# Patient Record
Sex: Female | Born: 1950 | Race: Black or African American | Hispanic: No | Marital: Married | Smoking: Former smoker
Health system: Southern US, Community
[De-identification: ages and names within clinical notes are randomized; demographics above are authoritative.]

## PROBLEM LIST (undated history)

## (undated) DIAGNOSIS — M1990 Unspecified osteoarthritis, unspecified site: Secondary | ICD-10-CM

## (undated) DIAGNOSIS — I82409 Acute embolism and thrombosis of unspecified deep veins of unspecified lower extremity: Secondary | ICD-10-CM

## (undated) DIAGNOSIS — C50919 Malignant neoplasm of unspecified site of unspecified female breast: Secondary | ICD-10-CM

## (undated) DIAGNOSIS — R0600 Dyspnea, unspecified: Secondary | ICD-10-CM

## (undated) DIAGNOSIS — E119 Type 2 diabetes mellitus without complications: Secondary | ICD-10-CM

## (undated) DIAGNOSIS — I10 Essential (primary) hypertension: Secondary | ICD-10-CM

## (undated) DIAGNOSIS — I4892 Unspecified atrial flutter: Secondary | ICD-10-CM

## (undated) DIAGNOSIS — Z8049 Family history of malignant neoplasm of other genital organs: Secondary | ICD-10-CM

## (undated) DIAGNOSIS — Z800 Family history of malignant neoplasm of digestive organs: Secondary | ICD-10-CM

## (undated) DIAGNOSIS — D649 Anemia, unspecified: Secondary | ICD-10-CM

## (undated) HISTORY — DX: Type 2 diabetes mellitus without complications: E11.9

## (undated) HISTORY — PX: TUBAL LIGATION: SHX77

## (undated) HISTORY — PX: CATARACT EXTRACTION W/ INTRAOCULAR LENS IMPLANT: SHX1309

## (undated) HISTORY — DX: Family history of malignant neoplasm of other genital organs: Z80.49

## (undated) HISTORY — DX: Essential (primary) hypertension: I10

## (undated) HISTORY — DX: Family history of malignant neoplasm of digestive organs: Z80.0

---

## 1999-08-18 ENCOUNTER — Encounter: Payer: Self-pay | Admitting: Family Medicine

## 1999-08-18 ENCOUNTER — Ambulatory Visit (HOSPITAL_COMMUNITY): Admission: RE | Admit: 1999-08-18 | Discharge: 1999-08-18 | Payer: Self-pay | Admitting: Family Medicine

## 2001-10-10 ENCOUNTER — Encounter: Payer: Self-pay | Admitting: Family Medicine

## 2001-10-10 ENCOUNTER — Ambulatory Visit (HOSPITAL_COMMUNITY): Admission: RE | Admit: 2001-10-10 | Discharge: 2001-10-10 | Payer: Self-pay | Admitting: Family Medicine

## 2004-10-24 ENCOUNTER — Emergency Department (HOSPITAL_COMMUNITY): Admission: EM | Admit: 2004-10-24 | Discharge: 2004-10-24 | Payer: Self-pay | Admitting: Emergency Medicine

## 2012-12-05 ENCOUNTER — Other Ambulatory Visit (HOSPITAL_COMMUNITY): Payer: Self-pay | Admitting: Family Medicine

## 2012-12-05 ENCOUNTER — Ambulatory Visit (HOSPITAL_COMMUNITY)
Admission: RE | Admit: 2012-12-05 | Discharge: 2012-12-05 | Disposition: A | Discharge status: Home / Self Care | Payer: BC Managed Care – PPO | Source: Ambulatory Visit | Attending: Family Medicine | Admitting: Family Medicine

## 2012-12-05 DIAGNOSIS — R52 Pain, unspecified: Secondary | ICD-10-CM

## 2012-12-05 DIAGNOSIS — M79609 Pain in unspecified limb: Secondary | ICD-10-CM | POA: Insufficient documentation

## 2012-12-05 DIAGNOSIS — M7730 Calcaneal spur, unspecified foot: Secondary | ICD-10-CM | POA: Insufficient documentation

## 2013-09-30 ENCOUNTER — Emergency Department (HOSPITAL_COMMUNITY): Payer: BC Managed Care – PPO

## 2013-09-30 ENCOUNTER — Encounter (HOSPITAL_COMMUNITY): Payer: Self-pay | Admitting: Emergency Medicine

## 2013-09-30 ENCOUNTER — Emergency Department (HOSPITAL_COMMUNITY)
Admission: EM | Admit: 2013-09-30 | Discharge: 2013-09-30 | Disposition: A | Discharge status: Home / Self Care | Payer: BC Managed Care – PPO | Attending: Emergency Medicine | Admitting: Emergency Medicine

## 2013-09-30 DIAGNOSIS — E119 Type 2 diabetes mellitus without complications: Secondary | ICD-10-CM | POA: Insufficient documentation

## 2013-09-30 DIAGNOSIS — R61 Generalized hyperhidrosis: Secondary | ICD-10-CM | POA: Insufficient documentation

## 2013-09-30 DIAGNOSIS — I10 Essential (primary) hypertension: Secondary | ICD-10-CM | POA: Insufficient documentation

## 2013-09-30 DIAGNOSIS — R072 Precordial pain: Secondary | ICD-10-CM | POA: Insufficient documentation

## 2013-09-30 DIAGNOSIS — R079 Chest pain, unspecified: Secondary | ICD-10-CM

## 2013-09-30 DIAGNOSIS — R0602 Shortness of breath: Secondary | ICD-10-CM | POA: Insufficient documentation

## 2013-09-30 LAB — CBC WITH DIFFERENTIAL/PLATELET
Basophils Absolute: 0 10*3/uL (ref 0.0–0.1)
Basophils Relative: 0 % (ref 0–1)
Eosinophils Absolute: 0.1 10*3/uL (ref 0.0–0.7)
Eosinophils Relative: 2 % (ref 0–5)
HCT: 36.6 % (ref 36.0–46.0)
Hemoglobin: 12.4 g/dL (ref 12.0–15.0)
Lymphocytes Relative: 22 % (ref 12–46)
Lymphs Abs: 1.6 10*3/uL (ref 0.7–4.0)
MCH: 28.2 pg (ref 26.0–34.0)
MCHC: 33.9 g/dL (ref 30.0–36.0)
MCV: 83.4 fL (ref 78.0–100.0)
Monocytes Absolute: 0.6 10*3/uL (ref 0.1–1.0)
Monocytes Relative: 9 % (ref 3–12)
NEUTROS ABS: 4.8 10*3/uL (ref 1.7–7.7)
NEUTROS PCT: 67 % (ref 43–77)
Platelets: 255 10*3/uL (ref 150–400)
RBC: 4.39 MIL/uL (ref 3.87–5.11)
RDW: 12.9 % (ref 11.5–15.5)
WBC: 7.2 10*3/uL (ref 4.0–10.5)

## 2013-09-30 LAB — BASIC METABOLIC PANEL
BUN: 27 mg/dL — ABNORMAL HIGH (ref 6–23)
CO2: 29 meq/L (ref 19–32)
Calcium: 9.6 mg/dL (ref 8.4–10.5)
Chloride: 98 mEq/L (ref 96–112)
Creatinine, Ser: 0.67 mg/dL (ref 0.50–1.10)
GFR calc Af Amer: >90 mL/min (ref >90)
GFR calc non Af Amer: >90 mL/min (ref >90)
GLUCOSE: 103 mg/dL — AB (ref 70–99)
Potassium: 3.2 mEq/L — ABNORMAL LOW (ref 3.7–5.3)
Sodium: 140 mEq/L (ref 137–147)

## 2013-09-30 LAB — TROPONIN I: Troponin I: <0.3 ng/mL (ref <0.30)

## 2013-09-30 MED ORDER — CYCLOBENZAPRINE HCL 10 MG PO TABS
10.0000 mg | ORAL_TABLET | Freq: Once | ORAL | Status: AC
  Administered 2013-09-30: 10 mg via ORAL
  Filled 2013-09-30: qty 1

## 2013-09-30 MED ORDER — POTASSIUM CHLORIDE CRYS ER 20 MEQ PO TBCR
20.0000 meq | EXTENDED_RELEASE_TABLET | Freq: Once | ORAL | Status: AC
  Administered 2013-09-30: 20 meq via ORAL
  Filled 2013-09-30: qty 1

## 2013-09-30 MED ORDER — NAPROXEN 500 MG PO TABS: 500.0000 mg | ORAL_TABLET | Freq: Two times a day (BID) | ORAL | Status: DC

## 2013-09-30 MED ORDER — CYCLOBENZAPRINE HCL 10 MG PO TABS: 10.0000 mg | ORAL_TABLET | Freq: Two times a day (BID) | ORAL | Status: DC | PRN

## 2013-09-30 MED ORDER — NAPROXEN 250 MG PO TABS
500.0000 mg | ORAL_TABLET | Freq: Once | ORAL | Status: AC
  Administered 2013-09-30: 500 mg via ORAL
  Filled 2013-09-30: qty 2

## 2013-09-30 NOTE — ED Notes (Signed)
Pt c/o chest pain for past few days.  Says breathing or coughing makes the pain worse.  Pt says thought may have been indigestion because pain is intermittent.  Pt says has been taking antacids and it "eases" the pain a little.  Pt says today pain is constant and is worse if she lays down.  Denies n/v.  Reports sob with exertion and c/o feeling tired lately.

## 2013-09-30 NOTE — Discharge Instructions (Signed)
Medication for pain and muscle spasm. Followup your primary care Dr. this week.

## 2013-09-30 NOTE — ED Provider Notes (Signed)
CSN: [id number]     Arrival date & time 09/30/13  1741 History  This chart was scribed for Crystal Christen, MD by Jenne Campus, ED Scribe. This patient was seen in room APA06/APA06 and the patient's care was started at 7:23 PM.   Chief Complaint  Patient presents with  . Chest Pain     The history is provided by the patient. No language interpreter was used.    [name] is a 63 y.o. female who presents to the Emergency Department complaining of mid sternal CP that radiates straight through to the back intermittently for the past several days. The pain became constant today causing her concern. [name]   PCP is Dr. Karie Kirks.   Past Medical History  Diagnosis Date  . Diabetes mellitus without complication   . Hypertension    History reviewed. No pertinent past surgical history. No family history on file. History  Substance Use Topics  . Smoking status: Never Smoker   . Smokeless tobacco: Not on file  . Alcohol Use: No   No OB history provided.  Review of Systems  A complete 10 system review of systems was obtained and all systems are negative except as noted in the HPI and PMH.    Allergies  Review of patient's allergies indicates no known allergies.  Home Medications   Prior to Admission medications   Not on File   Triage Vitals: BP 102/84  Pulse 97  Temp(Src) 98 F (36.7 C) (Oral)  Resp 20  Ht 5\' 3"  (1.6 m)  Wt 300 lb (136.079 kg)  BMI 53.16 kg/m2  SpO2 100%  Physical Exam  Nursing note and vitals reviewed. Constitutional: She is oriented to person, place, and time. She appears well-developed and well-nourished.  HENT:  Head: Normocephalic and  atraumatic.  Eyes: Conjunctivae and EOM are normal. Pupils are equal, round, and reactive to light.  Neck: Normal range of motion. Neck supple.  Cardiovascular: Normal rate, regular rhythm and normal heart sounds.   Pulmonary/Chest: Effort normal and breath sounds normal. She exhibits tenderness (mild tenderness to the sternum).  Abdominal: Soft. Bowel sounds are normal.  Musculoskeletal: Normal range of motion.  Neurological: She is alert and oriented to person, place, and time.  Skin: Skin is warm and dry.  Psychiatric: She has a normal mood and affect. Her behavior is normal.    ED Course  Procedures (including critical care time)  DIAGNOSTIC STUDIES: Oxygen Saturation is 100% on RA, normal by my interpretation.    COORDINATION OF CARE: 7:25 PM-Discussed treatment plan which includes CXR, CXC, BMP and troponin with pt at bedside and pt agreed to plan.   7:45 PM- Pt noted to by hypokalemic, potassium tablet ordered.   Labs Review Labs Reviewed  BASIC METABOLIC PANEL - Abnormal; Notable for the following:    Potassium 3.2 (*)    Glucose, Bld 103 (*)    BUN 27 (*)    All other components within normal limits  CBC WITH DIFFERENTIAL  TROPONIN I    Imaging Review Dg Chest 2 View  09/30/2013   CLINICAL DATA:  Chest pain.  EXAM: CHEST  2 VIEW  COMPARISON:  10/24/2004.  FINDINGS: The heart is mildly enlarged but stable. There is tortuosity and ectasia of the thoracic aorta. The lungs are clear. Stable mild elevation of the right hemidiaphragm. The bony structures are intact.  IMPRESSION: No acute cardiopulmonary findings.   Electronically Signed   By: Kalman Jewels M.D.   On: 09/30/2013 18:18     EKG Interpretation None      Date: 09/30/2013  Rate: 96  Rhythm: normal sinus rhythm  QRS Axis: normal  Intervals: normal  ST/T Wave abnormalities: normal  Conduction Disutrbances: none  Narrative Interpretation: unremarkable    MDM   Final diagnoses:  Chest pain    screening tests including EKG, troponin, hemoglobin, chest x-ray all negative. Pain is worse with palpation and deep breaths.   Discharge medications Naprosyn 500 mg and Flexeril 10 mg. Patient has primary care followup.  I personally performed the services described in this documentation, which was scribed in my presence. The recorded information has been reviewed and is accurate.      Crystal Christen, MD 09/30/13 2116

## 2013-10-10 ENCOUNTER — Ambulatory Visit (INDEPENDENT_AMBULATORY_CARE_PROVIDER_SITE_OTHER): Payer: BC Managed Care – PPO | Admitting: Cardiology

## 2013-10-10 ENCOUNTER — Encounter: Payer: Self-pay | Admitting: Cardiology

## 2013-10-10 VITALS — BP 124/77 | HR 85 | Ht 63.0 in | Wt 294.1 lb

## 2013-10-10 DIAGNOSIS — E119 Type 2 diabetes mellitus without complications: Secondary | ICD-10-CM | POA: Insufficient documentation

## 2013-10-10 DIAGNOSIS — I10 Essential (primary) hypertension: Secondary | ICD-10-CM | POA: Insufficient documentation

## 2013-10-10 DIAGNOSIS — R0789 Other chest pain: Secondary | ICD-10-CM | POA: Insufficient documentation

## 2013-10-10 NOTE — Assessment & Plan Note (Signed)
Description of symptoms very atypical for ischemic heart disease as etiology. Seems to point more toward an inflammatory etiology. Her ECG is abnormal at baseline, although poor R. progression may be related to body habitus. Plan at this point is to obtain a complete echocardiogram to assess cardiac structure and function as well as rule out pericardial effusion. As her symptoms are resolving on current therapy, will hold off on stress testing for now. This could certainly be considered in the future however if symptoms recur or are more typical - would probably obtain a 2 day Lexiscan Cardiolite in that case.

## 2013-10-10 NOTE — Progress Notes (Signed)
Clinical Summary Ms. Komatsu is a 63 y.o.female referred for cardiology consultation by Dr. Karie Kirks. Record review finds recent ER visits secondary to chest pain - described as being worse when supine and also taking a deep breath. ER note indicates mild tenderness on palpation of the mid sternal area. Cardiac enzymes were negative, arguing against ACS. Chest x-ray demonstrated mild stable cardiomegaly, no acute process. She was discharged, treated with Naprosyn and Flexeril. She states that these medications have helped her symptoms significantly, although they have not yet completely resolved.  Recent ECG in early May showed sinus rhythm with leftward axis and poor R-wave progression, rule out old anterior infarct pattern. Lab work reviewed finding hemoglobin 12.4, platelets 255, potassium 3.2, BUN 27, creatinine 0.67, troponin I less than 0.30.  Further discussion finds occasional reflux symptoms, no frank dysphagia. She reports no exertional chest discomfort or breathlessness. She works at [employer] and is on her feet a lot today.  Followup ECG today shows sinus rhythm with low voltage and poor R-wave progression, rule out old anterior infarct pattern. We discussed her symptoms which are fairly atypical for ischemic heart disease, also her baseline cardiovascular risk factors.   No Known Allergies  Current Outpatient Prescriptions  Medication Sig Dispense Refill  . aspirin 81 MG tablet Take 81 mg by mouth daily.      . cyclobenzaprine (FLEXERIL) 10 MG tablet Take 1 tablet (10 mg total) by mouth 2 (two) times daily as needed for muscle spasms.  20 tablet  0  . hydrochlorothiazide (HYDRODIURIL) 25 MG tablet Take 25 mg by mouth daily.      Marland Kitchen ibuprofen (ADVIL,MOTRIN) 800 MG tablet Take 800 mg by mouth every 8 (eight) hours as needed (for leg pain).      Marland Kitchen losartan (COZAAR) 50 MG tablet Take 50 mg by mouth daily.      . metFORMIN (GLUCOPHAGE) 500 MG tablet Take by mouth 2 (two) times  daily.      . naproxen (NAPROSYN) 500 MG tablet Take 1 tablet (500 mg total) by mouth 2 (two) times daily.  20 tablet  0  . potassium chloride SA (K-DUR,KLOR-CON) 20 MEQ tablet Take 20 mEq by mouth daily.       No current facility-administered medications for this visit.    Past Medical History  Diagnosis Date  . Type 2 diabetes mellitus   . Essential hypertension, benign     Past Surgical History  Procedure Laterality Date  . Tubal ligation      Family History  Problem Relation Age of Onset  . Diabetes Mellitus II Father   . Colon cancer Mother     Social History Ms. Darty reports that she has never smoked. She does not have any smokeless tobacco history on file. Ms. Croghan reports that she does not drink alcohol.  Review of Systems No palpitations, dizziness, syncope. No orthopnea or PND. No claudication. Otherwise as outlined.  Physical Examination Filed Vitals:   10/10/13 0948  BP: 124/77  Pulse: 85   Filed Weights   10/10/13 0948  Weight: 294 lb 1.9 oz (133.412 kg)   Morbidly obese woman, appears comfortable at rest. HEENT: Conjunctiva and lids normal, oropharynx clear. Neck: Supple, no elevated JVP or carotid bruits, no thyromegaly. Lungs: Clear to auscultation, nonlabored breathing at rest. Cardiac: Regular rate and rhythm, no S3 or significant systolic murmur, no pericardial rub. Abdomen: Soft, nontender, bowel sounds present, no guarding or rebound. Extremities: No pitting edema, distal pulses 2+. Skin:  Warm and dry. Musculoskeletal: No kyphosis. Neuropsychiatric: Alert and oriented x3, affect grossly appropriate.   Problem List and Plan   Atypical chest pain Description of symptoms very atypical for ischemic heart disease as etiology. Seems to point more toward an inflammatory etiology. Her ECG is abnormal at baseline, although poor R. progression may be related to body habitus. Plan at this point is to obtain a complete echocardiogram to assess  cardiac structure and function as well as rule out pericardial effusion. As her symptoms are resolving on current therapy, will hold off on stress testing for now. This could certainly be considered in the future however if symptoms recur or are more typical - would probably obtain a 2 day Lexiscan Cardiolite in that case.  Essential hypertension, benign Blood pressure is well-controlled today.  Morbid obesity She states that she is trying to lose weight through portion size control.  Type 2 diabetes mellitus Keep followup with Dr. Karie Kirks.    Satira Sark, M.D., F.A.C.C.

## 2013-10-10 NOTE — Assessment & Plan Note (Signed)
Keep followup with Dr. Karie Kirks.

## 2013-10-10 NOTE — Assessment & Plan Note (Signed)
She states that she is trying to lose weight through portion size control.

## 2013-10-10 NOTE — Patient Instructions (Signed)

## 2013-10-10 NOTE — Assessment & Plan Note (Signed)
Blood pressure is well-controlled today. 

## 2013-10-17 ENCOUNTER — Other Ambulatory Visit: Payer: BC Managed Care – PPO

## 2013-10-26 ENCOUNTER — Other Ambulatory Visit (HOSPITAL_COMMUNITY): Payer: Self-pay | Admitting: *Deleted

## 2013-10-26 DIAGNOSIS — R079 Chest pain, unspecified: Secondary | ICD-10-CM

## 2013-10-31 ENCOUNTER — Other Ambulatory Visit: Payer: Self-pay

## 2013-10-31 ENCOUNTER — Other Ambulatory Visit (INDEPENDENT_AMBULATORY_CARE_PROVIDER_SITE_OTHER): Payer: BC Managed Care – PPO

## 2013-10-31 DIAGNOSIS — R079 Chest pain, unspecified: Secondary | ICD-10-CM

## 2013-11-01 ENCOUNTER — Telehealth: Payer: Self-pay | Admitting: *Deleted

## 2013-11-01 NOTE — Telephone Encounter (Signed)
Patient informed. 

## 2013-11-01 NOTE — Telephone Encounter (Signed)
Message copied by Merlene Laughter on Thu Nov 01, 2013 10:29 AM ------      Message from: MCDOWELL, Aloha Gell      Created: Wed Oct 31, 2013  2:25 PM       Reviewed. Normal LV systolic function, no wall motion abnormalities, and no pericardial effusion. As per recent office visit note, no further cardiac testing planned unless she continues to have chest pain. ------

## 2014-03-15 ENCOUNTER — Emergency Department (HOSPITAL_COMMUNITY): Payer: BC Managed Care – PPO

## 2014-03-15 ENCOUNTER — Emergency Department (HOSPITAL_COMMUNITY)
Admission: EM | Admit: 2014-03-15 | Discharge: 2014-03-16 | Disposition: A | Discharge status: Home / Self Care | Payer: BC Managed Care – PPO | Attending: Emergency Medicine | Admitting: Emergency Medicine

## 2014-03-15 ENCOUNTER — Encounter (HOSPITAL_COMMUNITY): Payer: Self-pay | Admitting: Emergency Medicine

## 2014-03-15 DIAGNOSIS — Z9851 Tubal ligation status: Secondary | ICD-10-CM | POA: Insufficient documentation

## 2014-03-15 DIAGNOSIS — N858 Other specified noninflammatory disorders of uterus: Secondary | ICD-10-CM

## 2014-03-15 DIAGNOSIS — N9489 Other specified conditions associated with female genital organs and menstrual cycle: Secondary | ICD-10-CM | POA: Insufficient documentation

## 2014-03-15 DIAGNOSIS — N939 Abnormal uterine and vaginal bleeding, unspecified: Secondary | ICD-10-CM | POA: Insufficient documentation

## 2014-03-15 DIAGNOSIS — I10 Essential (primary) hypertension: Secondary | ICD-10-CM | POA: Insufficient documentation

## 2014-03-15 DIAGNOSIS — E119 Type 2 diabetes mellitus without complications: Secondary | ICD-10-CM | POA: Insufficient documentation

## 2014-03-15 DIAGNOSIS — Z79899 Other long term (current) drug therapy: Secondary | ICD-10-CM | POA: Insufficient documentation

## 2014-03-15 LAB — CBC WITH DIFFERENTIAL/PLATELET
Basophils Absolute: 0 K/uL (ref 0.0–0.1)
Basophils Relative: 0 % (ref 0–1)
Eosinophils Absolute: 0.1 K/uL (ref 0.0–0.7)
Eosinophils Relative: 1 % (ref 0–5)
HCT: 34 % — ABNORMAL LOW (ref 36.0–46.0)
Hemoglobin: 11.6 g/dL — ABNORMAL LOW (ref 12.0–15.0)
Lymphocytes Relative: 14 % (ref 12–46)
Lymphs Abs: 1.6 K/uL (ref 0.7–4.0)
MCH: 28.8 pg (ref 26.0–34.0)
MCHC: 34.1 g/dL (ref 30.0–36.0)
MCV: 84.4 fL (ref 78.0–100.0)
Monocytes Absolute: 0.8 K/uL (ref 0.1–1.0)
Monocytes Relative: 7 % (ref 3–12)
Neutro Abs: 8.8 K/uL — ABNORMAL HIGH (ref 1.7–7.7)
Neutrophils Relative %: 78 % — ABNORMAL HIGH (ref 43–77)
Platelets: 239 K/uL (ref 150–400)
RBC: 4.03 MIL/uL (ref 3.87–5.11)
RDW: 13.2 % (ref 11.5–15.5)
WBC: 11.3 K/uL — ABNORMAL HIGH (ref 4.0–10.5)

## 2014-03-15 LAB — COMPREHENSIVE METABOLIC PANEL
ALK PHOS: 98 U/L (ref 39–117)
ALT: 13 U/L (ref 0–35)
AST: 15 U/L (ref 0–37)
Albumin: 3.8 g/dL (ref 3.5–5.2)
Anion gap: 14 (ref 5–15)
BILIRUBIN TOTAL: 0.3 mg/dL (ref 0.3–1.2)
BUN: 21 mg/dL (ref 6–23)
CHLORIDE: 101 meq/L (ref 96–112)
CO2: 27 meq/L (ref 19–32)
Calcium: 9.6 mg/dL (ref 8.4–10.5)
Creatinine, Ser: 0.75 mg/dL (ref 0.50–1.10)
GFR, EST NON AFRICAN AMERICAN: 89 mL/min — AB (ref >90)
GLUCOSE: 128 mg/dL — AB (ref 70–99)
Potassium: 3.4 mEq/L — ABNORMAL LOW (ref 3.7–5.3)
SODIUM: 142 meq/L (ref 137–147)
Total Protein: 8.1 g/dL (ref 6.0–8.3)

## 2014-03-15 LAB — WET PREP, GENITAL
Clue Cells Wet Prep HPF POC: NONE SEEN
Trich, Wet Prep: NONE SEEN
Yeast Wet Prep HPF POC: NONE SEEN

## 2014-03-15 LAB — LIPASE, BLOOD: Lipase: 39 U/L (ref 11–59)

## 2014-03-15 IMAGING — CT CT ABD-PELV W/ CM
2 of 5 series · 16 of 46 positions shown, 18 images · IV contrast (Omnipaque 300)
Comparison: None.

CLINICAL DATA: Initial evaluation for severe low abdominal and
pelvic pain, cramps like menstrual pain for 2 weeks worse today,
also suprapubic pain began [9M] today with heavy vaginal bleeding

EXAM:
CT ABDOMEN AND PELVIS WITH CONTRAST
TECHNIQUE: Multidetector CT imaging of the abdomen and pelvis was performed
using the standard protocol following bolus administration of
intravenous contrast.
CONTRAST:  50mL OMNIPAQUE IOHEXOL 300 MG/ML SOLN, 100mL OMNIPAQUE
IOHEXOL 300 MG/ML SOLN

[Series 2: abd_pel_with 5.0 b40f · axial · 0.95mm/px · z∈[+608,+1048]mm · 13 of 100 slices shown, 15 images]
[im 6/100  soft-tissue]
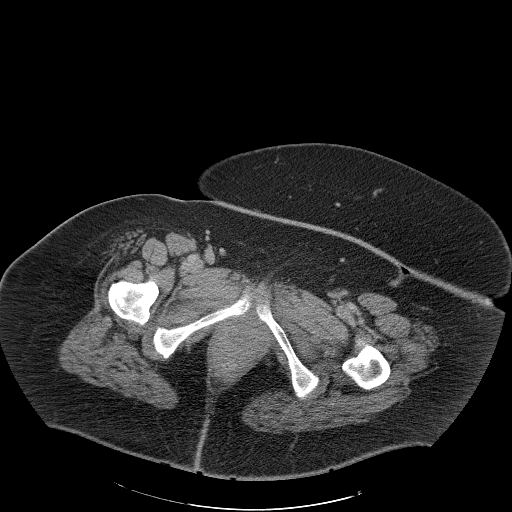
[im 6/100  bone]
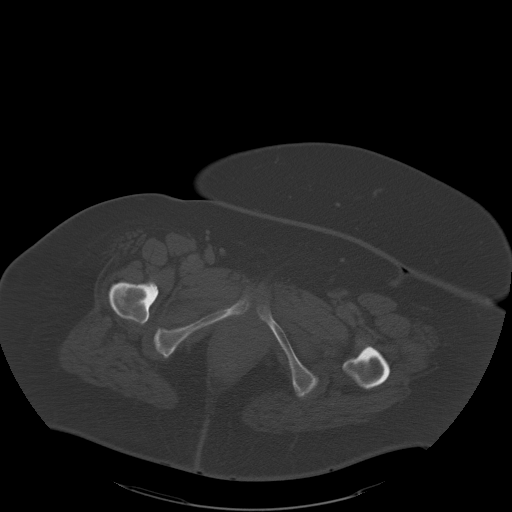
[im 12/100  soft-tissue]
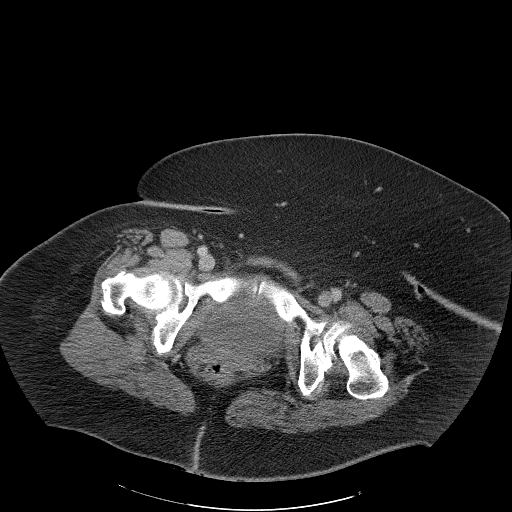
[im 24/100  soft-tissue]
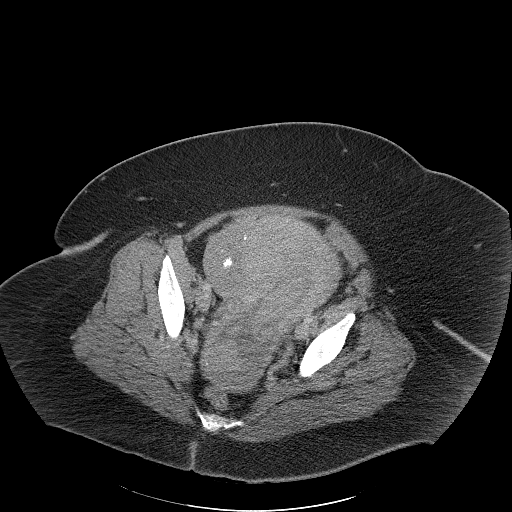
[im 30/100  soft-tissue]
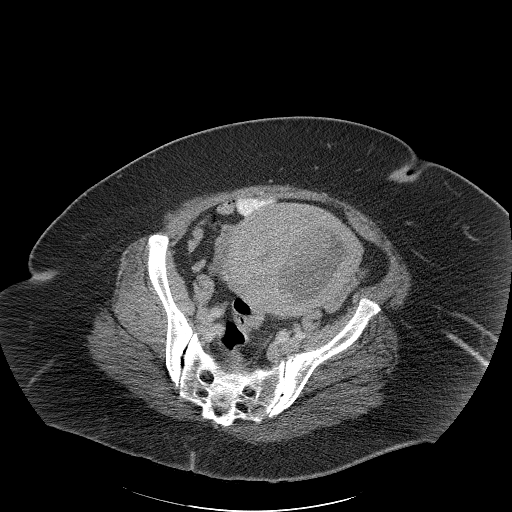
[im 35/100  soft-tissue]
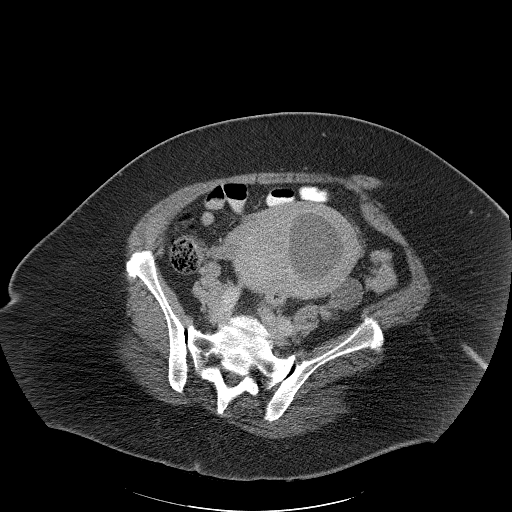
[im 41/100  soft-tissue]
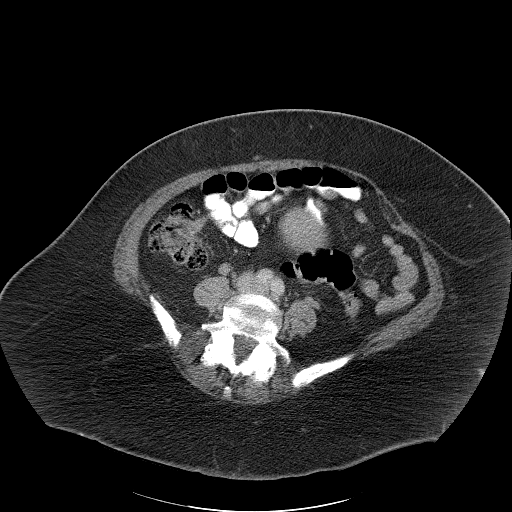
[im 53/100  soft-tissue]
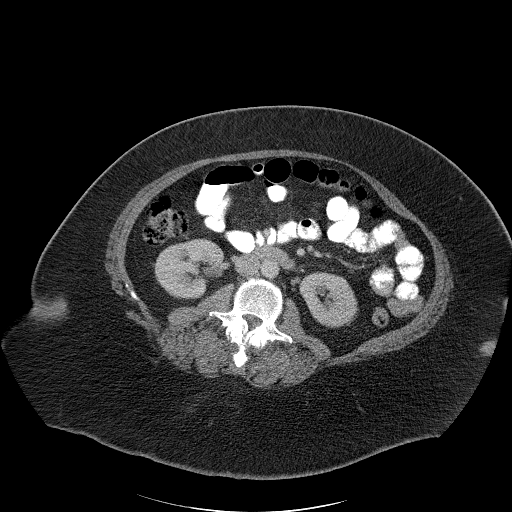
[im 59/100  soft-tissue]
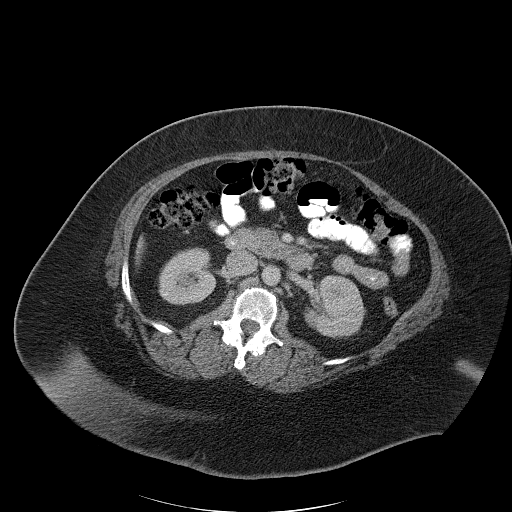
[im 65/100  soft-tissue]
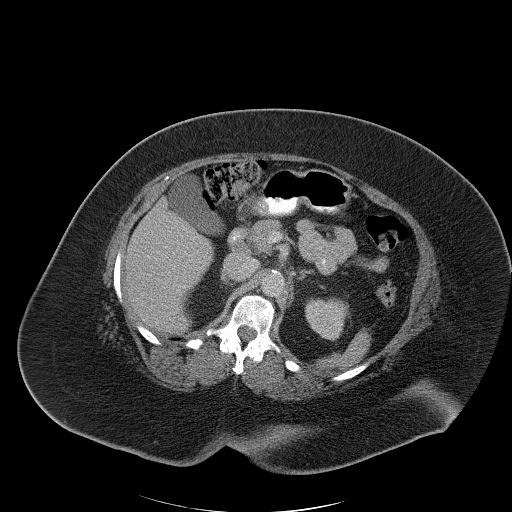
[im 65/100  bone]
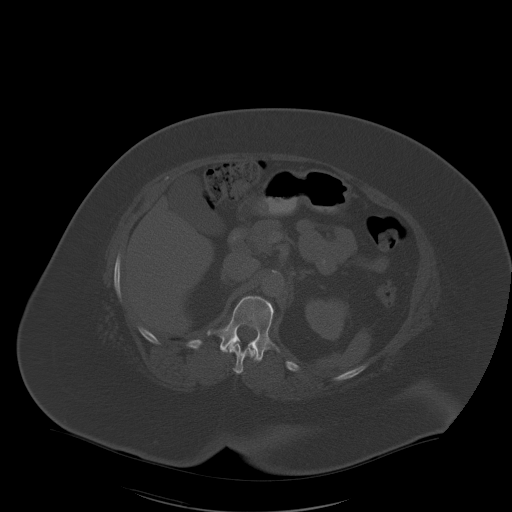
[im 70/100  soft-tissue]
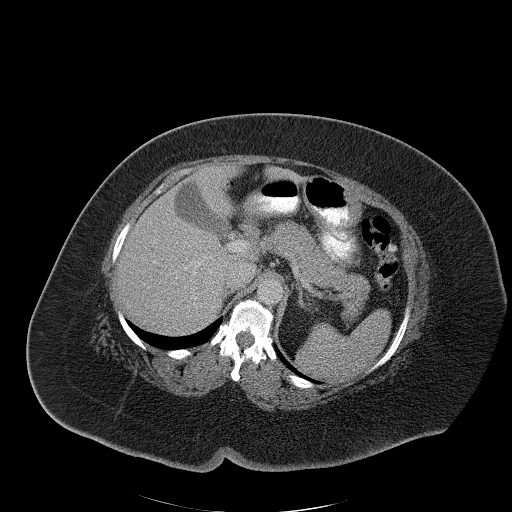
[im 76/100  soft-tissue]
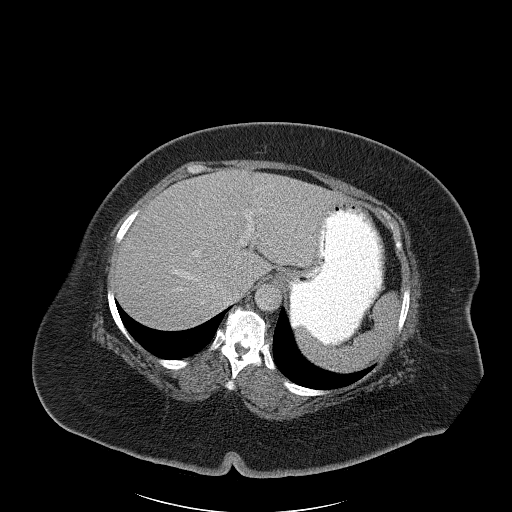
[im 88/100  soft-tissue]
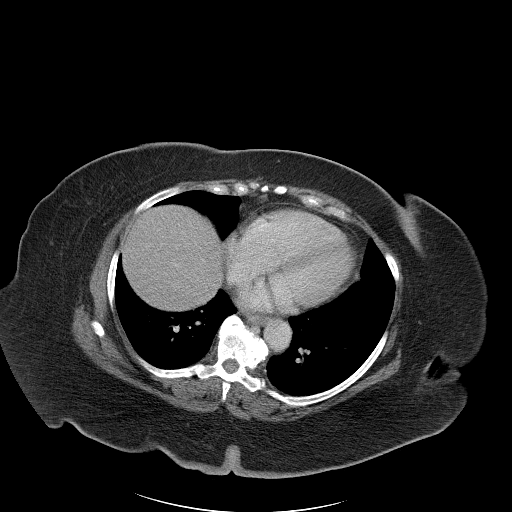
[im 94/100  soft-tissue]
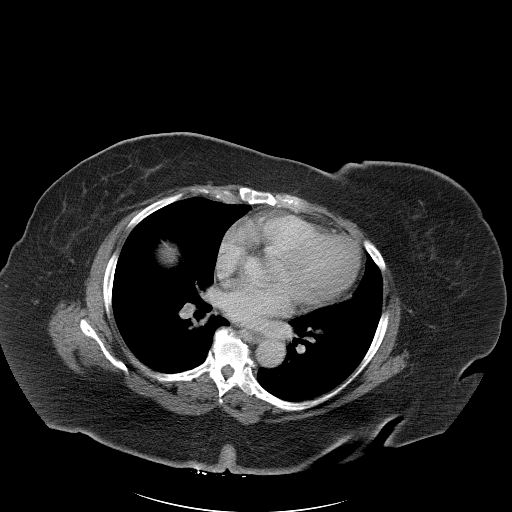

[Series 4: abd_pel_with 3.0 spo · coronal · 0.85mm/px · 3 of 111 slices shown]
[im 37/111  soft-tissue]
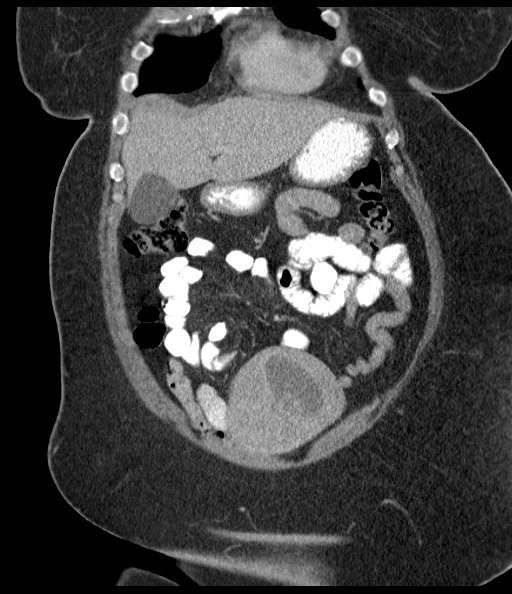
[im 49/111  soft-tissue]
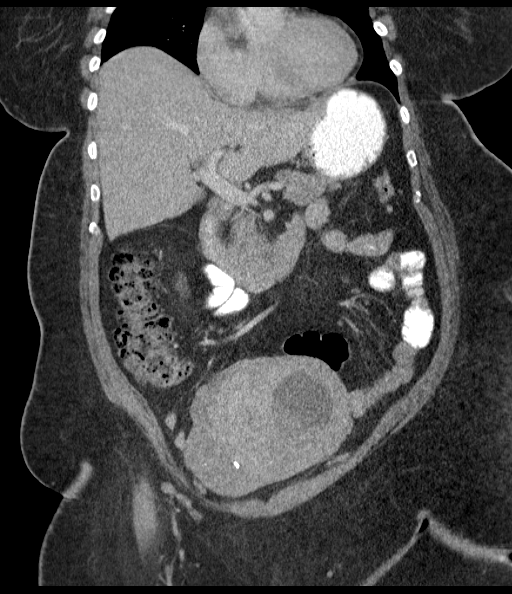
[im 62/111  soft-tissue]
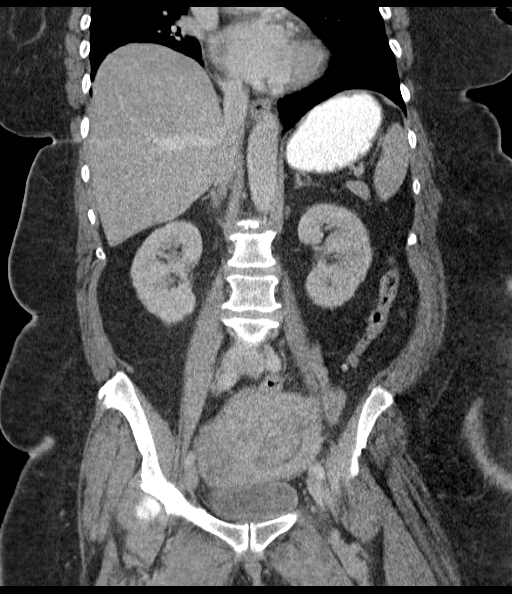

[16 of 46 positions shown; findings below may reference images not displayed]

FINDINGS: Visualized portions of the lung bases are clear. Mild left-sided
cardiac enlargement.

Liver is normal. Gallbladder is normal. Spleen is normal. Pancreas
is normal. Adrenal glands are normal. Kidneys are normal.

Mild calcification of the abdominal aorta without dilatation.
Stomach, small bowel, and large bowel are normal. Appendix is
normal.

Bladder is normal. The uterus is enlarged to 17 x 12 x 21 mm. There
is a heterogeneous mass in the fundus. The endometrium it appears
significantly distended to about 5 cm with blood within the
endometrial canal. Alternatively this could represent a large cystic
mass. There are numerous uterine calcifications.

There is no free fluid in the pelvis. There are no acute
musculoskeletal findings. There is moderate right and severe left
hip arthritis. There are degenerative changes involving the
sacroiliac joints and the lumbar spine.
IMPRESSION: Markedly enlarged and heterogeneous uterus consistent with diffuse
leiomyomatous involvement. There is either significant enlargement
of the endometrial canal with fluid and hemorrhage, versus a large
it cystic mass extending nearly the entire length of the uterus. I
would recommend a transabdominal and transvaginal pelvic ultrasound
to further characterize.

## 2014-03-15 MED ORDER — MORPHINE SULFATE 4 MG/ML IJ SOLN
4.0000 mg | Freq: Once | INTRAMUSCULAR | Status: AC
  Administered 2014-03-15: 4 mg via INTRAVENOUS
  Filled 2014-03-15: qty 1

## 2014-03-15 MED ORDER — ONDANSETRON HCL 4 MG/2ML IJ SOLN
4.0000 mg | Freq: Once | INTRAMUSCULAR | Status: AC
  Administered 2014-03-15: 4 mg via INTRAVENOUS
  Filled 2014-03-15: qty 2

## 2014-03-15 MED ORDER — HYDROMORPHONE HCL 1 MG/ML IJ SOLN
0.5000 mg | Freq: Once | INTRAMUSCULAR | Status: AC
  Administered 2014-03-15: 0.5 mg via INTRAVENOUS
  Filled 2014-03-15: qty 1

## 2014-03-15 MED ORDER — MEGESTROL ACETATE 20 MG PO TABS: ORAL_TABLET | ORAL | Status: DC

## 2014-03-15 MED ORDER — OXYCODONE-ACETAMINOPHEN 5-325 MG PO TABS: 2.0000 | ORAL_TABLET | ORAL | Status: DC | PRN

## 2014-03-15 MED ORDER — SODIUM CHLORIDE 0.9 % IV BOLUS (SEPSIS)
1000.0000 mL | Freq: Once | INTRAVENOUS | Status: AC
  Administered 2014-03-15: 1000 mL via INTRAVENOUS

## 2014-03-15 MED ORDER — IOHEXOL 300 MG/ML  SOLN
50.0000 mL | Freq: Once | INTRAMUSCULAR | Status: AC | PRN
  Administered 2014-03-15: 50 mL via ORAL

## 2014-03-15 MED ORDER — IOHEXOL 300 MG/ML  SOLN
100.0000 mL | Freq: Once | INTRAMUSCULAR | Status: AC | PRN
  Administered 2014-03-15: 100 mL via INTRAVENOUS

## 2014-03-15 MED ORDER — ONDANSETRON HCL 4 MG PO TABS: 4.0000 mg | ORAL_TABLET | Freq: Four times a day (QID) | ORAL | Status: DC

## 2014-03-15 NOTE — ED Notes (Signed)
MD at bedside. 

## 2014-03-15 NOTE — ED Provider Notes (Signed)
CSN: [id number]     Arrival date & time 03/15/14  1834 History   First MD Initiated Contact with Patient 03/15/14 1957    This chart was scribed for Ezequiel Essex, MD by Terressa Koyanagi, ED Scribe. This patient was seen in room APA07/APA07 and the patient's care was started at 8:02 PM.  Chief Complaint  Patient presents with  . Abdominal Pain   HPI [name] is a 63 y.o. female, with medical Hx significant for DMTII and HTN, who presents to the Emergency Department complaining of intermittent, acute lower abd pain (onset 2 weeks ago) with associated vaginal bleeding/ spotting (onset this past September). Pt notes, however, today's abd pain started at 3:30PM and has been constant. Pt further states that her abd pain today is worse than any prior episodes. Pt equates her abd pain to severe period cramps. Pt rates her current pain a 10 out of 10. Pt reports taking ibuprofen today without relief. Pt also repots that she had spotting today. Pt denies dysuria, fevers, chills, back pain, dizziness, n/v/d, SOB, chest pain, constipation, or lightheadedness.   Past Medical History  Diagnosis Date  . Type 2 diabetes mellitus   . Essential hypertension, benign    Past Surgical History  Procedure Laterality Date  . Tubal ligation     Family History  Problem Relation Age of Onset  . Diabetes Mellitus II Father   . Colon cancer Mother    History  Substance Use Topics  . Smoking status: Never Smoker   . Smokeless tobacco: Not on file  . Alcohol Use: No   OB History   Grav Para Term Preterm Abortions TAB SAB Ect Mult Living                 Review of Systems  A complete 10 system review of systems was obtained and all systems are negative except as noted in the HPI and PMH.    Allergies  Review of patient's allergies indicates no known allergies.  Home Medications   Prior to Admission medications   Medication Sig Start Date End Date Taking? Authorizing Provider   hydrochlorothiazide (HYDRODIURIL) 25 MG tablet Take 25 mg by mouth daily.   Yes Historical Provider, MD  ibuprofen (ADVIL,MOTRIN) 800 MG tablet Take 800 mg by mouth every 8 (eight) hours as needed (for leg pain).   Yes Historical Provider, MD  losartan (COZAAR) 50 MG tablet Take 50 mg by mouth daily.   Yes Historical Provider, MD  metFORMIN (GLUCOPHAGE) 500 MG tablet Take 500 mg by mouth 2 (two) times daily.    Yes Historical Provider, MD  megestrol (MEGACE) 20 MG tablet Take two tablets (40 mg) three times per day time three days,  then take two tablets (40 mg) two times per day time three days,  then take two tablets (40 mg)once per day 03/15/14   Ashley Murrain, NP  ondansetron (ZOFRAN) 4 MG tablet Take 1 tablet (4 mg total) by mouth every 6 (six) hours. 03/15/14   Ezequiel Essex, MD  oxyCODONE-acetaminophen (PERCOCET/ROXICET) 5-325 MG per tablet Take 2 tablets by mouth every 4 (four) hours as needed for severe pain. 03/15/14   Ezequiel Essex, MD   Triage Vitals: BP 146/86  Pulse 110  Temp(Src) 98.3 F (36.8 C) (Oral)  Resp 18  Ht 5\' 3"  (1.6 m)  Wt 297 lb 11.2 oz (135.036 kg)  BMI 52.75 kg/m2  SpO2 100% Physical Exam  Nursing note and vitals reviewed. Constitutional: She  is oriented to person, place, and time. She appears well-developed and well-nourished. No distress.  Uncomfortable, difficult exam due to body habitus   HENT:  Head: Normocephalic and atraumatic.  Mouth/Throat: Oropharynx is clear and moist. No oropharyngeal exudate.  Eyes: Conjunctivae and EOM are normal. Pupils are equal, round, and reactive to light.  Neck: Normal range of motion. Neck supple.  No meningismus.  Cardiovascular: Normal rate, regular rhythm, normal heart sounds and intact distal pulses.   No murmur heard. Pulmonary/Chest: Effort normal and breath sounds normal. No respiratory distress.  Abdominal: Soft. There is tenderness. There is no rebound and no guarding.  Tender suprapubically under  pannus. No CVA tenderness.   Musculoskeletal: Normal range of motion. She exhibits no edema and no tenderness.  Neurological: She is alert and oriented to person, place, and time. No cranial nerve deficit. She exhibits normal muscle tone. Coordination normal.  No ataxia on finger to nose bilaterally. No pronator drift. 5/5 strength throughout. CN 2-12 intact. Negative Romberg. Equal grip strength. Sensation intact. Gait is normal.   Skin: Skin is warm.  Psychiatric: She has a normal mood and affect. Her behavior is normal.   Exam performed by Ezequiel Essex, MD,  exam chaperoned Date: 03/15/2014 Pelvic exam: normal external genitalia without evidence of trauma. VULVA: normal appearing vulva with no masses, tenderness or lesion. VAGINA: normal appearing vagina with normal color and discharge, no lesions. Dark blood in vaginal vault.  CERVIX: normal appearing cervix without lesions, cervical motion tenderness absent, cervical os closed with out purulent discharge; Wet prep and DNA probe for chlamydia and GC obtained.  ADNEXA: normal adnexa in size, nontender and no masses.   Rectal Exam:  Exam performed by Ezequiel Essex, MD,  exam chaperoned Date: 03/15/2014 There are no external fissures noted. No induration of the skin or swelling. No external hemorrhoids seen. Patient able to tolerate examination. I was able to feel the first 2-3cm of the rectum digitally without gross abnormality. There is gross blood.  NO signs of perirectal abscess. Likely Contamination from vaginal bleeding.     ED Course  Procedures (including critical care time) DIAGNOSTIC STUDIES: Oxygen Saturation is 100% on RA, nl by my interpretation.    COORDINATION OF CARE: 8:11 PM-Discussed treatment plan which includes UA, meds, imaging, pelvic exam, rectal exam and labs with pt at bedside and pt agreed to plan.  10:37 PM: Recheck, spoke with pt about getting an ultrasound on Monday with Dr. Elonda Husky and starting Megace. Pt  requests meds for continued pain.  Labs Review Labs Reviewed  WET PREP, GENITAL - Abnormal; Notable for the following:    WBC, Wet Prep HPF POC FEW (*)    All other components within normal limits  CBC WITH DIFFERENTIAL - Abnormal; Notable for the following:    WBC 11.3 (*)    Hemoglobin 11.6 (*)    HCT 34.0 (*)    Neutrophils Relative % 78 (*)    Neutro Abs 8.8 (*)    All other components within normal limits  COMPREHENSIVE METABOLIC PANEL - Abnormal; Notable for the following:    Potassium 3.4 (*)    Glucose, Bld 128 (*)    GFR calc non Af Amer 89 (*)    All other components within normal limits  GC/CHLAMYDIA PROBE AMP  LIPASE, BLOOD  URINALYSIS, ROUTINE W REFLEX MICROSCOPIC  POC OCCULT BLOOD, ED    Imaging Review Ct Abdomen Pelvis W Contrast  03/15/2014   CLINICAL DATA:  Initial evaluation for severe  low abdominal and pelvic pain, cramps like menstrual pain for 2 weeks worse today, also suprapubic pain began 330pm today with heavy vaginal bleeding  EXAM: CT ABDOMEN AND PELVIS WITH CONTRAST  TECHNIQUE: Multidetector CT imaging of the abdomen and pelvis was performed using the standard protocol following bolus administration of intravenous contrast.  CONTRAST:  31mL OMNIPAQUE IOHEXOL 300 MG/ML SOLN, 110mL OMNIPAQUE IOHEXOL 300 MG/ML SOLN  COMPARISON:  None.  FINDINGS: Visualized portions of the lung bases are clear. Mild left-sided cardiac enlargement.  Liver is normal. Gallbladder is normal. Spleen is normal. Pancreas is normal. Adrenal glands are normal. Kidneys are normal.  Mild calcification of the abdominal aorta without dilatation. Stomach, small bowel, and large bowel are normal. Appendix is normal.  Bladder is normal. The uterus is enlarged to 17 x 12 x 21 mm. There is a heterogeneous mass in the fundus. The endometrium it appears significantly distended to about 5 cm with blood within the endometrial canal. Alternatively this could represent a large cystic mass. There are  numerous uterine calcifications.  There is no free fluid in the pelvis. There are no acute musculoskeletal findings. 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 There are degenerative changes involving the sacroiliac joints and the lumbar spine.  IMPRESSION: Markedly enlarged and heterogeneous uterus consistent with diffuse leiomyomatous involvement. There is either significant enlargement of the endometrial canal with fluid and hemorrhage, versus a large it cystic mass extending nearly the entire length of the uterus. I would recommend a transabdominal and transvaginal pelvic ultrasound to further characterize.   Electronically Signed   By: Skipper Cliche M.D.   On: 03/15/2014 22:10     EKG Interpretation None      MDM   Final diagnoses:  Uterine mass  Vaginal bleeding   Crampy lower abdominal pain with vaginal bleeding intermittently for the past 2 weeks. Became sharp and severe around 3 PM today. He endorses some vaginal spotting today. No vomiting no chest pain or shortness of breath. Denies any blood in his stools.  Exam is remarkable for suprapubic tenderness under pannus. Difficult exam due to body habitus.  No pain at McBurney's point. There is gross blood in the vaginal vault. Rectal exam likely contaminated by vaginal blood.  Hemoglobin stable at 11.6 CT findings as above. Discussed with Dr. Elonda Husky of gynecology. He recommends starting patient on Megace and he will see her Monday. Unable to get ultrasound today. He will obtain this in the office on Monday.  Patient's HR has improved to 90s and her pain is controlled. Hemoglobin stable.  Orthostatics negative. Will discharge with pain medications, megace, follow up with GYN. Return to the ED with worsening pain, bleeding, dizziness, chest pain or any other concerns.   BP 147/96  Pulse 103  Temp(Src) 98.3 F (36.8 C) (Oral)  Resp 20  Ht 5\' 3"  (1.6 m)  Wt 297 lb 11.2 oz (135.036 kg)  BMI 52.75 kg/m2  SpO2  99%   nally performed the services described in this documentation, which was scribed in my presence. The recorded information has been reviewed and is accurate.   Ezequiel Essex, MD 03/16/14 0230

## 2014-03-15 NOTE — ED Notes (Signed)
Pt reports lower abdominal pain and "vaginal bleeding." x2 weeks. Pt reports "the bleeding comes and goes.": pt reports pain is intermittent as well. Pt denies any dizziness, n/v/d.

## 2014-03-15 NOTE — Discharge Instructions (Signed)
Pelvic Pain Follow up with Dr. Elonda Husky on Monday for an ultrasound. Return to the ED if you develop worsening pain, bleeding, chest pain, dizziness, or any other concerns. Female pelvic pain can be caused by many different things and start from a variety of places. Pelvic pain refers to pain that is located in the lower half of the abdomen and between your hips. The pain may occur over a short period of time (acute) or may be reoccurring (chronic). The cause of pelvic pain may be related to disorders affecting the female reproductive organs (gynecologic), but it may also be related to the bladder, kidney stones, an intestinal complication, or muscle or skeletal problems. Getting help right away for pelvic pain is important, especially if there has been severe, sharp, or a sudden onset of unusual pain. It is also important to get help right away because some types of pelvic pain can be life threatening.  CAUSES  Below are only some of the causes of pelvic pain. The causes of pelvic pain can be in one of several categories.   Gynecologic.  Pelvic inflammatory disease.  Sexually transmitted infection.  Ovarian cyst or a twisted ovarian ligament (ovarian torsion).  Uterine lining that grows outside the uterus (endometriosis).  Fibroids, cysts, or tumors.  Ovulation.  Pregnancy.  Pregnancy that occurs outside the uterus (ectopic pregnancy).  Miscarriage.  Labor.  Abruption of the placenta or ruptured uterus.  Infection.  Uterine infection (endometritis).  Bladder infection.  Diverticulitis.  Miscarriage related to a uterine infection (septic abortion).  Bladder.  Inflammation of the bladder (cystitis).  Kidney stone(s).  Gastrointestinal.  Constipation.  Diverticulitis.  Neurologic.  Trauma.  Feeling pelvic pain because of mental or emotional causes (psychosomatic).  Cancers of the bowel or pelvis. EVALUATION  Your caregiver will want to take a careful history of  your concerns. This includes recent changes in your health, a careful gynecologic history of your periods (menses), and a sexual history. Obtaining your family history and medical history is also important. Your caregiver may suggest a pelvic exam. A pelvic exam will help identify the location and severity of the pain. It also helps in the evaluation of which organ system may be involved. In order to identify the cause of the pelvic pain and be properly treated, your caregiver may order tests. These tests may include:   A pregnancy test.  Pelvic ultrasonography.  An X-ray exam of the abdomen.  A urinalysis or evaluation of vaginal discharge.  Blood tests. HOME CARE INSTRUCTIONS   Only take over-the-counter or prescription medicines for pain, discomfort, or fever as directed by your caregiver.   Rest as directed by your caregiver.   Eat a balanced diet.   Drink enough fluids to make your urine clear or pale yellow, or as directed.   Avoid sexual intercourse if it causes pain.   Apply warm or cold compresses to the lower abdomen depending on which one helps the pain.   Avoid stressful situations.   Keep a journal of your pelvic pain. Write down when it started, where the pain is located, and if there are things that seem to be associated with the pain, such as food or your menstrual cycle.  Follow up with your caregiver as directed.  SEEK MEDICAL CARE IF:  Your medicine does not help your pain.  You have abnormal vaginal discharge. SEEK IMMEDIATE MEDICAL CARE IF:   You have heavy bleeding from the vagina.   Your pelvic pain increases.   You  feel light-headed or faint.   You have chills.   You have pain with urination or blood in your urine.   You have uncontrolled diarrhea or vomiting.   You have a fever or persistent symptoms for more than 3 days.  You have a fever and your symptoms suddenly get worse.   You are being physically or sexually abused.   MAKE SURE YOU:  Understand these instructions.  Will watch your condition.  Will get help if you are not doing well or get worse. Document Released: 04/13/2004 Document Revised: 10/01/2013 Document Reviewed: 09/06/2011 [facility] Patient Information 2015 [place], [place]. This information is not intended to replace advice given to you by your health care provider. Make sure you discuss any questions you have with your health care provider.

## 2014-03-18 LAB — GC/CHLAMYDIA PROBE AMP
CT PROBE, AMP APTIMA: NEGATIVE
GC PROBE AMP APTIMA: NEGATIVE

## 2014-03-18 LAB — POC OCCULT BLOOD, ED: FECAL OCCULT BLD: POSITIVE — AB

## 2014-04-08 ENCOUNTER — Encounter: Payer: Self-pay | Admitting: Obstetrics & Gynecology

## 2014-04-08 ENCOUNTER — Ambulatory Visit (INDEPENDENT_AMBULATORY_CARE_PROVIDER_SITE_OTHER): Payer: Self-pay | Admitting: Obstetrics & Gynecology

## 2014-04-08 ENCOUNTER — Other Ambulatory Visit: Payer: Self-pay | Admitting: Obstetrics & Gynecology

## 2014-04-08 VITALS — BP 140/90 | Wt 287.0 lb

## 2014-04-08 DIAGNOSIS — N840 Polyp of corpus uteri: Secondary | ICD-10-CM

## 2014-04-16 ENCOUNTER — Ambulatory Visit (INDEPENDENT_AMBULATORY_CARE_PROVIDER_SITE_OTHER): Payer: Self-pay | Admitting: Obstetrics & Gynecology

## 2014-04-16 ENCOUNTER — Encounter: Payer: Self-pay | Admitting: Obstetrics & Gynecology

## 2014-04-16 VITALS — BP 130/80 | Wt 290.0 lb

## 2014-04-16 DIAGNOSIS — N8501 Benign endometrial hyperplasia: Secondary | ICD-10-CM | POA: Insufficient documentation

## 2014-04-16 MED ORDER — MEGESTROL ACETATE 40 MG PO TABS: 40.0000 mg | ORAL_TABLET | Freq: Every day | ORAL | Status: DC

## 2014-04-16 NOTE — Progress Notes (Signed)
Patient ID: Crystal Vega, female   DOB: Sep 06, 1950, 62 y.o.   MRN: [id number] Endometrial biopsy revealed simple cystic hyperplasia of an endometrial polyp  Will procced with hysteroscopy D&C with removal of polyp 06/05/2014  Megace until then

## 2014-05-28 ENCOUNTER — Other Ambulatory Visit: Payer: Self-pay | Admitting: Obstetrics & Gynecology

## 2014-05-28 NOTE — Patient Instructions (Signed)
Crystal Vega  05/28/2014   Your procedure is scheduled on:  06/05/2014  Report to [facility] at 8:30 AM.  Call this number if you have problems the morning of surgery: [phone]   Remember:   Do not eat food or drink liquids after midnight.   Take these medicines the morning of surgery with A SIP OF WATER: Losartan, Megace, Zofran, Oxycodone      Do not wear jewelry, make-up or nail polish.  Do not wear lotions, powders, or perfumes. You may wear deodorant.  Do not shave 48 hours prior to surgery. Men may shave face and neck.  Do not bring valuables to the hospital.  [facility] is not responsible for any belongings or valuables.               Contacts, dentures or bridgework may not be worn into surgery.  Leave suitcase in the car. After surgery it may be brought to your room.  For patients admitted to the hospital, discharge time is determined by your treatment team.               Patients discharged the day of surgery will not be allowed to drive home.  Name and phone number of your driver:   Special Instructions: Shower using CHG 2 nights before surgery and the night before surgery.  If you shower the day of surgery use CHG.  Use special wash - you have one bottle of CHG for all showers.  You should use approximately 1/3 of the bottle for each shower.   Please read over the following fact sheets that you were given: MRSA Information and Surgical Site Infection Prevention   PATIENT INSTRUCTIONS POST-ANESTHESIA  IMMEDIATELY FOLLOWING SURGERY:  Do not drive or operate machinery for the first twenty four hours after surgery.  Do not make any important decisions for twenty four hours after surgery or while taking narcotic pain medications or sedatives.  If you develop intractable nausea and vomiting or a severe headache please notify your doctor immediately.  FOLLOW-UP:  Please make an appointment with your surgeon as instructed. You do not need to follow up with anesthesia  unless specifically instructed to do so.  WOUND CARE INSTRUCTIONS (if applicable):  Keep a dry clean dressing on the anesthesia/puncture wound site if there is drainage.  Once the wound has quit draining you may leave it open to air.  Generally you should leave the bandage intact for twenty four hours unless there is drainage.  If the epidural site drains for more than 36-48 hours please call the anesthesia department.  QUESTIONS?:  Please feel free to call your physician or the hospital operator if you have any questions, and they will be happy to assist you.      Dilation and Curettage or Vacuum Curettage Dilation and curettage (D&C) and vacuum curettage are minor procedures. A D&C involves stretching (dilation) the cervix and scraping (curettage) the inside lining of the womb (uterus). During a D&C, tissue is gently scraped from the inside lining of the uterus. During a vacuum curettage, the lining and tissue in the uterus are removed with the use of gentle suction.  Curettage may be performed to either diagnose or treat a problem. As a diagnostic procedure, curettage is performed to examine tissues from the uterus. A diagnostic curettage may be performed for the following symptoms:   Irregular bleeding in the uterus.   Bleeding with the development of clots.   Spotting between menstrual periods.  Prolonged menstrual periods.   Bleeding after menopause.   No menstrual period (amenorrhea).   A change in size and shape of the uterus.  As a treatment procedure, curettage may be performed for the following reasons:   Removal of an IUD (intrauterine device).   Removal of retained placenta after giving birth. Retained placenta can cause an infection or bleeding severe enough to require transfusions.   Abortion.   Miscarriage.   Removal of polyps inside the uterus.   Removal of uncommon types of noncancerous lumps (fibroids).  LET [facility] CARE PROVIDER KNOW ABOUT:    Any allergies you have.   All medicines you are taking, including vitamins, herbs, eye drops, creams, and over-the-counter medicines.   Previous problems you or members of your family have had with the use of anesthetics.   Any blood disorders you have.   Previous surgeries you have had.   Medical conditions you have. RISKS AND COMPLICATIONS  Generally, this is a safe procedure. However, as with any procedure, complications can occur. Possible complications include:  Excessive bleeding.   Infection of the uterus.   Damage to the cervix.   Development of scar tissue (adhesions) inside the uterus, later causing abnormal amounts of menstrual bleeding.   Complications from the general anesthetic, if a general anesthetic is used.   Putting a hole (perforation) in the uterus. This is rare.  BEFORE THE PROCEDURE   Eat and drink before the procedure only as directed by your health care provider.   Arrange for someone to take you home.  PROCEDURE  This procedure usually takes about 15-30 minutes.  You will be given one of the following:  A medicine that numbs the area in and around the cervix (local anesthetic).   A medicine to make you sleep through the procedure (general anesthetic).  You will lie on your back with your legs in stirrups.   A warm metal or plastic instrument (speculum) will be placed in your vagina to keep it open and to allow the health care provider to see the cervix.  There are two ways in which your cervix can be softened and dilated. These include:   Taking a medicine.   Having thin rods (laminaria) inserted into your cervix.   A curved tool (curette) will be used to scrape cells from the inside lining of the uterus. In some cases, gentle suction is applied with the curette. The curette will then be removed.  AFTER THE PROCEDURE   You will rest in the recovery area until you are stable and are ready to go home.   You may feel  sick to your stomach (nauseous) or throw up (vomit) if you were given a general anesthetic.   You may have a sore throat if a tube was placed in your throat during general anesthesia.   You may have light cramping and bleeding. This may last for 2 days to 2 weeks after the procedure.   Your uterus needs to make a new lining after the procedure. This may make your next period late. Document Released: 05/17/2005 Document Revised: 01/17/2013 Document Reviewed: 12/14/2012 [facility] Patient Information 2015 [place], [place]. This information is not intended to replace advice given to you by your health care provider. Make sure you discuss any questions you have with your health care provider.

## 2014-05-29 ENCOUNTER — Encounter (HOSPITAL_COMMUNITY): Payer: Self-pay

## 2014-05-29 ENCOUNTER — Encounter (HOSPITAL_COMMUNITY)
Admission: RE | Admit: 2014-05-29 | Discharge: 2014-05-29 | Disposition: A | Discharge status: Home / Self Care | Payer: Self-pay | Source: Ambulatory Visit | Attending: Obstetrics & Gynecology | Admitting: Obstetrics & Gynecology

## 2014-05-29 DIAGNOSIS — Z01812 Encounter for preprocedural laboratory examination: Secondary | ICD-10-CM | POA: Insufficient documentation

## 2014-05-29 LAB — CBC
HCT: 34.3 % — ABNORMAL LOW (ref 36.0–46.0)
Hemoglobin: 10.9 g/dL — ABNORMAL LOW (ref 12.0–15.0)
MCH: 27.6 pg (ref 26.0–34.0)
MCHC: 31.8 g/dL (ref 30.0–36.0)
MCV: 86.8 fL (ref 78.0–100.0)
PLATELETS: 272 10*3/uL (ref 150–400)
RBC: 3.95 MIL/uL (ref 3.87–5.11)
RDW: 13.4 % (ref 11.5–15.5)
WBC: 7.2 10*3/uL (ref 4.0–10.5)

## 2014-05-29 LAB — URINE MICROSCOPIC-ADD ON

## 2014-05-29 LAB — COMPREHENSIVE METABOLIC PANEL
ALT: 12 U/L (ref 0–35)
AST: 18 U/L (ref 0–37)
Albumin: 3.7 g/dL (ref 3.5–5.2)
Alkaline Phosphatase: 71 U/L (ref 39–117)
Anion gap: 5 (ref 5–15)
BILIRUBIN TOTAL: 0.3 mg/dL (ref 0.3–1.2)
BUN: 19 mg/dL (ref 6–23)
CO2: 27 mmol/L (ref 19–32)
CREATININE: 0.73 mg/dL (ref 0.50–1.10)
Calcium: 9 mg/dL (ref 8.4–10.5)
Chloride: 107 mEq/L (ref 96–112)
GFR calc Af Amer: >90 mL/min (ref >90)
GFR, EST NON AFRICAN AMERICAN: 90 mL/min — AB (ref >90)
GLUCOSE: 147 mg/dL — AB (ref 70–99)
Potassium: 4 mmol/L (ref 3.5–5.1)
SODIUM: 139 mmol/L (ref 135–145)
Total Protein: 6.8 g/dL (ref 6.0–8.3)

## 2014-05-29 LAB — URINALYSIS, ROUTINE W REFLEX MICROSCOPIC
Bilirubin Urine: NEGATIVE
GLUCOSE, UA: NEGATIVE mg/dL
Ketones, ur: NEGATIVE mg/dL
Nitrite: NEGATIVE
Protein, ur: 100 mg/dL — AB
Specific Gravity, Urine: >1.03 — ABNORMAL HIGH (ref 1.005–1.030)
UROBILINOGEN UA: 0.2 mg/dL (ref 0.0–1.0)
pH: 5.5 (ref 5.0–8.0)

## 2014-05-30 NOTE — Progress Notes (Signed)
Pt's pre-op labwork came back with Hgb of 10.9, Hmt of 34.3. Dr. Patsey Berthold notified and gave order to draw type and screen morning of surgery on 06/05/14. Order entered into Epic.

## 2014-06-05 ENCOUNTER — Encounter (HOSPITAL_COMMUNITY)
Admission: RE | Disposition: A | Discharge status: Home / Self Care | Payer: Self-pay | Source: Ambulatory Visit | Attending: Obstetrics & Gynecology

## 2014-06-05 ENCOUNTER — Encounter (HOSPITAL_COMMUNITY): Payer: Self-pay | Admitting: *Deleted

## 2014-06-05 ENCOUNTER — Ambulatory Visit (HOSPITAL_COMMUNITY): Payer: Self-pay | Admitting: Anesthesiology

## 2014-06-05 ENCOUNTER — Ambulatory Visit (HOSPITAL_COMMUNITY)
Admission: RE | Admit: 2014-06-05 | Discharge: 2014-06-05 | Disposition: A | Discharge status: Home / Self Care | Payer: Self-pay | Source: Ambulatory Visit | Attending: Obstetrics & Gynecology | Admitting: Obstetrics & Gynecology

## 2014-06-05 DIAGNOSIS — Z791 Long term (current) use of non-steroidal anti-inflammatories (NSAID): Secondary | ICD-10-CM | POA: Insufficient documentation

## 2014-06-05 DIAGNOSIS — N950 Postmenopausal bleeding: Secondary | ICD-10-CM

## 2014-06-05 DIAGNOSIS — N840 Polyp of corpus uteri: Secondary | ICD-10-CM | POA: Insufficient documentation

## 2014-06-05 DIAGNOSIS — R0789 Other chest pain: Secondary | ICD-10-CM | POA: Insufficient documentation

## 2014-06-05 DIAGNOSIS — E119 Type 2 diabetes mellitus without complications: Secondary | ICD-10-CM | POA: Insufficient documentation

## 2014-06-05 DIAGNOSIS — Z79899 Other long term (current) drug therapy: Secondary | ICD-10-CM | POA: Insufficient documentation

## 2014-06-05 DIAGNOSIS — Z833 Family history of diabetes mellitus: Secondary | ICD-10-CM | POA: Insufficient documentation

## 2014-06-05 DIAGNOSIS — I10 Essential (primary) hypertension: Secondary | ICD-10-CM | POA: Insufficient documentation

## 2014-06-05 DIAGNOSIS — N8500 Endometrial hyperplasia, unspecified: Secondary | ICD-10-CM

## 2014-06-05 DIAGNOSIS — R938 Abnormal findings on diagnostic imaging of other specified body structures: Secondary | ICD-10-CM

## 2014-06-05 DIAGNOSIS — N8501 Benign endometrial hyperplasia: Secondary | ICD-10-CM | POA: Insufficient documentation

## 2014-06-05 HISTORY — PX: POLYPECTOMY: SHX5525

## 2014-06-05 HISTORY — PX: HYSTEROSCOPY WITH D & C: SHX1775

## 2014-06-05 LAB — TYPE AND SCREEN
ABO/RH(D): O POS
Antibody Screen: NEGATIVE

## 2014-06-05 LAB — GLUCOSE, CAPILLARY
Glucose-Capillary: 96 mg/dL (ref 70–99)
Glucose-Capillary: 116 mg/dL — ABNORMAL HIGH (ref 70–99)

## 2014-06-05 SURGERY — POLYPECTOMY
Anesthesia: General

## 2014-06-05 MED ORDER — ONDANSETRON HCL 8 MG PO TABS: 8.0000 mg | ORAL_TABLET | Freq: Three times a day (TID) | ORAL | Status: DC | PRN

## 2014-06-05 MED ORDER — LIDOCAINE HCL (PF) 1 % IJ SOLN
INTRAMUSCULAR | Status: AC
  Filled 2014-06-05: qty 5

## 2014-06-05 MED ORDER — KETOROLAC TROMETHAMINE 10 MG PO TABS: 10.0000 mg | ORAL_TABLET | Freq: Three times a day (TID) | ORAL | Status: DC | PRN

## 2014-06-05 MED ORDER — CEFAZOLIN SODIUM-DEXTROSE 2-3 GM-% IV SOLR
INTRAVENOUS | Status: AC
  Filled 2014-06-05: qty 50

## 2014-06-05 MED ORDER — KETOROLAC TROMETHAMINE 30 MG/ML IJ SOLN
30.0000 mg | Freq: Once | INTRAMUSCULAR | Status: AC
  Administered 2014-06-05: 30 mg via INTRAVENOUS
  Filled 2014-06-05: qty 1

## 2014-06-05 MED ORDER — CEFAZOLIN SODIUM 10 G IJ SOLR
3.0000 g | INTRAMUSCULAR | Status: AC
  Administered 2014-06-05: 3 g via INTRAVENOUS
  Filled 2014-06-05: qty 3000

## 2014-06-05 MED ORDER — FENTANYL CITRATE 0.05 MG/ML IJ SOLN
25.0000 ug | INTRAMUSCULAR | Status: AC
Start: 2014-06-05 — End: 2014-06-05
  Administered 2014-06-05: 25 ug via INTRAVENOUS
  Filled 2014-06-05: qty 2

## 2014-06-05 MED ORDER — PROPOFOL 10 MG/ML IV BOLUS
INTRAVENOUS | Status: AC
Start: 2014-06-05 — End: 2014-06-05
  Filled 2014-06-05: qty 20

## 2014-06-05 MED ORDER — MIDAZOLAM HCL 2 MG/2ML IJ SOLN
INTRAMUSCULAR | Status: AC
  Filled 2014-06-05: qty 2

## 2014-06-05 MED ORDER — PROPOFOL 10 MG/ML IV BOLUS
INTRAVENOUS | Status: DC | PRN
  Administered 2014-06-05: 60 mg via INTRAVENOUS
  Administered 2014-06-05: 160 mg via INTRAVENOUS

## 2014-06-05 MED ORDER — ONDANSETRON HCL 4 MG/2ML IJ SOLN
4.0000 mg | Freq: Once | INTRAMUSCULAR | Status: AC
  Administered 2014-06-05: 4 mg via INTRAVENOUS
  Filled 2014-06-05: qty 2

## 2014-06-05 MED ORDER — LIDOCAINE HCL 1 % IJ SOLN
INTRAMUSCULAR | Status: DC | PRN
  Administered 2014-06-05: 35 mg via INTRADERMAL

## 2014-06-05 MED ORDER — FENTANYL CITRATE 0.05 MG/ML IJ SOLN: INTRAMUSCULAR | Status: DC | PRN

## 2014-06-05 MED ORDER — FENTANYL CITRATE 0.05 MG/ML IJ SOLN
INTRAMUSCULAR | Status: DC | PRN
  Administered 2014-06-05 (×2): 50 ug via INTRAVENOUS

## 2014-06-05 MED ORDER — LACTATED RINGERS IV SOLN
INTRAVENOUS | Status: DC
  Administered 2014-06-05: 07:00:00 via INTRAVENOUS

## 2014-06-05 MED ORDER — MIDAZOLAM HCL 2 MG/2ML IJ SOLN
1.0000 mg | INTRAMUSCULAR | Status: DC | PRN
  Administered 2014-06-05: 2 mg via INTRAVENOUS
  Filled 2014-06-05: qty 2

## 2014-06-05 MED ORDER — MIDAZOLAM HCL 5 MG/5ML IJ SOLN
INTRAMUSCULAR | Status: DC | PRN
  Administered 2014-06-05: 2 mg via INTRAVENOUS

## 2014-06-05 MED ORDER — FENTANYL CITRATE 0.05 MG/ML IJ SOLN: 25.0000 ug | INTRAMUSCULAR | Status: DC | PRN

## 2014-06-05 MED ORDER — CEFAZOLIN SODIUM 1-5 GM-% IV SOLN
INTRAVENOUS | Status: AC
  Filled 2014-06-05: qty 50

## 2014-06-05 MED ORDER — 0.9 % SODIUM CHLORIDE (POUR BTL) OPTIME
TOPICAL | Status: DC | PRN
  Administered 2014-06-05: 1000 mL

## 2014-06-05 MED ORDER — FENTANYL CITRATE 0.05 MG/ML IJ SOLN
INTRAMUSCULAR | Status: AC
  Filled 2014-06-05: qty 2

## 2014-06-05 MED ORDER — PROPOFOL 10 MG/ML IV BOLUS
INTRAVENOUS | Status: AC
  Filled 2014-06-05: qty 20

## 2014-06-05 MED ORDER — ONDANSETRON HCL 4 MG/2ML IJ SOLN: 4.0000 mg | Freq: Once | INTRAMUSCULAR | Status: DC | PRN

## 2014-06-05 MED ORDER — GLYCOPYRROLATE 0.2 MG/ML IJ SOLN
0.2000 mg | Freq: Once | INTRAMUSCULAR | Status: AC
  Administered 2014-06-05: 0.2 mg via INTRAVENOUS
  Filled 2014-06-05: qty 1

## 2014-06-05 SURGICAL SUPPLY — 24 items
BAG HAMPER (MISCELLANEOUS) ×3 IMPLANT
BLADE 15 SAFETY STRL DISP (BLADE) ×1 IMPLANT
CLOTH BEACON ORANGE TIMEOUT ST (SAFETY) ×3 IMPLANT
COVER LIGHT HANDLE STERIS (MISCELLANEOUS) ×6 IMPLANT
FORMALIN 10 PREFIL 480ML (MISCELLANEOUS) ×3 IMPLANT
GLOVE BIOGEL PI IND STRL 7.0 (GLOVE) IMPLANT
GLOVE BIOGEL PI IND STRL 8 (GLOVE) ×1 IMPLANT
GLOVE BIOGEL PI INDICATOR 7.0 (GLOVE) ×2
GLOVE BIOGEL PI INDICATOR 8 (GLOVE) ×2
GLOVE ECLIPSE 6.5 STRL STRAW (GLOVE) ×2 IMPLANT
GLOVE ECLIPSE 8.0 STRL XLNG CF (GLOVE) ×3 IMPLANT
GLOVE EXAM NITRILE MD LF STRL (GLOVE) ×2 IMPLANT
GOWN STRL REUS W/TWL LRG LVL3 (GOWN DISPOSABLE) ×4 IMPLANT
GOWN STRL REUS W/TWL XL LVL3 (GOWN DISPOSABLE) ×3 IMPLANT
KIT ROOM TURNOVER AP CYSTO (KITS) ×3 IMPLANT
MANIFOLD NEPTUNE II (INSTRUMENTS) ×3 IMPLANT
NS IRRIG 1000ML POUR BTL (IV SOLUTION) ×3 IMPLANT
PACK PERI GYN (CUSTOM PROCEDURE TRAY) ×3 IMPLANT
PAD ARMBOARD 7.5X6 YLW CONV (MISCELLANEOUS) ×3 IMPLANT
PAD TELFA 3X4 1S STER (GAUZE/BANDAGES/DRESSINGS) ×4 IMPLANT
SET BASIN LINEN APH (SET/KITS/TRAYS/PACK) ×3 IMPLANT
SET IRRIG Y TYPE TUR BLADDER L (SET/KITS/TRAYS/PACK) ×2 IMPLANT
SUT MNCRL 0 VIOLET CTX 36 (SUTURE) ×1 IMPLANT
SUT MONOCRYL 0 CTX 36 (SUTURE)

## 2014-06-05 NOTE — H&P (Signed)
Preoperative History and Physical  Crystal Vega is a 64 y.o. No obstetric history on file. with No LMP recorded. Patient is postmenopausal. admitted for a hysteroscopy uterine curettage and removal of endometrial polyp.  She had 6 weeks of post menopausal bleeding prior to being seen in office. CT revealed a thickened endometrium with mass.  Endometrial biopsy revealed cystic hyperplast6ic changes of the endometrium and a polyp. She is here for polyp removal and complete evaluation of the endometrium  PMH:    Past Medical History  Diagnosis Date  . Type 2 diabetes mellitus   . Essential hypertension, benign     PSH:     Past Surgical History  Procedure Laterality Date  . Tubal ligation      POb/GynH:      OB History    No data available      SH:   History  Substance Use Topics  . Smoking status: Never Smoker   . Smokeless tobacco: Not on file  . Alcohol Use: No    FH:    Family History  Problem Relation Age of Onset  . Diabetes Mellitus II Father   . Colon cancer Mother      Allergies: No Known Allergies  Medications:       No current facility-administered medications on file prior to encounter.   Current Outpatient Prescriptions on File Prior to Encounter  Medication Sig Dispense Refill  . hydrochlorothiazide (HYDRODIURIL) 25 MG tablet Take 25 mg by mouth daily.    Marland Kitchen ibuprofen (ADVIL,MOTRIN) 800 MG tablet Take 800 mg by mouth every 8 (eight) hours as needed (for leg pain).    Marland Kitchen losartan (COZAAR) 50 MG tablet Take 50 mg by mouth daily.    . megestrol (MEGACE) 40 MG tablet Take 1 tablet (40 mg total) by mouth daily. 30 tablet 3  . metFORMIN (GLUCOPHAGE) 500 MG tablet Take 500 mg by mouth 2 (two) times daily.     . ondansetron (ZOFRAN) 4 MG tablet Take 1 tablet (4 mg total) by mouth every 6 (six) hours. (Patient taking differently: Take 4 mg by mouth every 6 (six) hours as needed for nausea. ) 12 tablet 0  . megestrol (MEGACE) 20 MG tablet Take two tablets  (40 mg) three times per day time three days,  then take two tablets (40 mg) two times per day time three days,  then take two tablets (40 mg)once per day (Patient not taking: Reported on 05/27/2014) 50 tablet 0  . oxyCODONE-acetaminophen (PERCOCET/ROXICET) 5-325 MG per tablet Take 2 tablets by mouth every 4 (four) hours as needed for severe pain. (Patient not taking: Reported on 05/27/2014) 15 tablet 0   Review of Systems:   Review of Systems  Constitutional: Negative for fever, chills, weight loss, malaise/fatigue and diaphoresis.  HENT: Negative for hearing loss, ear pain, nosebleeds, congestion, sore throat, neck pain, tinnitus and ear discharge.   Eyes: Negative for blurred vision, double vision, photophobia, pain, discharge and redness.  Respiratory: Negative for cough, hemoptysis, sputum production, shortness of breath, wheezing and stridor.   Cardiovascular: Negative for chest pain, palpitations, orthopnea, claudication, leg swelling and PND.  Gastrointestinal: Positive for abdominal pain. Negative for heartburn, nausea, vomiting, diarrhea, constipation, blood in stool and melena.  Genitourinary: Negative for dysuria, urgency, frequency, hematuria and flank pain.  Musculoskeletal: Negative for myalgias, back pain, joint pain and falls.  Skin: Negative for itching and rash.  Neurological: Negative for dizziness, tingling, tremors, sensory change, speech change, focal weakness, seizures, loss  of consciousness, weakness and headaches.  Endo/Heme/Allergies: Negative for environmental allergies and polydipsia. Does not bruise/bleed easily.  Psychiatric/Behavioral: Negative for depression, suicidal ideas, hallucinations, memory loss and substance abuse. The patient is not nervous/anxious and does not have insomnia.      PHYSICAL EXAM:  Temperature 99 F (37.2 C), temperature source Oral.    Vitals reviewed. Constitutional: She is oriented to person, place, and time. She appears  well-developed and well-nourished.  HENT:  Head: Normocephalic and atraumatic.  Right Ear: External ear normal.  Left Ear: External ear normal.  Nose: Nose normal.  Mouth/Throat: Oropharynx is clear and moist.  Eyes: Conjunctivae and EOM are normal. Pupils are equal, round, and reactive to light. Right eye exhibits no discharge. Left eye exhibits no discharge. No scleral icterus.  Neck: Normal range of motion. Neck supple. No tracheal deviation present. No thyromegaly present.  Cardiovascular: Normal rate, regular rhythm, normal heart sounds and intact distal pulses.  Exam reveals no gallop and no friction rub.   No murmur heard. Respiratory: Effort normal and breath sounds normal. No respiratory distress. She has no wheezes. She has no rales. She exhibits no tenderness.  GI: Soft. Bowel sounds are normal. She exhibits no distension and no mass. There is tenderness. There is no rebound and no guarding.  Genitourinary:       Vulva is normal without lesions Vagina is pink moist without discharge Cervix normal in appearance and pap is normal Uterus is normal Adnexa is negative with normal sized ovaries by sonogram  Musculoskeletal: Normal range of motion. She exhibits no edema and no tenderness.  Neurological: She is alert and oriented to person, place, and time. She has normal reflexes. She displays normal reflexes. No cranial nerve deficit. She exhibits normal muscle tone. Coordination normal.  Skin: Skin is warm and dry. No rash noted. No erythema. No pallor.  Psychiatric: She has a normal mood and affect. Her behavior is normal. Judgment and thought content normal.    Labs: Results for orders placed or performed during the hospital encounter of 06/05/14 (from the past 336 hour(s))  Type and screen   Collection Time: 06/05/14  6:25 AM  Result Value Ref Range   ABO/RH(D) O POS    Antibody Screen PENDING    Sample Expiration 06/08/2014   Results for orders placed or performed during  the hospital encounter of 05/29/14 (from the past 336 hour(s))  CBC   Collection Time: 05/29/14 10:15 AM  Result Value Ref Range   WBC 7.2 4.0 - 10.5 K/uL   RBC 3.95 3.87 - 5.11 MIL/uL   Hemoglobin 10.9 (L) 12.0 - 15.0 g/dL   HCT 34.3 (L) 36.0 - 46.0 %   MCV 86.8 78.0 - 100.0 fL   MCH 27.6 26.0 - 34.0 pg   MCHC 31.8 30.0 - 36.0 g/dL   RDW 13.4 11.5 - 15.5 %   Platelets 272 150 - 400 K/uL  Comprehensive metabolic panel   Collection Time: 05/29/14 10:15 AM  Result Value Ref Range   Sodium 139 135 - 145 mmol/L   Potassium 4.0 3.5 - 5.1 mmol/L   Chloride 107 96 - 112 mEq/L   CO2 27 19 - 32 mmol/L   Glucose, Bld 147 (H) 70 - 99 mg/dL   BUN 19 6 - 23 mg/dL   Creatinine, Ser 0.73 0.50 - 1.10 mg/dL   Calcium 9.0 8.4 - 10.5 mg/dL   Total Protein 6.8 6.0 - 8.3 g/dL   Albumin 3.7 3.5 - 5.2 g/dL  AST 18 0 - 37 U/L   ALT 12 0 - 35 U/L   Alkaline Phosphatase 71 39 - 117 U/L   Total Bilirubin 0.3 0.3 - 1.2 mg/dL   GFR calc non Af Amer 90 (L) >90 mL/min   GFR calc Af Amer >90 >90 mL/min   Anion gap 5 5 - 15  Urinalysis, Routine w reflex microscopic   Collection Time: 05/29/14 10:22 AM  Result Value Ref Range   Color, Urine YELLOW YELLOW   APPearance CLEAR CLEAR   Specific Gravity, Urine >1.030 (H) 1.005 - 1.030   pH 5.5 5.0 - 8.0   Glucose, UA NEGATIVE NEGATIVE mg/dL   Hgb urine dipstick LARGE (A) NEGATIVE   Bilirubin Urine NEGATIVE NEGATIVE   Ketones, ur NEGATIVE NEGATIVE mg/dL   Protein, ur 100 (A) NEGATIVE mg/dL   Urobilinogen, UA 0.2 0.0 - 1.0 mg/dL   Nitrite NEGATIVE NEGATIVE   Leukocytes, UA SMALL (A) NEGATIVE  Urine microscopic-add on   Collection Time: 05/29/14 10:22 AM  Result Value Ref Range   Squamous Epithelial / LPF MANY (A) RARE   WBC, UA TOO NUMEROUS TO COUNT <3 WBC/hpf   RBC / HPF TOO NUMEROUS TO COUNT <3 RBC/hpf   Bacteria, UA MANY (A) RARE    EKG: Orders placed or performed in visit on 10/10/13  . EKG 12-Lead    Imaging Studies: No results  found.    Assessment: Post menopausal bleeding Patient Active Problem List   Diagnosis Date Noted  . Simple endometrial 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 04/16/2014  . Atypical chest pain 10/10/2013  . Essential hypertension, benign 10/10/2013  . Morbid obesity 10/10/2013  . Type 2 diabetes mellitus 10/10/2013    Plan: Hysteroscopy uterine curettage with polyp removal  EURE,LUTHER H 06/05/2014 7:17 AM

## 2014-06-05 NOTE — Op Note (Signed)
  Preoperative diagnosis:  1.  Post menopausal bleeding                                          2.  Endometrial polyp                                          3.  Cystic hyperplasia of the endometrium without atypia                                           Postoperative diagnoses: Same as above   Procedure: Hysteroscopy, uterine curettage, removal of endometrial polyps  Surgeon: Elonda Husky MD  Anesthesia: Laryngeal mask airway  Findings: The endometrium was significant for 3 endometrial polyps  Description of operation: The patient was taken to the operating room and placed in the supine position. She underwent general anesthesia using the laryngeal mask airway. She was placed in the dorsal lithotomy position and prepped and draped in the usual sterile fashion. A Graves speculum was placed and the anterior cervical lip was grasped with a single-tooth tenaculum. The cervix was dilated serially to allow passage of the hysteroscope. Diagnostic hysteroscopy was performed and 3 endometrial polyps were found.These were removed.  A vigorous uterine curettage was then performed and all tissue sent to pathology for evaluation.. The patient was awakened from anesthesia and taken to the recovery room in good stable condition all counts were correct. She received 3g of Ancef and 30 mg of Toradol preoperatively. She will be discharged from the recovery room and followed up in the office in 1- 2 weeks.  [place]oy Som[place] H 06/05/2014 8:25 AM

## 2014-06-05 NOTE — Anesthesia Postprocedure Evaluation (Signed)
  Anesthesia Post-op Note  Patient: Crystal Vega  Procedure(s) Performed: Procedure(s): ENDOMETRIAL POLYPECTOMY (N/A) DILATATION AND CURETTAGE /HYSTEROSCOPY (N/A)  Patient Location: PACU  Anesthesia Type:General  Level of Consciousness: awake, alert , oriented and patient cooperative  Airway and Oxygen Therapy: Patient Spontanous Breathing  Post-op Pain: none  Post-op Assessment: Post-op Vital signs reviewed, Patient's Cardiovascular Status Stable, Respiratory Function Stable, Patent Airway, No signs of Nausea or vomiting and Pain level controlled  Post-op Vital Signs: Reviewed and stable  Last Vitals:  Filed Vitals:   06/05/14 0730  BP: 91/70  Temp:   Resp: 40    Complications: No apparent anesthesia complications

## 2014-06-05 NOTE — Addendum Note (Signed)
Addendum  created 06/05/14 [id number] by Charmaine Downs, CRNA   Modules edited: Anesthesia Flowsheet

## 2014-06-05 NOTE — Anesthesia Preprocedure Evaluation (Signed)
Anesthesia Evaluation  Patient identified by MRN, date of birth, ID band Patient awake    Reviewed: Allergy & Precautions, NPO status , Patient's Chart, lab work & pertinent test results  Airway Mallampati: II  TM Distance: >3 FB     Dental  (+) Partial Lower, Dental Advisory Given   Pulmonary neg pulmonary ROS,  breath sounds clear to auscultation        Cardiovascular hypertension, Pt. on medications Rhythm:Regular Rate:Normal     Neuro/Psych    GI/Hepatic negative GI ROS,   Endo/Other  diabetes, Type 2Morbid obesity  Renal/GU      Musculoskeletal   Abdominal   Peds  Hematology   Anesthesia Other Findings   Reproductive/Obstetrics                             Anesthesia Physical Anesthesia Plan  ASA: II  Anesthesia Plan: General   Post-op Pain Management:    Induction: Intravenous  Airway Management Planned: LMA  Additional Equipment:   Intra-op Plan:   Post-operative Plan: Extubation in OR  Informed Consent: I have reviewed the patients History and Physical, chart, labs and discussed the procedure including the risks, benefits and alternatives for the proposed anesthesia with the patient or authorized representative who has indicated his/her understanding and acceptance.     Plan Discussed with:   Anesthesia Plan Comments:         Anesthesia Quick Evaluation

## 2014-06-05 NOTE — Transfer of Care (Signed)
Immediate Anesthesia Transfer of Care Note  Patient: Crystal Vega  Procedure(s) Performed: Procedure(s): ENDOMETRIAL POLYPECTOMY (N/A) DILATATION AND CURETTAGE /HYSTEROSCOPY (N/A)  Patient Location: PACU  Anesthesia Type:General  Level of Consciousness: awake, alert  and patient cooperative  Airway & Oxygen Therapy: Patient Spontanous Breathing and Patient connected to face mask oxygen  Post-op Assessment: Report given to PACU RN, Post -op Vital signs reviewed and stable and Patient moving all extremities  Post vital signs: Reviewed and stable  Complications: No apparent anesthesia complications

## 2014-06-05 NOTE — Discharge Instructions (Signed)
Dilation and Curettage or Vacuum Curettage Dilation and curettage (D&C) and vacuum curettage are minor procedures. A D&C involves stretching (dilation) the cervix and scraping (curettage) the inside lining of the womb (uterus). During a D&C, tissue is gently scraped from the inside lining of the uterus. During a vacuum curettage, the lining and tissue in the uterus are removed with the use of gentle suction.  Curettage may be performed to either diagnose or treat a problem. As a diagnostic procedure, curettage is performed to examine tissues from the uterus. A diagnostic curettage may be performed for the following symptoms:   Irregular bleeding in the uterus.   Bleeding with the development of clots.   Spotting between menstrual periods.   Prolonged menstrual periods.   Bleeding after menopause.   No menstrual period (amenorrhea).   A change in size and shape of the uterus.  As a treatment procedure, curettage may be performed for the following reasons:   Removal of an IUD (intrauterine device).   Removal of retained placenta after giving birth. Retained placenta can cause an infection or bleeding severe enough to require transfusions.   Abortion.   Miscarriage.   Removal of polyps inside the uterus.   Removal of uncommon types of noncancerous lumps (fibroids).  LET [facility] CARE PROVIDER KNOW ABOUT:   Any allergies you have.   All medicines you are taking, including vitamins, herbs, eye drops, creams, and over-the-counter medicines.   Previous problems you or members of your family have had with the use of anesthetics.   Any blood disorders you have.   Previous surgeries you have had.   Medical conditions you have. RISKS AND COMPLICATIONS  Generally, this is a safe procedure. However, as with any procedure, complications can occur. Possible complications include:  Excessive bleeding.   Infection of the uterus.   Damage to the cervix.    Development of scar tissue (adhesions) inside the uterus, later causing abnormal amounts of menstrual bleeding.   Complications from the general anesthetic, if a general anesthetic is used.   Putting a hole (perforation) in the uterus. This is rare.  BEFORE THE PROCEDURE   Eat and drink before the procedure only as directed by your health care provider.   Arrange for someone to take you home.  PROCEDURE  This procedure usually takes about 15-30 minutes.  You will be given one of the following:  A medicine that numbs the area in and around the cervix (local anesthetic).   A medicine to make you sleep through the procedure (general anesthetic).  You will lie on your back with your legs in stirrups.   A warm metal or plastic instrument (speculum) will be placed in your vagina to keep it open and to allow the health care provider to see the cervix.  There are two ways in which your cervix can be softened and dilated. These include:   Taking a medicine.   Having thin rods (laminaria) inserted into your cervix.   A curved tool (curette) will be used to scrape cells from the inside lining of the uterus. In some cases, gentle suction is applied with the curette. The curette will then be removed.  AFTER THE PROCEDURE   You will rest in the recovery area until you are stable and are ready to go home.   You may feel sick to your stomach (nauseous) or throw up (vomit) if you were given a general anesthetic.   You may have a sore throat if a tube  was placed in your throat during general anesthesia.   You may have light cramping and bleeding. This may last for 2 days to 2 weeks after the procedure.   Your uterus needs to make a new lining after the procedure. This may make your next period late. Document Released: 05/17/2005 Document Revised: 01/17/2013 Document Reviewed: 12/14/2012 [facility] Patient Information 2015 [place], [place]. This information is not intended to  replace advice given to you by your health care provider. Make sure you discuss any questions you have with your health care provider.

## 2014-06-05 NOTE — Anesthesia Procedure Notes (Signed)
Procedure Name: LMA Insertion Date/Time: 06/05/2014 7:45 AM Performed by: Charmaine Downs Pre-anesthesia Checklist: Patient identified, Emergency Drugs available, Suction available and Patient being monitored Patient Re-evaluated:Patient Re-evaluated prior to inductionOxygen Delivery Method: Circle system utilized Preoxygenation: Pre-oxygenation with 100% oxygen Intubation Type: IV induction Ventilation: Mask ventilation without difficulty LMA: LMA inserted LMA Size: 4.0 Tube type: Oral Number of attempts: 1 Placement Confirmation: positive ETCO2 and breath sounds checked- equal and bilateral Tube secured with: Tape Dental Injury: Teeth and Oropharynx as per pre-operative assessment

## 2014-06-06 ENCOUNTER — Encounter (HOSPITAL_COMMUNITY): Payer: Self-pay | Admitting: Obstetrics & Gynecology

## 2014-06-12 ENCOUNTER — Ambulatory Visit (INDEPENDENT_AMBULATORY_CARE_PROVIDER_SITE_OTHER): Payer: Self-pay | Admitting: Obstetrics & Gynecology

## 2014-06-12 ENCOUNTER — Encounter: Payer: Self-pay | Admitting: Obstetrics & Gynecology

## 2014-06-12 VITALS — BP 160/90 | Wt 299.0 lb

## 2014-06-12 DIAGNOSIS — Z9889 Other specified postprocedural states: Secondary | ICD-10-CM

## 2014-06-12 DIAGNOSIS — N840 Polyp of corpus uteri: Secondary | ICD-10-CM

## 2014-06-12 DIAGNOSIS — N8501 Benign endometrial hyperplasia: Secondary | ICD-10-CM

## 2014-06-12 NOTE — Progress Notes (Signed)
Patient ID: ELTHA TINGLEY, female   DOB: March 23, 1951, 64 y.o.   MRN: [id number]  HPI: Patient returns for routine postoperative follow-up having undergone hysteroscopy D&C with removal of polyps on 06/06/2103. The patient's early postoperative recovery while in the hospital was notable for outptient. Since hospital discharge the patient reports some bleeding.   Current Outpatient Prescriptions  Medication Sig Dispense Refill  . hydrochlorothiazide (HYDRODIURIL) 25 MG tablet Take 25 mg by mouth daily.    Marland Kitchen losartan (COZAAR) 50 MG tablet Take 50 mg by mouth daily.    . megestrol (MEGACE) 40 MG tablet Take 1 tablet (40 mg total) by mouth daily. 30 tablet 3  . metFORMIN (GLUCOPHAGE) 500 MG tablet Take 500 mg by mouth 2 (two) times daily.     Marland Kitchen ibuprofen (ADVIL,MOTRIN) 800 MG tablet Take 800 mg by mouth every 8 (eight) hours as needed (for leg pain).    Marland Kitchen ketorolac (TORADOL) 10 MG tablet Take 1 tablet (10 mg total) by mouth every 8 (eight) hours as needed. (Patient not taking: Reported on 06/12/2014) 15 tablet 0  . megestrol (MEGACE) 20 MG tablet Take two tablets (40 mg) three times per day time three days,  then take two tablets (40 mg) two times per day time three days,  then take two tablets (40 mg)once per day (Patient not taking: Reported on 06/12/2014) 50 tablet 0  . ondansetron (ZOFRAN) 4 MG tablet Take 1 tablet (4 mg total) by mouth every 6 (six) hours. (Patient not taking: Reported on 06/12/2014) 12 tablet 0  . ondansetron (ZOFRAN) 8 MG tablet Take 1 tablet (8 mg total) by mouth every 8 (eight) hours as needed for nausea. (Patient not taking: Reported on 06/12/2014) 12 tablet 0  . oxyCODONE-acetaminophen (PERCOCET/ROXICET) 5-325 MG per tablet Take 2 tablets by mouth every 4 (four) hours as needed for severe pain. (Patient not taking: Reported on 05/27/2014) 15 tablet 0   No current facility-administered medications for this visit.    Physical Exam: Exam Minimal bloody discharge  Diagnostic  Tests: non  Impression: Normal post op course  Plan: Follow up 3 months

## 2014-06-14 NOTE — Progress Notes (Signed)
Patient ID: Crystal Vega, female   DOB: 1950/08/11, 64 y.o.   MRN: [id number] Endometrial Biopsy Procedure Note  Pre-operative Diagnosis: enlarged endometrium in a post menopausal woman  Post-operative Diagnosis: same  Indications: enlarged endometrium in a post menopausal woman  Procedure Details   Urine pregnancy test was not done.  The risks (including infection, bleeding, pain, and uterine perforation) and benefits of the procedure were explained to the patient and Written informed consent was obtained.  Antibiotic prophylaxis against endocarditis was not indicated.   The patient was placed in the dorsal lithotomy position.  Bimanual exam showed the uterus to be in the neutral position.  A Graves' speculum inserted in the vagina, and the cervix prepped with povidone iodine.  Endocervical curettage with a Kevorkian curette was not performed.   A sharp tenaculum was applied to the anterior lip of the cervix for stabilization.  A sterile uterine sound was used to sound the uterus to a depth of 6cm.  A Pipelle endometrial aspirator was used to sample the endometrium.  Sample was sent for pathologic examination.  Condition: Stable  Complications: None  Plan:  The patient was advised to call for any fever or for prolonged or severe pain or bleeding. She was advised to use OTC ibuprofen as needed for mild to moderate pain. She was advised to avoid vaginal intercourse for 48 hours or until the bleeding has completely stopped.  Attending Physician Documentation: I was present for or participated in the entire procedure, including opening and closing.

## 2014-07-30 ENCOUNTER — Ambulatory Visit (INDEPENDENT_AMBULATORY_CARE_PROVIDER_SITE_OTHER): Payer: Self-pay | Admitting: Obstetrics & Gynecology

## 2014-07-30 ENCOUNTER — Encounter: Payer: Self-pay | Admitting: Obstetrics & Gynecology

## 2014-07-30 VITALS — BP 150/90 | HR 76 | Wt 295.0 lb

## 2014-07-30 DIAGNOSIS — N8501 Benign endometrial hyperplasia: Secondary | ICD-10-CM

## 2014-07-30 DIAGNOSIS — N950 Postmenopausal bleeding: Secondary | ICD-10-CM

## 2014-07-30 MED ORDER — MEGESTROL ACETATE 40 MG PO TABS: ORAL_TABLET | ORAL | Status: DC

## 2014-07-30 NOTE — Progress Notes (Signed)
Patient ID: Crystal Vega, female   DOB: 04-20-1951, 64 y.o.   MRN: [id number] Chief Complaint  Patient presents with  . gyn visit    c/c vaginal bleeding the whole month February. 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   Abdomen soft tender in both lower quadrants no guarding no rebound  A Simple hyperplasia with continued bleeding  Plan Increase megestrol to 80 mg daily  If still does not respond may need to do an ablation follow up 1 month

## 2014-09-03 ENCOUNTER — Ambulatory Visit: Payer: Self-pay | Admitting: Obstetrics & Gynecology

## 2014-09-11 ENCOUNTER — Ambulatory Visit (INDEPENDENT_AMBULATORY_CARE_PROVIDER_SITE_OTHER): Payer: Self-pay | Admitting: Obstetrics & Gynecology

## 2014-09-11 ENCOUNTER — Encounter: Payer: Self-pay | Admitting: Obstetrics & Gynecology

## 2014-09-11 VITALS — BP 118/79 | HR 100 | Wt 297.0 lb

## 2014-09-11 DIAGNOSIS — N950 Postmenopausal bleeding: Secondary | ICD-10-CM

## 2014-09-11 DIAGNOSIS — N8501 Benign endometrial hyperplasia: Secondary | ICD-10-CM

## 2014-09-11 MED ORDER — MEGESTROL ACETATE 40 MG PO TABS: ORAL_TABLET | ORAL | Status: DC

## 2014-09-11 NOTE — Progress Notes (Signed)
Patient ID: Crystal Vega, female   DOB: 1951/05/16, 64 y.o.   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  The summary and outline of the counseling and care coordination is summarized in the note above.   All questions were answered.

## 2015-10-09 ENCOUNTER — Other Ambulatory Visit: Payer: Self-pay | Admitting: Obstetrics & Gynecology

## 2016-10-22 ENCOUNTER — Other Ambulatory Visit: Payer: Self-pay | Admitting: Obstetrics & Gynecology

## 2017-05-26 ENCOUNTER — Other Ambulatory Visit: Payer: Self-pay | Admitting: Obstetrics & Gynecology

## 2017-08-01 ENCOUNTER — Encounter: Payer: Self-pay | Admitting: Gastroenterology

## 2017-09-22 ENCOUNTER — Ambulatory Visit: Payer: Self-pay | Admitting: Gastroenterology

## 2017-11-29 ENCOUNTER — Ambulatory Visit: Payer: Self-pay | Admitting: Gastroenterology

## 2018-02-27 ENCOUNTER — Ambulatory Visit: Payer: Medicare HMO | Admitting: Gastroenterology

## 2018-02-27 ENCOUNTER — Encounter: Payer: Self-pay | Admitting: Gastroenterology

## 2018-02-27 DIAGNOSIS — Z800 Family history of malignant neoplasm of digestive organs: Secondary | ICD-10-CM

## 2018-02-27 DIAGNOSIS — K5900 Constipation, unspecified: Secondary | ICD-10-CM | POA: Insufficient documentation

## 2018-02-27 DIAGNOSIS — R195 Other fecal abnormalities: Secondary | ICD-10-CM

## 2018-02-27 NOTE — Progress Notes (Signed)
Primary Care Physician:  Lemmie Evens, MD  Primary Gastroenterologist:  Garfield Cornea, MD   Chief Complaint  Patient presents with  . +ifobt    never had tcs    HPI:  Crystal Vega is a 67 y.o. female here at the request of Dr. Karie Kirks for colonoscopy.  We received initial referral for colonoscopy in November 2018 but unfortunately patient could not be reached.  In February he had a positive I FOBT, we received an additional referral.  Patient was made an appointment in April but she canceled.  She presents today for the first time.  She states during at the time she collectively I FOBT she was having a lot of vaginal bleeding which has since stopped.  Previously followed by Dr. Elonda Husky, endometrial hyperplasia and polyps.   From a GI standpoint she does well overall.  She has a bowel movement about every other day in the setting of chronic pain medication.  Occasional hard stool.  No rectal bleeding or melena.  No abdominal pain.  No significant upper GI symptoms.  She has never had a colonoscopy.  Mother died of cancer when patient was 16 years old.  Mother was 43.  Believes it was colon cancer, she had a colostomy.  Patient takes daily hydrocodone for knee pain.  Current Outpatient Medications  Medication Sig Dispense Refill  . atorvastatin (LIPITOR) 20 MG tablet Take 1 tablet by mouth daily.  3  . hydrochlorothiazide (HYDRODIURIL) 25 MG tablet Take 25 mg by mouth daily.    Marland Kitchen HYDROcodone-acetaminophen (NORCO/VICODIN) 5-325 MG tablet Take 1 tablet by mouth as needed.  0  . ibuprofen (ADVIL,MOTRIN) 800 MG tablet Take 800 mg by mouth 3 (three) times daily.     Marland Kitchen losartan (COZAAR) 50 MG tablet Take 50 mg by mouth daily.    . metFORMIN (GLUCOPHAGE) 500 MG tablet Take 500 mg by mouth 2 (two) times daily.     . potassium chloride (MICRO-K) 10 MEQ CR capsule Take 1 capsule by mouth daily.     No current facility-administered medications for this visit.     Allergies as of 02/27/2018  .  (No Known Allergies)    Past Medical History:  Diagnosis Date  . Essential hypertension, benign   . Type 2 diabetes mellitus ([place])     Past Surgical History:  Procedure Laterality Date  . HYSTEROSCOPY W/D&C N/A 06/05/2014   Procedure: DILATATION AND CURETTAGE /HYSTEROSCOPY;  Surgeon: Florian Buff, MD;  Location: [facility];  Service: Gynecology;  Laterality: N/A;  . POLYPECTOMY N/A 06/05/2014   Procedure: ENDOMETRIAL POLYPECTOMY;  Surgeon: Florian Buff, MD;  Location: [facility];  Service: Gynecology;  Laterality: N/A;  . TUBAL LIGATION      Family History  Problem Relation Age of Onset  . Diabetes Mellitus II Father   . Colon cancer Mother        Deceased age 87    Social History   Socioeconomic History  . Marital status: Married    Spouse name: Not on file  . Number of children: Not on file  . Years of education: Not on file  . Highest education level: Not on file  Occupational History  . Not on file  Social Needs  . Financial resource strain: Not on file  . Food insecurity:    Worry: Not on file    Inability: Not on file  . Transportation needs:    Medical: Not on file    Non-medical: Not on file  Tobacco Use  . Smoking status: Never Smoker  . Smokeless tobacco: Never Used  Substance and Sexual Activity  . Alcohol use: No  . Drug use: No  . Sexual activity: Not on file  Lifestyle  . Physical activity:    Days per week: Not on file    Minutes per session: Not on file  . Stress: Not on file  Relationships  . Social connections:    Talks on phone: Not on file    Gets together: Not on file    Attends religious service: Not on file    Active member of club or organization: Not on file    Attends meetings of clubs or organizations: Not on file    Relationship status: Not on file  . Intimate partner violence:    Fear of current or ex partner: Not on file    Emotionally abused: Not on file    Physically abused: Not on file    Forced sexual activity: Not on file   Other Topics Concern  . Not on file  Social History Narrative  . Not on file      ROS:  General: Negative for anorexia, weight loss, fever, chills, fatigue, weakness. Eyes: Negative for vision changes.  ENT: Negative for hoarseness, difficulty swallowing , nasal congestion. CV: Negative for chest pain, angina, palpitations, dyspnea on exertion, peripheral edema.  Respiratory: Negative for dyspnea at rest, dyspnea on exertion, cough, sputum, wheezing.  GI: See history of present illness. GU:  Negative for dysuria, hematuria, urinary incontinence, urinary frequency, nocturnal urination.  MS: Chronic knee pain Derm: Negative for rash or itching.  Neuro: Negative for weakness, abnormal sensation, seizure, frequent headaches, memory loss, confusion.  Psych: Negative for anxiety, depression, suicidal ideation, hallucinations.  Endo: Negative for unusual weight change.  Heme: Negative for bruising or bleeding. Allergy: Negative for rash or hives.    Physical Examination:  BP (!) 154/80   Pulse (!) 104   Temp 98.6 F (37 C) (Oral)   Ht 5\' 2"  (1.575 m)   Wt 291 lb 6.4 oz (132.2 kg)   BMI 53.30 kg/m    General: Well-nourished, well-developed in no acute distress.  Head: Normocephalic, atraumatic.   Eyes: Conjunctiva pink, no icterus. Mouth: Oropharyngeal mucosa moist and pink , no lesions erythema or exudate. Neck: Supple without thyromegaly, masses, or lymphadenopathy.  Lungs: Clear to auscultation bilaterally.  Heart: Regular rate and rhythm, no murmurs rubs or gallops.  Abdomen: Bowel sounds are normal, nontender, nondistended, no hepatosplenomegaly or masses, no abdominal bruits or    hernia , no rebound or guarding.   Rectal: Not performed Extremities: No lower extremity edema. No clubbing or deformities.  Neuro: Alert and oriented x 4 , grossly normal neurologically.  Skin: Warm and dry, no rash or jaundice.   Psych: Alert and cooperative, normal mood and affect.

## 2018-02-27 NOTE — Patient Instructions (Signed)
1. Please go home and think about colonoscopy. Let us know what you decide.  2. For constipation, consider taking one capful of Miralax on days you don't have a good bowel movement. This will keep your stools soft and help avoid constipation.

## 2018-02-27 NOTE — Assessment & Plan Note (Addendum)
Very pleasant 67 year old female with morbid obesity, diabetes, chronic pain medication, family history of colon cancer who presents with Hemoccult positive stool.  No prior colonoscopy.  Lengthy discussion with patient, cannot exclude cross-contamination due to vaginal bleeding resulting in positive iFOBT.  She requested repeating I FOBT however given her family history, the fact she never had a colonoscopy, recommendations would not change.  She should consider colonoscopy in the near future. Patient is undecided at this time.   Given her obesity pain medication use, would plan for deep sedation.  Patient to call back and let us know what she decides regarding procedure.

## 2018-02-27 NOTE — Progress Notes (Signed)
CC'D TO PCP °

## 2018-02-27 NOTE — Assessment & Plan Note (Signed)
Intermittent constipation.  Utilize MiraLAX 1 capful on day she does not have an adequate bowel movement.  We discussed prescription options for opioid induced constipation but at this point she does not feel she needs it.

## 2019-11-06 ENCOUNTER — Other Ambulatory Visit: Payer: Self-pay | Admitting: Nurse Practitioner

## 2019-11-06 ENCOUNTER — Other Ambulatory Visit (HOSPITAL_COMMUNITY): Payer: Self-pay | Admitting: Nurse Practitioner

## 2019-11-06 ENCOUNTER — Other Ambulatory Visit: Payer: Self-pay | Admitting: Family Medicine

## 2019-11-06 DIAGNOSIS — N644 Mastodynia: Secondary | ICD-10-CM

## 2019-11-06 DIAGNOSIS — R200 Anesthesia of skin: Secondary | ICD-10-CM

## 2019-11-08 ENCOUNTER — Other Ambulatory Visit: Payer: Self-pay | Admitting: Nurse Practitioner

## 2019-11-08 ENCOUNTER — Other Ambulatory Visit: Payer: Self-pay

## 2019-11-08 ENCOUNTER — Ambulatory Visit
Admission: RE | Admit: 2019-11-08 | Discharge: 2019-11-08 | Disposition: A | Discharge status: Home / Self Care | Payer: Medicare HMO | Source: Ambulatory Visit | Attending: Nurse Practitioner | Admitting: Nurse Practitioner

## 2019-11-08 DIAGNOSIS — R928 Other abnormal and inconclusive findings on diagnostic imaging of breast: Secondary | ICD-10-CM

## 2019-11-08 DIAGNOSIS — N644 Mastodynia: Secondary | ICD-10-CM

## 2019-11-08 DIAGNOSIS — R599 Enlarged lymph nodes, unspecified: Secondary | ICD-10-CM

## 2019-11-16 ENCOUNTER — Ambulatory Visit
Admission: RE | Admit: 2019-11-16 | Discharge: 2019-11-16 | Disposition: A | Discharge status: Home / Self Care | Payer: Medicare HMO | Source: Ambulatory Visit | Attending: Nurse Practitioner | Admitting: Nurse Practitioner

## 2019-11-16 ENCOUNTER — Other Ambulatory Visit: Payer: Self-pay

## 2019-11-16 DIAGNOSIS — N644 Mastodynia: Secondary | ICD-10-CM

## 2019-11-16 DIAGNOSIS — R599 Enlarged lymph nodes, unspecified: Secondary | ICD-10-CM

## 2019-11-23 ENCOUNTER — Other Ambulatory Visit: Payer: Self-pay | Admitting: Surgery

## 2019-11-23 ENCOUNTER — Ambulatory Visit: Payer: Self-pay | Admitting: Surgery

## 2019-11-23 DIAGNOSIS — C50912 Malignant neoplasm of unspecified site of left female breast: Secondary | ICD-10-CM

## 2019-11-23 NOTE — H&P (Signed)
History of Present Illness Crystal Vega. Crystal Johndrow MD; 11/23/2019 12:15 PM) The patient is a 69 year old female who presents with breast cancer. PCP - Dr. Leslie Andrea  Reason for evaluation: Metastatic breast cancer   This is a 69 year old female with hypertension and type 2 diabetes who presents with swelling, thickening, and tenderness of her left breast extending up into the axilla. These symptoms have been present for several months. She did not mention it to anybody. She has never had a previous mammogram. She has no family history of breast cancer. The tenderness finally became severe enough that she brought it to the attention of a medical provider. On 11/08/19 she underwent bilateral tomo mammogram. Ultrasound was performed at that time. This showed a 6 x 5 x 4.6 cm mass in the upper outer portion of the left breast highly suspicious for malignancy. There are at least 2 markedly abnormal left axillary lymph nodes. She has one abnormal appearing right axillary lymph node. She was also noted to have diffuse left breast edema and skin thickening. She underwent biopsy on 6/18 of all 3 areas. 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 ER positive, PR negative, Ki-67 30-40%. HER-2/neu is negative. The patient was referred to surgery for evaluation. She has not yet been seen by oncology. No MRI has been ordered.  The patient has limited ambulation due to her obesity and pain in both knees. She has no cardiac or pulmonary issues are documented. She has 2 daughters ages 66 and 62. Both of these have already had baseline mammograms.  Diagnosis 1. Breast, left, needle core biopsy, 1 o'clock, 20cm from nipple - INVASIVE DUCTAL CARCINOMA - SEE COMMENT 2. Lymph node, needle/core biopsy, left axilla - INVASIVE DUCTAL CARCINOMA - NO NODAL TISSUE IDENTIFIED - SEE COMMENT 3. Lymph node, needle/core biopsy,  right axilla - METASTATIC CARCINOMA INVOLVING A LYMPH NODE - SEE COMMENT Microscopic Comment 1. Cytokeratin 7, GATA3 and E-cadherin are positive supporting the above diagnosis of invasive ductal carcinoma. Based on the biopsy, the carcinoma appears Nottingham grade 3 of 3 and measures measures 1.6 cm in greatest linear extent. Prognostic markers (ER/PR/ki-67/HER2) are pending and will be reported in an addendum. Dr. Jeannie Done reviewed the case and agrees with the above diagnosis. These results were called to [facility] on November 19, 2019. 3. The neoplastic cells are positive for cytokeratin 7, GATA3 and E-cadherin consistent with invasive ductal carcinoma.Prognostic panel (ER, PR, Ki-67 and HER-2) is pending and will be reported in an addendum. Crystal Sheller MD Pathologist, Electronic Signature (Case signed 11/19/2019)  1. PROGNOSTIC INDICATORS Results: IMMUNOHISTOCHEMICAL AND MORPHOMETRIC ANALYSIS PERFORMED MANUALLY The tumor cells are NEGATIVE for Her2 (1+). Estrogen Receptor: 100%, POSITIVE, STRONG STAINING INTENSITY Progesterone Receptor: 0%, NEGATIVE Proliferation Marker Ki67: 30% COMMENT: The negative hormone receptor study(ies) in this case has no internal positive control. REFERENCE RANGE ESTROGEN RECEPTOR NEGATIVE 0% POSITIVE =>1% REFERENCE RANGE PROGESTERONE RECEPTOR NEGATIVE 0% POSITIVE =>1% All controls stained appropriately Crystal Sheller MD Pathologist, Electronic Signature ( Signed 11/20/2019)  3. PROGNOSTIC INDICATORS Results: IMMUNOHISTOCHEMICAL AND MORPHOMETRIC ANALYSIS PERFORMED MANUALLY The tumor cells are NEGATIVE for Her2 (1+). 1 of 3 FINAL for Crystal, Vega ([id number]) ADDITIONAL INFORMATION:(continued) Estrogen Receptor: 100%, POSITIVE, STRONG STAINING INTENSITY Progesterone Receptor: 0%, NEGATIVE Proliferation Marker Ki67: 40% COMMENT: The negative hormone receptor study(ies) in this case has no internal positive control. REFERENCE  RANGE ESTROGEN RECEPTOR NEGATIVE 0% POSITIVE =>1%  REFERENCE RANGE PROGESTERONE RECEPTOR NEGATIVE 0% POSITIVE =>1% All controls stained appropriately Crystal Sheller MD Pathologist, Electronic Signature ( Signed 11/20/2019)    CLINICAL DATA: Intermittent left breast pain for several months with recent development of left breast swelling. No previous mammograms.  EXAM: DIGITAL DIAGNOSTIC BILATERAL MAMMOGRAM WITH CAD AND TOMO  ULTRASOUND LEFT BREAST AND RIGHT AXILLA  COMPARISON: None.  ACR Breast Density Category b: There are scattered areas of fibroglandular density.  FINDINGS: Diffuse left breast edema and skin thickening. Partially included dense, irregular, mass-like area in axillary tail region of the left breast. Normal sized right axillary lymph nodes with possible diffuse cortical thickening. No findings in the right breast suspicious for malignancy.  Mammographic images were processed with CAD.  On physical exam, the left breast is markedly, diffusely larger than the left breast with peau de orange skin thickening and diffuse firmness of the breast. There is no skin discoloration other than patchy areas of right red skin irritation that appears superficial in the upper outer left breast. There is bulging soft tissue in the inferior left axilla which is hard to palpation. There are no separate palpable left axillary lymph nodes.  Targeted ultrasound is performed, showing diffuse skin thickening and parenchymal edema throughout the upper outer left breast. There is also a large, irregular, heterogeneous, predominantly hypoechoic mass centered in the 1 o'clock position of the breast, 20 cm from the nipple, in the axillary tail region and extending into the inferior left axilla. This has some ill-defined surrounding increased echogenicity and has prominent internal blood flow with power Doppler. This mass measures approximately 6.2 x 5.3 x 4.6 cm.  Adjacent to the  anterior, inferior medial aspect of the large mass in the axillary tail region of the left breast, there is a 9 x 8 x 7 mm mildly irregular, oval, hypoechoic mass with internal blood flow with power Doppler.  In the inferior left axilla, adjacent to the axillary tail breast mass, there are 2 markedly enlarged left axillary lymph nodes with marked diffuse cortical thickening. The deeper no demonstrates loss of the normal fatty hila in the other no demonstrates compression of the fatty hilum. The 2nd node has a maximum cortical thickness of 11.8 mm.  Ultrasound of the right axilla demonstrates a single right axillary lymph node with marked eccentric cortical thickening, measuring 10.7 mm in maximum thickness. There are additional normal appearing right axillary lymph nodes.  IMPRESSION: 1. Approximately 6.2 x 5.3 x 4.6 cm mass in the axillary tail of the left breast with imaging features highly suspicious for malignancy. 2. Adjacent 9 mm mass in the axillary tail of the left breast compatible with a satellite malignancy. 3. At least 2 markedly abnormal left axillary lymph nodes compatible with metastatic nodes. 4. One markedly abnormal appearing right axillary lymph node suspicious for a metastatic node. 5. Diffuse left breast edema and skin thickening. This could be due to lymphatic obstruction or lymphatic and dermal spread of malignancy.  RECOMMENDATION: 1. Ultrasound-guided core needle biopsies of the 6.2 cm mass in the axillary tail of the left breast, one of the markedly abnormal left axillary lymph nodes and the markedly abnormal right axillary lymph node. This has been discussed with the patient and her husband and the biopsies have been scheduled at 10:30 a.m. on 11/16/2019. 2. Consider punch biopsy of the skin of the left breast to assess for lymphatic and dermal spread of malignancy. 3. Depending on the results of the biopsies, post biopsy MRI of the breasts  may be  indicated to assess for extent of disease, including the possibility of pectoralis muscle invasion on the left, and to exclude occult malignancy in the right breast.  I have discussed the findings and recommendations with the patient. If applicable, a reminder letter will be sent to the patient regarding the next appointment.  BI-RADS CATEGORY 5: Highly suggestive of malignancy.   Electronically Signed By: Crystal Vega M.D. On: 11/08/2019 14:25  CLINICAL DATA: Suspicious mass in the 1 o'clock region of the left breast 20 cm from the nipple and enlarged bilateral axillary adenopathy.  EXAM: ULTRASOUND GUIDED BILATERAL BREAST CORE NEEDLE BIOPSIES  COMPARISON: Previous exam(s).  PROCEDURE: I met with the patient and we discussed the procedure of ultrasound-guided biopsy, including benefits and alternatives. We discussed the high likelihood of a successful procedure. We discussed the risks of the procedure, including infection, bleeding, tissue injury, clip migration, and inadequate sampling. Informed written consent was given. The usual time-out protocol was performed immediately prior to the procedure.  Lesion quadrant: LEFT UPPER OUTER QUADRANT (1 O'CLOCK 20 CM FROM THE NIPPLE)  Using sterile technique and 1% lidocaine and 1% lidocaine with epinephrine as local anesthetic, under direct ultrasound visualization, a 12 gauge spring-loaded device was used to perform biopsy of A MASS IN THE 1 O'CLOCK REGION OF THE LEFT BREAST 20 CM FROM THE NIPPLE using a lateral to medial approach. Before tissue samples were obtained a ribbon shaped biopsy marker clip was placed into the mass in the 1 o'clock region of the left breast. I was unsure if the clip was removed with the tissue samples and a second ribbon shaped clip was placed in the mass at 1 o'clock. Follow up 2 view mammogram was performed and dictated separately.  I met with the patient and we discussed the procedure  of ultrasound-guided biopsy, including benefits and alternatives. We discussed the high likelihood of a successful procedure. We discussed the risks of the procedure, including infection, bleeding, tissue injury, clip migration, and inadequate sampling. Informed written consent was given. The usual time-out protocol was performed immediately prior to the procedure.  Lesion quadrant: LEFT AXILLA  Using sterile technique and 1% lidocaine and 1% lidocaine with epinephrine as local anesthetic, under direct ultrasound visualization, a 14 gauge spring-loaded device was used to perform biopsy of A LEFT AXILLARY LYMPH NODE using a lateral to medial approach. At the conclusion of the procedure [facility] tissue marker clip was deployed into the biopsy cavity. Follow up 2 view mammogram was performed and dictated separately.  I met with the patient and we discussed the procedure of ultrasound-guided biopsy, including benefits and alternatives. We discussed the high likelihood of a successful procedure. We discussed the risks of the procedure, including infection, bleeding, tissue injury, clip migration, and inadequate sampling. Informed written consent was given. The usual time-out protocol was performed immediately prior to the procedure.  Lesion quadrant: RIGHT AXILLA  Using sterile technique and 1% lidocaine and 1% lidocaine with epinephrine as local anesthetic, under direct ultrasound visualization, a 14 gauge spring-loaded device was used to perform biopsy of A RIGHT AXILLARY LYMPH NODE using a lateral to medial approach. At the conclusion of the procedure [facility] tissue marker clip was deployed into the biopsy cavity. Follow up 2 view mammogram was performed and dictated separately.  IMPRESSION: Status post ultrasound-guided core biopsies of a mass in the 1 o'clock region of the left breast and bilateral axillary adenopathy.  Electronically Signed: By: Crystal Vega M.D. On:  11/16/2019 11:59  CLINICAL  DATA: Status post ultrasound-guided core biopsy of a mass in the 1 o'clock region of the left breast 20 cm from the nipple and bilateral axillary adenopathy.  EXAM: 3D DIAGNOSTIC BILATERAL MAMMOGRAM POST ULTRASOUND BIOPSIES  COMPARISON: Previous exam(s).  FINDINGS: 3D Mammographic images were obtained following ultrasound guided biopsies of a mass in the 1 o'clock region of the left breast 20 cm from the nipple and bilateral axillary lymph nodes. Mammographic images show there is a ribbon shaped clip in appropriate position in the upper-outer quadrant of the left breast. The HydroMARK clip in the left axillary lymph node was not visualized secondary to the far posterior location. Mammographic images of the right breast show there is a HydroMARK clip in a right axillary lymph node.  IMPRESSION: Status post ultrasound-guided core biopsies of a mass in the 1 o'clock region of the left breast with the ribbon shaped clip in appropriate position. The HydroMARK clip in the left axillary lymph node was not seen on the mammographic images secondary to its far posterior location. Mammographic images of the right axilla show a HydroMARK clip in appropriate position in the axillary lymph node.  Final Assessment: Post Procedure Mammograms for Marker Placement   Electronically Signed By: Crystal Vega M.D. On: 11/16/2019 12:11   Problem List/Past Medical Rodman Key K. Jeania Nater, MD; 11/23/2019 12:15 PM) CARCINOMA OF LEFT BREAST METASTATIC TO AXILLARY LYMPH NODE (C50.912)  Past Surgical History [employer], RMA; 11/23/2019 11:22 AM) Cataract Surgery Right.  Diagnostic Studies History Geni Bers [place], RMA; 11/23/2019 11:22 AM) Colonoscopy never Mammogram within last year Pap Smear 1-5 years ago  Allergies Geni Bers [place], RMA; 11/23/2019 11:22 AM) No Known Drug Allergies [11/23/2019]: Allergies Reconciled  Medication History [employer], RMA; 11/23/2019 11:25 AM) Atorvastatin Calcium (20MG Tablet, Oral) Active. hydroCHLOROthiazide (25MG Tablet, Oral) Active. HYDROcodone-Acetaminophen (5-325MG Tablet, Oral) Active. Ibuprofen (800MG Tablet, Oral) Active. Losartan Potassium (50MG Tablet, Oral) Active. metFORMIN HCl (500MG Tablet, Oral) Active. Potassium Chloride (10MEQ Capsule ER, Oral) Active. Medications Reconciled  Social History Geni Bers [place], RMA; 11/23/2019 11:22 AM) Caffeine use Coffee. No alcohol use No drug use  Family History Geni Bers [place], RMA; 11/23/2019 11:22 AM) Cervical Cancer Daughter. Colon Cancer Mother. Diabetes Mellitus Father. Hypertension Father.  Pregnancy / Birth History Geni Bers [place], RMA; 11/23/2019 11:22 AM) Age at menarche 40 years. Age of menopause 59-50 Gravida 2 Maternal age 2-25 Para 2  Other Problems Crystal Vega. Crystal Yurkovich, MD; 11/23/2019 12:15 PM) Arthritis Back Pain Breast Cancer Diabetes Mellitus High blood pressure Hypercholesterolemia Lump In Breast     Review of Systems (Jacqueline Haggett RMA; 11/23/2019 11:22 AM) General Present- Fatigue and Night Sweats. Not Present- Appetite Loss, Chills, Fever, Weight Gain and Weight Loss. Skin Present- Dryness. Not Present- Change in Wart/Mole, Hives, Jaundice, New Lesions, Non-Healing Wounds, Rash and Ulcer. HEENT Present- Wears glasses/contact lenses. Not Present- Earache, Hearing Loss, Hoarseness, Nose Bleed, Oral Ulcers, Ringing in the Ears, Seasonal Allergies, Sinus Pain, Sore Throat, Visual Disturbances and Yellow Eyes. Breast Present- Breast Mass, Breast Pain and Skin Changes. Not Present- Nipple Discharge. Cardiovascular Present- Shortness of Breath. Not Present- Chest Pain, Difficulty Breathing Lying Down, Leg Cramps, Palpitations, Rapid Heart Rate and Swelling of Extremities. Gastrointestinal Present- Change in Bowel Habits. Not Present- Abdominal Pain, Bloating, Bloody Stool,  Chronic diarrhea, Constipation, Difficulty Swallowing, Excessive gas, Gets full quickly at meals, Hemorrhoids, Indigestion, Nausea, Rectal Pain and Vomiting. Female Genitourinary Present- Nocturia. Not Present- Frequency, Painful Urination, Pelvic Pain and Urgency. Musculoskeletal Present- Back Pain, Joint Stiffness and Muscle Weakness. Not Present- Joint Pain,  Muscle Pain and Swelling of Extremities. Neurological Present- Numbness and Weakness. Not Present- Decreased Memory, Fainting, Headaches, Seizures, Tingling, Tremor and Trouble walking. Psychiatric Not Present- Anxiety, Bipolar, Change in Sleep Pattern, Depression, Fearful and Frequent crying. Hematology Not Present- Blood Thinners, Easy Bruising, Excessive bleeding, Gland problems, HIV and Persistent Infections.  Vitals [employer] Haggett RMA; 11/23/2019 11:25 AM) 11/23/2019 11:21 AM Weight: 287 lb Height: 63in Body Surface Area: 2.25 m Body Mass Index: 50.84 kg/m  Temp.: 98.56F(Temporal)  Pulse: 93 (Regular)  P.OX: 97% (Room air)        Physical Exam Rodman Key K. Amaranta Mehl MD; 11/23/2019 12:17 PM)  The physical exam findings are as follows: Note:Constitutional: Obese female in NAD, conversant, no obvious deformities; resting comfortably Eyes: Pupils equal, round; sclera anicteric; moist conjunctiva; no lid lag HENT: Oral mucosa moist; good dentition Neck: No masses palpated, trachea midline; no thyromegaly Lungs: CTA bilaterally; normal respiratory effort Breasts: pendulous, asymmetric - Left much larger than right Right breast - no palpable masses, no nipple changes; some palpable lymph nodes Left breast - generalized thickening and peau d'orange changes of the skin; firm breast tissue in the LUOQ extending into the axilla; large palpable left axillary lymph nodes CV: Regular rate and rhythm; no murmurs; extremities well-perfused with no edema Abd: +bowel sounds, soft, non-tender, no palpable organomegaly; no  palpable hernias Musc: Normal gait; no apparent clubbing or cyanosis in extremities Lymphatic: No palpable cervical or axillary lymphadenopathy Skin: Warm, dry; no sign of jaundice Psychiatric - alert and oriented x 4; calm mood and affect    Assessment & Plan Rodman Key K. Otie Headlee MD; 11/23/2019 12:18 PM)  CARCINOMA OF LEFT BREAST METASTATIC TO AXILLARY LYMPH NODE (C50.912)  Current Plans MRI, BOTH BREASTS ([id number]) Referred to Oncology, for evaluation and follow up (Oncology). Routine. Schedule for Surgery - Ultrasound guided port placement. The surgical procedure has been discussed with the patient. Potential risks, benefits, alternative treatments, and expected outcomes have been explained. All of the patient's questions at this time have been answered. The likelihood of reaching the patient's treatment goal is good. The patient understand the proposed surgical procedure and wishes to proceed. Note:We will urgently refer the patient to oncology for discussion of neoadjuvant chemotherapy. I explained to the patient at this time that surgical intervention would not be helpful. We would have significant difficulty trying to get her to heal a left mastectomy incision at this time. This would also do nothing to treat her metastatic disease. We will begin her staging first by obtaining bilateral breast MRI to rule out right-sided malignancy. Oncology will likely obtain further staging CTs. We discussed placement of a port to facilitate chemotherapy. We will discuss the findings of her MRI further by telephone. We will discuss her case at breast conference.  Crystal Vega. Georgette Dover, MD, [facility] Surgery  General/ Trauma Surgery   11/23/2019 12:19 PM

## 2019-11-23 NOTE — H&P (View-Only) (Signed)
History of Present Illness Crystal Vega. Crystal Higinbotham MD; 11/23/2019 12:15 PM) The patient is a 69 year old female who presents with breast cancer. PCP - Dr. Leslie Andrea  Reason for evaluation: Metastatic breast cancer   This is a 69 year old female with hypertension and type 2 diabetes who presents with swelling, thickening, and tenderness of her left breast extending up into the axilla. These symptoms have been present for several months. She did not mention it to anybody. She has never had a previous mammogram. She has no family history of breast cancer. The tenderness finally became severe enough that she brought it to the attention of a medical provider. On 11/08/19 she underwent bilateral tomo mammogram. Ultrasound was performed at that time. This showed a 6 x 5 x 4.6 cm mass in the upper outer portion of the left breast highly suspicious for malignancy. There are at least 2 markedly abnormal left axillary lymph nodes. She has one abnormal appearing right axillary lymph node. She was also noted to have diffuse left breast edema and skin thickening. She underwent biopsy on 6/18 of all 3 areas. 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 ER positive, PR negative, Ki-67 30-40%. HER-2/neu is negative. The patient was referred to surgery for evaluation. She has not yet been seen by oncology. No MRI has been ordered.  The patient has limited ambulation due to her obesity and pain in both knees. She has no cardiac or pulmonary issues are documented. She has 2 daughters ages 64 and 63. Both of these have already had baseline mammograms.  Diagnosis 1. Breast, left, needle core biopsy, 1 o'clock, 20cm from nipple - INVASIVE DUCTAL CARCINOMA - SEE COMMENT 2. Lymph node, needle/core biopsy, left axilla - INVASIVE DUCTAL CARCINOMA - NO NODAL TISSUE IDENTIFIED - SEE COMMENT 3. Lymph node, needle/core biopsy,  right axilla - METASTATIC CARCINOMA INVOLVING A LYMPH NODE - SEE COMMENT Microscopic Comment 1. Cytokeratin 7, GATA3 and E-cadherin are positive supporting the above diagnosis of invasive ductal carcinoma. Based on the biopsy, the carcinoma appears Nottingham grade 3 of 3 and measures measures 1.6 cm in greatest linear extent. Prognostic markers (ER/PR/ki-67/HER2) are pending and will be reported in an addendum. Dr. Jeannie Done reviewed the case and agrees with the above diagnosis. These results were called to [facility] on November 19, 2019. 3. The neoplastic cells are positive for cytokeratin 7, GATA3 and E-cadherin consistent with invasive ductal carcinoma.Prognostic panel (ER, PR, Ki-67 and HER-2) is pending and will be reported in an addendum. Thressa Sheller MD Pathologist, Electronic Signature (Case signed 11/19/2019)  1. PROGNOSTIC INDICATORS Results: IMMUNOHISTOCHEMICAL AND MORPHOMETRIC ANALYSIS PERFORMED MANUALLY The tumor cells are NEGATIVE for Her2 (1+). Estrogen Receptor: 100%, POSITIVE, STRONG STAINING INTENSITY Progesterone Receptor: 0%, NEGATIVE Proliferation Marker Ki67: 30% COMMENT: The negative hormone receptor study(ies) in this case has no internal positive control. REFERENCE RANGE ESTROGEN RECEPTOR NEGATIVE 0% POSITIVE =>1% REFERENCE RANGE PROGESTERONE RECEPTOR NEGATIVE 0% POSITIVE =>1% All controls stained appropriately Thressa Sheller MD Pathologist, Electronic Signature ( Signed 11/20/2019)  3. PROGNOSTIC INDICATORS Results: IMMUNOHISTOCHEMICAL AND MORPHOMETRIC ANALYSIS PERFORMED MANUALLY The tumor cells are NEGATIVE for Her2 (1+). 1 of 3 FINAL for Crystal Vega, Crystal Vega ([id number]) ADDITIONAL INFORMATION:(continued) Estrogen Receptor: 100%, POSITIVE, STRONG STAINING INTENSITY Progesterone Receptor: 0%, NEGATIVE Proliferation Marker Ki67: 40% COMMENT: The negative hormone receptor study(ies) in this case has no internal positive control. REFERENCE  RANGE ESTROGEN RECEPTOR NEGATIVE 0% POSITIVE =>1%  REFERENCE RANGE PROGESTERONE RECEPTOR NEGATIVE 0% POSITIVE =>1% All controls stained appropriately Thressa Sheller MD Pathologist, Electronic Signature ( Signed 11/20/2019)    CLINICAL DATA: Intermittent left breast pain for several months with recent development of left breast swelling. No previous mammograms.  EXAM: DIGITAL DIAGNOSTIC BILATERAL MAMMOGRAM WITH CAD AND TOMO  ULTRASOUND LEFT BREAST AND RIGHT AXILLA  COMPARISON: None.  ACR Breast Density Category b: There are scattered areas of fibroglandular density.  FINDINGS: Diffuse left breast edema and skin thickening. Partially included dense, irregular, mass-like area in axillary tail region of the left breast. Normal sized right axillary lymph nodes with possible diffuse cortical thickening. No findings in the right breast suspicious for malignancy.  Mammographic images were processed with CAD.  On physical exam, the left breast is markedly, diffusely larger than the left breast with peau de orange skin thickening and diffuse firmness of the breast. There is no skin discoloration other than patchy areas of right red skin irritation that appears superficial in the upper outer left breast. There is bulging soft tissue in the inferior left axilla which is hard to palpation. There are no separate palpable left axillary lymph nodes.  Targeted ultrasound is performed, showing diffuse skin thickening and parenchymal edema throughout the upper outer left breast. There is also a large, irregular, heterogeneous, predominantly hypoechoic mass centered in the 1 o'clock position of the breast, 20 cm from the nipple, in the axillary tail region and extending into the inferior left axilla. This has some ill-defined surrounding increased echogenicity and has prominent internal blood flow with power Doppler. This mass measures approximately 6.2 x 5.3 x 4.6 cm.  Adjacent to the  anterior, inferior medial aspect of the large mass in the axillary tail region of the left breast, there is a 9 x 8 x 7 mm mildly irregular, oval, hypoechoic mass with internal blood flow with power Doppler.  In the inferior left axilla, adjacent to the axillary tail breast mass, there are 2 markedly enlarged left axillary lymph nodes with marked diffuse cortical thickening. The deeper no demonstrates loss of the normal fatty hila in the other no demonstrates compression of the fatty hilum. The 2nd node has a maximum cortical thickness of 11.8 mm.  Ultrasound of the right axilla demonstrates a single right axillary lymph node with marked eccentric cortical thickening, measuring 10.7 mm in maximum thickness. There are additional normal appearing right axillary lymph nodes.  IMPRESSION: 1. Approximately 6.2 x 5.3 x 4.6 cm mass in the axillary tail of the left breast with imaging features highly suspicious for malignancy. 2. Adjacent 9 mm mass in the axillary tail of the left breast compatible with a satellite malignancy. 3. At least 2 markedly abnormal left axillary lymph nodes compatible with metastatic nodes. 4. One markedly abnormal appearing right axillary lymph node suspicious for a metastatic node. 5. Diffuse left breast edema and skin thickening. This could be due to lymphatic obstruction or lymphatic and dermal spread of malignancy.  RECOMMENDATION: 1. Ultrasound-guided core needle biopsies of the 6.2 cm mass in the axillary tail of the left breast, one of the markedly abnormal left axillary lymph nodes and the markedly abnormal right axillary lymph node. This has been discussed with the patient and her husband and the biopsies have been scheduled at 10:30 a.m. on 11/16/2019. 2. Consider punch biopsy of the skin of the left breast to assess for lymphatic and dermal spread of malignancy. 3. Depending on the results of the biopsies, post biopsy MRI of the breasts  may be  indicated to assess for extent of disease, including the possibility of pectoralis muscle invasion on the left, and to exclude occult malignancy in the right breast.  I have discussed the findings and recommendations with the patient. If applicable, a reminder letter will be sent to the patient regarding the next appointment.  BI-RADS CATEGORY 5: Highly suggestive of malignancy.   Electronically Signed By: Claudie Revering M.D. On: 11/08/2019 14:25  CLINICAL DATA: Suspicious mass in the 1 o'clock region of the left breast 20 cm from the nipple and enlarged bilateral axillary adenopathy.  EXAM: ULTRASOUND GUIDED BILATERAL BREAST CORE NEEDLE BIOPSIES  COMPARISON: Previous exam(s).  PROCEDURE: I met with the patient and we discussed the procedure of ultrasound-guided biopsy, including benefits and alternatives. We discussed the high likelihood of a successful procedure. We discussed the risks of the procedure, including infection, bleeding, tissue injury, clip migration, and inadequate sampling. Informed written consent was given. The usual time-out protocol was performed immediately prior to the procedure.  Lesion quadrant: LEFT UPPER OUTER QUADRANT (1 O'CLOCK 20 CM FROM THE NIPPLE)  Using sterile technique and 1% lidocaine and 1% lidocaine with epinephrine as local anesthetic, under direct ultrasound visualization, a 12 gauge spring-loaded device was used to perform biopsy of A MASS IN THE 1 O'CLOCK REGION OF THE LEFT BREAST 20 CM FROM THE NIPPLE using a lateral to medial approach. Before tissue samples were obtained a ribbon shaped biopsy marker clip was placed into the mass in the 1 o'clock region of the left breast. I was unsure if the clip was removed with the tissue samples and a second ribbon shaped clip was placed in the mass at 1 o'clock. Follow up 2 view mammogram was performed and dictated separately.  I met with the patient and we discussed the procedure  of ultrasound-guided biopsy, including benefits and alternatives. We discussed the high likelihood of a successful procedure. We discussed the risks of the procedure, including infection, bleeding, tissue injury, clip migration, and inadequate sampling. Informed written consent was given. The usual time-out protocol was performed immediately prior to the procedure.  Lesion quadrant: LEFT AXILLA  Using sterile technique and 1% lidocaine and 1% lidocaine with epinephrine as local anesthetic, under direct ultrasound visualization, a 14 gauge spring-loaded device was used to perform biopsy of A LEFT AXILLARY LYMPH NODE using a lateral to medial approach. At the conclusion of the procedure [facility] tissue marker clip was deployed into the biopsy cavity. Follow up 2 view mammogram was performed and dictated separately.  I met with the patient and we discussed the procedure of ultrasound-guided biopsy, including benefits and alternatives. We discussed the high likelihood of a successful procedure. We discussed the risks of the procedure, including infection, bleeding, tissue injury, clip migration, and inadequate sampling. Informed written consent was given. The usual time-out protocol was performed immediately prior to the procedure.  Lesion quadrant: RIGHT AXILLA  Using sterile technique and 1% lidocaine and 1% lidocaine with epinephrine as local anesthetic, under direct ultrasound visualization, a 14 gauge spring-loaded device was used to perform biopsy of A RIGHT AXILLARY LYMPH NODE using a lateral to medial approach. At the conclusion of the procedure [facility] tissue marker clip was deployed into the biopsy cavity. Follow up 2 view mammogram was performed and dictated separately.  IMPRESSION: Status post ultrasound-guided core biopsies of a mass in the 1 o'clock region of the left breast and bilateral axillary adenopathy.  Electronically Signed: By: Lillia Mountain M.D. On:  11/16/2019 11:59  CLINICAL  DATA: Status post ultrasound-guided core biopsy of a mass in the 1 o'clock region of the left breast 20 cm from the nipple and bilateral axillary adenopathy.  EXAM: 3D DIAGNOSTIC BILATERAL MAMMOGRAM POST ULTRASOUND BIOPSIES  COMPARISON: Previous exam(s).  FINDINGS: 3D Mammographic images were obtained following ultrasound guided biopsies of a mass in the 1 o'clock region of the left breast 20 cm from the nipple and bilateral axillary lymph nodes. Mammographic images show there is a ribbon shaped clip in appropriate position in the upper-outer quadrant of the left breast. The HydroMARK clip in the left axillary lymph node was not visualized secondary to the far posterior location. Mammographic images of the right breast show there is a HydroMARK clip in a right axillary lymph node.  IMPRESSION: Status post ultrasound-guided core biopsies of a mass in the 1 o'clock region of the left breast with the ribbon shaped clip in appropriate position. The HydroMARK clip in the left axillary lymph node was not seen on the mammographic images secondary to its far posterior location. Mammographic images of the right axilla show a HydroMARK clip in appropriate position in the axillary lymph node.  Final Assessment: Post Procedure Mammograms for Marker Placement   Electronically Signed By: Lillia Mountain M.D. On: 11/16/2019 12:11   Problem List/Past Medical Rodman Key K. Elchonon Maxson, MD; 11/23/2019 12:15 PM) CARCINOMA OF LEFT BREAST METASTATIC TO AXILLARY LYMPH NODE (C50.912)  Past Surgical History [employer], RMA; 11/23/2019 11:22 AM) Cataract Surgery Right.  Diagnostic Studies History Geni Bers [place], RMA; 11/23/2019 11:22 AM) Colonoscopy never Mammogram within last year Pap Smear 1-5 years ago  Allergies Geni Bers [place], RMA; 11/23/2019 11:22 AM) No Known Drug Allergies [11/23/2019]: Allergies Reconciled  Medication History [employer], RMA; 11/23/2019 11:25 AM) Atorvastatin Calcium (20MG Tablet, Oral) Active. hydroCHLOROthiazide (25MG Tablet, Oral) Active. HYDROcodone-Acetaminophen (5-325MG Tablet, Oral) Active. Ibuprofen (800MG Tablet, Oral) Active. Losartan Potassium (50MG Tablet, Oral) Active. metFORMIN HCl (500MG Tablet, Oral) Active. Potassium Chloride (10MEQ Capsule ER, Oral) Active. Medications Reconciled  Social History Geni Bers [place], RMA; 11/23/2019 11:22 AM) Caffeine use Coffee. No alcohol use No drug use  Family History Geni Bers [place], RMA; 11/23/2019 11:22 AM) Cervical Cancer Daughter. Colon Cancer Mother. Diabetes Mellitus Father. Hypertension Father.  Pregnancy / Birth History Geni Bers [place], RMA; 11/23/2019 11:22 AM) Age at menarche 26 years. Age of menopause 61-50 Gravida 2 Maternal age 85-25 Para 2  Other Problems Crystal Vega. Ardelia Wrede, MD; 11/23/2019 12:15 PM) Arthritis Back Pain Breast Cancer Diabetes Mellitus High blood pressure Hypercholesterolemia Lump In Breast     Review of Systems (Jacqueline Haggett RMA; 11/23/2019 11:22 AM) General Present- Fatigue and Night Sweats. Not Present- Appetite Loss, Chills, Fever, Weight Gain and Weight Loss. Skin Present- Dryness. Not Present- Change in Wart/Mole, Hives, Jaundice, New Lesions, Non-Healing Wounds, Rash and Ulcer. HEENT Present- Wears glasses/contact lenses. Not Present- Earache, Hearing Loss, Hoarseness, Nose Bleed, Oral Ulcers, Ringing in the Ears, Seasonal Allergies, Sinus Pain, Sore Throat, Visual Disturbances and Yellow Eyes. Breast Present- Breast Mass, Breast Pain and Skin Changes. Not Present- Nipple Discharge. Cardiovascular Present- Shortness of Breath. Not Present- Chest Pain, Difficulty Breathing Lying Down, Leg Cramps, Palpitations, Rapid Heart Rate and Swelling of Extremities. Gastrointestinal Present- Change in Bowel Habits. Not Present- Abdominal Pain, Bloating, Bloody Stool,  Chronic diarrhea, Constipation, Difficulty Swallowing, Excessive gas, Gets full quickly at meals, Hemorrhoids, Indigestion, Nausea, Rectal Pain and Vomiting. Female Genitourinary Present- Nocturia. Not Present- Frequency, Painful Urination, Pelvic Pain and Urgency. Musculoskeletal Present- Back Pain, Joint Stiffness and Muscle Weakness. Not Present- Joint Pain,  Muscle Pain and Swelling of Extremities. Neurological Present- Numbness and Weakness. Not Present- Decreased Memory, Fainting, Headaches, Seizures, Tingling, Tremor and Trouble walking. Psychiatric Not Present- Anxiety, Bipolar, Change in Sleep Pattern, Depression, Fearful and Frequent crying. Hematology Not Present- Blood Thinners, Easy Bruising, Excessive bleeding, Gland problems, HIV and Persistent Infections.  Vitals [employer] Haggett RMA; 11/23/2019 11:25 AM) 11/23/2019 11:21 AM Weight: 287 lb Height: 63in Body Surface Area: 2.25 m Body Mass Index: 50.84 kg/m  Temp.: 98.81F(Temporal)  Pulse: 93 (Regular)  P.OX: 97% (Room air)        Physical Exam Rodman Key K. Najib Colmenares MD; 11/23/2019 12:17 PM)  The physical exam findings are as follows: Note:Constitutional: Obese female in NAD, conversant, no obvious deformities; resting comfortably Eyes: Pupils equal, round; sclera anicteric; moist conjunctiva; no lid lag HENT: Oral mucosa moist; good dentition Neck: No masses palpated, trachea midline; no thyromegaly Lungs: CTA bilaterally; normal respiratory effort Breasts: pendulous, asymmetric - Left much larger than right Right breast - no palpable masses, no nipple changes; some palpable lymph nodes Left breast - generalized thickening and peau d'orange changes of the skin; firm breast tissue in the LUOQ extending into the axilla; large palpable left axillary lymph nodes CV: Regular rate and rhythm; no murmurs; extremities well-perfused with no edema Abd: +bowel sounds, soft, non-tender, no palpable organomegaly; no  palpable hernias Musc: Normal gait; no apparent clubbing or cyanosis in extremities Lymphatic: No palpable cervical or axillary lymphadenopathy Skin: Warm, dry; no sign of jaundice Psychiatric - alert and oriented x 4; calm mood and affect    Assessment & Plan Rodman Key K. Ehan Freas MD; 11/23/2019 12:18 PM)  CARCINOMA OF LEFT BREAST METASTATIC TO AXILLARY LYMPH NODE (C50.912)  Current Plans MRI, BOTH BREASTS ([id number]) Referred to Oncology, for evaluation and follow up (Oncology). Routine. Schedule for Surgery - Ultrasound guided port placement. The surgical procedure has been discussed with the patient. Potential risks, benefits, alternative treatments, and expected outcomes have been explained. All of the patient's questions at this time have been answered. The likelihood of reaching the patient's treatment goal is good. The patient understand the proposed surgical procedure and wishes to proceed. Note:We will urgently refer the patient to oncology for discussion of neoadjuvant chemotherapy. I explained to the patient at this time that surgical intervention would not be helpful. We would have significant difficulty trying to get her to heal a left mastectomy incision at this time. This would also do nothing to treat her metastatic disease. We will begin her staging first by obtaining bilateral breast MRI to rule out right-sided malignancy. Oncology will likely obtain further staging CTs. We discussed placement of a port to facilitate chemotherapy. We will discuss the findings of her MRI further by telephone. We will discuss her case at breast conference.  Crystal Vega. Georgette Dover, MD, [facility] Surgery  General/ Trauma Surgery   11/23/2019 12:19 PM

## 2019-11-26 ENCOUNTER — Telehealth: Payer: Self-pay | Admitting: Hematology and Oncology

## 2019-11-26 NOTE — Telephone Encounter (Signed)
Received a new pt referral from Dr. Georgette Dover from [place] for a dx of breast cancer. Crystal Vega has been cld and scheduled to see Dr. Lindi Adie on 7/6 at 4pm. Pt aware to arrive 15 minutes early.

## 2019-11-28 ENCOUNTER — Encounter: Payer: Self-pay | Admitting: Adult Health

## 2019-11-28 ENCOUNTER — Telehealth: Payer: Self-pay | Admitting: Hematology and Oncology

## 2019-11-28 DIAGNOSIS — C50611 Malignant neoplasm of axillary tail of right female breast: Secondary | ICD-10-CM | POA: Insufficient documentation

## 2019-11-28 DIAGNOSIS — Z170 Estrogen receptor positive status [ER+]: Secondary | ICD-10-CM | POA: Insufficient documentation

## 2019-11-28 NOTE — Progress Notes (Signed)
[place] CONSULT NOTE  Patient Care Team: Lemmie Evens, MD as PCP - General (Family Medicine) Gala Romney Cristopher Estimable, MD as Consulting Physician (Gastroenterology)  CHIEF COMPLAINTS/PURPOSE OF CONSULTATION:  Newly diagnosed breast cancer  HISTORY OF PRESENTING ILLNESS:  Crystal Vega 69 y.o. female is here because of recent diagnosis of invasive ductal carcinoma of the left breast. Patient noted intermittent left breast pain and swelling for several months at least for the past 8 to 9 months. Mammogram on 11/08/19 showed a 6.2cm mass in the left breast axillary tail with an adjacent 0.9cm mass, 2 abnormal left axillary lymph nodes, 1 abnormal right axillary lymph node, and diffuse left breast edema and skin thickening. Biopsy on 11/16/19 showed invasive ductal carcinoma, grade 3 in the left breast and bilateral axillas, HER-2 negative (1+), ER+ 100%, PR- 0%, Ki67 30%. She presents to the clinic today for initial evaluation and discussion of treatment options.   I reviewed her records extensively and collaborated the history with the patient.  SUMMARY OF ONCOLOGIC HISTORY: Oncology History  Malignant neoplasm of overlapping sites of left breast in female, estrogen receptor positive ([place])  11/16/2019 Cancer Staging   Staging form: Breast, AJCC 8th Edition - Clinical stage from 11/16/2019: Stage IIIC (cT4b, cN1, cM0, G3, ER+, PR-, HER2-) - Signed by [name], NP on 11/28/2019   11/28/2019 Initial Diagnosis   Patient noted intermittent left breast pain and swelling for several months. Mammogram showed a 6.2cm mass in the left breast axillary tail with an adjacent 0.9cm mass, 2 abnormal left axillary lymph nodes, 1 abnormal right axillary lymph node, and diffuse left breast edema and skin thickening. Biopsy showed IDC, grade 3 in the left breast and bilateral axillas, HER-2 negative (1+), ER+ 100%, PR- 0%, Ki67 30%.    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 [facility])      MEDICAL HISTORY:  Past Medical History:  Diagnosis Date   Essential hypertension, benign    Type 2 diabetes mellitus ([place])     SURGICAL HISTORY: Past Surgical History:  Procedure Laterality Date   HYSTEROSCOPY WITH D & C N/A 06/05/2014   Procedure: DILATATION AND CURETTAGE /HYSTEROSCOPY;  Surgeon: Florian Buff, MD;  Location: [facility];  Service: Gynecology;  Laterality: N/A;   POLYPECTOMY N/A 06/05/2014   Procedure: ENDOMETRIAL POLYPECTOMY;  Surgeon: Florian Buff, MD;  Location: [facility];  Service: Gynecology;  Laterality: N/A;   TUBAL LIGATION      SOCIAL HISTORY: Social History   Socioeconomic History   Marital status: Married    Spouse name: Not on file   Number of children: Not on file   Years of education: Not on file   Highest education level: Not on file  Occupational History   Not on file  Tobacco Use   Smoking status: Never Smoker   Smokeless tobacco: Never Used  Substance and Sexual Activity   Alcohol use: No   Drug use: No   Sexual activity: Not on file  Other Topics Concern   Not on file  Social History Narrative   Not on file   Social Determinants of Health   Financial Resource Strain:    Difficulty  of Paying Living Expenses:   Food Insecurity:    Worried About Charity fundraiser in the Last Year:    Arboriculturist in the Last Year:   Transportation Needs:    Film/video editor (Medical):    Lack of Transportation (Non-Medical):   Physical Activity:    Days of Exercise per Week:    Minutes of Exercise per Session:   Stress:    Feeling of Stress :   Social Connections:    Frequency of Communication with Friends and  Family:    Frequency of Social Gatherings with Friends and Family:    Attends Religious Services:    Active Member of Clubs or Organizations:    Attends Music therapist:    Marital Status:   Intimate Partner Violence:    Fear of Current or Ex-Partner:    Emotionally Abused:    Physically Abused:    Sexually Abused:     FAMILY HISTORY: Family History  Problem Relation Age of Onset   Diabetes Mellitus II Father    Colon cancer Mother        Deceased age 62    ALLERGIES:  has No Known Allergies.  MEDICATIONS:  Current Outpatient Medications  Medication Sig Dispense Refill   atorvastatin (LIPITOR) 20 MG tablet Take 1 tablet by mouth daily.  3   hydrochlorothiazide (HYDRODIURIL) 25 MG tablet Take 25 mg by mouth daily.     HYDROcodone-acetaminophen (NORCO/VICODIN) 5-325 MG tablet Take 1 tablet by mouth as needed.  0   ibuprofen (ADVIL,MOTRIN) 800 MG tablet Take 800 mg by mouth 3 (three) times daily.      losartan (COZAAR) 50 MG tablet Take 50 mg by mouth daily.     metFORMIN (GLUCOPHAGE) 500 MG tablet Take 500 mg by mouth 2 (two) times daily.      potassium chloride (MICRO-K) 10 MEQ CR capsule Take 1 capsule by mouth daily.     No current facility-administered medications for this visit.    REVIEW OF SYSTEMS:   Constitutional: Denies fevers, chills or abnormal night sweats Eyes: Denies blurriness of vision, double vision or watery eyes Ears, nose, mouth, throat, and face: Denies mucositis or sore throat Respiratory: Denies cough, dyspnea or wheezes Cardiovascular: Denies palpitation, chest discomfort or lower extremity swelling Gastrointestinal:  Denies nausea, heartburn or change in bowel habits Skin: Denies abnormal skin rashes Lymphatics: Denies new lymphadenopathy or easy bruising Neurological:Denies numbness, tingling or new weaknesses Behavioral/Psych: Mood is stable, no new changes  Breast: Left breast edema, skin thickening All other  systems were reviewed with the patient and are negative.  PHYSICAL EXAMINATION: ECOG PERFORMANCE STATUS: 2 - Symptomatic, <50% confined to bed  Vitals:   11/29/19 1128  BP: 137/82  Pulse: 99  Resp: 16  Temp: 98.3 F (36.8 C)  SpO2: 97%   Filed Weights   11/29/19 1128  Weight: 292 lb 12.8 oz (132.8 kg)    GENERAL:alert, no distress and comfortable SKIN: skin color, texture, turgor are normal, no rashes or significant lesions EYES: normal, conjunctiva are pink and non-injected, sclera clear OROPHARYNX:no exudate, no erythema and lips, buccal mucosa, and tongue normal  NECK: supple, thyroid normal size, non-tender, without nodularity LYMPH:  no palpable lymphadenopathy in the cervical, axillary or inguinal LUNGS: clear to auscultation and percussion with normal breathing effort HEART: regular rate & rhythm and no murmurs and no lower extremity edema ABDOMEN:abdomen soft, non-tender and normal bowel sounds Musculoskeletal:no cyanosis of digits and no clubbing  PSYCH: alert & oriented x 3 with fluent speech NEURO: no focal motor/sensory deficits, complains of numbness in her left thumb with pain radiating up her arm BREAST: Markedly enlarged left breast with severe lymphedema as well as red spots throughout the middle and upper outer quadrant of the breast suggestive of cutaneous infiltration of breast cancer and possibly inflammatory change.  LABORATORY DATA:  I have reviewed the data as listed Lab Results  Component Value Date   WBC 7.2 05/29/2014   HGB 10.9 (L) 05/29/2014   HCT 34.3 (L) 05/29/2014   MCV 86.8 05/29/2014   PLT 272 05/29/2014   Lab Results  Component Value Date   NA 139 05/29/2014   K 4.0 05/29/2014   CL 107 05/29/2014   CO2 27 05/29/2014    RADIOGRAPHIC STUDIES: I have personally reviewed the radiological reports and agreed with the findings in the report.  ASSESSMENT AND PLAN:  Malignant neoplasm of overlapping sites of left breast in female,  estrogen receptor positive ([place]) Patient noted intermittent left breast pain and swelling for several months. Mammogram showed a 6.2cm mass in the left breast axillary tail with an adjacent 0.9cm mass, 2 abnormal left axillary lymph nodes, 1 abnormal right axillary lymph node, and diffuse left breast edema and skin thickening. Biopsy showed IDC, grade 3 in the left breast and bilateral axillas, HER-2 negative (1+), ER+ 100%, PR- 0%, Ki67 30%.  T4 BN 1 stage IIIc Right axillary lymph node biopsy positive Breast MRI will need to be done to evaluate if there is disease in the right breast.  Pathology and radiology counseling: Discussed with the patient, the details of pathology including the type of breast cancer,the clinical staging, the significance of ER, PR and HER-2/neu receptors and the implications for treatment. After reviewing the pathology in detail, we proceeded to discuss the different treatment options between surgery, radiation, chemotherapy, antiestrogen therapies.  Recommendation based on multidisciplinary tumor board: Assuming that there are no distant metastases the treatment plan includes  1. Neoadjuvant chemotherapy with Adriamycin and Cytoxan dose dense 4 followed by Taxol weekly 12  2. Followed by mastectomy with targeted axillary dissection, patient will need bilateral axillary lymph node surgeries.   3. Followed by adjuvant radiation therapy 4.  Followed by adjuvant antiestrogen therapy  Chemotherapy Counseling: I discussed the risks and benefits of chemotherapy including the risks of nausea/ vomiting, risk of infection from low WBC count, fatigue due to chemo or anemia, bruising or bleeding due to low platelets, mouth sores, loss/ change in taste and decreased appetite. Liver and kidney function will be monitored through out chemotherapy as abnormalities in liver and kidney function may be a side effect of treatment.  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 Risk of permanent bone marrow dysfunction due to chemo were also discussed.  Plan: 1. Port placement  2. Echocardiogram 3. Chemotherapy class 4. Breast MRI 5. CT chest abdomen pelvis and bone scan for staging   Patient wishes to receive this treatment at [facility]. I will request Dr. Delton Coombes to see her and resume her care. I will inform her navigators to assist with transition to [facility] cancer center.  Patient has a port placement on 12/14/2019.  Soon after she can start chemotherapy.   All questions were answered. The patient knows to call the clinic with any problems, questions or concerns.   Rulon Eisenmenger, MD, MPH 11/29/2019    I, Molly Dorshimer,  am acting as scribe for Nicholas Lose, MD.  I have reviewed the above documentation for accuracy and completeness, and I agree with the above.

## 2019-11-28 NOTE — Telephone Encounter (Signed)
Pt has been rescheduled to see Dr. Lindi Adie to 7/1 at 11:45am. Pt aware to arrive 15 minutes early.

## 2019-11-29 ENCOUNTER — Other Ambulatory Visit: Payer: Self-pay

## 2019-11-29 ENCOUNTER — Inpatient Hospital Stay: Payer: Medicare HMO | Attending: Hematology and Oncology | Admitting: Hematology and Oncology

## 2019-11-29 ENCOUNTER — Encounter (HOSPITAL_COMMUNITY): Payer: Self-pay | Admitting: *Deleted

## 2019-11-29 ENCOUNTER — Encounter: Payer: Self-pay | Admitting: *Deleted

## 2019-11-29 ENCOUNTER — Telehealth: Payer: Self-pay | Admitting: Hematology and Oncology

## 2019-11-29 ENCOUNTER — Other Ambulatory Visit: Payer: Self-pay | Admitting: *Deleted

## 2019-11-29 DIAGNOSIS — E119 Type 2 diabetes mellitus without complications: Secondary | ICD-10-CM | POA: Diagnosis not present

## 2019-11-29 DIAGNOSIS — Z170 Estrogen receptor positive status [ER+]: Secondary | ICD-10-CM | POA: Diagnosis not present

## 2019-11-29 DIAGNOSIS — C50812 Malignant neoplasm of overlapping sites of left female breast: Secondary | ICD-10-CM | POA: Insufficient documentation

## 2019-11-29 DIAGNOSIS — I10 Essential (primary) hypertension: Secondary | ICD-10-CM | POA: Insufficient documentation

## 2019-11-29 DIAGNOSIS — Z7984 Long term (current) use of oral hypoglycemic drugs: Secondary | ICD-10-CM | POA: Insufficient documentation

## 2019-11-29 DIAGNOSIS — Z79899 Other long term (current) drug therapy: Secondary | ICD-10-CM | POA: Insufficient documentation

## 2019-11-29 NOTE — Assessment & Plan Note (Signed)
Patient noted intermittent left breast pain and swelling for several months. Mammogram showed a 6.2cm mass in the left breast axillary tail with an adjacent 0.9cm mass, 2 abnormal left axillary lymph nodes, 1 abnormal right axillary lymph node, and diffuse left breast edema and skin thickening. Biopsy showed IDC, grade 3 in the left breast and bilateral axillas, HER-2 negative (1+), ER+ 100%, PR- 0%, Ki67 30%.  T4 BN 1 stage IIIc  Pathology and radiology counseling: Discussed with the patient, the details of pathology including the type of breast cancer,the clinical staging, the significance of ER, PR and HER-2/neu receptors and the implications for treatment. After reviewing the pathology in detail, we proceeded to discuss the different treatment options between surgery, radiation, chemotherapy, antiestrogen therapies.  Recommendation based on multidisciplinary tumor board: 1. Neoadjuvant chemotherapy with Adriamycin and Cytoxan dose dense 4 followed by Taxol weekly 12  2. Followed by mastectomy with targeted axillary dissection 3. Followed by adjuvant radiation therapy 4.  Followed by adjuvant antiestrogen therapy  Chemotherapy Counseling: I discussed the risks and benefits of chemotherapy including the risks of nausea/ vomiting, risk of infection from low WBC count, fatigue due to chemo or anemia, bruising or bleeding due to low platelets, mouth sores, loss/ change in taste and decreased appetite. Liver and kidney function will be monitored through out chemotherapy as abnormalities in liver and kidney function may be a side effect of treatment.  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 Risk of permanent bone marrow dysfunction due to chemo were also discussed.  Plan: 1. Port placement to be done next Monday 2. Echocardiogram 3. Chemotherapy class 4. Breast MRI 5. CT chest abdomen pelvis and bone scan for staging    UPBEAT clinical  trial ([id number]): Newly diagnosed stage I to III breast cancer patients receiving either adjuvant or neoadjuvant chemotherapy undergo cardiac MRI before treatment and at 24 months along with neurocognitive testing, exercise and disability measures at baseline 3, 12 and 24 months.   Return to clinic in 1 week to start chemotherapy.

## 2019-11-29 NOTE — Telephone Encounter (Signed)
No 7/1 los, no changes made to schedule

## 2019-11-29 NOTE — Progress Notes (Signed)
I called patient today and had a very lovely conversation with her.  She is a little overwhelmed because she has so much going on.  She was referred to our office by Dr. Lindi Adie from [facility] after meeting with him today, she decided that she wants to get her treatment here near her home at [facility].  I explained to Crystal Vega how I am involved in her care and I provided my contact information to her. I explained that it is okay to be overwhelmed and scared and that is where I come in to place by helping her and guide her through this journey.  I went over her upcoming schedule. I reviewed the changes with her and she wrote them down and verbalized understanding.  I explained that I will meet with her on 7/12 and help her with getting the contrast for her CT scans.  She was given the opportunity to ask questions and to my knowledge all were answered to her satisfaction.

## 2019-12-04 ENCOUNTER — Inpatient Hospital Stay: Payer: Medicare HMO | Admitting: Hematology and Oncology

## 2019-12-04 ENCOUNTER — Encounter (HOSPITAL_COMMUNITY): Payer: Self-pay | Admitting: *Deleted

## 2019-12-04 NOTE — Progress Notes (Signed)
Patient called clinic today with questions about losing her hair. She wanted to know how to prevent it as much as possible.  I explained to her that she could try and not stimulate her hair follicles by patting her hair dry, not vigorously rubbing her head, not blow drying it, not putting any chemicals/colors on it, etc.  I explained that she really can't prevent it from coming out but she can decrease the rate somewhat.    She had other questions about trying to get help with medicaid. She verbalized having some concern about paying for her medical bills.  I explained to her that I will refer her to our social worker and financial counselor for assistance in this area. She verbalized appreciation.

## 2019-12-06 ENCOUNTER — Other Ambulatory Visit (HOSPITAL_COMMUNITY): Payer: Medicare HMO

## 2019-12-07 ENCOUNTER — Other Ambulatory Visit (HOSPITAL_COMMUNITY): Payer: Self-pay | Admitting: *Deleted

## 2019-12-07 DIAGNOSIS — Z170 Estrogen receptor positive status [ER+]: Secondary | ICD-10-CM

## 2019-12-10 ENCOUNTER — Other Ambulatory Visit: Payer: Self-pay

## 2019-12-10 ENCOUNTER — Ambulatory Visit
Admission: RE | Admit: 2019-12-10 | Discharge: 2019-12-10 | Disposition: A | Discharge status: Home / Self Care | Payer: Medicare HMO | Source: Ambulatory Visit | Attending: Surgery | Admitting: Surgery

## 2019-12-10 ENCOUNTER — Inpatient Hospital Stay (HOSPITAL_COMMUNITY): Payer: Medicare HMO | Attending: Hematology

## 2019-12-10 DIAGNOSIS — I10 Essential (primary) hypertension: Secondary | ICD-10-CM | POA: Insufficient documentation

## 2019-12-10 DIAGNOSIS — C7981 Secondary malignant neoplasm of breast: Secondary | ICD-10-CM | POA: Insufficient documentation

## 2019-12-10 DIAGNOSIS — E119 Type 2 diabetes mellitus without complications: Secondary | ICD-10-CM | POA: Insufficient documentation

## 2019-12-10 DIAGNOSIS — C792 Secondary malignant neoplasm of skin: Secondary | ICD-10-CM | POA: Diagnosis not present

## 2019-12-10 DIAGNOSIS — Z7984 Long term (current) use of oral hypoglycemic drugs: Secondary | ICD-10-CM | POA: Diagnosis not present

## 2019-12-10 DIAGNOSIS — C773 Secondary and unspecified malignant neoplasm of axilla and upper limb lymph nodes: Secondary | ICD-10-CM | POA: Insufficient documentation

## 2019-12-10 DIAGNOSIS — Z79899 Other long term (current) drug therapy: Secondary | ICD-10-CM | POA: Insufficient documentation

## 2019-12-10 DIAGNOSIS — C50912 Malignant neoplasm of unspecified site of left female breast: Secondary | ICD-10-CM

## 2019-12-10 DIAGNOSIS — Z87891 Personal history of nicotine dependence: Secondary | ICD-10-CM | POA: Insufficient documentation

## 2019-12-10 DIAGNOSIS — C50812 Malignant neoplasm of overlapping sites of left female breast: Secondary | ICD-10-CM | POA: Diagnosis not present

## 2019-12-10 DIAGNOSIS — C50411 Malignant neoplasm of upper-outer quadrant of right female breast: Secondary | ICD-10-CM | POA: Diagnosis present

## 2019-12-10 DIAGNOSIS — D374 Neoplasm of uncertain behavior of colon: Secondary | ICD-10-CM | POA: Insufficient documentation

## 2019-12-10 LAB — COMPREHENSIVE METABOLIC PANEL
ALT: 18 U/L (ref 0–44)
AST: 31 U/L (ref 15–41)
Albumin: 3.4 g/dL — ABNORMAL LOW (ref 3.5–5.0)
Alkaline Phosphatase: 73 U/L (ref 38–126)
Anion gap: 10 (ref 5–15)
BUN: 16 mg/dL (ref 8–23)
CO2: 25 mmol/L (ref 22–32)
Calcium: 9 mg/dL (ref 8.9–10.3)
Chloride: 101 mmol/L (ref 98–111)
Creatinine, Ser: 0.68 mg/dL (ref 0.44–1.00)
GFR calc Af Amer: >60 mL/min (ref >60)
GFR calc non Af Amer: >60 mL/min (ref >60)
Glucose, Bld: 119 mg/dL — ABNORMAL HIGH (ref 70–99)
Potassium: 4 mmol/L (ref 3.5–5.1)
Sodium: 136 mmol/L (ref 135–145)
Total Bilirubin: 0.5 mg/dL (ref 0.3–1.2)
Total Protein: 7.8 g/dL (ref 6.5–8.1)

## 2019-12-10 MED ORDER — GADOBUTROL 1 MMOL/ML IV SOLN
10.0000 mL | Freq: Once | INTRAVENOUS | Status: AC | PRN
  Administered 2019-12-10: 10 mL via INTRAVENOUS

## 2019-12-10 NOTE — Progress Notes (Signed)
[id number] - [place], [place] [address] [place] [address] Phone: [phone] Fax: [phone]   Your procedure is scheduled on Friday, July 16th.  Report to [facility] Main Entrance "A" at 5:30 A.M., and check in at the Admitting office.  Call this number if you have problems the morning of surgery:  [phone]  Call [phone] if you have any questions prior to your surgery date Monday-Friday 8am-4pm   Remember:  Do not eat or drink after midnight the night before your surgery    Take these medicines the morning of surgery with A SIP OF WATER  oxybutynin (DITROPAN)  pravastatin (PRAVACHOL)   If needed - HYDROcodone-acetaminophen (NORCO/VICODIN)   As of today, STOP taking any Aspirin (unless otherwise instructed by your surgeon) Aleve, Naproxen, Ibuprofen, Motrin, Advil, Goody's, BC's, all herbal medications, fish oil, and all vitamins.  WHAT DO I DO ABOUT MY DIABETES MEDICATION?  --- The morning of surgery --- Do NOT take metFORMIN (GLUCOPHAGE)  HOW TO MANAGE YOUR DIABETES BEFORE AND AFTER SURGERY  Why is it important to control my blood sugar before and after surgery? . Improving blood sugar levels before and after surgery helps healing and can limit problems. . A way of improving blood sugar control is eating a healthy diet by: o  Eating less sugar and carbohydrates o  Increasing activity/exercise o  Talking with your doctor about reaching your blood sugar goals . High blood sugars (greater than 180 mg/dL) can raise your risk of infections and slow your recovery, so you will need to focus on controlling your diabetes during the weeks before surgery. . Make sure that the doctor who takes care of your diabetes knows about your planned surgery including the date and location.  How do I manage my blood sugar before surgery? . Check your blood sugar at least 4 times a day, starting 2 days before surgery, to make sure  that the level is not too high or low. . Check your blood sugar the morning of your surgery when you wake up and every 2 hours until you get to the Short Stay unit. o If your blood sugar is less than 70 mg/dL, you will need to treat for low blood sugar: - Do not take insulin. - Treat a low blood sugar (less than 70 mg/dL) with  cup of clear juice (cranberry or apple), 4 glucose tablets, OR glucose gel. - Recheck blood sugar in 15 minutes after treatment (to make sure it is greater than 70 mg/dL). If your blood sugar is not greater than 70 mg/dL on recheck, call [phone] for further instructions. . Report your blood sugar to the short stay nurse when you get to Short Stay.  . If you are admitted to the hospital after surgery: o Your blood sugar will be checked by the staff and you will probably be given insulin after surgery (instead of oral diabetes medicines) to make sure you have good blood sugar levels. o The goal for blood sugar control after surgery is 80-180 mg/dL.             Do not wear jewelry, make up, or nail polish            Do not wear lotions, powders, perfumes/colognes, or deodorant.            Do not shave 48 hours prior to surgery.  Men may shave face and neck.  Do not bring valuables to the hospital.            [facility] is not responsible for any belongings or valuables.  Do NOT Smoke (Tobacco/Vaping) or drink Alcohol 24 hours prior to your procedure If you use a CPAP at night, you may bring all equipment for your overnight stay.   Contacts, glasses, dentures or bridgework may not be worn into surgery.      For patients admitted to the hospital, discharge time will be determined by your treatment team.   Patients discharged the day of surgery will not be allowed to drive home, and someone needs to stay with them for 24 hours.  Special instructions:   [facility]- Preparing For Surgery  Before surgery, you can play an important role. Because skin is  not sterile, your skin needs to be as free of germs as possible. You can reduce the number of germs on your skin by washing with CHG (chlorahexidine gluconate) Soap before surgery.  CHG is an antiseptic cleaner which kills germs and bonds with the skin to continue killing germs even after washing.    Oral Hygiene is also important to reduce your risk of infection.  Remember - BRUSH YOUR TEETH THE MORNING OF SURGERY WITH YOUR REGULAR TOOTHPASTE  Please do not use if you have an allergy to CHG or antibacterial soaps. If your skin becomes reddened/irritated stop using the CHG.  Do not shave (including legs and underarms) for at least 48 hours prior to first CHG shower. It is OK to shave your face.  Please follow these instructions carefully.   1. Shower the NIGHT BEFORE SURGERY and the MORNING OF SURGERY with CHG Soap.   2. If you chose to wash your hair, wash your hair first as usual with your normal shampoo.  3. After you shampoo, rinse your hair and body thoroughly to remove the shampoo.  4. Use CHG as you would any other liquid soap. You can apply CHG directly to the skin and wash gently with a scrungie or a clean washcloth.   5. Apply the CHG Soap to your body ONLY FROM THE NECK DOWN.  Do not use on open wounds or open sores. Avoid contact with your eyes, ears, mouth and genitals (private parts). Wash Face and genitals (private parts)  with your normal soap.   6. Wash thoroughly, paying special attention to the area where your surgery will be performed.  7. Thoroughly rinse your body with warm water from the neck down.  8. DO NOT shower/wash with your normal soap after using and rinsing off the CHG Soap.  9. Pat yourself dry with a CLEAN TOWEL.  10. Wear CLEAN PAJAMAS to bed the night before surgery  11. Place CLEAN SHEETS on your bed the night of your first shower and DO NOT SLEEP WITH PETS.  Day of Surgery: Wear Clean/Comfortable clothing the morning of surgery Do not apply any  deodorants/lotions.   Remember to brush your teeth WITH YOUR REGULAR TOOTHPASTE.   Please read over the following fact sheets that you were given.

## 2019-12-11 ENCOUNTER — Other Ambulatory Visit: Payer: Self-pay

## 2019-12-11 ENCOUNTER — Encounter (HOSPITAL_COMMUNITY)
Admission: RE | Admit: 2019-12-11 | Discharge: 2019-12-11 | Disposition: A | Discharge status: Home / Self Care | Payer: Medicare HMO | Source: Ambulatory Visit | Attending: Surgery | Admitting: Surgery

## 2019-12-11 ENCOUNTER — Other Ambulatory Visit (HOSPITAL_COMMUNITY)
Admission: RE | Admit: 2019-12-11 | Discharge: 2019-12-11 | Disposition: A | Discharge status: Home / Self Care | Payer: Medicare HMO | Source: Ambulatory Visit | Attending: Surgery | Admitting: Surgery

## 2019-12-11 ENCOUNTER — Ambulatory Visit (HOSPITAL_COMMUNITY)
Admission: RE | Admit: 2019-12-11 | Discharge: 2019-12-11 | Disposition: A | Discharge status: Home / Self Care | Payer: Medicare HMO | Source: Ambulatory Visit | Attending: Hematology and Oncology | Admitting: Hematology and Oncology

## 2019-12-11 ENCOUNTER — Encounter (HOSPITAL_COMMUNITY): Payer: Self-pay

## 2019-12-11 DIAGNOSIS — C50812 Malignant neoplasm of overlapping sites of left female breast: Secondary | ICD-10-CM

## 2019-12-11 DIAGNOSIS — Z01818 Encounter for other preprocedural examination: Secondary | ICD-10-CM | POA: Diagnosis present

## 2019-12-11 DIAGNOSIS — Z20822 Contact with and (suspected) exposure to covid-19: Secondary | ICD-10-CM | POA: Insufficient documentation

## 2019-12-11 DIAGNOSIS — Z170 Estrogen receptor positive status [ER+]: Secondary | ICD-10-CM

## 2019-12-11 HISTORY — DX: Unspecified osteoarthritis, unspecified site: M19.90

## 2019-12-11 HISTORY — DX: Anemia, unspecified: D64.9

## 2019-12-11 HISTORY — DX: Malignant neoplasm of unspecified site of unspecified female breast: C50.919

## 2019-12-11 LAB — CBC
HCT: 38.1 % (ref 36.0–46.0)
Hemoglobin: 12.2 g/dL (ref 12.0–15.0)
MCH: 27.4 pg (ref 26.0–34.0)
MCHC: 32 g/dL (ref 30.0–36.0)
MCV: 85.6 fL (ref 80.0–100.0)
Platelets: 287 10*3/uL (ref 150–400)
RBC: 4.45 MIL/uL (ref 3.87–5.11)
RDW: 13.6 % (ref 11.5–15.5)
WBC: 6.5 10*3/uL (ref 4.0–10.5)
nRBC: 0 % (ref 0.0–0.2)

## 2019-12-11 LAB — SARS CORONAVIRUS 2 (TAT 6-24 HRS): SARS Coronavirus 2: NEGATIVE

## 2019-12-11 LAB — HEMOGLOBIN A1C
Hgb A1c MFr Bld: 6.2 % — ABNORMAL HIGH (ref 4.8–5.6)
Mean Plasma Glucose: 131.24 mg/dL

## 2019-12-11 LAB — GLUCOSE, CAPILLARY: Glucose-Capillary: 89 mg/dL (ref 70–99)

## 2019-12-11 MED ORDER — IOHEXOL 300 MG/ML  SOLN: 100.0000 mL | Freq: Once | INTRAMUSCULAR | Status: DC | PRN

## 2019-12-11 NOTE — Progress Notes (Signed)
PCP - Dr. Lemmie Evens Cardiologist - denies  PPM/ICD - denies  Chest x-ray - N/A EKG - 12/11/2019 Stress Test - denies ECHO - 2015 Cardiac Cath - denies   Sleep Study - denies CPAP - N/A  DM: patient stated, she's a type 2 diabetic and rarely checks blood sugar CBG @ PAT appointment: 89  Blood Thinner Instructions: N/A Aspirin Instructions: N/A  ERAS Protcol - No  COVID TEST- 12/11/2019, test results pending. Patient verbalized understanding of self-quarantine instructions, appointment time and place.  Anesthesia review: No  Patient denies shortness of breath, fever, cough and chest pain at PAT appointment  All instructions explained to the patient, with a verbal understanding of the material. Patient agrees to go over the instructions while at home for a better understanding. Patient also instructed to self quarantine after being tested for COVID-19. The opportunity to ask questions was provided.

## 2019-12-12 ENCOUNTER — Encounter (HOSPITAL_COMMUNITY): Payer: Self-pay

## 2019-12-12 ENCOUNTER — Ambulatory Visit (HOSPITAL_COMMUNITY)
Admission: RE | Admit: 2019-12-12 | Discharge: 2019-12-12 | Disposition: A | Discharge status: Home / Self Care | Payer: Medicare HMO | Source: Ambulatory Visit | Attending: Hematology and Oncology | Admitting: Hematology and Oncology

## 2019-12-12 DIAGNOSIS — Z170 Estrogen receptor positive status [ER+]: Secondary | ICD-10-CM | POA: Diagnosis not present

## 2019-12-12 DIAGNOSIS — C50812 Malignant neoplasm of overlapping sites of left female breast: Secondary | ICD-10-CM | POA: Insufficient documentation

## 2019-12-12 MED ORDER — TECHNETIUM TC 99M MEDRONATE IV KIT
20.0000 | PACK | Freq: Once | INTRAVENOUS | Status: AC | PRN
  Administered 2019-12-12: 21 via INTRAVENOUS

## 2019-12-12 MED ORDER — IOHEXOL 300 MG/ML  SOLN
100.0000 mL | Freq: Once | INTRAMUSCULAR | Status: AC | PRN
  Administered 2019-12-12: 100 mL via INTRAVENOUS

## 2019-12-13 MED ORDER — DEXTROSE 5 % IV SOLN
3.0000 g | INTRAVENOUS | Status: AC
  Administered 2019-12-14: 2 g via INTRAVENOUS
  Filled 2019-12-13: qty 3
  Filled 2019-12-13: qty 3000

## 2019-12-13 NOTE — Anesthesia Preprocedure Evaluation (Addendum)
Anesthesia Evaluation  Patient identified by MRN, date of birth, ID band Patient awake    Reviewed: Allergy & Precautions, NPO status , Patient's Chart, lab work & pertinent test results  Airway Mallampati: II  TM Distance: >3 FB Neck ROM: Full    Dental no notable dental hx. (+) Poor Dentition, Chipped, Missing, Loose, Dental Advisory Given,    Pulmonary neg pulmonary ROS, former smoker,    Pulmonary exam normal breath sounds clear to auscultation       Cardiovascular hypertension, Pt. on medications negative cardio ROS Normal cardiovascular exam Rhythm:Regular Rate:Normal     Neuro/Psych negative neurological ROS  negative psych ROS   GI/Hepatic negative GI ROS, Neg liver ROS,   Endo/Other  negative endocrine ROSdiabetes, Type 2Morbid obesity  Renal/GU negative Renal ROS  negative genitourinary   Musculoskeletal negative musculoskeletal ROS (+) Arthritis , Osteoarthritis,    Abdominal   Peds negative pediatric ROS (+)  Hematology negative hematology ROS (+) Blood dyscrasia, anemia ,   Anesthesia Other Findings   Reproductive/Obstetrics negative OB ROS                           Anesthesia Physical  Anesthesia Plan  ASA: III  Anesthesia Plan: General   Post-op Pain Management:    Induction: Intravenous  PONV Risk Score and Plan:   Airway Management Planned: LMA and Oral ETT  Additional Equipment:   Intra-op Plan:   Post-operative Plan: Extubation in OR  Informed Consent: I have reviewed the patients History and Physical, chart, labs and discussed the procedure including the risks, benefits and alternatives for the proposed anesthesia with the patient or authorized representative who has indicated his/her understanding and acceptance.       Plan Discussed with: Anesthesiologist and CRNA  Anesthesia Plan Comments:        Anesthesia Quick Evaluation

## 2019-12-14 ENCOUNTER — Encounter (HOSPITAL_COMMUNITY): Payer: Self-pay | Admitting: *Deleted

## 2019-12-14 ENCOUNTER — Ambulatory Visit (HOSPITAL_COMMUNITY): Payer: Medicare HMO

## 2019-12-14 ENCOUNTER — Ambulatory Visit (HOSPITAL_COMMUNITY): Payer: Medicare HMO | Admitting: Anesthesiology

## 2019-12-14 ENCOUNTER — Other Ambulatory Visit: Payer: Self-pay

## 2019-12-14 ENCOUNTER — Encounter (HOSPITAL_COMMUNITY)
Admission: RE | Disposition: A | Discharge status: Home / Self Care | Payer: Self-pay | Source: Home / Self Care | Attending: Surgery

## 2019-12-14 ENCOUNTER — Encounter (HOSPITAL_COMMUNITY): Payer: Self-pay | Admitting: Surgery

## 2019-12-14 ENCOUNTER — Ambulatory Visit (HOSPITAL_COMMUNITY)
Admission: RE | Admit: 2019-12-14 | Discharge: 2019-12-14 | Disposition: A | Discharge status: Home / Self Care | Payer: Medicare HMO | Attending: Surgery | Admitting: Surgery

## 2019-12-14 DIAGNOSIS — C773 Secondary and unspecified malignant neoplasm of axilla and upper limb lymph nodes: Secondary | ICD-10-CM | POA: Insufficient documentation

## 2019-12-14 DIAGNOSIS — Z87891 Personal history of nicotine dependence: Secondary | ICD-10-CM | POA: Diagnosis not present

## 2019-12-14 DIAGNOSIS — Z7984 Long term (current) use of oral hypoglycemic drugs: Secondary | ICD-10-CM | POA: Insufficient documentation

## 2019-12-14 DIAGNOSIS — M1990 Unspecified osteoarthritis, unspecified site: Secondary | ICD-10-CM | POA: Insufficient documentation

## 2019-12-14 DIAGNOSIS — Z79899 Other long term (current) drug therapy: Secondary | ICD-10-CM | POA: Diagnosis not present

## 2019-12-14 DIAGNOSIS — I10 Essential (primary) hypertension: Secondary | ICD-10-CM | POA: Diagnosis not present

## 2019-12-14 DIAGNOSIS — C50912 Malignant neoplasm of unspecified site of left female breast: Secondary | ICD-10-CM | POA: Diagnosis not present

## 2019-12-14 DIAGNOSIS — Z170 Estrogen receptor positive status [ER+]: Secondary | ICD-10-CM | POA: Insufficient documentation

## 2019-12-14 DIAGNOSIS — E119 Type 2 diabetes mellitus without complications: Secondary | ICD-10-CM | POA: Diagnosis not present

## 2019-12-14 DIAGNOSIS — Z452 Encounter for adjustment and management of vascular access device: Secondary | ICD-10-CM

## 2019-12-14 DIAGNOSIS — Z6841 Body Mass Index (BMI) 40.0 and over, adult: Secondary | ICD-10-CM | POA: Insufficient documentation

## 2019-12-14 DIAGNOSIS — E7800 Pure hypercholesterolemia, unspecified: Secondary | ICD-10-CM | POA: Diagnosis not present

## 2019-12-14 DIAGNOSIS — Z419 Encounter for procedure for purposes other than remedying health state, unspecified: Secondary | ICD-10-CM

## 2019-12-14 HISTORY — PX: PORTACATH PLACEMENT: SHX2246

## 2019-12-14 LAB — GLUCOSE, CAPILLARY
Glucose-Capillary: 106 mg/dL — ABNORMAL HIGH (ref 70–99)
Glucose-Capillary: 129 mg/dL — ABNORMAL HIGH (ref 70–99)

## 2019-12-14 SURGERY — INSERTION, TUNNELED CENTRAL VENOUS DEVICE, WITH PORT
Anesthesia: General | Site: Breast

## 2019-12-14 MED ORDER — HYDROCODONE-ACETAMINOPHEN 5-325 MG PO TABS: 1.0000 | ORAL_TABLET | Freq: Four times a day (QID) | ORAL | 0 refills | Status: DC | PRN

## 2019-12-14 MED ORDER — ACETAMINOPHEN 325 MG PO TABS: 325.0000 mg | ORAL_TABLET | ORAL | Status: DC | PRN

## 2019-12-14 MED ORDER — STERILE WATER FOR IRRIGATION IR SOLN
Status: DC | PRN
  Administered 2019-12-14: 1000 mL

## 2019-12-14 MED ORDER — FENTANYL CITRATE (PF) 100 MCG/2ML IJ SOLN: 25.0000 ug | INTRAMUSCULAR | Status: DC | PRN

## 2019-12-14 MED ORDER — LIDOCAINE 2% (20 MG/ML) 5 ML SYRINGE
INTRAMUSCULAR | Status: AC
  Filled 2019-12-14: qty 5

## 2019-12-14 MED ORDER — HEPARIN SOD (PORK) LOCK FLUSH 100 UNIT/ML IV SOLN
INTRAVENOUS | Status: DC | PRN
  Administered 2019-12-14: 500 [IU] via INTRAVENOUS

## 2019-12-14 MED ORDER — ONDANSETRON HCL 4 MG/2ML IJ SOLN
INTRAMUSCULAR | Status: AC
  Filled 2019-12-14: qty 2

## 2019-12-14 MED ORDER — LIDOCAINE 2% (20 MG/ML) 5 ML SYRINGE
INTRAMUSCULAR | Status: DC | PRN
  Administered 2019-12-14: 100 mg via INTRAVENOUS

## 2019-12-14 MED ORDER — SUCCINYLCHOLINE CHLORIDE 200 MG/10ML IV SOSY
PREFILLED_SYRINGE | INTRAVENOUS | Status: DC | PRN
  Administered 2019-12-14: 120 mg via INTRAVENOUS

## 2019-12-14 MED ORDER — CHLORHEXIDINE GLUCONATE CLOTH 2 % EX PADS: 6.0000 | MEDICATED_PAD | Freq: Once | CUTANEOUS | Status: DC

## 2019-12-14 MED ORDER — ONDANSETRON HCL 4 MG/2ML IJ SOLN: 4.0000 mg | Freq: Once | INTRAMUSCULAR | Status: DC | PRN

## 2019-12-14 MED ORDER — LACTATED RINGERS IV SOLN: INTRAVENOUS | Status: DC | PRN

## 2019-12-14 MED ORDER — FENTANYL CITRATE (PF) 100 MCG/2ML IJ SOLN
INTRAMUSCULAR | Status: DC | PRN
  Administered 2019-12-14 (×2): 50 ug via INTRAVENOUS

## 2019-12-14 MED ORDER — HEPARIN SOD (PORK) LOCK FLUSH 100 UNIT/ML IV SOLN
INTRAVENOUS | Status: AC
  Filled 2019-12-14: qty 5

## 2019-12-14 MED ORDER — DEXAMETHASONE SODIUM PHOSPHATE 10 MG/ML IJ SOLN
INTRAMUSCULAR | Status: DC | PRN
  Administered 2019-12-14: 6 mg via INTRAVENOUS

## 2019-12-14 MED ORDER — MEPERIDINE HCL 25 MG/ML IJ SOLN: 6.2500 mg | INTRAMUSCULAR | Status: DC | PRN

## 2019-12-14 MED ORDER — FENTANYL CITRATE (PF) 250 MCG/5ML IJ SOLN
INTRAMUSCULAR | Status: AC
  Filled 2019-12-14: qty 5

## 2019-12-14 MED ORDER — BUPIVACAINE HCL (PF) 0.25 % IJ SOLN
INTRAMUSCULAR | Status: AC
  Filled 2019-12-14: qty 30

## 2019-12-14 MED ORDER — 0.9 % SODIUM CHLORIDE (POUR BTL) OPTIME
TOPICAL | Status: DC | PRN
  Administered 2019-12-14: 1000 mL

## 2019-12-14 MED ORDER — PHENYLEPHRINE 40 MCG/ML (10ML) SYRINGE FOR IV PUSH (FOR BLOOD PRESSURE SUPPORT)
PREFILLED_SYRINGE | INTRAVENOUS | Status: AC
  Filled 2019-12-14: qty 10

## 2019-12-14 MED ORDER — PHENYLEPHRINE HCL-NACL 10-0.9 MG/250ML-% IV SOLN
INTRAVENOUS | Status: DC | PRN
  Administered 2019-12-14: 60 ug/min via INTRAVENOUS

## 2019-12-14 MED ORDER — PHENYLEPHRINE 40 MCG/ML (10ML) SYRINGE FOR IV PUSH (FOR BLOOD PRESSURE SUPPORT)
PREFILLED_SYRINGE | INTRAVENOUS | Status: DC | PRN
  Administered 2019-12-14 (×2): 80 ug via INTRAVENOUS
  Administered 2019-12-14: 120 ug via INTRAVENOUS

## 2019-12-14 MED ORDER — LACTATED RINGERS IV SOLN: INTRAVENOUS | Status: DC

## 2019-12-14 MED ORDER — PROPOFOL 10 MG/ML IV BOLUS
INTRAVENOUS | Status: AC
  Filled 2019-12-14: qty 20

## 2019-12-14 MED ORDER — OXYCODONE HCL 5 MG/5ML PO SOLN: 5.0000 mg | Freq: Once | ORAL | Status: DC | PRN

## 2019-12-14 MED ORDER — MIDAZOLAM HCL 5 MG/5ML IJ SOLN
INTRAMUSCULAR | Status: DC | PRN
  Administered 2019-12-14 (×2): 1 mg via INTRAVENOUS

## 2019-12-14 MED ORDER — OXYCODONE HCL 5 MG PO TABS: 5.0000 mg | ORAL_TABLET | Freq: Once | ORAL | Status: DC | PRN

## 2019-12-14 MED ORDER — MIDAZOLAM HCL 2 MG/2ML IJ SOLN
INTRAMUSCULAR | Status: AC
  Filled 2019-12-14: qty 2

## 2019-12-14 MED ORDER — CHLORHEXIDINE GLUCONATE 0.12 % MT SOLN
15.0000 mL | Freq: Once | OROMUCOSAL | Status: AC
  Administered 2019-12-14: 15 mL via OROMUCOSAL
  Filled 2019-12-14: qty 15

## 2019-12-14 MED ORDER — BUPIVACAINE HCL 0.25 % IJ SOLN
INTRAMUSCULAR | Status: DC | PRN
  Administered 2019-12-14: 10 mL

## 2019-12-14 MED ORDER — DEXAMETHASONE SODIUM PHOSPHATE 10 MG/ML IJ SOLN
INTRAMUSCULAR | Status: AC
  Filled 2019-12-14: qty 1

## 2019-12-14 MED ORDER — KETOROLAC TROMETHAMINE 30 MG/ML IJ SOLN
INTRAMUSCULAR | Status: AC
  Filled 2019-12-14: qty 1

## 2019-12-14 MED ORDER — SODIUM CHLORIDE 0.9 % IV SOLN
INTRAVENOUS | Status: DC | PRN
  Administered 2019-12-14: 500 mL

## 2019-12-14 MED ORDER — PROPOFOL 10 MG/ML IV BOLUS
INTRAVENOUS | Status: DC | PRN
  Administered 2019-12-14 (×2): 50 mg via INTRAVENOUS
  Administered 2019-12-14: 150 mg via INTRAVENOUS

## 2019-12-14 MED ORDER — SODIUM CHLORIDE 0.9 % IV SOLN
INTRAVENOUS | Status: AC
  Filled 2019-12-14: qty 1.2

## 2019-12-14 MED ORDER — ACETAMINOPHEN 160 MG/5ML PO SOLN: 325.0000 mg | ORAL | Status: DC | PRN

## 2019-12-14 MED ORDER — SUCCINYLCHOLINE CHLORIDE 200 MG/10ML IV SOSY
PREFILLED_SYRINGE | INTRAVENOUS | Status: AC
  Filled 2019-12-14: qty 10

## 2019-12-14 MED ORDER — ONDANSETRON HCL 4 MG/2ML IJ SOLN
INTRAMUSCULAR | Status: DC | PRN
  Administered 2019-12-14: 4 mg via INTRAVENOUS

## 2019-12-14 MED ORDER — ORAL CARE MOUTH RINSE: 15.0000 mL | Freq: Once | OROMUCOSAL | Status: AC

## 2019-12-14 SURGICAL SUPPLY — 46 items
APL SKNCLS STERI-STRIP NONHPOA (GAUZE/BANDAGES/DRESSINGS) ×1
BAG DECANTER FOR FLEXI CONT (MISCELLANEOUS) ×3 IMPLANT
BENZOIN TINCTURE PRP APPL 2/3 (GAUZE/BANDAGES/DRESSINGS) ×3 IMPLANT
CHLORAPREP W/TINT 10.5 ML (MISCELLANEOUS) ×3 IMPLANT
CLOSURE WOUND 1/2 X4 (GAUZE/BANDAGES/DRESSINGS) ×1
COVER SURGICAL LIGHT HANDLE (MISCELLANEOUS) ×3 IMPLANT
COVER TRANSDUCER ULTRASND GEL (DISPOSABLE) ×2 IMPLANT
COVER WAND RF STERILE (DRAPES) ×1 IMPLANT
DRAPE C-ARM 42X120 X-RAY (DRAPES) ×3 IMPLANT
DRAPE LAPAROSCOPIC ABDOMINAL (DRAPES) ×3 IMPLANT
DRSG TEGADERM 4X4.75 (GAUZE/BANDAGES/DRESSINGS) ×3 IMPLANT
ELECT CAUTERY BLADE 6.4 (BLADE) ×3 IMPLANT
ELECT REM PT RETURN 9FT ADLT (ELECTROSURGICAL) ×3
ELECTRODE REM PT RTRN 9FT ADLT (ELECTROSURGICAL) ×1 IMPLANT
GAUZE 4X4 16PLY RFD (DISPOSABLE) ×3 IMPLANT
GAUZE SPONGE 2X2 8PLY STRL LF (GAUZE/BANDAGES/DRESSINGS) ×1 IMPLANT
GEL ULTRASOUND 20GR AQUASONIC (MISCELLANEOUS) IMPLANT
GLOVE BIO SURGEON STRL SZ7 (GLOVE) ×3 IMPLANT
GLOVE BIOGEL PI IND STRL 7.5 (GLOVE) ×1 IMPLANT
GLOVE BIOGEL PI INDICATOR 7.5 (GLOVE) ×2
GOWN STRL REUS W/ TWL LRG LVL3 (GOWN DISPOSABLE) ×2 IMPLANT
GOWN STRL REUS W/TWL LRG LVL3 (GOWN DISPOSABLE) ×6
INTRODUCER COOK 11FR (CATHETERS) IMPLANT
KIT BASIN OR (CUSTOM PROCEDURE TRAY) ×3 IMPLANT
KIT PORT POWER 8FR ISP CVUE (Port) ×2 IMPLANT
KIT TURNOVER KIT B (KITS) ×3 IMPLANT
NS IRRIG 1000ML POUR BTL (IV SOLUTION) ×3 IMPLANT
PAD ARMBOARD 7.5X6 YLW CONV (MISCELLANEOUS) ×3 IMPLANT
PENCIL BUTTON HOLSTER BLD 10FT (ELECTRODE) ×3 IMPLANT
PENCIL SMOKE EVAC W/HOLSTER (ELECTROSURGICAL) ×2 IMPLANT
POSITIONER HEAD DONUT 9IN (MISCELLANEOUS) ×3 IMPLANT
SET INTRODUCER 12FR PACEMAKER (INTRODUCER) IMPLANT
SET SHEATH INTRODUCER 10FR (MISCELLANEOUS) IMPLANT
SHEATH COOK PEEL AWAY SET 9F (SHEATH) IMPLANT
SPONGE GAUZE 2X2 8PLY STER LF (GAUZE/BANDAGES/DRESSINGS) ×1
SPONGE GAUZE 2X2 8PLY STRL LF (GAUZE/BANDAGES/DRESSINGS) ×1 IMPLANT
SPONGE GAUZE 2X2 STER 10/PKG (GAUZE/BANDAGES/DRESSINGS) ×2
STRIP CLOSURE SKIN 1/2X4 (GAUZE/BANDAGES/DRESSINGS) ×2 IMPLANT
SUT MNCRL AB 4-0 PS2 18 (SUTURE) ×3 IMPLANT
SUT PROLENE 2 0 SH DA (SUTURE) ×3 IMPLANT
SUT VIC AB 3-0 SH 27 (SUTURE) ×3
SUT VIC AB 3-0 SH 27X BRD (SUTURE) ×1 IMPLANT
SYR 5ML LUER SLIP (SYRINGE) ×3 IMPLANT
TOWEL GREEN STERILE (TOWEL DISPOSABLE) ×3 IMPLANT
TOWEL GREEN STERILE FF (TOWEL DISPOSABLE) ×3 IMPLANT
TRAY LAPAROSCOPIC MC (CUSTOM PROCEDURE TRAY) ×3 IMPLANT

## 2019-12-14 NOTE — Op Note (Signed)
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  This was biopsied and showed invasive ductal carcinoma.  She has at least 2+ lymph nodes in the left axilla.  She also has a positive right lymph node.  She presents now for port placement in preparation for neoadjuvant chemotherapy.  Description of procedure: The patient is brought to the operating room and placed in the supine position on the operating room table with a roll behind her shoulders.  After adequate level of general anesthesia was obtained, she was placed in Trendelenburg with her head tilted to the left.  Her right chest and neck were prepped with ChloraPrep and draped in sterile fashion.  A timeout was taken to ensure the proper patient and proper procedure.  We interrogated the right neck with the ultrasound probe.  The carotid artery and the right internal jugular vein were identified.  The introducer needle was inserted into the jugular vein under ultrasound guidance.  There was good blood return.  This was nonpulsatile.  The wire was passed.  Fluoroscopy confirmed that the wire passed down the right side of the mediastinum.  The needle was removed.  We created a subcutaneous pocket just below the clavicle on the right.  The 8 [place] port was assembled.  We attempted to tunnel this from the subcutaneous pocket over the clavicle up to the neck.  However we apparently caused some bleeding and a small superficial vein of the neck.  We held pressure for several minutes until there is no further sign of hematoma.  We then tunneled from the neck down towards the  subcutaneous pocket.  We were able to pass an instrument and then thread the catheter from the pocket up to the neck.  The port was placed in the subcutaneous pocket and was secured with 2-0 Prolene sutures.  There was no further sign of bleeding up in the neck.  We then advanced the dilator and breakaway sheath over the wire using fluoroscopic guidance.  The catheter had been cut to appropriate length using fluoroscopic guidance.  The catheter was advanced through the breakaway sheath after the dilator and wire were removed.  The breakaway sheath was removed.  There were no kinks along the course of the catheter.  We were able to aspirate blood easily and were able to flush the catheter easily.  We instilled concentrated heparin solution.  The incisions were closed with 3-0 Vicryl 4-0 Monocryl.  Benzoin and Steri-Strips were applied.  Again there is no sign of hematoma in the neck.  Sterile dressings were applied.  The patient was then extubated and brought to the recovery room in stable condition.  All counts are correct.  Chest x-ray is pending.  Crystal Vega. Crystal Dover, MD, [facility]  General/ Trauma Surgery   12/14/2019 8:36 AM

## 2019-12-14 NOTE — Interval H&P Note (Signed)
History and Physical Interval Note:  12/14/2019 7:15 AM  Crystal Vega  has presented today for surgery, with the diagnosis of METASTATIC BRAST CANCER.  The various methods of treatment have been discussed with the patient and family. After consideration of risks, benefits and other options for treatment, the patient has consented to  Procedure(s): INSERTION PORT-A-CATH WITH ULTRASOUND GUIDANCE (N/A) as a surgical intervention.  The patient's history has been reviewed, patient examined, no change in status, stable for surgery.  I have reviewed the patient's chart and labs.  Questions were answered to the patient's satisfaction.     Maia Petties

## 2019-12-14 NOTE — Anesthesia Postprocedure Evaluation (Signed)
Anesthesia Post Note  Patient: Crystal Vega  Procedure(s) Performed: INSERTION PORT-A-CATH WITH ULTRASOUND GUIDANCE (N/A Breast)     Patient location during evaluation: PACU Anesthesia Type: General Level of consciousness: awake and alert Pain management: pain level controlled Vital Signs Assessment: post-procedure vital signs reviewed and stable Respiratory status: spontaneous breathing, nonlabored ventilation, respiratory function stable and patient connected to nasal cannula oxygen Cardiovascular status: blood pressure returned to baseline and stable Postop Assessment: no apparent nausea or vomiting Anesthetic complications: no   No complications documented.  Last Vitals:  Vitals:   12/14/19 0843 12/14/19 0858  BP: 131/87 138/85  Pulse: 91 88  Resp: (!) 26 15  Temp: 36.6 C   SpO2: 99% 95%    Last Pain:  Vitals:   12/14/19 0843  TempSrc:   PainSc: 0-No pain                 Tnia Anglada

## 2019-12-14 NOTE — Anesthesia Procedure Notes (Signed)
Procedure Name: Intubation Date/Time: 12/14/2019 7:36 AM Performed by: Orlie Dakin, CRNA Pre-anesthesia Checklist: Patient identified, Emergency Drugs available, Suction available and Patient being monitored Patient Re-evaluated:Patient Re-evaluated prior to induction Oxygen Delivery Method: Circle system utilized Preoxygenation: Pre-oxygenation with 100% oxygen Induction Type: IV induction Laryngoscope Size: Mac and 4 Grade View: Grade II Tube type: Oral Tube size: 7.5 mm Number of attempts: 1 Airway Equipment and Method: Stylet Placement Confirmation: ETT inserted through vocal cords under direct vision,  positive ETCO2 and breath sounds checked- equal and bilateral Secured at: 23 cm Tube secured with: Tape Dental Injury: Teeth and Oropharynx as per pre-operative assessment  Comments: Very poor dentition noted in Short-Stay, loose teeth top and bottom, as noted.

## 2019-12-14 NOTE — Transfer of Care (Signed)
Immediate Anesthesia Transfer of Care Note  Patient: Crystal Vega  Procedure(s) Performed: INSERTION PORT-A-CATH WITH ULTRASOUND GUIDANCE (N/A Breast)  Patient Location: PACU  Anesthesia Type:General  Level of Consciousness: awake and patient cooperative  Airway & Oxygen Therapy: Patient Spontanous Breathing and Patient connected to face mask oxygen  Post-op Assessment: Report given to RN and Post -op Vital signs reviewed and stable  Post vital signs: Reviewed and stable  Last Vitals:  Vitals Value Taken Time  BP 131/87 12/14/19 0843  Temp    Pulse 91 12/14/19 0844  Resp 12 12/14/19 0844  SpO2 99 % 12/14/19 0844  Vitals shown include unvalidated device data.  Last Pain:  Vitals:   12/14/19 0626  TempSrc: Oral  PainSc:       Patients Stated Pain Goal:  (pt. doesn't know) (47/15/95 [id number])  Complications: No complications documented.

## 2019-12-14 NOTE — Discharge Instructions (Signed)
    PORT-A-CATH: POST OP INSTRUCTIONS  Always review your discharge instruction sheet given to you by the facility where your surgery was performed.   1. A prescription for pain medication may be given to you upon discharge. Take your pain medication as prescribed, if needed. If narcotic pain medicine is not needed, then you make take acetaminophen (Tylenol) or ibuprofen (Advil) as needed.  2. Take your usually prescribed medications unless otherwise directed. 3. If you need a refill on your pain medication, please contact our office. All narcotic pain medicine now requires a paper prescription.  Phoned in and fax refills are no longer allowed by law.  Prescriptions will not be filled after 5 pm or on weekends.  4. You should follow a light diet for the remainder of the day after your procedure. 5. Most patients will experience some mild swelling and/or bruising in the area of the incision. It may take several days to resolve. 6. It is common to experience some constipation if taking pain medication after surgery. Increasing fluid intake and taking a stool softener (such as Colace) will usually help or prevent this problem from occurring. A mild laxative (Milk of Magnesia or Miralax) should be taken according to package directions if there are no bowel movements after 48 hours.  7. Unless discharge instructions indicate otherwise, you may remove your bandages 72 hours after surgery, and you may shower at that time. You may have steri-strips (small white skin tapes) in place directly over the incision.  These strips should be left on the skin for 7-10 days.  8. ACTIVITIES:  Limit activity involving your arms for the next 72 hours. Do no strenuous exercise or activity for 1 week. You may drive when you are no longer taking prescription pain medication, you can comfortably wear a seatbelt, and you can maneuver your car. 10.You may need to see your doctor in the office for a follow-up appointment.   Please       check with your doctor.  11.When you receive a new Port-a-Cath, you will get a product guide and        ID card.  Please keep them in case you need them.  WHEN TO CALL YOUR DOCTOR [phone]): 1. Fever over 101.0 2. Chills 3. Continued bleeding from incision 4. Increased redness and tenderness at the site 5. Shortness of breath, difficulty breathing   The clinic staff is available to answer your questions during regular business hours. Please don't hesitate to call and ask to speak to one of the nurses or medical assistants for clinical concerns. If you have a medical emergency, go to the nearest emergency room or call 911.  A surgeon from [facility] is always on call at the hospital.     For further information, please visit [email or web address]

## 2019-12-17 ENCOUNTER — Encounter (HOSPITAL_COMMUNITY): Payer: Self-pay | Admitting: Surgery

## 2019-12-17 ENCOUNTER — Inpatient Hospital Stay (HOSPITAL_BASED_OUTPATIENT_CLINIC_OR_DEPARTMENT_OTHER): Payer: Medicare HMO | Admitting: Hematology

## 2019-12-17 ENCOUNTER — Other Ambulatory Visit: Payer: Self-pay

## 2019-12-17 VITALS — BP 122/59 | HR 110 | Temp 97.8°F | Resp 20 | Ht 63.0 in | Wt 293.3 lb

## 2019-12-17 DIAGNOSIS — Z7189 Other specified counseling: Secondary | ICD-10-CM | POA: Diagnosis not present

## 2019-12-17 DIAGNOSIS — C50812 Malignant neoplasm of overlapping sites of left female breast: Secondary | ICD-10-CM | POA: Diagnosis not present

## 2019-12-17 DIAGNOSIS — Z170 Estrogen receptor positive status [ER+]: Secondary | ICD-10-CM

## 2019-12-17 NOTE — Progress Notes (Signed)
[facility] NOTE  Patient Care Team: Lemmie Evens, MD as PCP - General (Family Medicine) Gala Romney Cristopher Estimable, MD as Consulting Physician (Gastroenterology) Donetta Potts, RN as Oncology Nurse Navigator (Oncology) Dishmon, Garwin Brothers, RN as Oncology Nurse Navigator (Oncology)  CHIEF COMPLAINTS/PURPOSE OF CONSULTATION:  Newly diagnosed left breast cancer and right colon cancer.  HISTORY OF PRESENTING ILLNESS:  Crystal Vega 69 y.o. female is seen in consultation today for further management of newly diagnosed left breast cancer with metastasis to the mediastinal lymph nodes and right axillary lymph nodes.  She reported feeling a lump in her left breast about 9 months ago which has gradually gotten worse and caused intermittent left breast pain.  Mammogram on 11/08/2019 showed 6.2 cm mass in the left breast axillary tail with adjacent 0.9 cm mass, 2 abnormal left axillary lymph nodes and 1 abnormal right axillary lymph node, diffuse left breast edema and skin thickening.  Biopsy on 11/16/2019 showed invasive ductal carcinoma, grade 3, ER 100%, PR 0%, HER-2 negative, Ki-67 30%.  She was evaluated by Dr. Lindi Adie and bone scan and CT scan chest, abdomen and pelvis was ordered.  She did not report any significant weight loss in the past few months.  She reported intermittent throbbing left arm pain for the last couple of months.  Denies any nausea vomiting or vision changes.  She lives at home with her husband.  She retired from working at [employer] for 21 years and worked at [employer] for 25 years prior to that.  She quit smoking about 40 years ago.  Family history significant for mother with stomach cancer.  She denies any bleeding per rectum or melena.  She does report some dark stools but has been taking iron tablet daily.  She reports that she never had a colonoscopy.  I reviewed her records extensively and collaborated the history with the patient.  SUMMARY OF  ONCOLOGIC HISTORY: Oncology History  Malignant neoplasm of overlapping sites of left breast in female, estrogen receptor positive ([place])  11/16/2019 Cancer Staging   Staging form: Breast, AJCC 8th Edition - Clinical stage from 11/16/2019: Stage IV (cT4b, cN1, cM1, G3, ER+, PR-, HER2-) - Signed by [name], MD on 12/17/2019   11/28/2019 Initial Diagnosis   Patient noted intermittent left breast pain and swelling for several months. Mammogram showed a 6.2cm mass in the left breast axillary tail with an adjacent 0.9cm mass, 2 abnormal left axillary lymph nodes, 1 abnormal right axillary lymph node, and diffuse left breast edema and skin thickening. Biopsy showed IDC, grade 3 in the left breast and bilateral axillas, HER-2 negative (1+), ER+ 100%, PR- 0%, Ki67 30%.    12/24/2019 -  Chemotherapy   The patient had ondansetron (ZOFRAN) injection 8 mg, 8 mg (original dose ), Intravenous,  Once, 0 of 4 cycles Dose modification: 8 mg (Cycle 1) PACLitaxel (TAXOL) 192 mg in sodium chloride 0.9 % 250 mL chemo infusion (</= 68m/m2), 80 mg/m2, Intravenous,  Once, 0 of 4 cycles  for chemotherapy treatment.    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  11/16/2019 Cancer Staging   Staging form: Breast, AJCC 8th Edition - Clinical stage from 11/16/2019: Stage IIB (cT0, cN1, cM0, G3, ER+, PR-, HER2-) - Signed by CGardenia Phlegm NP on 11/28/2019   11/28/2019 Initial Diagnosis   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 ([facility]      MEDICAL HISTORY:  Past Medical History:  Diagnosis Date  .  Anemia   . Arthritis    per patient " left knee"  . Essential hypertension, benign   . Metastatic breast cancer ([place])    left breast  . Type 2 diabetes mellitus ([place])     SURGICAL HISTORY: Past Surgical History:  Procedure Laterality Date  . CATARACT EXTRACTION W/ INTRAOCULAR LENS IMPLANT Right   . HYSTEROSCOPY WITH D & C N/A 06/05/2014   Procedure: DILATATION AND CURETTAGE  /HYSTEROSCOPY;  Surgeon: Florian Buff, MD;  Location: [facility];  Service: Gynecology;  Laterality: N/A;  . POLYPECTOMY N/A 06/05/2014   Procedure: ENDOMETRIAL POLYPECTOMY;  Surgeon: Florian Buff, MD;  Location: [facility];  Service: Gynecology;  Laterality: N/A;  . PORTACATH PLACEMENT N/A 12/14/2019   Procedure: INSERTION PORT-A-CATH WITH ULTRASOUND GUIDANCE;  Surgeon: Donnie Mesa, MD;  Location: [facility];  Service: General;  Laterality: N/A;  . TUBAL LIGATION      SOCIAL HISTORY: Social History   Socioeconomic History  . Marital status: Married    Spouse name: Herbie Baltimore  . Number of children: 2  . Years of education: Not on file  . Highest education level: Not on file  Occupational History  . Occupation: retired  Tobacco Use  . Smoking status: Former Smoker    Types: Cigarettes  . Smokeless tobacco: Never Used  . Tobacco comment: quit about 40+ years ago  Vaping Use  . Vaping Use: Never used  Substance and Sexual Activity  . Alcohol use: Never  . Drug use: No  . Sexual activity: Not on file  Other Topics Concern  . Not on file  Social History Narrative  . Not on file   Social Determinants of Health   Financial Resource Strain: Low Risk   . Difficulty of Paying Living Expenses: Not hard at all  Food Insecurity: No Food Insecurity  . Worried About Charity fundraiser in the Last Year: Never true  . Ran Out of Food in the Last Year: Never true  Transportation Needs: No Transportation Needs  . Lack of Transportation (Medical): No  . Lack of Transportation (Non-Medical): No  Physical Activity: Inactive  . Days of Exercise per Week: 0 days  . Minutes of Exercise per Session: 0 min  Stress: No Stress Concern Present  . Feeling of Stress : Only a little  Social Connections: Moderately Integrated  . Frequency of Communication with Friends and Family: Three times a week  . Frequency of Social Gatherings with Friends and Family: Three times a week  . Attends Religious Services: 1 to  4 times per year  . Active Member of Clubs or Organizations: No  . Attends Archivist Meetings: Never  . Marital Status: Married  Human resources officer Violence: Not At Risk  . Fear of Current or Ex-Partner: No  . Emotionally Abused: No  . Physically Abused: No  . Sexually Abused: No    FAMILY HISTORY: Family History  Problem Relation Age of Onset  . Diabetes Mellitus II Father   . Colon cancer Mother        Deceased age 104    ALLERGIES:  has No Known Allergies.  MEDICATIONS:  Current Outpatient Medications  Medication Sig Dispense Refill  . ferrous sulfate 325 (65 FE) MG tablet Take 325 mg by mouth daily with breakfast.    . hydrochlorothiazide (HYDRODIURIL) 25 MG tablet Take 25 mg by mouth daily.    Marland Kitchen HYDROcodone-acetaminophen (NORCO/VICODIN) 5-325 MG tablet Take 1 tablet by mouth every 6 (six) hours as needed  for moderate pain. (Patient not taking: Reported on 12/14/2019) 15 tablet 0  . ibuprofen (ADVIL,MOTRIN) 800 MG tablet Take 800 mg by mouth every 8 (eight) hours as needed (for leg pain).  (Patient not taking: Reported on 12/14/2019)    . losartan (COZAAR) 50 MG tablet Take 50 mg by mouth daily.    . metFORMIN (GLUCOPHAGE) 500 MG tablet Take 500 mg by mouth 2 (two) times daily.     Marland Kitchen oxybutynin (DITROPAN) 5 MG tablet Take 5 mg by mouth daily.     . potassium chloride (MICRO-K) 10 MEQ CR capsule Take 10 mEq by mouth daily.     . pravastatin (PRAVACHOL) 10 MG tablet Take 10 mg by mouth daily.     No current facility-administered medications for this visit.    REVIEW OF SYSTEMS:   Constitutional: Denies fevers, chills or abnormal night sweats Eyes: Denies blurriness of vision, double vision or watery eyes Ears, nose, mouth, throat, and face: Denies mucositis or sore throat Respiratory: Denies cough, dyspnea or wheezes Cardiovascular: Denies palpitation, chest discomfort or lower extremity swelling Gastrointestinal:  Denies nausea, heartburn or change in bowel  habits Skin: Denies abnormal skin rashes Lymphatics: Denies new lymphadenopathy or easy bruising Neurological:Denies numbness, tingling or new weaknesses Behavioral/Psych: Mood is stable, no new changes  Breast: Occasional pain in the left breast noted. All other systems were reviewed with the patient and are negative.  PHYSICAL EXAMINATION: ECOG PERFORMANCE STATUS: 1 - Symptomatic but completely ambulatory  Vitals:   12/17/19 0811  BP: (!) 122/59  Pulse: (!) 110  Resp: 20  Temp: 97.8 F (36.6 C)  SpO2: 97%   Filed Weights   12/17/19 0811  Weight: 293 lb 4.8 oz (133 kg)    GENERAL:alert, no distress and comfortable SKIN: skin color, texture, turgor are normal, no rashes or significant lesions EYES: normal, conjunctiva are pink and non-injected, sclera clear OROPHARYNX:no exudate, no erythema and lips, buccal mucosa, and tongue normal  NECK: supple, thyroid normal size, non-tender, without nodularity LYMPH: Left supraclavicular adenopathy palpable.  Left axillary lymphadenopathy palpable. LUNGS: clear to auscultation and percussion with normal breathing effort HEART: regular rate & rhythm and no murmurs and no lower extremity edema ABDOMEN:abdomen soft, non-tender and normal bowel sounds Musculoskeletal:no cyanosis of digits and no clubbing  PSYCH: alert & oriented x 3 with fluent speech NEURO: no focal motor/sensory deficits BREAST: Enlarged left breast with skin thickening and lymphedema with orange peel appearance in the left breast outer quadrant with erythema.  LABORATORY DATA:  I have reviewed the data as listed Lab Results  Component Value Date   WBC 6.5 12/11/2019   HGB 12.2 12/11/2019   HCT 38.1 12/11/2019   MCV 85.6 12/11/2019   PLT 287 12/11/2019   Lab Results  Component Value Date   NA 136 12/10/2019   K 4.0 12/10/2019   CL 101 12/10/2019   CO2 25 12/10/2019    RADIOGRAPHIC STUDIES: I have personally reviewed the radiological reports and agreed  with the findings in the report.  ASSESSMENT:  1.  Metastatic left breast cancer: -Biopsy on 11/16/2019 shows infiltrative ductal carcinoma, grade 3, ER 100%, PR 0%, HER-2 1+, Ki-67 30%.  Right breast upper outer quadrant biopsy was also positive for malignancy. -MRI of the breast on 12/10/2019 shows left breast malignancy involving all 4 quadrants and skin of the left breast including 0.5 cm biopsy-proven malignancy within the far posterior outer right breast, abnormal highly suspicious nonmuscle like enhancement within all 4 quadrants of  the left breast and diffuse skin thickening.  This abnormal non-mass-like enhancement and skin thickening extends across the midline to the  medial right breast.  At least 7 abnormal left axillary lymph nodes and 1 abnormal right axillary lymph node compatible with metastatic disease. -CT CAP on 12/12/2019 shows bulky lymphadenopathy in the left subpectoral and axillary regions with largest lymph node measuring 3.3 cm.  Mild mediastinal adenopathy in the right paratracheal prevascular and lateral aortic regions, largest lymph node in the lateral aortic region measuring 2.4 cm.  Lymphadenopathy in the inferior jugular and supraclavicular regions, left side greater than right.  Mild lymphadenopathy in the right axilla. -Multiple small hypervascular lesions seen in the right and left lobes consistent with liver metastasis. -Bone scan on 12/12/2019 did not show any bone metastasis.  2.  Right colonic mass: -CT scan showed constricting mass involving the ascending colon measuring 3.6 x 3.5 cm with a small 1 cm right pericolonic lymph node. -She never had a colonoscopy.  Denies any bleeding per rectum or melena.    PLAN:  1.  Metastatic left breast cancer: -I have reviewed MRI of the breast, CT CAP results with the patient. -She has mediastinal lymphadenopathy and involvement of the skin of the medial right breast with right axillary adenopathy. -There are multiple  small liver lesions which could be arising from breast cancer or from colon cancer.  Hence have recommended biopsy of the liver lesions. -I have recommended palliative chemotherapy with single agent paclitaxel 3 weeks on 1 week off. -Once better control of her breast cancer is achieved, we can discontinue chemotherapy and start her on aromatase inhibitor plus CDK 4/6 inhibitor. -We will also do germline mutation testing.  She already has a port placed. -We will likely start chemotherapy after the liver biopsy.  2.  Right colonic mass: -I have recommended colonoscopy and biopsy.  We have also recommended biopsy of the liver lesions.   All questions were answered. The patient knows to call the clinic with any problems, questions or concerns.    [name], MD 12/17/19

## 2019-12-17 NOTE — Progress Notes (Signed)
I met with patient before the visit today with Dr. Delton Coombes.  I helped explain the information given to her by the OR about her port a cath.  I went over the purpose of the port and how we will use it.  I removed her dressings over the surgical sites. Site is clean and dry.  Steristrips remain in place.

## 2019-12-17 NOTE — Patient Instructions (Addendum)
[place] [facility] Discharge Instructions  You were seen today by Dr. Delton Coombes.  He talked with you about your family history and your personal history.  He talked with you about the recent scans and tests that Dr. Lindi Adie set you up for.  Dr. Delton Coombes talked with you about the spots of concern on the CT scan.  You have left breast cancer that has spread to the right breast and your liver.  You also have an area of concern in your colon that is possibly a secondary cancer.  Breast cancer does not spread to the colon.  Dr. Delton Coombes talked with you about getting a biopsy of the area in your liver to see if it is from the breast cancer or if it is spread from the colon.  These are surprise findings and changes the outcomes of our treatment. He talked with you about this now being Stage IV cancer.  We cannot cure this cancer at this time.  We can try to control it from growing.   Dr. Delton Coombes talked with you about the chemotherapy.  He said that we will decrease the intensity of the chemotherapy since our intention of treatment is no longer cure.  We want something that will work immediately and shrink the tumors.  After that we will use a pill to control it.    Dr. Delton Coombes discussed a lot of things with you today.  Next steps are:  1. Get biopsy of area in liver to determine if it is coming from breast or colon 2. Have echocardiogram of heart to make sure your heart is functioning effectively.  Some chemotherapies that we may use down the line could cause issues with your heart and we want to get a baseline study 3. We will refer you to a gastroenterologist to have a colonoscopy.  We want to get a better idea of what is going on in your colon 4.  Not urgent but we will refer you to genetic counselor to see if you have a hereditary type cancer.  This could benefit both you and your daughters.   5. We will start chemotherapy after the liver biopsy.        Thank  you for choosing [place] [facility] to provide your oncology and hematology care.  To afford each patient quality time with our provider, please arrive at least 15 minutes before your scheduled appointment time.   If you have a lab appointment with the [facility] please come in thru the  Main Entrance and check in at the main information desk  You need to re-schedule your appointment should you arrive 10 or more minutes late.  We strive to give you quality time with our providers, and arriving late affects you and other patients whose appointments are after yours.  Also, if you no show three or more times for appointments you may be dismissed from the clinic at the providers discretion.     Again, thank you for choosing [facility].  Our hope is that these requests will decrease the amount of time that you wait before being seen by our physicians.       _____________________________________________________________  Should you have questions after your visit to [facility], please contact our office at [phone] between the hours of 8:00 a.m. and 4:30 p.m.  Voicemails left after 4:00 p.m. will not be returned until the following business day.  For prescription refill requests, have your pharmacy contact our office and allow 72 hours.    Cancer Center Support Programs:   > Cancer Support Group  2nd Tuesday of the month 1pm-2pm, Journey Room

## 2019-12-17 NOTE — Progress Notes (Signed)
START ON PATHWAY REGIMEN - Breast     A cycle is every 28 days (3 weeks on and 1 week off):     Paclitaxel   **Always confirm dose/schedule in your pharmacy ordering system**  Patient Characteristics: Distant Metastases or Locoregional Recurrent Disease - Unresected or Locally Advanced Unresectable Disease Progressing after Neoadjuvant and Local Therapies, HER2 Negative/Unknown/Equivocal, ER Positive, Chemotherapy, First Line Therapeutic Status: Distant Metastases ER Status: Positive (+) HER2 Status: Negative (-) PR Status: Negative (-) Therapy Approach Indicated: Standard Chemotherapy/Endocrine Therapy Line of Therapy: First Line Intent of Therapy: Non-Curative / Palliative Intent, Discussed with Patient 

## 2019-12-19 ENCOUNTER — Other Ambulatory Visit: Payer: Self-pay

## 2019-12-19 ENCOUNTER — Ambulatory Visit (HOSPITAL_COMMUNITY)
Admission: RE | Admit: 2019-12-19 | Discharge: 2019-12-19 | Disposition: A | Discharge status: Home / Self Care | Payer: Medicare HMO | Source: Ambulatory Visit | Attending: Hematology | Admitting: Hematology

## 2019-12-19 ENCOUNTER — Encounter: Payer: Self-pay | Admitting: Gastroenterology

## 2019-12-19 ENCOUNTER — Encounter (HOSPITAL_COMMUNITY): Payer: Self-pay

## 2019-12-19 ENCOUNTER — Telehealth (INDEPENDENT_AMBULATORY_CARE_PROVIDER_SITE_OTHER): Payer: Medicare HMO | Admitting: Gastroenterology

## 2019-12-19 ENCOUNTER — Telehealth: Payer: Self-pay

## 2019-12-19 DIAGNOSIS — K6389 Other specified diseases of intestine: Secondary | ICD-10-CM | POA: Diagnosis not present

## 2019-12-19 DIAGNOSIS — Z170 Estrogen receptor positive status [ER+]: Secondary | ICD-10-CM

## 2019-12-19 NOTE — Patient Instructions (Signed)
We will get you scheduled for colonoscopy in the near future with Dr. Gala Romney. Hold iron x 7 days prior to your procedure.  1 day prior to procedure: Take one half dose of metformin (250 mg in the morning and evening) Day of procedure: Do not take Metformin the morning of your procedure.  Our scheduling team will reach out to you to schedule the procedure. Do not hesitate to call with questions or concerns.   Aliene Altes, PA-C [facility]

## 2019-12-19 NOTE — Progress Notes (Signed)
Crystal Vega Female, 69 y.o., [birth year] MRN:  [id number] Phone:  [phone] (H) ... PCP:  Lemmie Evens, MD Coverage:  [place] [employer] Medicare Hmo/Ppo Next Appt With Radiology ([facility]-US 2) 12/28/2019 at 8:00 AM  RE: Biopsy Received: Today Message Details  [name], MD  [name] for US guided liver lesion biopsy.   [name]   Previous Messages  ----- Message -----  From: [name]  Sent: 12/18/2019 11:37 AM EDT  To: Ir Procedure Requests  Subject: Biopsy                      Procedure Requested: CT Biopsy    Reason for Procedure: left breast cancer, liver mets, questionable secondary colon cancer    Provider Requesting: [name]  Provider Telephone: [phone]    Other Info: Rad exams in Epic

## 2019-12-19 NOTE — Telephone Encounter (Signed)
Called pt, TCS w/Propofol w/RMR (ASA 3) scheduled for 01/07/20 at 12:45pm. Pt informed to hold Iron for 7 days prior to TCS. Orders entered.

## 2019-12-19 NOTE — Progress Notes (Signed)
Referring Provider: [name], MD Primary Care Physician:  Lemmie Evens, MD  Primary GI: Dr. Gala Romney  Patient Location: Home   Provider Location: [facility] office   Reason for Visit: Needs colonoscopy; newly diagnosed left breast cancer, liver mets, questionable secondary colon cancer   Persons present on the virtual encounter, with roles: Aliene Altes, PA-C (provider); Crystal Vega (patient)   Total time (minutes) spent on medical discussion: 17 minutes  Virtual Visit via Telephone Note Due to COVID-19, visit is conducted virtually and was requested by patient.   I connected with Crystal Vega on 12/23/19 at  3:30 PM EDT by video and verified that I am speaking with the correct person using two identifiers.   I discussed the limitations, risks, security and privacy concerns of performing an evaluation and management service by video and the availability of in person appointments. I also discussed with the patient that there may be a patient responsible charge related to this service. The patient expressed understanding and agreed to proceed.  Chief Complaint  Patient presents with  . Consult    TCS     History of Present Illness: 69 year old female presenting today at the request of Dr. Delton Coombes for colonoscopy due to recent diagnosis of left breast cancer, liver mets, and questionable secondary colon cancer.   Reviewed last office visit with Dr. Delton Coombes 12/17/2019.  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  Right breast upper outer quadrant biopsy was also positive for malignancy. CT CAP with contrast 12/12/19 with bulky left subpectoral, axillary, and mediastinal lymphadenopathy, consistent with metastatic disease. Lymphadenopathy also seen in the lower neck, left side greater than right, and mildly enlarged lymph node in the right axilla. Multiple small liver metastases. Annular constricting mass in the ascending colon measuring  3.6 x 3.5 cm, highly suspicious for primary colon carcinoma. Adjacent sub-cm right pericolonic lymph nodes, consistent with local lymph node metastases. Recommended colonoscopy with biopsy. She is also scheduled for liver biopsy on 7/30. Dr. Delton Coombes has recommended palliative chemotherapy with single agent paclitaxel 3 weeks on 1 week off. Plans to start chemo after liver biopsy.   Today: No prior colonoscopy. Occasional lower abdominal cramping prior to BMs that resolves about 30 minutes thereafter. Nothing that is regular. BM every other day to every 2 days at baseline. Stools are typically soft. No constipation. No diarrhea typically. No brbpr or melena. Stools are dark on iron. Reports having a stool test that was positive about 1 year ago. Reviewed record. FOBT positive 07/20/2017.    No nausea or vomiting. No GERD symptoms or dysphagia.   Has been on Norco for a while as needed for chronic back pain.   Mother had cancer in her "stomach". Not sure if it was actually her stomach or colon.  Past Medical History:  Diagnosis Date  . Anemia   . Arthritis    per patient " left knee"  . Essential hypertension, benign   . Metastatic breast cancer ([place])    left breast  . Type 2 diabetes mellitus ([place])      Past Surgical History:  Procedure Laterality Date  . CATARACT EXTRACTION W/ INTRAOCULAR LENS IMPLANT Right   . HYSTEROSCOPY WITH D & C N/A 06/05/2014   Procedure: DILATATION AND CURETTAGE /HYSTEROSCOPY;  Surgeon: Florian Buff, MD;  Location: [facility];  Service: Gynecology;  Laterality: N/A;  . POLYPECTOMY N/A 06/05/2014   Procedure: ENDOMETRIAL POLYPECTOMY;  Surgeon: Florian Buff, MD;  Location: [facility]  ORS;  Service: Gynecology;  Laterality: N/A;  . PORTACATH PLACEMENT N/A 12/14/2019   Procedure: INSERTION PORT-A-CATH WITH ULTRASOUND GUIDANCE;  Surgeon: Donnie Mesa, MD;  Location: [place];  Service: General;  Laterality: N/A;  . TUBAL LIGATION       Current Meds  Medication Sig  .  ferrous sulfate 325 (65 FE) MG tablet Take 325 mg by mouth daily with breakfast.  . hydrochlorothiazide (HYDRODIURIL) 25 MG tablet Take 25 mg by mouth daily.  Marland Kitchen HYDROcodone-acetaminophen (NORCO/VICODIN) 5-325 MG tablet Take 1 tablet by mouth every 6 (six) hours as needed for moderate pain.  Marland Kitchen ibuprofen (ADVIL,MOTRIN) 800 MG tablet Take 800 mg by mouth every 8 (eight) hours as needed for moderate pain.   Marland Kitchen losartan (COZAAR) 50 MG tablet Take 50 mg by mouth daily.  . metFORMIN (GLUCOPHAGE) 500 MG tablet Take 500 mg by mouth daily with breakfast.   . oxybutynin (DITROPAN) 5 MG tablet Take 5 mg by mouth daily.   . potassium chloride (MICRO-K) 10 MEQ CR capsule Take 10 mEq by mouth daily.   . pravastatin (PRAVACHOL) 10 MG tablet Take 10 mg by mouth daily.      Family History  Problem Relation Age of Onset  . Diabetes Mellitus II Father   . Cancer Mother        Deceased at age 57; patient not sure if cancer was in her stomach or colon    Social History   Socioeconomic History  . Marital status: Married    Spouse name: Herbie Baltimore  . Number of children: 2  . Years of education: Not on file  . Highest education level: Not on file  Occupational History  . Occupation: retired  Tobacco Use  . Smoking status: Former Smoker    Types: Cigarettes  . Smokeless tobacco: Never Used  . Tobacco comment: quit about 40+ years ago  Vaping Use  . Vaping Use: Never used  Substance and Sexual Activity  . Alcohol use: Never  . Drug use: No  . Sexual activity: Not on file  Other Topics Concern  . Not on file  Social History Narrative  . Not on file   Social Determinants of Health   Financial Resource Strain: Low Risk   . Difficulty of Paying Living Expenses: Not hard at all  Food Insecurity: No Food Insecurity  . Worried About Charity fundraiser in the Last Year: Never true  . Ran Out of Food in the Last Year: Never true  Transportation Needs: No Transportation Needs  . Lack of Transportation  (Medical): No  . Lack of Transportation (Non-Medical): No  Physical Activity: Inactive  . Days of Exercise per Week: 0 days  . Minutes of Exercise per Session: 0 min  Stress: No Stress Concern Present  . Feeling of Stress : Only a little  Social Connections: Moderately Integrated  . Frequency of Communication with Friends and Family: Three times a week  . Frequency of Social Gatherings with Friends and Family: Three times a week  . Attends Religious Services: 1 to 4 times per year  . Active Member of Clubs or Organizations: No  . Attends Archivist Meetings: Never  . Marital Status: Married       Review of Systems: Gen: Denies fever, chills, cold or flulike symptoms, presyncope, syncope, or significant weight loss. CV: Denies chest pain or heart palpitations. Resp: Admits to some SOB with exertion. No SOB at rest. No cough.  GI: See HPI Derm: Denies rash  Heme: See HPI  Observations/Objective: No distress. Alert and oriented. Pleasant. Well nourished. Normal mood and affect. Unable to perform complete physical exam due to video encounter.    Assessment and Plan: 69 year old female recently diagnosed with metastatic left breast cancer with involvement of several lymph nodes. Also found to have liver mets and annular constricting mass in the ascending colon measuring 3.6 x 3.5 cm on CT completed 12/12/2019 that is highly suspicious for primary colon carcinoma .  She presents today at the request of Dr. Delton Coombes for colonoscopy.  Notably, she is scheduled for liver biopsy on 7/30.  She has never had a colonoscopy.  FOBT positive 07/20/2017. She is without any significant upper or lower GI symptoms.  Denies BRBPR or melena. Her stools are dark on iron. No unintentional weight loss.  She reports her mother had cancer and passed at age 57.  She is not sure if cancer was in her stomach or colon.  We will plan to proceed with colonoscopy with propofol with Dr. Gala Romney ASAP. The risks,  benefits, and alternatives have been discussed in detail with patient. They have stated understanding and desire to proceed.  ASA III Advised to hold iron for 7 days prior to procedure. See separate instructions for diabetes medication adjustments for procedure. Follow-up as Dr. Gala Romney recommends.   Follow Up Instructions: Follow-up as Dr. Gala Romney recommends at the time of colonoscopy.  Advise she call with questions or concerns.   I discussed the assessment and treatment plan with the patient. The patient was provided an opportunity to ask questions and all were answered. The patient agreed with the plan and demonstrated an understanding of the instructions.   The patient was advised to call back or seek an in-person evaluation if the symptoms worsen or if the condition fails to improve as anticipated.  I provided 17 minutes of non-face-to-face time during this encounter.  Aliene Altes, PA-C [facility]

## 2019-12-20 ENCOUNTER — Encounter (HOSPITAL_COMMUNITY): Payer: Self-pay | Admitting: Surgery

## 2019-12-20 ENCOUNTER — Telehealth: Payer: Self-pay | Admitting: *Deleted

## 2019-12-20 ENCOUNTER — Encounter: Payer: Self-pay | Admitting: *Deleted

## 2019-12-20 NOTE — Telephone Encounter (Signed)
Pt consented to a virtual visit on 12/19/19.

## 2019-12-20 NOTE — Patient Instructions (Addendum)
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  You will be treated in the clinic with a chemotherapy drug called paclitaxel.  You will be treated weekly for 3 weeks, then have 1 week off before you return to the clinic for your next cycle of treatment.  The intent of treatment is to control your disease, keep it from spreading further, and to help alleviate any symptoms you may be having related to your cancer.  You will see the doctor regularly throughout treatment.  We will obtain blood work from you prior to every treatment and monitor your results to make sure it is safe to give your treatment. The doctor monitors your response to treatment by the way you are feeling, your blood work, and by obtaining scans periodically.  There will be wait times while you are here for treatment.  It will take about 30 minutes to 1 hour for your lab work to result.  Then there will be wait times while pharmacy mixes your medications.    Medications you will receive in the clinic prior to your chemotherapy medications:  Aloxi:  ALOXI is used in adults to help prevent the nausea and vomiting that happens with certain chemotherapy drugs.  Aloxi is a long acting medication, and will remain in your system for about 2 days.   Emend:  This is an anti-nausea medication that is used with Aloxi to help prevent nausea and vomiting caused by chemotherapy.  Dexamethasone:  This is a steroid given prior to chemotherapy to help prevent allergic reactions; it may also help prevent and control nausea and diarrhea.   Pepcid:  This medication is a histamine blocker that helps prevent and allergic reaction to your chemotherapy.   Benadryl:  This is a histamine blocker (different from the Pepcid) that helps prevent allergic/infusion reactions to your chemotherapy. This medication may cause dizziness/drowsiness.    Paclitaxel (Taxol)  About This  Drug  Paclitaxel is a drug used to treat cancer. It is given in the vein (IV).  This will take 1 hour to infuse.  This first infusion will take longer because it is increased slowly to monitor for reactions.  The nurse will be in the room with you for the first 15 minutes of the first infusion.  Possible Side Effects  . Hair loss. Hair loss is often temporary, although with certain medicine, hair loss can sometimes be permanent. Hair loss may happen suddenly or gradually. If you lose hair, you may lose it from your head, face, armpits, pubic area, chest, and/or legs. You may also notice your hair getting thin.  . Swelling of your legs, ankles and/or feet (edema)  . Flushing  . Nausea and throwing up (vomiting)  . Loose bowel movements (diarrhea)  . Bone marrow depression. This is a decrease in the number of white blood cells, red blood cells, and platelets. This may raise your risk of infection, make you tired and weak (fatigue), and raise your risk of bleeding.  . Effects on the nerves are called peripheral neuropathy. You may feel numbness, tingling, or pain in your hands and feet. It may be hard for you to button your clothes, open jars, or walk as usual. The effect on the nerves may get worse with more doses of the drug. These effects get better in some people after the drug is stopped but it does not get better in all people.  . Changes in your liver  function  . Bone, joint and muscle pain  . Abnormal EKG  . Allergic reaction: Allergic reactions, including anaphylaxis are rare but may happen in some patients. Signs of allergic reaction to this drug may be swelling of the face, feeling like your tongue or throat are swelling, trouble breathing, rash, itching, fever, chills, feeling dizzy, and/or feeling that your heart is beating in a fast or not normal way. If this happens, do not take another dose of this drug. You should get urgent medical treatment.  . Infection  . Changes in  your kidney function.  Note: Each of the side effects above was reported in 20% or greater of patients treated with paclitaxel. Not all possible side effects are included above.   Warnings and Precautions  . Severe allergic reactions  . Severe bone marrow depression   Treating Side Effects  . To help with hair loss, wash with a mild shampoo and avoid washing your hair every day.  . Avoid rubbing your scalp, instead, pat your hair or scalp dry  . Avoid coloring your hair  . Limit your use of hair spray, electric curlers, blow dryers, and curling irons.  . If you are interested in getting a wig, talk to your nurse. You can also call the [place] at [phone] to find out information about the "Look Good, Feel Better" program close to where you live. It is a free program where women getting chemotherapy can learn about wigs, turbans and scarves as well as makeup techniques and skin and nail care.  . Ask your doctor or nurse about medicines that are available to help stop or lessen diarrhea and/or nausea.  . To help with nausea and vomiting, eat small, frequent meals instead of three large meals a day. Choose foods and drinks that are at room temperature. Ask your nurse or doctor about other helpful tips and medicine that is available to help or stop lessen these symptoms.  . If you get diarrhea, eat low-fiber foods that are high in protein and calories and avoid foods that can irritate your digestive tracts or lead to cramping. Ask your nurse or doctor about medicine that can lessen or stop your diarrhea.  . Mouth care is very important. Your mouth care should consist of routine, gentle cleaning of your teeth or dentures and rinsing your mouth with a mixture of 1/2 teaspoon of salt in 8 ounces of water or  teaspoon of baking soda in 8 ounces of water. This should be done at least after each meal and at bedtime.  . If you have mouth sores, avoid mouthwash that has  alcohol. Also avoid alcohol and smoking because they can bother your mouth and throat.  . Drink plenty of fluids (a minimum of eight glasses per day is recommended).  . Take your temperature as your doctor or nurse tells you, and whenever you feel like you may have a fever.  . Talk to your doctor or nurse about precautions you can take to avoid infections and bleeding.  . Be careful when cooking, walking, and handling sharp objects and hot liquids.  Food and Drug Interactions  . There are no known interactions of paclitaxel with food.  . This drug may interact with other medicines. Tell your doctor and pharmacist about all the medicines and dietary supplements (vitamins, minerals, herbs and others) that you are taking at this time.  . The safety and use of dietary supplements and alternative diets are often not known. Using  these might affect your cancer or interfere with your treatment. Until more is known, you should not use dietary supplements or alternative diets without your cancer doctor's help.  When to Call the Doctor  Call your doctor or nurse if you have any of the following symptoms and/or any new or unusual symptoms:  . Fever of 100.4 F (38 C) or above  . Chills  . Redness, pain, warmth, or swelling at the IV site during the infusion  . Signs of allergic reaction: swelling of the face, feeling like your tongue or throat are swelling, trouble breathing, rash, itching, fever, chills, feeling dizzy, and/or feeling that your heart is beating in a fast or not normal way  . Feeling that your heart is beating in a fast or not normal way (palpitations)  . Weight gain of 5 pounds in one week (fluid retention)  . Decreased urine or very dark urine  . Signs of liver problems: dark urine, pale bowel movements, bad stomach pain, feeling very tired and weak, unusual  itching, or yellowing of the eyes or skin  . Heavy menstrual period that lasts longer than normal  . Easy  bruising or bleeding  . Nausea that stops you from eating or drinking, and/or that is not relieved by prescribed medicines.  . Loose bowel movements (diarrhea) more than 4 times a day or diarrhea with weakness or lightheadedness  . Pain in your mouth or throat that makes it hard to eat or drink  . Lasting loss of appetite or rapid weight loss of five pounds in a week  . Signs of peripheral neuropathy: numbness, tingling, or decreased feeling in fingers or toes; trouble walking or changes in the way you walk; or feeling clumsy when buttoning clothes, opening jars, or other routine activities  . Joint and muscle pain that is not relieved by prescribed medicines  . Extreme fatigue that interferes with normal activities  . While you are getting this drug, please tell your nurse right away if you have any pain, redness, or swelling at the site of the IV infusion.  . If you think you are pregnant.  Reproduction Warnings  . Pregnancy warning: This drug may have harmful effects on the unborn child, it is recommended that effective methods of birth control should be used during your cancer treatment. Let your doctor know right away if you think you may be pregnant.  . Breast feeding warning: Women should not breast feed during treatment because this drug could enter the breastmilk and cause harm to a breast feeding baby.   SELF CARE ACTIVITIES WHILE RECEIVING CHEMOTHERAPY:  Hydration Increase your fluid intake 48 hours prior to treatment and drink at least 8 to 12 cups (64 ounces) of water/decaffeinated beverages per day after treatment. You can still have your cup of coffee or soda but these beverages do not count as part of your 8 to 12 cups that you need to drink daily. No alcohol intake.  Medications Continue taking your normal prescription medication as prescribed.  If you start any new herbal or new supplements please let us know first to make sure it is safe.  Mouth Care Have teeth  cleaned professionally before starting treatment. Keep dentures and partial plates clean. Use soft toothbrush and do not use mouthwashes that contain alcohol. Biotene is a good mouthwash that is available at most pharmacies or may be ordered by calling [phone]. Use warm salt water gargles (1 teaspoon salt per 1 quart warm water) before  and after meals and at bedtime. If you need dental work, please let the doctor know before you go for your appointment so that we can coordinate the best possible time for you in regards to your chemo regimen. You need to also let your dentist know that you are actively taking chemo. We may need to do labs prior to your dental appointment.  Skin Care Always use sunscreen that has not expired and with SPF (Sun Protection Factor) of 50 or higher. Wear hats to protect your head from the sun. Remember to use sunscreen on your hands, ears, face, & feet.  Use good moisturizing lotions such as udder cream, eucerin, or even Vaseline. Some chemotherapies can cause dry skin, color changes in your skin and nails.    . Avoid long, hot showers or baths. . Use gentle, fragrance-free soaps and laundry detergent. . Use moisturizers, preferably creams or ointments rather than lotions because the thicker consistency is better at preventing skin dehydration. Apply the cream or ointment within 15 minutes of showering. Reapply moisturizer at night, and moisturize your hands every time after you wash them.  Hair Loss (if your doctor says your hair will fall out)  . If your doctor says that your hair is likely to fall out, decide before you begin chemo whether you want to wear a wig. You may want to shop before treatment to match your hair color. . Hats, turbans, and scarves can also camouflage hair loss, although some people prefer to leave their heads uncovered. If you go bare-headed outdoors, be sure to use sunscreen on your scalp. . Cut your hair short. It eases the inconvenience of  shedding lots of hair, but it also can reduce the emotional impact of watching your hair fall out. . Don't perm or color your hair during chemotherapy. Those chemical treatments are already damaging to hair and can enhance hair loss. Once your chemo treatments are done and your hair has grown back, it's OK to resume dyeing or perming hair.  With chemotherapy, hair loss is almost always temporary. But when it grows back, it may be a different color or texture. In older adults who still had hair color before chemotherapy, the new growth may be completely gray.  Often, new hair is very fine and soft.  Infection Prevention Please wash your hands for at least 30 seconds using warm soapy water. Handwashing is the #1 way to prevent the spread of germs. Stay away from sick people or people who are getting over a cold. If you develop respiratory systems such as green/yellow mucus production or productive cough or persistent cough let us know and we will see if you need an antibiotic. It is a good idea to keep a pair of gloves on when going into grocery stores/[employer] to decrease your risk of coming into contact with germs on the carts, etc. Carry alcohol hand gel with you at all times and use it frequently if out in public. If your temperature reaches 100.4 or higher please call the clinic and let us know.  If it is after hours or on the weekend please go to the ER if your temperature is over 100.4.  Please have your own personal thermometer at home to use.    Sex and bodily fluids If you are going to have sex, a condom must be used to protect the person that isn't taking chemotherapy. Chemo can decrease your libido (sex drive). For a few days after chemotherapy, chemotherapy can be excreted  through your bodily fluids.  When using the toilet please close the lid and flush the toilet twice.  Do this for a few day after you have had chemotherapy.   Effects of chemotherapy on your sex life Some changes are simple  and won't last long. They won't affect your sex life permanently.  Sometimes you may feel: . too tired . not strong enough to be very active . sick or sore  . not in the mood . anxious or low  Your anxiety might not seem related to sex. For example, you may be worried about the cancer and how your treatment is going. Or you may be worried about money, or about how you family are coping with your illness.  These things can cause stress, which can affect your interest in sex. It's important to talk to your partner about how you feel.  Remember - the changes to your sex life don't usually last long. There's usually no medical reason to stop having sex during chemo. The drugs won't have any long term physical effects on your performance or enjoyment of sex. Cancer can't be passed on to your partner during sex  Contraception It's important to use reliable contraception during treatment. Avoid getting pregnant while you or your partner are having chemotherapy. This is because the drugs may harm the baby. Sometimes chemotherapy drugs can leave a man or woman infertile.  This means you would not be able to have children in the future. You might want to talk to someone about permanent infertility. It can be very difficult to learn that you may no longer be able to have children. Some people find counselling helpful. There might be ways to preserve your fertility, although this is easier for men than for women. You may want to speak to a fertility expert. You can talk about sperm banking or harvesting your eggs. You can also ask about other fertility options, such as donor eggs. If you have or have had breast cancer, your doctor might advise you not to take the contraceptive pill. This is because the hormones in it might affect the cancer. It is not known for sure whether or not chemotherapy drugs can be passed on through semen or secretions from the vagina. Because of this some doctors advise people to use a  barrier method if you have sex during treatment. This applies to vaginal, anal or oral sex. Generally, doctors advise a barrier method only for the time you are actually having the treatment and for about a week after your treatment. Advice like this can be worrying, but this does not mean that you have to avoid being intimate with your partner. You can still have close contact with your partner and continue to enjoy sex.  Animals If you have cats or birds we just ask that you not change the litter or change the cage.  Please have someone else do this for you while you are on chemotherapy.   Food Safety During and After Cancer Treatment Food safety is important for people both during and after cancer treatment. Cancer and cancer treatments, such as chemotherapy, radiation therapy, and stem cell/bone marrow transplantation, often weaken the immune system. This makes it harder for your body to protect itself from foodborne illness, also called food poisoning. Foodborne illness is caused by eating food that contains harmful bacteria, parasites, or viruses.  Foods to avoid Some foods have a higher risk of becoming tainted with bacteria. These include: Marland Kitchen Unwashed fresh fruit and  vegetables, especially leafy vegetables that can hide dirt and other contaminants . Raw sprouts, such as alfalfa sprouts . Raw or undercooked beef, especially ground beef, or other raw or undercooked meat and poultry . Fatty, fried, or spicy foods immediately before or after treatment.  These can sit heavy on your stomach and make you feel nauseous. . Raw or undercooked shellfish, such as oysters. . Sushi and sashimi, which often contain raw fish.  . Unpasteurized beverages, such as unpasteurized fruit juices, raw milk, raw yogurt, or cider . Undercooked eggs, such as soft boiled, over easy, and poached; raw, unpasteurized eggs; or foods made with raw egg, such as homemade raw cookie dough and homemade mayonnaise  Simple steps  for food safety  Shop smart. . Do not buy food stored or displayed in an unclean area. . Do not buy bruised or damaged fruits or vegetables. . Do not buy cans that have cracks, dents, or bulges. . Pick up foods that can spoil at the end of your shopping trip and store them in a cooler on the way home.  Prepare and clean up foods carefully. . Rinse all fresh fruits and vegetables under running water, and dry them with a clean towel or paper towel. . Clean the top of cans before opening them. . After preparing food, wash your hands for 20 seconds with hot water and soap. Pay special attention to areas between fingers and under nails. . Clean your utensils and dishes with hot water and soap. Marland Kitchen Disinfect your kitchen and cutting boards using 1 teaspoon of liquid, unscented bleach mixed into 1 quart of water.    Dispose of old food. . Eat canned and packaged food before its expiration date (the "use by" or "best before" date). . Consume refrigerated leftovers within 3 to 4 days. After that time, throw out the food. Even if the food does not smell or look spoiled, it still may be unsafe. Some bacteria, such as Listeria, can grow even on foods stored in the refrigerator if they are kept for too long.  Take precautions when eating out. . At restaurants, avoid buffets and salad bars where food sits out for a long time and comes in contact with many people. Food can become contaminated when someone with a virus, often a norovirus, or another "bug" handles it. . Put any leftover food in a "to-go" container yourself, rather than having the server do it. And, refrigerate leftovers as soon as you get home. . Choose restaurants that are clean and that are willing to prepare your food as you order it cooked.   AT HOME MEDICATIONS:  Compazine/Prochlorperazine 10mg  tablet. Take 1 tablet every 6 hours as needed for nausea/vomiting (This can make you sleepy).   EMLA cream. Apply a quarter size amount to port site 1 hour prior to chemo. Do not rub in. Cover with plastic wrap.    Diarrhea Sheet   If you are having loose stools/diarrhea, please purchase Imodium and begin taking as outlined:  At the first sign of poorly formed or loose stools you should begin taking Imodium (loperamide) 2 mg capsules.  Take two tablets (4mg ) followed by one tablet (2mg ) every 2 hours - DO NOT EXCEED 8 tablets in 24 hours.  If it is bedtime and you are having loose stools, take 2 tablets at bedtime, then 2 tablets every 4 hours until morning.   Always call the [facility] if you are having loose stools/diarrhea that you can't get under control.  Loose stools/diarrhea leads to dehydration (loss of water) in your body.  We have other options of trying to get the loose stools/diarrhea to stop but you must let us know!   Constipation Sheet  Colace - 100 mg capsules - take 2 capsules daily.  If this doesn't help then you can increase to 2 capsules twice daily.  Please call if the above does not work for you. Do not go more than 2 days without a bowel movement.  It is very important that you do not become constipated.  It will make you feel sick to your stomach (nausea) and can cause abdominal pain and vomiting.   Nausea Sheet   Compazine/Prochlorperazine 10mg  tablet. Take 1 tablet every 6 hours as needed for nausea/vomiting (This can make you drowsy).  If you are having persistent nausea (nausea that does not stop) please call the [facility] and let us know the amount of nausea that you are experiencing.  If you begin to vomit, you need to call the [facility] and if it is the weekend and you have vomited more than one time and can't get it to stop-go to the Emergency Room.  Persistent nausea/vomiting can lead to dehydration (loss of fluid  in your body) and will make you feel very weak and unwell. Ice chips, sips of clear liquids, foods that are at room temperature, crackers, and toast tend to be better tolerated.   SYMPTOMS TO REPORT AS SOON AS POSSIBLE AFTER TREATMENT:  FEVER GREATER THAN 100.4 F  CHILLS WITH OR WITHOUT FEVER  NAUSEA AND VOMITING THAT IS NOT CONTROLLED WITH YOUR NAUSEA MEDICATION  UNUSUAL SHORTNESS OF BREATH  UNUSUAL BRUISING OR BLEEDING  TENDERNESS IN MOUTH AND THROAT WITH OR WITHOUT PRESENCE OF ULCERS  URINARY PROBLEMS  BOWEL PROBLEMS  UNUSUAL RASH      Wear comfortable clothing and clothing appropriate for easy access to any Portacath or PICC line. Let us know if there is anything that we can do to make your therapy better!    What to do if you need assistance after hours or on the weekends: CALL [phone].  HOLD on the line, do not hang up.  You will hear multiple messages but at the end you will be connected with a nurse triage line.  They will contact the doctor if necessary.  Most of the time they will be able to assist you.  Do not call the hospital operator.      I have been informed and understand all of the instructions given to me and have received a copy. I have been instructed to call the clinic [phone] or my family physician as soon as possible for continued medical care, if indicated. I do not have any more questions at this time but understand that I may call the [facility] or the Patient Navigator at [phone] during office hours should I have questions or need assistance in obtaining follow-up care.

## 2019-12-20 NOTE — Telephone Encounter (Signed)
Crystal Vega, you are scheduled for a virtual visit with your provider today.  Just as we do with appointments in the office, we must obtain your consent to participate.  Your consent will be active for this visit and any virtual visit you may have with one of our providers in the next 365 days.  If you have a MyChart account, I can also send a copy of this consent to you electronically.  All virtual visits are billed to your insurance company just like a traditional visit in the office.  As this is a virtual visit, video technology does not allow for your provider to perform a traditional examination.  This may limit your provider's ability to fully assess your condition.  If your provider identifies any concerns that need to be evaluated in person or the need to arrange testing such as labs, EKG, etc, we will make arrangements to do so.  Although advances in technology are sophisticated, we cannot ensure that it will always work on either your end or our end.  If the connection with a video visit is poor, we may have to switch to a telephone visit.  With either a video or telephone visit, we are not always able to ensure that we have a secure connection.   I need to obtain your verbal consent now.   Are you willing to proceed with your visit today?

## 2019-12-23 ENCOUNTER — Encounter: Payer: Self-pay | Admitting: Gastroenterology

## 2019-12-25 ENCOUNTER — Inpatient Hospital Stay (HOSPITAL_COMMUNITY): Payer: Medicare HMO

## 2019-12-25 ENCOUNTER — Other Ambulatory Visit (HOSPITAL_COMMUNITY): Payer: Self-pay | Admitting: *Deleted

## 2019-12-25 ENCOUNTER — Encounter (HOSPITAL_COMMUNITY): Payer: Self-pay

## 2019-12-25 ENCOUNTER — Other Ambulatory Visit: Payer: Self-pay

## 2019-12-25 DIAGNOSIS — Z95828 Presence of other vascular implants and grafts: Secondary | ICD-10-CM

## 2019-12-25 DIAGNOSIS — Z170 Estrogen receptor positive status [ER+]: Secondary | ICD-10-CM

## 2019-12-25 DIAGNOSIS — C50812 Malignant neoplasm of overlapping sites of left female breast: Secondary | ICD-10-CM | POA: Diagnosis not present

## 2019-12-25 HISTORY — DX: Presence of other vascular implants and grafts: Z95.828

## 2019-12-25 MED ORDER — PROCHLORPERAZINE MALEATE 10 MG PO TABS: 10.0000 mg | ORAL_TABLET | Freq: Four times a day (QID) | ORAL | 1 refills | Status: DC | PRN

## 2019-12-25 MED ORDER — LIDOCAINE-PRILOCAINE 2.5-2.5 % EX CREA: TOPICAL_CREAM | CUTANEOUS | 3 refills | Status: DC

## 2019-12-25 MED ORDER — HYDROMORPHONE HCL 2 MG PO TABS
2.0000 mg | ORAL_TABLET | Freq: Once | ORAL | Status: DC
  Administered 2019-12-25: 2 mg via ORAL
  Filled 2019-12-25: qty 1

## 2019-12-25 MED ORDER — ONDANSETRON HCL 4 MG/2ML IJ SOLN
INTRAMUSCULAR | Status: AC
  Filled 2019-12-25: qty 2

## 2019-12-25 MED ORDER — HYDROMORPHONE HCL 2 MG PO TABS: 2.0000 mg | ORAL_TABLET | ORAL | 0 refills | Status: DC | PRN

## 2019-12-25 NOTE — Progress Notes (Signed)

## 2019-12-27 ENCOUNTER — Other Ambulatory Visit: Payer: Self-pay | Admitting: Physician Assistant

## 2019-12-27 ENCOUNTER — Other Ambulatory Visit: Payer: Self-pay | Admitting: Radiology

## 2019-12-28 ENCOUNTER — Other Ambulatory Visit: Payer: Self-pay

## 2019-12-28 ENCOUNTER — Ambulatory Visit (HOSPITAL_COMMUNITY)
Admission: RE | Admit: 2019-12-28 | Discharge: 2019-12-28 | Disposition: A | Discharge status: Home / Self Care | Payer: Medicare HMO | Source: Ambulatory Visit | Attending: Hematology | Admitting: Hematology

## 2019-12-28 ENCOUNTER — Encounter (HOSPITAL_COMMUNITY): Payer: Self-pay

## 2019-12-28 DIAGNOSIS — Z7984 Long term (current) use of oral hypoglycemic drugs: Secondary | ICD-10-CM | POA: Insufficient documentation

## 2019-12-28 DIAGNOSIS — C50412 Malignant neoplasm of upper-outer quadrant of left female breast: Secondary | ICD-10-CM | POA: Insufficient documentation

## 2019-12-28 DIAGNOSIS — I700 Atherosclerosis of aorta: Secondary | ICD-10-CM | POA: Insufficient documentation

## 2019-12-28 DIAGNOSIS — Z6841 Body Mass Index (BMI) 40.0 and over, adult: Secondary | ICD-10-CM | POA: Insufficient documentation

## 2019-12-28 DIAGNOSIS — C50411 Malignant neoplasm of upper-outer quadrant of right female breast: Secondary | ICD-10-CM | POA: Insufficient documentation

## 2019-12-28 DIAGNOSIS — Z20822 Contact with and (suspected) exposure to covid-19: Secondary | ICD-10-CM | POA: Diagnosis not present

## 2019-12-28 DIAGNOSIS — C50812 Malignant neoplasm of overlapping sites of left female breast: Secondary | ICD-10-CM | POA: Insufficient documentation

## 2019-12-28 DIAGNOSIS — K769 Liver disease, unspecified: Secondary | ICD-10-CM | POA: Insufficient documentation

## 2019-12-28 DIAGNOSIS — N390 Urinary tract infection, site not specified: Secondary | ICD-10-CM | POA: Diagnosis not present

## 2019-12-28 DIAGNOSIS — R509 Fever, unspecified: Secondary | ICD-10-CM | POA: Insufficient documentation

## 2019-12-28 DIAGNOSIS — I10 Essential (primary) hypertension: Secondary | ICD-10-CM | POA: Insufficient documentation

## 2019-12-28 DIAGNOSIS — Z961 Presence of intraocular lens: Secondary | ICD-10-CM | POA: Insufficient documentation

## 2019-12-28 DIAGNOSIS — D259 Leiomyoma of uterus, unspecified: Secondary | ICD-10-CM | POA: Diagnosis not present

## 2019-12-28 DIAGNOSIS — Z79899 Other long term (current) drug therapy: Secondary | ICD-10-CM | POA: Insufficient documentation

## 2019-12-28 DIAGNOSIS — C50611 Malignant neoplasm of axillary tail of right female breast: Secondary | ICD-10-CM | POA: Insufficient documentation

## 2019-12-28 DIAGNOSIS — Z170 Estrogen receptor positive status [ER+]: Secondary | ICD-10-CM | POA: Insufficient documentation

## 2019-12-28 DIAGNOSIS — C773 Secondary and unspecified malignant neoplasm of axilla and upper limb lymph nodes: Secondary | ICD-10-CM | POA: Insufficient documentation

## 2019-12-28 DIAGNOSIS — E119 Type 2 diabetes mellitus without complications: Secondary | ICD-10-CM | POA: Insufficient documentation

## 2019-12-28 DIAGNOSIS — Z87891 Personal history of nicotine dependence: Secondary | ICD-10-CM | POA: Insufficient documentation

## 2019-12-28 DIAGNOSIS — C787 Secondary malignant neoplasm of liver and intrahepatic bile duct: Secondary | ICD-10-CM | POA: Insufficient documentation

## 2019-12-28 DIAGNOSIS — R000 Tachycardia, unspecified: Secondary | ICD-10-CM | POA: Insufficient documentation

## 2019-12-28 DIAGNOSIS — Z95828 Presence of other vascular implants and grafts: Secondary | ICD-10-CM | POA: Insufficient documentation

## 2019-12-28 DIAGNOSIS — Z9841 Cataract extraction status, right eye: Secondary | ICD-10-CM | POA: Insufficient documentation

## 2019-12-28 DIAGNOSIS — Z833 Family history of diabetes mellitus: Secondary | ICD-10-CM | POA: Insufficient documentation

## 2019-12-28 DIAGNOSIS — Z791 Long term (current) use of non-steroidal anti-inflammatories (NSAID): Secondary | ICD-10-CM | POA: Insufficient documentation

## 2019-12-28 DIAGNOSIS — G8918 Other acute postprocedural pain: Secondary | ICD-10-CM | POA: Diagnosis present

## 2019-12-28 LAB — GLUCOSE, CAPILLARY
Glucose-Capillary: 122 mg/dL — ABNORMAL HIGH (ref 70–99)
Glucose-Capillary: 120 mg/dL — ABNORMAL HIGH (ref 70–99)

## 2019-12-28 LAB — PROTIME-INR
INR: 1 (ref 0.8–1.2)
Prothrombin Time: 12.4 seconds (ref 11.4–15.2)

## 2019-12-28 MED ORDER — FENTANYL CITRATE (PF) 100 MCG/2ML IJ SOLN
INTRAMUSCULAR | Status: AC
  Filled 2019-12-28: qty 2

## 2019-12-28 MED ORDER — FENTANYL CITRATE (PF) 100 MCG/2ML IJ SOLN
INTRAMUSCULAR | Status: AC | PRN
  Administered 2019-12-28 (×2): 50 ug via INTRAVENOUS

## 2019-12-28 MED ORDER — MIDAZOLAM HCL 2 MG/2ML IJ SOLN
INTRAMUSCULAR | Status: AC
  Filled 2019-12-28: qty 2

## 2019-12-28 MED ORDER — SODIUM CHLORIDE 0.9 % IV SOLN: INTRAVENOUS | Status: DC

## 2019-12-28 MED ORDER — GELATIN ABSORBABLE 12-7 MM EX MISC
CUTANEOUS | Status: AC
  Filled 2019-12-28: qty 1

## 2019-12-28 MED ORDER — LIDOCAINE HCL (PF) 1 % IJ SOLN
INTRAMUSCULAR | Status: AC
  Filled 2019-12-28: qty 30

## 2019-12-28 MED ORDER — MIDAZOLAM HCL 2 MG/2ML IJ SOLN
INTRAMUSCULAR | Status: AC | PRN
  Administered 2019-12-28 (×2): 1 mg via INTRAVENOUS

## 2019-12-28 NOTE — H&P (Signed)
Chief Complaint: Patient was seen in consultation today for an image-guided liver lesion biopsy  Referring Physician(s): Katragadda,Sreedhar  Supervising Physician: Sandi Mariscal  Patient Status: [facility] - Out-pt  History of Present Illness: Crystal Vega is a 69 y.o. female with a medical history that includes DM2, hypertension and newly diagnosed left breast cancer. After several months of left breast tenderness, swelling, and thickening, she presented to her PCP. Imaging showed a large mass in the upper outer portion of her left breast with abnormal lymph nodes and biopsy confirmed invasive ductal carcinoma. CT imaging reveals possible metastasis.   CT abdomen/pelvis 12/12/19: IMPRESSION: 6.3 cm mass in axillary tail of left breast, with diffuse left breast skin thickening and soft tissue stranding, consistent with primary breast carcinoma.  Bulky left subpectoral, axillary, and mediastinal lymphadenopathy, consistent with metastatic disease. Lymphadenopathy also seen in the lower neck, left side greater than right, and mildly enlarged lymph node in the right axilla.  Multiple small liver metastases.  Annular constricting mass in the ascending colon, highly suspicious for primary colon carcinoma. Adjacent sub-cm right pericolonic lymph nodes, consistent with local lymph node metastases. Colonoscopy is recommended.  Uterine fibroids.  Interventional Radiology has been asked to evaluate this patient for an image-guided liver lesion biopsy for further work-up and diagnosis. This case was reviewed by Dr. Anselm Pancoast.   Past Medical History:  Diagnosis Date  . Anemia   . Arthritis    per patient " left knee"  . Essential hypertension, benign   . Metastatic breast cancer ([place])    left breast  . Port-A-Cath in place 12/25/2019  . Type 2 diabetes mellitus ([place])     Past Surgical History:  Procedure Laterality Date  . CATARACT EXTRACTION W/ INTRAOCULAR LENS IMPLANT Right   .  HYSTEROSCOPY WITH D & C N/A 06/05/2014   Procedure: DILATATION AND CURETTAGE /HYSTEROSCOPY;  Surgeon: Florian Buff, MD;  Location: [facility];  Service: Gynecology;  Laterality: N/A;  . POLYPECTOMY N/A 06/05/2014   Procedure: ENDOMETRIAL POLYPECTOMY;  Surgeon: Florian Buff, MD;  Location: [facility];  Service: Gynecology;  Laterality: N/A;  . PORTACATH PLACEMENT N/A 12/14/2019   Procedure: INSERTION PORT-A-CATH WITH ULTRASOUND GUIDANCE;  Surgeon: Donnie Mesa, MD;  Location: [place];  Service: General;  Laterality: N/A;  . TUBAL LIGATION      Allergies: Patient has no known allergies.  Medications: Prior to Admission medications   Medication Sig Start Date End Date Taking? Authorizing Provider  ferrous sulfate 325 (65 FE) MG tablet Take 325 mg by mouth daily with breakfast.   Yes [provider]  hydrochlorothiazide (HYDRODIURIL) 25 MG tablet Take 25 mg by mouth daily.   Yes [provider]  HYDROcodone-acetaminophen (NORCO/VICODIN) 5-325 MG tablet Take 1 tablet by mouth every 6 (six) hours as needed for moderate pain. 12/14/19  Yes Donnie Mesa, MD  HYDROmorphone (DILAUDID) 2 MG tablet Take 1 tablet (2 mg total) by mouth every 4 (four) hours as needed for severe pain. 12/25/19  Yes [name], MD  ibuprofen (ADVIL,MOTRIN) 800 MG tablet Take 800 mg by mouth every 8 (eight) hours as needed for moderate pain.    Yes [provider]  losartan (COZAAR) 50 MG tablet Take 50 mg by mouth daily.   Yes [provider]  metFORMIN (GLUCOPHAGE) 500 MG tablet Take 500 mg by mouth daily with breakfast.    Yes [provider]  oxybutynin (DITROPAN) 5 MG tablet Take 5 mg by mouth daily.  10/02/19  Yes  [provider]  potassium chloride (MICRO-K) 10 MEQ CR capsule Take 10 mEq by mouth daily.  02/14/18  Yes [provider]  pravastatin (PRAVACHOL) 10 MG tablet Take 10 mg by mouth daily.    Yes [provider]  lidocaine-prilocaine (EMLA)  cream Apply a small amount to port a cath site and cover with plastic wrap 1 hour prior to chemotherapy appointments 12/25/19   [name], MD  PACLitaxel (TAXOL IV) Inject 80 mg/m2 into the vein once a week. Day 1, 8, 15 every 28 days 01/07/20   [provider]  prochlorperazine (COMPAZINE) 10 MG tablet Take 1 tablet (10 mg total) by mouth every 6 (six) hours as needed (Nausea or vomiting). 12/25/19   [name], MD     Family History  Problem Relation Age of Onset  . Diabetes Mellitus II Father   . Cancer Mother        Deceased at age 64; patient not sure if cancer was in her stomach or colon    Social History   Socioeconomic History  . Marital status: Married    Spouse name: Herbie [place]  . Number of children: 2  . Years of education: Not on file  . Highest education level: Not on file  Occupational History  . Occupation: retired  Tobacco Use  . Smoking status: Former Smoker    Types: Cigarettes  . Smokeless tobacco: Never Used  . Tobacco comment: quit about 40+ years ago  Vaping Use  . Vaping Use: Never used  Substance and Sexual Activity  . Alcohol use: Never  . Drug use: No  . Sexual activity: Not on file  Other Topics Concern  . Not on file  Social History Narrative  . Not on file   Social Determinants of Health   Financial Resource Strain: Low Risk   . Difficulty of Paying Living Expenses: Not hard at all  Food Insecurity: No Food Insecurity  . Worried About Charity fundraiser in the Last Year: Never true  . Ran Out of Food in the Last Year: Never true  Transportation Needs: No Transportation Needs  . Lack of Transportation (Medical): No  . Lack of Transportation (Non-Medical): No  Physical Activity: Inactive  . Days of Exercise per Week: 0 days  . Minutes of Exercise per Session: 0 min  Stress: No Stress Concern Present  . Feeling of Stress : Only a little  Social Connections: Moderately Integrated  . Frequency of Communication with  Friends and Family: Three times a week  . Frequency of Social Gatherings with Friends and Family: Three times a week  . Attends Religious Services: 1 to 4 times per year  . Active Member of Clubs or Organizations: No  . Attends Archivist Meetings: Never  . Marital Status: Married    Review of Systems: A 12 point ROS discussed and pertinent positives are indicated in the HPI above.  All other systems are negative.  Review of Systems  Constitutional: Negative for appetite change and fever.  Respiratory: Negative for cough and shortness of breath.   Cardiovascular: Negative for chest pain and leg swelling.  Gastrointestinal: Negative for abdominal pain, diarrhea, nausea and vomiting.  Musculoskeletal: Positive for back pain.  Skin:       Recent port-a-cath placement, right upper chest.  Left breast with swelling and skin changes.   Neurological: Positive for weakness. Negative for headaches.       Left arm/hand weakness secondary to left breast cancer  Vital Signs: BP (!) 134/111   Pulse 91   Temp 97.8 F (36.6 C) (Oral)   Resp 16   Ht 5\' 3"  (1.6 m)   Wt (!) 290 lb (131.5 kg)   SpO2 99%   BMI 51.37 kg/m   Physical Exam Constitutional:      General: She is not in acute distress.    Appearance: She is obese.  HENT:     Mouth/Throat:     Mouth: Mucous membranes are moist.     Pharynx: Oropharynx is clear.  Cardiovascular:     Rate and Rhythm: Normal rate and regular rhythm.     Pulses: Normal pulses.     Heart sounds: Normal heart sounds.  Pulmonary:     Effort: Pulmonary effort is normal.     Breath sounds: Normal breath sounds.  Abdominal:     General: Bowel sounds are normal. There is no distension.     Palpations: Abdomen is soft.     Tenderness: There is no abdominal tenderness.  Musculoskeletal:     Comments: Patient with poor left hand/arm coordination and weakness secondary to left breast cancer.   Skin:    Comments: Left outer breast with peau  d'orange skin changes, swelling and tenderness.   Neurological:     Mental Status: She is alert and oriented to person, place, and time.     Imaging: CT Chest W Contrast  Result Date: 12/12/2019 CLINICAL DATA:  Newly diagnosed left breast carcinoma.  Staging. EXAM: CT CHEST, ABDOMEN, AND PELVIS WITH CONTRAST TECHNIQUE: Multidetector CT imaging of the chest, abdomen and pelvis was performed following the standard protocol during bolus administration of intravenous contrast. CONTRAST:  175mL OMNIPAQUE IOHEXOL 300 MG/ML  SOLN COMPARISON:  [facility] only CT on 03/15/2014 FINDINGS: CT CHEST FINDINGS Cardiovascular: No acute findings. Mediastinum/Lymph Nodes: A large mass is seen in the axillary tail of the left breast measuring 6.3 x 6.1 cm, likely representing primary breast carcinoma. Diffuse left breast skin thickening and soft tissue stranding is also demonstrated, consistent with carcinoma. Bulky lymphadenopathy is seen within the left subpectoral and axillary regions, with largest lymph node measuring 3.3 cm on image 12/2. Mild mediastinal lymphadenopathy is seen in the right paratracheal pre-vascular, and lateral aortic regions, with largest lymph node in the lateral aortic region measuring 2.4 cm on image 17/2. lymphadenopathy is seen in the lower neck in the inferior jugular and supraclavicular regions, left side greater than right. Mild lymphadenopathy is also seen in the right axilla, with largest lymph node measuring 11 mm on image 14/2. Lungs/Pleura: No pulmonary infiltrate or mass identified. No effusion present. Musculoskeletal:  No suspicious bone lesions identified. CT ABDOMEN AND PELVIS FINDINGS Hepatobiliary: Multiple small hypovascular lesions are seen in the right and left lobes which are new since previous study and consistent with liver metastases. Index lesion in the medial dome of the right hepatic lobe measures 1.7 x 1.3 cm on image 35/2. Gallbladder is unremarkable. No evidence of biliary  ductal dilatation. Pancreas:  No mass or inflammatory changes. Spleen:  Within normal limits in size and appearance. Adrenals/Urinary tract: Normal adrenal glands. A few small bilateral renal cysts are noted. No masses or hydronephrosis. Stomach/Bowel: An annular constricting mass is seen involving the ascending colon which measures 3.6 x 3.5 cm (image 57/2), highly suspicious for primary colon carcinoma. Small less than 1 cm right pericolonic lymph nodes are consistent with local lymph node metastases. No evidence of bowel obstruction. Vascular/Lymphatic: No pathologically enlarged lymph nodes identified.  No abdominal aortic aneurysm. Reproductive: Several partially calcified uterine fibroids are again seen, largest measuring approximately 4 cm. Adnexal regions are unremarkable. Other:  None. Musculoskeletal:  No suspicious bone lesions identified. IMPRESSION: 6.3 cm mass in axillary tail of left breast, with diffuse left breast skin thickening and soft tissue stranding, consistent with primary breast carcinoma. Bulky left subpectoral, axillary, and mediastinal lymphadenopathy, consistent with metastatic disease. Lymphadenopathy also seen in the lower neck, left side greater than right, and mildly enlarged lymph node in the right axilla. Multiple small liver metastases. Annular constricting mass in the ascending colon, highly suspicious for primary colon carcinoma. Adjacent sub-cm right pericolonic lymph nodes, consistent with local lymph node metastases. Colonoscopy is recommended. Uterine fibroids. Electronically Signed   By: Marlaine Hind M.D.   On: 12/12/2019 15:45   NM Bone Scan Whole Body  Result Date: 12/12/2019 CLINICAL DATA:  Breast cancer.  New diagnosis.  Staging. EXAM: NUCLEAR MEDICINE WHOLE BODY BONE SCAN TECHNIQUE: Whole body anterior and posterior images were obtained approximately 3 hours after intravenous injection of radiopharmaceutical. RADIOPHARMACEUTICALS:  21.0 mCi Technetium-74m MDP IV  COMPARISON:  CT 11/12/2019 FINDINGS: No foci of radiotracer within the axillary or appendicular skeleton to localize breast cancer metastasis. Uptake in the proximal LEFT femur is favored related to severe degenerative change seen on comparison CT. Degenerative uptake noted in the knees. Degenerate uptake noted in the SI joints. Inflammatory change of the LEFT breast. IMPRESSION: No evidence skeletal metastasis. Electronically Signed   By: Suzy Bouchard M.D.   On: 12/12/2019 17:16   CT Abdomen Pelvis W Contrast  Result Date: 12/12/2019 CLINICAL DATA:  Newly diagnosed left breast carcinoma.  Staging. EXAM: CT CHEST, ABDOMEN, AND PELVIS WITH CONTRAST TECHNIQUE: Multidetector CT imaging of the chest, abdomen and pelvis was performed following the standard protocol during bolus administration of intravenous contrast. CONTRAST:  117mL OMNIPAQUE IOHEXOL 300 MG/ML  SOLN COMPARISON:  [facility] only CT on 03/15/2014 FINDINGS: CT CHEST FINDINGS Cardiovascular: No acute findings. Mediastinum/Lymph Nodes: A large mass is seen in the axillary tail of the left breast measuring 6.3 x 6.1 cm, likely representing primary breast carcinoma. Diffuse left breast skin thickening and soft tissue stranding is also demonstrated, consistent with carcinoma. Bulky lymphadenopathy is seen within the left subpectoral and axillary regions, with largest lymph node measuring 3.3 cm on image 12/2. Mild mediastinal lymphadenopathy is seen in the right paratracheal pre-vascular, and lateral aortic regions, with largest lymph node in the lateral aortic region measuring 2.4 cm on image 17/2. lymphadenopathy is seen in the lower neck in the inferior jugular and supraclavicular regions, left side greater than right. Mild lymphadenopathy is also seen in the right axilla, with largest lymph node measuring 11 mm on image 14/2. Lungs/Pleura: No pulmonary infiltrate or mass identified. No effusion present. Musculoskeletal:  No suspicious bone lesions  identified. CT ABDOMEN AND PELVIS FINDINGS Hepatobiliary: Multiple small hypovascular lesions are seen in the right and left lobes which are new since previous study and consistent with liver metastases. Index lesion in the medial dome of the right hepatic lobe measures 1.7 x 1.3 cm on image 35/2. Gallbladder is unremarkable. No evidence of biliary ductal dilatation. Pancreas:  No mass or inflammatory changes. Spleen:  Within normal limits in size and appearance. Adrenals/Urinary tract: Normal adrenal glands. A few small bilateral renal cysts are noted. No masses or hydronephrosis. Stomach/Bowel: An annular constricting mass is seen involving the ascending colon which measures 3.6 x 3.5 cm (image 57/2), highly suspicious for primary colon  carcinoma. Small less than 1 cm right pericolonic lymph nodes are consistent with local lymph node metastases. No evidence of bowel obstruction. Vascular/Lymphatic: No pathologically enlarged lymph nodes identified. No abdominal aortic aneurysm. Reproductive: Several partially calcified uterine fibroids are again seen, largest measuring approximately 4 cm. Adnexal regions are unremarkable. Other:  None. Musculoskeletal:  No suspicious bone lesions identified. IMPRESSION: 6.3 cm mass in axillary tail of left breast, with diffuse left breast skin thickening and soft tissue stranding, consistent with primary breast carcinoma. Bulky left subpectoral, axillary, and mediastinal lymphadenopathy, consistent with metastatic disease. Lymphadenopathy also seen in the lower neck, left side greater than right, and mildly enlarged lymph node in the right axilla. Multiple small liver metastases. Annular constricting mass in the ascending colon, highly suspicious for primary colon carcinoma. Adjacent sub-cm right pericolonic lymph nodes, consistent with local lymph node metastases. Colonoscopy is recommended. Uterine fibroids. Electronically Signed   By: Marlaine Hind M.D.   On: 12/12/2019 15:45    MR BREAST BILATERAL W WO CONTRAST INC CAD  Result Date: 12/10/2019 CLINICAL DATA:  69 year old female with newly diagnosed UPPER OUTER LEFT breast invasive ductal carcinoma with metastases in LEFT axillary and RIGHT axillary lymph nodes. LABS:  None performed today EXAM: BILATERAL BREAST MRI WITH AND WITHOUT CONTRAST TECHNIQUE: Multiplanar, multisequence MR images of both breasts were obtained prior to and following the intravenous administration of 10 ml of Gadavist Three-dimensional MR images were rendered by post-processing of the original MR data on an independent workstation. The three-dimensional MR images were interpreted, and findings are reported in the following complete MRI report for this study. Three dimensional images were evaluated at the independent DynaCad workstation COMPARISON:  Prior mammograms and ultrasounds FINDINGS: Please note that this study is slightly limited technically due to loss of signal posteriorly and some motion artifact. Breast composition: b. Scattered fibroglandular tissue. Background parenchymal enhancement: Mild Right breast: There is mild skin thickening along the very far MEDIAL RIGHT breast which appears to extend from the LEFT side. No other mass or suspicious enhancing abnormality noted within the RIGHT breast. Left breast: A irregular mass containing biopsy clip artifact is identified within the far posterior OUTER RIGHT breast, at least 5.2 x 8.5 cm, but difficult to accurately measure due to far posterior location and adjacent abnormal lymph nodes. This mass abuts the pectoralis musculature. Diffuse skin thickening of the entire LEFT breast is identified with patchy areas of dermal enhancement. Suspicious patchy and stippled nonmasslike enhancement within all 4 quadrants of the LEFT breast identified. This abnormal nonmasslike enhancement extends just across the midline in the presternal fat to the RIGHT and abuts the LEFT pectoralis muscle. Lymph nodes:  Markedly enlarged LEFT axillary lymph nodes are noted with at least 7 abnormal appearing LEFT axillary lymph nodes. A single enlarged abnormal RIGHT axillary lymph node with biopsy clip artifact is identified. At least 2 enlarged internal mammary lymph nodes on each side are noted. Ancillary findings:  None. IMPRESSION: 1. Multi centric LEFT breast malignancy involving all 4 quadrants of and skin of the LEFT breast, including a 0.5 cm biopsy-proven malignancy within the far posterior OUTER RIGHT breast,, abnormal highly suspicious non masslike enhancement within all 4 quadrants of the LEFT breast and enhancing diffuse LEFT breast skin thickening. This abnormal non masslike enhancement and skin thickening extends across the midline to the far MEDIAL RIGHT breast. Biopsy-proven malignancy and abnormal non masslike enhancement also abuts the LEFT pectoralis musculature. At least 7 abnormal LEFT axillary lymph nodes and 1 abnormal  RIGHT axillary lymph node, compatible with metastatic disease to axillary lymph nodes. RECOMMENDATION: Treatment plan. BI-RADS CATEGORY  6: Known biopsy-proven malignancy. Electronically Signed   By: Margarette Canada M.D.   On: 12/10/2019 12:17   DG CHEST PORT 1 VIEW  Result Date: 12/14/2019 CLINICAL DATA:  Port-A-Cath placement.  Metastatic breast cancer. EXAM: PORTABLE CHEST 1 VIEW COMPARISON:  CT 12/12/2019.  Radiographs 09/30/2013. FINDINGS: 0846 hours. New right IJ Port-A-Cath extends to the level of the mid right atrium. The heart size and mediastinal contours are stable with aortic atherosclerosis. There is chronic elevation of the right hemidiaphragm with mildly increased bibasilar atelectasis. No pneumothorax, edema or significant pleural effusion. IMPRESSION: New right IJ Port-A-Cath placement with mildly increased bibasilar atelectasis. No pneumothorax. Electronically Signed   By: Richardean Sale M.D.   On: 12/14/2019 09:15   DG Fluoro Guide CV Line-No Report  Result Date:  12/14/2019 Fluoroscopy was utilized by the requesting physician.  No radiographic interpretation.    Labs:  CBC: Recent Labs    12/11/19 1435  WBC 6.5  HGB 12.2  HCT 38.1  PLT 287    COAGS: No results for input(s): INR, APTT in the last 8760 hours.  BMP: Recent Labs    12/10/19 0825  NA 136  K 4.0  CL 101  CO2 25  GLUCOSE 119*  BUN 16  CALCIUM 9.0  CREATININE 0.68  GFRNONAA >60  GFRAA >60    LIVER FUNCTION TESTS: Recent Labs    12/10/19 0825  BILITOT 0.5  AST 31  ALT 18  ALKPHOS 73  PROT 7.8  ALBUMIN 3.4*    TUMOR MARKERS: No results for input(s): AFPTM, CEA, CA199, CHROMGRNA in the last 8760 hours.  Assessment and Plan:  Left breast cancer with suspected metastasis: Crystal Vega. Zenia Resides, 69 year old female, presents today to the [facility] department for an image-guided liver lesion biopsy. This procedure has been approved by Dr. Anselm Pancoast.   Risks and benefits of a liver lesion biopsy were discussed with the patient including, but not limited to bleeding, infection, damage to adjacent structures or low yield requiring additional tests.  All of the questions were answered and there is agreement to proceed.  She has been NPO. She does not take any blood thinning medications. Vitals have been reviewed. Labs are still pending but will be reviewed prior to the start of the procedure.   Consent signed and in chart.   Thank you for this interesting consult.  I greatly enjoyed meeting KEELYN MONJARAS and look forward to participating in their care.  A copy of this report was sent to the requesting provider on this date.  Electronically Signed: Soyla Dryer, AGACNP-BC [phone] 12/28/2019, 7:39 AM   I spent a total of  30 Minutes   in face to face in clinical consultation, greater than 50% of which was counseling/coordinating care for image-guided liver lesion biopsy.

## 2019-12-28 NOTE — ED Triage Notes (Addendum)
Pt states she had a liver biopsy Friday morning at [facility].  Pt states this evening she started having pain at the site where they took the biopsy, right side.   Pt states pain is worse when she takes a deep breath.

## 2019-12-28 NOTE — Procedures (Signed)
Pre Procedure Dx: Liver lesion Post Procedural Dx: Same  Technically successful US guided biopsy of indeterminate lesion within the subcapsular aspect of the right lobe of the liver.  EBL: None No immediate complications.   Ronny Bacon, MD Pager #: [phone]

## 2019-12-28 NOTE — Discharge Instructions (Addendum)
Liver Biopsy, Care After These instructions give you information on caring for yourself after your procedure. Your doctor may also give you more specific instructions. Call your doctor if you have any problems or questions after your procedure. What can I expect after the procedure? After the procedure, it is common to have:  Pain and soreness where the biopsy was done.  Bruising around the area where the biopsy was done.  Sleepiness and be tired for a few days. Follow these instructions at home: Medicines  Take over-the-counter and prescription medicines only as told by your doctor.  If you were prescribed an antibiotic medicine, take it as told by your doctor. Do not stop taking the antibiotic even if you start to feel better.  Do not take medicines such as aspirin and ibuprofen. These medicines can thin your blood. Do not take these medicines unless your doctor tells you to take them.  If you are taking prescription pain medicine, take actions to prevent or treat constipation. Your doctor may recommend that you: ? Drink enough fluid to keep your pee (urine) clear or pale yellow. ? Take over-the-counter or prescription medicines. ? Eat foods that are high in fiber, such as fresh fruits and vegetables, whole grains, and beans. ? Limit foods that are high in fat and processed sugars, such as fried and sweet foods. Caring for your cut  Follow instructions from your doctor about how to take care of your cuts from surgery (incisions). Make sure you: ? Wash your hands with soap and water before you change your bandage (dressing). If you cannot use soap and water, use hand sanitizer. ? Remove your bandage as told by your doctor. In 24 hours  Check your cuts every day for signs of infection. Check for: ? Redness, swelling, or more pain. ? Fluid or blood. ? Pus or a bad smell. ? Warmth.  Do not take baths, swim, or use a hot tub until your doctor says it is okay to do  so. Activity   Rest at home for 1-2 days or as told by your doctor. ? Avoid sitting for a long time without moving. Get up to take short walks every 1-2 hours.  Return to your normal activities as told by your doctor. Ask what activities are safe for you.  Do not do these things in the first 24 hours: ? Drive. ? Use machinery. ? Take a bath or shower. Once you remove bandaid, you may shower  Do not lift more than 10 pounds (4.5 kg) or play contact sports for the first 2 weeks. General instructions   Do not drink alcohol in the first week after the procedure.  Have someone stay with you for at least 24 hours after the procedure.  Get your test results. Ask your doctor or the department that is doing the test: ? When will my results be ready? ? How will I get my results? ? What are my treatment options? ? What other tests do I need? ? What are my next steps?  Keep all follow-up visits as told by your doctor. This is important. Contact a doctor if:  A cut bleeds and leaves more than just a small spot of blood.  A cut is red, puffs up (swells), or hurts more than before.  Fluid or something else comes from a cut.  A cut smells bad.  You have a fever or chills. Get help right away if:  You have swelling, bloating, or pain in your belly (  abdomen).  You get dizzy or faint.  You have a rash.  You feel sick to your stomach (nauseous) or throw up (vomit).  You have trouble breathing, feel short of breath, or feel faint.  Your chest hurts.  You have problems talking or seeing.  You have trouble with your balance or moving your arms or legs. Summary  After the procedure, it is common to have pain, soreness, bruising, and tiredness.  Your doctor will tell you how to take care of yourself at home. Change your bandage, take your medicines, and limit your activities as told by your doctor.  Call your doctor if you have symptoms of infection. Get help right away if your  belly swells, your cut bleeds a lot, or you have trouble talking or breathing. This information is not intended to replace advice given to you by your health care provider. Make sure you discuss any questions you have with your health care provider. Document Revised: 05/27/2017 Document Reviewed: 05/27/2017 Elsevier Patient Education  [place].  Moderate Conscious Sedation, Adult, Care After These instructions provide you with information about caring for yourself after your procedure. Your health care provider may also give you more specific instructions. Your treatment has been planned according to current medical practices, but problems sometimes occur. Call your health care provider if you have any problems or questions after your procedure. What can I expect after the procedure? After your procedure, it is common:  To feel sleepy for several hours.  To feel clumsy and have poor balance for several hours.  To have poor judgment for several hours.  To vomit if you eat too soon. Follow these instructions at home: For at least 24 hours after the procedure:   Do not: ? Participate in activities where you could fall or become injured. ? Drive. ? Use heavy machinery. ? Drink alcohol. ? Take sleeping pills or medicines that cause drowsiness. ? Make important decisions or sign legal documents. ? Take care of children on your own.  Rest. Eating and drinking  Follow the diet recommended by your health care provider.  If you vomit: ? Drink water, juice, or soup when you can drink without vomiting. ? Make sure you have little or no nausea before eating solid foods. General instructions  Have a responsible adult stay with you until you are awake and alert.  Take over-the-counter and prescription medicines only as told by your health care provider.  If you smoke, do not smoke without supervision.  Keep all follow-up visits as told by your health care provider. This is  important. Contact a health care provider if:  You keep feeling nauseous or you keep vomiting.  You feel light-headed.  You develop a rash.  You have a fever. Get help right away if:  You have trouble breathing. This information is not intended to replace advice given to you by your health care provider. Make sure you discuss any questions you have with your health care provider. Document Revised: 04/29/2017 Document Reviewed: 09/06/2015 Elsevier Patient Education  2020 [employer].

## 2019-12-29 ENCOUNTER — Emergency Department (HOSPITAL_COMMUNITY): Payer: Medicare HMO

## 2019-12-29 ENCOUNTER — Emergency Department (HOSPITAL_COMMUNITY)
Admission: EM | Admit: 2019-12-29 | Discharge: 2019-12-29 | Disposition: A | Discharge status: Home / Self Care | Payer: Medicare HMO | Attending: Emergency Medicine | Admitting: Emergency Medicine

## 2019-12-29 DIAGNOSIS — R509 Fever, unspecified: Secondary | ICD-10-CM

## 2019-12-29 DIAGNOSIS — N390 Urinary tract infection, site not specified: Secondary | ICD-10-CM

## 2019-12-29 LAB — CBC WITH DIFFERENTIAL/PLATELET
Abs Immature Granulocytes: 0.04 10*3/uL (ref 0.00–0.07)
Basophils Absolute: 0 10*3/uL (ref 0.0–0.1)
Basophils Relative: 0 %
Eosinophils Absolute: 0 10*3/uL (ref 0.0–0.5)
Eosinophils Relative: 0 %
HCT: 37.7 % (ref 36.0–46.0)
Hemoglobin: 12.2 g/dL (ref 12.0–15.0)
Immature Granulocytes: 0 %
Lymphocytes Relative: 9 %
Lymphs Abs: 1.2 10*3/uL (ref 0.7–4.0)
MCH: 27.5 pg (ref 26.0–34.0)
MCHC: 32.4 g/dL (ref 30.0–36.0)
MCV: 84.9 fL (ref 80.0–100.0)
Monocytes Absolute: 0.9 10*3/uL (ref 0.1–1.0)
Monocytes Relative: 7 %
Neutro Abs: 11 10*3/uL — ABNORMAL HIGH (ref 1.7–7.7)
Neutrophils Relative %: 84 %
Platelets: 239 10*3/uL (ref 150–400)
RBC: 4.44 MIL/uL (ref 3.87–5.11)
RDW: 13.6 % (ref 11.5–15.5)
WBC: 13.2 10*3/uL — ABNORMAL HIGH (ref 4.0–10.5)
nRBC: 0 % (ref 0.0–0.2)

## 2019-12-29 LAB — COMPREHENSIVE METABOLIC PANEL
ALT: 21 U/L (ref 0–44)
AST: 37 U/L (ref 15–41)
Albumin: 3.7 g/dL (ref 3.5–5.0)
Alkaline Phosphatase: 76 U/L (ref 38–126)
Anion gap: 10 (ref 5–15)
BUN: 18 mg/dL (ref 8–23)
CO2: 24 mmol/L (ref 22–32)
Calcium: 8.6 mg/dL — ABNORMAL LOW (ref 8.9–10.3)
Chloride: 100 mmol/L (ref 98–111)
Creatinine, Ser: 0.79 mg/dL (ref 0.44–1.00)
GFR calc Af Amer: >60 mL/min (ref >60)
GFR calc non Af Amer: >60 mL/min (ref >60)
Glucose, Bld: 170 mg/dL — ABNORMAL HIGH (ref 70–99)
Potassium: 3.8 mmol/L (ref 3.5–5.1)
Sodium: 134 mmol/L — ABNORMAL LOW (ref 135–145)
Total Bilirubin: 0.6 mg/dL (ref 0.3–1.2)
Total Protein: 8.2 g/dL — ABNORMAL HIGH (ref 6.5–8.1)

## 2019-12-29 LAB — URINALYSIS, ROUTINE W REFLEX MICROSCOPIC
Bacteria, UA: NONE SEEN
Bilirubin Urine: NEGATIVE
Glucose, UA: NEGATIVE mg/dL
Hgb urine dipstick: NEGATIVE
Ketones, ur: 5 mg/dL — AB
Leukocytes,Ua: NEGATIVE
Nitrite: POSITIVE — AB
Protein, ur: NEGATIVE mg/dL
Specific Gravity, Urine: 1.032 — ABNORMAL HIGH (ref 1.005–1.030)
pH: 6 (ref 5.0–8.0)

## 2019-12-29 LAB — SARS CORONAVIRUS 2 BY RT PCR (HOSPITAL ORDER, PERFORMED IN ~~LOC~~ HOSPITAL LAB): SARS Coronavirus 2: NEGATIVE

## 2019-12-29 LAB — LACTIC ACID, PLASMA: Lactic Acid, Venous: 1 mmol/L (ref 0.5–1.9)

## 2019-12-29 LAB — LIPASE, BLOOD: Lipase: 26 U/L (ref 11–51)

## 2019-12-29 MED ORDER — CEPHALEXIN 500 MG PO CAPS
500.0000 mg | ORAL_CAPSULE | Freq: Once | ORAL | Status: AC
  Administered 2019-12-29: 500 mg via ORAL
  Filled 2019-12-29: qty 1

## 2019-12-29 MED ORDER — CEPHALEXIN 500 MG PO CAPS
500.0000 mg | ORAL_CAPSULE | Freq: Three times a day (TID) | ORAL | 0 refills | Status: DC
Start: 2019-12-29 — End: 2020-01-14

## 2019-12-29 MED ORDER — IOHEXOL 300 MG/ML  SOLN
100.0000 mL | Freq: Once | INTRAMUSCULAR | Status: AC | PRN
  Administered 2019-12-29: 100 mL via INTRAVENOUS

## 2019-12-29 MED ORDER — SODIUM CHLORIDE 0.9 % IV BOLUS
1000.0000 mL | Freq: Once | INTRAVENOUS | Status: AC
  Administered 2019-12-29: 1000 mL via INTRAVENOUS

## 2019-12-29 NOTE — ED Provider Notes (Signed)
[facility] Provider Note   CSN: [id number] Arrival date & time: 12/28/19  2323   Time seen 12:50 AM  History Chief Complaint  Patient presents with  . Incisional Pain    Crystal Vega is a 69 y.o. female.  HPI Patient states she had a liver biopsy done at 8 AM this morning by the interventional radiology department at [facility].  She states about 7 PM she started having pain over the incision site with breathing.  She states the pain is not there constantly.  She denies chills and was unaware she was having a fever.  She denies nausea, vomiting, diarrhea, bleeding from the site, cough, sore throat or rhinorrhea.  She states her husband has been checking the site did not see any bleeding and he is the one who took her temperature and found out she had a fever.   PCP Lemmie Evens, MD     Past Medical History:  Diagnosis Date  . Anemia   . Arthritis    per patient " left knee"  . Essential hypertension, benign   . Metastatic breast cancer ([place])    left breast  . Port-A-Cath in place 12/25/2019  . Type 2 diabetes mellitus [facility])     Patient Active Problem List   Diagnosis Date Noted  . Port-A-Cath in place 12/25/2019  . Colonic mass 12/19/2019  . Goals of care, counseling/discussion 12/17/2019  . Malignant neoplasm of overlapping sites of left breast in female, estrogen receptor positive ([place]) 11/28/2019  . 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 Heme positive stool 02/27/2018  . FH: colon cancer 02/27/2018  . Constipation 02/27/2018  . Simple endometrial 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 04/16/2014  . Atypical chest pain 10/10/2013  . Essential hypertension, benign 10/10/2013  . Morbid obesity ([place]) 10/10/2013  . Type 2 diabetes mellitus ([place]) 10/10/2013    Past Surgical History:  Procedure Laterality Date  . CATARACT EXTRACTION W/ INTRAOCULAR LENS IMPLANT Right   . HYSTEROSCOPY WITH D & C N/A 06/05/2014    Procedure: DILATATION AND CURETTAGE /HYSTEROSCOPY;  Surgeon: Florian Buff, MD;  Location: [facility];  Service: Gynecology;  Laterality: N/A;  . POLYPECTOMY N/A 06/05/2014   Procedure: ENDOMETRIAL POLYPECTOMY;  Surgeon: Florian Buff, MD;  Location: [facility];  Service: Gynecology;  Laterality: N/A;  . PORTACATH PLACEMENT N/A 12/14/2019   Procedure: INSERTION PORT-A-CATH WITH ULTRASOUND GUIDANCE;  Surgeon: Donnie Mesa, MD;  Location: [facility];  Service: General;  Laterality: N/A;  . TUBAL LIGATION       OB History   No obstetric history on file.     Family History  Problem Relation Age of Onset  . Diabetes Mellitus II Father   . Cancer Mother        Deceased at age 30; patient not sure if cancer was in her stomach or colon    Social History   Tobacco Use  . Smoking status: Former Smoker    Types: Cigarettes  . Smokeless tobacco: Never Used  . Tobacco comment: quit about 40+ years ago  Vaping Use  . Vaping Use: Never used  Substance Use Topics  . Alcohol use: Never  . Drug use: No    Home Medications Prior to Admission medications   Medication Sig Start Date End Date Taking? Authorizing Provider  cephALEXin (KEFLEX) 500 MG capsule Take 1 capsule (500 mg total) by mouth 3 (three) times daily. 12/29/19   Rolland Porter, MD  ferrous sulfate  325 (65 FE) MG tablet Take 325 mg by mouth daily with breakfast.    [provider]  hydrochlorothiazide (HYDRODIURIL) 25 MG tablet Take 25 mg by mouth daily.    [provider]  HYDROcodone-acetaminophen (NORCO/VICODIN) 5-325 MG tablet Take 1 tablet by mouth every 6 (six) hours as needed for moderate pain. 12/14/19   Donnie Mesa, MD  HYDROmorphone (DILAUDID) 2 MG tablet Take 1 tablet (2 mg total) by mouth every 4 (four) hours as needed for severe pain. 12/25/19   [name], MD  ibuprofen (ADVIL,MOTRIN) 800 MG tablet Take 800 mg by mouth every 8 (eight) hours as needed for moderate pain.     [provider]    lidocaine-prilocaine (EMLA) cream Apply a small amount to port a cath site and cover with plastic wrap 1 hour prior to chemotherapy appointments 12/25/19   [name], MD  losartan (COZAAR) 50 MG tablet Take 50 mg by mouth daily.    [provider]  metFORMIN (GLUCOPHAGE) 500 MG tablet Take 500 mg by mouth daily with breakfast.     [provider]  oxybutynin (DITROPAN) 5 MG tablet Take 5 mg by mouth daily.  10/02/19   [provider]  PACLitaxel (TAXOL IV) Inject 80 mg/m2 into the vein once a week. Day 1, 8, 15 every 28 days 01/07/20   [provider]  potassium chloride (MICRO-K) 10 MEQ CR capsule Take 10 mEq by mouth daily.  02/14/18   [provider]  pravastatin (PRAVACHOL) 10 MG tablet Take 10 mg by mouth daily.     [provider]  prochlorperazine (COMPAZINE) 10 MG tablet Take 1 tablet (10 mg total) by mouth every 6 (six) hours as needed (Nausea or vomiting). 12/25/19   [name], MD    Allergies    Patient has no known allergies.  Review of Systems   Review of Systems  All other systems reviewed and are negative.   Physical Exam Updated Vital Signs BP (!) 157/87 (BP Location: Right Arm)   Pulse (!) 112   Temp 99.8 F (37.7 C) (Oral)   Resp 16   Ht 5\' 3"  (1.6 m)   Wt (!) 131.5 kg   SpO2 95%   BMI 51.37 kg/m   Physical Exam Vitals and nursing note reviewed.  Constitutional:      Appearance: Normal appearance. She is obese.  HENT:     Head: Normocephalic and atraumatic.     Right Ear: External ear normal.     Left Ear: External ear normal.  Eyes:     Extraocular Movements: Extraocular movements intact.     Conjunctiva/sclera: Conjunctivae normal.  Cardiovascular:     Rate and Rhythm: Regular rhythm. Tachycardia present.  Pulmonary:     Effort: Pulmonary effort is normal. No respiratory distress.     Breath sounds: Normal breath sounds.  Abdominal:     Tenderness: There is abdominal tenderness.  There is no guarding or rebound.     Comments: Patient still has the Band-Aid in place in her right upper lateral abdomen without bleeding.  Musculoskeletal:        General: Normal range of motion.     Cervical back: Normal range of motion.  Skin:    General: Skin is warm and dry.     Comments: Patient skin is hot to touch  Neurological:     General: No focal deficit present.     Mental Status: She is alert and oriented to person, place, and  time.     Cranial Nerves: No cranial nerve deficit.  Psychiatric:        Mood and Affect: Mood normal.        Behavior: Behavior normal.        Thought Content: Thought content normal.     ED Results / Procedures / Treatments   Labs (all labs ordered are listed, but only abnormal results are displayed) Results for orders placed or performed during the hospital encounter of 12/29/19  Culture, blood (routine x 2)   Specimen: Right Antecubital; Blood  Result Value Ref Range   Specimen Description RIGHT ANTECUBITAL    Special Requests      BOTTLES DRAWN AEROBIC AND ANAEROBIC Blood Culture adequate volume Performed at [facility], [address] [place], [place] [address]    Culture PENDING    Report Status PENDING   Culture, blood (routine x 2)   Specimen: Left Antecubital; Blood  Result Value Ref Range   Specimen Description LEFT ANTECUBITAL    Special Requests      BOTTLES DRAWN AEROBIC AND ANAEROBIC Blood Culture adequate volume Performed at [facility], [address], [place], [place] [address]    Culture PENDING    Report Status PENDING   SARS Coronavirus 2 by RT PCR (hospital order, performed in [facility] hospital lab) Nasopharyngeal Nasopharyngeal Swab   Specimen: Nasopharyngeal Swab  Result Value Ref Range   SARS Coronavirus 2 NEGATIVE NEGATIVE  Comprehensive metabolic panel  Result Value Ref Range   Sodium 134 (L) 135 - 145 mmol/L   Potassium 3.8 3.5 - 5.1 mmol/L   Chloride 100 98 - 111 mmol/L   CO2 24 22 - 32 mmol/L     Glucose, Bld 170 (H) 70 - 99 mg/dL   BUN 18 8 - 23 mg/dL   Creatinine, Ser 0.79 0.44 - 1.00 mg/dL   Calcium 8.6 (L) 8.9 - 10.3 mg/dL   Total Protein 8.2 (H) 6.5 - 8.1 g/dL   Albumin 3.7 3.5 - 5.0 g/dL   AST 37 15 - 41 U/L   ALT 21 0 - 44 U/L   Alkaline Phosphatase 76 38 - 126 U/L   Total Bilirubin 0.6 0.3 - 1.2 mg/dL   GFR calc non Af Amer >60 >60 mL/min   GFR calc Af Amer >60 >60 mL/min   Anion gap 10 5 - 15  Lipase, blood  Result Value Ref Range   Lipase 26 11 - 51 U/L  CBC with Differential  Result Value Ref Range   WBC 13.2 (H) 4.0 - 10.5 K/uL   RBC 4.44 3.87 - 5.11 MIL/uL   Hemoglobin 12.2 12.0 - 15.0 g/dL   HCT 37.7 36 - 46 %   MCV 84.9 80.0 - 100.0 fL   MCH 27.5 26.0 - 34.0 pg   MCHC 32.4 30.0 - 36.0 g/dL   RDW 13.6 11.5 - 15.5 %   Platelets 239 150 - 400 K/uL   nRBC 0.0 0.0 - 0.2 %   Neutrophils Relative % 84 %   Neutro Abs 11.0 (H) 1.7 - 7.7 K/uL   Lymphocytes Relative 9 %   Lymphs Abs 1.2 0.7 - 4.0 K/uL   Monocytes Relative 7 %   Monocytes Absolute 0.9 0 - 1 K/uL   Eosinophils Relative 0 %   Eosinophils Absolute 0.0 0 - 0 K/uL   Basophils Relative 0 %   Basophils Absolute 0.0 0 - 0 K/uL   Immature Granulocytes 0 %   Abs Immature  Granulocytes 0.04 0.00 - 0.07 K/uL  Lactic acid, plasma  Result Value Ref Range   Lactic Acid, Venous 1.0 0.5 - 1.9 mmol/L  Urinalysis, Routine w reflex microscopic  Result Value Ref Range   Color, Urine AMBER (A) YELLOW   APPearance CLEAR CLEAR   Specific Gravity, Urine 1.032 (H) 1.005 - 1.030   pH 6.0 5.0 - 8.0   Glucose, UA NEGATIVE NEGATIVE mg/dL   Hgb urine dipstick NEGATIVE NEGATIVE   Bilirubin Urine NEGATIVE NEGATIVE   Ketones, ur 5 (A) NEGATIVE mg/dL   Protein, ur NEGATIVE NEGATIVE mg/dL   Nitrite POSITIVE (A) NEGATIVE   Leukocytes,Ua NEGATIVE NEGATIVE   RBC / HPF 0-5 0 - 5 RBC/hpf   WBC, UA 0-5 0 - 5 WBC/hpf   Bacteria, UA NONE SEEN NONE SEEN   Squamous Epithelial / LPF 0-5 0 - 5   Mucus PRESENT      Laboratory interpretation all normal except leukocytosis, hyperglycemia     EKG None  Radiology US BIOPSY (LIVER)  Result Date: 12/28/2019 INDICATION: History of breast cancer also with annular lesion involving the ascending colon worrisome colon cancer. Patient also found to have indeterminate liver lesions worrisome for metastatic disease and as such presents now for image guided liver lesion biopsy. EXAM: ULTRASOUND GUIDED LIVER LESION BIOPSY COMPARISON:  CT the chest, abdomen and pelvis-12/12/2019 MEDICATIONS: None ANESTHESIA/SEDATION: Fentanyl  mcg IV; Versed  mg IV Total Moderate Sedation time:   Minutes. The patient's level of consciousness and vital signs were monitored continuously by radiology nursing throughout the procedure under my direct supervision. COMPLICATIONS: None immediate. PROCEDURE: Informed written consent was obtained from the patient after a discussion of the risks, benefits and alternatives to treatment. The patient understands and consents the procedure. A timeout was performed prior to the initiation of the procedure. Ultrasound scanning was performed of the right upper abdominal quadrant demonstrates multiple hypoechoic nodules scattered throughout the right lobe of the liver. A dominant approximately 1.6 x 1.4 cm hypoechoic lesion within the subcapsular aspect of the right lobe of the liver was targeted for biopsy given lesion location and sonographic window. The procedure was planned. The right upper abdominal quadrant was prepped and draped in the usual sterile fashion. The overlying soft tissues were anesthetized with 1% lidocaine with epinephrine. A 17 gauge, 6.8 cm co-axial needle was advanced into a peripheral aspect of the lesion. This was followed by 6 core biopsies with an 18 gauge core device under direct ultrasound guidance. The coaxial needle tract was embolized with a small amount of Gel-Foam slurry and superficial hemostasis was obtained with manual  compression. Post procedural scanning was negative for definitive area of hemorrhage or additional complication. A dressing was placed. The patient tolerated the procedure well without immediate post procedural complication. IMPRESSION: Technically successful ultrasound guided core needle biopsy of indeterminate lesion within the right lobe of the liver. Electronically Signed   By: Sandi Mariscal M.D.   On: 12/28/2019 09:16    Procedures Procedures (including critical care time)  Medications Ordered in ED Medications  cephALEXin (KEFLEX) capsule 500 mg (has no administration in time range)  sodium chloride 0.9 % bolus 1,000 mL (0 mLs Intravenous Stopped 12/29/19 0337)  iohexol (OMNIPAQUE) 300 MG/ML solution 100 mL (100 mLs Intravenous Contrast Given 12/29/19 0157)    ED Course  I have reviewed the triage vital signs and the nursing notes.  Pertinent labs & imaging results that were available during my care of the patient were reviewed by me  and considered in my medical decision making (see chart for details).    MDM Rules/Calculators/A&P                         Laboratory work was done including blood cultures.  CT scan was done to make sure she did not have a abscess which would be extremely early considering she just had the biopsy done less than 24 hours ago, or a ruptured intestine or an incidental lung biopsy or pneumothorax.  Patient CT scans did not show anything new.  Patient's stay was prolonged due to her not providing a urine sample for prolonged period of time.  Her urine had positive nitrates but under the microscopic there were not significant white blood cells.  Her CT was suggestive of possible early pyelonephritis so she was started on oral Keflex.   Final Clinical Impression(s) / ED Diagnoses Final diagnoses:  Urinary tract infection without hematuria, site unspecified  Fever, unspecified fever cause    Rx / DC Orders ED Discharge Orders         Ordered     cephALEXin (KEFLEX) 500 MG capsule  3 times daily     Discontinue     12/29/19 0535         Plan discharge  Rolland Porter, MD, Barbette Or, MD 12/29/19 [phone]

## 2019-12-29 NOTE — Discharge Instructions (Addendum)
You can take acetaminophen 1000 mg plus ibuprofen 600 mg every 6 hours as needed for pain or fever.  Take antibiotics until gone.  Please let Dr. Delton Coombes know about your fever on Monday, August 2.  However if you feel worse over the weekend go ahead and call his service.  Return to the emergency department if you get vomiting or worsening symptoms.

## 2019-12-31 ENCOUNTER — Other Ambulatory Visit: Payer: Self-pay

## 2019-12-31 ENCOUNTER — Inpatient Hospital Stay (HOSPITAL_BASED_OUTPATIENT_CLINIC_OR_DEPARTMENT_OTHER): Payer: Medicare HMO | Admitting: Hematology

## 2019-12-31 ENCOUNTER — Inpatient Hospital Stay (HOSPITAL_COMMUNITY): Payer: Medicare HMO | Attending: Hematology

## 2019-12-31 ENCOUNTER — Inpatient Hospital Stay (HOSPITAL_COMMUNITY): Payer: Medicare HMO

## 2019-12-31 ENCOUNTER — Encounter (HOSPITAL_COMMUNITY): Payer: Self-pay

## 2019-12-31 VITALS — BP 116/69 | HR 74 | Temp 98.3°F | Resp 18

## 2019-12-31 DIAGNOSIS — C7981 Secondary malignant neoplasm of breast: Secondary | ICD-10-CM | POA: Diagnosis not present

## 2019-12-31 DIAGNOSIS — R0602 Shortness of breath: Secondary | ICD-10-CM | POA: Insufficient documentation

## 2019-12-31 DIAGNOSIS — Z170 Estrogen receptor positive status [ER+]: Secondary | ICD-10-CM

## 2019-12-31 DIAGNOSIS — Z5111 Encounter for antineoplastic chemotherapy: Secondary | ICD-10-CM | POA: Insufficient documentation

## 2019-12-31 DIAGNOSIS — C50812 Malignant neoplasm of overlapping sites of left female breast: Secondary | ICD-10-CM

## 2019-12-31 DIAGNOSIS — C773 Secondary and unspecified malignant neoplasm of axilla and upper limb lymph nodes: Secondary | ICD-10-CM | POA: Diagnosis present

## 2019-12-31 DIAGNOSIS — E119 Type 2 diabetes mellitus without complications: Secondary | ICD-10-CM | POA: Diagnosis not present

## 2019-12-31 DIAGNOSIS — K5900 Constipation, unspecified: Secondary | ICD-10-CM | POA: Insufficient documentation

## 2019-12-31 DIAGNOSIS — R079 Chest pain, unspecified: Secondary | ICD-10-CM | POA: Diagnosis not present

## 2019-12-31 DIAGNOSIS — Z95828 Presence of other vascular implants and grafts: Secondary | ICD-10-CM

## 2019-12-31 DIAGNOSIS — C787 Secondary malignant neoplasm of liver and intrahepatic bile duct: Secondary | ICD-10-CM | POA: Insufficient documentation

## 2019-12-31 DIAGNOSIS — Z87891 Personal history of nicotine dependence: Secondary | ICD-10-CM | POA: Insufficient documentation

## 2019-12-31 DIAGNOSIS — R5383 Other fatigue: Secondary | ICD-10-CM | POA: Insufficient documentation

## 2019-12-31 DIAGNOSIS — C182 Malignant neoplasm of ascending colon: Secondary | ICD-10-CM | POA: Diagnosis not present

## 2019-12-31 DIAGNOSIS — G893 Neoplasm related pain (acute) (chronic): Secondary | ICD-10-CM | POA: Diagnosis not present

## 2019-12-31 DIAGNOSIS — C50411 Malignant neoplasm of upper-outer quadrant of right female breast: Secondary | ICD-10-CM | POA: Diagnosis present

## 2019-12-31 DIAGNOSIS — I10 Essential (primary) hypertension: Secondary | ICD-10-CM | POA: Insufficient documentation

## 2019-12-31 LAB — URINE CULTURE: Culture: >=100000 — AB

## 2019-12-31 LAB — CBC WITH DIFFERENTIAL/PLATELET
Abs Immature Granulocytes: 0.03 10*3/uL (ref 0.00–0.07)
Basophils Absolute: 0.1 10*3/uL (ref 0.0–0.1)
Basophils Relative: 1 %
Eosinophils Absolute: 0.2 10*3/uL (ref 0.0–0.5)
Eosinophils Relative: 3 %
HCT: 34.6 % — ABNORMAL LOW (ref 36.0–46.0)
Hemoglobin: 11.1 g/dL — ABNORMAL LOW (ref 12.0–15.0)
Immature Granulocytes: 0 %
Lymphocytes Relative: 25 %
Lymphs Abs: 1.7 10*3/uL (ref 0.7–4.0)
MCH: 27.5 pg (ref 26.0–34.0)
MCHC: 32.1 g/dL (ref 30.0–36.0)
MCV: 85.9 fL (ref 80.0–100.0)
Monocytes Absolute: 0.8 10*3/uL (ref 0.1–1.0)
Monocytes Relative: 12 %
Neutro Abs: 4.1 10*3/uL (ref 1.7–7.7)
Neutrophils Relative %: 59 %
Platelets: 213 10*3/uL (ref 150–400)
RBC: 4.03 MIL/uL (ref 3.87–5.11)
RDW: 13.7 % (ref 11.5–15.5)
WBC: 6.8 10*3/uL (ref 4.0–10.5)
nRBC: 0 % (ref 0.0–0.2)

## 2019-12-31 LAB — COMPREHENSIVE METABOLIC PANEL
ALT: 17 U/L (ref 0–44)
AST: 28 U/L (ref 15–41)
Albumin: 3.1 g/dL — ABNORMAL LOW (ref 3.5–5.0)
Alkaline Phosphatase: 64 U/L (ref 38–126)
Anion gap: 9 (ref 5–15)
BUN: 14 mg/dL (ref 8–23)
CO2: 25 mmol/L (ref 22–32)
Calcium: 8.6 mg/dL — ABNORMAL LOW (ref 8.9–10.3)
Chloride: 101 mmol/L (ref 98–111)
Creatinine, Ser: 0.79 mg/dL (ref 0.44–1.00)
GFR calc Af Amer: >60 mL/min (ref >60)
GFR calc non Af Amer: >60 mL/min (ref >60)
Glucose, Bld: 204 mg/dL — ABNORMAL HIGH (ref 70–99)
Potassium: 3.4 mmol/L — ABNORMAL LOW (ref 3.5–5.1)
Sodium: 135 mmol/L (ref 135–145)
Total Bilirubin: 0.5 mg/dL (ref 0.3–1.2)
Total Protein: 7.2 g/dL (ref 6.5–8.1)

## 2019-12-31 MED ORDER — SODIUM CHLORIDE 0.9 % IV SOLN: Freq: Once | INTRAVENOUS | Status: AC

## 2019-12-31 MED ORDER — HYDROMORPHONE HCL 1 MG/ML IJ SOLN
2.0000 mg | Freq: Once | INTRAMUSCULAR | Status: AC
  Administered 2019-12-31: 2 mg via INTRAVENOUS
  Filled 2019-12-31: qty 2

## 2019-12-31 MED ORDER — ALTEPLASE 2 MG IJ SOLR
INTRAMUSCULAR | Status: AC
  Filled 2019-12-31: qty 2

## 2019-12-31 MED ORDER — DIPHENHYDRAMINE HCL 50 MG/ML IJ SOLN
50.0000 mg | Freq: Once | INTRAMUSCULAR | Status: AC
  Administered 2019-12-31: 50 mg via INTRAVENOUS
  Filled 2019-12-31: qty 1

## 2019-12-31 MED ORDER — SODIUM CHLORIDE 0.9 % IV SOLN
10.0000 mg | Freq: Once | INTRAVENOUS | Status: AC
  Administered 2019-12-31: 10 mg via INTRAVENOUS
  Filled 2019-12-31: qty 1

## 2019-12-31 MED ORDER — FAMOTIDINE IN NACL 20-0.9 MG/50ML-% IV SOLN
20.0000 mg | Freq: Once | INTRAVENOUS | Status: AC
  Administered 2019-12-31: 20 mg via INTRAVENOUS
  Filled 2019-12-31: qty 50

## 2019-12-31 MED ORDER — SODIUM CHLORIDE 0.9 % IV SOLN
Freq: Once | INTRAVENOUS | Status: AC
  Administered 2019-12-31: 8 mg via INTRAVENOUS
  Filled 2019-12-31: qty 4

## 2019-12-31 MED ORDER — SODIUM CHLORIDE 0.9 % IV SOLN
80.0000 mg/m2 | Freq: Once | INTRAVENOUS | Status: AC
  Administered 2019-12-31: 192 mg via INTRAVENOUS
  Filled 2019-12-31: qty 32

## 2019-12-31 MED ORDER — STERILE WATER FOR INJECTION IJ SOLN
INTRAMUSCULAR | Status: AC
  Filled 2019-12-31: qty 10

## 2019-12-31 MED ORDER — SODIUM CHLORIDE 0.9% FLUSH: 10.0000 mL | INTRAVENOUS | Status: DC | PRN

## 2019-12-31 MED ORDER — LANREOTIDE ACETATE 120 MG/0.5ML ~~LOC~~ SOLN
SUBCUTANEOUS | Status: AC
  Filled 2019-12-31: qty 120

## 2019-12-31 MED ORDER — HEPARIN SOD (PORK) LOCK FLUSH 100 UNIT/ML IV SOLN
500.0000 [IU] | Freq: Once | INTRAVENOUS | Status: AC | PRN
  Administered 2019-12-31: 500 [IU]

## 2019-12-31 NOTE — Progress Notes (Signed)
Labs WNL.  Has questions for MD.  Was seen in ER on Friday for UTI and dehydration.  Will see Dr Raliegh Ip this am.  Pain medication given.  Okay to proceed with treatment. Labs reviewed with Dr Raliegh Ip.  Crystal Vega tolerated her first infusion of taxol without incidence today.  Vital signs WNL prior to discharge. Discharged via wheelchair with husband.

## 2019-12-31 NOTE — Progress Notes (Signed)
[address] [place], [place] [address]   CLINIC:  Medical Oncology/Hematology  PCP:  Lemmie Evens, MD [place] / [place] [address] [phone]   REASON FOR VISIT:  Follow-up for left breast cancer and right colon cancer  PRIOR THERAPY: None  NGS Results: Not done  CURRENT THERAPY: Paclitaxel  BRIEF ONCOLOGIC HISTORY:  Oncology History  Malignant neoplasm of overlapping sites of left breast in female, estrogen receptor positive ([place])  11/16/2019 Cancer Staging   Staging form: Breast, AJCC 8th Edition - Clinical stage from 11/16/2019: Stage IV (cT4b, cN1, cM1, G3, ER+, PR-, HER2-) - Signed by [name], MD on 12/17/2019   11/28/2019 Initial Diagnosis   Patient noted intermittent left breast pain and swelling for several months. Mammogram showed a 6.2cm mass in the left breast axillary tail with an adjacent 0.9cm mass, 2 abnormal left axillary lymph nodes, 1 abnormal right axillary lymph node, and diffuse left breast edema and skin thickening. Biopsy showed IDC, grade 3 in the left breast and bilateral axillas, HER-2 negative (1+), ER+ 100%, PR- 0%, Ki67 30%.    12/31/2019 -  Chemotherapy   The patient had ondansetron (ZOFRAN) 8 mg in sodium chloride 0.9 % 50 mL IVPB, , Intravenous,  Once, 0 of 4 cycles PACLitaxel (TAXOL) 192 mg in sodium chloride 0.9 % 250 mL chemo infusion (</= 25m/m2), 80 mg/m2, Intravenous,  Once, 0 of 4 cycles  for chemotherapy treatment.    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 ACHAMIKA CUNANAN a 69y.o. female, returns for routine follow-up and consideration for initiating chemotherapy. ASonnawas last seen on 12/17/2019.  Due for initiating cycle #1 of paclitaxel today.   Today she reports having pain in her entire left arm and left breast. She reports having difficulty picking up things with her left hand and has difficulty writing with her left hand. She denies ever having dysuria, but she is still taking cephalexin since her ED visit on 12/29/2019. She denies having any diarrhea. She took the Dilaudid BID for her breast pain, but for the last several days has been taking it every 4 hours which has not been helping with her pain.  Overall, she feels ready for next cycle of chemo today.    REVIEW OF SYSTEMS:  Review of Systems  Gastrointestinal: Negative for diarrhea.  Genitourinary: Negative for dysuria.   Musculoskeletal: Positive for arthralgias.  Neurological: Positive for extremity weakness (L upper extremity weakness).  All other systems reviewed and are negative.   PAST MEDICAL/SURGICAL HISTORY:  Past Medical History:  Diagnosis Date  . Anemia   .  Arthritis    per patient " left knee"  . Essential hypertension, benign   . Metastatic breast cancer ([place])    left breast  . Port-A-Cath in place 12/25/2019  . Type 2 diabetes mellitus ([place])    Past Surgical History:  Procedure Laterality Date  . CATARACT EXTRACTION W/ INTRAOCULAR LENS IMPLANT Right   . HYSTEROSCOPY WITH D & C N/A 06/05/2014   Procedure:  DILATATION AND CURETTAGE /HYSTEROSCOPY;  Surgeon: Florian Buff, MD;  Location: [facility];  Service: Gynecology;  Laterality: N/A;  . POLYPECTOMY N/A 06/05/2014   Procedure: ENDOMETRIAL POLYPECTOMY;  Surgeon: Florian Buff, MD;  Location: [facility];  Service: Gynecology;  Laterality: N/A;  . PORTACATH PLACEMENT N/A 12/14/2019   Procedure: INSERTION PORT-A-CATH WITH ULTRASOUND GUIDANCE;  Surgeon: Donnie Mesa, MD;  Location: [facility];  Service: General;  Laterality: N/A;  . TUBAL LIGATION      SOCIAL HISTORY:  Social History   Socioeconomic History  . Marital status: Married    Spouse name: Herbie Baltimore  . Number of children: 2  . Years of education: Not on file  . Highest education level: Not on file  Occupational History  . Occupation: retired  Tobacco Use  . Smoking status: Former Smoker    Types: Cigarettes  . Smokeless tobacco: Never Used  . Tobacco comment: quit about 40+ years ago  Vaping Use  . Vaping Use: Never used  Substance and Sexual Activity  . Alcohol use: Never  . Drug use: No  . Sexual activity: Not on file  Other Topics Concern  . Not on file  Social History Narrative  . Not on file   Social Determinants of Health   Financial Resource Strain: Low Risk   . Difficulty of Paying Living Expenses: Not hard at all  Food Insecurity: No Food Insecurity  . Worried About Charity fundraiser in the Last Year: Never true  . Ran Out of Food in the Last Year: Never true  Transportation Needs: No Transportation Needs  . Lack of Transportation (Medical): No  . Lack of Transportation (Non-Medical): No  Physical Activity: Inactive  . Days of Exercise per Week: 0 days  . Minutes of Exercise per Session: 0 min  Stress: No Stress Concern Present  . Feeling of Stress : Only a little  Social Connections: Moderately Integrated  . Frequency of Communication with Friends and Family: Three times a week  . Frequency of Social Gatherings with Friends and Family: Three times a week  . Attends  Religious Services: 1 to 4 times per year  . Active Member of Clubs or Organizations: No  . Attends Archivist Meetings: Never  . Marital Status: Married  Human resources officer Violence: Not At Risk  . Fear of Current or Ex-Partner: No  . Emotionally Abused: No  . Physically Abused: No  . Sexually Abused: No    FAMILY HISTORY:  Family History  Problem Relation Age of Onset  . Diabetes Mellitus II Father   . Cancer Mother        Deceased at age 33; patient not sure if cancer was in her stomach or colon    CURRENT MEDICATIONS:  Current Outpatient Medications  Medication Sig Dispense Refill  . cephALEXin (KEFLEX) 500 MG capsule Take 1 capsule (500 mg total) by mouth 3 (three) times daily. 30 capsule 0  . ferrous sulfate 325 (65 FE) MG tablet Take 325 mg by mouth daily with breakfast.    . hydrochlorothiazide (HYDRODIURIL) 25  MG tablet Take 25 mg by mouth daily.    Marland Kitchen HYDROcodone-acetaminophen (NORCO/VICODIN) 5-325 MG tablet Take 1 tablet by mouth every 6 (six) hours as needed for moderate pain. 15 tablet 0  . HYDROmorphone (DILAUDID) 2 MG tablet Take 1 tablet (2 mg total) by mouth every 4 (four) hours as needed for severe pain. 30 tablet 0  . ibuprofen (ADVIL,MOTRIN) 800 MG tablet Take 800 mg by mouth every 8 (eight) hours as needed for moderate pain.     Marland Kitchen lidocaine-prilocaine (EMLA) cream Apply a small amount to port a cath site and cover with plastic wrap 1 hour prior to chemotherapy appointments 30 g 3  . losartan (COZAAR) 50 MG tablet Take 50 mg by mouth daily.    . metFORMIN (GLUCOPHAGE) 500 MG tablet Take 500 mg by mouth daily with breakfast.     . oxybutynin (DITROPAN) 5 MG tablet Take 5 mg by mouth daily.     Derrill Memo ON 01/07/2020] PACLitaxel (TAXOL IV) Inject 80 mg/m2 into the vein once a week. Day 1, 8, 15 every 28 days    . potassium chloride (MICRO-K) 10 MEQ CR capsule Take 10 mEq by mouth daily.     . pravastatin (PRAVACHOL) 10 MG tablet Take 10 mg by mouth daily.       . prochlorperazine (COMPAZINE) 10 MG tablet Take 1 tablet (10 mg total) by mouth every 6 (six) hours as needed (Nausea or vomiting). 30 tablet 1   No current facility-administered medications for this visit.    ALLERGIES:  No Known Allergies  PHYSICAL EXAM:  Performance status (ECOG): 1 - Symptomatic but completely ambulatory  There were no vitals filed for this visit. Wt Readings from Last 3 Encounters:  12/31/19 (!) 290 lb (131.5 kg)  12/28/19 (!) 290 lb (131.5 kg)  12/28/19 (!) 290 lb (131.5 kg)   Physical Exam Vitals reviewed.  Constitutional:      Appearance: Normal appearance. She is obese.  Chest:     Breasts:        Left: Tenderness present.     Comments: Port-a-Cath on R chest Musculoskeletal:     Left upper arm: Tenderness present.  Neurological:     General: No focal deficit present.     Mental Status: She is alert and oriented to person, place, and time.     Motor: Weakness (LUE weakness) present.  Psychiatric:        Mood and Affect: Mood normal.        Behavior: Behavior normal.     LABORATORY DATA:  I have reviewed the labs as listed.  CBC Latest Ref Rng & Units 12/31/2019 12/29/2019 12/11/2019  WBC 4.0 - 10.5 K/uL 6.8 13.2(H) 6.5  Hemoglobin 12.0 - 15.0 g/dL 11.1(L) 12.2 12.2  Hematocrit 36 - 46 % 34.6(L) 37.7 38.1  Platelets 150 - 400 K/uL 213 239 287   CMP Latest Ref Rng & Units 12/31/2019 12/29/2019 12/10/2019  Glucose 70 - 99 mg/dL 204(H) 170(H) 119(H)  BUN 8 - 23 mg/dL 14 18 16   Creatinine 0.44 - 1.00 mg/dL 0.79 0.79 0.68  Sodium 135 - 145 mmol/L 135 134(L) 136  Potassium 3.5 - 5.1 mmol/L 3.4(L) 3.8 4.0  Chloride 98 - 111 mmol/L 101 100 101  CO2 22 - 32 mmol/L 25 24 25   Calcium 8.9 - 10.3 mg/dL 8.6(L) 8.6(L) 9.0  Total Protein 6.5 - 8.1 g/dL 7.2 8.2(H) 7.8  Total Bilirubin 0.3 - 1.2 mg/dL 0.5 0.6 0.5  Alkaline Phos 38 -  126 U/L 64 76 73  AST 15 - 41 U/L 28 37 31  ALT 0 - 44 U/L 17 21 18     DIAGNOSTIC IMAGING:  I have independently reviewed  the scans and discussed with the patient. CT Chest W Contrast  Result Date: 12/29/2019 CLINICAL DATA:  Chest pain and shortness of breath, metastatic breast cancer, liver ultrasound prior day EXAM: CT CHEST WITH CONTRAST TECHNIQUE: Multidetector CT imaging of the chest was performed during intravenous contrast administration. CONTRAST:  152m OMNIPAQUE IOHEXOL 300 MG/ML  SOLN COMPARISON:  December 12, 2019 FINDINGS: Cardiovascular: There are no significant vascular findings. The heart size is normal. There is no pericardial thickening or effusion. Mediastinum/Nodes: Again noted is extensive skin thickening of the left breast wall with a heterogeneous hypodense mass along the left chest wall measuring 6.7 x 5.2 cm. Extensive surrounding left chest wall and axillary adenopathy is again identified. There is also extensive left supraclavicular adenopathy. Adenopathy is noted within the left pre vascular space and anterior mediastinum. This appears grossly unchanged from the prior exam. The trachea, thyroid, and esophagus are grossly unremarkable. Lungs/Pleura: There are no areas consolidation. No suspicious pulmonary nodules. There is no pleural effusion. Musculoskeletal/Chest wall: There is a lytic lesion again identified in the posterior T4 vertebral body best seen on series 2, image 17. Abdomen/pelvis: Hepatobiliary: The multiple small hypodense lesions throughout the liver are not well seen on this examination comparison to prior. The gallbladder is unremarkable. The portal vein is patent. Pancreas: Unremarkable. No pancreatic ductal dilatation or surrounding inflammatory changes. Spleen: Normal in size without focal abnormality. Adrenals/Urinary Tract: Both adrenal glands appear normal. Again noted is a 1 cm low-density lesion seen in the upper pole of the right kidney. There appears to be mild right upper pole perinephric stranding. There is also a small hypodense lesion in the lower pole the left kidney. Bladder  is unremarkable. Stomach/Bowel: The stomach and small bowel are unremarkable. The heterogeneous circumferential wall thickening seen within the right colon at the hepatic flexure with mild surrounding fat stranding changes is again identified. The remainder of the colon is grossly unremarkable. There is scattered colonic diverticula. The appendix is unremarkable. Vascular/Lymphatic: There are no enlarged mesenteric, retroperitoneal, or pelvic lymph nodes. Scattered aortic atherosclerotic calcifications are seen without aneurysmal dilatation. Reproductive: Large partially calcified uterine fibroids are present. Other: No evidence of abdominal wall mass or hernia. Musculoskeletal: Advanced left hip osteoarthritis is seen with a probable healed fracture deformity. IMPRESSION: 1. Unchanged left chest wall mass with diffuse skin thickening and extensive axillary, chest wall, mediastinal adenopathy. 2. Mild right-sided perinephric stranding which is nonspecific, however could be due to pyelonephritis. 3. Small hypodense liver lesions, not significantly changed since prior exam. 4. Circumferential wall thickening at the hepatic flexure, concerning for metastatic disease versus colonic primary. 5. Aortic Atherosclerosis (ICD10-I70.0). 6. Lytic lesion in the posterior T4 vertebral body, concerning for osseous metastases. Electronically Signed   By: BPrudencio PairM.D.   On: 12/29/2019 03:11   CT Chest W Contrast  Result Date: 12/12/2019 CLINICAL DATA:  Newly diagnosed left breast carcinoma.  Staging. EXAM: CT CHEST, ABDOMEN, AND PELVIS WITH CONTRAST TECHNIQUE: Multidetector CT imaging of the chest, abdomen and pelvis was performed following the standard protocol during bolus administration of intravenous contrast. CONTRAST:  1033mOMNIPAQUE IOHEXOL 300 MG/ML  SOLN COMPARISON:  [facility] only CT on 03/15/2014 FINDINGS: CT CHEST FINDINGS Cardiovascular: No acute findings. Mediastinum/Lymph Nodes: A large mass is seen in the  axillary tail of the  left breast measuring 6.3 x 6.1 cm, likely representing primary breast carcinoma. Diffuse left breast skin thickening and soft tissue stranding is also demonstrated, consistent with carcinoma. Bulky lymphadenopathy is seen within the left subpectoral and axillary regions, with largest lymph node measuring 3.3 cm on image 12/2. Mild mediastinal lymphadenopathy is seen in the right paratracheal pre-vascular, and lateral aortic regions, with largest lymph node in the lateral aortic region measuring 2.4 cm on image 17/2. lymphadenopathy is seen in the lower neck in the inferior jugular and supraclavicular regions, left side greater than right. Mild lymphadenopathy is also seen in the right axilla, with largest lymph node measuring 11 mm on image 14/2. Lungs/Pleura: No pulmonary infiltrate or mass identified. No effusion present. Musculoskeletal:  No suspicious bone lesions identified. CT ABDOMEN AND PELVIS FINDINGS Hepatobiliary: Multiple small hypovascular lesions are seen in the right and left lobes which are new since previous study and consistent with liver metastases. Index lesion in the medial dome of the right hepatic lobe measures 1.7 x 1.3 cm on image 35/2. Gallbladder is unremarkable. No evidence of biliary ductal dilatation. Pancreas:  No mass or inflammatory changes. Spleen:  Within normal limits in size and appearance. Adrenals/Urinary tract: Normal adrenal glands. A few small bilateral renal cysts are noted. No masses or hydronephrosis. Stomach/Bowel: An annular constricting mass is seen involving the ascending colon which measures 3.6 x 3.5 cm (image 57/2), highly suspicious for primary colon carcinoma. Small less than 1 cm right pericolonic lymph nodes are consistent with local lymph node metastases. No evidence of bowel obstruction. Vascular/Lymphatic: No pathologically enlarged lymph nodes identified. No abdominal aortic aneurysm. Reproductive: Several partially calcified uterine  fibroids are again seen, largest measuring approximately 4 cm. Adnexal regions are unremarkable. Other:  None. Musculoskeletal:  No suspicious bone lesions identified. IMPRESSION: 6.3 cm mass in axillary tail of left breast, with diffuse left breast skin thickening and soft tissue stranding, consistent with primary breast carcinoma. Bulky left subpectoral, axillary, and mediastinal lymphadenopathy, consistent with metastatic disease. Lymphadenopathy also seen in the lower neck, left side greater than right, and mildly enlarged lymph node in the right axilla. Multiple small liver metastases. Annular constricting mass in the ascending colon, highly suspicious for primary colon carcinoma. Adjacent sub-cm right pericolonic lymph nodes, consistent with local lymph node metastases. Colonoscopy is recommended. Uterine fibroids. Electronically Signed   By: Marlaine Hind M.D.   On: 12/12/2019 15:45   NM Bone Scan Whole Body  Result Date: 12/12/2019 CLINICAL DATA:  Breast cancer.  New diagnosis.  Staging. EXAM: NUCLEAR MEDICINE WHOLE BODY BONE SCAN TECHNIQUE: Whole body anterior and posterior images were obtained approximately 3 hours after intravenous injection of radiopharmaceutical. RADIOPHARMACEUTICALS:  21.0 mCi Technetium-57mMDP IV COMPARISON:  CT 11/12/2019 FINDINGS: No foci of radiotracer within the axillary or appendicular skeleton to localize breast cancer metastasis. Uptake in the proximal LEFT femur is favored related to severe degenerative change seen on comparison CT. Degenerative uptake noted in the knees. Degenerate uptake noted in the SI joints. Inflammatory change of the LEFT breast. IMPRESSION: No evidence skeletal metastasis. Electronically Signed   By: SSuzy BouchardM.D.   On: 12/12/2019 17:16   CT Abdomen Pelvis W Contrast  Result Date: 12/29/2019 CLINICAL DATA:  Chest pain and shortness of breath, metastatic breast cancer, liver ultrasound prior day EXAM: CT CHEST WITH CONTRAST TECHNIQUE:  Multidetector CT imaging of the chest was performed during intravenous contrast administration. CONTRAST:  1052mOMNIPAQUE IOHEXOL 300 MG/ML  SOLN COMPARISON:  December 12, 2019  FINDINGS: Cardiovascular: There are no significant vascular findings. The heart size is normal. There is no pericardial thickening or effusion. Mediastinum/Nodes: Again noted is extensive skin thickening of the left breast wall with a heterogeneous hypodense mass along the left chest wall measuring 6.7 x 5.2 cm. Extensive surrounding left chest wall and axillary adenopathy is again identified. There is also extensive left supraclavicular adenopathy. Adenopathy is noted within the left pre vascular space and anterior mediastinum. This appears grossly unchanged from the prior exam. The trachea, thyroid, and esophagus are grossly unremarkable. Lungs/Pleura: There are no areas consolidation. No suspicious pulmonary nodules. There is no pleural effusion. Musculoskeletal/Chest wall: There is a lytic lesion again identified in the posterior T4 vertebral body best seen on series 2, image 17. Abdomen/pelvis: Hepatobiliary: The multiple small hypodense lesions throughout the liver are not well seen on this examination comparison to prior. The gallbladder is unremarkable. The portal vein is patent. Pancreas: Unremarkable. No pancreatic ductal dilatation or surrounding inflammatory changes. Spleen: Normal in size without focal abnormality. Adrenals/Urinary Tract: Both adrenal glands appear normal. Again noted is a 1 cm low-density lesion seen in the upper pole of the right kidney. There appears to be mild right upper pole perinephric stranding. There is also a small hypodense lesion in the lower pole the left kidney. Bladder is unremarkable. Stomach/Bowel: The stomach and small bowel are unremarkable. The heterogeneous circumferential wall thickening seen within the right colon at the hepatic flexure with mild surrounding fat stranding changes is again  identified. The remainder of the colon is grossly unremarkable. There is scattered colonic diverticula. The appendix is unremarkable. Vascular/Lymphatic: There are no enlarged mesenteric, retroperitoneal, or pelvic lymph nodes. Scattered aortic atherosclerotic calcifications are seen without aneurysmal dilatation. Reproductive: Large partially calcified uterine fibroids are present. Other: No evidence of abdominal wall mass or hernia. Musculoskeletal: Advanced left hip osteoarthritis is seen with a probable healed fracture deformity. IMPRESSION: 1. Unchanged left chest wall mass with diffuse skin thickening and extensive axillary, chest wall, mediastinal adenopathy. 2. Mild right-sided perinephric stranding which is nonspecific, however could be due to pyelonephritis. 3. Small hypodense liver lesions, not significantly changed since prior exam. 4. Circumferential wall thickening at the hepatic flexure, concerning for metastatic disease versus colonic primary. 5. Aortic Atherosclerosis (ICD10-I70.0). 6. Lytic lesion in the posterior T4 vertebral body, concerning for osseous metastases. Electronically Signed   By: Prudencio Pair M.D.   On: 12/29/2019 03:11   CT Abdomen Pelvis W Contrast  Result Date: 12/12/2019 CLINICAL DATA:  Newly diagnosed left breast carcinoma.  Staging. EXAM: CT CHEST, ABDOMEN, AND PELVIS WITH CONTRAST TECHNIQUE: Multidetector CT imaging of the chest, abdomen and pelvis was performed following the standard protocol during bolus administration of intravenous contrast. CONTRAST:  155m OMNIPAQUE IOHEXOL 300 MG/ML  SOLN COMPARISON:  [facility] only CT on 03/15/2014 FINDINGS: CT CHEST FINDINGS Cardiovascular: No acute findings. Mediastinum/Lymph Nodes: A large mass is seen in the axillary tail of the left breast measuring 6.3 x 6.1 cm, likely representing primary breast carcinoma. Diffuse left breast skin thickening and soft tissue stranding is also demonstrated, consistent with carcinoma. Bulky  lymphadenopathy is seen within the left subpectoral and axillary regions, with largest lymph node measuring 3.3 cm on image 12/2. Mild mediastinal lymphadenopathy is seen in the right paratracheal pre-vascular, and lateral aortic regions, with largest lymph node in the lateral aortic region measuring 2.4 cm on image 17/2. lymphadenopathy is seen in the lower neck in the inferior jugular and supraclavicular regions, left side greater than right.  Mild lymphadenopathy is also seen in the right axilla, with largest lymph node measuring 11 mm on image 14/2. Lungs/Pleura: No pulmonary infiltrate or mass identified. No effusion present. Musculoskeletal:  No suspicious bone lesions identified. CT ABDOMEN AND PELVIS FINDINGS Hepatobiliary: Multiple small hypovascular lesions are seen in the right and left lobes which are new since previous study and consistent with liver metastases. Index lesion in the medial dome of the right hepatic lobe measures 1.7 x 1.3 cm on image 35/2. Gallbladder is unremarkable. No evidence of biliary ductal dilatation. Pancreas:  No mass or inflammatory changes. Spleen:  Within normal limits in size and appearance. Adrenals/Urinary tract: Normal adrenal glands. A few small bilateral renal cysts are noted. No masses or hydronephrosis. Stomach/Bowel: An annular constricting mass is seen involving the ascending colon which measures 3.6 x 3.5 cm (image 57/2), highly suspicious for primary colon carcinoma. Small less than 1 cm right pericolonic lymph nodes are consistent with local lymph node metastases. No evidence of bowel obstruction. Vascular/Lymphatic: No pathologically enlarged lymph nodes identified. No abdominal aortic aneurysm. Reproductive: Several partially calcified uterine fibroids are again seen, largest measuring approximately 4 cm. Adnexal regions are unremarkable. Other:  None. Musculoskeletal:  No suspicious bone lesions identified. IMPRESSION: 6.3 cm mass in axillary tail of left  breast, with diffuse left breast skin thickening and soft tissue stranding, consistent with primary breast carcinoma. Bulky left subpectoral, axillary, and mediastinal lymphadenopathy, consistent with metastatic disease. Lymphadenopathy also seen in the lower neck, left side greater than right, and mildly enlarged lymph node in the right axilla. Multiple small liver metastases. Annular constricting mass in the ascending colon, highly suspicious for primary colon carcinoma. Adjacent sub-cm right pericolonic lymph nodes, consistent with local lymph node metastases. Colonoscopy is recommended. Uterine fibroids. Electronically Signed   By: Marlaine Hind M.D.   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 LABS:  None performed today EXAM: BILATERAL BREAST MRI WITH AND WITHOUT CONTRAST TECHNIQUE: Multiplanar, multisequence MR images of both breasts were obtained prior to and following the intravenous administration of 10 ml of Gadavist Three-dimensional MR images were rendered by post-processing of the original MR data on an independent workstation. The three-dimensional MR images were interpreted, and findings are reported in the following complete MRI report for this study. Three dimensional images were evaluated at the independent DynaCad workstation COMPARISON:  Prior mammograms and ultrasounds FINDINGS: Please note that this study is slightly limited technically due to loss of signal posteriorly and some motion artifact. Breast composition: b. Scattered fibroglandular tissue. Background parenchymal enhancement: Mild Right breast: There is mild skin thickening along the very far MEDIAL RIGHT breast which appears to extend from the LEFT side. No other mass or suspicious enhancing abnormality noted within  the RIGHT breast. Left breast: A irregular mass containing biopsy clip artifact is identified within the far posterior OUTER RIGHT breast, at least 5.2 x 8.5 cm, but difficult to accurately measure due to far posterior location and adjacent abnormal lymph nodes. This mass abuts the pectoralis musculature. Diffuse skin thickening of the entire LEFT breast is identified with patchy areas of dermal enhancement. Suspicious patchy and stippled nonmasslike enhancement within all 4 quadrants of the LEFT breast identified. This abnormal nonmasslike enhancement extends just across the midline in the presternal fat to the RIGHT and  abuts the LEFT pectoralis muscle. Lymph nodes: Markedly enlarged LEFT axillary lymph nodes are noted with at least 7 abnormal appearing LEFT axillary lymph nodes. A single enlarged abnormal RIGHT axillary lymph node with biopsy clip artifact is identified. At least 2 enlarged internal mammary lymph nodes on each side are noted. Ancillary findings:  None. IMPRESSION: 1. Multi centric LEFT breast malignancy involving all 4 quadrants of and skin of the LEFT breast, including a 0.5 cm biopsy-proven malignancy within the far posterior OUTER RIGHT breast,, abnormal highly suspicious non masslike enhancement within all 4 quadrants of the LEFT breast and enhancing diffuse LEFT breast skin thickening. This abnormal non masslike enhancement and skin thickening extends across the midline to the far MEDIAL RIGHT breast. Biopsy-proven malignancy and abnormal non masslike enhancement also abuts the LEFT pectoralis musculature. At least 7 abnormal LEFT axillary lymph nodes and 1 abnormal RIGHT axillary lymph node, compatible with metastatic disease to axillary lymph nodes. RECOMMENDATION: Treatment plan. BI-RADS CATEGORY  6: Known biopsy-proven malignancy. Electronically Signed   By: Margarette Canada M.D.   On: 12/10/2019 12:17   US BIOPSY (LIVER)  Result Date: 12/28/2019 INDICATION: History of breast cancer also  with annular lesion involving the ascending colon worrisome colon cancer. Patient also found to have indeterminate liver lesions worrisome for metastatic disease and as such presents now for image guided liver lesion biopsy. EXAM: ULTRASOUND GUIDED LIVER LESION BIOPSY COMPARISON:  CT the chest, abdomen and pelvis-12/12/2019 MEDICATIONS: None ANESTHESIA/SEDATION: Fentanyl  mcg IV; Versed  mg IV Total Moderate Sedation time:   Minutes. The patient's level of consciousness and vital signs were monitored continuously by radiology nursing throughout the procedure under my direct supervision. COMPLICATIONS: None immediate. PROCEDURE: Informed written consent was obtained from the patient after a discussion of the risks, benefits and alternatives to treatment. The patient understands and consents the procedure. A timeout was performed prior to the initiation of the procedure. Ultrasound scanning was performed of the right upper abdominal quadrant demonstrates multiple hypoechoic nodules scattered throughout the right lobe of the liver. A dominant approximately 1.6 x 1.4 cm hypoechoic lesion within the subcapsular aspect of the right lobe of the liver was targeted for biopsy given lesion location and sonographic window. The procedure was planned. The right upper abdominal quadrant was prepped and draped in the usual sterile fashion. The overlying soft tissues were anesthetized with 1% lidocaine with epinephrine. A 17 gauge, 6.8 cm co-axial needle was advanced into a peripheral aspect of the lesion. This was followed by 6 core biopsies with an 18 gauge core device under direct ultrasound guidance. The coaxial needle tract was embolized with a small amount of Gel-Foam slurry and superficial hemostasis was obtained with manual compression. Post procedural scanning was negative for definitive area of hemorrhage or additional complication. A dressing was placed. The patient tolerated the procedure well without immediate post  procedural complication. IMPRESSION: Technically successful ultrasound guided core needle biopsy of indeterminate lesion within the right lobe of the liver. Electronically Signed   By: Sandi Mariscal M.D.   On: 12/28/2019 09:16   DG CHEST PORT 1 VIEW  Result Date: 12/14/2019 CLINICAL DATA:  Port-A-Cath placement.  Metastatic breast cancer. EXAM: PORTABLE CHEST 1 VIEW COMPARISON:  CT 12/12/2019.  Radiographs 09/30/2013. FINDINGS: 0846 hours. New right IJ Port-A-Cath extends to the level of the mid right atrium. The heart size and mediastinal contours are stable with aortic atherosclerosis. There is chronic elevation of the right hemidiaphragm with mildly increased bibasilar atelectasis. No pneumothorax, edema  or significant pleural effusion. IMPRESSION: New right IJ Port-A-Cath placement with mildly increased bibasilar atelectasis. No pneumothorax. Electronically Signed   By: Richardean Sale M.D.   On: 12/14/2019 09:15   DG Fluoro Guide CV Line-No Report  Result Date: 12/14/2019 Fluoroscopy was utilized by the requesting physician.  No radiographic interpretation.     ASSESSMENT:  1.  Metastatic left breast cancer: -Biopsy on 11/16/2019 shows infiltrative ductal carcinoma, grade 3, ER 100%, PR 0%, HER-2 1+, Ki-67 30%.  Right breast upper outer quadrant biopsy was also positive for malignancy. -MRI of the breast on 12/10/2019 shows left breast malignancy involving all 4 quadrants and skin of the left breast including 0.5 cm biopsy-proven malignancy within the far posterior outer right breast, abnormal highly suspicious nonmuscle like enhancement within all 4 quadrants of the left breast and diffuse skin thickening.  This abnormal non-mass-like enhancement and skin thickening extends across the midline to the  medial right breast.  At least 7 abnormal left axillary lymph nodes and 1 abnormal right axillary lymph node compatible with metastatic disease. -CT CAP on 12/12/2019 shows bulky lymphadenopathy in the  left subpectoral and axillary regions with largest lymph node measuring 3.3 cm.  Mild mediastinal adenopathy in the right paratracheal prevascular and lateral aortic regions, largest lymph node in the lateral aortic region measuring 2.4 cm.  Lymphadenopathy in the inferior jugular and supraclavicular regions, left side greater than right.  Mild lymphadenopathy in the right axilla. -Multiple small hypervascular lesions seen in the right and left lobes consistent with liver metastasis. -Bone scan on 12/12/2019 did not show any bone metastasis. -CT CAP on 12/29/2019 showed possible lytic lesion in the T4 vertebral body.  2.  Right colonic mass: -CT scan showed constricting mass involving the ascending colon measuring 3.6 x 3.5 cm with a small 1 cm right pericolonic lymph node. -She never had a colonoscopy.  Denies any bleeding per rectum or melena.   PLAN:  1.  Metastatic left breast cancer: -Liver biopsy was done on 12/28/2019.  Results are pending. -She is complaining of worsening pain in the left breast and left upper extremity. -Reviewed results from CT CAP from recent ER visit on 12/29/2019. -Labs today shows normal LFTs with decreased albumin.  Normal CBC. -She will proceed with first dose of paclitaxel today.  We discussed side effects in detail. -She will be seen back in 1 week for follow-up.  2.  Right colonic mass: -She is scheduled for colonoscopy and biopsy on 01/07/2020.  3.  Left breast and left upper extremity pain: -She is taking Dilaudid 2 mg every 4 hours as needed.  Initially it helped and is not working. -Chemotherapy will likely help.  I have also told her to increase Dilaudid to 2 mg every 3 hours.   Orders placed this encounter:  No orders of the defined types were placed in this encounter.    [name], MD [place] [phone]   I, Milinda Antis, am acting as a scribe for Dr. Sanda Linger.  I, [name] MD, have  reviewed the above documentation for accuracy and completeness, and I agree with the above.

## 2019-12-31 NOTE — Progress Notes (Signed)
.   Pharmacist Chemotherapy Monitoring - Initial Assessment    Anticipated start date: 12/219   Regimen:  . Are orders appropriate based on the patient's diagnosis, regimen, and cycle? Yes . Does the plan date match the patient's scheduled date? Yes . Is the sequencing of drugs appropriate? Yes . Are the premedications appropriate for the patient's regimen? Yes . Prior Authorization for treatment is: Approved o If applicable, is the correct biosimilar selected based on the patient's insurance? not applicable  Organ Function and Labs: Marland Kitchen Are dose adjustments needed based on the patient's renal function, hepatic function, or hematologic function? Yes . Are appropriate labs ordered prior to the start of patient's treatment? Yes . Other organ system assessment, if indicated: N/A . The following baseline labs, if indicated, have been ordered: N/A  Dose Assessment: . Are the drug doses appropriate? Yes . Are the following correct: o Drug concentrations Yes o IV fluid compatible with drug Yes o Administration routes Yes o Timing of therapy Yes . If applicable, does the patient have documented access for treatment and/or plans for port-a-cath placement? yes . If applicable, have lifetime cumulative doses been properly documented and assessed? not applicable Lifetime Dose Tracking  No doses have been documented on this patient for the following tracked chemicals: Doxorubicin, Epirubicin, Idarubicin, Daunorubicin, Mitoxantrone, Bleomycin, Oxaliplatin, Carboplatin, Liposomal Doxorubicin  o   Toxicity Monitoring/Prevention: . The patient has the following take home antiemetics prescribed: Prochlorperazine . The patient has the following take home medications prescribed: N/A . Medication allergies and previous infusion related reactions, if applicable, have been reviewed and addressed. Yes . The patient's current medication list has been assessed for drug-drug interactions with their  chemotherapy regimen. no significant drug-drug interactions were identified on review.  Order Review: . Are the treatment plan orders signed? No . Is the patient scheduled to see a provider prior to their treatment? Yes  I verify that I have reviewed each item in the above checklist and answered each question accordingly.  Wynona Neat 12/31/2019 8:10 AM

## 2019-12-31 NOTE — Patient Instructions (Signed)
[place] at [facility] Discharge Instructions  You were seen today by Dr. Delton Coombes. He went over your recent results. You received your treatment today. Take the hydromorphone every 3 hours as needed for the pain. Dr. Delton Coombes will see you back in 1 week for labs and follow up.   Thank you for choosing [place] at [facility] to provide your oncology and hematology care.  To afford each patient quality time with our provider, please arrive at least 15 minutes before your scheduled appointment time.   If you have a lab appointment with the [facility] please come in thru the Main Entrance and check in at the main information desk  You need to re-schedule your appointment should you arrive 10 or more minutes late.  We strive to give you quality time with our providers, and arriving late affects you and other patients whose appointments are after yours.  Also, if you no show three or more times for appointments you may be dismissed from the clinic at the providers discretion.     Again, thank you for choosing [facility].  Our hope is that these requests will decrease the amount of time that you wait before being seen by our physicians.       _____________________________________________________________  Should you have questions after your visit to [facility], please contact our office at [phone] between the hours of 8:00 a.m. and 4:30 p.m.  Voicemails left after 4:00 p.m. will not be returned until the following business day.  For prescription refill requests, have your pharmacy contact our office and allow 72 hours.    Cancer Center Support Programs:   > Cancer Support Group  2nd Tuesday of the month 1pm-2pm, Journey Room

## 2019-12-31 NOTE — Patient Instructions (Signed)
[facility] at [facility] Discharge Instructions  Labs drawn from portacath today   Thank you for choosing [facility] at [facility] to provide your oncology and hematology care.  To afford each patient quality time with our provider, please arrive at least 15 minutes before your scheduled appointment time.   If you have a lab appointment with the Cancer Center please come in thru the Main Entrance and check in at the main information desk.  You need to re-schedule your appointment should you arrive 10 or more minutes late.  We strive to give you quality time with our providers, and arriving late affects you and other patients whose appointments are after yours.  Also, if you no show three or more times for appointments you may be dismissed from the clinic at the providers discretion.     Again, thank you for choosing [facility].  Our hope is that these requests will decrease the amount of time that you wait before being seen by our physicians.       _____________________________________________________________  Should you have questions after your visit to [facility], please contact our office at [phone] and follow the prompts.  Our office hours are 8:00 a.m. and 4:30 p.m. Monday - Friday.  Please note that voicemails left after 4:00 p.m. may not be returned until the following business day.  We are closed weekends and major holidays.  You do have access to a nurse 24-7, just call the main number to the clinic [phone] and do not press any options, hold on the line and a nurse will answer the phone.    For prescription refill requests, have your pharmacy contact our office and allow 72 hours.    Due to Covid, you will need to wear a mask upon entering the hospital. If you do not have a mask, a mask will be given to you at the Main Entrance upon arrival. For doctor visits, patients may have 1 support person age 18  or older with them. For treatment visits, patients can not have anyone with them due to social distancing guidelines and our immunocompromised population.     

## 2019-12-31 NOTE — Progress Notes (Signed)
Patient has been assessed, vital signs and labs have been reviewed by Dr. Katragadda. ANC, Creatinine, LFTs, and Platelets are within treatment parameters per Dr. Katragadda. The patient is good to proceed with treatment at this time.  

## 2019-12-31 NOTE — Patient Instructions (Signed)
[facility] Discharge Instructions for Patients Receiving Chemotherapy  Today you received the following chemotherapy agents   To help prevent nausea and vomiting after your treatment, we encourage you to take your nausea medication   If you develop nausea and vomiting that is not controlled by your nausea medication, call the clinic.   BELOW ARE SYMPTOMS THAT SHOULD BE REPORTED IMMEDIATELY:  *FEVER GREATER THAN 100.5 F  *CHILLS WITH OR WITHOUT FEVER  NAUSEA AND VOMITING THAT IS NOT CONTROLLED WITH YOUR NAUSEA MEDICATION  *UNUSUAL SHORTNESS OF BREATH  *UNUSUAL BRUISING OR BLEEDING  TENDERNESS IN MOUTH AND THROAT WITH OR WITHOUT PRESENCE OF ULCERS  *URINARY PROBLEMS  *BOWEL PROBLEMS  UNUSUAL RASH Items with * indicate a potential emergency and should be followed up as soon as possible.  Feel free to call the clinic should you have any questions or concerns. The clinic phone number is [phone].  Please show the CHEMO ALERT CARD at check-in to the Emergency Department and triage nurse.   

## 2020-01-01 ENCOUNTER — Telehealth (HOSPITAL_COMMUNITY): Payer: Self-pay | Admitting: Emergency Medicine

## 2020-01-01 ENCOUNTER — Telehealth: Payer: Self-pay

## 2020-01-01 LAB — SURGICAL PATHOLOGY

## 2020-01-01 NOTE — Telephone Encounter (Signed)
24 hr call back after chemo treatment, pt states that she is doing well after her first treatment.  She has not had any nausea or vomiting.  She has had a little pain in her arm but she is taking her pain medication.  She was very thankful for the phone for the call.

## 2020-01-01 NOTE — Telephone Encounter (Signed)
Post ED Visit - Positive Culture Follow-up  Culture report reviewed by antimicrobial stewardship pharmacist: [facility] Team []  Elenor Quinones, Pharm.D. []  Heide Guile, Pharm.D., BCPS AQ-ID []  Parks Neptune, Pharm.D., BCPS []  Alycia Rossetti, Pharm.D., BCPS []  [place], [place].D., BCPS, AAHIVP []  Legrand Como, Pharm.D., BCPS, AAHIVP []  Salome Arnt, PharmD, BCPS []  Johnnette Gourd, PharmD, BCPS []  Hughes Better, PharmD, BCPS []  Leeroy Cha, PharmD []  Laqueta Linden, PharmD, BCPS []  Albertina Parr, PharmD [place] Team []  Leodis Sias, PharmD []  Lindell Spar, PharmD []  Royetta Asal, PharmD []  Graylin Shiver, Rph []  Rema Fendt) Glennon Mac, PharmD []  Arlyn Dunning, PharmD []  Netta Cedars, PharmD []  Dia Sitter, PharmD []  Leone Haven, PharmD []  Gretta Arab, PharmD []  Theodis Shove, PharmD []  Peggyann Juba, PharmD []  Reuel Boom, PharmD   Positive urine culture Treated with Cephalexin, organism sensitive to the same and no further patient follow-up is required at this time.  Genia Del 01/01/2020, 8:29 AM

## 2020-01-02 ENCOUNTER — Telehealth: Payer: Self-pay | Admitting: *Deleted

## 2020-01-02 ENCOUNTER — Inpatient Hospital Stay (HOSPITAL_BASED_OUTPATIENT_CLINIC_OR_DEPARTMENT_OTHER): Payer: Medicare HMO | Admitting: Hematology

## 2020-01-02 ENCOUNTER — Other Ambulatory Visit: Payer: Self-pay

## 2020-01-02 ENCOUNTER — Encounter (HOSPITAL_COMMUNITY)
Admission: RE | Admit: 2020-01-02 | Discharge: 2020-01-02 | Disposition: A | Discharge status: Home / Self Care | Payer: Medicare HMO | Source: Ambulatory Visit | Attending: Internal Medicine | Admitting: Internal Medicine

## 2020-01-02 DIAGNOSIS — C787 Secondary malignant neoplasm of liver and intrahepatic bile duct: Secondary | ICD-10-CM | POA: Diagnosis not present

## 2020-01-02 DIAGNOSIS — C50919 Malignant neoplasm of unspecified site of unspecified female breast: Secondary | ICD-10-CM | POA: Diagnosis not present

## 2020-01-02 NOTE — Progress Notes (Signed)
Virtual Visit via Telephone Note  I connected with Crystal Vega on 01/02/20 at  9:30 AM EDT by telephone and verified that I am speaking with the correct person using two identifiers.   I discussed the limitations, risks, security and privacy concerns of performing an evaluation and management service by telephone and the availability of in person appointments. I also discussed with the patient that there may be a patient responsible charge related to this service. The patient expressed understanding and agreed to proceed.   History of Present Illness: She is seen in our clinic for metastatic left breast cancer and right colonic mass.  She underwent biopsy of the left breast mass on 11/16/2019.  CT CAP on 12/12/2019 showed bulky lymphadenopathy in the left subpectoral and axillary regions with largest lymph node measuring 3.3 cm.   Observations/Objective: She reports that she has been feeling very well in terms of improvement in energy since chemotherapy on Monday.  Did not report any nausea or vomiting.  Did not have any bowel movement since Monday.  She is taking Dilaudid every 3 hours.  Assessment and Plan:  1.  Metastatic left breast cancer, ER positive, PR negative, HER-2 negative: -She received first cycle of paclitaxel on 12/31/2019.  She was able to sleep well last night.  Reports that pain is well controlled.  Did not have any chemotherapy related side effects. -I discussed results of the liver biopsy with the patient.  This is consistent with metastatic breast cancer.  2.  Left breast and left upper extremity pain: -She is taking Dilaudid 2 mg every 3 hours which she will continue. -She was told to start taking stool softener and MiraLAX as needed.  3.  Right colonic mass: -She has canceled her appointment on 01/07/2020 with GI as it is her chemotherapy treatment day. -She is waiting for them to call back to reschedule appointment.   Follow Up Instructions: RTC monday   I  discussed the assessment and treatment plan with the patient. The patient was provided an opportunity to ask questions and all were answered. The patient agreed with the plan and demonstrated an understanding of the instructions.   The patient was advised to call back or seek an in-person evaluation if the symptoms worsen or if the condition fails to improve as anticipated.  I provided 12 minutes of non-face-to-face time during this encounter.   [name], MD

## 2020-01-02 NOTE — Telephone Encounter (Signed)
Called pt and made aware we currently do not have anything available and will call once we received our Sept schedule. Patient was scheduled as ASAP and I made her aware but still wants to r/s. Called endo and made aware. FYI to [facility]

## 2020-01-02 NOTE — Telephone Encounter (Signed)
Noted  

## 2020-01-02 NOTE — Telephone Encounter (Signed)
Pt called in and said that she has a procedure scheduled for 01/07/2020 but needs to be rescheduled due to chemo treatments.  Can you call her back at your convenience?  [phone]  Pt made aware that our Sept procedure schedules have not been released yet.

## 2020-01-03 LAB — CULTURE, BLOOD (ROUTINE X 2)
Culture: NO GROWTH
Culture: NO GROWTH
Special Requests: ADEQUATE
Special Requests: ADEQUATE

## 2020-01-04 ENCOUNTER — Other Ambulatory Visit (HOSPITAL_COMMUNITY)
Admission: RE | Admit: 2020-01-04 | Discharge: 2020-01-04 | Disposition: A | Discharge status: Home / Self Care | Payer: Medicare HMO | Source: Ambulatory Visit | Attending: Internal Medicine | Admitting: Internal Medicine

## 2020-01-07 ENCOUNTER — Inpatient Hospital Stay (HOSPITAL_COMMUNITY): Payer: Medicare HMO

## 2020-01-07 ENCOUNTER — Telehealth: Payer: Self-pay | Admitting: *Deleted

## 2020-01-07 ENCOUNTER — Encounter (HOSPITAL_COMMUNITY): Admission: RE | Payer: Self-pay | Source: Home / Self Care

## 2020-01-07 ENCOUNTER — Other Ambulatory Visit: Payer: Self-pay

## 2020-01-07 ENCOUNTER — Inpatient Hospital Stay (HOSPITAL_BASED_OUTPATIENT_CLINIC_OR_DEPARTMENT_OTHER): Payer: Medicare HMO | Admitting: Hematology

## 2020-01-07 ENCOUNTER — Ambulatory Visit (HOSPITAL_COMMUNITY): Admission: RE | Admit: 2020-01-07 | Payer: Medicare HMO | Source: Home / Self Care | Admitting: Internal Medicine

## 2020-01-07 VITALS — BP 136/72 | HR 97 | Temp 97.1°F | Resp 18 | Wt 283.9 lb

## 2020-01-07 VITALS — BP 108/73 | HR 88 | Temp 97.2°F | Resp 18

## 2020-01-07 DIAGNOSIS — Z95828 Presence of other vascular implants and grafts: Secondary | ICD-10-CM

## 2020-01-07 DIAGNOSIS — Z170 Estrogen receptor positive status [ER+]: Secondary | ICD-10-CM

## 2020-01-07 DIAGNOSIS — Z5111 Encounter for antineoplastic chemotherapy: Secondary | ICD-10-CM | POA: Diagnosis not present

## 2020-01-07 DIAGNOSIS — C50812 Malignant neoplasm of overlapping sites of left female breast: Secondary | ICD-10-CM

## 2020-01-07 LAB — COMPREHENSIVE METABOLIC PANEL
ALT: 30 U/L (ref 0–44)
AST: 37 U/L (ref 15–41)
Albumin: 3.4 g/dL — ABNORMAL LOW (ref 3.5–5.0)
Alkaline Phosphatase: 91 U/L (ref 38–126)
Anion gap: 10 (ref 5–15)
BUN: 19 mg/dL (ref 8–23)
CO2: 26 mmol/L (ref 22–32)
Calcium: 8.9 mg/dL (ref 8.9–10.3)
Chloride: 98 mmol/L (ref 98–111)
Creatinine, Ser: 0.72 mg/dL (ref 0.44–1.00)
GFR calc Af Amer: >60 mL/min (ref >60)
GFR calc non Af Amer: >60 mL/min (ref >60)
Glucose, Bld: 174 mg/dL — ABNORMAL HIGH (ref 70–99)
Potassium: 3.6 mmol/L (ref 3.5–5.1)
Sodium: 134 mmol/L — ABNORMAL LOW (ref 135–145)
Total Bilirubin: 0.6 mg/dL (ref 0.3–1.2)
Total Protein: 7.7 g/dL (ref 6.5–8.1)

## 2020-01-07 LAB — CBC WITH DIFFERENTIAL/PLATELET
Abs Immature Granulocytes: 0.03 10*3/uL (ref 0.00–0.07)
Basophils Absolute: 0.1 10*3/uL (ref 0.0–0.1)
Basophils Relative: 2 %
Eosinophils Absolute: 0.1 10*3/uL (ref 0.0–0.5)
Eosinophils Relative: 4 %
HCT: 36.3 % (ref 36.0–46.0)
Hemoglobin: 11.4 g/dL — ABNORMAL LOW (ref 12.0–15.0)
Immature Granulocytes: 1 %
Lymphocytes Relative: 43 %
Lymphs Abs: 1.7 10*3/uL (ref 0.7–4.0)
MCH: 26.6 pg (ref 26.0–34.0)
MCHC: 31.4 g/dL (ref 30.0–36.0)
MCV: 84.6 fL (ref 80.0–100.0)
Monocytes Absolute: 0.2 10*3/uL (ref 0.1–1.0)
Monocytes Relative: 6 %
Neutro Abs: 1.7 10*3/uL (ref 1.7–7.7)
Neutrophils Relative %: 44 %
Platelets: 254 10*3/uL (ref 150–400)
RBC: 4.29 MIL/uL (ref 3.87–5.11)
RDW: 13.2 % (ref 11.5–15.5)
WBC: 3.8 10*3/uL — ABNORMAL LOW (ref 4.0–10.5)
nRBC: 0 % (ref 0.0–0.2)

## 2020-01-07 SURGERY — COLONOSCOPY WITH PROPOFOL
Anesthesia: Monitor Anesthesia Care

## 2020-01-07 MED ORDER — FAMOTIDINE IN NACL 20-0.9 MG/50ML-% IV SOLN
20.0000 mg | Freq: Once | INTRAVENOUS | Status: AC
  Administered 2020-01-07: 20 mg via INTRAVENOUS
  Filled 2020-01-07: qty 50

## 2020-01-07 MED ORDER — SODIUM CHLORIDE 0.9 % IV SOLN
10.0000 mg | Freq: Once | INTRAVENOUS | Status: AC
  Administered 2020-01-07: 10 mg via INTRAVENOUS
  Filled 2020-01-07: qty 10

## 2020-01-07 MED ORDER — SODIUM CHLORIDE 0.9% FLUSH
10.0000 mL | INTRAVENOUS | Status: DC | PRN
  Administered 2020-01-07: 10 mL

## 2020-01-07 MED ORDER — SODIUM CHLORIDE 0.9 % IV SOLN: Freq: Once | INTRAVENOUS | Status: AC

## 2020-01-07 MED ORDER — SODIUM CHLORIDE 0.9 % IV SOLN
Freq: Once | INTRAVENOUS | Status: AC
  Administered 2020-01-07: 8 mg via INTRAVENOUS
  Filled 2020-01-07: qty 4

## 2020-01-07 MED ORDER — HYDROMORPHONE HCL 2 MG PO TABS: 2.0000 mg | ORAL_TABLET | ORAL | 0 refills | Status: DC | PRN

## 2020-01-07 MED ORDER — DIPHENHYDRAMINE HCL 50 MG/ML IJ SOLN
50.0000 mg | Freq: Once | INTRAMUSCULAR | Status: AC
  Administered 2020-01-07: 50 mg via INTRAVENOUS
  Filled 2020-01-07: qty 1

## 2020-01-07 MED ORDER — HEPARIN SOD (PORK) LOCK FLUSH 100 UNIT/ML IV SOLN
500.0000 [IU] | Freq: Once | INTRAVENOUS | Status: AC | PRN
  Administered 2020-01-07: 500 [IU]

## 2020-01-07 MED ORDER — CYANOCOBALAMIN 1000 MCG/ML IJ SOLN
INTRAMUSCULAR | Status: AC
  Filled 2020-01-07: qty 1

## 2020-01-07 MED ORDER — SODIUM CHLORIDE 0.9 % IV SOLN
80.0000 mg/m2 | Freq: Once | INTRAVENOUS | Status: AC
  Administered 2020-01-07: 192 mg via INTRAVENOUS
  Filled 2020-01-07: qty 32

## 2020-01-07 NOTE — Patient Instructions (Signed)
[facility] Discharge Instructions for Patients Receiving Chemotherapy  Today you received the following chemotherapy agents   To help prevent nausea and vomiting after your treatment, we encourage you to take your nausea medication   If you develop nausea and vomiting that is not controlled by your nausea medication, call the clinic.   BELOW ARE SYMPTOMS THAT SHOULD BE REPORTED IMMEDIATELY:  *FEVER GREATER THAN 100.5 F  *CHILLS WITH OR WITHOUT FEVER  NAUSEA AND VOMITING THAT IS NOT CONTROLLED WITH YOUR NAUSEA MEDICATION  *UNUSUAL SHORTNESS OF BREATH  *UNUSUAL BRUISING OR BLEEDING  TENDERNESS IN MOUTH AND THROAT WITH OR WITHOUT PRESENCE OF ULCERS  *URINARY PROBLEMS  *BOWEL PROBLEMS  UNUSUAL RASH Items with * indicate a potential emergency and should be followed up as soon as possible.  Feel free to call the clinic should you have any questions or concerns. The clinic phone number is [phone].  Please show the CHEMO ALERT CARD at check-in to the Emergency Department and triage nurse.   

## 2020-01-07 NOTE — Progress Notes (Signed)
Labs reviewed with Dr Raliegh Ip.  Faythe Ghee to proceed with treatment.   Cristine Polio tolerated treatment well today without incidence. Vital signs WNL prior to discharge. Discharged via wheelchair with husband.

## 2020-01-07 NOTE — Telephone Encounter (Signed)
LMTCB to schedule TCS with Dr. Gala Romney with propofol

## 2020-01-07 NOTE — Patient Instructions (Signed)
[facility] at [facility] Discharge Instructions  You were seen today by Dr. Delton Coombes. He went over your recent results. You received your treatment today; your next treatment is next week. Dr. Delton Coombes will see you back in 3 weeks for labs and follow up.   Thank you for choosing [facility] to provide your oncology and hematology care.  To afford each patient quality time with our provider, please arrive at least 15 minutes before your scheduled appointment time.   If you have a lab appointment with the [facility] please come in thru the Main Entrance and check in at the main information desk  You need to re-schedule your appointment should you arrive 10 or more minutes late.  We strive to give you quality time with our providers, and arriving late affects you and other patients whose appointments are after yours.  Also, if you no show three or more times for appointments you may be dismissed from the clinic at the providers discretion.     Again, thank you for choosing [facility].  Our hope is that these requests will decrease the amount of time that you wait before being seen by our physicians.       _____________________________________________________________  Should you have questions after your visit to [facility], please contact our office at [phone] between the hours of 8:00 a.m. and 4:30 p.m.  Voicemails left after 4:00 p.m. will not be returned until the following business day.  For prescription refill requests, have your pharmacy contact our office and allow 72 hours.    Cancer Center Support Programs:   > Cancer Support Group  2nd Tuesday of the month 1pm-2pm, Journey Room

## 2020-01-07 NOTE — Progress Notes (Signed)
[place] [place], [place] [address]   CLINIC:  Medical Oncology/Hematology  PCP:  Lemmie Evens, MD [place] [facility] [place] [address] [phone]   REASON FOR VISIT:  Follow-up for left breast cancer and right colon cancer  PRIOR THERAPY: None  NGS Results: Not done  CURRENT THERAPY: Paclitaxel  BRIEF ONCOLOGIC HISTORY:  Oncology History  Malignant neoplasm of overlapping sites of left breast in female, estrogen receptor positive ([place])  11/16/2019 Cancer Staging   Staging form: Breast, AJCC 8th Edition - Clinical stage from 11/16/2019: Stage IV (cT4b, cN1, cM1, G3, ER+, PR-, HER2-) - Signed by [name], MD on 12/17/2019   11/28/2019 Initial Diagnosis   Patient noted intermittent left breast pain and swelling for several months. Mammogram showed a 6.2cm mass in the left breast axillary tail with an adjacent 0.9cm mass, 2 abnormal left axillary lymph nodes, 1 abnormal right axillary lymph node, and diffuse left breast edema and skin thickening. Biopsy showed IDC, grade 3 in the left breast and bilateral axillas, HER-2 negative (1+), ER+ 100%, PR- 0%, Ki67 30%.    12/31/2019 -  Chemotherapy   The patient had ondansetron (ZOFRAN) 8 mg in sodium chloride 0.9 % 50 mL IVPB, , Intravenous,  Once, 1 of 4 cycles Administration: 8 mg (12/31/2019) PACLitaxel (TAXOL) 192 mg in sodium chloride 0.9 % 250 mL chemo infusion (</= 31m/m2), 80 mg/m2 = 192 mg, Intravenous,  Once, 1 of 4 cycles Administration: 192 mg (12/31/2019)  for chemotherapy treatment.    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 female, returns for routine follow-up and consideration for next cycle of chemotherapy. AOzellwas last seen on 12/31/2019.  Due for day #8 of cycle #1 of paclitaxel today.   Overall, today she tells me she has been feeling pretty well. She reports having discomfort in her left breast on the lateral side radiating into the armpit, which has not improved with chemo. She is still her antibiotic for her UTI which has been asymptomatic so far; she has 6 tablets left. She is taking the Dilaudid every 3 hours until the pain subsides, generally 2 tablets per day and she is getting slightly constipated. She started taking stool softener yesterday. She denies having N/V since her last treatment and her appetite is good. She denies numbness or tingling.  She will have her colonoscopy rescheduled.   Overall, she feels ready for next cycle of chemo today.    REVIEW OF SYSTEMS:  Review of Systems  Constitutional: Positive for appetite change (moderately decreased) and fatigue (moderate).  Cardiovascular:  Positive for chest pain (3/10 L breast discomfort). Negative for leg swelling.  Gastrointestinal: Positive for constipation. Negative for nausea and vomiting.  Neurological: Negative for numbness.  All other systems reviewed and are negative.   PAST MEDICAL/SURGICAL HISTORY:  Past Medical History:  Diagnosis Date  . Anemia   . Arthritis    per  patient " left knee"  . Essential hypertension, benign   . Metastatic breast cancer ([place])    left breast  . Port-A-Cath in place 12/25/2019  . Type 2 diabetes mellitus ([place])    Past Surgical History:  Procedure Laterality Date  . CATARACT EXTRACTION W/ INTRAOCULAR LENS IMPLANT Right   . HYSTEROSCOPY WITH D & C N/A 06/05/2014   Procedure: DILATATION AND CURETTAGE /HYSTEROSCOPY;  Surgeon: Florian Buff, MD;  Location: [facility];  Service: Gynecology;  Laterality: N/A;  . POLYPECTOMY N/A 06/05/2014   Procedure: ENDOMETRIAL POLYPECTOMY;  Surgeon: Florian Buff, MD;  Location: [facility];  Service: Gynecology;  Laterality: N/A;  . PORTACATH PLACEMENT N/A 12/14/2019   Procedure: INSERTION PORT-A-CATH WITH ULTRASOUND GUIDANCE;  Surgeon: Donnie Mesa, MD;  Location: [facility];  Service: General;  Laterality: N/A;  . TUBAL LIGATION      SOCIAL HISTORY:  Social History   Socioeconomic History  . Marital status: Married    Spouse name: Herbie Baltimore  . Number of children: 2  . Years of education: Not on file  . Highest education level: Not on file  Occupational History  . Occupation: retired  Tobacco Use  . Smoking status: Former Smoker    Types: Cigarettes  . Smokeless tobacco: Never Used  . Tobacco comment: quit about 40+ years ago  Vaping Use  . Vaping Use: Never used  Substance and Sexual Activity  . Alcohol use: Never  . Drug use: No  . Sexual activity: Not on file  Other Topics Concern  . Not on file  Social History Narrative  . Not on file   Social Determinants of Health   Financial Resource Strain: Low Risk   . Difficulty of Paying Living Expenses: Not hard at all  Food Insecurity: No Food Insecurity  . Worried About Charity fundraiser in the Last Year: Never true  . Ran Out of Food in the Last Year: Never true  Transportation Needs: No Transportation Needs  . Lack of Transportation (Medical): No  . Lack of Transportation (Non-Medical): No  Physical Activity: Inactive  . Days of  Exercise per Week: 0 days  . Minutes of Exercise per Session: 0 min  Stress: No Stress Concern Present  . Feeling of Stress : Only a little  Social Connections: Moderately Integrated  . Frequency of Communication with Friends and Family: Three times a week  . Frequency of Social Gatherings with Friends and Family: Three times a week  . Attends Religious Services: 1 to 4 times per year  . Active Member of Clubs or Organizations: No  . Attends Archivist Meetings: Never  . Marital Status: Married  Human resources officer Violence: Not At Risk  . Fear of Current or Ex-Partner: No  . Emotionally Abused: No  . Physically Abused: No  . Sexually Abused: No    FAMILY HISTORY:  Family History  Problem Relation Age of Onset  . Diabetes Mellitus II Father   . Cancer Mother        Deceased at age 80; patient not sure if cancer was in her stomach or colon    CURRENT MEDICATIONS:  Current  Outpatient Medications  Medication Sig Dispense Refill  . cephALEXin (KEFLEX) 500 MG capsule Take 1 capsule (500 mg total) by mouth 3 (three) times daily. 30 capsule 0  . ferrous sulfate 325 (65 FE) MG tablet Take 325 mg by mouth daily with breakfast.    . hydrochlorothiazide (HYDRODIURIL) 25 MG tablet Take 25 mg by mouth daily.    Marland Kitchen losartan (COZAAR) 50 MG tablet Take 50 mg by mouth daily.    . metFORMIN (GLUCOPHAGE) 500 MG tablet Take 500 mg by mouth daily with breakfast.     . oxybutynin (DITROPAN) 5 MG tablet Take 5 mg by mouth daily.     Marland Kitchen PACLitaxel (TAXOL IV) Inject 80 mg/m2 into the vein once a week. Day 1, 8, 15 every 28 days    . potassium chloride (MICRO-K) 10 MEQ CR capsule Take 10 mEq by mouth daily.     . pravastatin (PRAVACHOL) 10 MG tablet Take 10 mg by mouth daily.     Marland Kitchen HYDROcodone-acetaminophen (NORCO/VICODIN) 5-325 MG tablet Take 1 tablet by mouth every 6 (six) hours as needed for moderate pain. (Patient not taking: Reported on 01/07/2020) 15 tablet 0  . HYDROmorphone (DILAUDID) 2 MG  tablet Take 1 tablet (2 mg total) by mouth every 4 (four) hours as needed for severe pain. (Patient not taking: Reported on 01/07/2020) 30 tablet 0  . ibuprofen (ADVIL,MOTRIN) 800 MG tablet Take 800 mg by mouth every 8 (eight) hours as needed for moderate pain.  (Patient not taking: Reported on 01/07/2020)    . lidocaine-prilocaine (EMLA) cream Apply a small amount to port a cath site and cover with plastic wrap 1 hour prior to chemotherapy appointments (Patient not taking: Reported on 01/07/2020) 30 g 3  . prochlorperazine (COMPAZINE) 10 MG tablet Take 1 tablet (10 mg total) by mouth every 6 (six) hours as needed (Nausea or vomiting). (Patient not taking: Reported on 01/07/2020) 30 tablet 1   No current facility-administered medications for this visit.    ALLERGIES:  No Known Allergies  PHYSICAL EXAM:  Performance status (ECOG): 1 - Symptomatic but completely ambulatory  Vitals:   01/07/20 0825  BP: 136/72  Pulse: 97  Resp: 18  Temp: (!) 97.1 F (36.2 C)  SpO2: 99%   Wt Readings from Last 3 Encounters:  01/07/20 283 lb 14.4 oz (128.8 kg)  12/31/19 (!) 290 lb (131.5 kg)  12/28/19 (!) 290 lb (131.5 kg)   Physical Exam Vitals reviewed.  Constitutional:      Appearance: Normal appearance. She is obese.  Cardiovascular:     Rate and Rhythm: Normal rate and regular rhythm.     Pulses: Normal pulses.     Heart sounds: Normal heart sounds.  Pulmonary:     Effort: Pulmonary effort is normal.     Breath sounds: Normal breath sounds.  Chest:     Comments: Port-a-Cath on R chest Musculoskeletal:     Left upper arm: Tenderness (TTP in L armpit and mid-arm) present.     Right lower leg: No edema.     Left lower leg: No edema.  Lymphadenopathy:     Cervical: No cervical adenopathy.  Neurological:     General: No focal deficit present.     Mental Status: She is alert and oriented to person, place, and time.  Psychiatric:        Mood and Affect: Mood normal.        Behavior: Behavior  normal.     LABORATORY DATA:  I have  reviewed the labs as listed.  CBC Latest Ref Rng & Units 01/07/2020 12/31/2019 12/29/2019  WBC 4.0 - 10.5 K/uL 3.8(L) 6.8 13.2(H)  Hemoglobin 12.0 - 15.0 g/dL 11.4(L) 11.1(L) 12.2  Hematocrit 36 - 46 % 36.3 34.6(L) 37.7  Platelets 150 - 400 K/uL 254 213 239   CMP Latest Ref Rng & Units 12/31/2019 12/29/2019 12/10/2019  Glucose 70 - 99 mg/dL 204(H) 170(H) 119(H)  BUN 8 - 23 mg/dL 14 18 16   Creatinine 0.44 - 1.00 mg/dL 0.79 0.79 0.68  Sodium 135 - 145 mmol/L 135 134(L) 136  Potassium 3.5 - 5.1 mmol/L 3.4(L) 3.8 4.0  Chloride 98 - 111 mmol/L 101 100 101  CO2 22 - 32 mmol/L 25 24 25   Calcium 8.9 - 10.3 mg/dL 8.6(L) 8.6(L) 9.0  Total Protein 6.5 - 8.1 g/dL 7.2 8.2(H) 7.8  Total Bilirubin 0.3 - 1.2 mg/dL 0.5 0.6 0.5  Alkaline Phos 38 - 126 U/L 64 76 73  AST 15 - 41 U/L 28 37 31  ALT 0 - 44 U/L 17 21 18     DIAGNOSTIC IMAGING:  I have independently reviewed the scans and discussed with the patient. CT Chest W Contrast  Result Date: 12/29/2019 CLINICAL DATA:  Chest pain and shortness of breath, metastatic breast cancer, liver ultrasound prior day EXAM: CT CHEST WITH CONTRAST TECHNIQUE: Multidetector CT imaging of the chest was performed during intravenous contrast administration. CONTRAST:  129m OMNIPAQUE IOHEXOL 300 MG/ML  SOLN COMPARISON:  December 12, 2019 FINDINGS: Cardiovascular: There are no significant vascular findings. The heart size is normal. There is no pericardial thickening or effusion. Mediastinum/Nodes: Again noted is extensive skin thickening of the left breast wall with a heterogeneous hypodense mass along the left chest wall measuring 6.7 x 5.2 cm. Extensive surrounding left chest wall and axillary adenopathy is again identified. There is also extensive left supraclavicular adenopathy. Adenopathy is noted within the left pre vascular space and anterior mediastinum. This appears grossly unchanged from the prior exam. The trachea, thyroid, and  esophagus are grossly unremarkable. Lungs/Pleura: There are no areas consolidation. No suspicious pulmonary nodules. There is no pleural effusion. Musculoskeletal/Chest wall: There is a lytic lesion again identified in the posterior T4 vertebral body best seen on series 2, image 17. Abdomen/pelvis: Hepatobiliary: The multiple small hypodense lesions throughout the liver are not well seen on this examination comparison to prior. The gallbladder is unremarkable. The portal vein is patent. Pancreas: Unremarkable. No pancreatic ductal dilatation or surrounding inflammatory changes. Spleen: Normal in size without focal abnormality. Adrenals/Urinary Tract: Both adrenal glands appear normal. Again noted is a 1 cm low-density lesion seen in the upper pole of the right kidney. There appears to be mild right upper pole perinephric stranding. There is also a small hypodense lesion in the lower pole the left kidney. Bladder is unremarkable. Stomach/Bowel: The stomach and small bowel are unremarkable. The heterogeneous circumferential wall thickening seen within the right colon at the hepatic flexure with mild surrounding fat stranding changes is again identified. The remainder of the colon is grossly unremarkable. There is scattered colonic diverticula. The appendix is unremarkable. Vascular/Lymphatic: There are no enlarged mesenteric, retroperitoneal, or pelvic lymph nodes. Scattered aortic atherosclerotic calcifications are seen without aneurysmal dilatation. Reproductive: Large partially calcified uterine fibroids are present. Other: No evidence of abdominal wall mass or hernia. Musculoskeletal: Advanced left hip osteoarthritis is seen with a probable healed fracture deformity. IMPRESSION: 1. Unchanged left chest wall mass with diffuse skin thickening and extensive axillary, chest wall, mediastinal adenopathy. 2.  Mild right-sided perinephric stranding which is nonspecific, however could be due to pyelonephritis. 3. Small  hypodense liver lesions, not significantly changed since prior exam. 4. Circumferential wall thickening at the hepatic flexure, concerning for metastatic disease versus colonic primary. 5. Aortic Atherosclerosis (ICD10-I70.0). 6. Lytic lesion in the posterior T4 vertebral body, concerning for osseous metastases. Electronically Signed   By: Prudencio Pair M.D.   On: 12/29/2019 03:11   CT Chest W Contrast  Result Date: 12/12/2019 CLINICAL DATA:  Newly diagnosed left breast carcinoma.  Staging. EXAM: CT CHEST, ABDOMEN, AND PELVIS WITH CONTRAST TECHNIQUE: Multidetector CT imaging of the chest, abdomen and pelvis was performed following the standard protocol during bolus administration of intravenous contrast. CONTRAST:  153m OMNIPAQUE IOHEXOL 300 MG/ML  SOLN COMPARISON:  [facility] only CT on 03/15/2014 FINDINGS: CT CHEST FINDINGS Cardiovascular: No acute findings. Mediastinum/Lymph Nodes: A large mass is seen in the axillary tail of the left breast measuring 6.3 x 6.1 cm, likely representing primary breast carcinoma. Diffuse left breast skin thickening and soft tissue stranding is also demonstrated, consistent with carcinoma. Bulky lymphadenopathy is seen within the left subpectoral and axillary regions, with largest lymph node measuring 3.3 cm on image 12/2. Mild mediastinal lymphadenopathy is seen in the right paratracheal pre-vascular, and lateral aortic regions, with largest lymph node in the lateral aortic region measuring 2.4 cm on image 17/2. lymphadenopathy is seen in the lower neck in the inferior jugular and supraclavicular regions, left side greater than right. Mild lymphadenopathy is also seen in the right axilla, with largest lymph node measuring 11 mm on image 14/2. Lungs/Pleura: No pulmonary infiltrate or mass identified. No effusion present. Musculoskeletal:  No suspicious bone lesions identified. CT ABDOMEN AND PELVIS FINDINGS Hepatobiliary: Multiple small hypovascular lesions are seen in the right and left  lobes which are new since previous study and consistent with liver metastases. Index lesion in the medial dome of the right hepatic lobe measures 1.7 x 1.3 cm on image 35/2. Gallbladder is unremarkable. No evidence of biliary ductal dilatation. Pancreas:  No mass or inflammatory changes. Spleen:  Within normal limits in size and appearance. Adrenals/Urinary tract: Normal adrenal glands. A few small bilateral renal cysts are noted. No masses or hydronephrosis. Stomach/Bowel: An annular constricting mass is seen involving the ascending colon which measures 3.6 x 3.5 cm (image 57/2), highly suspicious for primary colon carcinoma. Small less than 1 cm right pericolonic lymph nodes are consistent with local lymph node metastases. No evidence of bowel obstruction. Vascular/Lymphatic: No pathologically enlarged lymph nodes identified. No abdominal aortic aneurysm. Reproductive: Several partially calcified uterine fibroids are again seen, largest measuring approximately 4 cm. Adnexal regions are unremarkable. Other:  None. Musculoskeletal:  No suspicious bone lesions identified. IMPRESSION: 6.3 cm mass in axillary tail of left breast, with diffuse left breast skin thickening and soft tissue stranding, consistent with primary breast carcinoma. Bulky left subpectoral, axillary, and mediastinal lymphadenopathy, consistent with metastatic disease. Lymphadenopathy also seen in the lower neck, left side greater than right, and mildly enlarged lymph node in the right axilla. Multiple small liver metastases. Annular constricting mass in the ascending colon, highly suspicious for primary colon carcinoma. Adjacent sub-cm right pericolonic lymph nodes, consistent with local lymph node metastases. Colonoscopy is recommended. Uterine fibroids. Electronically Signed   By: JMarlaine HindM.D.   On: 12/12/2019 15:45   NM Bone Scan Whole Body  Result Date: 12/12/2019 CLINICAL DATA:  Breast cancer.  New diagnosis.  Staging. EXAM: NUCLEAR  MEDICINE WHOLE BODY BONE SCAN  TECHNIQUE: Whole body anterior and posterior images were obtained approximately 3 hours after intravenous injection of radiopharmaceutical. RADIOPHARMACEUTICALS:  21.0 mCi Technetium-16mMDP IV COMPARISON:  CT 11/12/2019 FINDINGS: No foci of radiotracer within the axillary or appendicular skeleton to localize breast cancer metastasis. Uptake in the proximal LEFT femur is favored related to severe degenerative change seen on comparison CT. Degenerative uptake noted in the knees. Degenerate uptake noted in the SI joints. Inflammatory change of the LEFT breast. IMPRESSION: No evidence skeletal metastasis. Electronically Signed   By: SSuzy BouchardM.D.   On: 12/12/2019 17:16   CT Abdomen Pelvis W Contrast  Result Date: 12/29/2019 CLINICAL DATA:  Chest pain and shortness of breath, metastatic breast cancer, liver ultrasound prior day EXAM: CT CHEST WITH CONTRAST TECHNIQUE: Multidetector CT imaging of the chest was performed during intravenous contrast administration. CONTRAST:  1065mOMNIPAQUE IOHEXOL 300 MG/ML  SOLN COMPARISON:  December 12, 2019 FINDINGS: Cardiovascular: There are no significant vascular findings. The heart size is normal. There is no pericardial thickening or effusion. Mediastinum/Nodes: Again noted is extensive skin thickening of the left breast wall with a heterogeneous hypodense mass along the left chest wall measuring 6.7 x 5.2 cm. Extensive surrounding left chest wall and axillary adenopathy is again identified. There is also extensive left supraclavicular adenopathy. Adenopathy is noted within the left pre vascular space and anterior mediastinum. This appears grossly unchanged from the prior exam. The trachea, thyroid, and esophagus are grossly unremarkable. Lungs/Pleura: There are no areas consolidation. No suspicious pulmonary nodules. There is no pleural effusion. Musculoskeletal/Chest wall: There is a lytic lesion again identified in the posterior T4  vertebral body best seen on series 2, image 17. Abdomen/pelvis: Hepatobiliary: The multiple small hypodense lesions throughout the liver are not well seen on this examination comparison to prior. The gallbladder is unremarkable. The portal vein is patent. Pancreas: Unremarkable. No pancreatic ductal dilatation or surrounding inflammatory changes. Spleen: Normal in size without focal abnormality. Adrenals/Urinary Tract: Both adrenal glands appear normal. Again noted is a 1 cm low-density lesion seen in the upper pole of the right kidney. There appears to be mild right upper pole perinephric stranding. There is also a small hypodense lesion in the lower pole the left kidney. Bladder is unremarkable. Stomach/Bowel: The stomach and small bowel are unremarkable. The heterogeneous circumferential wall thickening seen within the right colon at the hepatic flexure with mild surrounding fat stranding changes is again identified. The remainder of the colon is grossly unremarkable. There is scattered colonic diverticula. The appendix is unremarkable. Vascular/Lymphatic: There are no enlarged mesenteric, retroperitoneal, or pelvic lymph nodes. Scattered aortic atherosclerotic calcifications are seen without aneurysmal dilatation. Reproductive: Large partially calcified uterine fibroids are present. Other: No evidence of abdominal wall mass or hernia. Musculoskeletal: Advanced left hip osteoarthritis is seen with a probable healed fracture deformity. IMPRESSION: 1. Unchanged left chest wall mass with diffuse skin thickening and extensive axillary, chest wall, mediastinal adenopathy. 2. Mild right-sided perinephric stranding which is nonspecific, however could be due to pyelonephritis. 3. Small hypodense liver lesions, not significantly changed since prior exam. 4. Circumferential wall thickening at the hepatic flexure, concerning for metastatic disease versus colonic primary. 5. Aortic Atherosclerosis (ICD10-I70.0). 6. Lytic  lesion in the posterior T4 vertebral body, concerning for osseous metastases. Electronically Signed   By: BiPrudencio Pair.D.   On: 12/29/2019 03:11   CT Abdomen Pelvis W Contrast  Result Date: 12/12/2019 CLINICAL DATA:  Newly diagnosed left breast carcinoma.  Staging. EXAM: CT CHEST, ABDOMEN, AND  PELVIS WITH CONTRAST TECHNIQUE: Multidetector CT imaging of the chest, abdomen and pelvis was performed following the standard protocol during bolus administration of intravenous contrast. CONTRAST:  166m OMNIPAQUE IOHEXOL 300 MG/ML  SOLN COMPARISON:  [facility] only CT on 03/15/2014 FINDINGS: CT CHEST FINDINGS Cardiovascular: No acute findings. Mediastinum/Lymph Nodes: A large mass is seen in the axillary tail of the left breast measuring 6.3 x 6.1 cm, likely representing primary breast carcinoma. Diffuse left breast skin thickening and soft tissue stranding is also demonstrated, consistent with carcinoma. Bulky lymphadenopathy is seen within the left subpectoral and axillary regions, with largest lymph node measuring 3.3 cm on image 12/2. Mild mediastinal lymphadenopathy is seen in the right paratracheal pre-vascular, and lateral aortic regions, with largest lymph node in the lateral aortic region measuring 2.4 cm on image 17/2. lymphadenopathy is seen in the lower neck in the inferior jugular and supraclavicular regions, left side greater than right. Mild lymphadenopathy is also seen in the right axilla, with largest lymph node measuring 11 mm on image 14/2. Lungs/Pleura: No pulmonary infiltrate or mass identified. No effusion present. Musculoskeletal:  No suspicious bone lesions identified. CT ABDOMEN AND PELVIS FINDINGS Hepatobiliary: Multiple small hypovascular lesions are seen in the right and left lobes which are new since previous study and consistent with liver metastases. Index lesion in the medial dome of the right hepatic lobe measures 1.7 x 1.3 cm on image 35/2. Gallbladder is unremarkable. No evidence of biliary  ductal dilatation. Pancreas:  No mass or inflammatory changes. Spleen:  Within normal limits in size and appearance. Adrenals/Urinary tract: Normal adrenal glands. A few small bilateral renal cysts are noted. No masses or hydronephrosis. Stomach/Bowel: An annular constricting mass is seen involving the ascending colon which measures 3.6 x 3.5 cm (image 57/2), highly suspicious for primary colon carcinoma. Small less than 1 cm right pericolonic lymph nodes are consistent with local lymph node metastases. No evidence of bowel obstruction. Vascular/Lymphatic: No pathologically enlarged lymph nodes identified. No abdominal aortic aneurysm. Reproductive: Several partially calcified uterine fibroids are again seen, largest measuring approximately 4 cm. Adnexal regions are unremarkable. Other:  None. Musculoskeletal:  No suspicious bone lesions identified. IMPRESSION: 6.3 cm mass in axillary tail of left breast, with diffuse left breast skin thickening and soft tissue stranding, consistent with primary breast carcinoma. Bulky left subpectoral, axillary, and mediastinal lymphadenopathy, consistent with metastatic disease. Lymphadenopathy also seen in the lower neck, left side greater than right, and mildly enlarged lymph node in the right axilla. Multiple small liver metastases. Annular constricting mass in the ascending colon, highly suspicious for primary colon carcinoma. Adjacent sub-cm right pericolonic lymph nodes, consistent with local lymph node metastases. Colonoscopy is recommended. Uterine fibroids. Electronically Signed   By: JMarlaine HindM.D.   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 LABS:  None performed today EXAM: BILATERAL BREAST MRI WITH AND WITHOUT CONTRAST TECHNIQUE: Multiplanar,  multisequence MR images of both breasts were obtained prior to and following the intravenous administration of 10 ml of Gadavist Three-dimensional MR images were rendered by post-processing of the original MR data on an independent workstation. The three-dimensional MR images were interpreted, and findings are reported in the following complete MRI report for this study. Three dimensional images were evaluated at the independent Dyna[employer] workstation COMPARISON:  Prior mammograms and  ultrasounds FINDINGS: Please note that this study is slightly limited technically due to loss of signal posteriorly and some motion artifact. Breast composition: b. Scattered fibroglandular tissue. Background parenchymal enhancement: Mild Right breast: There is mild skin thickening along the very far MEDIAL RIGHT breast which appears to extend from the LEFT side. No other mass or suspicious enhancing abnormality noted within the RIGHT breast. Left breast: A irregular mass containing biopsy clip artifact is identified within the far posterior OUTER RIGHT breast, at least 5.2 x 8.5 cm, but difficult to accurately measure due to far posterior location and adjacent abnormal lymph nodes. This mass abuts the pectoralis musculature. Diffuse skin thickening of the entire LEFT breast is identified with patchy areas of dermal enhancement. Suspicious patchy and stippled nonmasslike enhancement within all 4 quadrants of the LEFT breast identified. This abnormal nonmasslike enhancement extends just across the midline in the presternal fat to the RIGHT and abuts the LEFT pectoralis muscle. Lymph nodes: Markedly enlarged LEFT axillary lymph nodes are noted with at least 7 abnormal appearing LEFT axillary lymph nodes. A single enlarged abnormal RIGHT axillary lymph node with biopsy clip artifact is identified. At least 2 enlarged internal mammary lymph nodes on each side are noted. Ancillary findings:  None. IMPRESSION: 1. Multi centric LEFT breast  malignancy involving all 4 quadrants of and skin of the LEFT breast, including a 0.5 cm biopsy-proven malignancy within the far posterior OUTER RIGHT breast,, abnormal highly suspicious non masslike enhancement within all 4 quadrants of the LEFT breast and enhancing diffuse LEFT breast skin thickening. This abnormal non masslike enhancement and skin thickening extends across the midline to the far MEDIAL RIGHT breast. Biopsy-proven malignancy and abnormal non masslike enhancement also abuts the LEFT pectoralis musculature. At least 7 abnormal LEFT axillary lymph nodes and 1 abnormal RIGHT axillary lymph node, compatible with metastatic disease to axillary lymph nodes. RECOMMENDATION: Treatment plan. BI-RADS CATEGORY  6: Known biopsy-proven malignancy. Electronically Signed   By: Margarette Canada M.D.   On: 12/10/2019 12:17   US BIOPSY (LIVER)  Result Date: 12/28/2019 INDICATION: History of breast cancer also with annular lesion involving the ascending colon worrisome colon cancer. Patient also found to have indeterminate liver lesions worrisome for metastatic disease and as such presents now for image guided liver lesion biopsy. EXAM: ULTRASOUND GUIDED LIVER LESION BIOPSY COMPARISON:  CT the chest, abdomen and pelvis-12/12/2019 MEDICATIONS: None ANESTHESIA/SEDATION: Fentanyl  mcg IV; Versed  mg IV Total Moderate Sedation time:   Minutes. The patient's level of consciousness and vital signs were monitored continuously by radiology nursing throughout the procedure under my direct supervision. COMPLICATIONS: None immediate. PROCEDURE: Informed written consent was obtained from the patient after a discussion of the risks, benefits and alternatives to treatment. The patient understands and consents the procedure. A timeout was performed prior to the initiation of the procedure. Ultrasound scanning was performed of the right upper abdominal quadrant demonstrates multiple hypoechoic nodules scattered throughout the right  lobe of the liver. A dominant approximately 1.6 x 1.4 cm hypoechoic lesion within the subcapsular aspect of the right lobe of the liver was targeted for biopsy given lesion location and sonographic window. The procedure was planned. The right upper abdominal quadrant was prepped and draped in the usual sterile fashion. The overlying soft tissues were anesthetized with 1% lidocaine with epinephrine. A 17 gauge, 6.8 cm co-axial needle was advanced into a peripheral aspect of the lesion. This was followed by 6 core biopsies with an 18 gauge core device under direct  ultrasound guidance. The coaxial needle tract was embolized with a small amount of Gel-Foam slurry and superficial hemostasis was obtained with manual compression. Post procedural scanning was negative for definitive area of hemorrhage or additional complication. A dressing was placed. The patient tolerated the procedure well without immediate post procedural complication. IMPRESSION: Technically successful ultrasound guided core needle biopsy of indeterminate lesion within the right lobe of the liver. Electronically Signed   By: Sandi Mariscal M.D.   On: 12/28/2019 09:16   DG CHEST PORT 1 VIEW  Result Date: 12/14/2019 CLINICAL DATA:  Port-A-Cath placement.  Metastatic breast cancer. EXAM: PORTABLE CHEST 1 VIEW COMPARISON:  CT 12/12/2019.  Radiographs 09/30/2013. FINDINGS: 0846 hours. New right IJ Port-A-Cath extends to the level of the mid right atrium. The heart size and mediastinal contours are stable with aortic atherosclerosis. There is chronic elevation of the right hemidiaphragm with mildly increased bibasilar atelectasis. No pneumothorax, edema or significant pleural effusion. IMPRESSION: New right IJ Port-A-Cath placement with mildly increased bibasilar atelectasis. No pneumothorax. Electronically Signed   By: Richardean Sale M.D.   On: 12/14/2019 09:15   DG Fluoro Guide CV Line-No Report  Result Date: 12/14/2019 Fluoroscopy was utilized by the  requesting physician.  No radiographic interpretation.     ASSESSMENT:  1. 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 -MRI of the breast on 12/10/2019 shows left breast malignancy involving all 4 quadrants and skin of the left breast including 0.5 cm biopsy-proven malignancy within the far posterior outer right breast, abnormal highly suspicious nonmuscle like enhancement within all 4 quadrants of the left breast and diffuse skin thickening. This abnormal non-mass-like enhancement and skin thickening extends across the midline to the medial right breast. At least 7 abnormal left axillary lymph nodes and 1 abnormal right axillary lymph node compatible with metastatic disease. -CT CAP on 12/12/2019 shows bulky lymphadenopathy in the left subpectoral and axillary regions with largest lymph node measuring 3.3 cm. Mild mediastinal adenopathy in the right paratracheal prevascular and lateral aortic regions, largest lymph node in the lateral aortic region measuring 2.4 cm. Lymphadenopathy in the inferior jugular and supraclavicular regions, left side greater than right. Mild lymphadenopathy in the right axilla. -Multiple small hypervascular lesions seen in the right and left lobesconsistent with liver metastasis. -Bone scan on 12/12/2019 did not show any bone metastasis. -CT CAP on 12/29/2019 showed possible lytic lesion in the T4 vertebral body. -Paclitaxel weekly 3 weeks on 1 week off started on 12/31/2019.  2. Right colonic mass: -CT scan showed constricting mass involving the ascending colon measuring 3.6 x 3.5 cm with a small 1 cm right pericolonic lymph node. -She never had a colonoscopy. Denies any bleeding per rectum or melena.   PLAN:  1. Metastatic left breast cancer: -She has  tolerated first dose of paclitaxel last week very well.  Did not have any chemotherapy related side effects other than constipation. -We reviewed her labs today which showed white count of 3.8 with ANC of 1.7.  Platelet count was normal.  LFTs were normal. -She will proceed with day 8 of paclitaxel today and day 15 next week.  I plan to see her back in 3 weeks for follow-up prior to cycle 2.  2. Right colonic mass: -She will follow up with GI for rescheduling colonoscopy.  3.  Left breast  and left upper extremity pain: -Pain has slightly improved after her first cycle of paclitaxel. -She will continue Dilaudid 2 mg every 4 hours as needed.  I have sent another refill today.   Orders placed this encounter:  No orders of the defined types were placed in this encounter.    [name], MD [place] [phone]   I, Milinda Antis, am acting as a scribe for Dr. Sanda Linger.  I, [name] MD, have reviewed the above documentation for accuracy and completeness, and I agree with the above.

## 2020-01-07 NOTE — Progress Notes (Signed)
Patient has been assessed by Dr. Delton Coombes, labs reviewed and is okay for treatment.

## 2020-01-08 NOTE — Telephone Encounter (Signed)
Letter mailed

## 2020-01-10 ENCOUNTER — Ambulatory Visit (HOSPITAL_COMMUNITY): Payer: Medicare HMO | Admitting: Genetic Counselor

## 2020-01-10 ENCOUNTER — Inpatient Hospital Stay (HOSPITAL_COMMUNITY): Payer: Medicare HMO

## 2020-01-14 ENCOUNTER — Inpatient Hospital Stay (HOSPITAL_COMMUNITY): Payer: Medicare HMO

## 2020-01-14 ENCOUNTER — Inpatient Hospital Stay (HOSPITAL_BASED_OUTPATIENT_CLINIC_OR_DEPARTMENT_OTHER): Payer: Medicare HMO | Admitting: Hematology

## 2020-01-14 ENCOUNTER — Other Ambulatory Visit: Payer: Self-pay

## 2020-01-14 VITALS — BP 112/58 | HR 90 | Temp 98.4°F | Resp 18

## 2020-01-14 VITALS — BP 128/72 | HR 113 | Temp 96.9°F | Resp 19 | Wt 283.6 lb

## 2020-01-14 DIAGNOSIS — C50812 Malignant neoplasm of overlapping sites of left female breast: Secondary | ICD-10-CM

## 2020-01-14 DIAGNOSIS — Z5111 Encounter for antineoplastic chemotherapy: Secondary | ICD-10-CM | POA: Diagnosis not present

## 2020-01-14 DIAGNOSIS — Z95828 Presence of other vascular implants and grafts: Secondary | ICD-10-CM

## 2020-01-14 DIAGNOSIS — Z170 Estrogen receptor positive status [ER+]: Secondary | ICD-10-CM

## 2020-01-14 LAB — CBC WITH DIFFERENTIAL/PLATELET
Abs Immature Granulocytes: 0.03 10*3/uL (ref 0.00–0.07)
Basophils Absolute: 0 10*3/uL (ref 0.0–0.1)
Basophils Relative: 1 %
Eosinophils Absolute: 0.1 10*3/uL (ref 0.0–0.5)
Eosinophils Relative: 2 %
HCT: 35 % — ABNORMAL LOW (ref 36.0–46.0)
Hemoglobin: 11.2 g/dL — ABNORMAL LOW (ref 12.0–15.0)
Immature Granulocytes: 1 %
Lymphocytes Relative: 47 %
Lymphs Abs: 1.6 10*3/uL (ref 0.7–4.0)
MCH: 27.2 pg (ref 26.0–34.0)
MCHC: 32 g/dL (ref 30.0–36.0)
MCV: 85 fL (ref 80.0–100.0)
Monocytes Absolute: 0.2 10*3/uL (ref 0.1–1.0)
Monocytes Relative: 7 %
Neutro Abs: 1.4 10*3/uL — ABNORMAL LOW (ref 1.7–7.7)
Neutrophils Relative %: 42 %
Platelets: 270 10*3/uL (ref 150–400)
RBC: 4.12 MIL/uL (ref 3.87–5.11)
RDW: 13.3 % (ref 11.5–15.5)
WBC: 3.3 10*3/uL — ABNORMAL LOW (ref 4.0–10.5)
nRBC: 0 % (ref 0.0–0.2)

## 2020-01-14 LAB — COMPREHENSIVE METABOLIC PANEL
ALT: 25 U/L (ref 0–44)
AST: 30 U/L (ref 15–41)
Albumin: 3.3 g/dL — ABNORMAL LOW (ref 3.5–5.0)
Alkaline Phosphatase: 85 U/L (ref 38–126)
Anion gap: 8 (ref 5–15)
BUN: 19 mg/dL (ref 8–23)
CO2: 26 mmol/L (ref 22–32)
Calcium: 8.7 mg/dL — ABNORMAL LOW (ref 8.9–10.3)
Chloride: 98 mmol/L (ref 98–111)
Creatinine, Ser: 0.72 mg/dL (ref 0.44–1.00)
GFR calc Af Amer: >60 mL/min (ref >60)
GFR calc non Af Amer: >60 mL/min (ref >60)
Glucose, Bld: 216 mg/dL — ABNORMAL HIGH (ref 70–99)
Potassium: 3.5 mmol/L (ref 3.5–5.1)
Sodium: 132 mmol/L — ABNORMAL LOW (ref 135–145)
Total Bilirubin: 0.5 mg/dL (ref 0.3–1.2)
Total Protein: 7.4 g/dL (ref 6.5–8.1)

## 2020-01-14 MED ORDER — DIPHENHYDRAMINE HCL 50 MG/ML IJ SOLN
50.0000 mg | Freq: Once | INTRAMUSCULAR | Status: AC
  Administered 2020-01-14: 50 mg via INTRAVENOUS
  Filled 2020-01-14: qty 1

## 2020-01-14 MED ORDER — HEPARIN SOD (PORK) LOCK FLUSH 100 UNIT/ML IV SOLN
500.0000 [IU] | Freq: Once | INTRAVENOUS | Status: AC | PRN
  Administered 2020-01-14: 500 [IU]

## 2020-01-14 MED ORDER — SODIUM CHLORIDE 0.9 % IV SOLN
80.0000 mg/m2 | Freq: Once | INTRAVENOUS | Status: AC
  Administered 2020-01-14: 192 mg via INTRAVENOUS
  Filled 2020-01-14: qty 32

## 2020-01-14 MED ORDER — FAMOTIDINE IN NACL 20-0.9 MG/50ML-% IV SOLN
20.0000 mg | Freq: Once | INTRAVENOUS | Status: AC
  Administered 2020-01-14: 20 mg via INTRAVENOUS
  Filled 2020-01-14: qty 50

## 2020-01-14 MED ORDER — SODIUM CHLORIDE 0.9 % IV SOLN
Freq: Once | INTRAVENOUS | Status: AC
  Administered 2020-01-14: 8 mg via INTRAVENOUS
  Filled 2020-01-14: qty 4

## 2020-01-14 MED ORDER — PEGFILGRASTIM-CBQV 6 MG/0.6ML ~~LOC~~ SOSY
PREFILLED_SYRINGE | SUBCUTANEOUS | Status: AC
  Filled 2020-01-14: qty 0.6

## 2020-01-14 MED ORDER — SODIUM CHLORIDE 0.9% FLUSH
10.0000 mL | INTRAVENOUS | Status: DC | PRN
  Administered 2020-01-14: 10 mL

## 2020-01-14 MED ORDER — OCTREOTIDE ACETATE 30 MG IM KIT
PACK | INTRAMUSCULAR | Status: AC
  Filled 2020-01-14: qty 1

## 2020-01-14 MED ORDER — SODIUM CHLORIDE 0.9 % IV SOLN: Freq: Once | INTRAVENOUS | Status: AC

## 2020-01-14 MED ORDER — SODIUM CHLORIDE 0.9 % IV SOLN
10.0000 mg | Freq: Once | INTRAVENOUS | Status: AC
  Administered 2020-01-14: 10 mg via INTRAVENOUS
  Filled 2020-01-14: qty 10

## 2020-01-14 NOTE — Progress Notes (Signed)
Patient has been assessed by Dr. Delton Coombes and her labs reviewed. He is aware of elevated heart rate (100).  He is okay to proceed with treatment today.

## 2020-01-14 NOTE — Progress Notes (Signed)
[place],  [address]   CLINIC:  Medical Oncology/Hematology  PCP:  Lemmie Evens, MD [place] / [place] [address] [phone]   REASON FOR VISIT:  Follow-up for left breast cancer and right colon cancer  PRIOR THERAPY: None  NGS Results: Not done  CURRENT THERAPY: Paclitaxel  BRIEF ONCOLOGIC HISTORY:  Oncology History  Malignant neoplasm of overlapping sites of left breast in female, estrogen receptor positive ([place])  11/16/2019 Cancer Staging   Staging form: Breast, AJCC 8th Edition - Clinical stage from 11/16/2019: Stage IV (cT4b, cN1, cM1, G3, ER+, PR-, HER2-) - Signed by [name], MD on 12/17/2019   11/28/2019 Initial Diagnosis   Patient noted intermittent left breast pain and swelling for several months. Mammogram showed a 6.2cm mass in the left breast axillary tail with an adjacent 0.9cm mass, 2 abnormal left axillary lymph nodes, 1 abnormal right axillary lymph node, and diffuse left breast edema and skin thickening. Biopsy showed IDC, grade 3 in the left breast and bilateral axillas, HER-2 negative (1+), ER+ 100%, PR- 0%, Ki67 30%.    12/31/2019 -  Chemotherapy   The patient had ondansetron (ZOFRAN) 8 mg in sodium chloride 0.9 % 50 mL IVPB, , Intravenous,  Once, 1 of 4 cycles Administration: 8 mg (12/31/2019), 8 mg (01/07/2020) PACLitaxel (TAXOL) 192 mg in sodium chloride 0.9 % 250 mL chemo infusion (</= 27m/m2), 80 mg/m2 = 192 mg, Intravenous,  Once, 1 of 4 cycles Administration: 192 mg (12/31/2019), 192 mg (01/07/2020)  for chemotherapy treatment.    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 female, returns for routine follow-up and consideration for next cycle of chemotherapy. Crystal Vega last seen on 01/07/2020.  Due for day #15 of cycle #1 of paclitaxel today.   Overall, she tells me she has been feeling pretty well. Her left arm and breast are not as painful as before, and she takes Norco and Dilaudid as needed for the pain, generally BID. She denies numbness or tingling or palpitations.  Overall, she feels ready for next cycle of chemo today.    REVIEW OF SYSTEMS:  Review of Systems  Constitutional: Positive for appetite change (mildly decreased) and fatigue (depleted).  Respiratory: Positive for shortness of breath (w/ exertion).   Cardiovascular: Positive for chest pain (3/10 L breast and arm pain). Negative for leg swelling and palpitations.  Gastrointestinal: Positive for constipation.  Genitourinary: Positive for difficulty urinating (recent UTI).   Neurological: Negative for numbness.  Psychiatric/Behavioral: Positive for sleep disturbance.  All other systems reviewed and are negative.  PAST MEDICAL/SURGICAL HISTORY:  Past Medical History:  Diagnosis Date  . Anemia   . Arthritis    per patient " left knee"  . Essential hypertension, benign   . Metastatic breast cancer ([place])    left breast  . Port-A-Cath in place 12/25/2019  . Type 2 diabetes mellitus ([place])    Past  Surgical History:  Procedure Laterality Date  . CATARACT EXTRACTION W/ INTRAOCULAR LENS IMPLANT Right   . HYSTEROSCOPY WITH D & C N/A 06/05/2014   Procedure: DILATATION AND CURETTAGE /HYSTEROSCOPY;  Surgeon: Florian Buff, MD;  Location: [facility];  Service: Gynecology;  Laterality: N/A;  . POLYPECTOMY N/A 06/05/2014   Procedure: ENDOMETRIAL POLYPECTOMY;  Surgeon: Florian Buff, MD;  Location: [facility];  Service: Gynecology;  Laterality: N/A;  . PORTACATH PLACEMENT N/A 12/14/2019   Procedure: INSERTION PORT-A-CATH WITH ULTRASOUND GUIDANCE;  Surgeon: Donnie Mesa, MD;  Location: [facility];  Service: General;  Laterality: N/A;  . TUBAL LIGATION      SOCIAL HISTORY:  Social History   Socioeconomic History  . Marital status: Married    Spouse name: Herbie Baltimore  . Number of children: 2  . Years of education: Not on file  . Highest education level: Not on file  Occupational History  . Occupation: retired  Tobacco Use  . Smoking status: Former Smoker    Types: Cigarettes  . Smokeless tobacco: Never Used  . Tobacco comment: quit about 40+ years ago  Vaping Use  . Vaping Use: Never used  Substance and Sexual Activity  . Alcohol use: Never  . Drug use: No  . Sexual activity: Not on file  Other Topics Concern  . Not on file  Social History Narrative  . Not on file   Social Determinants of Health   Financial Resource Strain: Low Risk   . Difficulty of Paying Living Expenses: Not hard at all  Food Insecurity: No Food Insecurity  . Worried About Charity fundraiser in the Last Year: Never true  . Ran Out of Food in the Last Year: Never true  Transportation Needs: No Transportation Needs  . Lack of Transportation (Medical): No  . Lack of Transportation (Non-Medical): No  Physical Activity: Inactive  . Days of Exercise per Week: 0 days  . Minutes of Exercise per Session: 0 min  Stress: No Stress Concern Present  . Feeling of Stress : Only a little  Social Connections: Moderately Integrated  .  Frequency of Communication with Friends and Family: Three times a week  . Frequency of Social Gatherings with Friends and Family: Three times a week  . Attends Religious Services: 1 to 4 times per year  . Active Member of Clubs or Organizations: No  . Attends Archivist Meetings: Never  . Marital Status: Married  Human resources officer Violence: Not At Risk  . Fear of Current or Ex-Partner: No  . Emotionally Abused: No  . Physically Abused: No  . Sexually Abused: No    FAMILY HISTORY:  Family History  Problem Relation Age of Onset  . Diabetes Mellitus II Father   . Cancer Mother        Deceased at age 89; patient not sure if cancer was in her stomach or colon    CURRENT MEDICATIONS:  Current Outpatient Medications  Medication Sig Dispense Refill  . ferrous sulfate 325 (65 FE) MG tablet Take 325 mg by mouth daily with breakfast.    . hydrochlorothiazide (HYDRODIURIL) 25 MG tablet Take 25 mg by  mouth daily.    Marland Kitchen HYDROcodone-acetaminophen (NORCO/VICODIN) 5-325 MG tablet Take 1 tablet by mouth every 6 (six) hours as needed for moderate pain. 15 tablet 0  . HYDROmorphone (DILAUDID) 2 MG tablet Take 1 tablet (2 mg total) by mouth every 4 (four) hours as needed for severe pain. 30 tablet 0  . ibuprofen (ADVIL,MOTRIN) 800 MG tablet Take 800 mg by mouth every 8 (eight) hours as needed for moderate pain.     Marland Kitchen losartan (COZAAR) 50 MG tablet Take 50 mg by mouth daily.    . metFORMIN (GLUCOPHAGE) 500 MG tablet Take 500 mg by mouth daily with breakfast.     . oxybutynin (DITROPAN) 5 MG tablet Take 5 mg by mouth daily.     Marland Kitchen PACLitaxel (TAXOL IV) Inject 80 mg/m2 into the vein once a week. Day 1, 8, 15 every 28 days    . potassium chloride (MICRO-K) 10 MEQ CR capsule Take 10 mEq by mouth daily.     . pravastatin (PRAVACHOL) 10 MG tablet Take 10 mg by mouth daily.     Marland Kitchen lidocaine-prilocaine (EMLA) cream Apply a small amount to port a cath site and cover with plastic wrap 1 hour prior to  chemotherapy appointments (Patient not taking: Reported on 01/14/2020) 30 g 3  . prochlorperazine (COMPAZINE) 10 MG tablet Take 1 tablet (10 mg total) by mouth every 6 (six) hours as needed (Nausea or vomiting). (Patient not taking: Reported on 01/14/2020) 30 tablet 1   No current facility-administered medications for this visit.    ALLERGIES:  No Known Allergies  PHYSICAL EXAM:  Performance status (ECOG): 1 - Symptomatic but completely ambulatory  Vitals:   01/14/20 0813  BP: 128/72  Pulse: (!) 113  Resp: 19  Temp: (!) 96.9 F (36.1 C)  SpO2: 98%   Wt Readings from Last 3 Encounters:  01/14/20 283 lb 9.6 oz (128.6 kg)  01/07/20 283 lb 14.4 oz (128.8 kg)  12/31/19 (!) 290 lb (131.5 kg)   Physical Exam Vitals reviewed.  Constitutional:      Appearance: Normal appearance. She is obese.  Cardiovascular:     Rate and Rhythm: Regular rhythm. Tachycardia present.     Pulses: Normal pulses.     Heart sounds: Normal heart sounds.  Pulmonary:     Effort: Pulmonary effort is normal.     Breath sounds: Normal breath sounds.  Chest:     Breasts:        Left: Mass (upper outer quadrant mass) present. No swelling.     Comments: Port-a-Cath on R chest Musculoskeletal:     Right upper arm: No tenderness or bony tenderness.     Left upper arm: No tenderness or bony tenderness.  Lymphadenopathy:     Upper Body:     Right upper body: No axillary adenopathy.     Left upper body: No axillary adenopathy.  Neurological:     General: No focal deficit present.     Mental Status: She is alert and oriented to person, place, and time.  Psychiatric:        Mood and Affect: Mood normal.        Behavior: Behavior normal.     LABORATORY DATA:  I have reviewed the labs as listed.  CBC Latest Ref Rng & Units 01/14/2020 01/07/2020 12/31/2019  WBC 4.0 - 10.5 K/uL 3.3(L) 3.8(L) 6.8  Hemoglobin 12.0 - 15.0 g/dL 11.2(L) 11.4(L) 11.1(L)  Hematocrit 36 - 46 % 35.0(L) 36.3 34.6(L)  Platelets 150 - 400  K/uL 270 254 213   CMP Latest Ref Rng & Units 01/14/2020 01/07/2020 12/31/2019  Glucose 70 - 99 mg/dL 216(H) 174(H) 204(H)  BUN 8 - 23 mg/dL 19 19 14   Creatinine 0.44 - 1.00 mg/dL 0.72 0.72 0.79  Sodium 135 - 145 mmol/L 132(L) 134(L) 135  Potassium 3.5 - 5.1 mmol/L 3.5 3.6 3.4(L)  Chloride 98 - 111 mmol/L 98 98 101  CO2 22 - 32 mmol/L 26 26 25   Calcium 8.9 - 10.3 mg/dL 8.7(L) 8.9 8.6(L)  Total Protein 6.5 - 8.1 g/dL 7.4 7.7 7.2  Total Bilirubin 0.3 - 1.2 mg/dL 0.5 0.6 0.5  Alkaline Phos 38 - 126 U/L 85 91 64  AST 15 - 41 U/L 30 37 28  ALT 0 - 44 U/L 25 30 17     DIAGNOSTIC IMAGING:  I have independently reviewed the scans and discussed with the patient. CT Chest W Contrast  Result Date: 12/29/2019 CLINICAL DATA:  Chest pain and shortness of breath, metastatic breast cancer, liver ultrasound prior day EXAM: CT CHEST WITH CONTRAST TECHNIQUE: Multidetector CT imaging of the chest was performed during intravenous contrast administration. CONTRAST:  156m OMNIPAQUE IOHEXOL 300 MG/ML  SOLN COMPARISON:  December 12, 2019 FINDINGS: Cardiovascular: There are no significant vascular findings. The heart size is normal. There is no pericardial thickening or effusion. Mediastinum/Nodes: Again noted is extensive skin thickening of the left breast wall with a heterogeneous hypodense mass along the left chest wall measuring 6.7 x 5.2 cm. Extensive surrounding left chest wall and axillary adenopathy is again identified. There is also extensive left supraclavicular adenopathy. Adenopathy is noted within the left pre vascular space and anterior mediastinum. This appears grossly unchanged from the prior exam. The trachea, thyroid, and esophagus are grossly unremarkable. Lungs/Pleura: There are no areas consolidation. No suspicious pulmonary nodules. There is no pleural effusion. Musculoskeletal/Chest wall: There is a lytic lesion again identified in the posterior T4 vertebral body best seen on series 2, image 17.  Abdomen/pelvis: Hepatobiliary: The multiple small hypodense lesions throughout the liver are not well seen on this examination comparison to prior. The gallbladder is unremarkable. The portal vein is patent. Pancreas: Unremarkable. No pancreatic ductal dilatation or surrounding inflammatory changes. Spleen: Normal in size without focal abnormality. Adrenals/Urinary Tract: Both adrenal glands appear normal. Again noted is a 1 cm low-density lesion seen in the upper pole of the right kidney. There appears to be mild right upper pole perinephric stranding. There is also a small hypodense lesion in the lower pole the left kidney. Bladder is unremarkable. Stomach/Bowel: The stomach and small bowel are unremarkable. The heterogeneous circumferential wall thickening seen within the right colon at the hepatic flexure with mild surrounding fat stranding changes is again identified. The remainder of the colon is grossly unremarkable. There is scattered colonic diverticula. The appendix is unremarkable. Vascular/Lymphatic: There are no enlarged mesenteric, retroperitoneal, or pelvic lymph nodes. Scattered aortic atherosclerotic calcifications are seen without aneurysmal dilatation. Reproductive: Large partially calcified uterine fibroids are present. Other: No evidence of abdominal wall mass or hernia. Musculoskeletal: Advanced left hip osteoarthritis is seen with a probable healed fracture deformity. IMPRESSION: 1. Unchanged left chest wall mass with diffuse skin thickening and extensive axillary, chest wall, mediastinal adenopathy. 2. Mild right-sided perinephric stranding which is nonspecific, however could be due to pyelonephritis. 3. Small hypodense liver lesions, not significantly changed since prior exam. 4. Circumferential wall thickening at the hepatic flexure, concerning for metastatic disease versus colonic primary. 5. Aortic Atherosclerosis (ICD10-I70.0). 6. Lytic lesion in  the posterior T4 vertebral body,  concerning for osseous metastases. Electronically Signed   By: Prudencio Pair M.D.   On: 12/29/2019 03:11   CT Abdomen Pelvis W Contrast  Result Date: 12/29/2019 CLINICAL DATA:  Chest pain and shortness of breath, metastatic breast cancer, liver ultrasound prior day EXAM: CT CHEST WITH CONTRAST TECHNIQUE: Multidetector CT imaging of the chest was performed during intravenous contrast administration. CONTRAST:  130m OMNIPAQUE IOHEXOL 300 MG/ML  SOLN COMPARISON:  December 12, 2019 FINDINGS: Cardiovascular: There are no significant vascular findings. The heart size is normal. There is no pericardial thickening or effusion. Mediastinum/Nodes: Again noted is extensive skin thickening of the left breast wall with a heterogeneous hypodense mass along the left chest wall measuring 6.7 x 5.2 cm. Extensive surrounding left chest wall and axillary adenopathy is again identified. There is also extensive left supraclavicular adenopathy. Adenopathy is noted within the left pre vascular space and anterior mediastinum. This appears grossly unchanged from the prior exam. The trachea, thyroid, and esophagus are grossly unremarkable. Lungs/Pleura: There are no areas consolidation. No suspicious pulmonary nodules. There is no pleural effusion. Musculoskeletal/Chest wall: There is a lytic lesion again identified in the posterior T4 vertebral body best seen on series 2, image 17. Abdomen/pelvis: Hepatobiliary: The multiple small hypodense lesions throughout the liver are not well seen on this examination comparison to prior. The gallbladder is unremarkable. The portal vein is patent. Pancreas: Unremarkable. No pancreatic ductal dilatation or surrounding inflammatory changes. Spleen: Normal in size without focal abnormality. Adrenals/Urinary Tract: Both adrenal glands appear normal. Again noted is a 1 cm low-density lesion seen in the upper pole of the right kidney. There appears to be mild right upper pole perinephric stranding. There is  also a small hypodense lesion in the lower pole the left kidney. Bladder is unremarkable. Stomach/Bowel: The stomach and small bowel are unremarkable. The heterogeneous circumferential wall thickening seen within the right colon at the hepatic flexure with mild surrounding fat stranding changes is again identified. The remainder of the colon is grossly unremarkable. There is scattered colonic diverticula. The appendix is unremarkable. Vascular/Lymphatic: There are no enlarged mesenteric, retroperitoneal, or pelvic lymph nodes. Scattered aortic atherosclerotic calcifications are seen without aneurysmal dilatation. Reproductive: Large partially calcified uterine fibroids are present. Other: No evidence of abdominal wall mass or hernia. Musculoskeletal: Advanced left hip osteoarthritis is seen with a probable healed fracture deformity. IMPRESSION: 1. Unchanged left chest wall mass with diffuse skin thickening and extensive axillary, chest wall, mediastinal adenopathy. 2. Mild right-sided perinephric stranding which is nonspecific, however could be due to pyelonephritis. 3. Small hypodense liver lesions, not significantly changed since prior exam. 4. Circumferential wall thickening at the hepatic flexure, concerning for metastatic disease versus colonic primary. 5. Aortic Atherosclerosis (ICD10-I70.0). 6. Lytic lesion in the posterior T4 vertebral body, concerning for osseous metastases. Electronically Signed   By: BPrudencio PairM.D.   On: 12/29/2019 03:11   [place]OPSY (LIVER)  Result Date: 12/28/2019 INDICATION: History of breast cancer also with annular lesion involving the ascending colon worrisome colon cancer. Patient also found to have indeterminate liver lesions worrisome for metastatic disease and as such presents now for image guided liver lesion biopsy. EXAM: ULTRASOUND GUIDED LIVER LESION BIOPSY COMPARISON:  CT the chest, abdomen and pelvis-12/12/2019 MEDICATIONS: None ANESTHESIA/SEDATION: Fentanyl  mcg IV;  Versed  mg IV Total Moderate Sedation time:   Minutes. The patient's level of consciousness and vital signs were monitored continuously by radiology nursing throughout the procedure under my direct supervision.  COMPLICATIONS: None immediate. PROCEDURE: Informed written consent was obtained from the patient after a discussion of the risks, benefits and alternatives to treatment. The patient understands and consents the procedure. A timeout was performed prior to the initiation of the procedure. Ultrasound scanning was performed of the right upper abdominal quadrant demonstrates multiple hypoechoic nodules scattered throughout the right lobe of the liver. A dominant approximately 1.6 x 1.4 cm hypoechoic lesion within the subcapsular aspect of the right lobe of the liver was targeted for biopsy given lesion location and sonographic window. The procedure was planned. The right upper abdominal quadrant was prepped and draped in the usual sterile fashion. The overlying soft tissues were anesthetized with 1% lidocaine with epinephrine. A 17 gauge, 6.8 cm co-axial needle was advanced into a peripheral aspect of the lesion. This was followed by 6 core biopsies with an 18 gauge core device under direct ultrasound guidance. The coaxial needle tract was embolized with a small amount of Gel-Foam slurry and superficial hemostasis was obtained with manual compression. Post procedural scanning was negative for definitive area of hemorrhage or additional complication. A dressing was placed. The patient tolerated the procedure well without immediate post procedural complication. IMPRESSION: Technically successful ultrasound guided core needle biopsy of indeterminate lesion within the right lobe of the liver. Electronically Signed   By: Sandi Mariscal M.D.   On: 12/28/2019 09:16     ASSESSMENT:  1. Metastatic left breast cancer to the liver and mediastinal lymph nodes: -Biopsy on 11/16/2019 shows infiltrative ductal carcinoma, grade  3, ER 100%, PR 0%, HER-2 1+, Ki-67 30%.Right breast upper outer quadrant biopsy was also positive for malignancy. -MRI of the breast on 12/10/2019 shows left breast malignancy involving all 4 quadrants and skin of the left breast including 0.5 cm biopsy-proven malignancy within the far posterior outer right breast, abnormal highly suspicious nonmuscle like enhancement within all 4 quadrants of the left breast and diffuse skin thickening. This abnormal non-mass-like enhancement and skin thickening extends across the midline to the medial right breast. At least 7 abnormal left axillary lymph nodes and 1 abnormal right axillary lymph node compatible with metastatic disease. -CT CAP on 12/12/2019 shows bulky lymphadenopathy in the left subpectoral and axillary regions with largest lymph node measuring 3.3 cm. Mild mediastinal adenopathy in the right paratracheal prevascular and lateral aortic regions, largest lymph node in the lateral aortic region measuring 2.4 cm. Lymphadenopathy in the inferior jugular and supraclavicular regions, left side greater than right. Mild lymphadenopathy in the right axilla. -Multiple small hypervascular lesions seen in the right and left lobesconsistent with liver metastasis. -Bone scan on 12/12/2019 did not show any bone metastasis. -CT CAP on 12/29/2019 showed possible lytic lesion in the T4 vertebral body. -Liver biopsy on 12/28/2019 consistent with metastatic breast cancer. -Paclitaxel weekly 3 weeks on 1 week off started on 12/31/2019.  2. Right colonic mass: -CT scan showed constricting mass involving the ascending colon measuring 3.6 x 3.5 cm with a small 1 cm right pericolonic lymph node. -She never had a colonoscopy. Denies any bleeding per rectum or melena.   PLAN:  1. Metastatic left breast cancer to the liver: -She has tolerated week to treatment very well.  Her pain in the left arm has improved. -I have reviewed her labs.  White count is 3.3.  Albumin is  3.3 and the rest of the LFTs are normal. -Encouraged high-protein diet.  She will proceed with day 15 of treatment today. -Breast examination shows decrease in erythema in the  left breast, mass in the outer quadrant is stable.  No adenopathy was palpable.  No right axillary adenopathy. -We will see her back in 2 weeks for follow-up.  2. Right colonic mass: -She will follow up with GI for rescheduling colonoscopy.  3. Left breast and left upper extremity pain: -Pain improved with 2 doses of chemotherapy. -She is taking Dilaudid 2 mg twice daily as needed.   Orders placed this encounter:  No orders of the defined types were placed in this encounter.    [name], MD [place] [phone]   I, Milinda Antis, am acting as a scribe for Dr. Sanda Linger.  I, [name] MD, have reviewed the above documentation for accuracy and completeness, and I agree with the above.

## 2020-01-14 NOTE — Patient Instructions (Signed)
[facility] at [facility] Discharge Instructions  You were seen today by Dr. Delton Coombes. He went over your recent results. You received your treatment today. Eat protein-dense meals daily to increase your blood protein levels. Dr. Delton Coombes will see you back in 2 weeks for labs and follow up.   Thank you for choosing [place] at [facility] to provide your oncology and hematology care.  To afford each patient quality time with our provider, please arrive at least 15 minutes before your scheduled appointment time.   If you have a lab appointment with the [facility] please come in thru the Main Entrance and check in at the main information desk  You need to re-schedule your appointment should you arrive 10 or more minutes late.  We strive to give you quality time with our providers, and arriving late affects you and other patients whose appointments are after yours.  Also, if you no show three or more times for appointments you may be dismissed from the clinic at the providers discretion.     Again, thank you for choosing [facility].  Our hope is that these requests will decrease the amount of time that you wait before being seen by our physicians.       _____________________________________________________________  Should you have questions after your visit to [facility], please contact our office at [phone] between the hours of 8:00 a.m. and 4:30 p.m.  Voicemails left after 4:00 p.m. will not be returned until the following business day.  For prescription refill requests, have your pharmacy contact our office and allow 72 hours.    Cancer Center Support Programs:   > Cancer Support Group  2nd Tuesday of the month 1pm-2pm, Journey Room

## 2020-01-14 NOTE — Progress Notes (Signed)
Patient tolerated chemotherapy with no complaints voiced.  Side effects with management reviewed with understanding verbalized.  Port site clean and dry with no bruising or swelling noted at site.  Good blood return noted before and after administration of chemotherapy.  Band aid applied.  Patient left in satisfactory condition with VSS and no s/s of distress noted.   

## 2020-01-28 ENCOUNTER — Inpatient Hospital Stay (HOSPITAL_COMMUNITY): Payer: Medicare HMO

## 2020-01-28 ENCOUNTER — Inpatient Hospital Stay (HOSPITAL_BASED_OUTPATIENT_CLINIC_OR_DEPARTMENT_OTHER): Payer: Medicare HMO | Admitting: Hematology

## 2020-01-28 ENCOUNTER — Other Ambulatory Visit: Payer: Self-pay

## 2020-01-28 VITALS — BP 107/65 | HR 88 | Temp 98.2°F | Resp 18

## 2020-01-28 VITALS — BP 102/73 | HR 102 | Temp 98.2°F | Resp 16 | Wt 283.8 lb

## 2020-01-28 DIAGNOSIS — C50812 Malignant neoplasm of overlapping sites of left female breast: Secondary | ICD-10-CM | POA: Diagnosis not present

## 2020-01-28 DIAGNOSIS — Z170 Estrogen receptor positive status [ER+]: Secondary | ICD-10-CM

## 2020-01-28 DIAGNOSIS — Z95828 Presence of other vascular implants and grafts: Secondary | ICD-10-CM

## 2020-01-28 DIAGNOSIS — Z5111 Encounter for antineoplastic chemotherapy: Secondary | ICD-10-CM | POA: Diagnosis not present

## 2020-01-28 LAB — CBC WITH DIFFERENTIAL/PLATELET
Abs Immature Granulocytes: 0.13 10*3/uL — ABNORMAL HIGH (ref 0.00–0.07)
Basophils Absolute: 0.1 10*3/uL (ref 0.0–0.1)
Basophils Relative: 1 %
Eosinophils Absolute: 0.1 10*3/uL (ref 0.0–0.5)
Eosinophils Relative: 2 %
HCT: 35.7 % — ABNORMAL LOW (ref 36.0–46.0)
Hemoglobin: 11.6 g/dL — ABNORMAL LOW (ref 12.0–15.0)
Immature Granulocytes: 2 %
Lymphocytes Relative: 35 %
Lymphs Abs: 2.2 10*3/uL (ref 0.7–4.0)
MCH: 27.9 pg (ref 26.0–34.0)
MCHC: 32.5 g/dL (ref 30.0–36.0)
MCV: 85.8 fL (ref 80.0–100.0)
Monocytes Absolute: 1 10*3/uL (ref 0.1–1.0)
Monocytes Relative: 16 %
Neutro Abs: 2.8 10*3/uL (ref 1.7–7.7)
Neutrophils Relative %: 44 %
Platelets: 276 10*3/uL (ref 150–400)
RBC: 4.16 MIL/uL (ref 3.87–5.11)
RDW: 14.9 % (ref 11.5–15.5)
WBC: 6.3 10*3/uL (ref 4.0–10.5)
nRBC: 0 % (ref 0.0–0.2)

## 2020-01-28 LAB — COMPREHENSIVE METABOLIC PANEL
ALT: 24 U/L (ref 0–44)
AST: 30 U/L (ref 15–41)
Albumin: 3.6 g/dL (ref 3.5–5.0)
Alkaline Phosphatase: 77 U/L (ref 38–126)
Anion gap: 11 (ref 5–15)
BUN: 16 mg/dL (ref 8–23)
CO2: 23 mmol/L (ref 22–32)
Calcium: 8.8 mg/dL — ABNORMAL LOW (ref 8.9–10.3)
Chloride: 102 mmol/L (ref 98–111)
Creatinine, Ser: 0.69 mg/dL (ref 0.44–1.00)
GFR calc Af Amer: >60 mL/min (ref >60)
GFR calc non Af Amer: >60 mL/min (ref >60)
Glucose, Bld: 128 mg/dL — ABNORMAL HIGH (ref 70–99)
Potassium: 3.4 mmol/L — ABNORMAL LOW (ref 3.5–5.1)
Sodium: 136 mmol/L (ref 135–145)
Total Bilirubin: 0.5 mg/dL (ref 0.3–1.2)
Total Protein: 7.3 g/dL (ref 6.5–8.1)

## 2020-01-28 MED ORDER — DIPHENHYDRAMINE HCL 50 MG/ML IJ SOLN
50.0000 mg | Freq: Once | INTRAMUSCULAR | Status: AC
  Administered 2020-01-28: 50 mg via INTRAVENOUS
  Filled 2020-01-28: qty 1

## 2020-01-28 MED ORDER — HEPARIN SOD (PORK) LOCK FLUSH 100 UNIT/ML IV SOLN
500.0000 [IU] | Freq: Once | INTRAVENOUS | Status: AC | PRN
  Administered 2020-01-28: 500 [IU]

## 2020-01-28 MED ORDER — SODIUM CHLORIDE 0.9 % IV SOLN: Freq: Once | INTRAVENOUS | Status: AC

## 2020-01-28 MED ORDER — FAMOTIDINE IN NACL 20-0.9 MG/50ML-% IV SOLN
20.0000 mg | Freq: Once | INTRAVENOUS | Status: AC
  Administered 2020-01-28: 20 mg via INTRAVENOUS
  Filled 2020-01-28: qty 50

## 2020-01-28 MED ORDER — SODIUM CHLORIDE 0.9 % IV SOLN
10.0000 mg | Freq: Once | INTRAVENOUS | Status: AC
  Administered 2020-01-28: 10 mg via INTRAVENOUS
  Filled 2020-01-28: qty 10

## 2020-01-28 MED ORDER — SODIUM CHLORIDE 0.9 % IV SOLN
80.0000 mg/m2 | Freq: Once | INTRAVENOUS | Status: AC
  Administered 2020-01-28: 192 mg via INTRAVENOUS
  Filled 2020-01-28: qty 32

## 2020-01-28 MED ORDER — LANREOTIDE ACETATE 120 MG/0.5ML ~~LOC~~ SOLN
SUBCUTANEOUS | Status: AC
  Filled 2020-01-28: qty 120

## 2020-01-28 MED ORDER — SODIUM CHLORIDE 0.9 % IV SOLN
Freq: Once | INTRAVENOUS | Status: AC
  Administered 2020-01-28: 8 mg via INTRAVENOUS
  Filled 2020-01-28: qty 4

## 2020-01-28 NOTE — Progress Notes (Signed)
Tolerated infusion w/o adverse reaction.  Alert, in no distress.  VSS.  Discharged via wheelchair in c/o spouse.  

## 2020-01-28 NOTE — Progress Notes (Signed)
Patient was assessed by Dr. Katragadda and labs have been reviewed.  Patient is okay to proceed with treatment today. Primary RN and pharmacy aware.   

## 2020-01-28 NOTE — Progress Notes (Signed)
Crystal Vega, [place] [address]   CLINIC:  Medical Oncology/Hematology  PCP:  Lemmie Evens, MD [address] / [place] [address] [phone]   REASON FOR VISIT:  Follow-up for left breast cancer  PRIOR THERAPY: None  NGS Results: Not done  CURRENT THERAPY: Paclitaxel 3 weeks on 1 week off  BRIEF ONCOLOGIC HISTORY:  Oncology History  Malignant neoplasm of overlapping sites of left breast in female, estrogen receptor positive ([place])  11/16/2019 Cancer Staging   Staging form: Breast, AJCC 8th Edition - Clinical stage from 11/16/2019: Stage IV (cT4b, cN1, cM1, G3, ER+, PR-, HER2-) - Signed by [name], MD on 12/17/2019   11/28/2019 Initial Diagnosis   Patient noted intermittent left breast pain and swelling for several months. Mammogram showed a 6.2cm mass in the left breast axillary tail with an adjacent 0.9cm mass, 2 abnormal left axillary lymph nodes, 1 abnormal right axillary lymph node, and diffuse left breast edema and skin thickening. Biopsy showed IDC, grade 3 in the left breast and bilateral axillas, HER-2 negative (1+), ER+ 100%, PR- 0%, Ki67 30%.    12/31/2019 -  Chemotherapy   The patient had ondansetron (ZOFRAN) 8 mg in sodium chloride 0.9 % 50 mL IVPB, , Intravenous,  Once, 1 of 4 cycles Administration: 8 mg (12/31/2019), 8 mg (01/07/2020), 8 mg (01/14/2020) PACLitaxel (TAXOL) 192 mg in sodium chloride 0.9 % 250 mL chemo infusion (</= 46m/m2), 80 mg/m2 = 192 mg, Intravenous,  Once, 1 of 4 cycles Administration: 192 mg (12/31/2019), 192 mg (01/07/2020), 192 mg (01/14/2020)  for chemotherapy treatment.    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 a 69y.o. female, returns for routine follow-up and consideration for next cycle of chemotherapy. [place] last seen on 01/14/2020.  Due for cycle #2 of paclitaxel today.   Overall, today she tells me she has been feeling pretty well. She has not taken Dilaudid since 8/27 for her breast pain. She denies having numbness or tingling, though she had 1 episode  Overall, she feels ready for next cycle of chemo today.    REVIEW OF SYSTEMS:  Review of Systems  Constitutional: Positive for appetite change (mildly decreased) and fatigue (moderate).  Respiratory: Positive for shortness of breath.   Cardiovascular: Positive for chest pain (2/10 L breast pain).  Neurological: Positive for headaches.  All other systems reviewed and are negative.   PAST MEDICAL/SURGICAL HISTORY:  Past Medical History:  Diagnosis Date  . Anemia   . Arthritis    per patient " left knee"  . Essential hypertension, benign   .  Metastatic breast cancer ([place])    left breast  . Port-A-Cath in place 12/25/2019  . Type 2 diabetes mellitus ([place])    Past Surgical History:  Procedure Laterality Date  . CATARACT EXTRACTION W/ INTRAOCULAR LENS IMPLANT Right   . HYSTEROSCOPY WITH D & C N/A 06/05/2014   Procedure: DILATATION AND CURETTAGE /HYSTEROSCOPY;  Surgeon: Florian Buff, MD;  Location: [facility];  Service: Gynecology;   Laterality: N/A;  . POLYPECTOMY N/A 06/05/2014   Procedure: ENDOMETRIAL POLYPECTOMY;  Surgeon: Florian Buff, MD;  Location: [facility];  Service: Gynecology;  Laterality: N/A;  . PORTACATH PLACEMENT N/A 12/14/2019   Procedure: INSERTION PORT-A-CATH WITH ULTRASOUND GUIDANCE;  Surgeon: Donnie Mesa, MD;  Location: [facility];  Service: General;  Laterality: N/A;  . TUBAL LIGATION      SOCIAL HISTORY:  Social History   Socioeconomic History  . Marital status: Married    Spouse name: Herbie Baltimore  . Number of children: 2  . Years of education: Not on file  . Highest education level: Not on file  Occupational History  . Occupation: retired  Tobacco Use  . Smoking status: Former Smoker    Types: Cigarettes  . Smokeless tobacco: Never Used  . Tobacco comment: quit about 40+ years ago  Vaping Use  . Vaping Use: Never used  Substance and Sexual Activity  . Alcohol use: Never  . Drug use: No  . Sexual activity: Not on file  Other Topics Concern  . Not on file  Social History Narrative  . Not on file   Social Determinants of Health   Financial Resource Strain: Low Risk   . Difficulty of Paying Living Expenses: Not hard at all  Food Insecurity: No Food Insecurity  . Worried About Charity fundraiser in the Last Year: Never true  . Ran Out of Food in the Last Year: Never true  Transportation Needs: No Transportation Needs  . Lack of Transportation (Medical): No  . Lack of Transportation (Non-Medical): No  Physical Activity: Inactive  . Days of Exercise per Week: 0 days  . Minutes of Exercise per Session: 0 min  Stress: No Stress Concern Present  . Feeling of Stress : Only a little  Social Connections: Moderately Integrated  . Frequency of Communication with Friends and Family: Three times a week  . Frequency of Social Gatherings with Friends and Family: Three times a week  . Attends Religious Services: 1 to 4 times per year  . Active Member of Clubs or Organizations: No  . Attends Theatre manager Meetings: Never  . Marital Status: Married  Human resources officer Violence: Not At Risk  . Fear of Current or Ex-Partner: No  . Emotionally Abused: No  . Physically Abused: No  . Sexually Abused: No    FAMILY HISTORY:  Family History  Problem Relation Age of Onset  . Diabetes Mellitus II Father   . Cancer Mother        Deceased at age 40; patient not sure if cancer was in her stomach or colon    CURRENT MEDICATIONS:  Current Outpatient Medications  Medication Sig Dispense Refill  . ferrous sulfate 325 (65 FE) MG tablet Take 325 mg by mouth daily with breakfast.    . hydrochlorothiazide (HYDRODIURIL) 25 MG tablet Take 25 mg by mouth daily.    Marland Kitchen HYDROcodone-acetaminophen (NORCO/VICODIN) 5-325 MG tablet Take 1 tablet by mouth every 6 (six) hours as needed for moderate pain. 15 tablet 0  . HYDROmorphone (DILAUDID) 2  MG tablet Take 1 tablet (2 mg total) by mouth every 4 (four) hours as needed for severe pain. 30 tablet 0  . ibuprofen (ADVIL,MOTRIN) 800 MG tablet Take 800 mg by mouth every 8 (eight) hours as needed for moderate pain.     Marland Kitchen losartan (COZAAR) 50 MG tablet Take 50 mg by mouth daily.    . metFORMIN (GLUCOPHAGE) 500 MG tablet Take 500 mg by mouth daily with breakfast.     . oxybutynin (DITROPAN) 5 MG tablet Take 5 mg by mouth daily.     Marland Kitchen PACLitaxel (TAXOL IV) Inject 80 mg/m2 into the vein once a week. Day 1, 8, 15 every 28 days    . potassium chloride (MICRO-K) 10 MEQ CR capsule Take 10 mEq by mouth daily.     . pravastatin (PRAVACHOL) 10 MG tablet Take 10 mg by mouth daily.     Marland Kitchen lidocaine-prilocaine (EMLA) cream Apply a small amount to port a cath site and cover with plastic wrap 1 hour prior to chemotherapy appointments (Patient not taking: Reported on 01/28/2020) 30 g 3  . prochlorperazine (COMPAZINE) 10 MG tablet Take 1 tablet (10 mg total) by mouth every 6 (six) hours as needed (Nausea or vomiting). (Patient not taking: Reported on 01/28/2020) 30 tablet 1   No  current facility-administered medications for this visit.    ALLERGIES:  No Known Allergies  PHYSICAL EXAM:  Performance status (ECOG): 1 - Symptomatic but completely ambulatory  Vitals:   01/28/20 1034  BP: 102/73  Pulse: (!) 102  Resp: 16  Temp: 98.2 F (36.8 C)  SpO2: 98%   Wt Readings from Last 3 Encounters:  01/28/20 283 lb 12.8 oz (128.7 kg)  01/14/20 283 lb 9.6 oz (128.6 kg)  01/07/20 283 lb 14.4 oz (128.8 kg)   Physical Exam Vitals reviewed.  Constitutional:      Appearance: Normal appearance. She is obese.  Cardiovascular:     Rate and Rhythm: Normal rate and regular rhythm.     Pulses: Normal pulses.     Heart sounds: Normal heart sounds.  Pulmonary:     Effort: Pulmonary effort is normal.     Breath sounds: Normal breath sounds.  Chest:     Chest wall: Mass present.       Comments: Port-a-Cath in R chest Musculoskeletal:     Right lower leg: No edema.     Left lower leg: No edema.  Lymphadenopathy:     Upper Body:     Left upper body: No axillary adenopathy.  Neurological:     General: No focal deficit present.     Mental Status: She is alert and oriented to person, place, and time.  Psychiatric:        Mood and Affect: Mood normal.        Behavior: Behavior normal.     LABORATORY DATA:  I have reviewed the labs as listed.  CBC Latest Ref Rng & Units 01/28/2020 01/14/2020 01/07/2020  WBC 4.0 - 10.5 K/uL 6.3 3.3(L) 3.8(L)  Hemoglobin 12.0 - 15.0 g/dL 11.6(L) 11.2(L) 11.4(L)  Hematocrit 36 - 46 % 35.7(L) 35.0(L) 36.3  Platelets 150 - 400 K/uL 276 270 254   CMP Latest Ref Rng & Units 01/28/2020 01/14/2020 01/07/2020  Glucose 70 - 99 mg/dL 128(H) 216(H) 174(H)  BUN 8 - 23 mg/dL 16 19 19   Creatinine 0.44 - 1.00 mg/dL 0.69 0.72 0.72  Sodium 135 - 145 mmol/L 136 132(L) 134(L)  Potassium 3.5 - 5.1 mmol/L 3.4(L) 3.5  3.6  Chloride 98 - 111 mmol/L 102 98 98  CO2 22 - 32 mmol/L 23 26 26   Calcium 8.9 - 10.3 mg/dL 8.8(L) 8.7(L) 8.9  Total Protein 6.5 - 8.1  g/dL 7.3 7.4 7.7  Total Bilirubin 0.3 - 1.2 mg/dL 0.5 0.5 0.6  Alkaline Phos 38 - 126 U/L 77 85 91  AST 15 - 41 U/L 30 30 37  ALT 0 - 44 U/L 24 25 30     DIAGNOSTIC IMAGING:  I have independently reviewed the scans and discussed with the patient. No results found.   ASSESSMENT:  1. 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 -MRI of the breast on 12/10/2019 shows left breast malignancy involving all 4 quadrants and skin of the left breast including 0.5 cm biopsy-proven malignancy within the far posterior outer right breast, abnormal highly suspicious nonmuscle like enhancement within all 4 quadrants of the left breast and diffuse skin thickening. This abnormal non-mass-like enhancement and skin thickening extends across the midline to the medial right breast. At least 7 abnormal left axillary lymph nodes and 1 abnormal right axillary lymph node compatible with metastatic disease. -CT CAP on 12/12/2019 shows bulky lymphadenopathy in the left subpectoral and axillary regions with largest lymph node measuring 3.3 cm. Mild mediastinal adenopathy in the right paratracheal prevascular and lateral aortic regions, largest lymph node in the lateral aortic region measuring 2.4 cm. Lymphadenopathy in the inferior jugular and supraclavicular regions, left side greater than right. Mild lymphadenopathy in the right axilla. -Multiple small hypervascular lesions seen in the right and left lobesconsistent with liver metastasis. -Bone scan on 12/12/2019 did not show any bone metastasis. -CT CAP on 12/29/2019 showed possible lytic lesion in the T4 vertebral body. -Liver biopsy on 12/28/2019 consistent with metastatic breast cancer. -Paclitaxel weekly 3 weeks on 1 week off started on 12/31/2019.  2. Right  colonic mass: -CT scan showed constricting mass involving the ascending colon measuring 3.6 x 3.5 cm with a small 1 cm right pericolonic lymph node. -She never had a colonoscopy. Denies any bleeding per rectum or melena.   PLAN:  1. Metastatic left breast cancer to the liver: -She has tolerated first cycle of paclitaxel very well. -Physical exam today shows mild improvement in erythema and size of the left breast tumor.  Her pain in the left upper extremity also improved. -Review of labs today shows normal LFTs.  White count and platelets are also normal. -We will proceed with cycle 2-day 1 today.  I plan to see her back in 4 weeks for follow-up.  2. Right colonic mass: -Follow-up with GI regarding colonoscopy.  3. Left breast and left upper extremity pain: -Continue Dilaudid 2 mg as needed.  Pain has improved since start of chemo.   Orders placed this encounter:  No orders of the defined types were placed in this encounter.    [name], MD [place] [phone]   I, Milinda Antis, am acting as a scribe for Dr. Sanda Linger.  I, [name] MD, have reviewed the above documentation for accuracy and completeness, and I agree with the above.

## 2020-01-28 NOTE — Progress Notes (Signed)
Patient's Port-a-cath flushed with good blood returned noted, blood work obtained and sent to lab. No bruising, pain or swelling noted at site. Patient left accessed for chemo treatment today. No s/s distress noted. PT waiting on MD visit and put back into the waiting area.

## 2020-01-28 NOTE — Patient Instructions (Signed)
[place] at [facility] Discharge Instructions  You were seen today by Dr. Delton Coombes. He went over your recent results. You received your treatment today; continue getting your weekly treatments. Dr. Delton Coombes will see you back in 4 weeks for labs and follow up.   Thank you for choosing [place] at [facility] to provide your oncology and hematology care.  To afford each patient quality time with our provider, please arrive at least 15 minutes before your scheduled appointment time.   If you have a lab appointment with the [facility] please come in thru the Main Entrance and check in at the main information desk  You need to re-schedule your appointment should you arrive 10 or more minutes late.  We strive to give you quality time with our providers, and arriving late affects you and other patients whose appointments are after yours.  Also, if you no show three or more times for appointments you may be dismissed from the clinic at the providers discretion.     Again, thank you for choosing [facility]  Our hope is that these requests will decrease the amount of time that you wait before being seen by our physicians.       _____________________________________________________________  Should you have questions after your visit to [facility], please contact our office at [phone] between the hours of 8:00 a.m. and 4:30 p.m.  Voicemails left after 4:00 p.m. will not be returned until the following business day.  For prescription refill requests, have your pharmacy contact our office and allow 72 hours.    Cancer Center Support Programs:   > Cancer Support Group  2nd Tuesday of the month 1pm-2pm, Journey Room

## 2020-01-29 MED ORDER — CYANOCOBALAMIN 1000 MCG/ML IJ SOLN
INTRAMUSCULAR | Status: AC
  Filled 2020-01-29: qty 1

## 2020-02-05 ENCOUNTER — Other Ambulatory Visit: Payer: Self-pay

## 2020-02-05 ENCOUNTER — Inpatient Hospital Stay (HOSPITAL_COMMUNITY): Payer: Medicare HMO | Attending: Hematology

## 2020-02-05 ENCOUNTER — Encounter (HOSPITAL_COMMUNITY): Payer: Self-pay

## 2020-02-05 ENCOUNTER — Inpatient Hospital Stay (HOSPITAL_COMMUNITY): Payer: Medicare HMO

## 2020-02-05 VITALS — BP 114/71 | HR 84 | Temp 98.1°F | Resp 18

## 2020-02-05 DIAGNOSIS — Z5111 Encounter for antineoplastic chemotherapy: Secondary | ICD-10-CM | POA: Insufficient documentation

## 2020-02-05 DIAGNOSIS — R200 Anesthesia of skin: Secondary | ICD-10-CM | POA: Diagnosis not present

## 2020-02-05 DIAGNOSIS — Z87891 Personal history of nicotine dependence: Secondary | ICD-10-CM | POA: Diagnosis not present

## 2020-02-05 DIAGNOSIS — Z95828 Presence of other vascular implants and grafts: Secondary | ICD-10-CM

## 2020-02-05 DIAGNOSIS — N390 Urinary tract infection, site not specified: Secondary | ICD-10-CM | POA: Insufficient documentation

## 2020-02-05 DIAGNOSIS — R0602 Shortness of breath: Secondary | ICD-10-CM | POA: Insufficient documentation

## 2020-02-05 DIAGNOSIS — E119 Type 2 diabetes mellitus without complications: Secondary | ICD-10-CM | POA: Diagnosis not present

## 2020-02-05 DIAGNOSIS — Z79899 Other long term (current) drug therapy: Secondary | ICD-10-CM | POA: Insufficient documentation

## 2020-02-05 DIAGNOSIS — R05 Cough: Secondary | ICD-10-CM | POA: Diagnosis not present

## 2020-02-05 DIAGNOSIS — R5383 Other fatigue: Secondary | ICD-10-CM | POA: Insufficient documentation

## 2020-02-05 DIAGNOSIS — I10 Essential (primary) hypertension: Secondary | ICD-10-CM | POA: Insufficient documentation

## 2020-02-05 DIAGNOSIS — Z170 Estrogen receptor positive status [ER+]: Secondary | ICD-10-CM

## 2020-02-05 DIAGNOSIS — C773 Secondary and unspecified malignant neoplasm of axilla and upper limb lymph nodes: Secondary | ICD-10-CM | POA: Diagnosis present

## 2020-02-05 DIAGNOSIS — M1990 Unspecified osteoarthritis, unspecified site: Secondary | ICD-10-CM | POA: Insufficient documentation

## 2020-02-05 DIAGNOSIS — C50411 Malignant neoplasm of upper-outer quadrant of right female breast: Secondary | ICD-10-CM | POA: Diagnosis present

## 2020-02-05 DIAGNOSIS — Z7984 Long term (current) use of oral hypoglycemic drugs: Secondary | ICD-10-CM | POA: Diagnosis not present

## 2020-02-05 LAB — COMPREHENSIVE METABOLIC PANEL
ALT: 23 U/L (ref 0–44)
AST: 25 U/L (ref 15–41)
Albumin: 3.5 g/dL (ref 3.5–5.0)
Alkaline Phosphatase: 67 U/L (ref 38–126)
Anion gap: 11 (ref 5–15)
BUN: 19 mg/dL (ref 8–23)
CO2: 26 mmol/L (ref 22–32)
Calcium: 9.2 mg/dL (ref 8.9–10.3)
Chloride: 99 mmol/L (ref 98–111)
Creatinine, Ser: 0.71 mg/dL (ref 0.44–1.00)
GFR calc Af Amer: >60 mL/min (ref >60)
GFR calc non Af Amer: >60 mL/min (ref >60)
Glucose, Bld: 134 mg/dL — ABNORMAL HIGH (ref 70–99)
Potassium: 3.5 mmol/L (ref 3.5–5.1)
Sodium: 136 mmol/L (ref 135–145)
Total Bilirubin: 0.9 mg/dL (ref 0.3–1.2)
Total Protein: 7.4 g/dL (ref 6.5–8.1)

## 2020-02-05 LAB — CBC WITH DIFFERENTIAL/PLATELET
Abs Immature Granulocytes: 0.05 10*3/uL (ref 0.00–0.07)
Basophils Absolute: 0.1 10*3/uL (ref 0.0–0.1)
Basophils Relative: 1 %
Eosinophils Absolute: 0.1 10*3/uL (ref 0.0–0.5)
Eosinophils Relative: 1 %
HCT: 33.8 % — ABNORMAL LOW (ref 36.0–46.0)
Hemoglobin: 11 g/dL — ABNORMAL LOW (ref 12.0–15.0)
Immature Granulocytes: 1 %
Lymphocytes Relative: 34 %
Lymphs Abs: 2.2 10*3/uL (ref 0.7–4.0)
MCH: 27.6 pg (ref 26.0–34.0)
MCHC: 32.5 g/dL (ref 30.0–36.0)
MCV: 84.9 fL (ref 80.0–100.0)
Monocytes Absolute: 0.5 10*3/uL (ref 0.1–1.0)
Monocytes Relative: 7 %
Neutro Abs: 3.5 10*3/uL (ref 1.7–7.7)
Neutrophils Relative %: 56 %
Platelets: 286 10*3/uL (ref 150–400)
RBC: 3.98 MIL/uL (ref 3.87–5.11)
RDW: 14.6 % (ref 11.5–15.5)
WBC: 6.3 10*3/uL (ref 4.0–10.5)
nRBC: 0 % (ref 0.0–0.2)

## 2020-02-05 MED ORDER — SODIUM CHLORIDE 0.9% FLUSH
10.0000 mL | INTRAVENOUS | Status: DC | PRN
  Administered 2020-02-05: 10 mL

## 2020-02-05 MED ORDER — SODIUM CHLORIDE 0.9 % IV SOLN: Freq: Once | INTRAVENOUS | Status: AC

## 2020-02-05 MED ORDER — SODIUM CHLORIDE 0.9 % IV SOLN
10.0000 mg | Freq: Once | INTRAVENOUS | Status: AC
  Administered 2020-02-05: 10 mg via INTRAVENOUS
  Filled 2020-02-05: qty 10

## 2020-02-05 MED ORDER — DIPHENHYDRAMINE HCL 50 MG/ML IJ SOLN
50.0000 mg | Freq: Once | INTRAMUSCULAR | Status: AC
  Administered 2020-02-05: 50 mg via INTRAVENOUS
  Filled 2020-02-05: qty 1

## 2020-02-05 MED ORDER — SODIUM CHLORIDE 0.9 % IV SOLN
Freq: Once | INTRAVENOUS | Status: AC
  Administered 2020-02-05: 8 mg via INTRAVENOUS
  Filled 2020-02-05: qty 4

## 2020-02-05 MED ORDER — FAMOTIDINE IN NACL 20-0.9 MG/50ML-% IV SOLN
20.0000 mg | Freq: Once | INTRAVENOUS | Status: AC
  Administered 2020-02-05: 20 mg via INTRAVENOUS
  Filled 2020-02-05: qty 50

## 2020-02-05 MED ORDER — HEPARIN SOD (PORK) LOCK FLUSH 100 UNIT/ML IV SOLN
500.0000 [IU] | Freq: Once | INTRAVENOUS | Status: AC | PRN
  Administered 2020-02-05: 500 [IU]

## 2020-02-05 MED ORDER — SODIUM CHLORIDE 0.9 % IV SOLN
80.0000 mg/m2 | Freq: Once | INTRAVENOUS | Status: AC
  Administered 2020-02-05: 192 mg via INTRAVENOUS
  Filled 2020-02-05: qty 32

## 2020-02-05 NOTE — Patient Instructions (Signed)
[facility] Discharge Instructions for Patients Receiving Chemotherapy  Today you received the following chemotherapy agents Taxol  To help prevent nausea and vomiting after your treatment, we encourage you to take your nausea medication.    If you develop nausea and vomiting that is not controlled by your nausea medication, call the clinic.   BELOW ARE SYMPTOMS THAT SHOULD BE REPORTED IMMEDIATELY:  *FEVER GREATER THAN 100.5 F  *CHILLS WITH OR WITHOUT FEVER  NAUSEA AND VOMITING THAT IS NOT CONTROLLED WITH YOUR NAUSEA MEDICATION  *UNUSUAL SHORTNESS OF BREATH  *UNUSUAL BRUISING OR BLEEDING  TENDERNESS IN MOUTH AND THROAT WITH OR WITHOUT PRESENCE OF ULCERS  *URINARY PROBLEMS  *BOWEL PROBLEMS  UNUSUAL RASH Items with * indicate a potential emergency and should be followed up as soon as possible.  Feel free to call the clinic should you have any questions or concerns. The clinic phone number is [phone].  Please show the CHEMO ALERT CARD at check-in to the Emergency Department and triage nurse.   

## 2020-02-05 NOTE — Progress Notes (Signed)
Patient tolerated therapy with no complaints voiced. Possible side effects with management reviewed with understanding verbalized. Port cite clean and dry with no bruising or swelling noted at site. Good blood return noted before and after administration of therapy. Band aid applied. Patient left in satisfactory condition with VSS and no s/s of distress noted. Patient to follow up as instructed.

## 2020-02-07 ENCOUNTER — Inpatient Hospital Stay (HOSPITAL_BASED_OUTPATIENT_CLINIC_OR_DEPARTMENT_OTHER): Payer: Medicare HMO | Admitting: Genetic Counselor

## 2020-02-07 ENCOUNTER — Other Ambulatory Visit: Payer: Self-pay

## 2020-02-07 ENCOUNTER — Inpatient Hospital Stay (HOSPITAL_COMMUNITY): Payer: Medicare HMO

## 2020-02-07 DIAGNOSIS — Z170 Estrogen receptor positive status [ER+]: Secondary | ICD-10-CM

## 2020-02-07 DIAGNOSIS — C50812 Malignant neoplasm of overlapping sites of left female breast: Secondary | ICD-10-CM | POA: Diagnosis not present

## 2020-02-07 DIAGNOSIS — Z800 Family history of malignant neoplasm of digestive organs: Secondary | ICD-10-CM

## 2020-02-07 DIAGNOSIS — C50611 Malignant neoplasm of axillary tail of right female breast: Secondary | ICD-10-CM

## 2020-02-07 DIAGNOSIS — Z8049 Family history of malignant neoplasm of other genital organs: Secondary | ICD-10-CM | POA: Diagnosis not present

## 2020-02-11 ENCOUNTER — Inpatient Hospital Stay (HOSPITAL_COMMUNITY): Payer: Medicare HMO

## 2020-02-11 ENCOUNTER — Encounter (HOSPITAL_COMMUNITY): Payer: Self-pay | Admitting: Genetic Counselor

## 2020-02-11 ENCOUNTER — Other Ambulatory Visit: Payer: Self-pay

## 2020-02-11 ENCOUNTER — Ambulatory Visit (HOSPITAL_COMMUNITY): Payer: Medicare HMO | Admitting: Hematology

## 2020-02-11 ENCOUNTER — Encounter (HOSPITAL_COMMUNITY): Payer: Self-pay

## 2020-02-11 VITALS — BP 118/66 | HR 91 | Temp 97.1°F | Resp 18

## 2020-02-11 DIAGNOSIS — Z8049 Family history of malignant neoplasm of other genital organs: Secondary | ICD-10-CM | POA: Insufficient documentation

## 2020-02-11 DIAGNOSIS — C50812 Malignant neoplasm of overlapping sites of left female breast: Secondary | ICD-10-CM

## 2020-02-11 DIAGNOSIS — Z800 Family history of malignant neoplasm of digestive organs: Secondary | ICD-10-CM | POA: Insufficient documentation

## 2020-02-11 DIAGNOSIS — Z95828 Presence of other vascular implants and grafts: Secondary | ICD-10-CM

## 2020-02-11 DIAGNOSIS — Z5111 Encounter for antineoplastic chemotherapy: Secondary | ICD-10-CM | POA: Diagnosis not present

## 2020-02-11 LAB — CBC WITH DIFFERENTIAL/PLATELET
Abs Immature Granulocytes: 0.03 10*3/uL (ref 0.00–0.07)
Basophils Absolute: 0.1 10*3/uL (ref 0.0–0.1)
Basophils Relative: 1 %
Eosinophils Absolute: 0.1 10*3/uL (ref 0.0–0.5)
Eosinophils Relative: 2 %
HCT: 32.3 % — ABNORMAL LOW (ref 36.0–46.0)
Hemoglobin: 10.4 g/dL — ABNORMAL LOW (ref 12.0–15.0)
Immature Granulocytes: 1 %
Lymphocytes Relative: 42 %
Lymphs Abs: 1.7 10*3/uL (ref 0.7–4.0)
MCH: 27.7 pg (ref 26.0–34.0)
MCHC: 32.2 g/dL (ref 30.0–36.0)
MCV: 85.9 fL (ref 80.0–100.0)
Monocytes Absolute: 0.2 10*3/uL (ref 0.1–1.0)
Monocytes Relative: 5 %
Neutro Abs: 2 10*3/uL (ref 1.7–7.7)
Neutrophils Relative %: 49 %
Platelets: 268 10*3/uL (ref 150–400)
RBC: 3.76 MIL/uL — ABNORMAL LOW (ref 3.87–5.11)
RDW: 14.9 % (ref 11.5–15.5)
WBC: 4.1 10*3/uL (ref 4.0–10.5)
nRBC: 0 % (ref 0.0–0.2)

## 2020-02-11 LAB — COMPREHENSIVE METABOLIC PANEL
ALT: 23 U/L (ref 0–44)
AST: 24 U/L (ref 15–41)
Albumin: 3.4 g/dL — ABNORMAL LOW (ref 3.5–5.0)
Alkaline Phosphatase: 65 U/L (ref 38–126)
Anion gap: 9 (ref 5–15)
BUN: 15 mg/dL (ref 8–23)
CO2: 26 mmol/L (ref 22–32)
Calcium: 9.2 mg/dL (ref 8.9–10.3)
Chloride: 101 mmol/L (ref 98–111)
Creatinine, Ser: 0.62 mg/dL (ref 0.44–1.00)
GFR calc Af Amer: >60 mL/min (ref >60)
GFR calc non Af Amer: >60 mL/min (ref >60)
Glucose, Bld: 130 mg/dL — ABNORMAL HIGH (ref 70–99)
Potassium: 3.6 mmol/L (ref 3.5–5.1)
Sodium: 136 mmol/L (ref 135–145)
Total Bilirubin: 0.5 mg/dL (ref 0.3–1.2)
Total Protein: 7 g/dL (ref 6.5–8.1)

## 2020-02-11 MED ORDER — DIPHENHYDRAMINE HCL 50 MG/ML IJ SOLN
50.0000 mg | Freq: Once | INTRAMUSCULAR | Status: AC
  Administered 2020-02-11: 50 mg via INTRAVENOUS
  Filled 2020-02-11: qty 1

## 2020-02-11 MED ORDER — HEPARIN SOD (PORK) LOCK FLUSH 100 UNIT/ML IV SOLN: 500.0000 [IU] | Freq: Once | INTRAVENOUS | Status: DC | PRN

## 2020-02-11 MED ORDER — SODIUM CHLORIDE 0.9 % IV SOLN
10.0000 mg | Freq: Once | INTRAVENOUS | Status: AC
  Administered 2020-02-11: 10 mg via INTRAVENOUS
  Filled 2020-02-11: qty 10

## 2020-02-11 MED ORDER — FAMOTIDINE IN NACL 20-0.9 MG/50ML-% IV SOLN
20.0000 mg | Freq: Once | INTRAVENOUS | Status: AC
  Administered 2020-02-11: 20 mg via INTRAVENOUS
  Filled 2020-02-11: qty 50

## 2020-02-11 MED ORDER — SODIUM CHLORIDE 0.9 % IV SOLN
80.0000 mg/m2 | Freq: Once | INTRAVENOUS | Status: AC
  Administered 2020-02-11: 192 mg via INTRAVENOUS
  Filled 2020-02-11: qty 32

## 2020-02-11 MED ORDER — SODIUM CHLORIDE 0.9% FLUSH
10.0000 mL | INTRAVENOUS | Status: DC | PRN
  Administered 2020-02-11: 10 mL

## 2020-02-11 MED ORDER — SODIUM CHLORIDE 0.9 % IV SOLN: Freq: Once | INTRAVENOUS | Status: AC

## 2020-02-11 MED ORDER — SODIUM CHLORIDE 0.9 % IV SOLN
Freq: Once | INTRAVENOUS | Status: AC
  Administered 2020-02-11: 8 mg via INTRAVENOUS
  Filled 2020-02-11: qty 4

## 2020-02-11 NOTE — Progress Notes (Signed)
Patient tolerated chemotherapy with no complaints voiced.  Side effects with management reviewed with understanding verbalized.  Port site clean and dry with no bruising or swelling noted at site.  Good blood return noted before and after administration of chemotherapy.  Band aid applied.  Patient left in satisfactory condition with VSS and no s/s of distress noted.   

## 2020-02-11 NOTE — Progress Notes (Signed)
REFERRING PROVIDER: [name], MD [address] [place],  [place] [address]  PRIMARY PROVIDER:  Lemmie Evens, MD  PRIMARY REASON FOR VISIT:  1. Malignant neoplasm of overlapping sites of left breast in female, estrogen receptor positive ([place])   2. 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 Family history of GI tract cancer   4. Family history of cancer of female genital organ      I connected with Crystal Vega on 02/07/2020 at 11:00 am EDT by video conference and verified that I am speaking with the correct person using two identifiers.   Patient location: [facility] Provider location: [facility] office  HISTORY OF PRESENT ILLNESS:   Crystal Vega, a 69 y.o. female, was seen for a [facility] cancer genetics consultation at the request of Dr. Delton Coombes due to a personal and family history of cancer.  Crystal Vega presents to clinic today to discuss the possibility of a hereditary predisposition to cancer, genetic testing, and to further clarify her future cancer risks, as well as potential cancer risks for family members.   In June of 2021, at the age of 9, Crystal Vega was diagnosed with metastatic invasive ductal carcinoma, ER+/PR-/Her2-, of the left breast. The treatment plan includes chemotherapy.   CANCER HISTORY:  Oncology History  Malignant neoplasm of overlapping sites of left breast in female, estrogen receptor positive ([place])  11/16/2019 Cancer Staging   Staging form: Breast, AJCC 8th Edition - Clinical stage from 11/16/2019: Stage IV (cT4b, cN1, cM1, G3, ER+, PR-, HER2-) - Signed by [name], MD on 12/17/2019   11/28/2019 Initial Diagnosis   Patient noted intermittent left breast pain and swelling for several months. Mammogram showed a 6.2cm mass in the left breast axillary tail with an adjacent 0.9cm mass, 2 abnormal left axillary lymph nodes, 1 abnormal right axillary lymph node, and diffuse left breast edema and skin thickening. Biopsy  showed IDC, grade 3 in the left breast and bilateral axillas, HER-2 negative (1+), ER+ 100%, PR- 0%, Ki67 30%.    12/31/2019 -  Chemotherapy   The patient had ondansetron (ZOFRAN) 8 mg in sodium chloride 0.9 % 50 mL IVPB, , Intravenous,  Once, 2 of 4 cycles Administration: 8 mg (12/31/2019), 8 mg (01/07/2020), 8 mg (01/28/2020), 8 mg (02/05/2020), 8 mg (01/14/2020), 8 mg (02/11/2020) PACLitaxel (TAXOL) 192 mg in sodium chloride 0.9 % 250 mL chemo infusion (</= 59m/m2), 80 mg/m2 = 192 mg, Intravenous,  Once, 2 of 4 cycles Administration: 192 mg (12/31/2019), 192 mg (01/07/2020), 192 mg (01/28/2020), 192 mg (02/05/2020), 192 mg (01/14/2020), 192 mg (02/11/2020)  for chemotherapy treatment.    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  Ovaries intact: yes.  Hysterectomy: no.  Menopausal status: postmenopausal.  HRT use: 0 years. Colonoscopy: no; not examined. Mammogram within the last year: yes. Any excessive radiation exposure in the past: no   Past Medical History:  Diagnosis Date  . Anemia   . Arthritis    per patient " left knee"  .  Essential hypertension, benign   . Family history of cancer of female genital organ   . Family history of GI tract cancer   . Metastatic breast cancer ([place])    left breast  . Port-A-Cath in place 12/25/2019  . Type 2 diabetes mellitus ([place])     Past Surgical History:  Procedure Laterality Date  . CATARACT EXTRACTION W/ INTRAOCULAR LENS IMPLANT Right   . HYSTEROSCOPY WITH D & C N/A 06/05/2014   Procedure: DILATATION AND CURETTAGE /HYSTEROSCOPY;   Surgeon: Florian Buff, MD;  Location: [facility];  Service: Gynecology;  Laterality: N/A;  . POLYPECTOMY N/A 06/05/2014   Procedure: ENDOMETRIAL POLYPECTOMY;  Surgeon: Florian Buff, MD;  Location: [facility];  Service: Gynecology;  Laterality: N/A;  . PORTACATH PLACEMENT N/A 12/14/2019   Procedure: INSERTION PORT-A-CATH WITH ULTRASOUND GUIDANCE;  Surgeon: Donnie Mesa, MD;  Location: [facility];  Service: General;  Laterality: N/A;  . TUBAL LIGATION      Social History   Socioeconomic History  . Marital status: Married    Spouse name: Herbie Baltimore  . Number of children: 2  . Years of education: Not on file  . Highest education level: Not on file  Occupational History  . Occupation: retired  Tobacco Use  . Smoking status: Former Smoker    Types: Cigarettes  . Smokeless tobacco: Never Used  . Tobacco comment: quit about 40+ years ago  Vaping Use  . Vaping Use: Never used  Substance and Sexual Activity  . Alcohol use: Never  . Drug use: No  . Sexual activity: Not on file  Other Topics Concern  . Not on file  Social History Narrative  . Not on file   Social Determinants of Health   Financial Resource Strain: Low Risk   . Difficulty of Paying Living Expenses: Not hard at all  Food Insecurity: No Food Insecurity  . Worried About Charity fundraiser in the Last Year: Never true  . Ran Out of Food in the Last Year: Never true  Transportation Needs: No Transportation Needs  . Lack of Transportation (Medical): No  . Lack of Transportation (Non-Medical): No  Physical Activity: Inactive  . Days of Exercise per Week: 0 days  . Minutes of Exercise per Session: 0 min  Stress: No Stress Concern Present  . Feeling of Stress : Only a little  Social Connections: Moderately Integrated  . Frequency of Communication with Friends and Family: Three times a week  . Frequency of Social Gatherings with Friends and Family: Three times a week  . Attends Religious Services: 1 to 4 times per year  . Active  Member of Clubs or Organizations: No  . Attends Archivist Meetings: Never  . Marital Status: Married     FAMILY HISTORY:  We obtained a detailed, 4-generation family history.  Significant diagnoses are listed below: Family History  Problem Relation Age of Onset  . Diabetes Mellitus II Father   . Congestive Heart Failure Father   . Cancer Mother        Deceased at age 53; patient not sure if cancer was in her stomach or colon  . Stroke Maternal Grandmother   . Cancer Cousin        female reproductive cancer, dx. 50s/60s   Crystal Vega has two daughters (ages 2 and 66). Neither have had cancer, although her older daughter had abnormal cervical cells when she was about 49. Crystal Vega has no siblings.  Crystal Vega mother died at the  age of 64 from a gastrointestinal cancer (she is not sure if this was colon or stomach cancer). Crystal Vega had five maternal aunts and one maternal uncle. All of these aunts and uncles died in their 73s and none had cancer. She has a maternal first cousin who had a female reproductive cancer diagnosed in her late 3s or early 8s. Crystal Vega maternal grandmother died at the age of 30 from stroke, and she has no information about her maternal grandfather.   Crystal Vega father died in his 21s and did not have cancer. She had one paternal uncle who died in his late 39s from a stroke. Her paternal grandmother died in her 39s or 30s from excessive bleeding, and she does not have information about her paternal grandfather.  Crystal Vega is unaware of previous family history of genetic testing for hereditary cancer risks. Patient's ancestors are of unknown descent. There is no reported Ashkenazi Jewish ancestry. There is no known consanguinity.  GENETIC COUNSELING ASSESSMENT: Crystal Vega is a 69 y.o. female with a personal history of metastatic breast cancer and a family history of a young-onset GI cancer and female reproductive cancer which is somewhat suggestive of a  hereditary cancer syndrome and predisposition to cancer. We, therefore, discussed and recommended the following at today's visit.   DISCUSSION: We discussed that approximately 5-10% of breast cancer is hereditary, with most cases associated with the BRCA1 and BRCA2 genes. There are other genes that can be associated with hereditary breast cancer syndromes. These include ATM, CHEK2, PALB2, etc. We discussed that testing is beneficial for several reasons, including identifying whether targeted treatment with PARP inhibitors would be beneficial, knowing about other cancer risks, identifying potential screening and risk-reduction options that may be appropriate, and to understand if other family members could be at risk for cancer and allow them to undergo genetic testing.   We reviewed the characteristics, features and inheritance patterns of hereditary cancer syndromes. We also discussed genetic testing, including the appropriate family members to test, the process of testing, insurance coverage and turn-around-time for results. We discussed the implications of a negative, positive and/or variant of uncertain significant result. We recommended Crystal Vega pursue genetic testing for a hereditary cancer gene panel including known breast cancer genes.   Based on Crystal Vega's personal and family history of cancer, she meets medical criteria for genetic testing. Despite that she meets criteria, there may still be an out of pocket cost.   PLAN:  Despite our recommendation, Crystal Vega did not wish to pursue genetic testing at today's visit. She has felt overwhelmed with the amount of information she has received about her diagnosis and treatment and needs some time to consider genetic testing. She would also like to discuss testing with her daughters. We understand this decision and remain available to coordinate genetic testing at any time in the future. We, therefore, recommend Crystal Vega continue to follow the cancer  screening guidelines given by her primary healthcare provider.  Ms. Sorn questions were answered to her satisfaction today. Our contact information was provided should additional questions or concerns arise. Thank you for the referral and allowing [place] to share in the care of your patient.   Clint Guy, [place], [facility] Licensed, Certified Dispensing [email or web address] Phone: [phone]  The patient was seen for a total of 40 minutes in face-to-face genetic counseling.  This patient was discussed with Drs. Magrinat, Lindi Adie and/or Burr Medico who agrees with the above.    _______________________________________________________________________ For Office Staff:  Number of people involved in session: 1 Was an Intern/ student involved with case: no

## 2020-02-25 ENCOUNTER — Inpatient Hospital Stay (HOSPITAL_COMMUNITY): Payer: Medicare HMO

## 2020-02-25 ENCOUNTER — Encounter (HOSPITAL_COMMUNITY): Payer: Self-pay | Admitting: Hematology

## 2020-02-25 ENCOUNTER — Other Ambulatory Visit: Payer: Self-pay

## 2020-02-25 ENCOUNTER — Inpatient Hospital Stay (HOSPITAL_BASED_OUTPATIENT_CLINIC_OR_DEPARTMENT_OTHER): Payer: Medicare HMO | Admitting: Hematology

## 2020-02-25 VITALS — BP 103/68 | HR 95 | Temp 96.8°F | Resp 18

## 2020-02-25 VITALS — BP 123/85 | HR 109 | Temp 97.0°F | Resp 20 | Wt 285.6 lb

## 2020-02-25 DIAGNOSIS — C50812 Malignant neoplasm of overlapping sites of left female breast: Secondary | ICD-10-CM

## 2020-02-25 DIAGNOSIS — R300 Dysuria: Secondary | ICD-10-CM | POA: Diagnosis not present

## 2020-02-25 DIAGNOSIS — Z95828 Presence of other vascular implants and grafts: Secondary | ICD-10-CM

## 2020-02-25 DIAGNOSIS — Z5111 Encounter for antineoplastic chemotherapy: Secondary | ICD-10-CM | POA: Diagnosis not present

## 2020-02-25 DIAGNOSIS — Z170 Estrogen receptor positive status [ER+]: Secondary | ICD-10-CM

## 2020-02-25 LAB — CBC WITH DIFFERENTIAL/PLATELET
Abs Immature Granulocytes: 0.27 10*3/uL — ABNORMAL HIGH (ref 0.00–0.07)
Basophils Absolute: 0.1 10*3/uL (ref 0.0–0.1)
Basophils Relative: 1 %
Eosinophils Absolute: 0.1 10*3/uL (ref 0.0–0.5)
Eosinophils Relative: 1 %
HCT: 33.1 % — ABNORMAL LOW (ref 36.0–46.0)
Hemoglobin: 10.7 g/dL — ABNORMAL LOW (ref 12.0–15.0)
Immature Granulocytes: 4 %
Lymphocytes Relative: 31 %
Lymphs Abs: 2.3 10*3/uL (ref 0.7–4.0)
MCH: 28.2 pg (ref 26.0–34.0)
MCHC: 32.3 g/dL (ref 30.0–36.0)
MCV: 87.1 fL (ref 80.0–100.0)
Monocytes Absolute: 1.1 10*3/uL — ABNORMAL HIGH (ref 0.1–1.0)
Monocytes Relative: 15 %
Neutro Abs: 3.5 10*3/uL (ref 1.7–7.7)
Neutrophils Relative %: 48 %
Platelets: 248 10*3/uL (ref 150–400)
RBC: 3.8 MIL/uL — ABNORMAL LOW (ref 3.87–5.11)
RDW: 16.9 % — ABNORMAL HIGH (ref 11.5–15.5)
WBC: 7.3 10*3/uL (ref 4.0–10.5)
nRBC: 0.3 % — ABNORMAL HIGH (ref 0.0–0.2)

## 2020-02-25 LAB — COMPREHENSIVE METABOLIC PANEL
ALT: 21 U/L (ref 0–44)
AST: 27 U/L (ref 15–41)
Albumin: 3.4 g/dL — ABNORMAL LOW (ref 3.5–5.0)
Alkaline Phosphatase: 80 U/L (ref 38–126)
Anion gap: 10 (ref 5–15)
BUN: 18 mg/dL (ref 8–23)
CO2: 27 mmol/L (ref 22–32)
Calcium: 8.9 mg/dL (ref 8.9–10.3)
Chloride: 99 mmol/L (ref 98–111)
Creatinine, Ser: 0.75 mg/dL (ref 0.44–1.00)
GFR calc Af Amer: >60 mL/min (ref >60)
GFR calc non Af Amer: >60 mL/min (ref >60)
Glucose, Bld: 150 mg/dL — ABNORMAL HIGH (ref 70–99)
Potassium: 3.5 mmol/L (ref 3.5–5.1)
Sodium: 136 mmol/L (ref 135–145)
Total Bilirubin: 0.5 mg/dL (ref 0.3–1.2)
Total Protein: 7.1 g/dL (ref 6.5–8.1)

## 2020-02-25 LAB — URINALYSIS, ROUTINE W REFLEX MICROSCOPIC
Bilirubin Urine: NEGATIVE
Glucose, UA: NEGATIVE mg/dL
Ketones, ur: NEGATIVE mg/dL
Nitrite: NEGATIVE
Protein, ur: NEGATIVE mg/dL
Specific Gravity, Urine: 1.016 (ref 1.005–1.030)
WBC, UA: >50 WBC/hpf — ABNORMAL HIGH (ref 0–5)
pH: 5 (ref 5.0–8.0)

## 2020-02-25 MED ORDER — DIPHENHYDRAMINE HCL 50 MG/ML IJ SOLN
50.0000 mg | Freq: Once | INTRAMUSCULAR | Status: AC
  Administered 2020-02-25: 50 mg via INTRAVENOUS
  Filled 2020-02-25: qty 1

## 2020-02-25 MED ORDER — FAMOTIDINE IN NACL 20-0.9 MG/50ML-% IV SOLN
20.0000 mg | Freq: Once | INTRAVENOUS | Status: AC
  Administered 2020-02-25: 20 mg via INTRAVENOUS
  Filled 2020-02-25: qty 50

## 2020-02-25 MED ORDER — HEPARIN SOD (PORK) LOCK FLUSH 100 UNIT/ML IV SOLN
500.0000 [IU] | Freq: Once | INTRAVENOUS | Status: AC | PRN
  Administered 2020-02-25: 500 [IU]

## 2020-02-25 MED ORDER — SODIUM CHLORIDE 0.9 % IV SOLN: Freq: Once | INTRAVENOUS | Status: AC

## 2020-02-25 MED ORDER — SODIUM CHLORIDE 0.9 % IV SOLN
80.0000 mg/m2 | Freq: Once | INTRAVENOUS | Status: AC
  Administered 2020-02-25: 192 mg via INTRAVENOUS
  Filled 2020-02-25: qty 32

## 2020-02-25 MED ORDER — LANREOTIDE ACETATE 120 MG/0.5ML ~~LOC~~ SOLN
SUBCUTANEOUS | Status: AC
  Filled 2020-02-25: qty 120

## 2020-02-25 MED ORDER — OCTREOTIDE ACETATE 30 MG IM KIT
PACK | INTRAMUSCULAR | Status: AC
  Filled 2020-02-25: qty 1

## 2020-02-25 MED ORDER — CIPROFLOXACIN HCL 500 MG PO TABS: 500.0000 mg | ORAL_TABLET | Freq: Two times a day (BID) | ORAL | 0 refills | Status: DC

## 2020-02-25 MED ORDER — SODIUM CHLORIDE 0.9 % IV SOLN
10.0000 mg | Freq: Once | INTRAVENOUS | Status: AC
  Administered 2020-02-25: 10 mg via INTRAVENOUS
  Filled 2020-02-25: qty 10

## 2020-02-25 MED ORDER — CYANOCOBALAMIN 1000 MCG/ML IJ SOLN
INTRAMUSCULAR | Status: AC
  Filled 2020-02-25: qty 1

## 2020-02-25 MED ORDER — SODIUM CHLORIDE 0.9% FLUSH
10.0000 mL | INTRAVENOUS | Status: DC | PRN
  Administered 2020-02-25: 10 mL

## 2020-02-25 MED ORDER — SODIUM CHLORIDE 0.9 % IV SOLN
Freq: Once | INTRAVENOUS | Status: AC
  Administered 2020-02-25: 8 mg via INTRAVENOUS
  Filled 2020-02-25: qty 4

## 2020-02-25 NOTE — Patient Instructions (Signed)
[facility] at [facility] Discharge Instructions  Labs drawn from portacath today   Thank you for choosing [facility] at [facility] to provide your oncology and hematology care.  To afford each patient quality time with our provider, please arrive at least 15 minutes before your scheduled appointment time.   If you have a lab appointment with the Cancer Center please come in thru the Main Entrance and check in at the main information desk.  You need to re-schedule your appointment should you arrive 10 or more minutes late.  We strive to give you quality time with our providers, and arriving late affects you and other patients whose appointments are after yours.  Also, if you no show three or more times for appointments you may be dismissed from the clinic at the providers discretion.     Again, thank you for choosing [facility].  Our hope is that these requests will decrease the amount of time that you wait before being seen by our physicians.       _____________________________________________________________  Should you have questions after your visit to [facility], please contact our office at [phone] and follow the prompts.  Our office hours are 8:00 a.m. and 4:30 p.m. Monday - Friday.  Please note that voicemails left after 4:00 p.m. may not be returned until the following business day.  We are closed weekends and major holidays.  You do have access to a nurse 24-7, just call the main number to the clinic [phone] and do not press any options, hold on the line and a nurse will answer the phone.    For prescription refill requests, have your pharmacy contact our office and allow 72 hours.    Due to Covid, you will need to wear a mask upon entering the hospital. If you do not have a mask, a mask will be given to you at the Main Entrance upon arrival. For doctor visits, patients may have 1 support person age 18  or older with them. For treatment visits, patients can not have anyone with them due to social distancing guidelines and our immunocompromised population.     

## 2020-02-25 NOTE — Progress Notes (Signed)
Message received from Solana RN/ Dr. Delton Coombes to proceed with treatment. Labs reviewed. Message received to send a Urinalysis for Dysuria. Vital signs stable.   Treatment given today per MD orders. Tolerated infusion without adverse affects. Vital signs stable. No complaints at this time. Discharged from clinic ambulatory in stable condition. Alert and oriented x 3. F/U with [facility] as scheduled.

## 2020-02-25 NOTE — Patient Instructions (Signed)
[facility] Discharge Instructions for Patients Receiving Chemotherapy  Today you received the following chemotherapy agents   To help prevent nausea and vomiting after your treatment, we encourage you to take your nausea medication {CHL ONC AP TAKE HOME MEDS   If you develop nausea and vomiting that is not controlled by your nausea medication, call the clinic.   BELOW ARE SYMPTOMS THAT SHOULD BE REPORTED IMMEDIATELY:  *FEVER GREATER THAN 100.5 F  *CHILLS WITH OR WITHOUT FEVER  NAUSEA AND VOMITING THAT IS NOT CONTROLLED WITH YOUR NAUSEA MEDICATION  *UNUSUAL SHORTNESS OF BREATH  *UNUSUAL BRUISING OR BLEEDING  TENDERNESS IN MOUTH AND THROAT WITH OR WITHOUT PRESENCE OF ULCERS  *URINARY PROBLEMS  *BOWEL PROBLEMS  UNUSUAL RASH Items with * indicate a potential emergency and should be followed up as soon as possible.  Feel free to call the clinic should you have any questions or concerns. The clinic phone number is [phone].  Please show the [place] at check-in to the Emergency Department and triage nurse.

## 2020-02-25 NOTE — Patient Instructions (Signed)
[facility] at [facility] Discharge Instructions  You were seen today by Dr. Delton Coombes. He went over your recent results. You received your treatment today; continue receiving your weekly treatments. You will be scheduled for a CT scan of your chest and abdomen before your next visit. Dr. Delton Coombes will see you back in 4 weeks for labs and follow up.   Thank you for choosing [place] at [facility] to provide your oncology and hematology care.  To afford each patient quality time with our provider, please arrive at least 15 minutes before your scheduled appointment time.   If you have a lab appointment with the [facility] please come in thru the Main Entrance and check in at the main information desk  You need to re-schedule your appointment should you arrive 10 or more minutes late.  We strive to give you quality time with our providers, and arriving late affects you and other patients whose appointments are after yours.  Also, if you no show three or more times for appointments you may be dismissed from the clinic at the providers discretion.     Again, thank you for choosing [facility].  Our hope is that these requests will decrease the amount of time that you wait before being seen by our physicians.       _____________________________________________________________  Should you have questions after your visit to [facility], please contact our office at [phone] between the hours of 8:00 a.m. and 4:30 p.m.  Voicemails left after 4:00 p.m. will not be returned until the following business day.  For prescription refill requests, have your pharmacy contact our office and allow 72 hours.    Cancer Center Support Programs:   > Cancer Support Group  2nd Tuesday of the month 1pm-2pm, Journey Room

## 2020-02-25 NOTE — Progress Notes (Signed)
Patient was assessed by Dr. Katragadda and labs have been reviewed.  Patient is okay to proceed with treatment today. Primary RN and pharmacy aware.   

## 2020-02-25 NOTE — Progress Notes (Signed)
[place], [place] [address]   CLINIC:  Medical Oncology/Hematology  PCP:  Lemmie Evens, MD [address] / [place] [address] [phone]   REASON FOR VISIT:  Follow-up for left breast cancer  PRIOR THERAPY: None  NGS Results: Not done  CURRENT THERAPY: Paclitaxel 3 weeks on, 1 week off  BRIEF ONCOLOGIC HISTORY:  Oncology History  Malignant neoplasm of overlapping sites of left breast in female, estrogen receptor positive ([place])  11/16/2019 Cancer Staging   Staging form: Breast, AJCC 8th Edition - Clinical stage from 11/16/2019: Stage IV (cT4b, cN1, cM1, G3, ER+, PR-, HER2-) - Signed by [name], MD on 12/17/2019   11/28/2019 Initial Diagnosis   Patient noted intermittent left breast pain and swelling for several months. Mammogram showed a 6.2cm mass in the left breast axillary tail with an adjacent 0.9cm mass, 2 abnormal left axillary lymph nodes, 1 abnormal right axillary lymph node, and diffuse left breast edema and skin thickening. Biopsy showed IDC, grade 3 in the left breast and bilateral axillas, HER-2 negative (1+), ER+ 100%, PR- 0%, Ki67 30%.    12/31/2019 -  Chemotherapy   The patient had ondansetron (ZOFRAN) 8 mg in sodium chloride 0.9 % 50 mL IVPB, , Intravenous,  Once, 2 of 4 cycles Administration: 8 mg (12/31/2019), 8 mg (01/07/2020), 8 mg (01/28/2020), 8 mg (02/05/2020), 8 mg (01/14/2020), 8 mg (02/11/2020) PACLitaxel (TAXOL) 192 mg in sodium chloride 0.9 % 250 mL chemo infusion (</= 60m/m2), 80 mg/m2 = 192 mg, Intravenous,  Once, 2 of 4 cycles Administration: 192 mg (12/31/2019), 192 mg (01/07/2020), 192 mg (01/28/2020), 192 mg (02/05/2020), 192 mg (01/14/2020), 192 mg (02/11/2020)  for chemotherapy treatment.    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 female, returns for routine follow-up and consideration for next cycle of chemotherapy. ATanajahwas last seen on 01/28/2020.  Due for cycle #3 of paclitaxel today.   Overall, she tells me she has been feeling pretty well. She tolerated the previous treatment well. She complains of having burning after urination but denies having pain with urination, all of which started last week. Her cough has also worsened, especially when she gets up; it was mildly productive with a little white sputum. She reports that her left arm does not hurt as much since she started chemo and is not taking Dilaudid at all. Her left hand is always numb, but she reports having intermittent numbness in her right hand and feet. She denies hematuria or hematochezia.  Overall, she feels ready for next cycle of chemo today.  REVIEW OF SYSTEMS:  Review of Systems  Constitutional: Positive for appetite change (mildly decreased) and fatigue (moderate).  Respiratory: Positive for cough (mildly productive w/ white sputum) and shortness of breath (w/ exertion).   Gastrointestinal: Negative for blood in stool.  Genitourinary: Positive for difficulty urinating. Negative for hematuria.   Neurological: Positive  for numbness (constant in L hand, intermittent in R hand and feet).  All other systems reviewed and are negative.   PAST MEDICAL/SURGICAL HISTORY:  Past Medical History:  Diagnosis Date  . Anemia   . Arthritis    per patient " left knee"  . Essential hypertension, benign   . Family history of cancer of female genital organ   . Family history of GI tract cancer   . Metastatic breast cancer ([place])    left breast  . Port-A-Cath in place 12/25/2019  . Type 2 diabetes mellitus ([place])    Past Surgical History:  Procedure Laterality Date  . CATARACT EXTRACTION W/ INTRAOCULAR LENS IMPLANT Right   . HYSTEROSCOPY WITH D & C N/A 06/05/2014   Procedure: DILATATION AND CURETTAGE /HYSTEROSCOPY;  Surgeon: Florian Buff, MD;  Location: [facility];  Service: Gynecology;  Laterality: N/A;  . POLYPECTOMY N/A 06/05/2014   Procedure: ENDOMETRIAL POLYPECTOMY;  Surgeon: Florian Buff, MD;  Location: [facility];  Service: Gynecology;  Laterality: N/A;  . PORTACATH PLACEMENT N/A 12/14/2019   Procedure: INSERTION PORT-A-CATH WITH ULTRASOUND GUIDANCE;  Surgeon: Donnie Mesa, MD;  Location: [facility];  Service: General;  Laterality: N/A;  . TUBAL LIGATION      SOCIAL HISTORY:  Social History   Socioeconomic History  . Marital status: Married    Spouse name: Herbie Baltimore  . Number of children: 2  . Years of education: Not on file  . Highest education level: Not on file  Occupational History  . Occupation: retired  Tobacco Use  . Smoking status: Former Smoker    Types: Cigarettes  . Smokeless tobacco: Never Used  . Tobacco comment: quit about 40+ years ago  Vaping Use  . Vaping Use: Never used  Substance and Sexual Activity  . Alcohol use: Never  . Drug use: No  . Sexual activity: Not on file  Other Topics Concern  . Not on file  Social History Narrative  . Not on file   Social Determinants of Health   Financial Resource Strain: Low Risk   . Difficulty of Paying Living Expenses: Not hard at all  Food  Insecurity: No Food Insecurity  . Worried About Charity fundraiser in the Last Year: Never true  . Ran Out of Food in the Last Year: Never true  Transportation Needs: No Transportation Needs  . Lack of Transportation (Medical): No  . Lack of Transportation (Non-Medical): No  Physical Activity: Inactive  . Days of Exercise per Week: 0 days  . Minutes of Exercise per Session: 0 min  Stress: No Stress Concern Present  . Feeling of Stress : Only a little  Social Connections: Moderately Integrated  . Frequency of Communication with Friends and Family: Three times a week  . Frequency of Social Gatherings with Friends and Family: Three times a week  . Attends Religious Services: 1 to 4 times per year  . Active Member of Clubs or Organizations: No  . Attends Archivist Meetings: Never  . Marital Status: Married  Human resources officer Violence: Not At Risk  . Fear of Current or Ex-Partner: No  . Emotionally Abused: No  . Physically Abused:  No  . Sexually Abused: No    FAMILY HISTORY:  Family History  Problem Relation Age of Onset  . Diabetes Mellitus II Father   . Congestive Heart Failure Father   . Cancer Mother        Deceased at age 74; patient not sure if cancer was in her stomach or colon  . Stroke Maternal Grandmother   . Cancer Cousin        female reproductive cancer, dx. 50s/60s    CURRENT MEDICATIONS:  Current Outpatient Medications  Medication Sig Dispense Refill  . ferrous sulfate 325 (65 FE) MG tablet Take 325 mg by mouth daily with breakfast.    . hydrochlorothiazide (HYDRODIURIL) 25 MG tablet Take 25 mg by mouth daily.    Marland Kitchen HYDROcodone-acetaminophen (NORCO/VICODIN) 5-325 MG tablet Take 1 tablet by mouth every 6 (six) hours as needed for moderate pain. 15 tablet 0  . HYDROmorphone (DILAUDID) 2 MG tablet Take 1 tablet (2 mg total) by mouth every 4 (four) hours as needed for severe pain. 30 tablet 0  . ibuprofen (ADVIL,MOTRIN) 800 MG tablet Take 800 mg by mouth  every 8 (eight) hours as needed for moderate pain.     Marland Kitchen lidocaine-prilocaine (EMLA) cream Apply a small amount to port a cath site and cover with plastic wrap 1 hour prior to chemotherapy appointments 30 g 3  . losartan (COZAAR) 50 MG tablet Take 50 mg by mouth daily.    . metFORMIN (GLUCOPHAGE) 500 MG tablet Take 500 mg by mouth daily with breakfast.     . oxybutynin (DITROPAN) 5 MG tablet Take 5 mg by mouth daily.     Marland Kitchen PACLitaxel (TAXOL IV) Inject 80 mg/m2 into the vein once a week. Day 1, 8, 15 every 28 days    . potassium chloride (MICRO-K) 10 MEQ CR capsule Take 10 mEq by mouth daily.     . pravastatin (PRAVACHOL) 10 MG tablet Take 10 mg by mouth daily.     . prochlorperazine (COMPAZINE) 10 MG tablet Take 1 tablet (10 mg total) by mouth every 6 (six) hours as needed (Nausea or vomiting). 30 tablet 1   No current facility-administered medications for this visit.    ALLERGIES:  No Known Allergies  PHYSICAL EXAM:  Performance status (ECOG): 1 - Symptomatic but completely ambulatory  Vitals:   02/25/20 0850  BP: 123/85  Pulse: (!) 109  Resp: 20  Temp: (!) 97 F (36.1 C)  SpO2: 97%   Wt Readings from Last 3 Encounters:  02/25/20 285 lb 9.6 oz (129.5 kg)  02/11/20 281 lb 12.8 oz (127.8 kg)  02/05/20 281 lb (127.5 kg)   Physical Exam Vitals reviewed.  Constitutional:      Appearance: Normal appearance. She is obese.  Cardiovascular:     Rate and Rhythm: Normal rate and regular rhythm.     Pulses: Normal pulses.     Heart sounds: Normal heart sounds.  Pulmonary:     Effort: Pulmonary effort is normal.     Breath sounds: Normal breath sounds.  Chest:     Breasts:        Left: Mass (stable indurated upper outer mass) present. No skin change.     Comments: Port-a-Cath in R chest Neurological:     General: No focal deficit present.     Mental Status: She is alert and oriented to person, place, and time.  Psychiatric:        Mood and Affect: Mood normal.  Behavior: Behavior normal.     LABORATORY DATA:  I have reviewed the labs as listed.  CBC Latest Ref Rng & Units 02/25/2020 02/11/2020 02/05/2020  WBC 4.0 - 10.5 K/uL 7.3 4.1 6.3  Hemoglobin 12.0 - 15.0 g/dL 10.7(L) 10.4(L) 11.0(L)  Hematocrit 36 - 46 % 33.1(L) 32.3(L) 33.8(L)  Platelets 150 - 400 K/uL 248 268 286   CMP Latest Ref Rng & Units 02/11/2020 02/05/2020 01/28/2020  Glucose 70 - 99 mg/dL 130(H) 134(H) 128(H)  BUN 8 - 23 mg/dL _0 Creatinine 0.44 - 1.00 mg/dL 0.62 0.71 0.69  Sodium 135 - 145 mmol/L 136 136 136  Potassium 3.5 - 5.1 mmol/L 3.6 3.5 3.4(L)  Chloride 98 - 111 mmol/L 101 99 102  CO2 22 - 32 mmol/L _1 Calcium 8.9 - 10.3 mg/dL 9.2 9.2 8.8(L)  Total Protein 6.5 - 8.1 g/dL 7.0 7.4 7.3  Total Bilirubin 0.3 - 1.2 mg/dL 0.5 0.9 0.5  Alkaline Phos 38 - 126 U/L 65 67 77  AST 15 - 41 U/L _2 ALT 0 - 44 U/L _3 DIAGNOSTIC IMAGING:  I have independently reviewed the scans and discussed with the patient. No results found.   ASSESSMENT:  1. 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 -MRI of the breast on 12/10/2019 shows left breast malignancy involving all 4 quadrants and skin of the left breast including 0.5 cm biopsy-proven malignancy within the far posterior outer right breast, abnormal highly suspicious nonmuscle like enhancement within all 4 quadrants of the left breast and diffuse skin thickening. This abnormal non-mass-like enhancement and skin thickening extends across the midline to the medial right breast. At least 7 abnormal left axillary lymph nodes and 1 abnormal right axillary lymph node compatible with metastatic disease. -CT CAP on 12/12/2019 shows bulky lymphadenopathy in the left subpectoral and axillary regions with largest lymph  node measuring 3.3 cm. Mild mediastinal adenopathy in the right paratracheal prevascular and lateral aortic regions, largest lymph node in the lateral aortic region measuring 2.4 cm. Lymphadenopathy in the inferior jugular and supraclavicular regions, left side greater than right. Mild lymphadenopathy in the right axilla. -Multiple small hypervascular lesions seen in the right and left lobesconsistent with liver metastasis. -Bone scan on 12/12/2019 did not show any bone metastasis. -CT CAP on 12/29/2019 showed possible lytic lesion in the T4 vertebral body. -Liver biopsy on 12/28/2019 consistent with metastatic breast cancer. -Paclitaxel weekly 3 weeks on 1 week off started on 12/31/2019.  2. Right colonic mass: -CT scan showed constricting mass involving the ascending colon measuring 3.6 x 3.5 cm with a small 1 cm right pericolonic lymph node. -She never had a colonoscopy. Denies any bleeding per rectum or melena.   PLAN:  1. Metastatic left breast cancerto the liver: -She has tolerated 2 cycles of paclitaxel reasonably well. -Physical examination today shows improved erythema of the left breast although induration is still present.  No palpable adenopathy. -Patient reported improvement in the left upper extremity pain. -Reviewed labs which showed normal white count and platelet count.  LFTs were grossly normal with slightly low albumin. -She will proceed with cycle 3 of paclitaxel, 3 weeks on 1 week off today. -I plan to see her back in 1 month.  I plan to repeat CT CAP  prior to next visit.  2. Right colonic mass: -Follow-up with GI regarding colonoscopy.  3. Left breast and left upper extremity pain: -Pain has improved after last cycle of chemotherapy.  She is not requiring Dilaudid 2 mg on regular basis.  4.  UTI: -Reported some burning urination.  Checked her urine which showed positive leukocytes and bacteria.  We will start her on Cipro 500 twice daily for 5 days.    Orders placed this encounter:  Orders Placed This Encounter  Procedures  . Urinalysis, Routine w reflex microscopic     [name], MD [place] [phone]   I, Milinda Antis, am acting as a scribe for Dr. Sanda Linger.  I, [name] MD, have reviewed the above documentation for accuracy and completeness, and I agree with the above.

## 2020-02-26 MED ORDER — INFLUENZA VAC A&B SA ADJ QUAD 0.5 ML IM PRSY
PREFILLED_SYRINGE | INTRAMUSCULAR | Status: AC
  Filled 2020-02-26: qty 0.5

## 2020-03-03 ENCOUNTER — Encounter (HOSPITAL_COMMUNITY): Payer: Self-pay

## 2020-03-03 ENCOUNTER — Other Ambulatory Visit: Payer: Self-pay

## 2020-03-03 ENCOUNTER — Inpatient Hospital Stay (HOSPITAL_COMMUNITY): Payer: Medicare HMO | Attending: Hematology

## 2020-03-03 ENCOUNTER — Inpatient Hospital Stay (HOSPITAL_COMMUNITY): Payer: Medicare HMO

## 2020-03-03 ENCOUNTER — Ambulatory Visit (HOSPITAL_COMMUNITY)
Admission: RE | Admit: 2020-03-03 | Discharge: 2020-03-03 | Disposition: A | Discharge status: Home / Self Care | Payer: Medicare HMO | Source: Ambulatory Visit | Attending: Hematology | Admitting: Hematology

## 2020-03-03 ENCOUNTER — Inpatient Hospital Stay (HOSPITAL_BASED_OUTPATIENT_CLINIC_OR_DEPARTMENT_OTHER): Payer: Medicare HMO | Admitting: Hematology

## 2020-03-03 ENCOUNTER — Telehealth (HOSPITAL_COMMUNITY): Payer: Self-pay

## 2020-03-03 VITALS — BP 111/55 | HR 96 | Temp 98.3°F | Resp 19

## 2020-03-03 DIAGNOSIS — I824Y2 Acute embolism and thrombosis of unspecified deep veins of left proximal lower extremity: Secondary | ICD-10-CM

## 2020-03-03 DIAGNOSIS — Z170 Estrogen receptor positive status [ER+]: Secondary | ICD-10-CM | POA: Diagnosis not present

## 2020-03-03 DIAGNOSIS — Z5111 Encounter for antineoplastic chemotherapy: Secondary | ICD-10-CM | POA: Diagnosis present

## 2020-03-03 DIAGNOSIS — C50611 Malignant neoplasm of axillary tail of right female breast: Secondary | ICD-10-CM

## 2020-03-03 DIAGNOSIS — E118 Type 2 diabetes mellitus with unspecified complications: Secondary | ICD-10-CM | POA: Insufficient documentation

## 2020-03-03 DIAGNOSIS — Z23 Encounter for immunization: Secondary | ICD-10-CM | POA: Diagnosis not present

## 2020-03-03 DIAGNOSIS — Z95828 Presence of other vascular implants and grafts: Secondary | ICD-10-CM

## 2020-03-03 DIAGNOSIS — I82402 Acute embolism and thrombosis of unspecified deep veins of left lower extremity: Secondary | ICD-10-CM | POA: Insufficient documentation

## 2020-03-03 DIAGNOSIS — M1990 Unspecified osteoarthritis, unspecified site: Secondary | ICD-10-CM | POA: Diagnosis not present

## 2020-03-03 DIAGNOSIS — I10 Essential (primary) hypertension: Secondary | ICD-10-CM | POA: Insufficient documentation

## 2020-03-03 DIAGNOSIS — C50411 Malignant neoplasm of upper-outer quadrant of right female breast: Secondary | ICD-10-CM | POA: Insufficient documentation

## 2020-03-03 DIAGNOSIS — Z87891 Personal history of nicotine dependence: Secondary | ICD-10-CM | POA: Diagnosis not present

## 2020-03-03 DIAGNOSIS — C50812 Malignant neoplasm of overlapping sites of left female breast: Secondary | ICD-10-CM | POA: Insufficient documentation

## 2020-03-03 DIAGNOSIS — I82442 Acute embolism and thrombosis of left tibial vein: Secondary | ICD-10-CM | POA: Insufficient documentation

## 2020-03-03 DIAGNOSIS — G893 Neoplasm related pain (acute) (chronic): Secondary | ICD-10-CM | POA: Diagnosis not present

## 2020-03-03 DIAGNOSIS — C773 Secondary and unspecified malignant neoplasm of axilla and upper limb lymph nodes: Secondary | ICD-10-CM | POA: Diagnosis not present

## 2020-03-03 DIAGNOSIS — I82412 Acute embolism and thrombosis of left femoral vein: Secondary | ICD-10-CM | POA: Diagnosis not present

## 2020-03-03 LAB — CBC WITH DIFFERENTIAL/PLATELET
Abs Immature Granulocytes: 0.09 10*3/uL — ABNORMAL HIGH (ref 0.00–0.07)
Basophils Absolute: 0.1 10*3/uL (ref 0.0–0.1)
Basophils Relative: 1 %
Eosinophils Absolute: 0 10*3/uL (ref 0.0–0.5)
Eosinophils Relative: 1 %
HCT: 30.6 % — ABNORMAL LOW (ref 36.0–46.0)
Hemoglobin: 10.2 g/dL — ABNORMAL LOW (ref 12.0–15.0)
Immature Granulocytes: 1 %
Lymphocytes Relative: 27 %
Lymphs Abs: 1.8 10*3/uL (ref 0.7–4.0)
MCH: 28.8 pg (ref 26.0–34.0)
MCHC: 33.3 g/dL (ref 30.0–36.0)
MCV: 86.4 fL (ref 80.0–100.0)
Monocytes Absolute: 0.4 10*3/uL (ref 0.1–1.0)
Monocytes Relative: 6 %
Neutro Abs: 4.2 10*3/uL (ref 1.7–7.7)
Neutrophils Relative %: 64 %
Platelets: 248 10*3/uL (ref 150–400)
RBC: 3.54 MIL/uL — ABNORMAL LOW (ref 3.87–5.11)
RDW: 16.2 % — ABNORMAL HIGH (ref 11.5–15.5)
WBC: 6.5 10*3/uL (ref 4.0–10.5)
nRBC: 0 % (ref 0.0–0.2)

## 2020-03-03 LAB — COMPREHENSIVE METABOLIC PANEL
ALT: 23 U/L (ref 0–44)
AST: 27 U/L (ref 15–41)
Albumin: 3.4 g/dL — ABNORMAL LOW (ref 3.5–5.0)
Alkaline Phosphatase: 71 U/L (ref 38–126)
Anion gap: 11 (ref 5–15)
BUN: 20 mg/dL (ref 8–23)
CO2: 25 mmol/L (ref 22–32)
Calcium: 8.8 mg/dL — ABNORMAL LOW (ref 8.9–10.3)
Chloride: 98 mmol/L (ref 98–111)
Creatinine, Ser: 0.7 mg/dL (ref 0.44–1.00)
GFR calc Af Amer: >60 mL/min (ref >60)
GFR calc non Af Amer: >60 mL/min (ref >60)
Glucose, Bld: 169 mg/dL — ABNORMAL HIGH (ref 70–99)
Potassium: 3.3 mmol/L — ABNORMAL LOW (ref 3.5–5.1)
Sodium: 134 mmol/L — ABNORMAL LOW (ref 135–145)
Total Bilirubin: 0.6 mg/dL (ref 0.3–1.2)
Total Protein: 7 g/dL (ref 6.5–8.1)

## 2020-03-03 LAB — URINALYSIS, ROUTINE W REFLEX MICROSCOPIC
Bacteria, UA: NONE SEEN
Bilirubin Urine: NEGATIVE
Glucose, UA: NEGATIVE mg/dL
Ketones, ur: NEGATIVE mg/dL
Leukocytes,Ua: NEGATIVE
Nitrite: NEGATIVE
Protein, ur: NEGATIVE mg/dL
Specific Gravity, Urine: 1.012 (ref 1.005–1.030)
pH: 5 (ref 5.0–8.0)

## 2020-03-03 MED ORDER — APIXABAN 5 MG PO TABS: ORAL_TABLET | ORAL | 0 refills | Status: DC

## 2020-03-03 MED ORDER — FAMOTIDINE IN NACL 20-0.9 MG/50ML-% IV SOLN
20.0000 mg | Freq: Once | INTRAVENOUS | Status: AC
  Administered 2020-03-03: 20 mg via INTRAVENOUS

## 2020-03-03 MED ORDER — ENOXAPARIN SODIUM 120 MG/0.8ML ~~LOC~~ SOLN
120.0000 mg | Freq: Once | SUBCUTANEOUS | Status: AC
  Administered 2020-03-03: 120 mg via SUBCUTANEOUS
  Filled 2020-03-03: qty 0.8

## 2020-03-03 MED ORDER — SODIUM CHLORIDE 0.9 % IV SOLN
80.0000 mg/m2 | Freq: Once | INTRAVENOUS | Status: AC
  Administered 2020-03-03: 192 mg via INTRAVENOUS
  Filled 2020-03-03: qty 32

## 2020-03-03 MED ORDER — SODIUM CHLORIDE 0.9 % IV SOLN
Freq: Once | INTRAVENOUS | Status: AC
  Administered 2020-03-03: 8 mg via INTRAVENOUS
  Filled 2020-03-03: qty 4

## 2020-03-03 MED ORDER — SODIUM CHLORIDE 0.9% FLUSH
10.0000 mL | INTRAVENOUS | Status: DC | PRN
  Administered 2020-03-03: 10 mL

## 2020-03-03 MED ORDER — DIPHENHYDRAMINE HCL 50 MG/ML IJ SOLN
INTRAMUSCULAR | Status: AC
  Filled 2020-03-03: qty 1

## 2020-03-03 MED ORDER — FAMOTIDINE IN NACL 20-0.9 MG/50ML-% IV SOLN
INTRAVENOUS | Status: AC
  Filled 2020-03-03: qty 50

## 2020-03-03 MED ORDER — DIPHENHYDRAMINE HCL 50 MG/ML IJ SOLN
50.0000 mg | Freq: Once | INTRAMUSCULAR | Status: AC
  Administered 2020-03-03: 50 mg via INTRAVENOUS

## 2020-03-03 MED ORDER — SODIUM CHLORIDE 0.9 % IV SOLN: Freq: Once | INTRAVENOUS | Status: AC

## 2020-03-03 MED ORDER — HEPARIN SOD (PORK) LOCK FLUSH 100 UNIT/ML IV SOLN
500.0000 [IU] | Freq: Once | INTRAVENOUS | Status: AC | PRN
  Administered 2020-03-03: 500 [IU]

## 2020-03-03 MED ORDER — INFLUENZA VAC A&B SA ADJ QUAD 0.5 ML IM PRSY
0.5000 mL | PREFILLED_SYRINGE | Freq: Once | INTRAMUSCULAR | Status: AC
  Administered 2020-03-03: 0.5 mL via INTRAMUSCULAR
  Filled 2020-03-03: qty 0.5

## 2020-03-03 MED ORDER — SODIUM CHLORIDE 0.9 % IV SOLN
10.0000 mg | Freq: Once | INTRAVENOUS | Status: AC
  Administered 2020-03-03: 10 mg via INTRAVENOUS
  Filled 2020-03-03: qty 10

## 2020-03-03 NOTE — Patient Instructions (Signed)
[place] at [facility] Discharge Instructions  You were seen today by Dr. Delton Coombes. He went over your recent results. You received your treatment today; continue getting your weekly treatments. You will be put on an injection, Lovenox, as well as Eliquis, to take for your blood clot. Dr. Delton Coombes will see you back during your next visit for labs and follow up.   Thank you for choosing [place] at [facility] to provide your oncology and hematology care.  To afford each patient quality time with our provider, please arrive at least 15 minutes before your scheduled appointment time.   If you have a lab appointment with the [facility] please come in thru the Main Entrance and check in at the main information desk  You need to re-schedule your appointment should you arrive 10 or more minutes late.  We strive to give you quality time with our providers, and arriving late affects you and other patients whose appointments are after yours.  Also, if you no show three or more times for appointments you may be dismissed from the clinic at the providers discretion.     Again, thank you for choosing [facility].  Our hope is that these requests will decrease the amount of time that you wait before being seen by our physicians.       _____________________________________________________________  Should you have questions after your visit to [facility], please contact our office at [phone] between the hours of 8:00 a.m. and 4:30 p.m.  Voicemails left after 4:00 p.m. will not be returned until the following business day.  For prescription refill requests, have your pharmacy contact our office and allow 72 hours.    Cancer Center Support Programs:   > Cancer Support Group  2nd Tuesday of the month 1pm-2pm, Journey Room

## 2020-03-03 NOTE — Progress Notes (Signed)
Pt is for D8C3 of taxol for her breast cancer.  Pt does want her flu shot today.  Pt is still complaining of urinary symptoms despite being done with antibiotics for UTI.  Will order urinalysis. Pt has also been complaining of left calf pain since Thursday.  Pt says that it is tender in the area, but no pink/reddness noted nor is it warm to touch.   Dr Raliegh Ip notified. We will order a [place] of her left leg.   Labs within parameters for treatment today.  Potassium 3.3. Okay to proceed with treatment.   Pt will go down to ultrasound after treatment today.   Tolerated treatment well today without incidence.  Vital signs stab;e prior to discharge.  Discharge in stable condition via wheelchair and taken to radiology.

## 2020-03-03 NOTE — Patient Instructions (Signed)
[facility] Discharge Instructions for Patients Receiving Chemotherapy  Today you received the following chemotherapy agents   To help prevent nausea and vomiting after your treatment, we encourage you to take your nausea medication   If you develop nausea and vomiting that is not controlled by your nausea medication, call the clinic.   BELOW ARE SYMPTOMS THAT SHOULD BE REPORTED IMMEDIATELY:  *FEVER GREATER THAN 100.5 F  *CHILLS WITH OR WITHOUT FEVER  NAUSEA AND VOMITING THAT IS NOT CONTROLLED WITH YOUR NAUSEA MEDICATION  *UNUSUAL SHORTNESS OF BREATH  *UNUSUAL BRUISING OR BLEEDING  TENDERNESS IN MOUTH AND THROAT WITH OR WITHOUT PRESENCE OF ULCERS  *URINARY PROBLEMS  *BOWEL PROBLEMS  UNUSUAL RASH Items with * indicate a potential emergency and should be followed up as soon as possible.  Feel free to call the clinic should you have any questions or concerns. The clinic phone number is [phone].  Please show the CHEMO ALERT CARD at check-in to the Emergency Department and triage nurse.   

## 2020-03-03 NOTE — Progress Notes (Signed)
Crystal Vega presents today for injection per the provider's orders.  lovenox 120 mg in abdominal tissue administration without incident; injection site WNL; see MAR for injection details.  Patient tolerated procedure well and without incident.  No questions or complaints noted at this time.  Eliquis is being sent to the pharmacy for her to start tomorrow.  She is suppose to call us and let us know whether she got the medication.

## 2020-03-03 NOTE — Telephone Encounter (Signed)
..  CRITICAL VALUE STICKER  CRITICAL VALUE: [facility] radiology called with report on patients [place] results at 14:36.   IMPRESSION: Deep venous thrombosis within the LEFT femoral vein, popliteal vein and posterior tibial vein. Thrombus is occlusive within the popliteal vein and posterior tibial vein.  Dr. Francis Gaines are aware.

## 2020-03-03 NOTE — Addendum Note (Signed)
Addended by: Joanne Gavel T on: 03/03/2020 04:10 PM   Modules accepted: Orders

## 2020-03-03 NOTE — Progress Notes (Signed)
Crystal Vega, [place] [address]   CLINIC:  Medical Oncology/Hematology  PCP:  Crystal Evens, Vega [address] / [place] [address] [phone]   REASON FOR VISIT:  Follow-up for left breast cancer  PRIOR THERAPY: None  NGS Results: Not done  CURRENT THERAPY: Paclitaxel 3 weeks on, 1 week off  BRIEF ONCOLOGIC HISTORY:  Oncology History  Malignant neoplasm of overlapping sites of left breast in female, estrogen receptor positive ([place])  11/16/2019 Cancer Staging   Staging form: Breast, AJCC 8th Edition - Clinical stage from 11/16/2019: Stage IV (cT4b, cN1, cM1, G3, ER+, PR-, HER2-) - Signed by Crystal Jack, Vega on 12/17/2019   11/28/2019 Initial Diagnosis   Patient noted intermittent left breast pain and swelling for several months. Mammogram showed a 6.2cm mass in the left breast axillary tail with an adjacent 0.9cm mass, 2 abnormal left axillary lymph nodes, 1 abnormal right axillary lymph node, and diffuse left breast edema and skin thickening. Biopsy showed IDC, grade 3 in the left breast and bilateral axillas, HER-2 negative (1+), ER+ 100%, PR- 0%, Ki67 30%.    12/31/2019 -  Chemotherapy   The patient had ondansetron (ZOFRAN) 8 mg in sodium chloride 0.9 % 50 mL IVPB, , Intravenous,  Once, 3 of 4 cycles Administration: 8 mg (12/31/2019), 8 mg (01/07/2020), 8 mg (01/28/2020), 8 mg (02/05/2020), 8 mg (01/14/2020), 8 mg (02/11/2020), 8 mg (02/25/2020), 8 mg (03/03/2020) PACLitaxel (TAXOL) 192 mg in sodium chloride 0.9 % 250 mL chemo infusion (</= 30m/m2), 80 mg/m2 = 192 mg, Intravenous,  Once, 3 of 4 cycles Administration: 192 mg (12/31/2019), 192 mg (01/07/2020), 192 mg (01/28/2020), 192 mg (02/05/2020), 192 mg (01/14/2020), 192 mg (02/11/2020), 192 mg (02/25/2020), 192 mg (03/03/2020)  for chemotherapy treatment.    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 female, returns for routine follow-up and consideration for next cycle of chemotherapy. ADenicewas last seen on 02/25/2020.  Due for day #8 of cycle #3 of paclitaxel today.   Overall, she tells me she has been feeling okay. She reports that she started having left leg pain when she work up on 9/30; her left knee has been chronically swollen. She denies having any dyspnea or CP when breathing. She still has a cough. She denies having any nose bleeds or hematochezia or hematuria.  She stays primarily sedentary throughout the day and will occasionally go to the kitchen to get food. She received a flu shot today.  Overall, she feels ready for next cycle of chemo today.    REVIEW OF SYSTEMS:  Review of Systems  Constitutional: Positive for appetite change and fatigue.  Respiratory: Positive for cough. Negative for chest tightness and shortness of breath.   Cardiovascular: Negative for chest pain.  Gastrointestinal: Negative for blood in stool.  Genitourinary: Negative for hematuria.     PAST MEDICAL/SURGICAL HISTORY:  Past Medical History:  Diagnosis Date  . Anemia   . Arthritis    per  patient " left knee"  . Essential hypertension, benign   . Family history of cancer of female genital organ   . Family history of GI tract cancer   . Metastatic breast cancer ([place])    left breast  . Port-A-Cath in place 12/25/2019  . Type 2 diabetes mellitus ([place])    Past Surgical History:  Procedure Laterality Date  . CATARACT EXTRACTION W/ INTRAOCULAR LENS IMPLANT Right   . HYSTEROSCOPY WITH D & C N/A 06/05/2014   Procedure: DILATATION AND CURETTAGE /HYSTEROSCOPY;  Surgeon: Crystal Buff, Vega;  Location: [facility];  Service: Gynecology;  Laterality: N/A;  . POLYPECTOMY N/A 06/05/2014   Procedure: ENDOMETRIAL POLYPECTOMY;  Surgeon: Crystal Buff, Vega;  Location: [facility];  Service: Gynecology;  Laterality: N/A;  . PORTACATH PLACEMENT N/A 12/14/2019   Procedure: INSERTION PORT-A-CATH WITH ULTRASOUND GUIDANCE;  Surgeon: Crystal Mesa, Vega;  Location: [facility];  Service: General;  Laterality: N/A;  . TUBAL LIGATION      SOCIAL HISTORY:  Social History   Socioeconomic History  . Marital status: Married    Spouse name: Crystal Vega  . Number of children: 2  . Years of education: Not on file  . Highest education level: Not on file  Occupational History  . Occupation: retired  Tobacco Use  . Smoking status: Former Smoker    Types: Cigarettes  . Smokeless tobacco: Never Used  . Tobacco comment: quit about 40+ years ago  Vaping Use  . Vaping Use: Never used  Substance and Sexual Activity  . Alcohol use: Never  . Drug use: No  . Sexual activity: Not on file  Other Topics Concern  . Not on file  Social History Narrative  . Not on file   Social Determinants of Health   Financial Resource Strain: Low Risk   . Difficulty of Paying Living Expenses: Not hard at all  Food Insecurity: No Food Insecurity  . Worried About Charity fundraiser in the Last Year: Never true  . Ran Out of Food in the Last Year: Never true  Transportation Needs: No Transportation Needs  . Lack of Transportation (Medical):  No  . Lack of Transportation (Non-Medical): No  Physical Activity: Inactive  . Days of Exercise per Week: 0 days  . Minutes of Exercise per Session: 0 min  Stress: No Stress Concern Present  . Feeling of Stress : Only a little  Social Connections: Moderately Integrated  . Frequency of Communication with Friends and Family: Three times a week  . Frequency of Social Gatherings with Friends and Family: Three times a week  . Attends Religious Services: 1 to 4 times per year  . Active Member of Clubs or Organizations: No  . Attends Archivist Meetings: Never  . Marital Status: Married  Human resources officer Violence: Not At Risk  . Fear of Current or Ex-Partner: No  . Emotionally Abused: No  . Physically Abused: No  . Sexually Abused: No    FAMILY HISTORY:  Family History  Problem Relation Age of Onset  . Diabetes Mellitus II Father   . Congestive  Heart Failure Father   . Cancer Mother        Deceased at age 62; patient not sure if cancer was in her stomach or colon  . Stroke Maternal Grandmother   . Cancer Cousin        female reproductive cancer, dx. 50s/60s    CURRENT MEDICATIONS:  Current Outpatient Medications  Medication Sig Dispense Refill  . ferrous sulfate 325 (65 FE) MG tablet Take 325 mg by mouth daily with breakfast.    . hydrochlorothiazide (HYDRODIURIL) 25 MG tablet Take 25 mg by mouth daily.    Marland Kitchen HYDROcodone-acetaminophen (NORCO/VICODIN) 5-325 MG tablet Take 1 tablet by mouth every 6 (six) hours as needed for moderate pain. 15 tablet 0  . HYDROmorphone (DILAUDID) 2 MG tablet Take 1 tablet (2 mg total) by mouth every 4 (four) hours as needed for severe pain. 30 tablet 0  . ibuprofen (ADVIL,MOTRIN) 800 MG tablet Take 800 mg by mouth every 8 (eight) hours as needed for moderate pain.     Marland Kitchen lidocaine-prilocaine (EMLA) cream Apply a small amount to port a cath site and cover with plastic wrap 1 hour prior to chemotherapy appointments 30 g 3  . losartan (COZAAR) 50  MG tablet Take 50 mg by mouth daily.    . metFORMIN (GLUCOPHAGE) 500 MG tablet Take 500 mg by mouth daily with breakfast.     . oxybutynin (DITROPAN) 5 MG tablet Take 5 mg by mouth daily.     Marland Kitchen PACLitaxel (TAXOL IV) Inject 80 mg/m2 into the vein once a week. Day 1, 8, 15 every 28 days    . potassium chloride (MICRO-K) 10 MEQ CR capsule Take 10 mEq by mouth daily.     . pravastatin (PRAVACHOL) 10 MG tablet Take 10 mg by mouth daily.     . prochlorperazine (COMPAZINE) 10 MG tablet Take 1 tablet (10 mg total) by mouth every 6 (six) hours as needed (Nausea or vomiting). 30 tablet 1   No current facility-administered medications for this visit.    ALLERGIES:  No Known Allergies  PHYSICAL EXAM:  Performance status (ECOG): 1 - Symptomatic but completely ambulatory  There were no vitals filed for this visit. Wt Readings from Last 3 Encounters:  03/03/20 278 lb 12.8 oz (126.5 kg)  02/25/20 285 lb 9.6 oz (129.5 kg)  02/11/20 281 lb 12.8 oz (127.8 kg)   Physical Exam  LABORATORY DATA:  I have reviewed the labs as listed.  CBC Latest Ref Rng & Units 03/03/2020 02/25/2020 02/11/2020  WBC 4.0 - 10.5 K/uL 6.5 7.3 4.1  Hemoglobin 12.0 - 15.0 g/dL 10.2(L) 10.7(L) 10.4(L)  Hematocrit 36 - 46 % 30.6(L) 33.1(L) 32.3(L)  Platelets 150 - 400 K/uL 248 248 268   CMP Latest Ref Rng & Units 03/03/2020 02/25/2020 02/11/2020  Glucose 70 - 99 mg/dL 169(H) 150(H) 130(H)  BUN 8 - 23 mg/dL 20 18 15   Creatinine 0.44 - 1.00 mg/dL 0.70 0.75 0.62  Sodium 135 - 145 mmol/L 134(L) 136 136  Potassium 3.5 - 5.1 mmol/L 3.3(L) 3.5 3.6  Chloride 98 - 111 mmol/L 98 99 101  CO2 22 - 32 mmol/L 25 27 26   Calcium 8.9 - 10.3 mg/dL 8.8(L) 8.9 9.2  Total Protein 6.5 - 8.1 g/dL 7.0 7.1 7.0  Total Bilirubin 0.3 - 1.2 mg/dL 0.6 0.5 0.5  Alkaline Phos 38 - 126 U/L 71 80 65  AST 15 - 41 U/L 27 27 24   ALT 0 - 44 U/L 23 21 23  DIAGNOSTIC IMAGING:  I have independently reviewed the scans and discussed with the patient. US Venous  Img Lower Unilateral Left  Result Date: 03/03/2020 CLINICAL DATA:  LEFT lower extremity pain and swelling EXAM: LEFT LOWER EXTREMITY VENOUS DOPPLER ULTRASOUND TECHNIQUE: Gray-scale sonography with graded compression, as well as color Doppler and duplex ultrasound were performed to evaluate the lower extremity deep venous systems from the level of the common femoral vein and including the common femoral, femoral, profunda femoral, popliteal and calf veins including the posterior tibial, peroneal and gastrocnemius veins when visible. The superficial great saphenous vein was also interrogated. Spectral Doppler was utilized to evaluate flow at rest and with distal augmentation maneuvers in the common femoral, femoral and popliteal veins. COMPARISON:  None. FINDINGS: Contralateral Common Femoral Vein: Respiratory phasicity is normal and symmetric with the symptomatic side. No evidence of thrombus. Normal compressibility. Common Femoral Vein: No evidence of thrombus. Normal compressibility, respiratory phasicity and response to augmentation. Saphenofemoral Junction: No evidence of thrombus. Normal compressibility and flow on color Doppler imaging. Profunda Femoral Vein: No evidence of thrombus. Normal compressibility and flow on color Doppler imaging. Femoral Vein: Echogenic thrombus within the LEFT femoral vein. This thrombus is partially occlusive. Vein is non compressible. Popliteal Vein: Occlusive thrombus within the popliteal vein. Noncompressible. Calf Veins: Occlusive thrombus in the posterior tibial vein. Noncompressible. Superficial Great Saphenous Vein: No evidence of thrombus. Normal compressibility. Venous Reflux:  None Other Findings:  None. IMPRESSION: Deep venous thrombosis within the LEFT femoral vein, popliteal vein and posterior tibial vein. Thrombus is occlusive within the popliteal vein and posterior tibial vein. These results will be called to the ordering clinician or representative by the  Radiologist Assistant, and communication documented in the PACS or [employer]. Electronically Signed   By: Suzy Bouchard M.D.   On: 03/03/2020 14:11     ASSESSMENT:  1. 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 -MRI of the breast on 12/10/2019 shows left breast malignancy involving all 4 quadrants and skin of the left breast including 0.5 cm biopsy-proven malignancy within the far posterior outer right breast, abnormal highly suspicious nonmuscle like enhancement within all 4 quadrants of the left breast and diffuse skin thickening. This abnormal non-mass-like enhancement and skin thickening extends across the midline to the medial right breast. At least 7 abnormal left axillary lymph nodes and 1 abnormal right axillary lymph node compatible with metastatic disease. -CT CAP on 12/12/2019 shows bulky lymphadenopathy in the left subpectoral and axillary regions with largest lymph node measuring 3.3 cm. Mild mediastinal adenopathy in the right paratracheal prevascular and lateral aortic regions, largest lymph node in the lateral aortic region measuring 2.4 cm. Lymphadenopathy in the inferior jugular and supraclavicular regions, left side greater than right. Mild lymphadenopathy in the right axilla. -Multiple small hypervascular lesions seen in the right and left lobesconsistent with liver metastasis. -Bone scan on 12/12/2019 did not show any bone metastasis. -CT CAP on 12/29/2019 showed possible lytic lesion in the T4 vertebral body. -Liver biopsy on 12/28/2019 consistent with metastatic breast cancer. -Paclitaxel weekly 3 weeks on 1 week off started on 12/31/2019.  2. Right colonic mass: -CT scan showed constricting mass involving the ascending colon measuring 3.6 x 3.5 cm with a small  1 cm right pericolonic lymph node. -She never had a colonoscopy. Denies any bleeding per  rectum or melena.  3.  Left leg DVT: -Doppler on 03/03/2020 shows left femoral vein, popliteal vein and posterior tibial vein deep vein thrombosis.   PLAN:  1. Metastatic left breast cancerto the liver: -She received day 8 of cycle 3 today. -She will come back with a CT scan in 2 weeks.  2. Right colonic mass: -Follow-up with GI regarding colonoscopy.  3. Left breast and left upper extremity pain: -Pain has overall improved since chemotherapy was started.  She is not requiring Dilaudid 2 mg on regular basis.  4.  Left leg DVT: -She complained of left leg pain last Thursday.  Denies any breathing difficulty. -Doppler was done in the office which showed deep vein thrombosis within the left femoral vein, popliteal and posterior tibial vein. -We have given Lovenox.  I have started her on Eliquis.  She will start Eliquis in the morning.   Orders placed this encounter:  No orders of the defined types were placed in this encounter.    Crystal Jack, Vega [place] [phone]   I, Milinda Antis, am acting as a scribe for Dr. Sanda Linger.  I, Crystal Jack Vega, have reviewed the above documentation for accuracy and completeness, and I agree with the above.

## 2020-03-04 ENCOUNTER — Encounter (HOSPITAL_COMMUNITY): Payer: Self-pay

## 2020-03-04 ENCOUNTER — Other Ambulatory Visit (HOSPITAL_COMMUNITY): Payer: Self-pay | Admitting: *Deleted

## 2020-03-04 ENCOUNTER — Inpatient Hospital Stay (HOSPITAL_COMMUNITY): Payer: Medicare HMO

## 2020-03-04 ENCOUNTER — Telehealth (HOSPITAL_COMMUNITY): Payer: Self-pay | Admitting: Surgery

## 2020-03-04 VITALS — BP 115/73 | HR 95 | Temp 96.9°F | Resp 18

## 2020-03-04 DIAGNOSIS — Z5111 Encounter for antineoplastic chemotherapy: Secondary | ICD-10-CM | POA: Diagnosis not present

## 2020-03-04 DIAGNOSIS — I824Y2 Acute embolism and thrombosis of unspecified deep veins of left proximal lower extremity: Secondary | ICD-10-CM

## 2020-03-04 MED ORDER — RIVAROXABAN (XARELTO) VTE STARTER PACK (15 & 20 MG): ORAL_TABLET | ORAL | 0 refills | Status: DC

## 2020-03-04 MED ORDER — OCTREOTIDE ACETATE 30 MG IM KIT
PACK | INTRAMUSCULAR | Status: AC
  Filled 2020-03-04: qty 1

## 2020-03-04 MED ORDER — ENOXAPARIN SODIUM 120 MG/0.8ML ~~LOC~~ SOLN
120.0000 mg | Freq: Once | SUBCUTANEOUS | Status: AC
  Administered 2020-03-04: 120 mg via SUBCUTANEOUS
  Filled 2020-03-04: qty 0.8

## 2020-03-04 MED ORDER — APIXABAN 5 MG PO TABS: 5.0000 mg | ORAL_TABLET | Freq: Two times a day (BID) | ORAL | 0 refills | Status: DC

## 2020-03-04 NOTE — Patient Instructions (Signed)
[place] at [facility] Discharge Instructions  Received Lovenox injection today. Follow-up as scheduled   Thank you for choosing [place] at [facility] to provide your oncology and hematology care.  To afford each patient quality time with our provider, please arrive at least 15 minutes before your scheduled appointment time.   If you have a lab appointment with the [facility] please come in thru the Main Entrance and check in at the main information desk.  You need to re-schedule your appointment should you arrive 10 or more minutes late.  We strive to give you quality time with our providers, and arriving late affects you and other patients whose appointments are after yours.  Also, if you no show three or more times for appointments you may be dismissed from the clinic at the providers discretion.     Again, thank you for choosing [facility].  Our hope is that these requests will decrease the amount of time that you wait before being seen by our physicians.       _____________________________________________________________  Should you have questions after your visit to [facility], please contact our office at [phone] and follow the prompts.  Our office hours are 8:00 a.m. and 4:30 p.m. Monday - Friday.  Please note that voicemails left after 4:00 p.m. may not be returned until the following business day.  We are closed weekends and major holidays.  You do have access to a nurse 24-7, just call the main number to the clinic [phone] and do not press any options, hold on the line and a nurse will answer the phone.    For prescription refill requests, have your pharmacy contact our office and allow 72 hours.    Due to Covid, you will need to wear a mask upon entering the hospital. If you do not have a mask, a mask will be given to you at the Main Entrance upon arrival. For doctor visits, patients may have  1 support person age 35 or older with them. For treatment visits, patients can not have anyone with them due to social distancing guidelines and our immunocompromised population.

## 2020-03-04 NOTE — Telephone Encounter (Signed)
Per Dr. Delton Coombes, patient to come today for lovenox injection.  She also will need xarelto starter pack.  Patient will be updated by primary RN.

## 2020-03-04 NOTE — Telephone Encounter (Signed)
The pt called and stated that she cannot afford the eliquis, since it was still going to be $800 with her insurance.    Dr. Delton Coombes was notified, and the pt was put on the schedule today for another lovenox injection, and a prescription for a starter pack of xarelto was sent to the pharmacy to see what the cost would be.  I called the pt back and she verbalized understanding of the the injection, and that our office was trying to find her a cheaper alternative.

## 2020-03-04 NOTE — Progress Notes (Signed)
Crystal Vega tolerated Lovenox injection well without complaints or incident. VSS Pt given coupon for 30 day free trial of Eliquis with instructions which she is to start tomorrow. Pt verbalized understanding. Pt discharged via wheelchair in satisfactory condition accompanied by her husband

## 2020-03-05 ENCOUNTER — Other Ambulatory Visit (HOSPITAL_COMMUNITY): Payer: Self-pay

## 2020-03-05 MED ORDER — APIXABAN 5 MG PO TABS
5.0000 mg | ORAL_TABLET | Freq: Two times a day (BID) | ORAL | 0 refills | Status: DC
Start: 2020-03-05 — End: 2020-04-02

## 2020-03-10 ENCOUNTER — Inpatient Hospital Stay (HOSPITAL_COMMUNITY): Payer: Medicare HMO

## 2020-03-10 ENCOUNTER — Other Ambulatory Visit: Payer: Self-pay

## 2020-03-10 ENCOUNTER — Encounter (HOSPITAL_COMMUNITY): Payer: Self-pay

## 2020-03-10 VITALS — BP 125/67 | HR 94 | Temp 96.8°F | Resp 17

## 2020-03-10 DIAGNOSIS — Z170 Estrogen receptor positive status [ER+]: Secondary | ICD-10-CM

## 2020-03-10 DIAGNOSIS — Z5111 Encounter for antineoplastic chemotherapy: Secondary | ICD-10-CM | POA: Diagnosis not present

## 2020-03-10 DIAGNOSIS — C50812 Malignant neoplasm of overlapping sites of left female breast: Secondary | ICD-10-CM

## 2020-03-10 DIAGNOSIS — Z95828 Presence of other vascular implants and grafts: Secondary | ICD-10-CM

## 2020-03-10 LAB — CBC WITH DIFFERENTIAL/PLATELET
Abs Immature Granulocytes: 0.06 10*3/uL (ref 0.00–0.07)
Basophils Absolute: 0.1 10*3/uL (ref 0.0–0.1)
Basophils Relative: 2 %
Eosinophils Absolute: 0.1 10*3/uL (ref 0.0–0.5)
Eosinophils Relative: 2 %
HCT: 29.1 % — ABNORMAL LOW (ref 36.0–46.0)
Hemoglobin: 9.4 g/dL — ABNORMAL LOW (ref 12.0–15.0)
Immature Granulocytes: 1 %
Lymphocytes Relative: 39 %
Lymphs Abs: 1.7 10*3/uL (ref 0.7–4.0)
MCH: 28.4 pg (ref 26.0–34.0)
MCHC: 32.3 g/dL (ref 30.0–36.0)
MCV: 87.9 fL (ref 80.0–100.0)
Monocytes Absolute: 0.3 10*3/uL (ref 0.1–1.0)
Monocytes Relative: 8 %
Neutro Abs: 2.1 10*3/uL (ref 1.7–7.7)
Neutrophils Relative %: 48 %
Platelets: 297 10*3/uL (ref 150–400)
RBC: 3.31 MIL/uL — ABNORMAL LOW (ref 3.87–5.11)
RDW: 16.7 % — ABNORMAL HIGH (ref 11.5–15.5)
WBC: 4.4 10*3/uL (ref 4.0–10.5)
nRBC: 0 % (ref 0.0–0.2)

## 2020-03-10 LAB — COMPREHENSIVE METABOLIC PANEL
ALT: 24 U/L (ref 0–44)
AST: 25 U/L (ref 15–41)
Albumin: 3.1 g/dL — ABNORMAL LOW (ref 3.5–5.0)
Alkaline Phosphatase: 66 U/L (ref 38–126)
Anion gap: 9 (ref 5–15)
BUN: 22 mg/dL (ref 8–23)
CO2: 26 mmol/L (ref 22–32)
Calcium: 8.9 mg/dL (ref 8.9–10.3)
Chloride: 99 mmol/L (ref 98–111)
Creatinine, Ser: 0.72 mg/dL (ref 0.44–1.00)
GFR, Estimated: >60 mL/min (ref >60)
Glucose, Bld: 155 mg/dL — ABNORMAL HIGH (ref 70–99)
Potassium: 3.5 mmol/L (ref 3.5–5.1)
Sodium: 134 mmol/L — ABNORMAL LOW (ref 135–145)
Total Bilirubin: 0.6 mg/dL (ref 0.3–1.2)
Total Protein: 7 g/dL (ref 6.5–8.1)

## 2020-03-10 MED ORDER — DIPHENHYDRAMINE HCL 50 MG/ML IJ SOLN
50.0000 mg | Freq: Once | INTRAMUSCULAR | Status: AC
  Administered 2020-03-10: 50 mg via INTRAVENOUS
  Filled 2020-03-10: qty 1

## 2020-03-10 MED ORDER — SODIUM CHLORIDE 0.9% FLUSH
10.0000 mL | INTRAVENOUS | Status: DC | PRN
  Administered 2020-03-10 (×2): 10 mL

## 2020-03-10 MED ORDER — HEPARIN SOD (PORK) LOCK FLUSH 100 UNIT/ML IV SOLN
500.0000 [IU] | Freq: Once | INTRAVENOUS | Status: AC | PRN
  Administered 2020-03-10: 500 [IU]

## 2020-03-10 MED ORDER — SODIUM CHLORIDE 0.9 % IV SOLN: Freq: Once | INTRAVENOUS | Status: AC

## 2020-03-10 MED ORDER — FAMOTIDINE IN NACL 20-0.9 MG/50ML-% IV SOLN
20.0000 mg | Freq: Once | INTRAVENOUS | Status: AC
  Administered 2020-03-10: 20 mg via INTRAVENOUS
  Filled 2020-03-10: qty 50

## 2020-03-10 MED ORDER — SODIUM CHLORIDE 0.9 % IV SOLN
Freq: Once | INTRAVENOUS | Status: AC
  Administered 2020-03-10: 8 mg via INTRAVENOUS
  Filled 2020-03-10: qty 4

## 2020-03-10 MED ORDER — SODIUM CHLORIDE 0.9 % IV SOLN
10.0000 mg | Freq: Once | INTRAVENOUS | Status: AC
  Administered 2020-03-10: 10 mg via INTRAVENOUS
  Filled 2020-03-10: qty 10

## 2020-03-10 MED ORDER — OCTREOTIDE ACETATE 30 MG IM KIT
PACK | INTRAMUSCULAR | Status: AC
  Filled 2020-03-10: qty 1

## 2020-03-10 MED ORDER — SODIUM CHLORIDE 0.9 % IV SOLN
80.0000 mg/m2 | Freq: Once | INTRAVENOUS | Status: AC
  Administered 2020-03-10: 192 mg via INTRAVENOUS
  Filled 2020-03-10: qty 32

## 2020-03-10 NOTE — Patient Instructions (Signed)
[facility] at [facility] Discharge Instructions  Labs drawn from portacath today   Thank you for choosing [facility] at [facility] to provide your oncology and hematology care.  To afford each patient quality time with our provider, please arrive at least 15 minutes before your scheduled appointment time.   If you have a lab appointment with the Cancer Center please come in thru the Main Entrance and check in at the main information desk.  You need to re-schedule your appointment should you arrive 10 or more minutes late.  We strive to give you quality time with our providers, and arriving late affects you and other patients whose appointments are after yours.  Also, if you no show three or more times for appointments you may be dismissed from the clinic at the providers discretion.     Again, thank you for choosing [facility].  Our hope is that these requests will decrease the amount of time that you wait before being seen by our physicians.       _____________________________________________________________  Should you have questions after your visit to  Cancer Center, please contact our office at [phone] and follow the prompts.  Our office hours are 8:00 a.m. and 4:30 p.m. Monday - Friday.  Please note that voicemails left after 4:00 p.m. may not be returned until the following business day.  We are closed weekends and major holidays.  You do have access to a nurse 24-7, just call the main number to the clinic [phone] and do not press any options, hold on the line and a nurse will answer the phone.    For prescription refill requests, have your pharmacy contact our office and allow 72 hours.    Due to Covid, you will need to wear a mask upon entering the hospital. If you do not have a mask, a mask will be given to you at the Main Entrance upon arrival. For doctor visits, patients may have 1 support person age 18  or older with them. For treatment visits, patients can not have anyone with them due to social distancing guidelines and our immunocompromised population.     

## 2020-03-10 NOTE — Progress Notes (Signed)
Pt here today for here D15C3 taxol.  Complaints of fatigue, still taking eliquis as prescribed for her blood clots.    Blood work is WNL for treatment, okay to treat.    Tolerated treatment well today without incidence. Vital signs stable prior to discharge.  Discharged in stable condition via wheelchair.

## 2020-03-10 NOTE — Patient Instructions (Signed)
[facility] Discharge Instructions for Patients Receiving Chemotherapy  Today you received the following chemotherapy agents   To help prevent nausea and vomiting after your treatment, we encourage you to take your nausea medication   If you develop nausea and vomiting that is not controlled by your nausea medication, call the clinic.   BELOW ARE SYMPTOMS THAT SHOULD BE REPORTED IMMEDIATELY:  *FEVER GREATER THAN 100.5 F  *CHILLS WITH OR WITHOUT FEVER  NAUSEA AND VOMITING THAT IS NOT CONTROLLED WITH YOUR NAUSEA MEDICATION  *UNUSUAL SHORTNESS OF BREATH  *UNUSUAL BRUISING OR BLEEDING  TENDERNESS IN MOUTH AND THROAT WITH OR WITHOUT PRESENCE OF ULCERS  *URINARY PROBLEMS  *BOWEL PROBLEMS  UNUSUAL RASH Items with * indicate a potential emergency and should be followed up as soon as possible.  Feel free to call the clinic should you have any questions or concerns. The clinic phone number is [phone].  Please show the CHEMO ALERT CARD at check-in to the Emergency Department and triage nurse.   

## 2020-03-11 MED ORDER — ACETAMINOPHEN 325 MG PO TABS
ORAL_TABLET | ORAL | Status: AC
  Filled 2020-03-11: qty 2

## 2020-03-11 MED ORDER — DIPHENHYDRAMINE HCL 25 MG PO CAPS
ORAL_CAPSULE | ORAL | Status: AC
  Filled 2020-03-11: qty 1

## 2020-03-12 MED ORDER — LANREOTIDE ACETATE 120 MG/0.5ML ~~LOC~~ SOLN
SUBCUTANEOUS | Status: AC
  Filled 2020-03-12: qty 120

## 2020-03-20 ENCOUNTER — Ambulatory Visit (HOSPITAL_COMMUNITY)
Admission: RE | Admit: 2020-03-20 | Discharge: 2020-03-20 | Disposition: A | Discharge status: Home / Self Care | Payer: Medicare HMO | Source: Ambulatory Visit | Attending: Hematology | Admitting: Hematology

## 2020-03-20 ENCOUNTER — Encounter (HOSPITAL_COMMUNITY): Payer: Self-pay | Admitting: Radiology

## 2020-03-20 ENCOUNTER — Other Ambulatory Visit: Payer: Self-pay

## 2020-03-20 DIAGNOSIS — Z170 Estrogen receptor positive status [ER+]: Secondary | ICD-10-CM | POA: Diagnosis present

## 2020-03-20 DIAGNOSIS — C50812 Malignant neoplasm of overlapping sites of left female breast: Secondary | ICD-10-CM | POA: Diagnosis present

## 2020-03-20 MED ORDER — IOHEXOL 300 MG/ML  SOLN
100.0000 mL | Freq: Once | INTRAMUSCULAR | Status: AC | PRN
  Administered 2020-03-20: 100 mL via INTRAVENOUS

## 2020-03-24 ENCOUNTER — Encounter (HOSPITAL_COMMUNITY): Payer: Self-pay | Admitting: Hematology

## 2020-03-24 ENCOUNTER — Inpatient Hospital Stay (HOSPITAL_COMMUNITY): Payer: Medicare HMO

## 2020-03-24 ENCOUNTER — Inpatient Hospital Stay (HOSPITAL_COMMUNITY): Payer: Medicare HMO | Admitting: Hematology

## 2020-03-24 ENCOUNTER — Other Ambulatory Visit: Payer: Self-pay

## 2020-03-24 VITALS — BP 115/63 | HR 90 | Temp 96.8°F | Resp 17

## 2020-03-24 VITALS — BP 111/77 | HR 110 | Temp 96.8°F | Resp 20 | Wt 289.6 lb

## 2020-03-24 DIAGNOSIS — Z170 Estrogen receptor positive status [ER+]: Secondary | ICD-10-CM

## 2020-03-24 DIAGNOSIS — I82412 Acute embolism and thrombosis of left femoral vein: Secondary | ICD-10-CM

## 2020-03-24 DIAGNOSIS — C50812 Malignant neoplasm of overlapping sites of left female breast: Secondary | ICD-10-CM

## 2020-03-24 DIAGNOSIS — Z5111 Encounter for antineoplastic chemotherapy: Secondary | ICD-10-CM | POA: Diagnosis not present

## 2020-03-24 DIAGNOSIS — Z95828 Presence of other vascular implants and grafts: Secondary | ICD-10-CM

## 2020-03-24 LAB — CBC WITH DIFFERENTIAL/PLATELET
Abs Immature Granulocytes: 0.21 10*3/uL — ABNORMAL HIGH (ref 0.00–0.07)
Basophils Absolute: 0.1 10*3/uL (ref 0.0–0.1)
Basophils Relative: 1 %
Eosinophils Absolute: 0.1 10*3/uL (ref 0.0–0.5)
Eosinophils Relative: 2 %
HCT: 30.6 % — ABNORMAL LOW (ref 36.0–46.0)
Hemoglobin: 9.9 g/dL — ABNORMAL LOW (ref 12.0–15.0)
Immature Granulocytes: 3 %
Lymphocytes Relative: 24 %
Lymphs Abs: 1.8 10*3/uL (ref 0.7–4.0)
MCH: 28.6 pg (ref 26.0–34.0)
MCHC: 32.4 g/dL (ref 30.0–36.0)
MCV: 88.4 fL (ref 80.0–100.0)
Monocytes Absolute: 1 10*3/uL (ref 0.1–1.0)
Monocytes Relative: 14 %
Neutro Abs: 4.3 10*3/uL (ref 1.7–7.7)
Neutrophils Relative %: 56 %
Platelets: 306 10*3/uL (ref 150–400)
RBC: 3.46 MIL/uL — ABNORMAL LOW (ref 3.87–5.11)
RDW: 17.9 % — ABNORMAL HIGH (ref 11.5–15.5)
WBC: 7.5 10*3/uL (ref 4.0–10.5)
nRBC: 0.4 % — ABNORMAL HIGH (ref 0.0–0.2)

## 2020-03-24 LAB — COMPREHENSIVE METABOLIC PANEL
ALT: 20 U/L (ref 0–44)
AST: 21 U/L (ref 15–41)
Albumin: 3.3 g/dL — ABNORMAL LOW (ref 3.5–5.0)
Alkaline Phosphatase: 73 U/L (ref 38–126)
Anion gap: 10 (ref 5–15)
BUN: 20 mg/dL (ref 8–23)
CO2: 26 mmol/L (ref 22–32)
Calcium: 8.7 mg/dL — ABNORMAL LOW (ref 8.9–10.3)
Chloride: 99 mmol/L (ref 98–111)
Creatinine, Ser: 0.69 mg/dL (ref 0.44–1.00)
GFR, Estimated: >60 mL/min (ref >60)
Glucose, Bld: 129 mg/dL — ABNORMAL HIGH (ref 70–99)
Potassium: 3.4 mmol/L — ABNORMAL LOW (ref 3.5–5.1)
Sodium: 135 mmol/L (ref 135–145)
Total Bilirubin: 0.4 mg/dL (ref 0.3–1.2)
Total Protein: 7 g/dL (ref 6.5–8.1)

## 2020-03-24 MED ORDER — SODIUM CHLORIDE 0.9 % IV SOLN: Freq: Once | INTRAVENOUS | Status: AC

## 2020-03-24 MED ORDER — DIPHENHYDRAMINE HCL 50 MG/ML IJ SOLN
50.0000 mg | Freq: Once | INTRAMUSCULAR | Status: AC
  Administered 2020-03-24: 50 mg via INTRAVENOUS
  Filled 2020-03-24: qty 1

## 2020-03-24 MED ORDER — HEPARIN SOD (PORK) LOCK FLUSH 100 UNIT/ML IV SOLN
500.0000 [IU] | Freq: Once | INTRAVENOUS | Status: AC | PRN
  Administered 2020-03-24: 500 [IU]

## 2020-03-24 MED ORDER — FAMOTIDINE IN NACL 20-0.9 MG/50ML-% IV SOLN
20.0000 mg | Freq: Once | INTRAVENOUS | Status: AC
  Administered 2020-03-24: 20 mg via INTRAVENOUS
  Filled 2020-03-24: qty 50

## 2020-03-24 MED ORDER — SODIUM CHLORIDE 0.9 % IV SOLN
10.0000 mg | Freq: Once | INTRAVENOUS | Status: AC
  Administered 2020-03-24: 10 mg via INTRAVENOUS
  Filled 2020-03-24: qty 1

## 2020-03-24 MED ORDER — LANREOTIDE ACETATE 120 MG/0.5ML ~~LOC~~ SOLN
SUBCUTANEOUS | Status: AC
  Filled 2020-03-24: qty 120

## 2020-03-24 MED ORDER — SODIUM CHLORIDE 0.9 % IV SOLN
Freq: Once | INTRAVENOUS | Status: AC
  Administered 2020-03-24: 8 mg via INTRAVENOUS
  Filled 2020-03-24: qty 4

## 2020-03-24 MED ORDER — OCTREOTIDE ACETATE 30 MG IM KIT
PACK | INTRAMUSCULAR | Status: AC
  Filled 2020-03-24: qty 1

## 2020-03-24 MED ORDER — SODIUM CHLORIDE 0.9 % IV SOLN
80.0000 mg/m2 | Freq: Once | INTRAVENOUS | Status: AC
  Administered 2020-03-24: 192 mg via INTRAVENOUS
  Filled 2020-03-24: qty 32

## 2020-03-24 MED ORDER — SODIUM CHLORIDE 0.9% FLUSH
10.0000 mL | INTRAVENOUS | Status: DC | PRN
  Administered 2020-03-24: 10 mL

## 2020-03-24 MED ORDER — DENOSUMAB 120 MG/1.7ML ~~LOC~~ SOLN
SUBCUTANEOUS | Status: AC
  Filled 2020-03-24: qty 1.7

## 2020-03-24 NOTE — Patient Instructions (Signed)
Crystal Vega Discharge Instructions for Patients Receiving Chemotherapy  Today you received the following chemotherapy agents   To help prevent nausea and vomiting after your treatment, we encourage you to take your nausea medication   If you develop nausea and vomiting that is not controlled by your nausea medication, call the clinic.   BELOW ARE SYMPTOMS THAT SHOULD BE REPORTED IMMEDIATELY:  *FEVER GREATER THAN 100.5 F  *CHILLS WITH OR WITHOUT FEVER  NAUSEA AND VOMITING THAT IS NOT CONTROLLED WITH YOUR NAUSEA MEDICATION  *UNUSUAL SHORTNESS OF BREATH  *UNUSUAL BRUISING OR BLEEDING  TENDERNESS IN MOUTH AND THROAT WITH OR WITHOUT PRESENCE OF ULCERS  *URINARY PROBLEMS  *BOWEL PROBLEMS  UNUSUAL RASH Items with * indicate a potential emergency and should be followed up as soon as possible.  Feel free to call the clinic should you have any questions or concerns. The clinic phone number is [phone].  Please show the CHEMO ALERT CARD at check-in to the Emergency Department and triage nurse.   

## 2020-03-24 NOTE — Progress Notes (Signed)
Patient presents today for treatment and follow up visit with Dr. Delton Coombes. Labs reviewed and within parameters for treatment. MAR reviewed and updated. Patient has complaints of left axillary pain which is ongoing and she rates a 3/10. Patient states it has improved. Patient has complaints of soreness in her fingertips.   Message received from [place] RN/ Dr. Delton Coombes to proceed with treatment today.   Treatment given today per MD orders. Tolerated infusion without adverse affects. Vital signs stable. No complaints at this time. Discharged from clinic via wheel chair in stable condition. Alert and oriented x 3. F/U with [facility] as scheduled.

## 2020-03-24 NOTE — Patient Instructions (Signed)
[place] at [facility] Discharge Instructions  You were seen today by Dr. Delton Coombes. He went over your recent results and scans. You received your treatment today; continue receiving your weekly treatment. Cut your fingernails and see if the tenderness in your fingertips improves. Dr. Delton Coombes will see you back in 4 weeks for labs and follow up.   Thank you for choosing [place] at [facility] to provide your oncology and hematology care.  To afford each patient quality time with our provider, please arrive at least 15 minutes before your scheduled appointment time.   If you have a lab appointment with the [facility] please come in thru the Main Entrance and check in at the main information desk  You need to re-schedule your appointment should you arrive 10 or more minutes late.  We strive to give you quality time with our providers, and arriving late affects you and other patients whose appointments are after yours.  Also, if you no show three or more times for appointments you may be dismissed from the clinic at the providers discretion.     Again, thank you for choosing [facility].  Our hope is that these requests will decrease the amount of time that you wait before being seen by our physicians.       _____________________________________________________________  Should you have questions after your visit to [facility], please contact our office at [phone] between the hours of 8:00 a.m. and 4:30 p.m.  Voicemails left after 4:00 p.m. will not be returned until the following business day.  For prescription refill requests, have your pharmacy contact our office and allow 72 hours.    Cancer Center Support Programs:   > Cancer Support Group  2nd Tuesday of the month 1pm-2pm, Journey Room

## 2020-03-24 NOTE — Progress Notes (Signed)
[name], [place] [address]   CLINIC:  Medical Oncology/Hematology  PCP:  Lemmie Evens, MD [address] / [place] [address] [phone]   REASON FOR VISIT:  Follow-up for left breast cancer  PRIOR THERAPY: None  NGS Results: Not done  CURRENT THERAPY: Paclitaxel 3 weeks on, 1 week off  BRIEF ONCOLOGIC HISTORY:  Oncology History  Malignant neoplasm of overlapping sites of left breast in female, estrogen receptor positive ([place])  11/16/2019 Cancer Staging   Staging form: Breast, AJCC 8th Edition - Clinical stage from 11/16/2019: Stage IV (cT4b, cN1, cM1, G3, ER+, PR-, HER2-) - Signed by [name], MD on 12/17/2019   11/28/2019 Initial Diagnosis   Patient noted intermittent left breast pain and swelling for several months. Mammogram showed a 6.2cm mass in the left breast axillary tail with an adjacent 0.9cm mass, 2 abnormal left axillary lymph nodes, 1 abnormal right axillary lymph node, and diffuse left breast edema and skin thickening. Biopsy showed IDC, grade 3 in the left breast and bilateral axillas, HER-2 negative (1+), ER+ 100%, PR- 0%, Ki67 30%.    12/31/2019 -  Chemotherapy   The patient had ondansetron (ZOFRAN) 8 mg in sodium chloride 0.9 % 50 mL IVPB, , Intravenous,  Once, 3 of 4 cycles Administration: 8 mg (12/31/2019), 8 mg (01/07/2020), 8 mg (01/28/2020), 8 mg (02/05/2020), 8 mg (01/14/2020), 8 mg (02/11/2020), 8 mg (02/25/2020), 8 mg (03/03/2020), 8 mg (03/10/2020) PACLitaxel (TAXOL) 192 mg in sodium chloride 0.9 % 250 mL chemo infusion (</= 42m/m2), 80 mg/m2 = 192 mg, Intravenous,  Once, 3 of 4 cycles Administration: 192 mg (12/31/2019), 192 mg (01/07/2020), 192 mg (01/28/2020), 192 mg (02/05/2020), 192 mg (01/14/2020), 192 mg (02/11/2020), 192 mg (02/25/2020), 192 mg (03/03/2020), 192 mg (03/10/2020)  for chemotherapy treatment.    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 a 69y.o. female, returns for routine follow-up and consideration for next cycle of chemotherapy. AJ[place] last seen on 03/03/2020.  Due for cycle #4 of paclitaxel today.   Overall, she tells me she has been feeling pretty well. She is tolerating the treatment well and feels very good for 2-3 days and denies N/V/D. She is taking the Eliquis daily and tolerating it well. She denies having pain but reports having tightness and numbness in her left arm; she has not taken any Dilaudid or Norco recently. Her fingertips are sore. Her appetite is excellent.  Overall, she feels ready for next cycle of chemo today.    REVIEW OF SYSTEMS:  Review of Systems  Constitutional: Positive for fatigue (50%).  Negative for appetite change.  Respiratory: Positive for shortness of breath (w/ exertion).   Gastrointestinal: Negative for diarrhea, nausea and vomiting.  Musculoskeletal:       L arm tightness  Neurological: Positive for numbness (L arm).  Psychiatric/Behavioral: Positive for sleep disturbance.  All other systems reviewed and are negative.   PAST MEDICAL/SURGICAL  HISTORY:  Past Medical History:  Diagnosis Date  . Anemia   . Arthritis    per patient " left knee"  . Essential hypertension, benign   . Family history of cancer of female genital organ   . Family history of GI tract cancer   . Metastatic breast cancer ([place])    left breast  . Port-A-Cath in place 12/25/2019  . Type 2 diabetes mellitus ([place])    Past Surgical History:  Procedure Laterality Date  . CATARACT EXTRACTION W/ INTRAOCULAR LENS IMPLANT Right   . HYSTEROSCOPY WITH D & C N/A 06/05/2014   Procedure: DILATATION AND CURETTAGE /HYSTEROSCOPY;  Surgeon: Florian Buff, MD;  Location: [facility];  Service: Gynecology;  Laterality: N/A;  . POLYPECTOMY N/A 06/05/2014   Procedure: ENDOMETRIAL POLYPECTOMY;  Surgeon: Florian Buff, MD;  Location: [facility];  Service: Gynecology;  Laterality: N/A;  . PORTACATH PLACEMENT N/A 12/14/2019   Procedure: INSERTION PORT-A-CATH WITH ULTRASOUND GUIDANCE;  Surgeon: Donnie Mesa, MD;  Location: [facility];  Service: General;  Laterality: N/A;  . TUBAL LIGATION      SOCIAL HISTORY:  Social History   Socioeconomic History  . Marital status: Married    Spouse name: Herbie Baltimore  . Number of children: 2  . Years of education: Not on file  . Highest education level: Not on file  Occupational History  . Occupation: retired  Tobacco Use  . Smoking status: Former Smoker    Types: Cigarettes  . Smokeless tobacco: Never Used  . Tobacco comment: quit about 40+ years ago  Vaping Use  . Vaping Use: Never used  Substance and Sexual Activity  . Alcohol use: Never  . Drug use: No  . Sexual activity: Not on file  Other Topics Concern  . Not on file  Social History Narrative  . Not on file   Social Determinants of Health   Financial Resource Strain: Low Risk   . Difficulty of Paying Living Expenses: Not hard at all  Food Insecurity: No Food Insecurity  . Worried About Charity fundraiser in the Last Year: Never true  . Ran Out of Food in the Last Year: Never true    Transportation Needs: No Transportation Needs  . Lack of Transportation (Medical): No  . Lack of Transportation (Non-Medical): No  Physical Activity: Inactive  . Days of Exercise per Week: 0 days  . Minutes of Exercise per Session: 0 min  Stress: No Stress Concern Present  . Feeling of Stress : Only a little  Social Connections: Moderately Integrated  . Frequency of Communication with Friends and Family: Three times a week  . Frequency of Social Gatherings with Friends and Family: Three times a week  . Attends Religious Services: 1 to 4 times per year  . Active Member of Clubs or Organizations: No  . Attends Archivist Meetings: Never  . Marital Status: Married  Human resources officer Violence: Not At Risk  . Fear of Current or Ex-Partner: No  . Emotionally Abused: No  . Physically Abused: No  . Sexually Abused: No    FAMILY HISTORY:  Family History  Problem Relation Age of Onset  .  Diabetes Mellitus II Father   . Congestive Heart Failure Father   . Cancer Mother        Deceased at age 43; patient not sure if cancer was in her stomach or colon  . Stroke Maternal Grandmother   . Cancer Cousin        female reproductive cancer, dx. 50s/60s    CURRENT MEDICATIONS:  Current Outpatient Medications  Medication Sig Dispense Refill  . apixaban (ELIQUIS) 5 MG TABS tablet Take 1 tablet (5 mg total) by mouth 2 (two) times daily. 60 tablet 0  . ferrous sulfate 325 (65 FE) MG tablet Take 325 mg by mouth daily with breakfast.    . hydrochlorothiazide (HYDRODIURIL) 25 MG tablet Take 25 mg by mouth daily.    Marland Kitchen HYDROcodone-acetaminophen (NORCO/VICODIN) 5-325 MG tablet Take 1 tablet by mouth every 6 (six) hours as needed for moderate pain. 15 tablet 0  . HYDROmorphone (DILAUDID) 2 MG tablet Take 1 tablet (2 mg total) by mouth every 4 (four) hours as needed for severe pain. 30 tablet 0  . ibuprofen (ADVIL,MOTRIN) 800 MG tablet Take 800 mg by mouth every 8 (eight) hours as needed for  moderate pain.     Marland Kitchen lidocaine-prilocaine (EMLA) cream Apply a small amount to port a cath site and cover with plastic wrap 1 hour prior to chemotherapy appointments 30 g 3  . losartan (COZAAR) 50 MG tablet Take 50 mg by mouth daily.    . metFORMIN (GLUCOPHAGE) 500 MG tablet Take 500 mg by mouth daily with breakfast.     . oxybutynin (DITROPAN) 5 MG tablet Take 5 mg by mouth daily.     Marland Kitchen PACLitaxel (TAXOL IV) Inject 80 mg/m2 into the vein once a week. Day 1, 8, 15 every 28 days    . potassium chloride (MICRO-K) 10 MEQ CR capsule Take 10 mEq by mouth daily.     . pravastatin (PRAVACHOL) 10 MG tablet Take 10 mg by mouth daily.     . prochlorperazine (COMPAZINE) 10 MG tablet Take 1 tablet (10 mg total) by mouth every 6 (six) hours as needed (Nausea or vomiting). 30 tablet 1  . RIVAROXABAN (XARELTO) VTE STARTER PACK (15 & 20 MG TABLETS) Follow package directions: Take one 35m tablet by mouth twice a day. On day 22, switch to one 272mtablet once a day. Take with food. 51 each 0   No current facility-administered medications for this visit.    ALLERGIES:  No Known Allergies  PHYSICAL EXAM:  Performance status (ECOG): 1 - Symptomatic but completely ambulatory  Vitals:   03/24/20 0853  BP: 111/77  Pulse: (!) 110  Resp: 20  Temp: (!) 96.8 F (36 C)  SpO2: 97%   Wt Readings from Last 3 Encounters:  03/24/20 289 lb 9.6 oz (131.4 kg)  03/10/20 285 lb 6.4 oz (129.5 kg)  03/03/20 278 lb 12.8 oz (126.5 kg)   Physical Exam Vitals reviewed.  Constitutional:      Appearance: Normal appearance. She is obese.  Cardiovascular:     Rate and Rhythm: Normal rate and regular rhythm.     Pulses: Normal pulses.     Heart sounds: Normal heart sounds.  Pulmonary:     Effort: Pulmonary effort is normal.     Breath sounds: Normal breath sounds.  Chest:     Comments: Port-a-Cath in R chest Musculoskeletal:     Right lower leg: No edema.     Left lower leg: No edema.  Neurological:  General:  No focal deficit present.     Mental Status: She is alert and oriented to person, place, and time.  Psychiatric:        Mood and Affect: Mood normal.        Behavior: Behavior normal.     LABORATORY DATA:  I have reviewed the labs as listed.  CBC Latest Ref Rng & Units 03/10/2020 03/03/2020 02/25/2020  WBC 4.0 - 10.5 K/uL 4.4 6.5 7.3  Hemoglobin 12.0 - 15.0 g/dL 9.4(L) 10.2(L) 10.7(L)  Hematocrit 36 - 46 % 29.1(L) 30.6(L) 33.1(L)  Platelets 150 - 400 K/uL 297 248 248   CMP Latest Ref Rng & Units 03/10/2020 03/03/2020 02/25/2020  Glucose 70 - 99 mg/dL 155(H) 169(H) 150(H)  BUN 8 - 23 mg/dL _0 Creatinine 0.44 - 1.00 mg/dL 0.72 0.70 0.75  Sodium 135 - 145 mmol/L 134(L) 134(L) 136  Potassium 3.5 - 5.1 mmol/L 3.5 3.3(L) 3.5  Chloride 98 - 111 mmol/L 99 98 99  CO2 22 - 32 mmol/L _1 Calcium 8.9 - 10.3 mg/dL 8.9 8.8(L) 8.9  Total Protein 6.5 - 8.1 g/dL 7.0 7.0 7.1  Total Bilirubin 0.3 - 1.2 mg/dL 0.6 0.6 0.5  Alkaline Phos 38 - 126 U/L 66 71 80  AST 15 - 41 U/L _2 ALT 0 - 44 U/L _3 DIAGNOSTIC IMAGING:  I have independently reviewed the scans and discussed with the patient. CT CHEST ABDOMEN PELVIS W CONTRAST  Result Date: 03/21/2020 CLINICAL DATA:  Follow-up metastatic breast carcinoma. Chemotherapy. Restaging. EXAM: CT CHEST, ABDOMEN, AND PELVIS WITH CONTRAST TECHNIQUE: Multidetector CT imaging of the chest, abdomen and pelvis was performed following the standard protocol during bolus administration of intravenous contrast. CONTRAST:  182m OMNIPAQUE IOHEXOL 300 MG/ML  SOLN COMPARISON:  12/29/2019 FINDINGS: CT CHEST FINDINGS Cardiovascular: No acute findings. Mediastinum/Lymph Nodes: Left axillary, subpectoral supraclavicular, and lower jugular lymphadenopathy has decreased since previous study. Largest conglomerate area in the left axilla measures 5.6 x 5.0 cm on image 17/2, compared to 6.5 x 6.0 cm previously. Largest residual lymph node in left lower jugular  region measures 1.8 cm on image 6/2, compared to 2.3 cm previously. Mild mediastinal lymphadenopathy in the lateral aortic and right paratracheal regions also shows mild decrease since prior study. No new or increased sites of lymphadenopathy identified. Lungs/Pleura: No pulmonary infiltrate or mass identified. No effusion present. Musculoskeletal: Lucent lesion is in the left sternal manubrium and T4 vertebral body remains stable; bone metastases cannot be excluded. CT ABDOMEN AND PELVIS FINDINGS Hepatobiliary: Several small poorly defined low-attenuation liver lesions are again seen which are difficult to measure due to their ill-defined margins. These show no significant change compared to previous study. Gallbladder is unremarkable. No evidence of biliary ductal dilatation. Pancreas:  No mass or inflammatory changes. Spleen:  Within normal limits in size and appearance. Adrenals/Urinary tract: Several small renal cysts are again seen bilaterally. No masses or hydronephrosis. Stomach/Bowel: Persistent short segment colonic wall thickening seen in ascending colon just proximal to the hepatic flexure on image 56/2. A few tiny sub-cm adjacent pericolonic lymph nodes are seen. Primary colon carcinoma cannot be excluded. No evidence of obstruction, inflammatory process, or abnormal fluid collections. Vascular/Lymphatic: No pathologically enlarged lymph nodes identified. No abdominal aortic aneurysm. Reproductive: Several small calcified uterine fibroids remains stable. Adnexal regions are unremarkable. Other:  None. Musculoskeletal: No suspicious bone lesions identified. Old left hip fracture deformity and osteoarthritis again noted. IMPRESSION: No  new or progressive metastatic disease identified the chest, abdomen, or pelvis. Mild decrease in left axillary, subpectoral, supraclavicular, and lower jugular lymphadenopathy. Mild decrease in mediastinal lymphadenopathy. Stable small low-attenuation liver lesions  suspicious for metastatic disease. Stable lucent lesions in left sternal manubrium and T4 vertebral body. Bone metastases cannot be excluded. Stable short segment concentric wall thickening of right colon, with sub-centimeter pericolonic lymph nodes; primary colon carcinoma cannot be excluded. Consider further evaluation with colonoscopy. Stable small calcified uterine fibroids. Electronically Signed   By: Marlaine Hind M.D.   On: 03/21/2020 09:51   US Venous Img Lower Unilateral Left  Result Date: 03/03/2020 CLINICAL DATA:  LEFT lower extremity pain and swelling EXAM: LEFT LOWER EXTREMITY VENOUS DOPPLER ULTRASOUND TECHNIQUE: Gray-scale sonography with graded compression, as well as color Doppler and duplex ultrasound were performed to evaluate the lower extremity deep venous systems from the level of the common femoral vein and including the common femoral, femoral, profunda femoral, popliteal and calf veins including the posterior tibial, peroneal and gastrocnemius veins when visible. The superficial great saphenous vein was also interrogated. Spectral Doppler was utilized to evaluate flow at rest and with distal augmentation maneuvers in the common femoral, femoral and popliteal veins. COMPARISON:  None. FINDINGS: Contralateral Common Femoral Vein: Respiratory phasicity is normal and symmetric with the symptomatic side. No evidence of thrombus. Normal compressibility. Common Femoral Vein: No evidence of thrombus. Normal compressibility, respiratory phasicity and response to augmentation. Saphenofemoral Junction: No evidence of thrombus. Normal compressibility and flow on color Doppler imaging. Profunda Femoral Vein: No evidence of thrombus. Normal compressibility and flow on color Doppler imaging. Femoral Vein: Echogenic thrombus within the LEFT femoral vein. This thrombus is partially occlusive. Vein is non compressible. Popliteal Vein: Occlusive thrombus within the popliteal vein. Noncompressible. Calf  Veins: Occlusive thrombus in the posterior tibial vein. Noncompressible. Superficial Great Saphenous Vein: No evidence of thrombus. Normal compressibility. Venous Reflux:  None Other Findings:  None. IMPRESSION: Deep venous thrombosis within the LEFT femoral vein, popliteal vein and posterior tibial vein. Thrombus is occlusive within the popliteal vein and posterior tibial vein. These results will be called to the ordering clinician or representative by the Radiologist Assistant, and communication documented in the PACS or [employer]. Electronically Signed   By: Suzy Bouchard M.D.   On: 03/03/2020 14:11     ASSESSMENT:  1. 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 -MRI of the breast on 12/10/2019 shows left breast malignancy involving all 4 quadrants and skin of the left breast including 0.5 cm biopsy-proven malignancy within the far posterior outer right breast, abnormal highly suspicious nonmuscle like enhancement within all 4 quadrants of the left breast and diffuse skin thickening. This abnormal non-mass-like enhancement and skin thickening extends across the midline to the medial right breast. At least 7 abnormal left axillary lymph nodes and 1 abnormal right axillary lymph node compatible with metastatic disease. -CT CAP on 12/12/2019 shows bulky lymphadenopathy in the left subpectoral and axillary regions with largest lymph node measuring 3.3 cm. Mild mediastinal adenopathy in the right paratracheal prevascular and lateral aortic regions, largest lymph node in the lateral aortic region measuring 2.4 cm. Lymphadenopathy in the inferior jugular and supraclavicular regions, left side greater than right. Mild lymphadenopathy in the right axilla. -Multiple small hypervascular lesions  seen in the right and left  lobesconsistent with liver metastasis. -Bone scan on 12/12/2019 did not show any bone metastasis. -CT CAP on 12/29/2019 showed possible lytic lesion in the T4 vertebral body. -Liver biopsy on 12/28/2019 consistent with metastatic breast cancer. -Paclitaxel weekly 3 weeks on 1 week off started on 12/31/2019. -CT CAP on 03/20/2020 shows mild decrease in the left axillary, subpectoral, supraclavicular, mediastinal and lower jugular lymphadenopathy.  Stable small low-attenuation liver lesion.  No new progressive metastatic disease.  Stable lesion in the left sternal manubrium and T4 vertebral body.  Stable short segment concentric wall thickening of the right colon with subcentimeter pericolonic lymph nodes.  We will monitor this finding as she has metastatic breast cancer.  She is asymptomatic.  2. Right colonic mass: -CT scan showed constricting mass involving the ascending colon measuring 3.6 x 3.5 cm with a small 1 cm right pericolonic lymph node. -She never had a colonoscopy. Denies any bleeding per rectum or melena.  3.  Left leg DVT: -Doppler on 03/03/2020 shows left femoral vein, popliteal vein and posterior tibial vein deep vein thrombosis.   PLAN:  1. Metastatic left breast cancerto the liver: -We reviewed results of the CT CAP which showed response to treatment. -New chronic finding concerning for primary colon cancer.  However she is asymptomatic.  We will monitor it at this time as she has metastatic breast cancer. -Reviewed labs which showed albumin 3.3 and rest of LFTs are normal.  CBC showed normal white count and platelets. -Proceed with next cycle.  RTC 4 weeks with labs.  2. Right colonic mass: -Currently asymptomatic.  Will consider colonoscopy if she becomes symptomatic.  3. Left breast and left upper extremity pain: -Pain has overall improved since chemotherapy.  She is not requiring Dilaudid on regular basis.  4.  Left leg DVT: -She is  taking Eliquis.  But she is having problems with co-pay. -We will request our financial counselor to look into this.   Orders placed this encounter:  No orders of the defined types were placed in this encounter.    [name], MD [place] [phone]   I, Milinda Antis, am acting as a scribe for Dr. Sanda Linger.  I, [name] MD, have reviewed the above documentation for accuracy and completeness, and I agree with the above.

## 2020-03-25 ENCOUNTER — Telehealth (HOSPITAL_COMMUNITY): Payer: Self-pay | Admitting: Hematology

## 2020-03-25 NOTE — Telephone Encounter (Signed)
Spoke to pt in regards to her Eliquis copay of $47. Pt is good with paying with $47. She was confused because she was given a copay card in error.  Gov funded programs do not allow their pts to use copay cards. Advised pt if drug cost increase to contact me asap.

## 2020-03-26 MED ORDER — PEGFILGRASTIM-JMDB 6 MG/0.6ML ~~LOC~~ SOSY
PREFILLED_SYRINGE | SUBCUTANEOUS | Status: AC
  Filled 2020-03-26: qty 0.6

## 2020-03-31 ENCOUNTER — Inpatient Hospital Stay (HOSPITAL_COMMUNITY): Payer: Medicare HMO | Attending: Hematology

## 2020-03-31 ENCOUNTER — Encounter (HOSPITAL_COMMUNITY): Payer: Self-pay

## 2020-03-31 ENCOUNTER — Other Ambulatory Visit: Payer: Self-pay

## 2020-03-31 ENCOUNTER — Inpatient Hospital Stay (HOSPITAL_COMMUNITY): Payer: Medicare HMO

## 2020-03-31 VITALS — BP 142/70 | HR 88 | Temp 97.1°F | Resp 18

## 2020-03-31 DIAGNOSIS — Z5111 Encounter for antineoplastic chemotherapy: Secondary | ICD-10-CM | POA: Diagnosis present

## 2020-03-31 DIAGNOSIS — Z170 Estrogen receptor positive status [ER+]: Secondary | ICD-10-CM

## 2020-03-31 DIAGNOSIS — C50812 Malignant neoplasm of overlapping sites of left female breast: Secondary | ICD-10-CM | POA: Diagnosis present

## 2020-03-31 DIAGNOSIS — Z95828 Presence of other vascular implants and grafts: Secondary | ICD-10-CM

## 2020-03-31 LAB — CBC WITH DIFFERENTIAL/PLATELET
Abs Immature Granulocytes: 0.05 10*3/uL (ref 0.00–0.07)
Basophils Absolute: 0.1 10*3/uL (ref 0.0–0.1)
Basophils Relative: 1 %
Eosinophils Absolute: 0.1 10*3/uL (ref 0.0–0.5)
Eosinophils Relative: 1 %
HCT: 27.9 % — ABNORMAL LOW (ref 36.0–46.0)
Hemoglobin: 9.1 g/dL — ABNORMAL LOW (ref 12.0–15.0)
Immature Granulocytes: 1 %
Lymphocytes Relative: 26 %
Lymphs Abs: 1.4 10*3/uL (ref 0.7–4.0)
MCH: 29.4 pg (ref 26.0–34.0)
MCHC: 32.6 g/dL (ref 30.0–36.0)
MCV: 90 fL (ref 80.0–100.0)
Monocytes Absolute: 0.3 10*3/uL (ref 0.1–1.0)
Monocytes Relative: 5 %
Neutro Abs: 3.7 10*3/uL (ref 1.7–7.7)
Neutrophils Relative %: 66 %
Platelets: 282 10*3/uL (ref 150–400)
RBC: 3.1 MIL/uL — ABNORMAL LOW (ref 3.87–5.11)
RDW: 17 % — ABNORMAL HIGH (ref 11.5–15.5)
WBC: 5.6 10*3/uL (ref 4.0–10.5)
nRBC: 0 % (ref 0.0–0.2)

## 2020-03-31 LAB — COMPREHENSIVE METABOLIC PANEL
ALT: 19 U/L (ref 0–44)
AST: 23 U/L (ref 15–41)
Albumin: 3.2 g/dL — ABNORMAL LOW (ref 3.5–5.0)
Alkaline Phosphatase: 66 U/L (ref 38–126)
Anion gap: 7 (ref 5–15)
BUN: 17 mg/dL (ref 8–23)
CO2: 25 mmol/L (ref 22–32)
Calcium: 8.5 mg/dL — ABNORMAL LOW (ref 8.9–10.3)
Chloride: 101 mmol/L (ref 98–111)
Creatinine, Ser: 0.7 mg/dL (ref 0.44–1.00)
GFR, Estimated: >60 mL/min (ref >60)
Glucose, Bld: 173 mg/dL — ABNORMAL HIGH (ref 70–99)
Potassium: 3.3 mmol/L — ABNORMAL LOW (ref 3.5–5.1)
Sodium: 133 mmol/L — ABNORMAL LOW (ref 135–145)
Total Bilirubin: 0.5 mg/dL (ref 0.3–1.2)
Total Protein: 6.5 g/dL (ref 6.5–8.1)

## 2020-03-31 MED ORDER — SODIUM CHLORIDE 0.9 % IV SOLN
10.0000 mg | Freq: Once | INTRAVENOUS | Status: AC
  Administered 2020-03-31: 10 mg via INTRAVENOUS
  Filled 2020-03-31: qty 10

## 2020-03-31 MED ORDER — SODIUM CHLORIDE 0.9 % IV SOLN: Freq: Once | INTRAVENOUS | Status: AC

## 2020-03-31 MED ORDER — HEPARIN SOD (PORK) LOCK FLUSH 100 UNIT/ML IV SOLN
500.0000 [IU] | Freq: Once | INTRAVENOUS | Status: AC | PRN
  Administered 2020-03-31: 500 [IU]

## 2020-03-31 MED ORDER — FAMOTIDINE IN NACL 20-0.9 MG/50ML-% IV SOLN
20.0000 mg | Freq: Once | INTRAVENOUS | Status: AC
  Administered 2020-03-31: 20 mg via INTRAVENOUS
  Filled 2020-03-31: qty 50

## 2020-03-31 MED ORDER — SODIUM CHLORIDE 0.9 % IV SOLN
80.0000 mg/m2 | Freq: Once | INTRAVENOUS | Status: AC
  Administered 2020-03-31: 192 mg via INTRAVENOUS
  Filled 2020-03-31: qty 32

## 2020-03-31 MED ORDER — DIPHENHYDRAMINE HCL 50 MG/ML IJ SOLN
50.0000 mg | Freq: Once | INTRAMUSCULAR | Status: AC
  Administered 2020-03-31: 50 mg via INTRAVENOUS
  Filled 2020-03-31: qty 1

## 2020-03-31 MED ORDER — SODIUM CHLORIDE 0.9 % IV SOLN
Freq: Once | INTRAVENOUS | Status: AC
  Administered 2020-03-31: 8 mg via INTRAVENOUS
  Filled 2020-03-31: qty 4

## 2020-03-31 NOTE — Progress Notes (Signed)
Tolerated infusion w/o adverse reaction.  Alert, in no distress.  VSS.  Discharged via wheelchair in stable condition in c/o spouse.

## 2020-04-02 ENCOUNTER — Other Ambulatory Visit (HOSPITAL_COMMUNITY): Payer: Self-pay

## 2020-04-02 ENCOUNTER — Other Ambulatory Visit (HOSPITAL_COMMUNITY): Payer: Self-pay | Admitting: *Deleted

## 2020-04-02 MED ORDER — APIXABAN 5 MG PO TABS: 5.0000 mg | ORAL_TABLET | Freq: Two times a day (BID) | ORAL | 3 refills | Status: DC

## 2020-04-07 ENCOUNTER — Inpatient Hospital Stay (HOSPITAL_COMMUNITY): Payer: Medicare HMO

## 2020-04-07 ENCOUNTER — Other Ambulatory Visit: Payer: Self-pay

## 2020-04-07 VITALS — BP 126/66 | HR 93 | Temp 96.8°F | Resp 18 | Wt 283.8 lb

## 2020-04-07 DIAGNOSIS — Z95828 Presence of other vascular implants and grafts: Secondary | ICD-10-CM

## 2020-04-07 DIAGNOSIS — Z170 Estrogen receptor positive status [ER+]: Secondary | ICD-10-CM

## 2020-04-07 DIAGNOSIS — Z5111 Encounter for antineoplastic chemotherapy: Secondary | ICD-10-CM | POA: Diagnosis not present

## 2020-04-07 DIAGNOSIS — C50812 Malignant neoplasm of overlapping sites of left female breast: Secondary | ICD-10-CM

## 2020-04-07 LAB — CBC WITH DIFFERENTIAL/PLATELET
Abs Immature Granulocytes: 0.08 10*3/uL — ABNORMAL HIGH (ref 0.00–0.07)
Basophils Absolute: 0.1 10*3/uL (ref 0.0–0.1)
Basophils Relative: 1 %
Eosinophils Absolute: 0.1 10*3/uL (ref 0.0–0.5)
Eosinophils Relative: 1 %
HCT: 29.9 % — ABNORMAL LOW (ref 36.0–46.0)
Hemoglobin: 9.6 g/dL — ABNORMAL LOW (ref 12.0–15.0)
Immature Granulocytes: 2 %
Lymphocytes Relative: 38 %
Lymphs Abs: 1.7 10*3/uL (ref 0.7–4.0)
MCH: 29.2 pg (ref 26.0–34.0)
MCHC: 32.1 g/dL (ref 30.0–36.0)
MCV: 90.9 fL (ref 80.0–100.0)
Monocytes Absolute: 0.4 10*3/uL (ref 0.1–1.0)
Monocytes Relative: 8 %
Neutro Abs: 2.3 10*3/uL (ref 1.7–7.7)
Neutrophils Relative %: 50 %
Platelets: 331 10*3/uL (ref 150–400)
RBC: 3.29 MIL/uL — ABNORMAL LOW (ref 3.87–5.11)
RDW: 18 % — ABNORMAL HIGH (ref 11.5–15.5)
WBC: 4.5 10*3/uL (ref 4.0–10.5)
nRBC: 0.4 % — ABNORMAL HIGH (ref 0.0–0.2)

## 2020-04-07 LAB — COMPREHENSIVE METABOLIC PANEL
ALT: 21 U/L (ref 0–44)
AST: 24 U/L (ref 15–41)
Albumin: 3.4 g/dL — ABNORMAL LOW (ref 3.5–5.0)
Alkaline Phosphatase: 72 U/L (ref 38–126)
Anion gap: 10 (ref 5–15)
BUN: 19 mg/dL (ref 8–23)
CO2: 23 mmol/L (ref 22–32)
Calcium: 9.1 mg/dL (ref 8.9–10.3)
Chloride: 100 mmol/L (ref 98–111)
Creatinine, Ser: 0.72 mg/dL (ref 0.44–1.00)
GFR, Estimated: >60 mL/min (ref >60)
Glucose, Bld: 122 mg/dL — ABNORMAL HIGH (ref 70–99)
Potassium: 3.7 mmol/L (ref 3.5–5.1)
Sodium: 133 mmol/L — ABNORMAL LOW (ref 135–145)
Total Bilirubin: 0.5 mg/dL (ref 0.3–1.2)
Total Protein: 7.1 g/dL (ref 6.5–8.1)

## 2020-04-07 MED ORDER — SODIUM CHLORIDE 0.9 % IV SOLN
80.0000 mg/m2 | Freq: Once | INTRAVENOUS | Status: AC
  Administered 2020-04-07: 192 mg via INTRAVENOUS
  Filled 2020-04-07: qty 32

## 2020-04-07 MED ORDER — SODIUM CHLORIDE 0.9 % IV SOLN
10.0000 mg | Freq: Once | INTRAVENOUS | Status: AC
  Administered 2020-04-07: 10 mg via INTRAVENOUS
  Filled 2020-04-07: qty 10

## 2020-04-07 MED ORDER — FAMOTIDINE IN NACL 20-0.9 MG/50ML-% IV SOLN
20.0000 mg | Freq: Once | INTRAVENOUS | Status: AC
  Administered 2020-04-07: 20 mg via INTRAVENOUS
  Filled 2020-04-07: qty 50

## 2020-04-07 MED ORDER — SODIUM CHLORIDE 0.9 % IV SOLN: Freq: Once | INTRAVENOUS | Status: AC

## 2020-04-07 MED ORDER — DIPHENHYDRAMINE HCL 50 MG/ML IJ SOLN
50.0000 mg | Freq: Once | INTRAMUSCULAR | Status: AC
  Administered 2020-04-07: 50 mg via INTRAVENOUS
  Filled 2020-04-07: qty 1

## 2020-04-07 MED ORDER — SODIUM CHLORIDE 0.9 % IV SOLN
Freq: Once | INTRAVENOUS | Status: AC
  Administered 2020-04-07: 8 mg via INTRAVENOUS
  Filled 2020-04-07: qty 4

## 2020-04-07 MED ORDER — SODIUM CHLORIDE 0.9% FLUSH
10.0000 mL | INTRAVENOUS | Status: DC | PRN
  Administered 2020-04-07: 10 mL

## 2020-04-07 MED ORDER — HEPARIN SOD (PORK) LOCK FLUSH 100 UNIT/ML IV SOLN
500.0000 [IU] | Freq: Once | INTRAVENOUS | Status: AC | PRN
  Administered 2020-04-07: 500 [IU]

## 2020-04-07 NOTE — Progress Notes (Signed)
Crystal Vega presents today for [phone] Taxol. Pt denies any new changes or symptoms since last treatment. Lab results and vitals have been reviewed and are stable and within parameters for treatment. Proceeding with treatment today as planned.  Infusions tolerated without incident or complaint. VSS upon completion of treatment. Port flushed and deaccessed per protocol, see MAR and IV flowsheet for details. Discharged in satisfactory condition with follow up instructions.

## 2020-04-07 NOTE — Patient Instructions (Signed)
[facility] Discharge Instructions for Patients Receiving Chemotherapy   Beginning January 23rd 2017 lab work for the Cancer Center will be done in the  Main lab at [facility] on 1st floor. If you have a lab appointment with the Cancer Center please come in thru the  Main Entrance and check in at the main information desk   Today you received the following chemotherapy agents Taxol  To help prevent nausea and vomiting after your treatment, we encourage you to take your nausea medication      If you develop nausea and vomiting, or diarrhea that is not controlled by your medication, call the clinic.  The clinic phone number is [phone]. Office hours are Monday-Friday 8:30am-5:00pm.  BELOW ARE SYMPTOMS THAT SHOULD BE REPORTED IMMEDIATELY:  *FEVER GREATER THAN 101.0 F  *CHILLS WITH OR WITHOUT FEVER  NAUSEA AND VOMITING THAT IS NOT CONTROLLED WITH YOUR NAUSEA MEDICATION  *UNUSUAL SHORTNESS OF BREATH  *UNUSUAL BRUISING OR BLEEDING  TENDERNESS IN MOUTH AND THROAT WITH OR WITHOUT PRESENCE OF ULCERS  *URINARY PROBLEMS  *BOWEL PROBLEMS  UNUSUAL RASH Items with * indicate a potential emergency and should be followed up as soon as possible. If you have an emergency after office hours please contact your primary care physician or go to the nearest emergency department.  Please call the clinic during office hours if you have any questions or concerns.   You may also contact the Patient Navigator at [phone] should you have any questions or need assistance in obtaining follow up care.      Resources For Cancer Patients and their Caregivers ? American Cancer Society: Can assist with transportation, wigs, general needs, runs Look Good Feel Better.        1-888-227-6333 ? Cancer Care: Provides financial assistance, online support groups, medication/co-pay assistance.  1-800-813-HOPE (4673) ? [facility] Assists [place]  cancer patients and their families through emotional , educational and financial support.  336-427-4357 ? [place] DSS Where to apply for food stamps, Medicaid and utility assistance. 336-342-1394 ? [facility]: Transportation to medical appointments. 336-347-2287 ? Social Security Administration: May apply for disability if have a Stage IV cancer. 336-342-7796 1-800-772-1213 ? [place] Aging, Disability and Transit Services: Assists with nutrition, care and transit needs. 336-349-2343         

## 2020-04-21 ENCOUNTER — Inpatient Hospital Stay (HOSPITAL_COMMUNITY): Payer: Medicare HMO

## 2020-04-21 ENCOUNTER — Inpatient Hospital Stay (HOSPITAL_COMMUNITY): Payer: Medicare HMO | Admitting: Hematology

## 2020-04-21 ENCOUNTER — Other Ambulatory Visit: Payer: Self-pay

## 2020-04-21 VITALS — BP 114/70 | HR 95 | Temp 98.2°F | Resp 18

## 2020-04-21 VITALS — BP 119/72 | HR 114 | Temp 97.0°F | Resp 18

## 2020-04-21 DIAGNOSIS — C50812 Malignant neoplasm of overlapping sites of left female breast: Secondary | ICD-10-CM

## 2020-04-21 DIAGNOSIS — Z170 Estrogen receptor positive status [ER+]: Secondary | ICD-10-CM

## 2020-04-21 DIAGNOSIS — Z95828 Presence of other vascular implants and grafts: Secondary | ICD-10-CM

## 2020-04-21 DIAGNOSIS — Z5111 Encounter for antineoplastic chemotherapy: Secondary | ICD-10-CM | POA: Diagnosis not present

## 2020-04-21 LAB — CBC WITH DIFFERENTIAL/PLATELET
Abs Immature Granulocytes: 0.13 10*3/uL — ABNORMAL HIGH (ref 0.00–0.07)
Basophils Absolute: 0.1 10*3/uL (ref 0.0–0.1)
Basophils Relative: 1 %
Eosinophils Absolute: 0.1 10*3/uL (ref 0.0–0.5)
Eosinophils Relative: 2 %
HCT: 30.1 % — ABNORMAL LOW (ref 36.0–46.0)
Hemoglobin: 9.6 g/dL — ABNORMAL LOW (ref 12.0–15.0)
Immature Granulocytes: 2 %
Lymphocytes Relative: 26 %
Lymphs Abs: 1.7 10*3/uL (ref 0.7–4.0)
MCH: 29.3 pg (ref 26.0–34.0)
MCHC: 31.9 g/dL (ref 30.0–36.0)
MCV: 91.8 fL (ref 80.0–100.0)
Monocytes Absolute: 1 10*3/uL (ref 0.1–1.0)
Monocytes Relative: 16 %
Neutro Abs: 3.6 10*3/uL (ref 1.7–7.7)
Neutrophils Relative %: 53 %
Platelets: 338 10*3/uL (ref 150–400)
RBC: 3.28 MIL/uL — ABNORMAL LOW (ref 3.87–5.11)
RDW: 17.4 % — ABNORMAL HIGH (ref 11.5–15.5)
WBC: 6.6 10*3/uL (ref 4.0–10.5)
nRBC: 0.5 % — ABNORMAL HIGH (ref 0.0–0.2)

## 2020-04-21 LAB — COMPREHENSIVE METABOLIC PANEL
ALT: 17 U/L (ref 0–44)
AST: 23 U/L (ref 15–41)
Albumin: 3.3 g/dL — ABNORMAL LOW (ref 3.5–5.0)
Alkaline Phosphatase: 70 U/L (ref 38–126)
Anion gap: 8 (ref 5–15)
BUN: 18 mg/dL (ref 8–23)
CO2: 26 mmol/L (ref 22–32)
Calcium: 8.8 mg/dL — ABNORMAL LOW (ref 8.9–10.3)
Chloride: 103 mmol/L (ref 98–111)
Creatinine, Ser: 0.81 mg/dL (ref 0.44–1.00)
GFR, Estimated: >60 mL/min (ref >60)
Glucose, Bld: 146 mg/dL — ABNORMAL HIGH (ref 70–99)
Potassium: 3.6 mmol/L (ref 3.5–5.1)
Sodium: 137 mmol/L (ref 135–145)
Total Bilirubin: 0.3 mg/dL (ref 0.3–1.2)
Total Protein: 7.1 g/dL (ref 6.5–8.1)

## 2020-04-21 MED ORDER — SODIUM CHLORIDE 0.9 % IV SOLN: Freq: Once | INTRAVENOUS | Status: AC

## 2020-04-21 MED ORDER — SODIUM CHLORIDE 0.9% FLUSH
10.0000 mL | INTRAVENOUS | Status: DC | PRN
  Administered 2020-04-21 (×2): 10 mL

## 2020-04-21 MED ORDER — SODIUM CHLORIDE 0.9 % IV SOLN
Freq: Once | INTRAVENOUS | Status: AC
  Administered 2020-04-21: 8 mg via INTRAVENOUS
  Filled 2020-04-21: qty 4

## 2020-04-21 MED ORDER — FAMOTIDINE IN NACL 20-0.9 MG/50ML-% IV SOLN
20.0000 mg | Freq: Once | INTRAVENOUS | Status: AC
  Administered 2020-04-21: 20 mg via INTRAVENOUS
  Filled 2020-04-21: qty 50

## 2020-04-21 MED ORDER — OCTREOTIDE ACETATE 30 MG IM KIT
PACK | INTRAMUSCULAR | Status: AC
  Filled 2020-04-21: qty 1

## 2020-04-21 MED ORDER — SODIUM CHLORIDE 0.9 % IV SOLN
10.0000 mg | Freq: Once | INTRAVENOUS | Status: AC
  Administered 2020-04-21: 10 mg via INTRAVENOUS
  Filled 2020-04-21: qty 10

## 2020-04-21 MED ORDER — DIPHENHYDRAMINE HCL 50 MG/ML IJ SOLN
50.0000 mg | Freq: Once | INTRAMUSCULAR | Status: AC
  Administered 2020-04-21: 50 mg via INTRAVENOUS
  Filled 2020-04-21: qty 1

## 2020-04-21 MED ORDER — DIPHENHYDRAMINE HCL 50 MG/ML IJ SOLN
INTRAMUSCULAR | Status: AC
  Filled 2020-04-21: qty 1

## 2020-04-21 MED ORDER — SODIUM CHLORIDE 0.9 % IV SOLN
80.0000 mg/m2 | Freq: Once | INTRAVENOUS | Status: AC
  Administered 2020-04-21: 192 mg via INTRAVENOUS
  Filled 2020-04-21: qty 32

## 2020-04-21 MED ORDER — HEPARIN SOD (PORK) LOCK FLUSH 100 UNIT/ML IV SOLN
500.0000 [IU] | Freq: Once | INTRAVENOUS | Status: AC | PRN
  Administered 2020-04-21: 500 [IU]

## 2020-04-21 NOTE — Progress Notes (Signed)
[facility] [place], [place] [address]   CLINIC:  Medical Oncology/Hematology  PCP:  Crystal Evens, Vega [address] / [place] [address] [phone]   REASON FOR VISIT:  Follow-up for left breast cancer  PRIOR THERAPY: None  NGS Results: Not done  CURRENT THERAPY: Paclitaxel 3 weeks on, 1 week off  BRIEF ONCOLOGIC HISTORY:  Oncology History  Malignant neoplasm of overlapping sites of left breast in female, estrogen receptor positive ([place])  11/16/2019 Cancer Staging   Staging form: Breast, AJCC 8th Edition - Clinical stage from 11/16/2019: Stage IV (cT4b, cN1, cM1, G3, ER+, PR-, HER2-) - Signed by Crystal Vega on 12/17/2019   Vega Initial Diagnosis   Patient noted intermittent left breast pain and swelling for several months. Mammogram showed a 6.2cm mass in the left breast axillary tail with an adjacent 0.9cm mass, 2 abnormal left axillary lymph nodes, 1 abnormal right axillary lymph node, and diffuse left breast edema and skin thickening. Biopsy showed IDC, grade 3 in the left breast and bilateral axillas, HER-2 negative (1+), ER+ 100%, PR- 0%, Ki67 30%.    12/31/2019 -  Chemotherapy   The patient had ondansetron (ZOFRAN) 8 mg in sodium chloride 0.9 % 50 mL IVPB, , Intravenous,  Once, 4 of 6 cycles Administration: 8 mg (12/31/2019), 8 mg (01/07/2020), 8 mg (01/28/2020), 8 mg (02/05/2020), 8 mg (01/14/2020), 8 mg (02/11/2020), 8 mg (02/25/2020), 8 mg (03/03/2020), 8 mg (03/10/2020), 8 mg (03/24/2020), 8 mg (03/31/2020), 8 mg (04/07/2020) PACLitaxel (TAXOL) 192 mg in sodium chloride 0.9 % 250 mL chemo infusion (</= 89m/m2), 80 mg/m2 = 192 mg, Intravenous,  Once, 4 of 6 cycles Administration: 192 mg (12/31/2019), 192 mg (01/07/2020), 192 mg (01/28/2020), 192 mg (02/05/2020), 192 mg (01/14/2020), 192 mg (02/11/2020), 192 mg (02/25/2020), 192 mg (03/03/2020), 192 mg (03/10/2020), 192 mg (03/24/2020), 192 mg (03/31/2020), 192 mg (04/07/2020)  for chemotherapy  treatment.    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 female, returns for routine follow-up and consideration for next cycle of chemotherapy. AShanninwas last seen on 03/24/2020.  Due for cycle #5 of paclitaxel today.   Overall, she tells me she has been feeling okay. She complains of left breast discomfort and tightness, most prominent lateral to the left breast, but denies breast pain or left arm swelling. Her left arm continues having intermittent numbness. Adjusting the bra no longer helps as it did before. She denies having numbness or tingling. She tolerated the previous treatment well and denies having any side effects. She reports having nosebleeds when she blows her nose,  mainly from the left nostril, which stops on its own. She continues taking Xarelto BID and denies having hematochezia or hematuria.  She denies having any previous cancer. She has not had a colonoscopy before, though she was scheduled for one with Dr. Gala Vega on 8/9.  Overall, she feels ready for next cycle of chemo today.     REVIEW OF SYSTEMS:  Review of Systems  Constitutional: Positive for appetite change (75%) and fatigue (50%).  HENT:   Positive for nosebleeds (from L nostril when blowing nose).   Respiratory: Positive for chest tightness (1/10 lateral to L breast).   Cardiovascular: Negative for chest pain.  Gastrointestinal: Negative for blood in stool.  Genitourinary: Negative for hematuria.   Musculoskeletal: Positive for myalgias (sore fingers).  Neurological: Positive for numbness (intermittent in L arm).  All other systems reviewed and are negative.   PAST MEDICAL/SURGICAL HISTORY:  Past Medical History:  Diagnosis Date   Anemia    Arthritis    per patient " left knee"   Essential hypertension, benign    Family history of cancer of female genital organ    Family history of GI tract cancer    Metastatic breast cancer ([place])    left breast   Port-A-Cath in place 12/25/2019   Type 2 diabetes mellitus ([place])    Past Surgical History:  Procedure Laterality Date   CATARACT EXTRACTION W/ INTRAOCULAR LENS IMPLANT Right    HYSTEROSCOPY WITH D & C N/A 06/05/2014   Procedure: DILATATION AND CURETTAGE /HYSTEROSCOPY;  Surgeon: Crystal Buff, Vega;  Location: [facility];  Service: Gynecology;  Laterality: N/A;   POLYPECTOMY N/A 06/05/2014   Procedure: ENDOMETRIAL POLYPECTOMY;  Surgeon: Crystal Buff, Vega;  Location: [facility];  Service: Gynecology;  Laterality: N/A;   PORTACATH PLACEMENT N/A 12/14/2019   Procedure: INSERTION PORT-A-CATH WITH ULTRASOUND GUIDANCE;  Surgeon: Crystal Mesa, Vega;  Location: [facility];  Service: General;  Laterality: N/A;   TUBAL LIGATION      SOCIAL HISTORY:  Social History   Socioeconomic History   Marital status: Married    Spouse name: Crystal Vega   Number of children: 2   Years of education: Not on file   Highest education level: Not on file  Occupational History   Occupation: retired  Tobacco Use   Smoking status: Former Smoker    Types: Cigarettes    Smokeless tobacco: Never Used   Tobacco comment: quit about 40+ years ago  Electronics engineer Use   Vaping Use: Never used  Substance and Sexual Activity   Alcohol use: Never   Drug use: No   Sexual activity: Not on file  Other Topics Concern   Not on file  Social History Narrative   Not on file   Social Determinants of Health   Financial Resource Strain: Low Risk    Difficulty of Paying Living Expenses: Not hard at all  Food Insecurity: No Food Insecurity   Worried About Charity fundraiser in the Last Year: Never true   [place] in the Last Year: Never true  Transportation Needs: No Transportation Needs   Lack of Transportation (Medical): No   Lack of Transportation (Non-Medical): No  Physical Activity: Inactive   Days of Exercise per Week: 0 days   Minutes of Exercise per Session: 0 min  Stress: No Stress Concern Present   Feeling of Stress : Only a little  Social Connections: Moderately Integrated   Frequency of Communication with Friends and Family: Three times a week  Frequency of Social Gatherings with Friends and Family: Three times a week   Attends Religious Services: 1 to 4 times per year   Active Member of Clubs or Organizations: No   Attends Archivist Meetings: Never   Marital Status: Married  Human resources officer Violence: Not At Risk   Fear of Current or Ex-Partner: No   Emotionally Abused: No   Physically Abused: No   Sexually Abused: No    FAMILY HISTORY:  Family History  Problem Relation Age of Onset   Diabetes Mellitus II Father    Congestive Heart Failure Father    Cancer Mother        Deceased at age 53; patient not sure if cancer was in her stomach or colon   Stroke Maternal Grandmother    Cancer Cousin        female reproductive cancer, dx. 50s/60s    CURRENT MEDICATIONS:  Current Outpatient Medications  Medication Sig Dispense Refill   apixaban (ELIQUIS) 5 MG TABS tablet Take 1 tablet (5 mg total) by  mouth 2 (two) times daily. 60 tablet 3   ferrous sulfate 325 (65 FE) MG tablet Take 325 mg by mouth daily with breakfast.     hydrochlorothiazide (HYDRODIURIL) 25 MG tablet Take 25 mg by mouth daily.     HYDROcodone-acetaminophen (NORCO/VICODIN) 5-325 MG tablet Take 1 tablet by mouth every 6 (six) hours as needed for moderate pain. 15 tablet 0   HYDROmorphone (DILAUDID) 2 MG tablet Take 1 tablet (2 mg total) by mouth every 4 (four) hours as needed for severe pain. 30 tablet 0   ibuprofen (ADVIL,MOTRIN) 800 MG tablet Take 800 mg by mouth every 8 (eight) hours as needed for moderate pain.      lidocaine-prilocaine (EMLA) cream Apply a small amount to port a cath site and cover with plastic wrap 1 hour prior to chemotherapy appointments 30 g 3   losartan (COZAAR) 50 MG tablet Take 50 mg by mouth daily.     metFORMIN (GLUCOPHAGE) 500 MG tablet Take 500 mg by mouth daily with breakfast.      oxybutynin (DITROPAN) 5 MG tablet Take 5 mg by mouth daily.      PACLitaxel (TAXOL IV) Inject 80 mg/m2 into the vein once a week. Day 1, 8, 15 every 28 days     pantoprazole (PROTONIX) 40 MG tablet Take 40 mg by mouth daily.     potassium chloride (MICRO-K) 10 MEQ CR capsule Take 10 mEq by mouth daily.      pravastatin (PRAVACHOL) 10 MG tablet Take 10 mg by mouth daily.      prochlorperazine (COMPAZINE) 10 MG tablet Take 1 tablet (10 mg total) by mouth every 6 (six) hours as needed (Nausea or vomiting). 30 tablet 1   RIVAROXABAN (XARELTO) VTE STARTER PACK (15 & 20 MG TABLETS) Follow package directions: Take one 39m tablet by mouth twice a day. On day 22, switch to one 21mtablet once a day. Take with food. 51 each 0   No current facility-administered medications for this visit.    ALLERGIES:  No Known Allergies  PHYSICAL EXAM:  Performance status (ECOG): 1 - Symptomatic but completely ambulatory  Vitals:   04/21/20 0815  BP: 119/72  Pulse: (!) 114  Resp: 18  Temp: (!) 97 F (36.1 C)   SpO2: 99%   Wt Readings from Last 3 Encounters:  04/21/20 292 lb 12.8 oz (132.8 kg)  04/07/20 283 lb 12.8 oz (128.7 kg)  03/31/20 287 lb  6.4 oz (130.4 kg)   Physical Exam Vitals reviewed.  Constitutional:      Appearance: Normal appearance. She is obese.  Cardiovascular:     Rate and Rhythm: Normal rate and regular rhythm.     Pulses: Normal pulses.     Heart sounds: Normal heart sounds.  Pulmonary:     Effort: Pulmonary effort is normal.     Breath sounds: Normal breath sounds.  Chest:     Breasts:        Left: Mass (upper-outer and upper mid masses) present. No nipple discharge, skin change or tenderness.     Comments: Port-a-Cath in R chest Lymphadenopathy:     Upper Body:     Left upper body: Supraclavicular adenopathy (2-3 cm LN present) present. No axillary or pectoral adenopathy.  Neurological:     General: No focal deficit present.     Mental Status: She is alert and oriented to person, place, and time.  Psychiatric:        Mood and Affect: Mood normal.        Behavior: Behavior normal.     LABORATORY DATA:  I have reviewed the labs as listed.  CBC Latest Ref Rng & Units 04/21/2020 04/07/2020 03/31/2020  WBC 4.0 - 10.5 K/uL 6.6 4.5 5.6  Hemoglobin 12.0 - 15.0 g/dL 9.6(L) 9.6(L) 9.1(L)  Hematocrit 36 - 46 % 30.1(L) 29.9(L) 27.9(L)  Platelets 150 - 400 K/uL 338 331 282   CMP Latest Ref Rng & Units 04/21/2020 04/07/2020 03/31/2020  Glucose 70 - 99 mg/dL 146(H) 122(H) 173(H)  BUN 8 - 23 mg/dL 18 19 17   Creatinine 0.44 - 1.00 mg/dL 0.81 0.72 0.70  Sodium 135 - 145 mmol/L 137 133(L) 133(L)  Potassium 3.5 - 5.1 mmol/L 3.6 3.7 3.3(L)  Chloride 98 - 111 mmol/L 103 100 101  CO2 22 - 32 mmol/L 26 23 25   Calcium 8.9 - 10.3 mg/dL 8.8(L) 9.1 8.5(L)  Total Protein 6.5 - 8.1 g/dL 7.1 7.1 6.5  Total Bilirubin 0.3 - 1.2 mg/dL 0.3 0.5 0.5  Alkaline Phos 38 - 126 U/L 70 72 66  AST 15 - 41 U/L 23 24 23   ALT 0 - 44 U/L 17 21 19     DIAGNOSTIC IMAGING:  I have independently  reviewed the scans and discussed with the patient. No results found.   ASSESSMENT:  1. 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 -MRI of the breast on 12/10/2019 shows left breast malignancy involving all 4 quadrants and skin of the left breast including 0.5 cm biopsy-proven malignancy within the far posterior outer right breast, abnormal highly suspicious nonmuscle like enhancement within all 4 quadrants of the left breast and diffuse skin thickening. This abnormal non-mass-like enhancement and skin thickening extends across the midline to the medial right breast. At least 7 abnormal left axillary lymph nodes and 1 abnormal right axillary lymph node compatible with metastatic disease. -CT CAP on 12/12/2019 shows bulky lymphadenopathy in the left subpectoral and axillary regions with largest lymph node measuring 3.3 cm. Mild mediastinal adenopathy in the right paratracheal prevascular and lateral aortic regions, largest lymph node in the lateral aortic region measuring 2.4 cm. Lymphadenopathy in the inferior jugular and supraclavicular regions, left side greater than right. Mild lymphadenopathy in the right axilla. -Multiple small hypervascular lesions seen in the right and left  lobesconsistent with liver metastasis. -Bone scan on 12/12/2019 did not show any bone metastasis. -CT CAP on 12/29/2019 showed possible lytic lesion in the T4 vertebral body. -Liver biopsy on 12/28/2019 consistent with metastatic breast cancer. -Paclitaxel weekly 3 weeks on 1 week off started on 12/31/2019. -CT CAP on 03/20/2020 shows mild decrease in the left axillary, subpectoral, supraclavicular, mediastinal and lower jugular lymphadenopathy.  Stable small low-attenuation liver lesion.  No new progressive metastatic  disease.  Stable lesion in the left sternal manubrium and T4 vertebral body.  Stable short segment concentric wall thickening of the right colon with subcentimeter pericolonic lymph nodes.  We will monitor this finding as she has metastatic breast cancer.  She is asymptomatic.  2. Right colonic mass: -CT scan showed constricting mass involving the ascending colon measuring 3.6 x 3.5 cm with a small 1 cm right pericolonic lymph node. -She never had a colonoscopy. Denies any bleeding per rectum or melena.  3. Left leg DVT: -Doppler on 03/03/2020 shows left femoral vein, popliteal vein and posterior tibial vein deep vein thrombosis.   PLAN:  1. Metastatic left breast cancerto the liver: -Reported some tightness in the left breast area but not pain. -Examination showed mass in the outer quadrant of the left breast.  This appears improved significantly from prior to start of therapy. -Reviewed labs which showed normal LFTs. -Recommend proceeding with cycle 5 of paclitaxel today.  RTC 5 weeks.  2. Right colonic mass: -Currently asymptomatic.  No bleeding per rectum. -She would like to have colonoscopy done after chemotherapy completed.  3. Left breast and left upper extremity pain: -Pain is completely gone after starting chemotherapy. -However she reports tightness in the left breast area.  4.Left leg DVT: -Continue Eliquis twice daily.  No bleeding issues.   Orders placed this encounter:  Orders Placed This Encounter  Procedures   Cancer antigen 15-3   Cancer antigen 27.29     Crystal Vega [place] [phone]   I, Milinda Antis, am acting as a scribe for Dr. Sanda Linger.  I, Crystal Vega, have reviewed the above documentation for accuracy and completeness, and I agree with the above.

## 2020-04-21 NOTE — Progress Notes (Signed)
Patient was assessed by Dr. Katragadda and labs have been reviewed.  Patient is okay to proceed with treatment today. Primary RN and pharmacy aware.   

## 2020-04-21 NOTE — Progress Notes (Signed)
Pt here for D1C5 of taxol.  Labs reviewed with Dr Raliegh Ip, okay for treatment today.   Tolerated treatment well today, discharged in stable condition via wheelchair.  Vital signs stable prior to discharge.

## 2020-04-21 NOTE — Patient Instructions (Addendum)
[facility] Discharge Instructions  You were seen today by Dr. Delton Coombes. He went over your recent results. You received your treatment today; continue getting your weekly treatment. Wait for 3 months since having your last blood clot on October 4th to schedule a colonoscopy with Dr. Gala Romney. Dr. Delton Coombes will see you back in 5 weeks for labs and follow up.   Thank you for choosing [facility] at [facility] to provide your oncology and hematology care.  To afford each patient quality time with our provider, please arrive at least 15 minutes before your scheduled appointment time.   If you have a lab appointment with the [facility] please come in thru the Main Entrance and check in at the main information desk  You need to re-schedule your appointment should you arrive 10 or more minutes late.  We strive to give you quality time with our providers, and arriving late affects you and other patients whose appointments are after yours.  Also, if you no show three or more times for appointments you may be dismissed from the clinic at the providers discretion.     Again, thank you for choosing [facility].  Our hope is that these requests will decrease the amount of time that you wait before being seen by our physicians.       _____________________________________________________________  Should you have questions after your visit to [facility], please contact our office at [phone] between the hours of 8:00 a.m. and 4:30 p.m.  Voicemails left after 4:00 p.m. will not be returned until the following business day.  For prescription refill requests, have your pharmacy contact our office and allow 72 hours.    Cancer Center Support Programs:   > Cancer Support Group  2nd Tuesday of the month 1pm-2pm, Journey Room

## 2020-04-21 NOTE — Patient Instructions (Signed)
[facility] Discharge Instructions for Patients Receiving Chemotherapy  Today you received the following chemotherapy agents   To help prevent nausea and vomiting after your treatment, we encourage you to take your nausea medication   If you develop nausea and vomiting that is not controlled by your nausea medication, call the clinic.   BELOW ARE SYMPTOMS THAT SHOULD BE REPORTED IMMEDIATELY:  *FEVER GREATER THAN 100.5 F  *CHILLS WITH OR WITHOUT FEVER  NAUSEA AND VOMITING THAT IS NOT CONTROLLED WITH YOUR NAUSEA MEDICATION  *UNUSUAL SHORTNESS OF BREATH  *UNUSUAL BRUISING OR BLEEDING  TENDERNESS IN MOUTH AND THROAT WITH OR WITHOUT PRESENCE OF ULCERS  *URINARY PROBLEMS  *BOWEL PROBLEMS  UNUSUAL RASH Items with * indicate a potential emergency and should be followed up as soon as possible.  Feel free to call the clinic should you have any questions or concerns. The clinic phone number is [phone].  Please show the CHEMO ALERT CARD at check-in to the Emergency Department and triage nurse.   

## 2020-04-22 MED ORDER — DIPHENHYDRAMINE HCL 25 MG PO CAPS
ORAL_CAPSULE | ORAL | Status: AC
  Filled 2020-04-22: qty 1

## 2020-04-28 ENCOUNTER — Inpatient Hospital Stay (HOSPITAL_COMMUNITY): Payer: Medicare HMO

## 2020-04-28 ENCOUNTER — Other Ambulatory Visit: Payer: Self-pay

## 2020-04-28 ENCOUNTER — Encounter (HOSPITAL_COMMUNITY): Payer: Self-pay

## 2020-04-28 VITALS — BP 124/65 | HR 90 | Temp 98.2°F | Resp 18

## 2020-04-28 DIAGNOSIS — Z5111 Encounter for antineoplastic chemotherapy: Secondary | ICD-10-CM | POA: Diagnosis not present

## 2020-04-28 DIAGNOSIS — Z95828 Presence of other vascular implants and grafts: Secondary | ICD-10-CM

## 2020-04-28 DIAGNOSIS — Z170 Estrogen receptor positive status [ER+]: Secondary | ICD-10-CM

## 2020-04-28 DIAGNOSIS — C50812 Malignant neoplasm of overlapping sites of left female breast: Secondary | ICD-10-CM

## 2020-04-28 LAB — CBC WITH DIFFERENTIAL/PLATELET
Abs Immature Granulocytes: 0.08 10*3/uL — ABNORMAL HIGH (ref 0.00–0.07)
Basophils Absolute: 0.1 10*3/uL (ref 0.0–0.1)
Basophils Relative: 2 %
Eosinophils Absolute: 0.1 10*3/uL (ref 0.0–0.5)
Eosinophils Relative: 2 %
HCT: 29.7 % — ABNORMAL LOW (ref 36.0–46.0)
Hemoglobin: 9.3 g/dL — ABNORMAL LOW (ref 12.0–15.0)
Immature Granulocytes: 1 %
Lymphocytes Relative: 27 %
Lymphs Abs: 1.7 10*3/uL (ref 0.7–4.0)
MCH: 28.4 pg (ref 26.0–34.0)
MCHC: 31.3 g/dL (ref 30.0–36.0)
MCV: 90.8 fL (ref 80.0–100.0)
Monocytes Absolute: 0.4 10*3/uL (ref 0.1–1.0)
Monocytes Relative: 6 %
Neutro Abs: 4 10*3/uL (ref 1.7–7.7)
Neutrophils Relative %: 62 %
Platelets: 321 10*3/uL (ref 150–400)
RBC: 3.27 MIL/uL — ABNORMAL LOW (ref 3.87–5.11)
RDW: 16.8 % — ABNORMAL HIGH (ref 11.5–15.5)
WBC: 6.3 10*3/uL (ref 4.0–10.5)
nRBC: 0 % (ref 0.0–0.2)

## 2020-04-28 LAB — COMPREHENSIVE METABOLIC PANEL
ALT: 18 U/L (ref 0–44)
AST: 24 U/L (ref 15–41)
Albumin: 3.2 g/dL — ABNORMAL LOW (ref 3.5–5.0)
Alkaline Phosphatase: 64 U/L (ref 38–126)
Anion gap: 7 (ref 5–15)
BUN: 16 mg/dL (ref 8–23)
CO2: 24 mmol/L (ref 22–32)
Calcium: 8.7 mg/dL — ABNORMAL LOW (ref 8.9–10.3)
Chloride: 103 mmol/L (ref 98–111)
Creatinine, Ser: 0.74 mg/dL (ref 0.44–1.00)
GFR, Estimated: >60 mL/min (ref >60)
Glucose, Bld: 161 mg/dL — ABNORMAL HIGH (ref 70–99)
Potassium: 3.2 mmol/L — ABNORMAL LOW (ref 3.5–5.1)
Sodium: 134 mmol/L — ABNORMAL LOW (ref 135–145)
Total Bilirubin: 0.3 mg/dL (ref 0.3–1.2)
Total Protein: 7 g/dL (ref 6.5–8.1)

## 2020-04-28 MED ORDER — SODIUM CHLORIDE 0.9 % IV SOLN
Freq: Once | INTRAVENOUS | Status: AC
  Administered 2020-04-28: 8 mg via INTRAVENOUS
  Filled 2020-04-28: qty 4

## 2020-04-28 MED ORDER — SODIUM CHLORIDE 0.9 % IV SOLN
10.0000 mg | Freq: Once | INTRAVENOUS | Status: AC
  Administered 2020-04-28: 10 mg via INTRAVENOUS
  Filled 2020-04-28: qty 10

## 2020-04-28 MED ORDER — SODIUM CHLORIDE 0.9 % IV SOLN
80.0000 mg/m2 | Freq: Once | INTRAVENOUS | Status: AC
  Administered 2020-04-28: 192 mg via INTRAVENOUS
  Filled 2020-04-28: qty 32

## 2020-04-28 MED ORDER — FAMOTIDINE IN NACL 20-0.9 MG/50ML-% IV SOLN
20.0000 mg | Freq: Once | INTRAVENOUS | Status: AC
  Administered 2020-04-28: 20 mg via INTRAVENOUS
  Filled 2020-04-28: qty 50

## 2020-04-28 MED ORDER — SODIUM CHLORIDE 0.9% FLUSH
10.0000 mL | INTRAVENOUS | Status: DC | PRN
  Administered 2020-04-28: 10 mL

## 2020-04-28 MED ORDER — SODIUM CHLORIDE 0.9 % IV SOLN: Freq: Once | INTRAVENOUS | Status: AC

## 2020-04-28 MED ORDER — HEPARIN SOD (PORK) LOCK FLUSH 100 UNIT/ML IV SOLN
500.0000 [IU] | Freq: Once | INTRAVENOUS | Status: AC | PRN
  Administered 2020-04-28: 500 [IU]

## 2020-04-28 MED ORDER — DIPHENHYDRAMINE HCL 50 MG/ML IJ SOLN
50.0000 mg | Freq: Once | INTRAMUSCULAR | Status: AC
  Administered 2020-04-28: 50 mg via INTRAVENOUS
  Filled 2020-04-28: qty 1

## 2020-04-28 NOTE — Patient Instructions (Signed)
[facility] at [facility] Discharge Instructions  Labs drawn from portacath today   Thank you for choosing [facility] at [facility] to provide your oncology and hematology care.  To afford each patient quality time with our provider, please arrive at least 15 minutes before your scheduled appointment time.   If you have a lab appointment with the Cancer Center please come in thru the Main Entrance and check in at the main information desk.  You need to re-schedule your appointment should you arrive 10 or more minutes late.  We strive to give you quality time with our providers, and arriving late affects you and other patients whose appointments are after yours.  Also, if you no show three or more times for appointments you may be dismissed from the clinic at the providers discretion.     Again, thank you for choosing [facility].  Our hope is that these requests will decrease the amount of time that you wait before being seen by our physicians.       _____________________________________________________________  Should you have questions after your visit to  Cancer Center, please contact our office at [phone] and follow the prompts.  Our office hours are 8:00 a.m. and 4:30 p.m. Monday - Friday.  Please note that voicemails left after 4:00 p.m. may not be returned until the following business day.  We are closed weekends and major holidays.  You do have access to a nurse 24-7, just call the main number to the clinic [phone] and do not press any options, hold on the line and a nurse will answer the phone.    For prescription refill requests, have your pharmacy contact our office and allow 72 hours.    Due to Covid, you will need to wear a mask upon entering the hospital. If you do not have a mask, a mask will be given to you at the Main Entrance upon arrival. For doctor visits, patients may have 1 support person age 18  or older with them. For treatment visits, patients can not have anyone with them due to social distancing guidelines and our immunocompromised population.     

## 2020-04-28 NOTE — Progress Notes (Signed)
Crystal Vega tolerated Taxol infusion well without complaints or incident.Labs reviewed prior to administering this medication. Potassium 3.2 and pt reports she has not taken her Potassium pills for the last week but she will restart today. VSS upon discharge. Pt discharged via wheelchair in satisfactory condition

## 2020-04-28 NOTE — Patient Instructions (Signed)
[facility] Discharge Instructions for Patients Receiving Chemotherapy   Beginning January 23rd 2017 lab work for the Cancer Center will be done in the  Main lab at [facility] on 1st floor. If you have a lab appointment with the Cancer Center please come in thru the  Main Entrance and check in at the main information desk   Today you received the following chemotherapy agents Taxol. Follow-up as scheduled  To help prevent nausea and vomiting after your treatment, we encourage you to take your nausea medication   If you develop nausea and vomiting, or diarrhea that is not controlled by your medication, call the clinic.  The clinic phone number is [phone]. Office hours are Monday-Friday 8:30am-5:00pm.  BELOW ARE SYMPTOMS THAT SHOULD BE REPORTED IMMEDIATELY:  *FEVER GREATER THAN 101.0 F  *CHILLS WITH OR WITHOUT FEVER  NAUSEA AND VOMITING THAT IS NOT CONTROLLED WITH YOUR NAUSEA MEDICATION  *UNUSUAL SHORTNESS OF BREATH  *UNUSUAL BRUISING OR BLEEDING  TENDERNESS IN MOUTH AND THROAT WITH OR WITHOUT PRESENCE OF ULCERS  *URINARY PROBLEMS  *BOWEL PROBLEMS  UNUSUAL RASH Items with * indicate a potential emergency and should be followed up as soon as possible. If you have an emergency after office hours please contact your primary care physician or go to the nearest emergency department.  Please call the clinic during office hours if you have any questions or concerns.   You may also contact the Patient Navigator at [phone] should you have any questions or need assistance in obtaining follow up care.      Resources For Cancer Patients and their Caregivers ? American Cancer Society: Can assist with transportation, wigs, general needs, runs Look Good Feel Better.        1-888-227-6333 ? Cancer Care: Provides financial assistance, online support groups, medication/co-pay assistance.  1-800-813-HOPE (4673) ? [facility] Assists  [place] cancer patients and their families through emotional , educational and financial support.  336-427-4357 ? [place] DSS Where to apply for food stamps, Medicaid and utility assistance. 336-342-1394 ? [employer]: Transportation to medical appointments. 336-347-2287 ? Social Security Administration: May apply for disability if have a Stage IV cancer. 336-342-7796 1-800-772-1213 ? [place] Aging, Disability and Transit Services: Assists with nutrition, care and transit needs. 336-349-2343         

## 2020-05-05 ENCOUNTER — Inpatient Hospital Stay (HOSPITAL_COMMUNITY): Payer: Medicare HMO

## 2020-05-05 ENCOUNTER — Other Ambulatory Visit: Payer: Self-pay

## 2020-05-05 ENCOUNTER — Encounter (HOSPITAL_COMMUNITY): Payer: Self-pay

## 2020-05-05 ENCOUNTER — Inpatient Hospital Stay (HOSPITAL_COMMUNITY): Payer: Medicare HMO | Attending: Hematology

## 2020-05-05 VITALS — BP 116/76 | HR 89 | Temp 96.6°F | Resp 18

## 2020-05-05 DIAGNOSIS — Z5111 Encounter for antineoplastic chemotherapy: Secondary | ICD-10-CM | POA: Insufficient documentation

## 2020-05-05 DIAGNOSIS — Z87891 Personal history of nicotine dependence: Secondary | ICD-10-CM | POA: Diagnosis not present

## 2020-05-05 DIAGNOSIS — Z8249 Family history of ischemic heart disease and other diseases of the circulatory system: Secondary | ICD-10-CM | POA: Diagnosis not present

## 2020-05-05 DIAGNOSIS — Z86718 Personal history of other venous thrombosis and embolism: Secondary | ICD-10-CM | POA: Insufficient documentation

## 2020-05-05 DIAGNOSIS — C787 Secondary malignant neoplasm of liver and intrahepatic bile duct: Secondary | ICD-10-CM | POA: Diagnosis not present

## 2020-05-05 DIAGNOSIS — C50812 Malignant neoplasm of overlapping sites of left female breast: Secondary | ICD-10-CM | POA: Insufficient documentation

## 2020-05-05 DIAGNOSIS — Z95828 Presence of other vascular implants and grafts: Secondary | ICD-10-CM

## 2020-05-05 DIAGNOSIS — E119 Type 2 diabetes mellitus without complications: Secondary | ICD-10-CM | POA: Insufficient documentation

## 2020-05-05 DIAGNOSIS — Z170 Estrogen receptor positive status [ER+]: Secondary | ICD-10-CM

## 2020-05-05 DIAGNOSIS — Z79899 Other long term (current) drug therapy: Secondary | ICD-10-CM | POA: Diagnosis not present

## 2020-05-05 DIAGNOSIS — Z833 Family history of diabetes mellitus: Secondary | ICD-10-CM | POA: Diagnosis not present

## 2020-05-05 DIAGNOSIS — I10 Essential (primary) hypertension: Secondary | ICD-10-CM | POA: Insufficient documentation

## 2020-05-05 DIAGNOSIS — Z7984 Long term (current) use of oral hypoglycemic drugs: Secondary | ICD-10-CM | POA: Diagnosis not present

## 2020-05-05 DIAGNOSIS — Z7901 Long term (current) use of anticoagulants: Secondary | ICD-10-CM | POA: Insufficient documentation

## 2020-05-05 LAB — COMPREHENSIVE METABOLIC PANEL
ALT: 19 U/L (ref 0–44)
AST: 23 U/L (ref 15–41)
Albumin: 3.2 g/dL — ABNORMAL LOW (ref 3.5–5.0)
Alkaline Phosphatase: 65 U/L (ref 38–126)
Anion gap: 7 (ref 5–15)
BUN: 19 mg/dL (ref 8–23)
CO2: 26 mmol/L (ref 22–32)
Calcium: 8.6 mg/dL — ABNORMAL LOW (ref 8.9–10.3)
Chloride: 102 mmol/L (ref 98–111)
Creatinine, Ser: 0.74 mg/dL (ref 0.44–1.00)
GFR, Estimated: >60 mL/min (ref >60)
Glucose, Bld: 186 mg/dL — ABNORMAL HIGH (ref 70–99)
Potassium: 3.6 mmol/L (ref 3.5–5.1)
Sodium: 135 mmol/L (ref 135–145)
Total Bilirubin: 0.5 mg/dL (ref 0.3–1.2)
Total Protein: 6.7 g/dL (ref 6.5–8.1)

## 2020-05-05 LAB — CBC WITH DIFFERENTIAL/PLATELET
Abs Immature Granulocytes: 0.05 10*3/uL (ref 0.00–0.07)
Basophils Absolute: 0.1 10*3/uL (ref 0.0–0.1)
Basophils Relative: 2 %
Eosinophils Absolute: 0.1 10*3/uL (ref 0.0–0.5)
Eosinophils Relative: 2 %
HCT: 28.5 % — ABNORMAL LOW (ref 36.0–46.0)
Hemoglobin: 9.1 g/dL — ABNORMAL LOW (ref 12.0–15.0)
Immature Granulocytes: 1 %
Lymphocytes Relative: 34 %
Lymphs Abs: 1.3 10*3/uL (ref 0.7–4.0)
MCH: 29.4 pg (ref 26.0–34.0)
MCHC: 31.9 g/dL (ref 30.0–36.0)
MCV: 92.2 fL (ref 80.0–100.0)
Monocytes Absolute: 0.3 10*3/uL (ref 0.1–1.0)
Monocytes Relative: 7 %
Neutro Abs: 2.2 10*3/uL (ref 1.7–7.7)
Neutrophils Relative %: 54 %
Platelets: 289 10*3/uL (ref 150–400)
RBC: 3.09 MIL/uL — ABNORMAL LOW (ref 3.87–5.11)
RDW: 17.9 % — ABNORMAL HIGH (ref 11.5–15.5)
WBC: 4 10*3/uL (ref 4.0–10.5)
nRBC: 0.5 % — ABNORMAL HIGH (ref 0.0–0.2)

## 2020-05-05 LAB — URINALYSIS, ROUTINE W REFLEX MICROSCOPIC
Bacteria, UA: NONE SEEN
Bilirubin Urine: NEGATIVE
Glucose, UA: NEGATIVE mg/dL
Ketones, ur: NEGATIVE mg/dL
Nitrite: NEGATIVE
Protein, ur: NEGATIVE mg/dL
RBC / HPF: >50 RBC/hpf — ABNORMAL HIGH (ref 0–5)
Specific Gravity, Urine: 1.017 (ref 1.005–1.030)
pH: 5 (ref 5.0–8.0)

## 2020-05-05 MED ORDER — FAMOTIDINE IN NACL 20-0.9 MG/50ML-% IV SOLN
20.0000 mg | Freq: Once | INTRAVENOUS | Status: AC
  Administered 2020-05-05: 20 mg via INTRAVENOUS

## 2020-05-05 MED ORDER — DIPHENHYDRAMINE HCL 50 MG/ML IJ SOLN
50.0000 mg | Freq: Once | INTRAMUSCULAR | Status: AC
  Administered 2020-05-05: 50 mg via INTRAVENOUS

## 2020-05-05 MED ORDER — SODIUM CHLORIDE 0.9 % IV SOLN: Freq: Once | INTRAVENOUS | Status: AC

## 2020-05-05 MED ORDER — SODIUM CHLORIDE 0.9 % IV SOLN
10.0000 mg | Freq: Once | INTRAVENOUS | Status: AC
  Administered 2020-05-05: 10 mg via INTRAVENOUS
  Filled 2020-05-05: qty 10

## 2020-05-05 MED ORDER — SODIUM CHLORIDE 0.9 % IV SOLN
80.0000 mg/m2 | Freq: Once | INTRAVENOUS | Status: AC
  Administered 2020-05-05: 192 mg via INTRAVENOUS
  Filled 2020-05-05: qty 32

## 2020-05-05 MED ORDER — HEPARIN SOD (PORK) LOCK FLUSH 100 UNIT/ML IV SOLN
500.0000 [IU] | Freq: Once | INTRAVENOUS | Status: AC | PRN
  Administered 2020-05-05: 500 [IU]

## 2020-05-05 MED ORDER — DIPHENHYDRAMINE HCL 50 MG/ML IJ SOLN
INTRAMUSCULAR | Status: AC
  Filled 2020-05-05: qty 1

## 2020-05-05 MED ORDER — SODIUM CHLORIDE 0.9% FLUSH
10.0000 mL | INTRAVENOUS | Status: DC | PRN
  Administered 2020-05-05: 10 mL

## 2020-05-05 MED ORDER — FAMOTIDINE IN NACL 20-0.9 MG/50ML-% IV SOLN
INTRAVENOUS | Status: AC
  Filled 2020-05-05: qty 50

## 2020-05-05 MED ORDER — SODIUM CHLORIDE 0.9 % IV SOLN
Freq: Once | INTRAVENOUS | Status: AC
  Administered 2020-05-05: 8 mg via INTRAVENOUS
  Filled 2020-05-05: qty 4

## 2020-05-05 NOTE — Patient Instructions (Signed)
[facility] Discharge Instructions  Labs drawn from portacath today   Thank you for choosing [facility] at [facility] to provide your oncology and hematology care.  To afford each patient quality time with our provider, please arrive at least 15 minutes before your scheduled appointment time.   If you have a lab appointment with the Cancer Center please come in thru the Main Entrance and check in at the main information desk.  You need to re-schedule your appointment should you arrive 10 or more minutes late.  We strive to give you quality time with our providers, and arriving late affects you and other patients whose appointments are after yours.  Also, if you no show three or more times for appointments you may be dismissed from the clinic at the providers discretion.     Again, thank you for choosing [facility].  Our hope is that these requests will decrease the amount of time that you wait before being seen by our physicians.       _____________________________________________________________  Should you have questions after your visit to [facility], please contact our office at [phone] and follow the prompts.  Our office hours are 8:00 a.m. and 4:30 p.m. Monday - Friday.  Please note that voicemails left after 4:00 p.m. may not be returned until the following business day.  We are closed weekends and major holidays.  You do have access to a nurse 24-7, just call the main number to the clinic [phone] and do not press any options, hold on the line and a nurse will answer the phone.    For prescription refill requests, have your pharmacy contact our office and allow 72 hours.    Due to Covid, you will need to wear a mask upon entering the hospital. If you do not have a mask, a mask will be given to you at the Main Entrance upon arrival. For doctor visits, patients may have 1 support person age 18  or older with them. For treatment visits, patients can not have anyone with them due to social distancing guidelines and our immunocompromised population.     

## 2020-05-05 NOTE — Patient Instructions (Signed)
[facility] Discharge Instructions for Patients Receiving Chemotherapy  Today you received the following chemotherapy agents   To help prevent nausea and vomiting after your treatment, we encourage you to take your nausea medication   If you develop nausea and vomiting that is not controlled by your nausea medication, call the clinic.   BELOW ARE SYMPTOMS THAT SHOULD BE REPORTED IMMEDIATELY:  *FEVER GREATER THAN 100.5 F  *CHILLS WITH OR WITHOUT FEVER  NAUSEA AND VOMITING THAT IS NOT CONTROLLED WITH YOUR NAUSEA MEDICATION  *UNUSUAL SHORTNESS OF BREATH  *UNUSUAL BRUISING OR BLEEDING  TENDERNESS IN MOUTH AND THROAT WITH OR WITHOUT PRESENCE OF ULCERS  *URINARY PROBLEMS  *BOWEL PROBLEMS  UNUSUAL RASH Items with * indicate a potential emergency and should be followed up as soon as possible.  Feel free to call the clinic should you have any questions or concerns. The clinic phone number is [phone].  Please show the CHEMO ALERT CARD at check-in to the Emergency Department and triage nurse.   

## 2020-05-05 NOTE — Progress Notes (Signed)
C5D15 of taxol.  Pt complaining of blood in urine.  Urinalysis ordered and will follow up.  Labs and vital signs WNL for treatment.   Tolerated treatment well without incidence.  Vital signs stable prior to discharge. Discharged in stable condition via wheelchair.

## 2020-05-06 NOTE — Progress Notes (Signed)
Farris Has, was she having symptoms of a urinary tract infection?  Faythe Casa, NP 05/06/2020 8:37 AM

## 2020-05-06 NOTE — Progress Notes (Signed)
She said she had noticed blood in urine and lower back pain.

## 2020-05-07 ENCOUNTER — Other Ambulatory Visit: Payer: Self-pay | Admitting: Oncology

## 2020-05-07 MED ORDER — SULFAMETHOXAZOLE-TRIMETHOPRIM 800-160 MG PO TABS: 1.0000 | ORAL_TABLET | Freq: Two times a day (BID) | ORAL | 0 refills | Status: DC

## 2020-05-07 NOTE — Progress Notes (Signed)
Also, can we add an culture?  Faythe Casa, NP 05/07/2020 3:24 PM

## 2020-05-07 NOTE — Progress Notes (Signed)
Re: UTI  Patient c/o hematuria to RN during her infusion.   UA shows possible UTI. I don't believe we ordered a culture.   Will order culture and start her on Bactrim. NKDA.   Patient called and notified.   Faythe Casa, NP 05/07/2020 3:24 PM

## 2020-05-07 NOTE — Progress Notes (Signed)
Okay- we can call her in an antibiotics.   Would you mind calling her and letting her know we have called an Anitbiotic in for her?  Faythe Casa, NP 05/07/2020 3:20 PM

## 2020-05-13 NOTE — Progress Notes (Signed)
TY

## 2020-05-19 ENCOUNTER — Other Ambulatory Visit (HOSPITAL_COMMUNITY): Payer: Self-pay

## 2020-05-19 DIAGNOSIS — R300 Dysuria: Secondary | ICD-10-CM

## 2020-05-19 DIAGNOSIS — Z170 Estrogen receptor positive status [ER+]: Secondary | ICD-10-CM

## 2020-05-19 NOTE — Progress Notes (Signed)
ua

## 2020-05-20 ENCOUNTER — Other Ambulatory Visit: Payer: Self-pay

## 2020-05-20 ENCOUNTER — Inpatient Hospital Stay (HOSPITAL_COMMUNITY): Payer: Medicare HMO

## 2020-05-20 VITALS — BP 129/59 | HR 89 | Temp 97.0°F | Resp 18

## 2020-05-20 DIAGNOSIS — Z5111 Encounter for antineoplastic chemotherapy: Secondary | ICD-10-CM | POA: Diagnosis not present

## 2020-05-20 DIAGNOSIS — Z95828 Presence of other vascular implants and grafts: Secondary | ICD-10-CM

## 2020-05-20 DIAGNOSIS — C50812 Malignant neoplasm of overlapping sites of left female breast: Secondary | ICD-10-CM

## 2020-05-20 DIAGNOSIS — R300 Dysuria: Secondary | ICD-10-CM

## 2020-05-20 DIAGNOSIS — Z170 Estrogen receptor positive status [ER+]: Secondary | ICD-10-CM

## 2020-05-20 LAB — CBC WITH DIFFERENTIAL/PLATELET
Abs Immature Granulocytes: 0.18 10*3/uL — ABNORMAL HIGH (ref 0.00–0.07)
Basophils Absolute: 0.1 10*3/uL (ref 0.0–0.1)
Basophils Relative: 1 %
Eosinophils Absolute: 0.1 10*3/uL (ref 0.0–0.5)
Eosinophils Relative: 2 %
HCT: 30.1 % — ABNORMAL LOW (ref 36.0–46.0)
Hemoglobin: 9.4 g/dL — ABNORMAL LOW (ref 12.0–15.0)
Immature Granulocytes: 3 %
Lymphocytes Relative: 27 %
Lymphs Abs: 1.9 10*3/uL (ref 0.7–4.0)
MCH: 28.1 pg (ref 26.0–34.0)
MCHC: 31.2 g/dL (ref 30.0–36.0)
MCV: 90.1 fL (ref 80.0–100.0)
Monocytes Absolute: 1.1 10*3/uL — ABNORMAL HIGH (ref 0.1–1.0)
Monocytes Relative: 15 %
Neutro Abs: 3.7 10*3/uL (ref 1.7–7.7)
Neutrophils Relative %: 52 %
Platelets: 297 10*3/uL (ref 150–400)
RBC: 3.34 MIL/uL — ABNORMAL LOW (ref 3.87–5.11)
RDW: 17.2 % — ABNORMAL HIGH (ref 11.5–15.5)
WBC: 7.2 10*3/uL (ref 4.0–10.5)
nRBC: 0 % (ref 0.0–0.2)

## 2020-05-20 LAB — URINALYSIS, ROUTINE W REFLEX MICROSCOPIC
Bacteria, UA: NONE SEEN
Bilirubin Urine: NEGATIVE
Glucose, UA: NEGATIVE mg/dL
Ketones, ur: NEGATIVE mg/dL
Leukocytes,Ua: NEGATIVE
Nitrite: NEGATIVE
Protein, ur: NEGATIVE mg/dL
RBC / HPF: >50 RBC/hpf — ABNORMAL HIGH (ref 0–5)
Specific Gravity, Urine: 1.019 (ref 1.005–1.030)
pH: 5 (ref 5.0–8.0)

## 2020-05-20 LAB — COMPREHENSIVE METABOLIC PANEL
ALT: 18 U/L (ref 0–44)
AST: 26 U/L (ref 15–41)
Albumin: 3.3 g/dL — ABNORMAL LOW (ref 3.5–5.0)
Alkaline Phosphatase: 69 U/L (ref 38–126)
Anion gap: 8 (ref 5–15)
BUN: 23 mg/dL (ref 8–23)
CO2: 22 mmol/L (ref 22–32)
Calcium: 8.7 mg/dL — ABNORMAL LOW (ref 8.9–10.3)
Chloride: 104 mmol/L (ref 98–111)
Creatinine, Ser: 0.99 mg/dL (ref 0.44–1.00)
GFR, Estimated: >60 mL/min (ref >60)
Glucose, Bld: 155 mg/dL — ABNORMAL HIGH (ref 70–99)
Potassium: 3.8 mmol/L (ref 3.5–5.1)
Sodium: 134 mmol/L — ABNORMAL LOW (ref 135–145)
Total Bilirubin: 0.2 mg/dL — ABNORMAL LOW (ref 0.3–1.2)
Total Protein: 6.9 g/dL (ref 6.5–8.1)

## 2020-05-20 MED ORDER — SODIUM CHLORIDE 0.9% FLUSH
10.0000 mL | INTRAVENOUS | Status: DC | PRN
  Administered 2020-05-20: 08:00:00 10 mL

## 2020-05-20 MED ORDER — FAMOTIDINE IN NACL 20-0.9 MG/50ML-% IV SOLN
20.0000 mg | Freq: Once | INTRAVENOUS | Status: AC
  Administered 2020-05-20: 09:00:00 20 mg via INTRAVENOUS
  Filled 2020-05-20: qty 50

## 2020-05-20 MED ORDER — SODIUM CHLORIDE 0.9 % IV SOLN
Freq: Once | INTRAVENOUS | Status: AC
  Administered 2020-05-20: 10:00:00 8 mg via INTRAVENOUS
  Filled 2020-05-20: qty 4

## 2020-05-20 MED ORDER — DIPHENHYDRAMINE HCL 50 MG/ML IJ SOLN
50.0000 mg | Freq: Once | INTRAMUSCULAR | Status: AC
  Administered 2020-05-20: 09:00:00 50 mg via INTRAVENOUS
  Filled 2020-05-20: qty 1

## 2020-05-20 MED ORDER — HEPARIN SOD (PORK) LOCK FLUSH 100 UNIT/ML IV SOLN
500.0000 [IU] | Freq: Once | INTRAVENOUS | Status: AC | PRN
  Administered 2020-05-20: 12:00:00 500 [IU]

## 2020-05-20 MED ORDER — SODIUM CHLORIDE 0.9 % IV SOLN
80.0000 mg/m2 | Freq: Once | INTRAVENOUS | Status: AC
  Administered 2020-05-20: 11:00:00 192 mg via INTRAVENOUS
  Filled 2020-05-20: qty 32

## 2020-05-20 MED ORDER — SODIUM CHLORIDE 0.9 % IV SOLN
10.0000 mg | Freq: Once | INTRAVENOUS | Status: AC
  Administered 2020-05-20: 10:00:00 10 mg via INTRAVENOUS
  Filled 2020-05-20: qty 10

## 2020-05-20 MED ORDER — SODIUM CHLORIDE 0.9 % IV SOLN: Freq: Once | INTRAVENOUS | Status: AC

## 2020-05-20 NOTE — Progress Notes (Signed)
Patients port flushed without difficulty.  Good blood return noted with no bruising or swelling noted at site.  Transparent dressing applied.  Patient left accessed for chemotherapy treatment. 

## 2020-05-20 NOTE — Patient Instructions (Signed)
[facility] Discharge Instructions for Patients Receiving Chemotherapy   Beginning January 23rd 2017 lab work for the Cancer Center will be done in the  Main lab at [facility] on 1st floor. If you have a lab appointment with the Cancer Center please come in thru the  Main Entrance and check in at the main information desk   Today you received the following chemotherapy agents Taxol  To help prevent nausea and vomiting after your treatment, we encourage you to take your nausea medication      If you develop nausea and vomiting, or diarrhea that is not controlled by your medication, call the clinic.  The clinic phone number is [phone]. Office hours are Monday-Friday 8:30am-5:00pm.  BELOW ARE SYMPTOMS THAT SHOULD BE REPORTED IMMEDIATELY:  *FEVER GREATER THAN 101.0 F  *CHILLS WITH OR WITHOUT FEVER  NAUSEA AND VOMITING THAT IS NOT CONTROLLED WITH YOUR NAUSEA MEDICATION  *UNUSUAL SHORTNESS OF BREATH  *UNUSUAL BRUISING OR BLEEDING  TENDERNESS IN MOUTH AND THROAT WITH OR WITHOUT PRESENCE OF ULCERS  *URINARY PROBLEMS  *BOWEL PROBLEMS  UNUSUAL RASH Items with * indicate a potential emergency and should be followed up as soon as possible. If you have an emergency after office hours please contact your primary care physician or go to the nearest emergency department.  Please call the clinic during office hours if you have any questions or concerns.   You may also contact the Patient Navigator at [phone] should you have any questions or need assistance in obtaining follow up care.      Resources For Cancer Patients and their Caregivers ? American Cancer Society: Can assist with transportation, wigs, general needs, runs Look Good Feel Better.        1-888-227-6333 ? Cancer Care: Provides financial assistance, online support groups, medication/co-pay assistance.  1-800-813-HOPE (4673) ? [facility] Assists [place]  cancer patients and their families through emotional , educational and financial support.  336-427-4357 ? [place] DSS Where to apply for food stamps, Medicaid and utility assistance. 336-342-1394 ? [employer]: Transportation to medical appointments. 336-347-2287 ? Social Security Administration: May apply for disability if have a Stage IV cancer. 336-342-7796 1-800-772-1213 ? [place] Aging, Disability and Transit Services: Assists with nutrition, care and transit needs. 336-349-2343         

## 2020-05-20 NOTE — Progress Notes (Signed)
Crystal Vega presents today for D1C6 Taxol. Pt denies any new changes or symptoms since last treatment. Lab results and vitals have been reviewed and are stable and within parameters for treatment. Proceeding with treatment today as planned.

## 2020-05-21 LAB — CANCER ANTIGEN 15-3: CA 15-3: 37 U/mL — ABNORMAL HIGH (ref 0.0–25.0)

## 2020-05-22 LAB — CANCER ANTIGEN 27.29: CA 27.29: 33 U/mL (ref 0.0–38.6)

## 2020-05-26 ENCOUNTER — Other Ambulatory Visit: Payer: Self-pay

## 2020-05-26 ENCOUNTER — Inpatient Hospital Stay (HOSPITAL_COMMUNITY): Payer: Medicare HMO

## 2020-05-26 ENCOUNTER — Inpatient Hospital Stay (HOSPITAL_COMMUNITY): Payer: Medicare HMO | Admitting: Hematology

## 2020-05-26 ENCOUNTER — Encounter (HOSPITAL_COMMUNITY): Payer: Self-pay | Admitting: Hematology

## 2020-05-26 ENCOUNTER — Ambulatory Visit (HOSPITAL_COMMUNITY): Payer: Medicare HMO

## 2020-05-26 VITALS — BP 110/70 | HR 106 | Temp 96.9°F | Wt 290.0 lb

## 2020-05-26 VITALS — BP 114/57 | HR 87 | Temp 96.8°F | Resp 18

## 2020-05-26 DIAGNOSIS — C50812 Malignant neoplasm of overlapping sites of left female breast: Secondary | ICD-10-CM

## 2020-05-26 DIAGNOSIS — Z95828 Presence of other vascular implants and grafts: Secondary | ICD-10-CM

## 2020-05-26 DIAGNOSIS — Z5111 Encounter for antineoplastic chemotherapy: Secondary | ICD-10-CM | POA: Diagnosis not present

## 2020-05-26 DIAGNOSIS — Z170 Estrogen receptor positive status [ER+]: Secondary | ICD-10-CM

## 2020-05-26 LAB — CBC WITH DIFFERENTIAL/PLATELET
Abs Immature Granulocytes: 0.02 10*3/uL (ref 0.00–0.07)
Basophils Absolute: 0.1 10*3/uL (ref 0.0–0.1)
Basophils Relative: 1 %
Eosinophils Absolute: 0.1 10*3/uL (ref 0.0–0.5)
Eosinophils Relative: 1 %
HCT: 29.7 % — ABNORMAL LOW (ref 36.0–46.0)
Hemoglobin: 9.5 g/dL — ABNORMAL LOW (ref 12.0–15.0)
Immature Granulocytes: 0 %
Lymphocytes Relative: 27 %
Lymphs Abs: 1.4 10*3/uL (ref 0.7–4.0)
MCH: 28.5 pg (ref 26.0–34.0)
MCHC: 32 g/dL (ref 30.0–36.0)
MCV: 89.2 fL (ref 80.0–100.0)
Monocytes Absolute: 0.2 10*3/uL (ref 0.1–1.0)
Monocytes Relative: 4 %
Neutro Abs: 3.4 10*3/uL (ref 1.7–7.7)
Neutrophils Relative %: 67 %
Platelets: 268 10*3/uL (ref 150–400)
RBC: 3.33 MIL/uL — ABNORMAL LOW (ref 3.87–5.11)
RDW: 16.9 % — ABNORMAL HIGH (ref 11.5–15.5)
WBC: 5.2 10*3/uL (ref 4.0–10.5)
nRBC: 0 % (ref 0.0–0.2)

## 2020-05-26 LAB — COMPREHENSIVE METABOLIC PANEL
ALT: 18 U/L (ref 0–44)
AST: 23 U/L (ref 15–41)
Albumin: 3.4 g/dL — ABNORMAL LOW (ref 3.5–5.0)
Alkaline Phosphatase: 71 U/L (ref 38–126)
Anion gap: 7 (ref 5–15)
BUN: 20 mg/dL (ref 8–23)
CO2: 25 mmol/L (ref 22–32)
Calcium: 8.8 mg/dL — ABNORMAL LOW (ref 8.9–10.3)
Chloride: 102 mmol/L (ref 98–111)
Creatinine, Ser: 0.75 mg/dL (ref 0.44–1.00)
GFR, Estimated: >60 mL/min (ref >60)
Glucose, Bld: 189 mg/dL — ABNORMAL HIGH (ref 70–99)
Potassium: 3.7 mmol/L (ref 3.5–5.1)
Sodium: 134 mmol/L — ABNORMAL LOW (ref 135–145)
Total Bilirubin: 0.4 mg/dL (ref 0.3–1.2)
Total Protein: 6.9 g/dL (ref 6.5–8.1)

## 2020-05-26 MED ORDER — DIPHENHYDRAMINE HCL 50 MG/ML IJ SOLN
50.0000 mg | Freq: Once | INTRAMUSCULAR | Status: AC
  Administered 2020-05-26: 11:00:00 50 mg via INTRAVENOUS

## 2020-05-26 MED ORDER — SODIUM CHLORIDE 0.9 % IV SOLN
64.0000 mg/m2 | Freq: Once | INTRAVENOUS | Status: AC
  Administered 2020-05-26: 12:00:00 156 mg via INTRAVENOUS
  Filled 2020-05-26: qty 26

## 2020-05-26 MED ORDER — HEPARIN SOD (PORK) LOCK FLUSH 100 UNIT/ML IV SOLN
500.0000 [IU] | Freq: Once | INTRAVENOUS | Status: AC | PRN
  Administered 2020-05-26: 13:00:00 500 [IU]

## 2020-05-26 MED ORDER — FAMOTIDINE IN NACL 20-0.9 MG/50ML-% IV SOLN
INTRAVENOUS | Status: AC
  Filled 2020-05-26: qty 50

## 2020-05-26 MED ORDER — SODIUM CHLORIDE 0.9% FLUSH
10.0000 mL | INTRAVENOUS | Status: DC | PRN
  Administered 2020-05-26: 13:00:00 10 mL

## 2020-05-26 MED ORDER — DIPHENHYDRAMINE HCL 50 MG/ML IJ SOLN
INTRAMUSCULAR | Status: AC
  Filled 2020-05-26: qty 1

## 2020-05-26 MED ORDER — SODIUM CHLORIDE 0.9 % IV SOLN
Freq: Once | INTRAVENOUS | Status: AC
  Administered 2020-05-26: 11:00:00 8 mg via INTRAVENOUS
  Filled 2020-05-26: qty 4

## 2020-05-26 MED ORDER — SODIUM CHLORIDE 0.9 % IV SOLN: Freq: Once | INTRAVENOUS | Status: AC

## 2020-05-26 MED ORDER — FAMOTIDINE IN NACL 20-0.9 MG/50ML-% IV SOLN
20.0000 mg | Freq: Once | INTRAVENOUS | Status: AC
  Administered 2020-05-26: 11:00:00 20 mg via INTRAVENOUS

## 2020-05-26 MED ORDER — SODIUM CHLORIDE 0.9 % IV SOLN
10.0000 mg | Freq: Once | INTRAVENOUS | Status: AC
  Administered 2020-05-26: 11:00:00 10 mg via INTRAVENOUS
  Filled 2020-05-26: qty 10

## 2020-05-26 NOTE — Progress Notes (Signed)
[place], [place] [address]   CLINIC:  Medical Oncology/Hematology  PCP:  Lemmie Evens, MD [address] / [place] [address] [phone]   REASON FOR VISIT:  Follow-up for left breast cancer  PRIOR THERAPY: None  NGS Results: Not done  CURRENT THERAPY: Paclitaxel 3 weeks on, 1 week off  BRIEF ONCOLOGIC HISTORY:  Oncology History  Malignant neoplasm of overlapping sites of left breast in female, estrogen receptor positive ([facility])  11/16/2019 Cancer Staging   Staging form: Breast, AJCC 8th Edition - Clinical stage from 11/16/2019: Stage IV (cT4b, cN1, cM1, G3, ER+, PR-, HER2-) - Signed by [name], MD on 12/17/2019   11/28/2019 Initial Diagnosis   Patient noted intermittent left breast pain and swelling for several months. Mammogram showed a 6.2cm mass in the left breast axillary tail with an adjacent 0.9cm mass, 2 abnormal left axillary lymph nodes, 1 abnormal right axillary lymph node, and diffuse left breast edema and skin thickening. Biopsy showed IDC, grade 3 in the left breast and bilateral axillas, HER-2 negative (1+), ER+ 100%, PR- 0%, Ki67 30%.    12/31/2019 -  Chemotherapy   The patient had ondansetron (ZOFRAN) 8 mg in sodium chloride 0.9 % 50 mL IVPB, , Intravenous,  Once, 6 of 6 cycles Administration: 8 mg (12/31/2019), 8 mg (01/07/2020), 8 mg (01/28/2020), 8 mg (02/05/2020), 8 mg (01/14/2020), 8 mg (02/11/2020), 8 mg (02/25/2020), 8 mg (03/03/2020), 8 mg (03/10/2020), 8 mg (03/24/2020), 8 mg (03/31/2020), 8 mg (04/07/2020), 8 mg (04/21/2020), 8 mg (04/28/2020), 8 mg (05/05/2020), 8 mg (05/20/2020) PACLitaxel (TAXOL) 192 mg in sodium chloride 0.9 % 250 mL chemo infusion (</= 19m/m2), 80 mg/m2 = 192 mg, Intravenous,  Once, 6 of 6 cycles Administration: 192 mg (12/31/2019), 192 mg (01/07/2020), 192 mg (01/28/2020), 192 mg (02/05/2020), 192 mg (01/14/2020), 192 mg (02/11/2020), 192 mg (02/25/2020), 192 mg (03/03/2020), 192 mg (03/10/2020), 192  mg (03/24/2020), 192 mg (03/31/2020), 192 mg (04/07/2020), 192 mg (04/21/2020), 192 mg (04/28/2020), 192 mg (05/05/2020), 192 mg (05/20/2020)  for chemotherapy treatment.    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 female, returns for routine follow-up and consideration for next cycle of chemotherapy. AJanawas last seen on 04/21/2020.  Due for day #8 of cycle #6 of paclitaxel today.   Overall, she tells me she has been feeling okay. She tolerated the previous treatment well, though she reports having soreness and numbness in her fingertips bilaterally and nail changes, along with tingling in her toes, but denies N/V/D or discharge coming from under her fingernails. Her energy levels are  getting worse. She gets nosebleeds when she blows her nose forcefully and had hematuria until she finished her Bactrim; she has not seen any blood in her urine since 12/20.  Overall, she feels ready for next cycle of chemo today.    REVIEW OF SYSTEMS:  Review of Systems  Constitutional: Positive for fatigue (depleted).  Negative for appetite change.  HENT:   Positive for nosebleeds (when blowing nose).   Respiratory: Positive for cough and shortness of breath.   Gastrointestinal: Negative for diarrhea, nausea and vomiting.  Genitourinary: Positive for hematuria (took Bactrim).   Neurological: Positive for numbness (numbness in fingertips bilat, tingling in toes).  All other systems reviewed and are negative.   PAST MEDICAL/SURGICAL HISTORY:  Past Medical History:  Diagnosis Date  . Anemia   . Arthritis    per patient " left knee"  . Essential hypertension, benign   . Family history of cancer of female genital organ   . Family history of GI tract cancer   . Metastatic breast cancer ([facility])    left breast  . Port-A-Cath in place 12/25/2019  . Type 2 diabetes mellitus ([facility])    Past Surgical History:  Procedure Laterality Date  . CATARACT EXTRACTION W/ INTRAOCULAR LENS IMPLANT Right   . HYSTEROSCOPY WITH D & C N/A 06/05/2014   Procedure: DILATATION AND CURETTAGE /HYSTEROSCOPY;  Surgeon: Florian Buff, MD;  Location: [facility];  Service: Gynecology;  Laterality: N/A;  . POLYPECTOMY N/A 06/05/2014   Procedure: ENDOMETRIAL POLYPECTOMY;  Surgeon: Florian Buff, MD;  Location: [facility];  Service: Gynecology;  Laterality: N/A;  . PORTACATH PLACEMENT N/A 12/14/2019   Procedure: INSERTION PORT-A-CATH WITH ULTRASOUND GUIDANCE;  Surgeon: Donnie Mesa, MD;  Location: [facility];  Service: General;  Laterality: N/A;  . TUBAL LIGATION      SOCIAL HISTORY:  Social History   Socioeconomic History  . Marital status: Married    Spouse name: Herbie Baltimore  . Number of children: 2  . Years of education: Not on file  . Highest education level: Not on file  Occupational History  . Occupation: retired  Tobacco Use  . Smoking status: Former Smoker    Types: Cigarettes  . Smokeless tobacco: Never Used  . Tobacco comment: quit about 40+ years ago  Vaping Use  . Vaping Use: Never used  Substance and Sexual Activity  . Alcohol use:  Never  . Drug use: No  . Sexual activity: Not on file  Other Topics Concern  . Not on file  Social History Narrative  . Not on file   Social Determinants of Health   Financial Resource Strain: Low Risk   . Difficulty of Paying Living Expenses: Not hard at all  Food Insecurity: No Food Insecurity  . Worried About Charity fundraiser in the Last Year: Never true  . Ran Out of Food in the Last Year: Never true  Transportation Needs: No Transportation Needs  . Lack of Transportation (Medical): No  . Lack of Transportation (Non-Medical): No  Physical Activity: Inactive  . Days of Exercise per Week: 0 days  . Minutes of Exercise per Session: 0 min  Stress: No Stress Concern Present  . Feeling of Stress : Only a little  Social Connections: Moderately Integrated  . Frequency of Communication with Friends and Family: Three times a week  . Frequency of Social Gatherings with Friends and Family: Three times a week  . Attends Religious Services: 1 to 4 times per year  . Active  Member of Clubs or Organizations: No  . Attends Archivist Meetings: Never  . Marital Status: Married  Human resources officer Violence: Not At Risk  . Fear of Current or Ex-Partner: No  . Emotionally Abused: No  . Physically Abused: No  . Sexually Abused: No    FAMILY HISTORY:  Family History  Problem Relation Age of Onset  . Diabetes Mellitus II Father   . Congestive Heart Failure Father   . Cancer Mother        Deceased at age 80; patient not sure if cancer was in her stomach or colon  . Stroke Maternal Grandmother   . Cancer Cousin        female reproductive cancer, dx. 50s/60s    CURRENT MEDICATIONS:  Current Outpatient Medications  Medication Sig Dispense Refill  . apixaban (ELIQUIS) 5 MG TABS tablet Take 1 tablet (5 mg total) by mouth 2 (two) times daily. 60 tablet 3  . ferrous sulfate 325 (65 FE) MG tablet Take 325 mg by mouth daily with breakfast.    . hydrochlorothiazide (HYDRODIURIL) 25  MG tablet Take 25 mg by mouth daily.    Marland Kitchen HYDROcodone-acetaminophen (NORCO/VICODIN) 5-325 MG tablet Take 1 tablet by mouth every 6 (six) hours as needed for moderate pain. (Patient not taking: Reported on 05/26/2020) 15 tablet 0  . HYDROmorphone (DILAUDID) 2 MG tablet Take 1 tablet (2 mg total) by mouth every 4 (four) hours as needed for severe pain. (Patient not taking: Reported on 05/26/2020) 30 tablet 0  . ibuprofen (ADVIL,MOTRIN) 800 MG tablet Take 800 mg by mouth every 8 (eight) hours as needed for moderate pain.     Marland Kitchen lidocaine-prilocaine (EMLA) cream Apply a small amount to port a cath site and cover with plastic wrap 1 hour prior to chemotherapy appointments 30 g 3  . losartan (COZAAR) 50 MG tablet Take 50 mg by mouth daily.    . metFORMIN (GLUCOPHAGE) 500 MG tablet Take 500 mg by mouth daily with breakfast.     . oxybutynin (DITROPAN) 5 MG tablet Take 5 mg by mouth daily.     Marland Kitchen PACLitaxel (TAXOL IV) Inject 80 mg/m2 into the vein once a week. Day 1, 8, 15 every 28 days    . pantoprazole (PROTONIX) 40 MG tablet Take 40 mg by mouth daily.    . potassium chloride (MICRO-K) 10 MEQ CR capsule Take 10 mEq by mouth daily.     . pravastatin (PRAVACHOL) 10 MG tablet Take 10 mg by mouth daily.     . prochlorperazine (COMPAZINE) 10 MG tablet Take 1 tablet (10 mg total) by mouth every 6 (six) hours as needed (Nausea or vomiting). 30 tablet 1  . RIVAROXABAN (XARELTO) VTE STARTER PACK (15 & 20 MG TABLETS) Follow package directions: Take one 16m tablet by mouth twice a day. On day 22, switch to one 273mtablet once a day. Take with food. 51 each 0   No current facility-administered medications for this visit.    ALLERGIES:  No Known Allergies  PHYSICAL EXAM:  Performance status (ECOG): 1 - Symptomatic but completely ambulatory  Vitals:   05/26/20 0858  BP: 110/70  Pulse: (!) 106  Temp: (!) 96.9 F (36.1 C)  SpO2: 99%   Wt Readings from Last 3 Encounters:  05/26/20 290 lb (131.5 kg)   05/20/20 291 lb (132 kg)  05/05/20 292 lb 12.8 oz (132.8 kg)   Physical Exam Vitals reviewed.  Constitutional:      Appearance: Normal appearance. She  is obese.  Cardiovascular:     Rate and Rhythm: Normal rate and regular rhythm.     Pulses: Normal pulses.     Heart sounds: Normal heart sounds.  Pulmonary:     Effort: Pulmonary effort is normal.     Breath sounds: Normal breath sounds.  Chest:  Breasts:     Left: Supraclavicular adenopathy (stable) present. No axillary adenopathy.      Comments: Port-a-Cath in R chest Musculoskeletal:     Right hand: Swelling (mild in fingertips) present.     Left hand: Swelling (mild in fingertips) present.     Right lower leg: No edema.     Left lower leg: No edema.  Lymphadenopathy:     Upper Body:     Left upper body: Supraclavicular adenopathy (stable) present. No axillary adenopathy.  Neurological:     General: No focal deficit present.     Mental Status: She is alert and oriented to person, place, and time.  Psychiatric:        Mood and Affect: Mood normal.        Behavior: Behavior normal.     LABORATORY DATA:  I have reviewed the labs as listed.  CBC Latest Ref Rng & Units 05/26/2020 05/20/2020 05/05/2020  WBC 4.0 - 10.5 K/uL 5.2 7.2 4.0  Hemoglobin 12.0 - 15.0 g/dL 9.5(L) 9.4(L) 9.1(L)  Hematocrit 36.0 - 46.0 % 29.7(L) 30.1(L) 28.5(L)  Platelets 150 - 400 K/uL 268 297 289   CMP Latest Ref Rng & Units 05/26/2020 05/20/2020 05/05/2020  Glucose 70 - 99 mg/dL 189(H) 155(H) 186(H)  BUN 8 - 23 mg/dL 20 23 19   Creatinine 0.44 - 1.00 mg/dL 0.75 0.99 0.74  Sodium 135 - 145 mmol/L 134(L) 134(L) 135  Potassium 3.5 - 5.1 mmol/L 3.7 3.8 3.6  Chloride 98 - 111 mmol/L 102 104 102  CO2 22 - 32 mmol/L 25 22 26   Calcium 8.9 - 10.3 mg/dL 8.8(L) 8.7(L) 8.6(L)  Total Protein 6.5 - 8.1 g/dL 6.9 6.9 6.7  Total Bilirubin 0.3 - 1.2 mg/dL 0.4 0.2(L) 0.5  Alkaline Phos 38 - 126 U/L 71 69 65  AST 15 - 41 U/L 23 26 23   ALT 0 - 44 U/L 18 18 19      DIAGNOSTIC IMAGING:  I have independently reviewed the scans and discussed with the patient. No results found.   ASSESSMENT:  1. 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 -MRI of the breast on 12/10/2019 shows left breast malignancy involving all 4 quadrants and skin of the left breast including 0.5 cm biopsy-proven malignancy within the far posterior outer right breast, abnormal highly suspicious nonmuscle like enhancement within all 4 quadrants of the left breast and diffuse skin thickening. This abnormal non-mass-like enhancement and skin thickening extends across the midline to the medial right breast. At least 7 abnormal left axillary lymph nodes and 1 abnormal right axillary lymph node compatible with metastatic disease. -CT CAP on 12/12/2019 shows bulky lymphadenopathy in the left subpectoral and axillary regions with largest lymph node measuring 3.3 cm. Mild mediastinal adenopathy in the right paratracheal prevascular and lateral aortic regions, largest lymph node in the lateral aortic region measuring 2.4 cm. Lymphadenopathy in the inferior jugular and supraclavicular regions, left side greater than right. Mild lymphadenopathy in the right axilla. -Multiple small hypervascular lesions seen in  the right and left lobesconsistent with liver metastasis. -Bone scan on 12/12/2019 did not show any bone metastasis. -CT CAP on 12/29/2019 showed possible lytic lesion in the T4 vertebral body. -Liver biopsy on 12/28/2019 consistent with metastatic breast cancer. -Paclitaxel weekly 3 weeks on 1 week off started on 12/31/2019. -CT CAP on 03/20/2020 shows mild decrease in the left axillary, subpectoral, supraclavicular, mediastinal and lower jugular lymphadenopathy. Stable small  low-attenuation liver lesion. No new progressive metastatic disease. Stable lesion in the left sternal manubrium and T4 vertebral body. Stable short segment concentric wall thickening of the right colon with subcentimeter pericolonic lymph nodes. We will monitor this finding as she has metastatic breast cancer. She is asymptomatic.  2. Right colonic mass: -CT scan showed constricting mass involving the ascending colon measuring 3.6 x 3.5 cm with a small 1 cm right pericolonic lymph node. -She never had a colonoscopy. Denies any bleeding per rectum or melena.  3. Left leg DVT: -Doppler on 03/03/2020 shows left femoral vein, popliteal vein and posterior tibial vein deep vein thrombosis.   PLAN:  1. Metastatic left breast cancerto the liver: -Reported numbness in the fingertips and difficulty opening bottle caps. -Reviewed labs today which showed normal LFTs.  CBC shows normal white count. -Recommend decreasing paclitaxel dose by 20% for neuropathy. -Continue weekly paclitaxel.  RTC 3 weeks.  Repeat CT CAP and tumor markers prior to next visit.  2. Right colonic mass: -Does not have any bleeding per rectum.  We are holding off on colonoscopy given her metastatic breast cancer.  3. Left breast and left upper extremity pain: -She has some tightness in the left upper extremity.  Pain has completely resolved.  4.Left leg DVT: -Continue Eliquis twice daily.  No bleeding issues.   Orders placed this encounter:  No orders of the defined types were placed in this encounter.    [name], MD [place] [phone]   I, Milinda Antis, am acting as a scribe for Dr. Sanda Linger.  I, [name] MD, have reviewed the above documentation for accuracy and completeness, and I agree with the above.

## 2020-05-26 NOTE — Patient Instructions (Signed)
[facility] Discharge Instructions for Patients Receiving Chemotherapy  Today you received the following chemotherapy agents   To help prevent nausea and vomiting after your treatment, we encourage you to take your nausea medication   If you develop nausea and vomiting that is not controlled by your nausea medication, call the clinic.   BELOW ARE SYMPTOMS THAT SHOULD BE REPORTED IMMEDIATELY:  *FEVER GREATER THAN 100.5 F  *CHILLS WITH OR WITHOUT FEVER  NAUSEA AND VOMITING THAT IS NOT CONTROLLED WITH YOUR NAUSEA MEDICATION  *UNUSUAL SHORTNESS OF BREATH  *UNUSUAL BRUISING OR BLEEDING  TENDERNESS IN MOUTH AND THROAT WITH OR WITHOUT PRESENCE OF ULCERS  *URINARY PROBLEMS  *BOWEL PROBLEMS  UNUSUAL RASH Items with * indicate a potential emergency and should be followed up as soon as possible.  Feel free to call the clinic should you have any questions or concerns. The clinic phone number is [phone].  Please show the CHEMO ALERT CARD at check-in to the Emergency Department and triage nurse.   

## 2020-05-26 NOTE — Progress Notes (Signed)
Pt here for D8C6 of taxol.  Labs WNL for treatment.  Good for treatment.   Tolerated treatment well today without incidence.  Discharged via wheelchair in stable condition. Vital signs stable prior to discharge.

## 2020-05-26 NOTE — Patient Instructions (Signed)
[place] at [facility] Discharge Instructions  You were seen today by Dr. Delton Coombes. He went over your recent results. You received your treatment today; continue getting your weekly treatment. You will be scheduled for a CT scan of your chest and abdomen before your next visit. Dr. Delton Coombes will see you back in 3 weeks for labs and follow up.   Thank you for choosing [place] at [facility] to provide your oncology and hematology care.  To afford each patient quality time with our provider, please arrive at least 15 minutes before your scheduled appointment time.   If you have a lab appointment with the [facility] please come in thru the Main Entrance and check in at the main information desk  You need to re-schedule your appointment should you arrive 10 or more minutes late.  We strive to give you quality time with our providers, and arriving late affects you and other patients whose appointments are after yours.  Also, if you no show three or more times for appointments you may be dismissed from the clinic at the providers discretion.     Again, thank you for choosing [facility].  Our hope is that these requests will decrease the amount of time that you wait before being seen by our physicians.       _____________________________________________________________  Should you have questions after your visit to [facility], please contact our office at [phone] between the hours of 8:00 a.m. and 4:30 p.m.  Voicemails left after 4:00 p.m. will not be returned until the following business day.  For prescription refill requests, have your pharmacy contact our office and allow 72 hours.    Cancer Center Support Programs:   > Cancer Support Group  2nd Tuesday of the month 1pm-2pm, Journey Room

## 2020-05-26 NOTE — Progress Notes (Signed)
Patients port flushed without difficulty.  Good blood return noted with no bruising or swelling noted at site.  Transparent dressing applied.  Patient left accessed for chemotherapy treatment. 

## 2020-06-02 ENCOUNTER — Encounter (HOSPITAL_COMMUNITY): Payer: Self-pay

## 2020-06-02 ENCOUNTER — Other Ambulatory Visit: Payer: Self-pay

## 2020-06-02 ENCOUNTER — Inpatient Hospital Stay (HOSPITAL_COMMUNITY): Payer: Medicare HMO

## 2020-06-02 ENCOUNTER — Inpatient Hospital Stay (HOSPITAL_COMMUNITY): Payer: Medicare HMO | Attending: Hematology

## 2020-06-02 VITALS — BP 122/66 | HR 92 | Temp 96.9°F | Resp 20

## 2020-06-02 DIAGNOSIS — Z5111 Encounter for antineoplastic chemotherapy: Secondary | ICD-10-CM | POA: Diagnosis present

## 2020-06-02 DIAGNOSIS — C50812 Malignant neoplasm of overlapping sites of left female breast: Secondary | ICD-10-CM | POA: Insufficient documentation

## 2020-06-02 DIAGNOSIS — I10 Essential (primary) hypertension: Secondary | ICD-10-CM | POA: Diagnosis not present

## 2020-06-02 DIAGNOSIS — Z86718 Personal history of other venous thrombosis and embolism: Secondary | ICD-10-CM | POA: Insufficient documentation

## 2020-06-02 DIAGNOSIS — E1142 Type 2 diabetes mellitus with diabetic polyneuropathy: Secondary | ICD-10-CM | POA: Insufficient documentation

## 2020-06-02 DIAGNOSIS — G620 Drug-induced polyneuropathy: Secondary | ICD-10-CM | POA: Insufficient documentation

## 2020-06-02 DIAGNOSIS — Z79899 Other long term (current) drug therapy: Secondary | ICD-10-CM | POA: Diagnosis not present

## 2020-06-02 DIAGNOSIS — Z170 Estrogen receptor positive status [ER+]: Secondary | ICD-10-CM

## 2020-06-02 DIAGNOSIS — Z7984 Long term (current) use of oral hypoglycemic drugs: Secondary | ICD-10-CM | POA: Insufficient documentation

## 2020-06-02 DIAGNOSIS — Z87891 Personal history of nicotine dependence: Secondary | ICD-10-CM | POA: Diagnosis not present

## 2020-06-02 DIAGNOSIS — Z95828 Presence of other vascular implants and grafts: Secondary | ICD-10-CM

## 2020-06-02 DIAGNOSIS — Z7901 Long term (current) use of anticoagulants: Secondary | ICD-10-CM | POA: Diagnosis not present

## 2020-06-02 DIAGNOSIS — M1990 Unspecified osteoarthritis, unspecified site: Secondary | ICD-10-CM | POA: Insufficient documentation

## 2020-06-02 LAB — CBC WITH DIFFERENTIAL/PLATELET
Abs Immature Granulocytes: 0.05 10*3/uL (ref 0.00–0.07)
Basophils Absolute: 0.1 10*3/uL (ref 0.0–0.1)
Basophils Relative: 2 %
Eosinophils Absolute: 0.1 10*3/uL (ref 0.0–0.5)
Eosinophils Relative: 3 %
HCT: 29.2 % — ABNORMAL LOW (ref 36.0–46.0)
Hemoglobin: 9.1 g/dL — ABNORMAL LOW (ref 12.0–15.0)
Immature Granulocytes: 1 %
Lymphocytes Relative: 34 %
Lymphs Abs: 1.3 10*3/uL (ref 0.7–4.0)
MCH: 27.8 pg (ref 26.0–34.0)
MCHC: 31.2 g/dL (ref 30.0–36.0)
MCV: 89.3 fL (ref 80.0–100.0)
Monocytes Absolute: 0.3 10*3/uL (ref 0.1–1.0)
Monocytes Relative: 9 %
Neutro Abs: 1.9 10*3/uL (ref 1.7–7.7)
Neutrophils Relative %: 51 %
Platelets: 283 10*3/uL (ref 150–400)
RBC: 3.27 MIL/uL — ABNORMAL LOW (ref 3.87–5.11)
RDW: 17.2 % — ABNORMAL HIGH (ref 11.5–15.5)
WBC: 3.8 10*3/uL — ABNORMAL LOW (ref 4.0–10.5)
nRBC: 0 % (ref 0.0–0.2)

## 2020-06-02 LAB — COMPREHENSIVE METABOLIC PANEL
ALT: 16 U/L (ref 0–44)
AST: 24 U/L (ref 15–41)
Albumin: 3.3 g/dL — ABNORMAL LOW (ref 3.5–5.0)
Alkaline Phosphatase: 67 U/L (ref 38–126)
Anion gap: 9 (ref 5–15)
BUN: 18 mg/dL (ref 8–23)
CO2: 25 mmol/L (ref 22–32)
Calcium: 8.7 mg/dL — ABNORMAL LOW (ref 8.9–10.3)
Chloride: 103 mmol/L (ref 98–111)
Creatinine, Ser: 0.74 mg/dL (ref 0.44–1.00)
GFR, Estimated: >60 mL/min (ref >60)
Glucose, Bld: 191 mg/dL — ABNORMAL HIGH (ref 70–99)
Potassium: 3.6 mmol/L (ref 3.5–5.1)
Sodium: 137 mmol/L (ref 135–145)
Total Bilirubin: 0.3 mg/dL (ref 0.3–1.2)
Total Protein: 6.9 g/dL (ref 6.5–8.1)

## 2020-06-02 MED ORDER — SODIUM CHLORIDE 0.9 % IV SOLN
10.0000 mg | Freq: Once | INTRAVENOUS | Status: AC
  Administered 2020-06-02: 10 mg via INTRAVENOUS
  Filled 2020-06-02: qty 10

## 2020-06-02 MED ORDER — OCTREOTIDE ACETATE 30 MG IM KIT
PACK | INTRAMUSCULAR | Status: AC
  Filled 2020-06-02: qty 1

## 2020-06-02 MED ORDER — HEPARIN SOD (PORK) LOCK FLUSH 100 UNIT/ML IV SOLN
500.0000 [IU] | Freq: Once | INTRAVENOUS | Status: AC | PRN
  Administered 2020-06-02: 500 [IU]

## 2020-06-02 MED ORDER — SODIUM CHLORIDE 0.9 % IV SOLN
Freq: Once | INTRAVENOUS | Status: AC
  Administered 2020-06-02: 8 mg via INTRAVENOUS
  Filled 2020-06-02: qty 4

## 2020-06-02 MED ORDER — FAMOTIDINE IN NACL 20-0.9 MG/50ML-% IV SOLN
20.0000 mg | Freq: Once | INTRAVENOUS | Status: AC
  Administered 2020-06-02: 20 mg via INTRAVENOUS
  Filled 2020-06-02: qty 50

## 2020-06-02 MED ORDER — SODIUM CHLORIDE 0.9 % IV SOLN
64.0000 mg/m2 | Freq: Once | INTRAVENOUS | Status: AC
  Administered 2020-06-02: 156 mg via INTRAVENOUS
  Filled 2020-06-02: qty 26

## 2020-06-02 MED ORDER — DIPHENHYDRAMINE HCL 50 MG/ML IJ SOLN
50.0000 mg | Freq: Once | INTRAMUSCULAR | Status: AC
  Administered 2020-06-02: 50 mg via INTRAVENOUS
  Filled 2020-06-02: qty 1

## 2020-06-02 MED ORDER — SODIUM CHLORIDE 0.9% FLUSH
10.0000 mL | INTRAVENOUS | Status: DC | PRN
  Administered 2020-06-02 (×2): 10 mL

## 2020-06-02 MED ORDER — SODIUM CHLORIDE 0.9 % IV SOLN: Freq: Once | INTRAVENOUS | Status: AC

## 2020-06-02 NOTE — Patient Instructions (Signed)
[facility] Discharge Instructions for Patients Receiving Chemotherapy   Beginning January 23rd 2017 lab work for the Cancer Center will be done in the  Main lab at [place] on 1st floor. If you have a lab appointment with the Cancer Center please come in thru the  Main Entrance and check in at the main information desk   Today you received the following chemotherapy agents Taxol. Follow-up as scheduled  To help prevent nausea and vomiting after your treatment, we encourage you to take your nausea medication   If you develop nausea and vomiting, or diarrhea that is not controlled by your medication, call the clinic.  The clinic phone number is [phone]. Office hours are Monday-Friday 8:30am-5:00pm.  BELOW ARE SYMPTOMS THAT SHOULD BE REPORTED IMMEDIATELY:  *FEVER GREATER THAN 101.0 F  *CHILLS WITH OR WITHOUT FEVER  NAUSEA AND VOMITING THAT IS NOT CONTROLLED WITH YOUR NAUSEA MEDICATION  *UNUSUAL SHORTNESS OF BREATH  *UNUSUAL BRUISING OR BLEEDING  TENDERNESS IN MOUTH AND THROAT WITH OR WITHOUT PRESENCE OF ULCERS  *URINARY PROBLEMS  *BOWEL PROBLEMS  UNUSUAL RASH Items with * indicate a potential emergency and should be followed up as soon as possible. If you have an emergency after office hours please contact your primary care physician or go to the nearest emergency department.  Please call the clinic during office hours if you have any questions or concerns.   You may also contact the Patient Navigator at [phone] should you have any questions or need assistance in obtaining follow up care.      Resources For Cancer Patients and their Caregivers ? American Cancer Society: Can assist with transportation, wigs, general needs, runs Look Good Feel Better.        1-888-227-6333 ? Cancer Care: Provides financial assistance, online support groups, medication/co-pay assistance.  1-800-813-HOPE (4673) ? [facility] Assists  [place] cancer patients and their families through emotional , educational and financial support.  336-427-4357 ? [place] DSS Where to apply for food stamps, Medicaid and utility assistance. 336-342-1394 ? [facility]: Transportation to medical appointments. 336-347-2287 ? Social Security Administration: May apply for disability if have a Stage IV cancer. 336-342-7796 1-800-772-1213 ? [place] Aging, Disability and Transit Services: Assists with nutrition, care and transit needs. 336-349-2343         

## 2020-06-02 NOTE — Progress Notes (Signed)
Crystal Vega tolerated Taxol infusion well without complaints or incident. VSS upon discharge. Pt discharged via wheelchair in satisfactory condition

## 2020-06-03 MED ORDER — OCTREOTIDE ACETATE 30 MG IM KIT
PACK | INTRAMUSCULAR | Status: AC
  Filled 2020-06-03: qty 1

## 2020-06-13 ENCOUNTER — Ambulatory Visit (HOSPITAL_COMMUNITY): Payer: Medicare HMO

## 2020-06-16 ENCOUNTER — Ambulatory Visit (HOSPITAL_COMMUNITY): Payer: Medicare HMO

## 2020-06-16 ENCOUNTER — Ambulatory Visit (HOSPITAL_COMMUNITY): Payer: Medicare HMO | Admitting: Hematology

## 2020-06-16 ENCOUNTER — Other Ambulatory Visit (HOSPITAL_COMMUNITY): Payer: Medicare HMO

## 2020-06-18 ENCOUNTER — Ambulatory Visit (HOSPITAL_COMMUNITY)
Admission: RE | Admit: 2020-06-18 | Discharge: 2020-06-18 | Disposition: A | Discharge status: Home / Self Care | Payer: Medicare HMO | Source: Ambulatory Visit | Attending: Hematology | Admitting: Hematology

## 2020-06-18 ENCOUNTER — Other Ambulatory Visit: Payer: Self-pay

## 2020-06-18 DIAGNOSIS — Z170 Estrogen receptor positive status [ER+]: Secondary | ICD-10-CM | POA: Insufficient documentation

## 2020-06-18 DIAGNOSIS — C50812 Malignant neoplasm of overlapping sites of left female breast: Secondary | ICD-10-CM | POA: Insufficient documentation

## 2020-06-18 MED ORDER — IOHEXOL 300 MG/ML  SOLN
100.0000 mL | Freq: Once | INTRAMUSCULAR | Status: AC | PRN
  Administered 2020-06-18: 100 mL via INTRAVENOUS

## 2020-06-23 ENCOUNTER — Telehealth (HOSPITAL_COMMUNITY): Payer: Self-pay | Admitting: Pharmacy Technician

## 2020-06-23 ENCOUNTER — Inpatient Hospital Stay (HOSPITAL_COMMUNITY): Payer: Medicare HMO

## 2020-06-23 ENCOUNTER — Encounter (HOSPITAL_COMMUNITY): Payer: Self-pay | Admitting: Hematology

## 2020-06-23 ENCOUNTER — Other Ambulatory Visit (HOSPITAL_COMMUNITY): Payer: Self-pay | Admitting: Hematology

## 2020-06-23 ENCOUNTER — Other Ambulatory Visit (HOSPITAL_COMMUNITY): Payer: Self-pay

## 2020-06-23 ENCOUNTER — Inpatient Hospital Stay (HOSPITAL_COMMUNITY): Payer: Medicare HMO | Admitting: Hematology

## 2020-06-23 ENCOUNTER — Other Ambulatory Visit (HOSPITAL_COMMUNITY): Payer: Medicare HMO

## 2020-06-23 ENCOUNTER — Telehealth (HOSPITAL_COMMUNITY): Payer: Self-pay | Admitting: Pharmacist

## 2020-06-23 ENCOUNTER — Other Ambulatory Visit: Payer: Self-pay

## 2020-06-23 VITALS — BP 131/74 | HR 107 | Temp 96.9°F | Resp 20 | Wt 296.3 lb

## 2020-06-23 DIAGNOSIS — Z170 Estrogen receptor positive status [ER+]: Secondary | ICD-10-CM

## 2020-06-23 DIAGNOSIS — Z95828 Presence of other vascular implants and grafts: Secondary | ICD-10-CM

## 2020-06-23 DIAGNOSIS — C50812 Malignant neoplasm of overlapping sites of left female breast: Secondary | ICD-10-CM

## 2020-06-23 DIAGNOSIS — Z5111 Encounter for antineoplastic chemotherapy: Secondary | ICD-10-CM | POA: Diagnosis not present

## 2020-06-23 LAB — CBC WITH DIFFERENTIAL/PLATELET
Abs Immature Granulocytes: 0.06 10*3/uL (ref 0.00–0.07)
Basophils Absolute: 0.1 10*3/uL (ref 0.0–0.1)
Basophils Relative: 1 %
Eosinophils Absolute: 0.1 10*3/uL (ref 0.0–0.5)
Eosinophils Relative: 2 %
HCT: 30 % — ABNORMAL LOW (ref 36.0–46.0)
Hemoglobin: 9.2 g/dL — ABNORMAL LOW (ref 12.0–15.0)
Immature Granulocytes: 1 %
Lymphocytes Relative: 22 %
Lymphs Abs: 1.8 10*3/uL (ref 0.7–4.0)
MCH: 26.6 pg (ref 26.0–34.0)
MCHC: 30.7 g/dL (ref 30.0–36.0)
MCV: 86.7 fL (ref 80.0–100.0)
Monocytes Absolute: 0.9 10*3/uL (ref 0.1–1.0)
Monocytes Relative: 11 %
Neutro Abs: 5.2 10*3/uL (ref 1.7–7.7)
Neutrophils Relative %: 63 %
Platelets: 274 10*3/uL (ref 150–400)
RBC: 3.46 MIL/uL — ABNORMAL LOW (ref 3.87–5.11)
RDW: 17 % — ABNORMAL HIGH (ref 11.5–15.5)
WBC: 8.2 10*3/uL (ref 4.0–10.5)
nRBC: 0 % (ref 0.0–0.2)

## 2020-06-23 LAB — COMPREHENSIVE METABOLIC PANEL
ALT: 15 U/L (ref 0–44)
AST: 28 U/L (ref 15–41)
Albumin: 3.3 g/dL — ABNORMAL LOW (ref 3.5–5.0)
Alkaline Phosphatase: 64 U/L (ref 38–126)
Anion gap: 7 (ref 5–15)
BUN: 16 mg/dL (ref 8–23)
CO2: 25 mmol/L (ref 22–32)
Calcium: 8.6 mg/dL — ABNORMAL LOW (ref 8.9–10.3)
Chloride: 102 mmol/L (ref 98–111)
Creatinine, Ser: 0.79 mg/dL (ref 0.44–1.00)
GFR, Estimated: >60 mL/min (ref >60)
Glucose, Bld: 126 mg/dL — ABNORMAL HIGH (ref 70–99)
Potassium: 3.5 mmol/L (ref 3.5–5.1)
Sodium: 134 mmol/L — ABNORMAL LOW (ref 135–145)
Total Bilirubin: 0.3 mg/dL (ref 0.3–1.2)
Total Protein: 6.9 g/dL (ref 6.5–8.1)

## 2020-06-23 MED ORDER — DIPHENHYDRAMINE HCL 25 MG PO CAPS
ORAL_CAPSULE | ORAL | Status: AC
  Filled 2020-06-23: qty 2

## 2020-06-23 MED ORDER — GABAPENTIN 300 MG PO CAPS: 300.0000 mg | ORAL_CAPSULE | Freq: Every day | ORAL | 1 refills | Status: DC

## 2020-06-23 MED ORDER — OCTREOTIDE ACETATE 30 MG IM KIT
PACK | INTRAMUSCULAR | Status: AC
  Filled 2020-06-23: qty 1

## 2020-06-23 MED ORDER — CAPECITABINE 500 MG PO TABS: 1000.0000 mg/m2 | ORAL_TABLET | Freq: Two times a day (BID) | ORAL | 0 refills | Status: DC

## 2020-06-23 MED ORDER — ACETAMINOPHEN 325 MG PO TABS
ORAL_TABLET | ORAL | Status: AC
  Filled 2020-06-23: qty 2

## 2020-06-23 MED FILL — Dexamethasone Sodium Phosphate Inj 100 MG/10ML: INTRAMUSCULAR | Qty: 1 | Status: AC

## 2020-06-23 NOTE — Progress Notes (Signed)
DISCONTINUE ON PATHWAY REGIMEN - Breast     A cycle is every 28 days (3 weeks on and 1 week off):     Paclitaxel   **Always confirm dose/schedule in your pharmacy ordering system**  REASON: Toxicities / Adverse Event PRIOR TREATMENT: [id number]: Paclitaxel 80 mg/m2 D1, 8, 15 q28 Days TREATMENT RESPONSE: Stable Disease (SD)  START ON PATHWAY REGIMEN - Breast     A cycle is every 21 days:     Capecitabine   **Always confirm dose/schedule in your pharmacy ordering system**  Patient Characteristics: Distant Metastases or Locoregional Recurrent Disease - Unresected or Locally Advanced Unresectable Disease Progressing after Neoadjuvant and Local Therapies, HER2 Negative/Unknown/Equivocal, ER Positive, Chemotherapy, Second Line Therapeutic Status: Distant Metastases ER Status: Positive (+) HER2 Status: Negative (-) PR Status: Negative (-) Therapy Approach Indicated: Standard Chemotherapy/Endocrine Therapy Line of Therapy: Second Line Intent of Therapy: Non-Curative / Palliative Intent, Discussed with Patient

## 2020-06-23 NOTE — Progress Notes (Signed)
Per Dr. Delton Coombes, we are discontinuing Taxol due to worsening neuropathy and nail changes. Pt is to be starting on Xeloda. Port flushed and deaccessed. Pt discharged in satisfactory condition with follow up instructions.

## 2020-06-23 NOTE — Patient Instructions (Signed)
[facility] at [facility] Discharge Instructions  Labs drawn from portacath today   Thank you for choosing [facility] at [facility] to provide your oncology and hematology care.  To afford each patient quality time with our provider, please arrive at least 15 minutes before your scheduled appointment time.   If you have a lab appointment with the Cancer Center please come in thru the Main Entrance and check in at the main information desk.  You need to re-schedule your appointment should you arrive 10 or more minutes late.  We strive to give you quality time with our providers, and arriving late affects you and other patients whose appointments are after yours.  Also, if you no show three or more times for appointments you may be dismissed from the clinic at the providers discretion.     Again, thank you for choosing [facility].  Our hope is that these requests will decrease the amount of time that you wait before being seen by our physicians.       _____________________________________________________________  Should you have questions after your visit to  Cancer Center, please contact our office at [phone] and follow the prompts.  Our office hours are 8:00 a.m. and 4:30 p.m. Monday - Friday.  Please note that voicemails left after 4:00 p.m. may not be returned until the following business day.  We are closed weekends and major holidays.  You do have access to a nurse 24-7, just call the main number to the clinic [phone] and do not press any options, hold on the line and a nurse will answer the phone.    For prescription refill requests, have your pharmacy contact our office and allow 72 hours.    Due to Covid, you will need to wear a mask upon entering the hospital. If you do not have a mask, a mask will be given to you at the Main Entrance upon arrival. For doctor visits, patients may have 1 support person age 18  or older with them. For treatment visits, patients can not have anyone with them due to social distancing guidelines and our immunocompromised population.     

## 2020-06-23 NOTE — Progress Notes (Signed)
Crystal Vega, [place] [id number]   CLINIC:  Medical Oncology/Hematology  PCP:  Lemmie Evens, MD [address] / [place] [address] [phone]   REASON FOR VISIT:  Follow-up for left breast cancer  PRIOR THERAPY: None  NGS Results: ER positive, PR/HER-2 negative  CURRENT THERAPY: Paclitaxel 3 weeks on, 1 week off  BRIEF ONCOLOGIC HISTORY:  Oncology History  Malignant neoplasm of overlapping sites of left breast in female, estrogen receptor positive ([place])  11/16/2019 Cancer Staging   Staging form: Breast, AJCC 8th Edition - Clinical stage from 11/16/2019: Stage IV (cT4b, cN1, cM1, G3, ER+, PR-, HER2-) - Signed by [name], MD on 12/17/2019   11/28/2019 Initial Diagnosis   Patient noted intermittent left breast pain and swelling for several months. Mammogram showed a 6.2cm mass in the left breast axillary tail with an adjacent 0.9cm mass, 2 abnormal left axillary lymph nodes, 1 abnormal right axillary lymph node, and diffuse left breast edema and skin thickening. Biopsy showed IDC, grade 3 in the left breast and bilateral axillas, HER-2 negative (1+), ER+ 100%, PR- 0%, Ki67 30%.    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 female, returns for routine follow-up and consideration for next cycle of chemotherapy. Aryam was last seen on 05/26/2020.  Due for cycle #7 of paclitaxel today.   Overall, she tells me she has been feeling pretty well. She reports feeling heaviness in her left arm and soreness in her fingertips with throbbing under her fingernails at night. She also reports having discoloration in her finger nails, with the worse discoloration in the 3rd and 4th fingers of her right hand, more than the left hand. She reports that her left hand is becoming weaker and unable to write with it, more than the right hand. She reports feeling tenderness in her toes, but denies nausea. She denies having hematuria, melena or hematochezia, though reports seeing blood when she blows her nose. She denies having any cardiac issues.  She denies having a prior diagnosis of breast cancer or family history of breast cancer.  She will stop chemo and start Xeloda.    REVIEW OF SYSTEMS:  Review of Systems  Constitutional: Positive for fatigue (25%). Negative for appetite change.  HENT:   Positive for nosebleeds (when blowing nose).   Respiratory: Positive for shortness of breath (w/ activity).   Cardiovascular: Positive for leg swelling (in feet).  Gastrointestinal: Negative for blood in stool.  Genitourinary: Negative for hematuria.   Musculoskeletal: Positive  for myalgias (L arm heaviness; weakness in L hand; tenderness in fingertips & toes).  Skin: Positive for itching (face & chest).  Neurological: Positive for numbness (L arm).  Psychiatric/Behavioral: Positive for depression and sleep disturbance.  All other systems reviewed and are negative.   PAST MEDICAL/SURGICAL HISTORY:   Past Medical History:  Diagnosis Date  . Anemia   . Arthritis    per patient " left knee"  . Essential hypertension, benign   . Family history of cancer of female genital organ   . Family history of GI tract cancer   . Metastatic breast cancer ([place])    left breast  . Port-A-Cath in place 12/25/2019  . Type 2 diabetes mellitus ([place])    Past Surgical History:  Procedure Laterality Date  . CATARACT EXTRACTION W/ INTRAOCULAR LENS IMPLANT Right   . HYSTEROSCOPY WITH D & C N/A 06/05/2014   Procedure: DILATATION AND CURETTAGE /HYSTEROSCOPY;  Surgeon: Florian Buff, MD;  Location: [facility];  Service: Gynecology;  Laterality: N/A;  . POLYPECTOMY N/A 06/05/2014   Procedure: ENDOMETRIAL POLYPECTOMY;  Surgeon: Florian Buff, MD;  Location: [facility];  Service: Gynecology;  Laterality: N/A;  . PORTACATH PLACEMENT N/A 12/14/2019   Procedure: INSERTION PORT-A-CATH WITH ULTRASOUND GUIDANCE;  Surgeon: Donnie Mesa, MD;  Location: [place];  Service: General;  Laterality: N/A;  . TUBAL LIGATION      SOCIAL HISTORY:  Social History   Socioeconomic History  . Marital status: Married    Spouse name: Herbie Baltimore  . Number of children: 2  . Years of education: Not on file  . Highest education level: Not on file  Occupational History  . Occupation: retired  Tobacco Use  . Smoking status: Former Smoker    Types: Cigarettes  . Smokeless tobacco: Never Used  . Tobacco comment: quit about 40+ years ago  Vaping Use  . Vaping Use: Never used  Substance and Sexual Activity  . Alcohol use: Never  . Drug use: No  . Sexual activity: Not on file  Other Topics Concern  . Not on file  Social History Narrative  . Not on file   Social Determinants of Health   Financial Resource Strain: Low Risk   . Difficulty of Paying Living Expenses: Not hard at all  Food Insecurity: No Food Insecurity  . Worried About Charity fundraiser in the Last Year: Never true  . Ran Out of Food in the Last Year: Never true   Transportation Needs: No Transportation Needs  . Lack of Transportation (Medical): No  . Lack of Transportation (Non-Medical): No  Physical Activity: Inactive  . Days of Exercise per Week: 0 days  . Minutes of Exercise per Session: 0 min  Stress: No Stress Concern Present  . Feeling of Stress : Only a little  Social Connections: Moderately Integrated  . Frequency of Communication with Friends and Family: Three times a week  . Frequency of Social Gatherings with Friends and Family: Three times a week  . Attends Religious Services: 1 to 4 times per year  . Active Member of Clubs or Organizations: No  . Attends Archivist Meetings: Never  . Marital Status: Married  Human resources officer Violence: Not At Risk  . Fear of Current or Ex-Partner: No  . Emotionally Abused: No  . Physically Abused: No  . Sexually Abused: No    FAMILY HISTORY:  Family History  Problem Relation Age of Onset  . Diabetes Mellitus II Father   . Congestive  Heart Failure Father   . Cancer Mother        Deceased at age 8; patient not sure if cancer was in her stomach or colon  . Stroke Maternal Grandmother   . Cancer Cousin        female reproductive cancer, dx. 50s/60s    CURRENT MEDICATIONS:  Current Outpatient Medications  Medication Sig Dispense Refill  . capecitabine (XELODA) 500 MG tablet Take 5 tablets (2,500 mg total) by mouth 2 (two) times daily after a meal. 140 tablet 0  . apixaban (ELIQUIS) 5 MG TABS tablet Take 1 tablet (5 mg total) by mouth 2 (two) times daily. 60 tablet 3  . ferrous sulfate 325 (65 FE) MG tablet Take 325 mg by mouth daily with breakfast.    . hydrochlorothiazide (HYDRODIURIL) 25 MG tablet Take 25 mg by mouth daily.    Marland Kitchen HYDROcodone-acetaminophen (NORCO/VICODIN) 5-325 MG tablet Take 1 tablet by mouth every 6 (six) hours as needed for moderate pain. 15 tablet 0  . HYDROmorphone (DILAUDID) 2 MG tablet Take 1 tablet (2 mg total) by mouth every 4 (four) hours as needed for  severe pain. 30 tablet 0  . ibuprofen (ADVIL,MOTRIN) 800 MG tablet Take 800 mg by mouth every 8 (eight) hours as needed for moderate pain.     Marland Kitchen losartan (COZAAR) 50 MG tablet Take 50 mg by mouth daily.    . metFORMIN (GLUCOPHAGE) 500 MG tablet Take 500 mg by mouth daily with breakfast.     . oxybutynin (DITROPAN) 5 MG tablet Take 5 mg by mouth daily.     Marland Kitchen PACLitaxel (TAXOL IV) Inject 80 mg/m2 into the vein once a week. Day 1, 8, 15 every 28 days    . pantoprazole (PROTONIX) 40 MG tablet Take 40 mg by mouth daily.    . potassium chloride (MICRO-K) 10 MEQ CR capsule Take 10 mEq by mouth daily.     . pravastatin (PRAVACHOL) 10 MG tablet Take 10 mg by mouth daily.      No current facility-administered medications for this visit.    ALLERGIES:  No Known Allergies  PHYSICAL EXAM:  Performance status (ECOG): 1 - Symptomatic but completely ambulatory  Vitals:   06/23/20 0851  BP: 131/74  Pulse: (!) 107  Resp: 20  Temp: (!) 96.9 F (36.1 C)  SpO2: 99%   Wt Readings from Last 3 Encounters:  06/23/20 296 lb 4.8 oz (134.4 kg)  06/02/20 291 lb 6.4 oz (132.2 kg)  05/26/20 290 lb (131.5 kg)   Physical Exam Vitals reviewed.  Constitutional:      Appearance: Normal appearance. She is obese.  Cardiovascular:     Rate and Rhythm: Normal rate and regular rhythm.     Pulses: Normal pulses.     Heart sounds: Normal heart sounds.  Pulmonary:     Effort: Pulmonary effort is normal.     Breath sounds: Normal breath sounds.  Chest:  Breasts:     Right: No supraclavicular adenopathy.     Left: Supraclavicular adenopathy present.      Comments: Port-a-Cath in R chest Musculoskeletal:     Right hand: Tenderness (fingertips) present.     Left hand: Tenderness (fingertips) present.     Right lower leg: No edema.     Left lower leg: No edema.     Comments: Discoloration in fingernails, worse in 3rd & 4th nails of R hand  Lymphadenopathy:     Upper Body:     Right upper body: No  supraclavicular adenopathy.     Left upper body: Supraclavicular adenopathy present.  Neurological:     General: No focal deficit present.     Mental Status: She is alert and oriented to person, place, and time.  Psychiatric:        Mood and Affect: Mood normal.        Behavior: Behavior normal.     LABORATORY DATA:  I have reviewed the labs as listed.  CBC Latest Ref Rng & Units 06/23/2020 06/02/2020 05/26/2020  WBC 4.0 - 10.5 K/uL 8.2 3.8(L) 5.2  Hemoglobin 12.0 - 15.0 g/dL 9.2(L) 9.1(L) 9.5(L)  Hematocrit 36.0 - 46.0 % 30.0(L) 29.2(L) 29.7(L)  Platelets 150 - 400 K/uL 274 283 268   CMP Latest Ref Rng & Units 06/23/2020 06/02/2020 05/26/2020  Glucose 70 - 99 mg/dL 126(H) 191(H) 189(H)  BUN 8 - 23 mg/dL 16 18 20   Creatinine 0.44 - 1.00 mg/dL 0.79 0.74 0.75  Sodium 135 - 145 mmol/L 134(L) 137 134(L)  Potassium 3.5 - 5.1 mmol/L 3.5 3.6 3.7  Chloride 98 - 111 mmol/L 102 103 102  CO2 22 - 32 mmol/L 25 25 25   Calcium 8.9 - 10.3 mg/dL 8.6(L) 8.7(L) 8.8(L)  Total Protein 6.5 - 8.1 g/dL 6.9 6.9 6.9  Total Bilirubin 0.3 - 1.2 mg/dL 0.3 0.3 0.4  Alkaline Phos 38 - 126 U/L 64 67 71  AST 15 - 41 U/L 28 24 23   ALT 0 - 44 U/L 15 16 18    Lab Results  Component Value Date   CA2729 33.0 05/20/2020    DIAGNOSTIC IMAGING:  I have independently reviewed the scans and discussed with the patient. CT CHEST ABDOMEN PELVIS W CONTRAST  Result Date: 06/18/2020 CLINICAL DATA:  Metastatic left breast cancer to the liver mediastinal lymph nodes. Patient currently undergoing chemotherapy. EXAM: CT CHEST, ABDOMEN, AND PELVIS WITH CONTRAST TECHNIQUE: Multidetector CT imaging of the chest, abdomen and pelvis was performed following the standard protocol during bolus administration of intravenous contrast. CONTRAST:  166m OMNIPAQUE IOHEXOL 300 MG/ML  SOLN COMPARISON:  Multiple priors including CT chest abdomen and pelvis March 20, 2020 and December 29, 2019. FINDINGS: CT CHEST FINDINGS Cardiovascular: Right chest  wall port with tip in the right atrium. Normal size heart. Scattered aortic atherosclerosis. No central pulmonary embolus. Mediastinum/Nodes: Similar size of the index and non-index left axillary, subpectoral, supraclavicular,and lower jugular lymphadenopathy. No Crystal or increased sites of lymphadenopathy identified. Reference lymph nodes are as follows: -Largest conglomerate area in the left axilla measures 5.8 x 4.8 age cm previously 5.6 x 5.0 cm (series 2, image 1 5) -Largest residual lymph node in left lower jugular region measures 1.8 cm, unchanged (series 2, image 6). Lungs/Pleura: Lungs are clear. No pleural effusion or pneumothorax. Musculoskeletal: Similar left breast cutaneous skin thickening. Similar appearance of lucent lesion in the left sternal manubrium and T4 vertebral body. No Crystal suspicious osseous lesions. CT ABDOMEN PELVIS FINDINGS Hepatobiliary: Again seen are several small poorly defined low-attenuation hepatic lesions which are difficult to accurately measure given the ill-defined margins and showed no significant change in comparison the prior study. Gallbladder is unremarkable. No evidence of biliary ductal dilatation. Pancreas: Unremarkable. No pancreatic ductal dilatation or surrounding inflammatory changes. Spleen: Normal in size without focal abnormality. Adrenals/Urinary Tract: Adrenal glands are unremarkable. Similar appearance of the several small bilateral renal cysts. No renal calculi, solid enhancing lesion, or hydronephrosis. Bladder is unremarkable. Stomach/Bowel: There is persistent short segment ascending colonic wall thickening just proximal to the hepatic flexure  on image 59 of series 2, also with similar tiny subcentimeter adjacent pericolonic lymph nodes again seen. Stomach appears normal. No evidence of small bowel thickening or obstruction. Vascular/Lymphatic: Aortic atherosclerosis. No pathologically enlarged abdominal or pelvic lymph nodes. Reproductive: Leiomyomatous  uterus.  No adnexal masses. Other: No abdominopelvic ascites or pneumoperitoneum. Musculoskeletal: Multilevel degenerative changes spine. No suspicious osseous lesions. Remote left hip fracture deformity and osteoarthritis again visualized. IMPRESSION: 1. No Crystal or progressive metastatic disease identified the chest, abdomen, or pelvis. 2. Similar size of the index and non-index left axillary, subpectoral, supraclavicular,and lower jugular lymphadenopathy. No Crystal or increased sites of lymphadenopathy identified. 3. Similar appearance of several small poorly defined low-attenuation hepatic lesions. 4. No significant change in the lucent manubrial and T4 vertebral body lesions. 5. Unchanged short segment ascending colonic wall thickening just proximal to the hepatic flexure with similar tiny adjacent pericolonic lymph nodes, primary colon carcinoma cannot be excluded. Again consider further evaluation with colonoscopy if not previously performed. 6. Aortic atherosclerosis. Aortic Atherosclerosis (ICD10-I70.0). Electronically Signed   By: Dahlia Bailiff MD   On: 06/18/2020 14:09     ASSESSMENT:  1. 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 -MRI of the breast on 12/10/2019 shows left breast malignancy involving all 4 quadrants and skin of the left breast including 0.5 cm biopsy-proven malignancy within the far posterior outer right breast, abnormal highly suspicious nonmuscle like enhancement within all 4 quadrants of the left breast and diffuse skin thickening. This abnormal non-mass-like enhancement and skin thickening extends across the midline to the medial right breast. At least 7 abnormal left axillary lymph nodes and 1 abnormal right axillary lymph node compatible with metastatic disease. -CT CAP  on 12/12/2019 shows bulky lymphadenopathy in the left subpectoral and axillary regions with largest lymph node measuring 3.3 cm. Mild mediastinal adenopathy in the right paratracheal prevascular and lateral aortic regions, largest lymph node in the lateral aortic region measuring 2.4 cm. Lymphadenopathy in the inferior jugular and supraclavicular regions, left side greater than right. Mild lymphadenopathy in the right axilla. -Multiple small hypervascular lesions seen in the right and left lobesconsistent with liver metastasis. -Bone scan on 12/12/2019 did not show any bone metastasis. -CT CAP on 12/29/2019 showed possible lytic lesion in the T4 vertebral body. -Liver biopsy on 12/28/2019 consistent with metastatic breast cancer. -Paclitaxel weekly 3 weeks on 1 week off started on 12/31/2019. -CT CAP on 03/20/2020 shows mild decrease in the left axillary, subpectoral, supraclavicular, mediastinal and lower jugular lymphadenopathy. Stable small low-attenuation liver lesion. No Crystal progressive metastatic disease. Stable lesion in the left sternal manubrium and T4 vertebral body. Stable short segment concentric wall thickening of the right colon with subcentimeter pericolonic lymph nodes. We will monitor this finding as she has metastatic breast cancer. She is asymptomatic. -CT CAP on 06/18/1998 6:22 cycles of chemotherapy showed no Crystal or progressive metastatic disease. Similar size of the index and nonindex left axillary subpectoral, supraclavicular and lower jugular lymphadenopathy.  2. Right colonic mass: -CT scan showed constricting mass involving the ascending colon measuring 3.6 x 3.5 cm with a small 1 cm right pericolonic lymph node. -She never had a colonoscopy. Denies any bleeding per rectum or melena.  3. Left leg DVT: -Doppler on 03/03/2020 shows left femoral vein, popliteal vein and posterior tibial vein deep vein thrombosis.  PLAN:  1. Metastatic left breast cancerto the  liver: -She has numbness in the fingertips and occasionally pain under the nails. She also has difficulty opening more. -I have reviewed scan results with the patient. Overall stable disease. -Because of neuropathy and tolerability issues I will discontinue paclitaxel. -Options include capecitabine and anthracycline. -Patient does report tightness in her left upper extremity with worsening power of the left hand. Hence I think she will likely benefit from chemotherapy rather than CDK 4/6 inhibitor and aromatase inhibitor. -I have recommended starting her on Xeloda (thousand MGs/M square) twice daily, 2 weeks on 1 week off. We discussed side effects including but not limited to hand-foot skin reaction, diarrhea, cytopenias, nausea among others. -She will likely start pills in 1 week and see [place] back in 2 weeks for follow-up.  2. Right colonic mass: -She does not report any bleeding per rectum or melena. Previous colonoscopy was canceled. -We will make a referral to Dr. Orvan Falconer office for rescheduling of colonoscopy.  3. Peripheral neuropathy: -She is complaining of pain in the fingertips at nighttime. -We will start her on gabapentin 300 mg at bedtime. Will increase it to 600 mg if needed.  4.Left leg DVT: -Continue Eliquis twice daily. No bleeding issues.   Orders placed this encounter:  Orders Placed This Encounter  Procedures  . CBC with Differential/Platelet  . Comprehensive metabolic panel  . Magnesium   Total time spent is 40 minutes with more than 50% of the time spent face-to-face discussing scan results, treatment options, side effects, counseling and coordination of care.  [name], MD [place] [phone]   I, Milinda Antis, am acting as a scribe for Dr. Sanda Linger.  I, [name] MD, have reviewed the above documentation for accuracy and completeness, and I agree with the above.

## 2020-06-23 NOTE — Patient Instructions (Signed)
[place] at [facility] Discharge Instructions  You were seen today by Dr. Delton Coombes. He went over your recent results and scans. You will discontinue chemo and be prescribed Xeloda to take for 2 weeks on with 1 week off your breast cancer; start by taking 4 tablets in the morning and 4 tablets in the evening. Take your nausea medication, Compazine, in the morning 30 minutes before taking Xeloda. If you develop watery diarrhea, take Imodium, 2 tablets after the first watery diarrhea and 1 tablet with every subsequent watery bowel movement. You will be sent back to Dr. Gala Romney to have a colonoscopy to determine the state of your colon lesion. You will be prescribed gabapentin 300 mg to take at bedtime for your finger neuropathy. Dr. Delton Coombes will see you back in 2 weeks for labs and follow up.   Thank you for choosing [facility] to provide your oncology and hematology care.  To afford each patient quality time with our provider, please arrive at least 15 minutes before your scheduled appointment time.   If you have a lab appointment with the [place] please come in thru the Main Entrance and check in at the main information desk  You need to re-schedule your appointment should you arrive 10 or more minutes late.  We strive to give you quality time with our providers, and arriving late affects you and other patients whose appointments are after yours.  Also, if you no show three or more times for appointments you may be dismissed from the clinic at the providers discretion.     Again, thank you for choosing [facility]  Our hope is that these requests will decrease the amount of time that you wait before being seen by our physicians.       _____________________________________________________________  Should you have questions after your visit to [facility], please contact our office at [phone] between  the hours of 8:00 a.m. and 4:30 p.m.  Voicemails left after 4:00 p.m. will not be returned until the following business day.  For prescription refill requests, have your pharmacy contact our office and allow 72 hours.    Cancer Center Support Programs:   > Cancer Support Group  2nd Tuesday of the month 1pm-2pm, Journey Room

## 2020-06-23 NOTE — Progress Notes (Signed)
ON PATHWAY REGIMEN - Breast  No Change  Continue With Treatment as Ordered.  Original Decision Date/Time: 12/17/2019 09:18     A cycle is every 28 days (3 weeks on and 1 week off):     Paclitaxel   **Always confirm dose/schedule in your pharmacy ordering system**  Patient Characteristics: Distant Metastases or Locoregional Recurrent Disease - Unresected or Locally Advanced Unresectable Disease Progressing after Neoadjuvant and Local Therapies, HER2 Negative/Unknown/Equivocal, ER Positive, Chemotherapy, First Line Therapeutic Status: Distant Metastases ER Status: Positive (+) HER2 Status: Negative (-) PR Status: Negative (-) Therapy Approach Indicated: Standard Chemotherapy/Endocrine Therapy Line of Therapy: First Line Intent of Therapy: Non-Curative / Palliative Intent, Discussed with Patient

## 2020-06-23 NOTE — Progress Notes (Signed)
Patient assessed and labs reviewed by Dr. Delton Coombes. Changing treatment to Xeloda due to neuropathy and nail change. Primary RN and pharmacy aware.

## 2020-06-23 NOTE — Telephone Encounter (Signed)
Oral Oncology Patient Advocate Encounter  Received notification from [facility] that prior authorization for Xeloda (Capecitabine) is required.  PA could not be submitted on CoverMyMeds  Called [phone] to complete PA over the phone.  Rep on phone stated that the PA would be directed to a pharmacist to review and we would have a decision within 72 hours.  Oral Oncology Clinic will continue to follow.  [place] Patient [place] Phone [phone] Fax [phone] 06/23/2020 1:58 PM

## 2020-06-23 NOTE — Telephone Encounter (Signed)
Oral Oncology Pharmacist Encounter  Received new prescription for Xeloda (capecitabine) for the treatment of stage IV breast cancer, ER+, PR-, HER2-, planned duration until disease progression or unacceptable drug toxicity. Patient previously treated with paclitaxel, transitioning to treatment with Xeloda due to neuropathy and nail change with paclitaxel.  CMP from 06/23/20 assessed, no relevant lab abnormalities. Prescription dose and frequency assessed.   Current medication list in Epic reviewed, one DDIs with capecitabine identified: - Pantoprazole: Proton Pump Inhibitors (PPI) may diminish the therapeutic effect of capecitabine, varying information on the clinical impact. Recommend evaluating the need for a PPI/acid suppression. If acid suppression is needed, attempt switching to a H2 antagonist (eg, famotidine) if possible.  Evaluated chart and no patient barriers to medication adherence identified.   Prescription has been e-scribed to the [facility] for benefits analysis and approval.  Oral Oncology Clinic will continue to follow for insurance authorization, copayment issues, initial counseling and start date.  Darl Pikes, PharmD, BCPS, BCOP, CPP Hematology/Oncology Clinical Pharmacist Practitioner [facility]/HP/AP [place] [facility] [phone]  06/23/2020 11:55 AM

## 2020-06-24 ENCOUNTER — Telehealth (HOSPITAL_COMMUNITY): Payer: Self-pay | Admitting: Pharmacy Technician

## 2020-06-24 ENCOUNTER — Encounter: Payer: Self-pay | Admitting: Internal Medicine

## 2020-06-24 LAB — CANCER ANTIGEN 15-3: CA 15-3: 35.3 U/mL — ABNORMAL HIGH (ref 0.0–25.0)

## 2020-06-24 LAB — CANCER ANTIGEN 27.29: CA 27.29: 31.6 U/mL (ref 0.0–38.6)

## 2020-06-24 NOTE — Telephone Encounter (Signed)
Oral Oncology Patient Advocate Encounter  Called and spoke to patient about getting assistance for her Xeloda.  With patients permission, I submitted an application on her behalf to [facility] in an effort to reduce patient's out of pocket expense for Xeloda to $0.    Pending signed prescriber form.  Once received I will fax both parts to [place].  This encounter will be updated until final determination.   [place] Patient [place] Phone [phone] Fax [phone] 06/24/2020 3:25 PM

## 2020-06-24 NOTE — Telephone Encounter (Signed)
Oral Oncology Patient Advocate Encounter  Prior Authorization for Xeloda has been approved.    PA# [id number] Effective dates: 06/23/20 through 06/23/21  Patients co-pay is $612.92.  Oral Oncology Clinic will continue to follow.   [place] Patient [place] Phone [phone] Fax [phone] 06/24/2020 11:21 AM

## 2020-06-25 NOTE — Telephone Encounter (Signed)
Faxed completed application to [place] on 06/24/20.  Fax number [phone].  [place] phone number for follow up is [phone].  [place] Patient [place] Phone [phone] Fax [phone] 06/25/2020 9:00 AM

## 2020-06-30 ENCOUNTER — Ambulatory Visit (HOSPITAL_COMMUNITY): Payer: Medicare HMO

## 2020-06-30 ENCOUNTER — Other Ambulatory Visit (HOSPITAL_COMMUNITY): Payer: Medicare HMO

## 2020-06-30 NOTE — Telephone Encounter (Signed)
Called to check the status of application.  Rep states that application is still being processed.  She put in a message to mark the application as Urgent so that we have a determination in 24-48 hours.  [place] Patient [place] Phone [phone] Fax [phone] 06/30/2020 9:08 AM

## 2020-07-02 NOTE — Telephone Encounter (Signed)
Called to follow up on application status this morning.  [employer] rep stated that application was still pending a benefits investigation and once complete they would make a determination.  Rep stated that I can call to check status again at the end of the week.    I called Mrs Offenberger to let her know her application was still pending and that I would call back on Friday if I had not received a determination.  She verbalized understanding.  Crystal V[place] Phone [phone] Fax [phone] 07/02/2020 9:33 AM

## 2020-07-07 ENCOUNTER — Other Ambulatory Visit (HOSPITAL_COMMUNITY): Payer: Self-pay

## 2020-07-07 ENCOUNTER — Ambulatory Visit (HOSPITAL_COMMUNITY): Payer: Medicare HMO

## 2020-07-07 NOTE — Telephone Encounter (Signed)
Oral Oncology Patient Advocate Encounter  Received notification from [facility] Patient Assistance program that patient has been successfully enrolled into their program to receive Xeloda from the manufacturer at $0 out of pocket until: therapy is discontinued, health insurance or financial status changes, or the patient no longer meets the eligibility requirements.    I will call and speak to Crystal Vega to let her know she has been approved and to expect a phone call.  Specialty Pharmacy that will dispense medication is [facility].  Patient knows to call the office with questions or concerns.   Oral Oncology Clinic will continue to follow.  [place] Patient [place] Phone [phone] Fax [phone] 07/07/2020 8:36 AM

## 2020-07-09 ENCOUNTER — Inpatient Hospital Stay (HOSPITAL_COMMUNITY): Payer: Medicare HMO | Attending: Hematology | Admitting: Hematology

## 2020-07-09 ENCOUNTER — Inpatient Hospital Stay (HOSPITAL_COMMUNITY): Payer: Medicare HMO

## 2020-07-09 ENCOUNTER — Other Ambulatory Visit: Payer: Self-pay

## 2020-07-09 VITALS — BP 105/62 | HR 102 | Temp 98.4°F | Resp 18 | Wt 293.0 lb

## 2020-07-09 DIAGNOSIS — Z7901 Long term (current) use of anticoagulants: Secondary | ICD-10-CM | POA: Insufficient documentation

## 2020-07-09 DIAGNOSIS — R058 Other specified cough: Secondary | ICD-10-CM | POA: Insufficient documentation

## 2020-07-09 DIAGNOSIS — C50812 Malignant neoplasm of overlapping sites of left female breast: Secondary | ICD-10-CM | POA: Insufficient documentation

## 2020-07-09 DIAGNOSIS — G620 Drug-induced polyneuropathy: Secondary | ICD-10-CM | POA: Insufficient documentation

## 2020-07-09 DIAGNOSIS — R059 Cough, unspecified: Secondary | ICD-10-CM | POA: Insufficient documentation

## 2020-07-09 DIAGNOSIS — Z7984 Long term (current) use of oral hypoglycemic drugs: Secondary | ICD-10-CM | POA: Insufficient documentation

## 2020-07-09 DIAGNOSIS — R319 Hematuria, unspecified: Secondary | ICD-10-CM | POA: Insufficient documentation

## 2020-07-09 DIAGNOSIS — Z170 Estrogen receptor positive status [ER+]: Secondary | ICD-10-CM | POA: Diagnosis not present

## 2020-07-09 DIAGNOSIS — Z86718 Personal history of other venous thrombosis and embolism: Secondary | ICD-10-CM | POA: Insufficient documentation

## 2020-07-09 DIAGNOSIS — C787 Secondary malignant neoplasm of liver and intrahepatic bile duct: Secondary | ICD-10-CM | POA: Insufficient documentation

## 2020-07-09 DIAGNOSIS — R490 Dysphonia: Secondary | ICD-10-CM | POA: Diagnosis not present

## 2020-07-09 DIAGNOSIS — C50611 Malignant neoplasm of axillary tail of right female breast: Secondary | ICD-10-CM | POA: Insufficient documentation

## 2020-07-09 DIAGNOSIS — Z79899 Other long term (current) drug therapy: Secondary | ICD-10-CM | POA: Diagnosis not present

## 2020-07-09 LAB — CBC WITH DIFFERENTIAL/PLATELET
Abs Immature Granulocytes: 0.03 10*3/uL (ref 0.00–0.07)
Basophils Absolute: 0.1 10*3/uL (ref 0.0–0.1)
Basophils Relative: 1 %
Eosinophils Absolute: 0.3 10*3/uL (ref 0.0–0.5)
Eosinophils Relative: 4 %
HCT: 32.8 % — ABNORMAL LOW (ref 36.0–46.0)
Hemoglobin: 9.6 g/dL — ABNORMAL LOW (ref 12.0–15.0)
Immature Granulocytes: 1 %
Lymphocytes Relative: 26 %
Lymphs Abs: 1.7 10*3/uL (ref 0.7–4.0)
MCH: 26.4 pg (ref 26.0–34.0)
MCHC: 29.3 g/dL — ABNORMAL LOW (ref 30.0–36.0)
MCV: 90.4 fL (ref 80.0–100.0)
Monocytes Absolute: 0.6 10*3/uL (ref 0.1–1.0)
Monocytes Relative: 9 %
Neutro Abs: 4 10*3/uL (ref 1.7–7.7)
Neutrophils Relative %: 59 %
Platelets: 292 10*3/uL (ref 150–400)
RBC: 3.63 MIL/uL — ABNORMAL LOW (ref 3.87–5.11)
RDW: 18.6 % — ABNORMAL HIGH (ref 11.5–15.5)
WBC: 6.6 10*3/uL (ref 4.0–10.5)
nRBC: 0 % (ref 0.0–0.2)

## 2020-07-09 LAB — COMPREHENSIVE METABOLIC PANEL
ALT: 17 U/L (ref 0–44)
AST: 32 U/L (ref 15–41)
Albumin: 3.5 g/dL (ref 3.5–5.0)
Alkaline Phosphatase: 70 U/L (ref 38–126)
Anion gap: 7 (ref 5–15)
BUN: 20 mg/dL (ref 8–23)
CO2: 26 mmol/L (ref 22–32)
Calcium: 8.9 mg/dL (ref 8.9–10.3)
Chloride: 102 mmol/L (ref 98–111)
Creatinine, Ser: 0.79 mg/dL (ref 0.44–1.00)
GFR, Estimated: >60 mL/min (ref >60)
Glucose, Bld: 132 mg/dL — ABNORMAL HIGH (ref 70–99)
Potassium: 3.5 mmol/L (ref 3.5–5.1)
Sodium: 135 mmol/L (ref 135–145)
Total Bilirubin: 0.5 mg/dL (ref 0.3–1.2)
Total Protein: 7.6 g/dL (ref 6.5–8.1)

## 2020-07-09 LAB — MAGNESIUM: Magnesium: 1.8 mg/dL (ref 1.7–2.4)

## 2020-07-09 NOTE — Progress Notes (Signed)
[facility] [address]  [address]   Patient Care Team: Lemmie Evens, MD as PCP - General (Family Medicine) Gala Romney, Cristopher Estimable, MD as Consulting Physician (Gastroenterology) Donetta Potts, RN as Oncology Nurse Navigator (Oncology) Brien Mates, RN as Oncology Nurse Navigator (Oncology)  SUMMARY OF ONCOLOGIC HISTORY: Oncology History  Malignant neoplasm of overlapping sites of left breast in female, estrogen receptor positive ([place])  11/16/2019 Cancer Staging   Staging form: Breast, AJCC 8th Edition - Clinical stage from 11/16/2019: Stage IV (cT4b, cN1, cM1, G3, ER+, PR-, HER2-) - Signed by [name], MD on 12/17/2019   11/28/2019 Initial Diagnosis   Patient noted intermittent left breast pain and swelling for several months. Mammogram showed a 6.2cm mass in the left breast axillary tail with an adjacent 0.9cm mass, 2 abnormal left axillary lymph nodes, 1 abnormal right axillary lymph node, and diffuse left breast edema and skin thickening. Biopsy showed IDC, grade 3 in the left breast and bilateral axillas, HER-2 negative (1+), ER+ 100%, PR- 0%, Ki67 30%.    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 is a 70 y.o. female here today for follow up of her left breast cancer. Her last visit was on 06/23/2020.   Today she is accompanied by her husband and she reports feeling pain all over her body. Her left chest has become slightly more tight since stopping chemo. She complains of having a slightly  productive cough since starting chemo and it has slightly worsened. She has not received her Xeloda yet. She saw a small amount of blood on toilet paper after wiping last week and once this week; she continues taking Eliquis BID. She started taking iron tablets daily and denies having constipation. The numbness and soreness in her fingertips is improving and she is taking gabapentin.  She will see Dr. Gala Romney on 03/09.   REVIEW OF SYSTEMS:   Review of Systems  Constitutional: Positive for fatigue (50%). Negative for appetite change.  Respiratory: Positive for cough (slightly productive; worsening) and shortness of breath.   Cardiovascular: Positive for chest pain (tightness in L chest).  Gastrointestinal: Positive for blood in stool (small amount). Negative for constipation.  All other systems reviewed and are negative.   I have reviewed the past medical history, past surgical history, social history and family history with the patient and they are unchanged from previous note.   ALLERGIES:   has No Known Allergies.   MEDICATIONS:  Current Outpatient Medications  Medication Sig Dispense Refill  . apixaban (ELIQUIS) 5 MG TABS tablet Take 1 tablet (5 mg total) by mouth 2 (two) times daily. 60 tablet 3  . benzonatate (TESSALON) 100 MG capsule Take 100 mg by mouth 3 (three) times daily as needed for cough.    . capecitabine (XELODA) 500 MG tablet Take 5 tablets (2,500 mg total) by mouth 2 (two) times daily after a meal. Take for 14 days, then hold for 7 days. Repeat every 21 days. 140 tablet 0  .  ferrous sulfate 325 (65 FE) MG tablet Take 325 mg by mouth daily with breakfast.    . gabapentin (NEURONTIN) 300 MG capsule Take 1 capsule (300 mg total) by mouth at bedtime. 30 capsule 1  . hydrochlorothiazide (HYDRODIURIL) 25 MG tablet Take 25 mg by mouth daily.    Marland Kitchen ibuprofen (ADVIL,MOTRIN) 800 MG tablet Take 800 mg by mouth every 8 (eight) hours as needed for moderate pain.     Marland Kitchen losartan  (COZAAR) 50 MG tablet Take 50 mg by mouth daily.    . metFORMIN (GLUCOPHAGE) 500 MG tablet Take 500 mg by mouth daily with breakfast.     . oxybutynin (DITROPAN) 5 MG tablet Take 5 mg by mouth daily.     Marland Kitchen PACLitaxel (TAXOL IV) Inject 80 mg/m2 into the vein once a week. Day 1, 8, 15 every 28 days    . pantoprazole (PROTONIX) 40 MG tablet Take 40 mg by mouth daily.    . potassium chloride (MICRO-K) 10 MEQ CR capsule Take 10 mEq by mouth daily.     . pravastatin (PRAVACHOL) 10 MG tablet Take 10 mg by mouth daily.     Marland Kitchen HYDROcodone-acetaminophen (NORCO/VICODIN) 5-325 MG tablet Take 1 tablet by mouth every 6 (six) hours as needed for moderate pain. (Patient not taking: Reported on 07/09/2020) 15 tablet 0  . HYDROmorphone (DILAUDID) 2 MG tablet Take 1 tablet (2 mg total) by mouth every 4 (four) hours as needed for severe pain. (Patient not taking: Reported on 07/09/2020) 30 tablet 0   No current facility-administered medications for this visit.     PHYSICAL EXAMINATION: Performance status (ECOG): 1 - Symptomatic but completely ambulatory  Vitals:   07/09/20 1046  BP: 105/62  Pulse: (!) 102  Resp: 18  Temp: 98.4 F (36.9 C)  SpO2: 98%   Wt Readings from Last 3 Encounters:  07/09/20 293 lb (132.9 kg)  06/23/20 296 lb 4.8 oz (134.4 kg)  06/02/20 291 lb 6.4 oz (132.2 kg)   Physical Exam Vitals reviewed.  Constitutional:      Appearance: Normal appearance. She is obese.     Comments: In wheelchair  Cardiovascular:     Rate and Rhythm: Normal rate and regular rhythm.     Pulses: Normal pulses.     Heart sounds: Normal heart sounds.  Pulmonary:     Effort: Pulmonary effort is normal.     Breath sounds: Normal breath sounds.  Chest:  Breasts:     Right: No supraclavicular adenopathy.     Left: No supraclavicular adenopathy.    Lymphadenopathy:     Cervical: No cervical adenopathy.     Upper Body:     Right upper body: No supraclavicular adenopathy.     Left upper body: No  supraclavicular adenopathy.  Neurological:     General: No focal deficit present.     Mental Status: She is alert and oriented to person, place, and time.  Psychiatric:        Mood and Affect: Mood normal.        Behavior: Behavior normal.     Breast Exam Chaperone: Milinda Antis, MD     LABORATORY DATA:  I have reviewed the data as listed CMP Latest Ref Rng & Units 07/09/2020 06/23/2020 06/02/2020  Glucose 70 - 99 mg/dL 132(H) 126(H) 191(H)  BUN 8 - 23 mg/dL 20 16 18   Creatinine 0.44 - 1.00 mg/dL 0.79 0.79 0.74  Sodium 135 - 145 mmol/L 135 134(L) 137  Potassium 3.5 - 5.1  mmol/L 3.5 3.5 3.6  Chloride 98 - 111 mmol/L 102 102 103  CO2 22 - 32 mmol/L 26 25 25   Calcium 8.9 - 10.3 mg/dL 8.9 8.6(L) 8.7(L)  Total Protein 6.5 - 8.1 g/dL 7.6 6.9 6.9  Total Bilirubin 0.3 - 1.2 mg/dL 0.5 0.3 0.3  Alkaline Phos 38 - 126 U/L 70 64 67  AST 15 - 41 U/L 32 28 24  ALT 0 - 44 U/L 17 15 16    Lab Results  Component Value Date   CAN153 35.3 (H) 06/23/2020   CAN153 37.0 (H) 05/20/2020   Lab Results  Component Value Date   WBC 6.6 07/09/2020   HGB 9.6 (L) 07/09/2020   HCT 32.8 (L) 07/09/2020   MCV 90.4 07/09/2020   PLT 292 07/09/2020   NEUTROABS 4.0 07/09/2020    ASSESSMENT:  1. 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 -MRI of the breast on 12/10/2019 shows left breast malignancy involving all 4 quadrants and skin of the left breast including 0.5 cm biopsy-proven malignancy within the far posterior outer right breast, abnormal highly suspicious nonmuscle like enhancement within all 4 quadrants of the left breast and diffuse skin thickening. This abnormal non-mass-like enhancement and skin thickening extends across the midline to the medial right breast. At least 7 abnormal left  axillary lymph nodes and 1 abnormal right axillary lymph node compatible with metastatic disease. -CT CAP on 12/12/2019 shows bulky lymphadenopathy in the left subpectoral and axillary regions with largest lymph node measuring 3.3 cm. Mild mediastinal adenopathy in the right paratracheal prevascular and lateral aortic regions, largest lymph node in the lateral aortic region measuring 2.4 cm. Lymphadenopathy in the inferior jugular and supraclavicular regions, left side greater than right. Mild lymphadenopathy in the right axilla. -Multiple small hypervascular lesions seen in the right and left lobesconsistent with liver metastasis. -Bone scan on 12/12/2019 did not show any bone metastasis. -CT CAP on 12/29/2019 showed possible lytic lesion in the T4 vertebral body. -Liver biopsy on 12/28/2019 consistent with metastatic breast cancer. -Paclitaxel weekly 3 weeks on 1 week off started on 12/31/2019. -CT CAP on 03/20/2020 shows mild decrease in the left axillary, subpectoral, supraclavicular, mediastinal and lower jugular lymphadenopathy. Stable small low-attenuation liver lesion. No new progressive metastatic disease. Stable lesion in the left sternal manubrium and T4 vertebral body. Stable short segment concentric wall thickening of the right colon with subcentimeter pericolonic lymph nodes. We will monitor this finding as she has metastatic breast cancer. She is asymptomatic. -CT CAP on 06/18/1998 6:22 cycles of chemotherapy showed no new or progressive metastatic disease. Similar size of the index and nonindex left axillary subpectoral, supraclavicular and lower jugular lymphadenopathy.  2. Right colonic mass: -CT scan showed constricting mass involving the ascending colon measuring 3.6 x 3.5 cm with a small 1 cm right pericolonic lymph node. -She never had a colonoscopy. Denies any bleeding per rectum or melena.  3. Left leg DVT: -Doppler on 03/03/2020 shows left femoral vein, popliteal vein  and posterior tibial vein deep vein thrombosis.   PLAN:  1. Metastatic left breast cancerto the liver: -She has not received Xeloda yet. -Her full dose is 2500 mg twice daily 2 weeks on 1 week off. -I have recommended her to start taking 1000 mg twice daily until she comes to see me in 2 weeks.  I will gradually titrate up the dose as tolerated. -We discussed the side effects in detail including but not limited to hand-foot skin reaction, mucositis, skin rashes, cytopenias, diarrhea.   2. Right colonic mass: -She had few episodes of blood on the tissue paper. -He has follow-up with Dr. Sydell Axon for colonoscopy.  3. Peripheral neuropathy: -She is not taking gabapentin at this time as her pain in the fingertips has improved after we stopped the paclitaxel.  4.Left leg DVT: -Continue Eliquis twice daily.  No bleeding issues noted.    No orders of the defined types were placed in this encounter.  The patient has a good understanding of the overall plan. she agrees with it. she will call with any problems that may develop before the next visit here.    [name], MD [place] [phone]   I, Milinda Antis, am acting as a scribe for Dr. Sanda Linger.  I, [name] MD, have reviewed the above documentation for accuracy and completeness, and I agree with the above.

## 2020-07-09 NOTE — Patient Instructions (Addendum)
[place] at [facility] Discharge Instructions  You were seen today by Dr. Delton Coombes. He went over your recent results. Take Compazine 30 minutes prior to taking the Xeloda. Once you receive the Xeloda, start taking only 2 tablets twice daily for 2 weeks with 1 week off. If you develop skin blistering or peeling, please call the office immediately. Take a stool softener with your iron tablets if you develop constipation. Keep your appointment with Dr. Gala Romney. Dr. Delton Coombes will see you back in 2 weeks for labs and follow up.   Thank you for choosing [place] at [facility] to provide your oncology and hematology care.  To afford each patient quality time with our provider, please arrive at least 15 minutes before your scheduled appointment time.   If you have a lab appointment with the [facility] please come in thru the Main Entrance and check in at the main information desk  You need to re-schedule your appointment should you arrive 10 or more minutes late.  We strive to give you quality time with our providers, and arriving late affects you and other patients whose appointments are after yours.  Also, if you no show three or more times for appointments you may be dismissed from the clinic at the providers discretion.     Again, thank you for choosing [facility].  Our hope is that these requests will decrease the amount of time that you wait before being seen by our physicians.       _____________________________________________________________  Should you have questions after your visit to [facility], please contact our office at [phone] between the hours of 8:00 a.m. and 4:30 p.m.  Voicemails left after 4:00 p.m. will not be returned until the following business day.  For prescription refill requests, have your pharmacy contact our office and allow 72 hours.    Cancer Center Support Programs:   > Cancer  Support Group  2nd Tuesday of the month 1pm-2pm, Journey Room

## 2020-07-28 ENCOUNTER — Other Ambulatory Visit (HOSPITAL_COMMUNITY): Payer: Self-pay

## 2020-07-28 ENCOUNTER — Inpatient Hospital Stay (HOSPITAL_COMMUNITY): Payer: Medicare HMO

## 2020-07-28 ENCOUNTER — Ambulatory Visit (HOSPITAL_COMMUNITY)
Admission: RE | Admit: 2020-07-28 | Discharge: 2020-07-28 | Disposition: A | Discharge status: Home / Self Care | Payer: Medicare HMO | Source: Ambulatory Visit | Attending: Hematology | Admitting: Hematology

## 2020-07-28 ENCOUNTER — Other Ambulatory Visit: Payer: Self-pay

## 2020-07-28 ENCOUNTER — Inpatient Hospital Stay (HOSPITAL_COMMUNITY): Payer: Medicare HMO | Admitting: Hematology

## 2020-07-28 VITALS — HR 98 | Temp 96.9°F | Resp 18 | Wt 291.2 lb

## 2020-07-28 DIAGNOSIS — Z170 Estrogen receptor positive status [ER+]: Secondary | ICD-10-CM

## 2020-07-28 DIAGNOSIS — C50812 Malignant neoplasm of overlapping sites of left female breast: Secondary | ICD-10-CM

## 2020-07-28 DIAGNOSIS — Z95828 Presence of other vascular implants and grafts: Secondary | ICD-10-CM | POA: Diagnosis not present

## 2020-07-28 LAB — CBC WITH DIFFERENTIAL/PLATELET
Abs Immature Granulocytes: 0.02 10*3/uL (ref 0.00–0.07)
Basophils Absolute: 0.1 10*3/uL (ref 0.0–0.1)
Basophils Relative: 1 %
Eosinophils Absolute: 0.1 10*3/uL (ref 0.0–0.5)
Eosinophils Relative: 3 %
HCT: 33.9 % — ABNORMAL LOW (ref 36.0–46.0)
Hemoglobin: 10.2 g/dL — ABNORMAL LOW (ref 12.0–15.0)
Immature Granulocytes: 0 %
Lymphocytes Relative: 26 %
Lymphs Abs: 1.3 10*3/uL (ref 0.7–4.0)
MCH: 27.1 pg (ref 26.0–34.0)
MCHC: 30.1 g/dL (ref 30.0–36.0)
MCV: 89.9 fL (ref 80.0–100.0)
Monocytes Absolute: 0.6 10*3/uL (ref 0.1–1.0)
Monocytes Relative: 12 %
Neutro Abs: 2.8 10*3/uL (ref 1.7–7.7)
Neutrophils Relative %: 58 %
Platelets: 238 10*3/uL (ref 150–400)
RBC: 3.77 MIL/uL — ABNORMAL LOW (ref 3.87–5.11)
RDW: 17.6 % — ABNORMAL HIGH (ref 11.5–15.5)
WBC: 4.9 10*3/uL (ref 4.0–10.5)
nRBC: 0 % (ref 0.0–0.2)

## 2020-07-28 LAB — COMPREHENSIVE METABOLIC PANEL
ALT: 21 U/L (ref 0–44)
AST: 35 U/L (ref 15–41)
Albumin: 3.3 g/dL — ABNORMAL LOW (ref 3.5–5.0)
Alkaline Phosphatase: 67 U/L (ref 38–126)
Anion gap: 10 (ref 5–15)
BUN: 18 mg/dL (ref 8–23)
CO2: 26 mmol/L (ref 22–32)
Calcium: 8.8 mg/dL — ABNORMAL LOW (ref 8.9–10.3)
Chloride: 100 mmol/L (ref 98–111)
Creatinine, Ser: 0.73 mg/dL (ref 0.44–1.00)
GFR, Estimated: >60 mL/min (ref >60)
Glucose, Bld: 152 mg/dL — ABNORMAL HIGH (ref 70–99)
Potassium: 3.4 mmol/L — ABNORMAL LOW (ref 3.5–5.1)
Sodium: 136 mmol/L (ref 135–145)
Total Bilirubin: 0.5 mg/dL (ref 0.3–1.2)
Total Protein: 6.9 g/dL (ref 6.5–8.1)

## 2020-07-28 LAB — URINALYSIS, DIPSTICK ONLY
Bilirubin Urine: NEGATIVE
Glucose, UA: NEGATIVE mg/dL
Ketones, ur: NEGATIVE mg/dL
Leukocytes,Ua: NEGATIVE
Nitrite: NEGATIVE
Protein, ur: NEGATIVE mg/dL
Specific Gravity, Urine: 1.016 (ref 1.005–1.030)
pH: 5 (ref 5.0–8.0)

## 2020-07-28 LAB — MAGNESIUM: Magnesium: 1.8 mg/dL (ref 1.7–2.4)

## 2020-07-28 MED ORDER — HYDROMORPHONE HCL 2 MG PO TABS: 2.0000 mg | ORAL_TABLET | ORAL | 0 refills | Status: DC | PRN

## 2020-07-28 MED ORDER — HYDROCOD POLST-CPM POLST ER 10-8 MG/5ML PO SUER: ORAL | 0 refills | Status: DC

## 2020-07-28 MED ORDER — DENOSUMAB 120 MG/1.7ML ~~LOC~~ SOLN
SUBCUTANEOUS | Status: AC
  Filled 2020-07-28: qty 1.7

## 2020-07-28 MED ORDER — OCTREOTIDE ACETATE 30 MG IM KIT
PACK | INTRAMUSCULAR | Status: AC
  Filled 2020-07-28: qty 1

## 2020-07-28 MED ORDER — PROCHLORPERAZINE MALEATE 10 MG PO TABS: 10.0000 mg | ORAL_TABLET | Freq: Four times a day (QID) | ORAL | 1 refills | Status: DC | PRN

## 2020-07-28 NOTE — Progress Notes (Signed)
[facility] [address], [place] [address]   Patient Care Team: Lemmie Evens, MD as PCP - General (Family Medicine) Gala Romney, Cristopher Estimable, MD as Consulting Physician (Gastroenterology) Donetta Potts, RN as Oncology Nurse Navigator (Oncology) Brien Mates, RN as Oncology Nurse Navigator (Oncology)  SUMMARY OF ONCOLOGIC HISTORY: Oncology History  Malignant neoplasm of overlapping sites of left breast in female, estrogen receptor positive ([place])  11/16/2019 Cancer Staging   Staging form: Breast, AJCC 8th Edition - Clinical stage from 11/16/2019: Stage IV (cT4b, cN1, cM1, G3, ER+, PR-, HER2-) - Signed by [name], MD on 12/17/2019   11/28/2019 Initial Diagnosis   Patient noted intermittent left breast pain and swelling for several months. Mammogram showed a 6.2cm mass in the left breast axillary tail with an adjacent 0.9cm mass, 2 abnormal left axillary lymph nodes, 1 abnormal right axillary lymph node, and diffuse left breast edema and skin thickening. Biopsy showed IDC, grade 3 in the left breast and bilateral axillas, HER-2 negative (1+), ER+ 100%, PR- 0%, Ki67 30%.    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 female here today for follow up of her left breast cancer. Her last visit was on 07/09/2020.   Today she is accompanied by her husband and she reports feeling fair. She is taking Xeloda 1,000 mg BID; her last dose is on 03/02. She complains of having pain around her port, worsening cough and  hoarse voice over the past 2 weeks; she is producing thick clear sputum. She denies having dysuria but continues having hematuria since. She is having soft BM's and is taking Tussionex. She denies having skin peeling or mouth sores.  She has an appointment with Dr. Gala Romney on 03/02.   REVIEW OF SYSTEMS:   Review of Systems  Constitutional: Positive for fatigue (75%). Negative for appetite change.  HENT:   Positive for voice change (intermittent hoarseness).   Respiratory: Positive for cough (w/ intermittent thick clear sputum).   Cardiovascular: Positive for chest pain (around & below port site).  All other systems reviewed and are negative.   I have reviewed the past medical history, past surgical history, social history and family history with the patient and they are unchanged from previous note.   ALLERGIES:   has No Known Allergies.   MEDICATIONS:  Current Outpatient Medications  Medication Sig Dispense Refill  . apixaban (ELIQUIS) 5 MG TABS tablet Take 1 tablet (5 mg total) by mouth 2 (two) times daily. 60 tablet 3  . benzonatate (TESSALON) 100 MG capsule Take 100 mg by mouth 3 (three) times daily as needed for cough.    . capecitabine (XELODA) 500 MG tablet Take 5 tablets (2,500 mg total) by mouth 2 (two) times daily after a meal. Take for 14 days, then hold for 7 days. Repeat every 21 days. 140 tablet 0  . chlorpheniramine-HYDROcodone (TUSSIONEX) 10-8 MG/5ML SUER SMARTSIG:1 Teaspoon By Mouth Twice Daily    . ferrous sulfate 325 (65 FE)  MG tablet Take 325 mg by mouth daily with breakfast.    . gabapentin (NEURONTIN) 300 MG capsule Take 1 capsule (300 mg total) by mouth at bedtime. 30 capsule 1  . hydrochlorothiazide (HYDRODIURIL) 25 MG tablet Take 25 mg by mouth daily.    Marland Kitchen HYDROcodone-acetaminophen (NORCO/VICODIN) 5-325 MG tablet Take 1 tablet by mouth every 6 (six) hours as needed for moderate pain. 15 tablet 0  . HYDROmorphone (DILAUDID) 2 MG tablet Take 1 tablet (2 mg total)  by mouth every 4 (four) hours as needed for severe pain. 30 tablet 0  . ibuprofen (ADVIL,MOTRIN) 800 MG tablet Take 800 mg by mouth every 8 (eight) hours as needed for moderate pain.     Marland Kitchen losartan (COZAAR) 50 MG tablet Take 50 mg by mouth daily.    . metFORMIN (GLUCOPHAGE) 500 MG tablet Take 500 mg by mouth daily with breakfast.     . oxybutynin (DITROPAN) 5 MG tablet Take 5 mg by mouth daily.     Marland Kitchen PACLitaxel (TAXOL IV) Inject 80 mg/m2 into the vein once a week. Day 1, 8, 15 every 28 days    . pantoprazole (PROTONIX) 40 MG tablet Take 40 mg by mouth daily.    . potassium chloride (MICRO-K) 10 MEQ CR capsule Take 10 mEq by mouth daily.     . pravastatin (PRAVACHOL) 10 MG tablet Take 10 mg by mouth daily.      No current facility-administered medications for this visit.     PHYSICAL EXAMINATION: Performance status (ECOG): 1 - Symptomatic but completely ambulatory  Vitals:   07/28/20 0847  Pulse: 98  Resp: 18  Temp: (!) 96.9 F (36.1 C)  SpO2: 100%   Wt Readings from Last 3 Encounters:  07/28/20 291 lb 3.2 oz (132.1 kg)  07/09/20 293 lb (132.9 kg)  06/23/20 296 lb 4.8 oz (134.4 kg)   Physical Exam Vitals reviewed.  Constitutional:      Appearance: Normal appearance. She is obese.     Comments: In wheelchair  Cardiovascular:     Rate and Rhythm: Normal rate and regular rhythm.     Pulses: Normal pulses.     Heart sounds: Normal heart sounds.  Pulmonary:     Effort: Pulmonary effort is normal.     Breath sounds: Normal breath sounds.  Chest:     Chest wall: Tenderness (above & below R chest port site) present.  Breasts:     Right: No axillary adenopathy or supraclavicular adenopathy.     Left: Supraclavicular adenopathy present. No axillary adenopathy.      Comments: Port-a-Cath in R chest Lymphadenopathy:     Cervical: No cervical adenopathy.     Upper Body:     Right upper body: No supraclavicular, axillary or pectoral adenopathy.     Left upper body:  Supraclavicular adenopathy present. No axillary or pectoral adenopathy.  Skin:    Findings: Rash present. Rash is macular (on L breast surface).  Neurological:     General: No focal deficit present.     Mental Status: She is alert and oriented to person, place, and time.  Psychiatric:        Mood and Affect: Mood normal.        Behavior: Behavior normal.     Breast Exam Chaperone: Milinda Antis, MD     LABORATORY DATA:  I have reviewed the data as listed CMP Latest Ref Rng & Units 07/28/2020 07/09/2020 06/23/2020  Glucose 70 - 99 mg/dL 152(H) 132(H) 126(H)  BUN 8 - 23 mg/dL 18 20 16   Creatinine 0.44 - 1.00 mg/dL 0.73 0.79 0.79  Sodium 135 - 145 mmol/L 136 135 134(L)  Potassium 3.5 - 5.1 mmol/L 3.4(L) 3.5 3.5  Chloride 98 - 111 mmol/L 100 102 102  CO2 22 - 32 mmol/L 26 26 25   Calcium 8.9 - 10.3 mg/dL 8.8(L) 8.9 8.6(L)  Total Protein 6.5 - 8.1 g/dL 6.9 7.6 6.9  Total Bilirubin 0.3 - 1.2 mg/dL 0.5 0.5 0.3  Alkaline Phos 38 - 126 U/L 67 70 64  AST 15 - 41 U/L 35 32 28  ALT 0 - 44 U/L 21 17 15    Lab Results  Component Value Date   CAN153 35.3 (H) 06/23/2020   CAN153 37.0 (H) 05/20/2020   Lab Results  Component Value Date   WBC 4.9 07/28/2020   HGB 10.2 (L) 07/28/2020   HCT 33.9 (L) 07/28/2020   MCV 89.9 07/28/2020   PLT 238 07/28/2020   NEUTROABS 2.8 07/28/2020    ASSESSMENT:  1. 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 -MRI of the breast on 12/10/2019 shows left breast malignancy involving all 4 quadrants and skin of the left breast including 0.5 cm biopsy-proven malignancy within the far posterior outer right breast, abnormal highly suspicious nonmuscle like enhancement within all 4 quadrants of the left breast and diffuse skin thickening. This abnormal  non-mass-like enhancement and skin thickening extends across the midline to the medial right breast. At least 7 abnormal left axillary lymph nodes and 1 abnormal right axillary lymph node compatible with metastatic disease. -CT CAP on 12/12/2019 shows bulky lymphadenopathy in the left subpectoral and axillary regions with largest lymph node measuring 3.3 cm. Mild mediastinal adenopathy in the right paratracheal prevascular and lateral aortic regions, largest lymph node in the lateral aortic region measuring 2.4 cm. Lymphadenopathy in the inferior jugular and supraclavicular regions, left side greater than right. Mild lymphadenopathy in the right axilla. -Multiple small hypervascular lesions seen in the right and left lobesconsistent with liver metastasis. -Bone scan on 12/12/2019 did not show any bone metastasis. -CT CAP on 12/29/2019 showed possible lytic lesion in the T4 vertebral body. -Liver biopsy on 12/28/2019 consistent with metastatic breast cancer. -Paclitaxel weekly 3 weeks on 1 week off started on 12/31/2019. -CT CAP on 03/20/2020 shows mild decrease in the left axillary, subpectoral, supraclavicular, mediastinal and lower jugular lymphadenopathy. Stable small low-attenuation liver lesion. No new progressive metastatic disease. Stable lesion in the left sternal manubrium and T4 vertebral body. Stable short segment concentric wall thickening of the right colon with subcentimeter pericolonic lymph nodes. We will monitor this finding as she has metastatic breast cancer. She is asymptomatic. -CT CAP on 06/18/1998 6:22 cycles of chemotherapy showed no new or progressive metastatic disease. Similar size of the index and nonindex left axillary subpectoral, supraclavicular and lower jugular lymphadenopathy.  2. Right colonic mass: -CT scan showed constricting mass involving the ascending colon measuring 3.6 x 3.5 cm with a small 1 cm right pericolonic lymph node. -She never had a colonoscopy.  Denies any bleeding per rectum or melena.  3. Left leg DVT: -Doppler on 03/03/2020 shows left femoral vein, popliteal vein and posterior tibial vein deep vein thrombosis.   PLAN:  1. Metastatic left breast cancerto the liver: -Xeloda 1000 mg twice daily started on 07/17/2020. -She did  not experience any major side effects from Xeloda. -She reported blood in the urine since Wednesday.  We have done a UA in the office.  It was positive for hemoglobin moderate amount but negative for any infection.  She denies any history of kidney stones. -She also reported right anterior chest wall pain towards the axillary region.  She thinks she is having pain as she is using the right arm more often to lift herself up from the bed and chair. -I have done a chest x-ray in the office today as she was also complaining of cough with thick whitish expectoration in the last 2 weeks. -I have reviewed her labs today which showed normal LFTs and grossly normal electrolytes.  White count is normal. -Chest x-ray did not reveal any infection.  Chronic elevation of the right hemidiaphragm stable. -She will continue Tussionex cough syrup.  We have given refills. -She will start her next cycle of Xeloda on 08/07/2020 with 4 tablets twice daily 2 weeks on 1 week off. -RTC 3 weeks for follow-up.  She wants to have her labs done through the port.  2. Right colonic mass: -She has on and off for bleeding per rectum. -She will have follow-up with Dr. Sydell Axon on Wednesday for colonoscopy.  3.Peripheral neuropathy: -Her neuropathy in the fingertips has improved after we stopped paclitaxel.  She is not on gabapentin.  4.Left leg DVT: -Continue Eliquis twice daily.  No bleeding issues reported.   Breast Cancer therapy associated bone loss: I have recommended calcium, Vitamin D and weight bearing exercises.   Orders Placed This Encounter  Procedures  . DG Chest 2 View    Standing Status:   Future    Standing  Expiration Date:   07/28/2021    Order Specific Question:   Reason for Exam (SYMPTOM  OR DIAGNOSIS REQUIRED)    Answer:   worsening productive cough    Order Specific Question:   Preferred imaging location?    Answer:   [address] [facility]    Order Specific Question:   Release to patient    Answer:   Immediate  . Urinalysis, dipstick only    Standing Status:   Future    Number of Occurrences:   1    Standing Expiration Date:   07/28/2021    Order Specific Question:   Release to patient    Answer:   Immediate   The patient has a good understanding of the overall plan. she agrees with it. she will call with any problems that may develop before the next visit here.    [name], MD [place] [phone]   I, Milinda Antis, am acting as a scribe for Dr. Sanda Linger.  I, [name] MD, have reviewed the above documentation for accuracy and completeness, and I agree with the above.

## 2020-07-28 NOTE — Patient Instructions (Signed)
Crystal Vega at [facility] Discharge Instructions  You were seen today by Dr. Delton Coombes. He went over your recent results. You had a urinalysis and chest x-ray today to determine the cause of your worsening cough and voice hoarseness. When you restart Xeloda on 03/10, start taking 4 tablets twice daily at least 10 to 12 hours apart. Your port will need to be flushed every 3 months. Dr. Delton Coombes will see you back in 3 weeks for labs and follow up.   Thank you for choosing [facility] to provide your oncology and hematology care.  To afford each patient quality time with our provider, please arrive at least 15 minutes before your scheduled appointment time.   If you have a lab appointment with the [facility] please come in thru the Main Entrance and check in at the main information desk  You need to re-schedule your appointment should you arrive 10 or more minutes late.  We strive to give you quality time with our providers, and arriving late affects you and other patients whose appointments are after yours.  Also, if you no show three or more times for appointments you may be dismissed from the clinic at the providers discretion.     Again, thank you for choosing [facility].  Our hope is that these requests will decrease the amount of time that you wait before being seen by our physicians.       _____________________________________________________________  Should you have questions after your visit to [facility], please contact our office at [phone] between the hours of 8:00 a.m. and 4:30 p.m.  Voicemails left after 4:00 p.m. will not be returned until the following business day.  For prescription refill requests, have your pharmacy contact our office and allow 72 hours.    Cancer Center Support Programs:   > Cancer Support Group  2nd Tuesday of the month 1pm-2pm, Journey Room

## 2020-07-29 MED ORDER — HYDROCOD POLST-CPM POLST ER 10-8 MG/5ML PO SUER: ORAL | 0 refills | Status: DC

## 2020-07-29 MED ORDER — HYDROMORPHONE HCL 2 MG PO TABS: 2.0000 mg | ORAL_TABLET | ORAL | 0 refills | Status: DC | PRN

## 2020-08-06 ENCOUNTER — Encounter: Payer: Self-pay | Admitting: Nurse Practitioner

## 2020-08-06 ENCOUNTER — Encounter: Payer: Self-pay | Admitting: *Deleted

## 2020-08-06 ENCOUNTER — Encounter: Payer: Self-pay | Admitting: Internal Medicine

## 2020-08-06 ENCOUNTER — Telehealth (INDEPENDENT_AMBULATORY_CARE_PROVIDER_SITE_OTHER): Payer: Medicare HMO | Admitting: Nurse Practitioner

## 2020-08-06 ENCOUNTER — Telehealth: Payer: Self-pay

## 2020-08-06 ENCOUNTER — Telehealth: Payer: Self-pay | Admitting: *Deleted

## 2020-08-06 ENCOUNTER — Other Ambulatory Visit: Payer: Self-pay

## 2020-08-06 DIAGNOSIS — Z800 Family history of malignant neoplasm of digestive organs: Secondary | ICD-10-CM | POA: Diagnosis not present

## 2020-08-06 DIAGNOSIS — K625 Hemorrhage of anus and rectum: Secondary | ICD-10-CM | POA: Insufficient documentation

## 2020-08-06 DIAGNOSIS — K6389 Other specified diseases of intestine: Secondary | ICD-10-CM

## 2020-08-06 MED ORDER — NA SULFATE-K SULFATE-MG SULF 17.5-3.13-1.6 GM/177ML PO SOLN: 1.0000 | Freq: Once | ORAL | 0 refills | Status: AC

## 2020-08-06 NOTE — Progress Notes (Signed)
Referring Provider: Lemmie Evens, MD Primary Care Physician:  Lemmie Evens, MD Primary GI:  Dr. Gala Romney  NOTE: Service was provided via telemedicine and was requested by the patient due to COVID-19 pandemic.  Patient Location: Home  Provider Location: [place] office  Reason for Phone Visit: Schedule colonoscopy  Persons present on the phone encounter, with roles: Patient, myself (provider),[name], LPN (updated meds and allergies)  Total time (minutes) spent on medical discussion: 24 minutes  Due to COVID-19, visit was conducted using the virtual method noted. Visit was requested by patient.  I connected with Crystal Vega on 08/06/20 at  9:30 AM EST by Video Call and verified that I am speaking with the correct person using two identifiers.   I discussed the limitations, risks, security and privacy concerns of performing an evaluation and management service by telephone and the availability of in person appointments. I also discussed with the patient that there may be a patient responsible charge related to this service. The patient expressed understanding and agreed to proceed.  Chief Complaint  Patient presents with  . Colonoscopy    Never had tcs  . Rectal Bleeding    HPI:   Crystal Vega is a 70 y.o. female who presents for virtual visit regarding: Referral from oncology to reschedule colonoscopy.  Nurse/phone triage was deferred office visit due to medications.  Reviewed information associated with referral including oncology office visit 06/23/2020 for malignant neoplasm of the left breast, estrogen receptor positive.  Her breast cancer was diagnosed 11/16/2019 and has undergone chemotherapy.  At her last visit she was due for cycle #7 of paclitaxel.  At that time it was noted that her CT scan dated 06/18/2020 found several small poorly defined low-attenuation hepatic lesions difficult to accurately measure given ill-defined margins with no significant change compared  to prior study.  Also noted persistent short segment ascending colon wall thickening proximal to the hepatic flexure with tiny subcentimeter adjacent pericolonic lymph nodes again seen.  Recommended further evaluation with colonoscopy.  This is also seen on CT 03/20/2020 and at the office visit at that time it was noted she has never had a colonoscopy.  No history of colonoscopy found in our system.  Today she states doing okay overall. Has had some abdominal cramping. No constipation currently, but has before. Has dark stools on iron. Has hematochezia with every bowel movement since starting chemotherapy. She is on Eliquis for DVT. Denies N/V, fever, chills, unintentional weight loss. She has had hoarseness for the last couple weeks and she has notified Dr. Raliegh Ip of this and she is on Tussonex syrup which helps. The patient has not received COVID-19 vaccination(s). They state she was just given permission to get a vaccine but hasn't done it yet. Plans to get first dose tomorrow. Denies chest pain, dyspnea, dizziness, lightheadedness, syncope, near syncope. Denies any other upper or lower GI symptoms.  Past Medical History:  Diagnosis Date  . Anemia   . Arthritis    per patient " left knee"  . Essential hypertension, benign   . Family history of cancer of female genital organ   . Family history of GI tract cancer   . Metastatic breast cancer ([place])    left breast  . Port-A-Cath in place 12/25/2019  . Type 2 diabetes mellitus ([facility] [place] [facility])     Past Surgical History:  Procedure Laterality Date  . CATARACT EXTRACTION W/ INTRAOCULAR LENS IMPLANT Right   . HYSTEROSCOPY WITH D & C N/A 06/05/2014  Procedure: DILATATION AND CURETTAGE /HYSTEROSCOPY;  Surgeon: Florian Buff, MD;  Location: [facility];  Service: Gynecology;  Laterality: N/A;  . POLYPECTOMY N/A 06/05/2014   Procedure: ENDOMETRIAL POLYPECTOMY;  Surgeon: Florian Buff, MD;  Location: [facility];  Service: Gynecology;  Laterality: N/A;  . PORTACATH PLACEMENT N/A  12/14/2019   Procedure: INSERTION PORT-A-CATH WITH ULTRASOUND GUIDANCE;  Surgeon: Donnie Mesa, MD;  Location: [facility];  Service: General;  Laterality: N/A;  . TUBAL LIGATION      Current Outpatient Medications  Medication Sig Dispense Refill  . apixaban (ELIQUIS) 5 MG TABS tablet Take 1 tablet (5 mg total) by mouth 2 (two) times daily. 60 tablet 3  . benzonatate (TESSALON) 100 MG capsule Take 100 mg by mouth 3 (three) times daily as needed for cough.    . capecitabine (XELODA) 500 MG tablet Take 5 tablets (2,500 mg total) by mouth 2 (two) times daily after a meal. Take for 14 days, then hold for 7 days. Repeat every 21 days. (Patient taking differently: Take 2,000 mg/m2 by mouth 2 (two) times daily after a meal.) 140 tablet 0  . chlorpheniramine-HYDROcodone (TUSSIONEX) 10-8 MG/5ML SUER SMARTSIG:1 Teaspoon By Mouth Twice Daily 480 mL 0  . ferrous sulfate 325 (65 FE) MG tablet Take 325 mg by mouth daily with breakfast.    . hydrochlorothiazide (HYDRODIURIL) 25 MG tablet Take 25 mg by mouth daily.    Marland Kitchen HYDROcodone-acetaminophen (NORCO/VICODIN) 5-325 MG tablet Take 1 tablet by mouth every 6 (six) hours as needed for moderate pain. 15 tablet 0  . HYDROmorphone (DILAUDID) 2 MG tablet Take 1 tablet (2 mg total) by mouth every 4 (four) hours as needed for severe pain. 30 tablet 0  . losartan (COZAAR) 50 MG tablet Take 50 mg by mouth daily.    . metFORMIN (GLUCOPHAGE) 500 MG tablet Take 500 mg by mouth daily with breakfast.     . pantoprazole (PROTONIX) 40 MG tablet Take 40 mg by mouth daily.    . potassium chloride (MICRO-K) 10 MEQ CR capsule Take 10 mEq by mouth daily.     . pravastatin (PRAVACHOL) 10 MG tablet Take 10 mg by mouth daily.     . prochlorperazine (COMPAZINE) 10 MG tablet Take 1 tablet (10 mg total) by mouth every 6 (six) hours as needed (Nausea or vomiting). 30 tablet 1  . gabapentin (NEURONTIN) 300 MG capsule Take 1 capsule (300 mg total) by mouth at bedtime. (Patient not taking:  Reported on 08/06/2020) 30 capsule 1  . ibuprofen (ADVIL,MOTRIN) 800 MG tablet Take 800 mg by mouth every 8 (eight) hours as needed for moderate pain.  (Patient not taking: Reported on 08/06/2020)    . oxybutynin (DITROPAN) 5 MG tablet Take 5 mg by mouth daily.  (Patient not taking: Reported on 08/06/2020)    . PACLitaxel (TAXOL IV) Inject 80 mg/m2 into the vein once a week. Day 1, 8, 15 every 28 days (Patient not taking: Reported on 08/06/2020)     No current facility-administered medications for this visit.    Allergies as of 08/06/2020  . (No Known Allergies)    Family History  Problem Relation Age of Onset  . Diabetes Mellitus II Father   . Congestive Heart Failure Father   . Cancer Mother        Deceased at age 13; patient not sure if cancer was in her stomach or colon  . Stroke Maternal Grandmother   . Cancer Cousin        female  reproductive cancer, dx. 50s/60s    Social History   Socioeconomic History  . Marital status: Married    Spouse name: Herbie Baltimore  . Number of children: 2  . Years of education: Not on file  . Highest education level: Not on file  Occupational History  . Occupation: retired  Tobacco Use  . Smoking status: Former Smoker    Types: Cigarettes  . Smokeless tobacco: Never Used  . Tobacco comment: quit about 40+ years ago  Vaping Use  . Vaping Use: Never used  Substance and Sexual Activity  . Alcohol use: Never  . Drug use: No  . Sexual activity: Not on file  Other Topics Concern  . Not on file  Social History Narrative  . Not on file   Social Determinants of Health   Financial Resource Strain: Low Risk   . Difficulty of Paying Living Expenses: Not hard at all  Food Insecurity: No Food Insecurity  . Worried About Charity fundraiser in the Last Year: Never true  . Ran Out of Food in the Last Year: Never true  Transportation Needs: No Transportation Needs  . Lack of Transportation (Medical): No  . Lack of Transportation (Non-Medical): No   Physical Activity: Inactive  . Days of Exercise per Week: 0 days  . Minutes of Exercise per Session: 0 min  Stress: No Stress Concern Present  . Feeling of Stress : Only a little  Social Connections: Moderately Integrated  . Frequency of Communication with Friends and Family: Three times a week  . Frequency of Social Gatherings with Friends and Family: Three times a week  . Attends Religious Services: 1 to 4 times per year  . Active Member of Clubs or Organizations: No  . Attends Archivist Meetings: Never  . Marital Status: Married    Review of Systems: Review of Systems  Constitutional: Negative for chills, fever, malaise/fatigue and weight loss.  HENT: Negative for congestion and sore throat.        Hoarseness  Respiratory: Negative for cough and shortness of breath.   Cardiovascular: Negative for chest pain and palpitations.  Gastrointestinal: Positive for blood in stool. Negative for abdominal pain, diarrhea, melena, nausea and vomiting.       Dark stools on iron  Musculoskeletal: Negative for joint pain and myalgias.  Skin: Negative for rash.  Neurological: Negative for dizziness and weakness.  Endo/Heme/Allergies: Does not bruise/bleed easily.  Psychiatric/Behavioral: Negative for depression. The patient is not nervous/anxious.   All other systems reviewed and are negative.   Physical Exam: Note: limited exam due to virtual visit There were no vitals taken for this visit. Physical Exam Nursing note reviewed.  Constitutional:      General: She is not in acute distress.    Comments: Patient sounds hoarse on phon. No obvious distress heard on phone visit.  HENT:     Nose: No congestion.     Comments: No obvious congestion heard on phone visit Pulmonary:     Effort: No respiratory distress.     Comments: No obvious respiratory distress heard on phone visit Neurological:     General: No focal deficit present.     Mental Status: She is alert and oriented to  person, place, and time.     Comments: No obvious speech focal deficit heard on phone visit  Psychiatric:        Attention and Perception: Attention normal.        Mood and Affect: Mood normal.  Speech: Speech normal.        Behavior: Behavior normal.        Thought Content: Thought content normal.        Cognition and Memory: Cognition and memory normal.       Assessment: Very pleasant 70 year old female who was referred by hematology/oncology for evaluation of concerns for possible ascending colon mass on CT.  Noted family history of colorectal cancer in her mother who passed away at age 54 from either colon cancer or stomach cancer (the patient cannot remember for sure).  She has also been having rectal bleeding.  Possible colon mass with rectal bleeding: Her constellation of symptoms is a bit concerning, especially given her family history of colon cancer.  She states she has not been having rectal bleeding since being started on Eliquis and chemotherapy.  She just had her CBC checked about a week ago and while she remains anemic, it is improved compared to her last several checks.  Her hemoglobin last week was 10.2 (recent baseline in the 9 range).  Denies any significant symptoms suggestive of severe anemia.  She has never had a colonoscopy before.  We will need to evaluate endoscopically for further recommendations.   Proceed with colonoscopy by Dr. Gala Romney in near future: the risks, benefits, and alternatives have been discussed with the patient in detail. The patient states understanding and desires to proceed.  The patient is currently on Eliquis, iron, hydrocodone, hydromorphone, Glucophage, Neurontin.  We will make appropriate adjustments to Glucophage, hold iron for 1 week prior to procedure, request clearance to hold Eliquis for 48 hours prior to procedure from oncology.  ASA III/IV    Plan:  1. Colonoscopy as described above 2. Notify us of any large-volume bleeding  or symptoms of severe anemia 3. Follow-up in 3 months 4. Further recommendations will follow     Thank you for allowing [place] to participate in the care of [place], DNP, AGNP-C Adult & Gerontological Nurse Practitioner [facility]

## 2020-08-06 NOTE — Telephone Encounter (Signed)
Noted routing message

## 2020-08-06 NOTE — Telephone Encounter (Signed)
Dr. Delton Coombes, Pt is due for a Colonoscopy with Dr. Gala Romney. Is it ok for pt to hold Eliquis 48 hours prior to procedure? Please advise.

## 2020-08-06 NOTE — Telephone Encounter (Signed)
Spouse returned call. She has been scheduled for 4/4, am appt. Aware will mail prep instructions with pre-op/covid test appt. He advised to send Rx to [facility] [address]. They want a prep that patient does not have to use an enema. Rx for suprep sent in. Confirmed mailing address.

## 2020-08-06 NOTE — Telephone Encounter (Signed)
Noted  

## 2020-08-06 NOTE — Telephone Encounter (Signed)
NOTED. Patient already scheduled and instructions sent

## 2020-08-06 NOTE — Patient Instructions (Signed)
Your health issues we discussed today were:   Rectal bleeding, abnormal CT scan, anemia, family history of colon cancer: 1. Because of all of these, we will plan to schedule you for colonoscopy soon as we can 2. Further recommendations will follow your colonoscopy 3. Notify us if you have any episodes of large amounts of bleeding 4. Notify us if you have any symptoms of severe blood loss such as worsening weakness, fatigue, dizziness, passing out, chest pain, etc. 5. Alternatively, you could proceed to the emergency room for any of the symptoms 6. Call us for any other GI concerns in the meantime  Overall I recommend:  1. Continue other current medications 2. Return for follow-up in 3 months 3. Call us for any questions or concerns   ---------------------------------------------------------------  I am glad you have gotten your COVID-19 vaccination!  Even though you are fully vaccinated you should continue to follow CDC and state/local guidelines.  ---------------------------------------------------------------   At [facility] we value your feedback. You may receive a survey about your visit today. Please share your experience as we strive to create trusting relationships with our patients to provide genuine, compassionate, quality care.  We appreciate your understanding and patience as we review any laboratory studies, imaging, and other diagnostic tests that are ordered as we care for you. Our office policy is 5 business days for review of these results, and any emergent or urgent results are addressed in a timely manner for your best interest. If you do not hear from our office in 1 week, please contact us.   We also encourage the use of MyChart, which contains your medical information for your review as well. If you are not enrolled in this feature, an access code is on this after visit summary for your convenience. Thank you for allowing [place] to be involved in your  care.  It was great to see you today!  I hope you have a great spring!!

## 2020-08-06 NOTE — Progress Notes (Signed)
Cc'ed to pcp °

## 2020-08-06 NOTE — Telephone Encounter (Signed)
Called pt, LMOVM to call back to schedule TCS with Dr. Gala Romney, asa 3, propofol

## 2020-08-18 ENCOUNTER — Inpatient Hospital Stay (HOSPITAL_COMMUNITY): Payer: Medicare HMO | Attending: Hematology | Admitting: Hematology

## 2020-08-18 ENCOUNTER — Encounter (HOSPITAL_COMMUNITY): Payer: Self-pay | Admitting: Hematology

## 2020-08-18 ENCOUNTER — Other Ambulatory Visit: Payer: Self-pay

## 2020-08-18 ENCOUNTER — Inpatient Hospital Stay (HOSPITAL_COMMUNITY): Payer: Medicare HMO

## 2020-08-18 VITALS — BP 131/80 | HR 102 | Temp 96.9°F | Resp 20

## 2020-08-18 DIAGNOSIS — R0602 Shortness of breath: Secondary | ICD-10-CM | POA: Diagnosis not present

## 2020-08-18 DIAGNOSIS — C778 Secondary and unspecified malignant neoplasm of lymph nodes of multiple regions: Secondary | ICD-10-CM | POA: Diagnosis not present

## 2020-08-18 DIAGNOSIS — N939 Abnormal uterine and vaginal bleeding, unspecified: Secondary | ICD-10-CM | POA: Diagnosis not present

## 2020-08-18 DIAGNOSIS — Z7901 Long term (current) use of anticoagulants: Secondary | ICD-10-CM | POA: Diagnosis not present

## 2020-08-18 DIAGNOSIS — Z79899 Other long term (current) drug therapy: Secondary | ICD-10-CM | POA: Diagnosis not present

## 2020-08-18 DIAGNOSIS — Z86718 Personal history of other venous thrombosis and embolism: Secondary | ICD-10-CM | POA: Diagnosis not present

## 2020-08-18 DIAGNOSIS — R490 Dysphonia: Secondary | ICD-10-CM | POA: Diagnosis not present

## 2020-08-18 DIAGNOSIS — G893 Neoplasm related pain (acute) (chronic): Secondary | ICD-10-CM | POA: Diagnosis not present

## 2020-08-18 DIAGNOSIS — C787 Secondary malignant neoplasm of liver and intrahepatic bile duct: Secondary | ICD-10-CM | POA: Insufficient documentation

## 2020-08-18 DIAGNOSIS — G620 Drug-induced polyneuropathy: Secondary | ICD-10-CM | POA: Insufficient documentation

## 2020-08-18 DIAGNOSIS — C50812 Malignant neoplasm of overlapping sites of left female breast: Secondary | ICD-10-CM | POA: Insufficient documentation

## 2020-08-18 DIAGNOSIS — Z7984 Long term (current) use of oral hypoglycemic drugs: Secondary | ICD-10-CM | POA: Diagnosis not present

## 2020-08-18 DIAGNOSIS — Z170 Estrogen receptor positive status [ER+]: Secondary | ICD-10-CM | POA: Insufficient documentation

## 2020-08-18 DIAGNOSIS — M791 Myalgia, unspecified site: Secondary | ICD-10-CM | POA: Insufficient documentation

## 2020-08-18 DIAGNOSIS — Z87891 Personal history of nicotine dependence: Secondary | ICD-10-CM | POA: Diagnosis not present

## 2020-08-18 DIAGNOSIS — E1142 Type 2 diabetes mellitus with diabetic polyneuropathy: Secondary | ICD-10-CM | POA: Insufficient documentation

## 2020-08-18 DIAGNOSIS — Z95828 Presence of other vascular implants and grafts: Secondary | ICD-10-CM

## 2020-08-18 DIAGNOSIS — G479 Sleep disorder, unspecified: Secondary | ICD-10-CM | POA: Diagnosis not present

## 2020-08-18 DIAGNOSIS — I10 Essential (primary) hypertension: Secondary | ICD-10-CM | POA: Insufficient documentation

## 2020-08-18 DIAGNOSIS — F32A Depression, unspecified: Secondary | ICD-10-CM | POA: Insufficient documentation

## 2020-08-18 LAB — COMPREHENSIVE METABOLIC PANEL
ALT: 17 U/L (ref 0–44)
AST: 31 U/L (ref 15–41)
Albumin: 3.5 g/dL (ref 3.5–5.0)
Alkaline Phosphatase: 65 U/L (ref 38–126)
Anion gap: 9 (ref 5–15)
BUN: 19 mg/dL (ref 8–23)
CO2: 23 mmol/L (ref 22–32)
Calcium: 8.7 mg/dL — ABNORMAL LOW (ref 8.9–10.3)
Chloride: 103 mmol/L (ref 98–111)
Creatinine, Ser: 0.73 mg/dL (ref 0.44–1.00)
GFR, Estimated: >60 mL/min (ref >60)
Glucose, Bld: 123 mg/dL — ABNORMAL HIGH (ref 70–99)
Potassium: 3.4 mmol/L — ABNORMAL LOW (ref 3.5–5.1)
Sodium: 135 mmol/L (ref 135–145)
Total Bilirubin: 0.5 mg/dL (ref 0.3–1.2)
Total Protein: 7.4 g/dL (ref 6.5–8.1)

## 2020-08-18 LAB — CBC WITH DIFFERENTIAL/PLATELET
Abs Immature Granulocytes: 0.03 10*3/uL (ref 0.00–0.07)
Basophils Absolute: 0 10*3/uL (ref 0.0–0.1)
Basophils Relative: 1 %
Eosinophils Absolute: 0.2 10*3/uL (ref 0.0–0.5)
Eosinophils Relative: 3 %
HCT: 30.8 % — ABNORMAL LOW (ref 36.0–46.0)
Hemoglobin: 10 g/dL — ABNORMAL LOW (ref 12.0–15.0)
Immature Granulocytes: 0 %
Lymphocytes Relative: 30 %
Lymphs Abs: 2.1 10*3/uL (ref 0.7–4.0)
MCH: 29.9 pg (ref 26.0–34.0)
MCHC: 32.5 g/dL (ref 30.0–36.0)
MCV: 91.9 fL (ref 80.0–100.0)
Monocytes Absolute: 0.8 10*3/uL (ref 0.1–1.0)
Monocytes Relative: 12 %
Neutro Abs: 3.7 10*3/uL (ref 1.7–7.7)
Neutrophils Relative %: 54 %
Platelets: 248 10*3/uL (ref 150–400)
RBC: 3.35 MIL/uL — ABNORMAL LOW (ref 3.87–5.11)
RDW: 19.9 % — ABNORMAL HIGH (ref 11.5–15.5)
WBC: 6.8 10*3/uL (ref 4.0–10.5)
nRBC: 0 % (ref 0.0–0.2)

## 2020-08-18 LAB — MAGNESIUM: Magnesium: 1.7 mg/dL (ref 1.7–2.4)

## 2020-08-18 MED ORDER — SODIUM CHLORIDE 0.9% FLUSH
10.0000 mL | Freq: Once | INTRAVENOUS | Status: AC
  Administered 2020-08-18: 10 mL via INTRAVENOUS

## 2020-08-18 MED ORDER — HEPARIN SOD (PORK) LOCK FLUSH 100 UNIT/ML IV SOLN
500.0000 [IU] | Freq: Once | INTRAVENOUS | Status: AC
  Administered 2020-08-18: 500 [IU] via INTRAVENOUS

## 2020-08-18 NOTE — Progress Notes (Signed)
[place], [place] [address]   CLINIC:  Medical Oncology/Hematology  PCP:  Lemmie Evens, MD [place] / [place] [address] [phone]   REASON FOR VISIT:  Follow-up for left breast cancer  PRIOR THERAPY: Paclitaxel x 6 cycles from 12/31/2019 to 06/02/2020  NGS Results: ER positive, PR/HER-2 negative  CURRENT THERAPY: XRT with Xeloda 2,000 mg BID 2/3 weeks  BRIEF ONCOLOGIC HISTORY:  Oncology History  Malignant neoplasm of overlapping sites of left breast in female, estrogen receptor positive ([place])  11/16/2019 Cancer Staging   Staging form: Breast, AJCC 8th Edition - Clinical stage from 11/16/2019: Stage IV (cT4b, cN1, cM1, G3, ER+, PR-, HER2-) - Signed by [name], MD on 12/17/2019   11/28/2019 Initial Diagnosis   Patient noted intermittent left breast pain and swelling for several months. Mammogram showed a 6.2cm mass in the left breast axillary tail with an adjacent 0.9cm mass, 2 abnormal left axillary lymph nodes, 1 abnormal right axillary lymph node, and diffuse left breast edema and skin thickening. Biopsy showed IDC, grade 3 in the left breast and bilateral axillas, HER-2 negative (1+), ER+ 100%, PR- 0%, Ki67 30%.    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 female, returns for routine follow-up of her left breast cancer. [name] was last seen on 07/28/2020.   Today she is accompanied by her husband and she reports feeling fair. Her toes continue being numb and her voice continues being hoarse but denies pealing of the skin of her hand. She complains of her left hand being constantly contracted and hurts to exercise the hand with the ball and to extend the fingers. She denies having N/V/D. Her appetite is excellent but her energy levels are completely gone. She continues having bright red blood with her BM's and occasional vaginal bleeding with small clots at times. She has 4 more doses of Xeloda and her voice improves when she is on her off week.  She is scheduled to have a colonoscopy with Dr. Gala Romney on 04/04.   REVIEW OF SYSTEMS:  Review of Systems  Constitutional: Positive for fatigue (depleted). Negative for appetite change.  Respiratory: Positive for shortness of breath.   Cardiovascular: Positive for chest pain (2/10 dull L breast pain).  Gastrointestinal: Positive for blood in stool (occasional BRBPR w/ BM's). Negative for diarrhea, nausea and vomiting.  Genitourinary: Positive for vaginal bleeding (occasional clots).   Musculoskeletal: Positive for myalgias (L hand staying curled up).  Neurological: Positive for numbness (toes).  Psychiatric/Behavioral:  Positive for depression and sleep disturbance.  All other systems reviewed and are negative.   PAST MEDICAL/SURGICAL HISTORY:  Past Medical History:  Diagnosis Date  . Anemia   . Arthritis    per patient " left knee"  . Essential hypertension, benign   . Family history of cancer of female genital  organ   . Family history of GI tract cancer   . Metastatic breast cancer ([place])    left breast  . Port-A-Cath in place 12/25/2019  . Type 2 diabetes mellitus ([place])    Past Surgical History:  Procedure Laterality Date  . CATARACT EXTRACTION W/ INTRAOCULAR LENS IMPLANT Right   . HYSTEROSCOPY WITH D & C N/A 06/05/2014   Procedure: DILATATION AND CURETTAGE /HYSTEROSCOPY;  Surgeon: Florian Buff, MD;  Location: [facility];  Service: Gynecology;  Laterality: N/A;  . POLYPECTOMY N/A 06/05/2014   Procedure: ENDOMETRIAL POLYPECTOMY;  Surgeon: Florian Buff, MD;  Location: [facility];  Service: Gynecology;  Laterality: N/A;  . PORTACATH PLACEMENT N/A 12/14/2019   Procedure: INSERTION PORT-A-CATH WITH ULTRASOUND GUIDANCE;  Surgeon: Donnie Mesa, MD;  Location: [facility];  Service: General;  Laterality: N/A;  . TUBAL LIGATION      SOCIAL HISTORY:  Social History   Socioeconomic History  . Marital status: Married    Spouse name: Herbie Baltimore  . Number of children: 2  . Years of education: Not on file  . Highest education level: Not on file  Occupational History  . Occupation: retired  Tobacco Use  . Smoking status: Former Smoker    Types: Cigarettes  . Smokeless tobacco: Never Used  . Tobacco comment: quit about 40+ years ago  Vaping Use  . Vaping Use: Never used  Substance and Sexual Activity  . Alcohol use: Never  . Drug use: No  . Sexual activity: Not on file  Other Topics Concern  . Not on file  Social History Narrative  . Not on file   Social Determinants of Health   Financial Resource Strain: Low Risk   . Difficulty of Paying Living Expenses: Not hard at all  Food Insecurity: No Food Insecurity  . Worried About Charity fundraiser in the Last Year: Never true  . Ran Out of Food in the Last Year: Never true  Transportation Needs: No Transportation Needs  . Lack of Transportation (Medical): No  . Lack of Transportation (Non-Medical): No  Physical Activity: Inactive  . Days of Exercise per  Week: 0 days  . Minutes of Exercise per Session: 0 min  Stress: No Stress Concern Present  . Feeling of Stress : Only a little  Social Connections: Moderately Integrated  . Frequency of Communication with Friends and Family: Three times a week  . Frequency of Social Gatherings with Friends and Family: Three times a week  . Attends Religious Services: 1 to 4 times per year  . Active Member of Clubs or Organizations: No  . Attends Archivist Meetings: Never  . Marital Status: Married  Human resources officer Violence: Not At Risk  . Fear of Current or Ex-Partner: No  . Emotionally Abused: No  . Physically Abused: No  . Sexually Abused: No    FAMILY HISTORY:  Family History  Problem Relation Age of Onset  . Diabetes Mellitus II Father   . Congestive Heart Failure Father   . Colon cancer Mother 65       patient not sure if colon vs stomach  . Stroke Maternal Grandmother   .  Cancer Cousin        female reproductive cancer, dx. 50s/60s    CURRENT MEDICATIONS:  Current Outpatient Medications  Medication Sig Dispense Refill  . apixaban (ELIQUIS) 5 MG TABS tablet Take 1 tablet (5 mg total) by mouth 2 (two) times daily. 60 tablet 3  . benzonatate (TESSALON) 100 MG capsule Take 100 mg by mouth 3 (three) times daily as needed for cough.    . capecitabine (XELODA) 500 MG tablet Take 5 tablets (2,500 mg total) by mouth 2 (two) times daily after a meal. Take for 14 days, then hold for 7 days. Repeat every 21 days. (Patient taking differently: Take 2,000 mg/m2 by mouth 2 (two) times daily after a meal.) 140 tablet 0  . chlorpheniramine-HYDROcodone (TUSSIONEX) 10-8 MG/5ML SUER SMARTSIG:1 Teaspoon By Mouth Twice Daily 480 mL 0  . ferrous sulfate 325 (65 FE) MG tablet Take 325 mg by mouth daily with breakfast.    . hydrochlorothiazide (HYDRODIURIL) 25 MG tablet Take 25 mg by mouth daily.    Marland Kitchen HYDROcodone-acetaminophen (NORCO/VICODIN) 5-325 MG tablet Take 1 tablet by mouth every 6 (six) hours  as needed for moderate pain. 15 tablet 0  . HYDROmorphone (DILAUDID) 2 MG tablet Take 1 tablet (2 mg total) by mouth every 4 (four) hours as needed for severe pain. 30 tablet 0  . losartan (COZAAR) 50 MG tablet Take 50 mg by mouth daily.    . metFORMIN (GLUCOPHAGE) 500 MG tablet Take 500 mg by mouth daily with breakfast.     . pantoprazole (PROTONIX) 40 MG tablet Take 40 mg by mouth daily.    . potassium chloride (MICRO-K) 10 MEQ CR capsule Take 10 mEq by mouth daily.     . pravastatin (PRAVACHOL) 10 MG tablet Take 10 mg by mouth daily.     . prochlorperazine (COMPAZINE) 10 MG tablet Take 1 tablet (10 mg total) by mouth every 6 (six) hours as needed (Nausea or vomiting). 30 tablet 1   No current facility-administered medications for this visit.    ALLERGIES:  No Known Allergies  PHYSICAL EXAM:  Performance status (ECOG): 1 - Symptomatic but completely ambulatory  Vitals:   08/18/20 1142  BP: 131/80  Pulse: (!) 102  Resp: 20  Temp: (!) 96.9 F (36.1 C)  SpO2: 96%   Wt Readings from Last 3 Encounters:  08/18/20 288 lb 12.8 oz (131 kg)  07/28/20 291 lb 3.2 oz (132.1 kg)  07/09/20 293 lb (132.9 kg)   Physical Exam Vitals reviewed.  Constitutional:      Appearance: Normal appearance. She is obese.     Comments: In wheelchair  HENT:     Mouth/Throat:     Lips: No lesions.     Mouth: No oral lesions.     Dentition: No gum lesions.     Tongue: No lesions.  Cardiovascular:     Rate and Rhythm: Normal rate and regular rhythm.     Pulses: Normal pulses.     Heart sounds: Normal heart sounds.  Pulmonary:     Effort: Pulmonary effort is normal.     Breath sounds: Normal breath sounds.  Chest:  Breasts:     Left: Supraclavicular adenopathy present. No axillary adenopathy.    Musculoskeletal:     Left upper arm: Swelling present.     Left forearm: Swelling present.     Left hand: Decreased range of motion (fingers staying curled).  Lymphadenopathy:     Upper Body:      Left upper  body: Supraclavicular adenopathy present. No axillary or pectoral adenopathy.  Neurological:     General: No focal deficit present.     Mental Status: She is alert and oriented to person, place, and time.  Psychiatric:        Mood and Affect: Mood normal.        Behavior: Behavior normal.      LABORATORY DATA:  I have reviewed the labs as listed.  CBC Latest Ref Rng & Units 08/18/2020 07/28/2020 07/09/2020  WBC 4.0 - 10.5 K/uL 6.8 4.9 6.6  Hemoglobin 12.0 - 15.0 g/dL 10.0(L) 10.2(L) 9.6(L)  Hematocrit 36.0 - 46.0 % 30.8(L) 33.9(L) 32.8(L)  Platelets 150 - 400 K/uL 248 238 292   CMP Latest Ref Rng & Units 08/18/2020 07/28/2020 07/09/2020  Glucose 70 - 99 mg/dL 123(H) 152(H) 132(H)  BUN 8 - 23 mg/dL 19 18 20   Creatinine 0.44 - 1.00 mg/dL 0.73 0.73 0.79  Sodium 135 - 145 mmol/L 135 136 135  Potassium 3.5 - 5.1 mmol/L 3.4(L) 3.4(L) 3.5  Chloride 98 - 111 mmol/L 103 100 102  CO2 22 - 32 mmol/L 23 26 26   Calcium 8.9 - 10.3 mg/dL 8.7(L) 8.8(L) 8.9  Total Protein 6.5 - 8.1 g/dL 7.4 6.9 7.6  Total Bilirubin 0.3 - 1.2 mg/dL 0.5 0.5 0.5  Alkaline Phos 38 - 126 U/L 65 67 70  AST 15 - 41 U/L 31 35 32  ALT 0 - 44 U/L 17 21 17     DIAGNOSTIC IMAGING:  I have independently reviewed the scans and discussed with the patient. DG Chest 2 View  Result Date: 07/28/2020 CLINICAL DATA:  70 year old female with history of worsening productive cough for the past 2-3 weeks. Shortness of breath. History of breast cancer. EXAM: CHEST - 2 VIEW COMPARISON:  Chest x-ray 12/14/2019. FINDINGS: Right single-lumen porta cath with tip terminating in the superior aspect of the right atrium. Chronic elevation of the right hemidiaphragm. Lung volumes are normal. No consolidative airspace disease. No pleural effusions. No pneumothorax. No pulmonary nodule or mass noted. Pulmonary vasculature and the cardiomediastinal silhouette are within normal limits. IMPRESSION: 1. No radiographic evidence of acute cardiopulmonary  disease. 2. Chronic elevation of the right hemidiaphragm. Electronically Signed   By: Vinnie Langton M.D.   On: 07/28/2020 13:11     ASSESSMENT:  1. 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 -MRI of the breast on 12/10/2019 shows left breast malignancy involving all 4 quadrants and skin of the left breast including 0.5 cm biopsy-proven malignancy within the far posterior outer right breast, abnormal highly suspicious nonmuscle like enhancement within all 4 quadrants of the left breast and diffuse skin thickening. This abnormal non-mass-like enhancement and skin thickening extends across the midline to the medial right breast. At least 7 abnormal left axillary lymph nodes and 1 abnormal right axillary lymph node compatible with metastatic disease. -CT CAP on 12/12/2019 shows bulky lymphadenopathy in the left subpectoral and axillary regions with largest lymph node measuring 3.3 cm. Mild mediastinal adenopathy in the right paratracheal prevascular and lateral aortic regions, largest lymph node in the lateral aortic region measuring 2.4 cm. Lymphadenopathy in the inferior jugular and supraclavicular regions, left side greater than right. Mild lymphadenopathy in the right axilla. -Multiple small hypervascular lesions seen in the right and left lobesconsistent with liver metastasis. -Bone scan  on 12/12/2019 did not show any bone metastasis. -CT CAP on 12/29/2019 showed possible lytic lesion in the T4 vertebral body. -Liver biopsy on 12/28/2019 consistent with metastatic breast cancer. -Paclitaxel weekly 3 weeks on 1 week off started on 12/31/2019. -CT CAP on 03/20/2020 shows mild decrease in the left axillary, subpectoral, supraclavicular, mediastinal and lower jugular lymphadenopathy. Stable small  low-attenuation liver lesion. No new progressive metastatic disease. Stable lesion in the left sternal manubrium and T4 vertebral body. Stable short segment concentric wall thickening of the right colon with subcentimeter pericolonic lymph nodes. We will monitor this finding as she has metastatic breast cancer. She is asymptomatic. -CT CAP on 06/18/1998 6:22 cycles of chemotherapy showed no new or progressive metastatic disease. Similar size of the index and nonindex left axillary subpectoral, supraclavicular and lower jugular lymphadenopathy.  2. Right colonic mass: -CT scan showed constricting mass involving the ascending colon measuring 3.6 x 3.5 cm with a small 1 cm right pericolonic lymph node. -She never had a colonoscopy. Denies any bleeding per rectum or melena.  3. Left leg DVT: -Doppler on 03/03/2020 shows left femoral vein, popliteal vein and posterior tibial vein deep vein thrombosis.   PLAN:  1. Metastatic left breast cancerto the liver: -Xeloda 1000 mg twice daily started on 07/17/2020, 2 weeks on 1 week off. -She is tolerating Xeloda very well. -No clinical signs or symptoms of hand-foot skin reaction. -Reviewed labs from today which showed normal LFTs and white count and platelet count. -She reports occasional vaginal bleeding.  We have done UA at last visit which showed some hemoglobin without sign of infection.  She was recommended to follow-up with GYN. -She has contracture of the fingers of the left hand from involvement of the neurovascular bundle by lymphadenopathy in the axillary region.  Denies any major pain in the left breast region.  Lymphadenopathy in the left neck region is stable. -She also reports hoarseness since Xeloda was started which improved spontaneously on some days.  If there is any worsening will consider ENT evaluation. -Recommend follow-up in 1 month with repeat CT CAP and CA 15-3.  2. Right colonic mass: -She continues to have occasional  rectal bleeding.  She has follow-up with Dr. Sydell Axon on 09/01/2020 for colonoscopy.  3.Peripheral neuropathy: -Neuropathy in the fingertips has improved after paclitaxel discontinued.  4.Left leg DVT: -Continue Eliquis twice daily with no bleeding issues.   Orders placed this encounter:  Orders Placed This Encounter  Procedures  . CT CHEST ABDOMEN PELVIS W CONTRAST     [name], MD [place] [phone]   I, Milinda Antis, am acting as a scribe for Dr. Sanda Linger.  I, [name] MD, have reviewed the above documentation for accuracy and completeness, and I agree with the above.

## 2020-08-18 NOTE — Patient Instructions (Addendum)
[place] at [facility] Discharge Instructions  You were seen today by Dr. Delton Coombes. He went over your recent results and scans. You will be referred to your OB/GYN for your vaginal bleeding. You will be scheduled to have a CT scan of your chest and abdomen before your next visit. Dr. Delton Coombes will see you back in 1 month for labs and follow up.   Thank you for choosing [place] at [facility] to provide your oncology and hematology care.  To afford each patient quality time with our provider, please arrive at least 15 minutes before your scheduled appointment time.   If you have a lab appointment with the [facility] please come in thru the Main Entrance and check in at the main information desk  You need to re-schedule your appointment should you arrive 10 or more minutes late.  We strive to give you quality time with our providers, and arriving late affects you and other patients whose appointments are after yours.  Also, if you no show three or more times for appointments you may be dismissed from the clinic at the providers discretion.     Again, thank you for choosing [facility].  Our hope is that these requests will decrease the amount of time that you wait before being seen by our physicians.       _____________________________________________________________  Should you have questions after your visit to [facility], please contact our office at [phone] between the hours of 8:00 a.m. and 4:30 p.m.  Voicemails left after 4:00 p.m. will not be returned until the following business day.  For prescription refill requests, have your pharmacy contact our office and allow 72 hours.    Cancer Center Support Programs:   > Cancer Support Group  2nd Tuesday of the month 1pm-2pm, Journey Room

## 2020-08-18 NOTE — Addendum Note (Signed)
Addended by: Thurnell Garbe on: 08/18/2020 12:06 PM   Modules accepted: Orders

## 2020-08-27 ENCOUNTER — Telehealth: Payer: Self-pay | Admitting: Internal Medicine

## 2020-08-27 NOTE — Telephone Encounter (Signed)
Pt called to say that she fell and can hardly walk. She wants to reschedule her procedure with Dr Gala Romney on 09/01/2020. [phone]

## 2020-08-27 NOTE — Telephone Encounter (Signed)
Spoke with pt. She states she had a fall and unable to walk so she is not able to complete procedure on Monday. She is aware currently have no available openings and will call with Dr. Roseanne Kaufman June schedule. She voiced understanding. FYI to [employer].

## 2020-08-27 NOTE — Patient Instructions (Signed)
ROSANNA BICKLE  08/27/2020     @PREFPERIOPPHARMACY @   Your procedure is scheduled on  09/01/2020   Report to [facility] at  [place].M.   Call this number if you have problems the morning of surgery:  [phone]   Remember:  Follow the diet and prep instructions given to you by the office.                     Take these medicines the morning of surgery with A SIP OF WATER  Hydrocodone or dilaudid (if needed), protonix, compazine (if needed).  DO NOT take any medications for diabetes the morning of your procedure.  If your glucose is 70 or below the morning of your procedure, drink 1/2 cup of clear juice and recheck your glucose in 15 minutes. If your glucose is still 70 or below call [phone] for instructions.  If your glucose is 300 or above the morning of your procedure, call [phone] for instructions.     Please brush your teeth.  Do not wear jewelry, make-up or nail polish.  Do not wear lotions, powders, or perfumes, or deodorant.  Do not shave 48 hours prior to surgery.  Men may shave face and neck.  Do not bring valuables to the hospital.  [facility] is not responsible for any belongings or valuables.   Contacts, dentures or bridgework may not be worn into surgery.  Leave your suitcase in the car.  After surgery it may be brought to your room.  For patients admitted to the hospital, discharge time will be determined by your treatment team.  Patients discharged the day of surgery will not be allowed to drive home and must have someone with them for 24 hours.    Special instructions:  DO NOT smoke tobacco or vape the morning of your procedure.   Please read over the following fact sheets that you were given. Anesthesia Post-op Instructions and Care and Recovery After Surgery       Colonoscopy, Adult, Care After This sheet gives you information about how to care for yourself after your procedure. Your health care provider may also give you  more specific instructions. If you have problems or questions, contact your health care provider. What can I expect after the procedure? After the procedure, it is common to have:  A small amount of blood in your stool for 24 hours after the procedure.  Some gas.  Mild cramping or bloating of your abdomen. Follow these instructions at home: Eating and drinking  Drink enough fluid to keep your urine pale yellow.  Follow instructions from your health care provider about eating or drinking restrictions.  Resume your normal diet as instructed by your health care provider. Avoid heavy or fried foods that are hard to digest.   Activity  Rest as told by your health care provider.  Avoid sitting for a long time without moving. Get up to take short walks every 1-2 hours. This is important to improve blood flow and breathing. Ask for help if you feel weak or unsteady.  Return to your normal activities as told by your health care provider. Ask your health care provider what activities are safe for you. Managing cramping and bloating  Try walking around when you have cramps or feel bloated.  Apply heat to your abdomen as told by your health care provider. Use the heat source that your health care provider recommends, such as a  moist heat pack or a heating pad. ? Place a towel between your skin and the heat source. ? Leave the heat on for 20-30 minutes. ? Remove the heat if your skin turns bright red. This is especially important if you are unable to feel pain, heat, or cold. You may have a greater risk of getting burned.   General instructions  If you were given a sedative during the procedure, it can affect you for several hours. Do not drive or operate machinery until your health care provider says that it is safe.  For the first 24 hours after the procedure: ? Do not sign important documents. ? Do not drink alcohol. ? Do your regular daily activities at a slower pace than normal. ? Eat  soft foods that are easy to digest.  Take over-the-counter and prescription medicines only as told by your health care provider.  Keep all follow-up visits as told by your health care provider. This is important. Contact a health care provider if:  You have blood in your stool 2-3 days after the procedure. Get help right away if you have:  More than a small spotting of blood in your stool.  Large blood clots in your stool.  Swelling of your abdomen.  Nausea or vomiting.  A fever.  Increasing pain in your abdomen that is not relieved with medicine. Summary  After the procedure, it is common to have a small amount of blood in your stool. You may also have mild cramping and bloating of your abdomen.  If you were given a sedative during the procedure, it can affect you for several hours. Do not drive or operate machinery until your health care provider says that it is safe.  Get help right away if you have a lot of blood in your stool, nausea or vomiting, a fever, or increased pain in your abdomen. This information is not intended to replace advice given to you by your health care provider. Make sure you discuss any questions you have with your health care provider. Document Revised: 05/11/2019 Document Reviewed: 12/11/2018 Elsevier Patient Education  2021 [employer] After This sheet gives you information about how to care for yourself after your procedure. Your health care provider may also give you more specific instructions. If you have problems or questions, contact your health care provider. What can I expect after the procedure? After the procedure, it is common to have:  Tiredness.  Forgetfulness about what happened after the procedure.  Impaired judgment for important decisions.  Nausea or vomiting.  Some difficulty with balance. Follow these instructions at home: For the time period you were told by your health care  provider:  Rest as needed.  Do not participate in activities where you could fall or become injured.  Do not drive or use machinery.  Do not drink alcohol.  Do not take sleeping pills or medicines that cause drowsiness.  Do not make important decisions or sign legal documents.  Do not take care of children on your own.      Eating and drinking  Follow the diet that is recommended by your health care provider.  Drink enough fluid to keep your urine pale yellow.  If you vomit: ? Drink water, juice, or soup when you can drink without vomiting. ? Make sure you have little or no nausea before eating solid foods. General instructions  Have a responsible adult stay with you for the time you are told. It is  important to have someone help care for you until you are awake and alert.  Take over-the-counter and prescription medicines only as told by your health care provider.  If you have sleep apnea, surgery and certain medicines can increase your risk for breathing problems. Follow instructions from your health care provider about wearing your sleep device: ? Anytime you are sleeping, including during daytime naps. ? While taking prescription pain medicines, sleeping medicines, or medicines that make you drowsy.  Avoid smoking.  Keep all follow-up visits as told by your health care provider. This is important. Contact a health care provider if:  You keep feeling nauseous or you keep vomiting.  You feel light-headed.  You are still sleepy or having trouble with balance after 24 hours.  You develop a rash.  You have a fever.  You have redness or swelling around the IV site. Get help right away if:  You have trouble breathing.  You have new-onset confusion at home. Summary  For several hours after your procedure, you may feel tired. You may also be forgetful and have poor judgment.  Have a responsible adult stay with you for the time you are told. It is important to  have someone help care for you until you are awake and alert.  Rest as told. Do not drive or operate machinery. Do not drink alcohol or take sleeping pills.  Get help right away if you have trouble breathing, or if you suddenly become confused. This information is not intended to replace advice given to you by your health care provider. Make sure you discuss any questions you have with your health care provider. Document Revised: 01/31/2020 Document Reviewed: 04/19/2019 Elsevier Patient Education  2021 [employer].

## 2020-08-27 NOTE — Telephone Encounter (Signed)
Called pt, LMOVM to call back. 

## 2020-08-27 NOTE — Telephone Encounter (Signed)
Noted, sorry to hear. I hope she gets better soon.

## 2020-08-28 ENCOUNTER — Encounter (HOSPITAL_COMMUNITY)
Admission: RE | Admit: 2020-08-28 | Discharge: 2020-08-28 | Disposition: A | Discharge status: Home / Self Care | Payer: Medicare HMO | Source: Ambulatory Visit | Attending: Internal Medicine | Admitting: Internal Medicine

## 2020-08-28 ENCOUNTER — Other Ambulatory Visit (HOSPITAL_COMMUNITY): Payer: Medicare HMO

## 2020-08-28 ENCOUNTER — Encounter (HOSPITAL_COMMUNITY): Payer: Self-pay

## 2020-09-01 ENCOUNTER — Ambulatory Visit (HOSPITAL_COMMUNITY): Admission: RE | Admit: 2020-09-01 | Payer: Medicare HMO | Source: Ambulatory Visit | Admitting: Internal Medicine

## 2020-09-01 ENCOUNTER — Encounter (HOSPITAL_COMMUNITY): Admission: RE | Payer: Self-pay | Source: Ambulatory Visit

## 2020-09-01 SURGERY — COLONOSCOPY WITH PROPOFOL
Anesthesia: Monitor Anesthesia Care

## 2020-09-08 ENCOUNTER — Encounter: Payer: Self-pay | Admitting: Internal Medicine

## 2020-09-10 ENCOUNTER — Inpatient Hospital Stay (HOSPITAL_COMMUNITY): Payer: Medicare HMO

## 2020-09-11 ENCOUNTER — Inpatient Hospital Stay (HOSPITAL_COMMUNITY): Payer: Medicare HMO | Attending: Hematology

## 2020-09-11 ENCOUNTER — Other Ambulatory Visit: Payer: Self-pay

## 2020-09-11 DIAGNOSIS — C50812 Malignant neoplasm of overlapping sites of left female breast: Secondary | ICD-10-CM | POA: Diagnosis present

## 2020-09-11 DIAGNOSIS — C787 Secondary malignant neoplasm of liver and intrahepatic bile duct: Secondary | ICD-10-CM | POA: Diagnosis not present

## 2020-09-11 DIAGNOSIS — Z170 Estrogen receptor positive status [ER+]: Secondary | ICD-10-CM | POA: Diagnosis not present

## 2020-09-11 DIAGNOSIS — C50611 Malignant neoplasm of axillary tail of right female breast: Secondary | ICD-10-CM | POA: Diagnosis present

## 2020-09-11 DIAGNOSIS — Z7901 Long term (current) use of anticoagulants: Secondary | ICD-10-CM | POA: Insufficient documentation

## 2020-09-11 DIAGNOSIS — Z86718 Personal history of other venous thrombosis and embolism: Secondary | ICD-10-CM | POA: Diagnosis not present

## 2020-09-11 DIAGNOSIS — Z9181 History of falling: Secondary | ICD-10-CM | POA: Diagnosis not present

## 2020-09-11 DIAGNOSIS — R319 Hematuria, unspecified: Secondary | ICD-10-CM | POA: Diagnosis not present

## 2020-09-11 DIAGNOSIS — G629 Polyneuropathy, unspecified: Secondary | ICD-10-CM | POA: Insufficient documentation

## 2020-09-11 DIAGNOSIS — Z79899 Other long term (current) drug therapy: Secondary | ICD-10-CM | POA: Insufficient documentation

## 2020-09-11 DIAGNOSIS — Z7984 Long term (current) use of oral hypoglycemic drugs: Secondary | ICD-10-CM | POA: Diagnosis not present

## 2020-09-11 LAB — CBC WITH DIFFERENTIAL/PLATELET
Abs Immature Granulocytes: 0.01 10*3/uL (ref 0.00–0.07)
Basophils Absolute: 0 10*3/uL (ref 0.0–0.1)
Basophils Relative: 1 %
Eosinophils Absolute: 0.1 10*3/uL (ref 0.0–0.5)
Eosinophils Relative: 2 %
HCT: 33.2 % — ABNORMAL LOW (ref 36.0–46.0)
Hemoglobin: 10.6 g/dL — ABNORMAL LOW (ref 12.0–15.0)
Immature Granulocytes: 0 %
Lymphocytes Relative: 33 %
Lymphs Abs: 1.8 10*3/uL (ref 0.7–4.0)
MCH: 30.5 pg (ref 26.0–34.0)
MCHC: 31.9 g/dL (ref 30.0–36.0)
MCV: 95.4 fL (ref 80.0–100.0)
Monocytes Absolute: 0.7 10*3/uL (ref 0.1–1.0)
Monocytes Relative: 12 %
Neutro Abs: 2.8 10*3/uL (ref 1.7–7.7)
Neutrophils Relative %: 52 %
Platelets: 229 10*3/uL (ref 150–400)
RBC: 3.48 MIL/uL — ABNORMAL LOW (ref 3.87–5.11)
RDW: 20.9 % — ABNORMAL HIGH (ref 11.5–15.5)
WBC: 5.4 10*3/uL (ref 4.0–10.5)
nRBC: 0 % (ref 0.0–0.2)

## 2020-09-11 LAB — COMPREHENSIVE METABOLIC PANEL
ALT: 17 U/L (ref 0–44)
AST: 30 U/L (ref 15–41)
Albumin: 3.6 g/dL (ref 3.5–5.0)
Alkaline Phosphatase: 77 U/L (ref 38–126)
Anion gap: 10 (ref 5–15)
BUN: 15 mg/dL (ref 8–23)
CO2: 24 mmol/L (ref 22–32)
Calcium: 8.6 mg/dL — ABNORMAL LOW (ref 8.9–10.3)
Chloride: 102 mmol/L (ref 98–111)
Creatinine, Ser: 0.76 mg/dL (ref 0.44–1.00)
GFR, Estimated: >60 mL/min (ref >60)
Glucose, Bld: 112 mg/dL — ABNORMAL HIGH (ref 70–99)
Potassium: 3.8 mmol/L (ref 3.5–5.1)
Sodium: 136 mmol/L (ref 135–145)
Total Bilirubin: 0.3 mg/dL (ref 0.3–1.2)
Total Protein: 7.5 g/dL (ref 6.5–8.1)

## 2020-09-12 LAB — CANCER ANTIGEN 15-3: CA 15-3: 44.1 U/mL — ABNORMAL HIGH (ref 0.0–25.0)

## 2020-09-15 ENCOUNTER — Other Ambulatory Visit: Payer: Self-pay

## 2020-09-15 ENCOUNTER — Ambulatory Visit (HOSPITAL_COMMUNITY)
Admission: RE | Admit: 2020-09-15 | Discharge: 2020-09-15 | Disposition: A | Discharge status: Home / Self Care | Payer: Medicare HMO | Source: Ambulatory Visit | Attending: Hematology | Admitting: Hematology

## 2020-09-15 DIAGNOSIS — Z170 Estrogen receptor positive status [ER+]: Secondary | ICD-10-CM | POA: Diagnosis present

## 2020-09-15 DIAGNOSIS — C50812 Malignant neoplasm of overlapping sites of left female breast: Secondary | ICD-10-CM | POA: Diagnosis present

## 2020-09-15 MED ORDER — IOHEXOL 300 MG/ML  SOLN
100.0000 mL | Freq: Once | INTRAMUSCULAR | Status: AC | PRN
  Administered 2020-09-15: 100 mL via INTRAVENOUS

## 2020-09-15 NOTE — Telephone Encounter (Signed)
Called pt to r/s procedure, LMTCB. TCS with propofol, asa 3, Dr. Gala Romney

## 2020-09-16 ENCOUNTER — Encounter: Payer: Self-pay | Admitting: *Deleted

## 2020-09-16 NOTE — Telephone Encounter (Signed)
Called pt. She has been scheduled for procedure 6/30 at 9:00am. Aware will mail prep instructions with pre-op/covid test appt. She already has her prep at home.

## 2020-09-17 ENCOUNTER — Other Ambulatory Visit (HOSPITAL_COMMUNITY): Payer: Medicare HMO

## 2020-09-17 ENCOUNTER — Inpatient Hospital Stay (HOSPITAL_BASED_OUTPATIENT_CLINIC_OR_DEPARTMENT_OTHER): Payer: Medicare HMO | Admitting: Hematology

## 2020-09-17 VITALS — BP 125/85 | HR 109 | Temp 96.9°F | Resp 20 | Wt 282.1 lb

## 2020-09-17 DIAGNOSIS — Z170 Estrogen receptor positive status [ER+]: Secondary | ICD-10-CM | POA: Diagnosis not present

## 2020-09-17 DIAGNOSIS — C50812 Malignant neoplasm of overlapping sites of left female breast: Secondary | ICD-10-CM

## 2020-09-17 DIAGNOSIS — C50611 Malignant neoplasm of axillary tail of right female breast: Secondary | ICD-10-CM | POA: Diagnosis not present

## 2020-09-17 MED ORDER — CIPROFLOXACIN HCL 500 MG PO TABS: 500.0000 mg | ORAL_TABLET | Freq: Two times a day (BID) | ORAL | 0 refills | Status: DC

## 2020-09-17 NOTE — Progress Notes (Signed)
[facility] [address] [place] [address]   Patient Care Team: Lemmie Evens, MD as PCP - General (Family Medicine) Gala Romney, Cristopher Estimable, MD as Consulting Physician (Gastroenterology) Donetta Potts, RN as Oncology Nurse Navigator (Oncology) Brien Mates, RN as Oncology Nurse Navigator (Oncology)  SUMMARY OF ONCOLOGIC HISTORY: Oncology History  Malignant neoplasm of overlapping sites of left breast in female, estrogen receptor positive ([place])  11/16/2019 Cancer Staging   Staging form: Breast, AJCC 8th Edition - Clinical stage from 11/16/2019: Stage IV (cT4b, cN1, cM1, G3, ER+, PR-, HER2-) - Signed by [name], MD on 12/17/2019   11/28/2019 Initial Diagnosis   Patient noted intermittent left breast pain and swelling for several months. Mammogram showed a 6.2cm mass in the left breast axillary tail with an adjacent 0.9cm mass, 2 abnormal left axillary lymph nodes, 1 abnormal right axillary lymph node, and diffuse left breast edema and skin thickening. Biopsy showed IDC, grade 3 in the left breast and bilateral axillas, HER-2 negative (1+), ER+ 100%, PR- 0%, Ki67 30%.    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 is a 70 y.o. female here today for follow up of her left breast cancer. Her last visit was on 08/18/2020.   Today she is accompanied by her husband and she reports feeling fair. She reports that she tripped and fell coming out of the bathroom; she denies having prodromal dizziness or lightheadedness. She  then fell after trying to get into her car and hit her left knee. She then had to use a walker for 2.5 weeks and currently walks with a cane. She reports that since 04/18 every time she urinates it is bloody. She previously had uterine bleeding in 2016 and had a D&C by Dr. Elonda Husky to remove polyps. She continues having constant numbness and contracture in her right hand and some numbness in her toes. She is supposed to start her next cycle of Xeloda on 04/21. She reports having soft stools with Xeloda and denies diarrhea. She denies having history of MI's or CHF.  She is scheduled to have a colonoscopy with Dr. Gala Romney on 06/30.    REVIEW OF SYSTEMS:   Review of Systems  Constitutional: Positive for fatigue (25%). Negative for appetite change.  Gastrointestinal: Negative for diarrhea (soft stool).  Genitourinary: Positive for vaginal bleeding (w/ every urination).   Musculoskeletal: Positive for myalgias (6/10 general pain).  Neurological: Positive for numbness (constant in R hand).  Psychiatric/Behavioral: Positive for sleep disturbance.  All other systems reviewed and are negative.   I have reviewed the past medical history, past surgical history, social history and family history with the patient and they are unchanged from previous note.   ALLERGIES:   has No Known Allergies.   MEDICATIONS:  Current Outpatient Medications  Medication Sig Dispense Refill  . apixaban (ELIQUIS) 5 MG TABS tablet Take 1 tablet (5 mg total) by mouth 2 (two) times daily. 60 tablet 3  . benzonatate (TESSALON) 100 MG capsule Take  100 mg by mouth 3 (three) times daily as needed for cough.    . capecitabine (XELODA) 500 MG tablet TAKE 5 TABLETS (2,500 MG TOTAL) BY MOUTH 2 (TWO) TIMES DAILY AFTER A MEAL. TAKE FOR 14 DAYS, THEN HOLD FOR 7 DAYS. REPEAT EVERY 21 DAYS. (Patient taking differently: Take 2,000 mg by mouth 2 (two) times daily after a meal. Take for 14 days, hold for 7. Repeat every 21 days) 140 tablet 0  .  chlorpheniramine-HYDROcodone (TUSSIONEX) 10-8 MG/5ML SUER SMARTSIG:1 Teaspoon By Mouth Twice Daily (Patient taking differently: Take 5 mLs by mouth 2 (two) times daily. SMARTSIG:1 Teaspoon By Mouth Twice Daily) 480 mL 0  . ciprofloxacin (CIPRO) 500 MG tablet Take 1 tablet (500 mg total) by mouth 2 (two) times daily. 14 tablet 0  . ferrous sulfate 325 (65 FE) MG tablet Take 325 mg by mouth daily with breakfast.    . hydrochlorothiazide (HYDRODIURIL) 25 MG tablet Take 25 mg by mouth daily.    Marland Kitchen HYDROcodone-acetaminophen (NORCO/VICODIN) 5-325 MG tablet Take 1 tablet by mouth every 6 (six) hours as needed for moderate pain. 15 tablet 0  . HYDROmorphone (DILAUDID) 2 MG tablet Take 1 tablet (2 mg total) by mouth every 4 (four) hours as needed for severe pain. 30 tablet 0  . KLOR-CON M10 10 MEQ tablet Take 10 mEq by mouth daily.    Marland Kitchen losartan (COZAAR) 50 MG tablet Take 50 mg by mouth daily.    . metFORMIN (GLUCOPHAGE) 500 MG tablet Take 500 mg by mouth daily with breakfast.     . pantoprazole (PROTONIX) 40 MG tablet Take 40 mg by mouth daily.    . potassium chloride (MICRO-K) 10 MEQ CR capsule Take 10 mEq by mouth daily.     . pravastatin (PRAVACHOL) 10 MG tablet Take 10 mg by mouth daily.     . prochlorperazine (COMPAZINE) 10 MG tablet Take 1 tablet (10 mg total) by mouth every 6 (six) hours as needed (Nausea or vomiting). 30 tablet 1   No current facility-administered medications for this visit.     PHYSICAL EXAMINATION: Performance status (ECOG): 1 - Symptomatic but completely ambulatory  Vitals:   09/17/20 1555  BP: 125/85  Pulse: (!) 109  Resp: 20  Temp: (!) 96.9 F (36.1 C)  SpO2: 95%   Wt Readings from Last 3 Encounters:  09/17/20 282 lb 1.6 oz (128 kg)  08/18/20 288 lb 12.8 oz (131 kg)  07/28/20 291 lb 3.2 oz (132.1 kg)   Physical Exam Vitals reviewed.  Constitutional:      Appearance: Normal appearance. She is obese.     Comments: In wheelchair  Neurological:     General:  No focal deficit present.     Mental Status: She is alert and oriented to person, place, and time.  Psychiatric:        Mood and Affect: Mood normal.        Behavior: Behavior normal.     Breast Exam Chaperone: Milinda Antis, MD     LABORATORY DATA:  I have reviewed the data as listed CMP Latest Ref Rng & Units 09/11/2020 08/18/2020 07/28/2020  Glucose 70 - 99 mg/dL 112(H) 123(H) 152(H)  BUN 8 - 23 mg/dL 15 19 18   Creatinine 0.44 - 1.00 mg/dL 0.76 0.73 0.73  Sodium 135 - 145 mmol/L 136 135 136  Potassium 3.5 - 5.1 mmol/L 3.8 3.4(L) 3.4(L)  Chloride 98 - 111 mmol/L 102 103 100  CO2 22 - 32 mmol/L 24 23 26  Calcium 8.9 - 10.3 mg/dL 8.6(L) 8.7(L) 8.8(L)  Total Protein 6.5 - 8.1 g/dL 7.5 7.4 6.9  Total Bilirubin 0.3 - 1.2 mg/dL 0.3 0.5 0.5  Alkaline Phos 38 - 126 U/L 77 65 67  AST 15 - 41 U/L 30 31 35  ALT 0 - 44 U/L 17 17 21    Lab Results  Component Value Date   CAN153 44.1 (H) 09/11/2020   CAN153 35.3 (H) 06/23/2020   CAN153 37.0 (H) 05/20/2020   Lab Results  Component Value Date   WBC 5.4 09/11/2020   HGB 10.6 (L) 09/11/2020   HCT 33.2 (L) 09/11/2020   MCV 95.4 09/11/2020   PLT 229 09/11/2020   NEUTROABS 2.8 09/11/2020    ASSESSMENT:  1. 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 -MRI of the breast on 12/10/2019 shows left breast malignancy involving all 4 quadrants and skin of the left breast including 0.5 cm biopsy-proven malignancy within the far posterior outer right breast, abnormal highly suspicious nonmuscle like enhancement within all 4 quadrants of the left breast and diffuse skin thickening. This abnormal non-mass-like enhancement and skin thickening extends across the midline to the medial right breast. At least 7 abnormal left axillary lymph nodes and  1 abnormal right axillary lymph node compatible with metastatic disease. -CT CAP on 12/12/2019 shows bulky lymphadenopathy in the left subpectoral and axillary regions with largest lymph node measuring 3.3 cm. Mild mediastinal adenopathy in the right paratracheal prevascular and lateral aortic regions, largest lymph node in the lateral aortic region measuring 2.4 cm. Lymphadenopathy in the inferior jugular and supraclavicular regions, left side greater than right. Mild lymphadenopathy in the right axilla. -Multiple small hypervascular lesions seen in the right and left lobesconsistent with liver metastasis. -Bone scan on 12/12/2019 did not show any bone metastasis. -CT CAP on 12/29/2019 showed possible lytic lesion in the T4 vertebral body. -Liver biopsy on 12/28/2019 consistent with metastatic breast cancer. -Paclitaxel weekly 3 weeks on 1 week off started on 12/31/2019. -CT CAP on 03/20/2020 shows mild decrease in the left axillary, subpectoral, supraclavicular, mediastinal and lower jugular lymphadenopathy. Stable small low-attenuation liver lesion. No new progressive metastatic disease. Stable lesion in the left sternal manubrium and T4 vertebral body. Stable short segment concentric wall thickening of the right colon with subcentimeter pericolonic lymph nodes. We will monitor this finding as she has metastatic breast cancer. She is asymptomatic. -CT CAP on 06/18/1998 6:22 cycles of chemotherapy showed no new or progressive metastatic disease. Similar size of the index and nonindex left axillary subpectoral, supraclavicular and lower jugular lymphadenopathy.  2. Right colonic mass: -CT scan showed constricting mass involving the ascending colon measuring 3.6 x 3.5 cm with a small 1 cm right pericolonic lymph node. -She never had a colonoscopy. Denies any bleeding per rectum or melena.  3. Left leg DVT: -Doppler on 03/03/2020 shows left femoral vein, popliteal vein and posterior tibial vein  deep vein thrombosis.   PLAN:  1. Metastatic left breast cancerto the liver: -She has been tolerating Xeloda very well. - We discussed CT scan from 09/11/2020.  This showed mixed response with decrease in size of the breast mass and lymph node in the left axilla.  However sternal lesion has increased.  New liver lesions were found. - CEA 15-3 has also gone up to 42. - Hence I  recommended discontinuing Xeloda.  She will need systemic chemotherapy with 2 agents to control the visceral metastatic disease in the liver. - She has baseline neuropathy.  Hence I would avoid taxane.  I would consider using Adriamycin and Cytoxan. - We will obtain echocardiogram.  We talked about AC regimen and side effects in detail.  2. Right colonic mass: -She continues to have occasional rectal bleeding. - She will have colonoscopy done by Dr. Donzetta Kohut sometime in May.  3.Peripheral neuropathy: -She has neuropathy in the feet which is stable.  Neuropathy in the fingertips has improved since paclitaxel discontinued.  4.Left leg DVT: -Continue Eliquis twice daily.  5.  Blood in the urine: - She reports blood in the urine every time she urinates.  No dysuria reported. - We will treat her with Cipro and see if it improves.  CT scan showed marked thickening of the endometrium which is a new finding extending through the cervix measuring 3 cm. - I think she is having vaginal bleeding.  I have recommended doing a pelvic ultrasound. - She had history of vaginal bleeding 2 years ago and took Megace trial and was evaluated by GYN.  She stopped taking estradiol secondary to increased cost.  We will also consider GYN evaluation.   Breast Cancer therapy associated bone loss: I have recommended calcium, Vitamin D and weight bearing exercises.   Orders Placed This Encounter  Procedures  . US Pelvis Complete    Standing Status:   Future    Standing Expiration Date:   09/17/2021    Order Specific Question:   Reason  for Exam (SYMPTOM  OR DIAGNOSIS REQUIRED)    Answer:   post menopausal bleeding    Order Specific Question:   Preferred imaging location?    Answer:   [facility]    Order Specific Question:   Release to patient    Answer:   Immediate  . ECHOCARDIOGRAM COMPLETE    Standing Status:   Future    Standing Expiration Date:   09/17/2021    Order Specific Question:   Where should this test be performed    Answer:   [facility]    Order Specific Question:   Perflutren DEFINITY (image enhancing agent) should be administered unless hypersensitivity or allergy exist    Answer:   Administer Perflutren    Order Specific Question:   Is a special reader required? (athlete or structural heart)    Answer:   No    Order Specific Question:   Does this study need to be read by the Structural team/Level 3 readers?    Answer:   No    Order Specific Question:   Reason for exam-Echo    Answer:   Chemo  Z09    Order Specific Question:   Release to patient    Answer:   Immediate   The patient has a good understanding of the overall plan. she agrees with it. she will call with any problems that may develop before the next visit here.    [name], MD [place] [phone]   I, Milinda Antis, am acting as a scribe for Dr. Sanda Linger.  I, [name] MD, have reviewed the above documentation for accuracy and completeness, and I agree with the above.

## 2020-09-17 NOTE — Patient Instructions (Signed)
[place] at [facility] Discharge Instructions  You were seen today by Dr. Delton Coombes. He went over your recent results and scans; some lymph nodes and masses have shrunk, while new lesions have appeared in the liver. Hence, your treatment regimen will have to be changed. STOP taking Xeloda. You will be started on chemo, consisting of Adriamycin and Cytoxan given every 3 weeks. You will be scheduled to have an echocardiogram and an ultrasound of your pelvis before your next visit. You will be prescribed Cipro 500 mg to take twice daily for 7 days for your bloody urination. You will also be referred to Dr. Elonda Husky for your uterine bleeding. Dr. Delton Coombes will see you back in 2 weeks for labs and follow up.   Thank you for choosing [place] at [facility] to provide your oncology and hematology care.  To afford each patient quality time with our provider, please arrive at least 15 minutes before your scheduled appointment time.   If you have a lab appointment with the [facility] please come in thru the Main Entrance and check in at the main information desk  You need to re-schedule your appointment should you arrive 10 or more minutes late.  We strive to give you quality time with our providers, and arriving late affects you and other patients whose appointments are after yours.  Also, if you no show three or more times for appointments you may be dismissed from the clinic at the providers discretion.     Again, thank you for choosing [facility].  Our hope is that these requests will decrease the amount of time that you wait before being seen by our physicians.       _____________________________________________________________  Should you have questions after your visit to [facility], please contact our office at [phone] between the hours of 8:00 a.m. and 4:30 p.m.  Voicemails left after 4:00 p.m. will not be returned  until the following business day.  For prescription refill requests, have your pharmacy contact our office and allow 72 hours.    Cancer Center Support Programs:   > Cancer Support Group  2nd Tuesday of the month 1pm-2pm, Journey Room

## 2020-09-19 ENCOUNTER — Other Ambulatory Visit: Payer: Self-pay

## 2020-09-19 ENCOUNTER — Ambulatory Visit (HOSPITAL_COMMUNITY)
Admission: RE | Admit: 2020-09-19 | Discharge: 2020-09-19 | Disposition: A | Discharge status: Home / Self Care | Payer: Medicare HMO | Source: Ambulatory Visit | Attending: Hematology | Admitting: Hematology

## 2020-09-19 DIAGNOSIS — Z0189 Encounter for other specified special examinations: Secondary | ICD-10-CM | POA: Diagnosis not present

## 2020-09-19 DIAGNOSIS — Z170 Estrogen receptor positive status [ER+]: Secondary | ICD-10-CM

## 2020-09-19 DIAGNOSIS — I119 Hypertensive heart disease without heart failure: Secondary | ICD-10-CM | POA: Insufficient documentation

## 2020-09-19 DIAGNOSIS — C50812 Malignant neoplasm of overlapping sites of left female breast: Secondary | ICD-10-CM | POA: Insufficient documentation

## 2020-09-19 DIAGNOSIS — E119 Type 2 diabetes mellitus without complications: Secondary | ICD-10-CM | POA: Diagnosis not present

## 2020-09-19 LAB — ECHOCARDIOGRAM COMPLETE
Area-P 1/2: 3.24 cm2
S' Lateral: 1.7 cm

## 2020-09-19 NOTE — Progress Notes (Signed)
*  PRELIMINARY RESULTS* Echocardiogram 2D Echocardiogram has been performed.  Crystal Vega 09/19/2020, 12:57 PM

## 2020-09-23 ENCOUNTER — Ambulatory Visit (HOSPITAL_COMMUNITY)
Admission: RE | Admit: 2020-09-23 | Discharge: 2020-09-23 | Disposition: A | Discharge status: Home / Self Care | Payer: Medicare HMO | Source: Ambulatory Visit | Attending: Hematology | Admitting: Hematology

## 2020-09-23 ENCOUNTER — Other Ambulatory Visit: Payer: Self-pay

## 2020-09-23 DIAGNOSIS — D259 Leiomyoma of uterus, unspecified: Secondary | ICD-10-CM | POA: Diagnosis not present

## 2020-09-23 DIAGNOSIS — Z170 Estrogen receptor positive status [ER+]: Secondary | ICD-10-CM | POA: Insufficient documentation

## 2020-09-23 DIAGNOSIS — N950 Postmenopausal bleeding: Secondary | ICD-10-CM | POA: Diagnosis not present

## 2020-09-23 DIAGNOSIS — C50812 Malignant neoplasm of overlapping sites of left female breast: Secondary | ICD-10-CM | POA: Diagnosis present

## 2020-09-26 NOTE — Progress Notes (Signed)
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

## 2020-09-26 NOTE — Progress Notes (Signed)
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

## 2020-09-29 ENCOUNTER — Other Ambulatory Visit (HOSPITAL_COMMUNITY): Payer: Self-pay

## 2020-09-29 DIAGNOSIS — C50812 Malignant neoplasm of overlapping sites of left female breast: Secondary | ICD-10-CM

## 2020-09-29 DIAGNOSIS — Z170 Estrogen receptor positive status [ER+]: Secondary | ICD-10-CM

## 2020-10-02 ENCOUNTER — Inpatient Hospital Stay (HOSPITAL_COMMUNITY): Payer: Medicare HMO

## 2020-10-02 ENCOUNTER — Inpatient Hospital Stay (HOSPITAL_COMMUNITY): Payer: Medicare HMO | Attending: Hematology | Admitting: Hematology

## 2020-10-02 ENCOUNTER — Other Ambulatory Visit: Payer: Self-pay

## 2020-10-02 VITALS — BP 127/96 | HR 107 | Temp 97.0°F | Resp 20 | Wt 276.4 lb

## 2020-10-02 VITALS — BP 103/65 | HR 91 | Temp 97.9°F | Resp 18 | Ht 63.0 in

## 2020-10-02 DIAGNOSIS — Z79818 Long term (current) use of other agents affecting estrogen receptors and estrogen levels: Secondary | ICD-10-CM | POA: Diagnosis not present

## 2020-10-02 DIAGNOSIS — Z87891 Personal history of nicotine dependence: Secondary | ICD-10-CM | POA: Diagnosis not present

## 2020-10-02 DIAGNOSIS — C773 Secondary and unspecified malignant neoplasm of axilla and upper limb lymph nodes: Secondary | ICD-10-CM | POA: Insufficient documentation

## 2020-10-02 DIAGNOSIS — I10 Essential (primary) hypertension: Secondary | ICD-10-CM | POA: Diagnosis not present

## 2020-10-02 DIAGNOSIS — E1142 Type 2 diabetes mellitus with diabetic polyneuropathy: Secondary | ICD-10-CM | POA: Insufficient documentation

## 2020-10-02 DIAGNOSIS — Z5111 Encounter for antineoplastic chemotherapy: Secondary | ICD-10-CM | POA: Diagnosis not present

## 2020-10-02 DIAGNOSIS — G893 Neoplasm related pain (acute) (chronic): Secondary | ICD-10-CM | POA: Insufficient documentation

## 2020-10-02 DIAGNOSIS — C50812 Malignant neoplasm of overlapping sites of left female breast: Secondary | ICD-10-CM

## 2020-10-02 DIAGNOSIS — N939 Abnormal uterine and vaginal bleeding, unspecified: Secondary | ICD-10-CM | POA: Insufficient documentation

## 2020-10-02 DIAGNOSIS — Z7901 Long term (current) use of anticoagulants: Secondary | ICD-10-CM | POA: Insufficient documentation

## 2020-10-02 DIAGNOSIS — Z170 Estrogen receptor positive status [ER+]: Secondary | ICD-10-CM | POA: Insufficient documentation

## 2020-10-02 DIAGNOSIS — C787 Secondary malignant neoplasm of liver and intrahepatic bile duct: Secondary | ICD-10-CM | POA: Diagnosis not present

## 2020-10-02 DIAGNOSIS — Z95828 Presence of other vascular implants and grafts: Secondary | ICD-10-CM

## 2020-10-02 DIAGNOSIS — Z79899 Other long term (current) drug therapy: Secondary | ICD-10-CM | POA: Insufficient documentation

## 2020-10-02 DIAGNOSIS — Z86718 Personal history of other venous thrombosis and embolism: Secondary | ICD-10-CM | POA: Insufficient documentation

## 2020-10-02 LAB — COMPREHENSIVE METABOLIC PANEL
ALT: 19 U/L (ref 0–44)
AST: 33 U/L (ref 15–41)
Albumin: 3.5 g/dL (ref 3.5–5.0)
Alkaline Phosphatase: 78 U/L (ref 38–126)
Anion gap: 8 (ref 5–15)
BUN: 18 mg/dL (ref 8–23)
CO2: 26 mmol/L (ref 22–32)
Calcium: 8.8 mg/dL — ABNORMAL LOW (ref 8.9–10.3)
Chloride: 102 mmol/L (ref 98–111)
Creatinine, Ser: 0.76 mg/dL (ref 0.44–1.00)
GFR, Estimated: >60 mL/min (ref >60)
Glucose, Bld: 144 mg/dL — ABNORMAL HIGH (ref 70–99)
Potassium: 3.3 mmol/L — ABNORMAL LOW (ref 3.5–5.1)
Sodium: 136 mmol/L (ref 135–145)
Total Bilirubin: 0.7 mg/dL (ref 0.3–1.2)
Total Protein: 7.1 g/dL (ref 6.5–8.1)

## 2020-10-02 LAB — CBC WITH DIFFERENTIAL/PLATELET
Abs Immature Granulocytes: 0.03 10*3/uL (ref 0.00–0.07)
Basophils Absolute: 0.1 10*3/uL (ref 0.0–0.1)
Basophils Relative: 1 %
Eosinophils Absolute: 0.1 10*3/uL (ref 0.0–0.5)
Eosinophils Relative: 2 %
HCT: 28.5 % — ABNORMAL LOW (ref 36.0–46.0)
Hemoglobin: 9.2 g/dL — ABNORMAL LOW (ref 12.0–15.0)
Immature Granulocytes: 1 %
Lymphocytes Relative: 23 %
Lymphs Abs: 1.4 10*3/uL (ref 0.7–4.0)
MCH: 31.2 pg (ref 26.0–34.0)
MCHC: 32.3 g/dL (ref 30.0–36.0)
MCV: 96.6 fL (ref 80.0–100.0)
Monocytes Absolute: 0.7 10*3/uL (ref 0.1–1.0)
Monocytes Relative: 11 %
Neutro Abs: 3.9 10*3/uL (ref 1.7–7.7)
Neutrophils Relative %: 62 %
Platelets: 220 10*3/uL (ref 150–400)
RBC: 2.95 MIL/uL — ABNORMAL LOW (ref 3.87–5.11)
RDW: 18 % — ABNORMAL HIGH (ref 11.5–15.5)
WBC: 6.1 10*3/uL (ref 4.0–10.5)
nRBC: 0 % (ref 0.0–0.2)

## 2020-10-02 LAB — MAGNESIUM: Magnesium: 2 mg/dL (ref 1.7–2.4)

## 2020-10-02 MED ORDER — DOXORUBICIN HCL LIPOSOMAL CHEMO INJECTION 2 MG/ML
29.0000 mg/m2 | Freq: Once | INTRAVENOUS | Status: AC
  Administered 2020-10-02: 70 mg via INTRAVENOUS
  Filled 2020-10-02: qty 25

## 2020-10-02 MED ORDER — HEPARIN SOD (PORK) LOCK FLUSH 100 UNIT/ML IV SOLN
500.0000 [IU] | Freq: Once | INTRAVENOUS | Status: AC | PRN
  Administered 2020-10-02: 500 [IU]

## 2020-10-02 MED ORDER — SODIUM CHLORIDE 0.9 % IV SOLN
10.0000 mg | Freq: Once | INTRAVENOUS | Status: AC
  Administered 2020-10-02: 10 mg via INTRAVENOUS
  Filled 2020-10-02: qty 10

## 2020-10-02 MED ORDER — SODIUM CHLORIDE 0.9% FLUSH
10.0000 mL | INTRAVENOUS | Status: DC | PRN
  Administered 2020-10-02: 10 mL

## 2020-10-02 MED ORDER — PALONOSETRON HCL INJECTION 0.25 MG/5ML
0.2500 mg | Freq: Once | INTRAVENOUS | Status: AC
Start: 2020-10-02 — End: 2020-10-02
  Administered 2020-10-02: 0.25 mg via INTRAVENOUS
  Filled 2020-10-02: qty 5

## 2020-10-02 MED ORDER — LANREOTIDE ACETATE 120 MG/0.5ML ~~LOC~~ SOLN
SUBCUTANEOUS | Status: AC
  Filled 2020-10-02: qty 120

## 2020-10-02 MED ORDER — DEXTROSE 5 % IV SOLN: Freq: Once | INTRAVENOUS | Status: AC

## 2020-10-02 NOTE — Progress Notes (Signed)
Patient assessed by Dr Delton Coombes and labs reviewed and is okay for treatment today.

## 2020-10-02 NOTE — Patient Instructions (Signed)
[place] at [facility] Discharge Instructions  You were seen today by Dr. Delton Coombes. He went over your recent results. You received your treatment today, only the doxorubicin, which will be given every 4 weeks. Side effects include nausea, heart failure, mouth sores and skin blisters. Drink plenty of fluids daily to keep your mouth and kidneys hydrated. If you develop mouth soreness or ulcers, mix 1 teaspoon of salt and 1 teaspoon of baking soda in a glass of water and rinse your mouth and spit out every 2-3 hours. If you develop skin blisters, call the office immediately. You will be referred to Dr. Elonda Husky, the gynecologist, for your uterine bleeding. Dr. Delton Coombes will see you back in 1 month for labs and follow up.   Thank you for choosing [place] at [facility] to provide your oncology and hematology care.  To afford each patient quality time with our provider, please arrive at least 15 minutes before your scheduled appointment time.   If you have a lab appointment with the [facility] please come in thru the Main Entrance and check in at the main information desk  You need to re-schedule your appointment should you arrive 10 or more minutes late.  We strive to give you quality time with our providers, and arriving late affects you and other patients whose appointments are after yours.  Also, if you no show three or more times for appointments you may be dismissed from the clinic at the providers discretion.     Again, thank you for choosing [facility].  Our hope is that these requests will decrease the amount of time that you wait before being seen by our physicians.       _____________________________________________________________  Should you have questions after your visit to [facility], please contact our office at [phone] between the hours of 8:00 a.m. and 4:30 p.m.  Voicemails left after 4:00 p.m. will  not be returned until the following business day.  For prescription refill requests, have your pharmacy contact our office and allow 72 hours.    Cancer Center Support Programs:   > Cancer Support Group  2nd Tuesday of the month 1pm-2pm, Journey Room

## 2020-10-02 NOTE — Progress Notes (Signed)
Treatment given per orders. Patient tolerated it well without problems. Vitals stable and discharged home from clinic via wheelchair Follow up as scheduled.  

## 2020-10-02 NOTE — Progress Notes (Signed)
Crystal Vega, [place] [address]   CLINIC:  Medical Oncology/Hematology  PCP:  Lemmie Evens, MD [place]. / [place] [address] [phone]   REASON FOR VISIT:  Follow-up for left breast cancer  PRIOR THERAPY:  1. Paclitaxel x 6 cycles from 12/31/2019 to 06/02/2020. 2. Xeloda through 09/17/2020.  NGS Results: ER/HER-2 positive, PR negative, Ki-67 30%  CURRENT THERAPY: Doxorubicin & Aloxi every 4 weeks  BRIEF ONCOLOGIC HISTORY:  Oncology History  Malignant neoplasm of overlapping sites of left breast in female, estrogen receptor positive ([place])  11/16/2019 Cancer Staging   Staging form: Breast, AJCC 8th Edition - Clinical stage from 11/16/2019: Stage IV (cT4b, cN1, cM1, G3, ER+, PR-, HER2-) - Signed by [name], MD on 12/17/2019   11/28/2019 Initial Diagnosis   Patient noted intermittent left breast pain and swelling for several months. Mammogram showed a 6.2cm mass in the left breast axillary tail with an adjacent 0.9cm mass, 2 abnormal left axillary lymph nodes, 1 abnormal right axillary lymph node, and diffuse left breast edema and skin thickening. Biopsy showed IDC, grade 3 in the left breast and bilateral axillas, HER-2 negative (1+), ER+ 100%, PR- 0%, Ki67 30%.    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 female, returns for routine follow-up and consideration for first cycle of chemotherapy. Crystal Vega was last seen on 09/17/2020.  Due for initiating cycle #1 of doxorubicin and Aloxi today.   Today she is accompanied by her husband. Overall, she tells me she has been feeling okay. She stopped taking Xeloda and denies having skin rash or blisters. Her left breast and arm pain have worsened since stopping Xeloda. She continues having uterine bleeding almost daily and used to take Megace. Her appetite is slightly decreased and reports having occasional mild nausea in the AM.  She has not set an appointment with Dr. Elonda Husky. She is set to have a colonoscopy with Dr. Gala Romney on 06/30.  Overall, she feels ready for first cycle of chemo today.    REVIEW OF SYSTEMS:  Review of Systems  Constitutional: Positive for appetite change (75%) and fatigue.  Cardiovascular: Positive for chest pain (L breast & arm pain).  Genitourinary: Positive for hematuria and vaginal bleeding (almost daily).   Skin: Negative for rash.  Psychiatric/Behavioral: Positive for sleep disturbance.  All other systems reviewed and are negative.   PAST  MEDICAL/SURGICAL HISTORY:  Past Medical History:  Diagnosis Date  . Anemia   . Arthritis    per patient " left knee"  . Essential hypertension, benign   . Family history of cancer of female genital organ   . Family history of GI tract cancer   . Metastatic breast cancer ([place])    left breast  . Port-A-Cath in place 12/25/2019  . Type 2 diabetes  mellitus ([place])    Past Surgical History:  Procedure Laterality Date  . CATARACT EXTRACTION W/ INTRAOCULAR LENS IMPLANT Right   . HYSTEROSCOPY WITH D & C N/A 06/05/2014   Procedure: DILATATION AND CURETTAGE /HYSTEROSCOPY;  Surgeon: Florian Buff, MD;  Location: [facility];  Service: Gynecology;  Laterality: N/A;  . POLYPECTOMY N/A 06/05/2014   Procedure: ENDOMETRIAL POLYPECTOMY;  Surgeon: Florian Buff, MD;  Location: [facility];  Service: Gynecology;  Laterality: N/A;  . PORTACATH PLACEMENT N/A 12/14/2019   Procedure: INSERTION PORT-A-CATH WITH ULTRASOUND GUIDANCE;  Surgeon: Donnie Mesa, MD;  Location: [facility];  Service: General;  Laterality: N/A;  . TUBAL LIGATION      SOCIAL HISTORY:  Social History   Socioeconomic History  . Marital status: Married    Spouse name: Herbie Baltimore  . Number of children: 2  . Years of education: Not on file  . Highest education level: Not on file  Occupational History  . Occupation: retired  Tobacco Use  . Smoking status: Former Smoker    Types: Cigarettes  . Smokeless tobacco: Never Used  . Tobacco comment: quit about 40+ years ago  Vaping Use  . Vaping Use: Never used  Substance and Sexual Activity  . Alcohol use: Never  . Drug use: No  . Sexual activity: Not on file  Other Topics Concern  . Not on file  Social History Narrative  . Not on file   Social Determinants of Health   Financial Resource Strain: Low Risk   . Difficulty of Paying Living Expenses: Not hard at all  Food Insecurity: No Food Insecurity  . Worried About Charity fundraiser in the Last Year: Never true  . Ran Out of Food in the Last Year: Never true  Transportation Needs: No Transportation Needs  . Lack of Transportation (Medical): No  . Lack of Transportation (Non-Medical): No  Physical Activity: Inactive  . Days of Exercise per Week: 0 days  . Minutes of Exercise per Session: 0 min  Stress: No Stress Concern Present  . Feeling of Stress : Only a little  Social Connections:  Moderately Integrated  . Frequency of Communication with Friends and Family: Three times a week  . Frequency of Social Gatherings with Friends and Family: Three times a week  . Attends Religious Services: 1 to 4 times per year  . Active Member of Clubs or Organizations: No  . Attends Archivist Meetings: Never  . Marital Status: Married  Human resources officer Violence: Not At Risk  . Fear of Current or Ex-Partner: No  . Emotionally Abused: No  . Physically Abused: No  . Sexually Abused: No    FAMILY HISTORY:  Family History  Problem Relation Age of Onset  . Diabetes Mellitus II Father   . Congestive Heart Failure Father   . Colon cancer Mother 42       patient not sure if colon vs stomach  . Stroke Maternal Grandmother   . Cancer Cousin        female reproductive cancer, dx. 50s/60s  CURRENT MEDICATIONS:  Current Outpatient Medications  Medication Sig Dispense Refill  . apixaban (ELIQUIS) 5 MG TABS tablet Take 1 tablet (5 mg total) by mouth 2 (two) times daily. 60 tablet 3  . benzonatate (TESSALON) 100 MG capsule Take 100 mg by mouth 3 (three) times daily as needed for cough.    . chlorpheniramine-HYDROcodone (TUSSIONEX) 10-8 MG/5ML SUER SMARTSIG:1 Teaspoon By Mouth Twice Daily (Patient taking differently: Take 5 mLs by mouth 2 (two) times daily. SMARTSIG:1 Teaspoon By Mouth Twice Daily) 480 mL 0  . ciprofloxacin (CIPRO) 500 MG tablet Take 1 tablet (500 mg total) by mouth 2 (two) times daily. 14 tablet 0  . ferrous sulfate 325 (65 FE) MG tablet Take 325 mg by mouth daily with breakfast.    . hydrochlorothiazide (HYDRODIURIL) 25 MG tablet Take 25 mg by mouth daily.    Marland Kitchen HYDROcodone-acetaminophen (NORCO/VICODIN) 5-325 MG tablet Take 1 tablet by mouth every 6 (six) hours as needed for moderate pain. 15 tablet 0  . HYDROmorphone (DILAUDID) 2 MG tablet Take 1 tablet (2 mg total) by mouth every 4 (four) hours as needed for severe pain. 30 tablet 0  . KLOR-CON M10 10 MEQ tablet  Take 10 mEq by mouth daily.    Marland Kitchen losartan (COZAAR) 50 MG tablet Take 50 mg by mouth daily.    . metFORMIN (GLUCOPHAGE) 500 MG tablet Take 500 mg by mouth daily with breakfast.     . pantoprazole (PROTONIX) 40 MG tablet Take 40 mg by mouth daily.    . potassium chloride (MICRO-K) 10 MEQ CR capsule Take 10 mEq by mouth daily.     . pravastatin (PRAVACHOL) 10 MG tablet Take 10 mg by mouth daily.     . prochlorperazine (COMPAZINE) 10 MG tablet Take 1 tablet (10 mg total) by mouth every 6 (six) hours as needed (Nausea or vomiting). 30 tablet 1   No current facility-administered medications for this visit.    ALLERGIES:  No Known Allergies  PHYSICAL EXAM:  Performance status (ECOG): 1 - Symptomatic but completely ambulatory  Vitals:   10/02/20 0836  BP: (!) 127/96  Pulse: (!) 107  Resp: 20  Temp: (!) 97 F (36.1 C)  SpO2: 99%   Wt Readings from Last 3 Encounters:  10/02/20 276 lb 6.4 oz (125.4 kg)  09/17/20 282 lb 1.6 oz (128 kg)  08/18/20 288 lb 12.8 oz (131 kg)   Physical Exam Vitals reviewed.  Constitutional:      Appearance: Normal appearance. She is obese.  Cardiovascular:     Rate and Rhythm: Normal rate and regular rhythm.     Pulses: Normal pulses.     Heart sounds: Normal heart sounds.  Pulmonary:     Effort: Pulmonary effort is normal.     Breath sounds: Normal breath sounds.  Chest:     Comments: Port-a-Cath in R chest Neurological:     General: No focal deficit present.     Mental Status: She is alert and oriented to person, place, and time.  Psychiatric:        Mood and Affect: Mood normal.        Behavior: Behavior normal.     LABORATORY DATA:  I have reviewed the labs as listed.  CBC Latest Ref Rng & Units 10/02/2020 09/11/2020 08/18/2020  WBC 4.0 - 10.5 K/uL 6.1 5.4 6.8  Hemoglobin 12.0 - 15.0 g/dL 9.2(L) 10.6(L) 10.0(L)  Hematocrit 36.0 - 46.0 % 28.5(L) 33.2(L) 30.8(L)  Platelets 150 - 400 K/uL 220 229  248   CMP Latest Ref Rng & Units 10/02/2020  09/11/2020 08/18/2020  Glucose 70 - 99 mg/dL 144(H) 112(H) 123(H)  BUN 8 - 23 mg/dL 18 15 19   Creatinine 0.44 - 1.00 mg/dL 0.76 0.76 0.73  Sodium 135 - 145 mmol/L 136 136 135  Potassium 3.5 - 5.1 mmol/L 3.3(L) 3.8 3.4(L)  Chloride 98 - 111 mmol/L 102 102 103  CO2 22 - 32 mmol/L 26 24 23   Calcium 8.9 - 10.3 mg/dL 8.8(L) 8.6(L) 8.7(L)  Total Protein 6.5 - 8.1 g/dL 7.1 7.5 7.4  Total Bilirubin 0.3 - 1.2 mg/dL 0.7 0.3 0.5  Alkaline Phos 38 - 126 U/L 78 77 65  AST 15 - 41 U/L 33 30 31  ALT 0 - 44 U/L 19 17 17     DIAGNOSTIC IMAGING:  I have independently reviewed the scans and discussed with the patient. US Pelvis Complete  Result Date: 09/24/2020 CLINICAL DATA:  Initial evaluation for postmenopausal bleeding. EXAM: TRANSABDOMINAL ULTRASOUND OF PELVIS TECHNIQUE: Transabdominal ultrasound examination of the pelvis was performed including evaluation of the uterus, ovaries, adnexal regions, and pelvic cul-de-sac. COMPARISON:  Prior CT from 09/15/2020. FINDINGS: Uterus Measurements: 11.0 x 6.8 x 8.9 cm = volume: 349.2 mL. Uterus is anteverted. At least 2 distinct uterine fibroids are seen. The largest of these is partially calcified positioned at the right uterine body/fundus and measures 5.1 x 4.2 x 3.9 cm. A second partially calcified intramural to subserosal fibroid at the right uterine body measures 2.2 x 1.8 x 2.2 cm Endometrium Endometrial stripe not well assessed on this transabdominal only exam. Visualized endometrium appears thickened up to 19.3 mm no definite focal abnormality. Right ovary Not visualized.  No right adnexal mass. Left ovary Measurements: 2.5 x 1.8 x 2.3 cm = volume: 5.4 mL. Normal appearance/no adnexal mass. Other findings:  No abnormal free fluid. IMPRESSION: 1. Thickened endometrial stripe measuring up to 19.3 mm. In the setting of post-menopausal bleeding, endometrial sampling is indicated to exclude carcinoma. If results are benign, sonohysterogram should be considered for focal  lesion work-up. (Ref: Radiological Reasoning: Algorithmic Workup of Abnormal Vaginal Bleeding with Endovaginal Sonography and Sonohysterography. AJR 2008; 734:Y37-09). Please note that evaluation of the endometrial stripe is somewhat limited on this transabdominal only exam. 2. Two distinct partially calcified fibroids measuring up to 5.1 cm as above. 3. Normal sonographic appearance of the left ovary, with nonvisualization of the right ovary. No adnexal mass or free fluid. Electronically Signed   By: Jeannine Boga M.D.   On: 09/24/2020 01:26   CT CHEST ABDOMEN PELVIS W CONTRAST  Result Date: 09/15/2020 CLINICAL DATA:  Breast cancer, assess treatment response in a patient with LEFT breast cancer. Currently on oral systemic therapy. EXAM: CT CHEST, ABDOMEN, AND PELVIS WITH CONTRAST TECHNIQUE: Multidetector CT imaging of the chest, abdomen and pelvis was performed following the standard protocol during bolus administration of intravenous contrast. CONTRAST:  169m OMNIPAQUE IOHEXOL 300 MG/ML  SOLN COMPARISON:  June 18, 2020 FINDINGS: CT CHEST FINDINGS Cardiovascular: Normal caliber thoracic aorta. Normal heart size without pericardial effusion. RIGHT-sided Port-A-Cath terminates in the lower RIGHT atrium similar appearance to prior imaging. Mediastinum/Nodes: Anterior mediastinal soft tissue tracking towards the pericardium and prevascular nodal diseasew slightly Ill-defined internal mammary lymph nodes are stable. Bulky LEFT axillary and retropectoral nodal disease slightly increased compared to the prior study (image 19, series 2) 1.8 cm short axis previously 1.6 cm. Nodularity elsewhere in the mediastinal fat and small lymph nodes with similar appearance. LEFT axillary mass measuring  5.0 x 4.2 cm previously 4.9 x 5.8 cm, contiguous with bulky ill-defined soft tissue in the retropectoral region on the LEFT. 2.3 cm LEFT axillary lymph node (image 20, series 2) previously 2.5 cm. Discrete lymph node  along the margin of the pectoralis major on image 17 of series 2, 1.3 cm as compared to 1.9 cm. Nodal disease less well-defined. Soft tissue with confluent appearance in the LEFT axilla at the site of previous discrete lymph nodes on image 17 of series 2 measuring 3.5 x 2.1 cm today not clearly evident as a discrete mass on the prior study. Diffuse nodular enhancement in the LEFT pectoralis muscle, subtle subcentimeter areas of nodularity with enhancement throughout the musculature of the LEFT pectoralis major. LEFT breast not imaged in its entirety showing skin thickening and edema. Bulky LEFT thoracic inlet lymph nodes with similar appearance largest at 1.8 cm these track into the neck, incompletely evaluated. Similar appearance also of visualized supraclavicular lymph nodes. RIGHT paratracheal lymph nodes are stable largest on image 19 of series 2 at 12 mm. No gross hilar adenopathy. Lungs/Pleura: Basilar atelectasis. No effusion. No consolidation. Airways are patent. Musculoskeletal: See below for full musculoskeletal detail. Destructive changes in the LEFT sternum have developed in the interval, in the sternal manubrium on image 37 of series 3, lytic focus at 2.3 x 0.9 cm, associated soft tissue in the internal mammary region is contiguous with this and is new on image 17 of series 2 measuring approximately 1.5 cm. CT ABDOMEN PELVIS FINDINGS Hepatobiliary: Signs of hepatic metastatic disease, new hepatic lesions (image 52, series 2) 1.4 cm hepatic lesion in the RIGHT hepatic lobe. Four discrete lesions in the LEFT hepatic lobe on image 49 of series 2, largest in hepatic subsegment III approximately 1.5 cm. Greater than 10 lesions in the RIGHT hepatic lobe not visible previously. Portal vein is patent. No pericholecystic stranding or biliary duct dilation. Pancreas: Normal, without mass, inflammation or ductal dilatation. Spleen: Spleen normal size and contour.  No focal splenic lesion. Adrenals/Urinary Tract:  Adrenal glands are normal. Symmetric renal enhancement. No hydronephrosis. Urinary bladder is collapsed otherwise unremarkable. Stomach/Bowel: No acute gastrointestinal process. Colonic diverticulosis. Normal appendix. Colonic thickening of the ascending colon with annular lesion suspected as on the previous study best seen on coronal image 75 of series 4 Vascular/Lymphatic: Calcified and noncalcified atheromatous plaque of the abdominal aorta. There is no gastrohepatic or hepatoduodenal ligament lymphadenopathy. No retroperitoneal or mesenteric lymphadenopathy. Smooth contour of the IVC. Mildly enlarged pelvic lymph nodes largest along the RIGHT external iliac chain approximately 8 mm. No gross pelvic adenopathy. Reproductive: Marked thickening of the endometrium is a new finding compared to the prior study extending through the cervix measuring 3 cm on sagittal images. Other: No ascites. Musculoskeletal: Signs of sternal destruction of the sternal manubrium to the LEFT of midline associated with soft tissue outside of the sternum in the chest wall along the internal mammary chain. T4 lesion with similar appearance best seen on image 17 of series 2 measuring approximately 10 mm. IMPRESSION: 1. Mixed response with signs of worsening sternal destruction and soft tissue about the sternal manubrium. Decreased discrete lymph nodes in the LEFT axilla and decreased size of the dominant mass but with increased appearance of infiltrative nodular changes. 2. New hepatic metastatic disease could be from either colonic or breast primary. 3. Marked thickening of the endometrium is a new finding extending through the cervix measuring 3 cm on sagittal images. Consider further evaluation with ultrasound for complete  assessment. Hyperplasia or neoplasm could have this appearance and this represents a change from prior imaging. 4. Annular colonic lesion as described. Suspicious for colonic neoplasm. 5. Signs of T4 metastasis about  the basivertebral plexus not grossly changed but more visible perhaps on today's study. 6. Mildly enlarged RIGHT axillary lymph node is unchanged, attention on follow-up 11 mm short axis. 7. Mildly enlarged pelvic lymph nodes, largest along the RIGHT external iliac chain. Attention on follow-up 8. Aortic atherosclerosis. Aortic Atherosclerosis (ICD10-I70.0). Electronically Signed   By: Zetta Bills M.D.   On: 09/15/2020 18:27   ECHOCARDIOGRAM COMPLETE  Result Date: 09/19/2020    ECHOCARDIOGRAM REPORT   Patient Name:   KEIRSTON SAEPHANH Date of Exam: 09/19/2020 Medical Rec #:  [id number]       Height:       63.0 in Accession #:    [id number]      Weight:       282.1 lb Date of Birth:  May 21, 1951       BSA:          2.238 m Patient Age:    71 years        BP:           152/84 mmHg Patient Gender: F               HR:           102 bpm. Exam Location:  [facility] Procedure: 2D Echo, Cardiac Doppler and Color Doppler Indications:    C50.812,Z17.0 (ICD-10-CM) - Malignant neoplasm of overlapping                 sites of left breast in female, estrogen receptor positive  History:        Patient has prior history of Echocardiogram examinations, most                 recent 10/31/2013. Risk Factors:Hypertension and Diabetes. Morbid                 obesity, Metastatic breast cancer ([place]) (From Hx).  Sonographer:    Alvino Chapel RCS Referring Phys: [id number] [facility]  Sonographer Comments: Technically difficult study due to poor echo windows. Image acquisition challenging due to patient body habitus. Patient left breast area is Extremely tender to the touch when attempting echo images. I.V. was attempted by nurse but could not obtain a patent I.V. IMPRESSIONS  1. Images were very limited and IV access could not be obtained for use of Definity. Could consider future evaluation of LVEF via MUGA scan.  2. Left ventricular ejection fraction, by estimation, is 60 to 65%. The left ventricle has normal function. Left  ventricular endocardial border not optimally defined to evaluate regional wall motion. There is moderate left ventricular hypertrophy. Left ventricular diastolic parameters are indeterminate.  3. Right ventricular systolic function is normal. The right ventricular size is normal. Tricuspid regurgitation signal is inadequate for assessing PA pressure.  4. The mitral valve is grossly normal. Trivial mitral valve regurgitation.  5. The aortic valve is tricuspid. There is mild calcification of the aortic valve. Aortic valve regurgitation is not visualized.  6. The inferior vena cava is normal in size with greater than 50% respiratory variability, suggesting right atrial pressure of 3 mmHg. FINDINGS  Left Ventricle: Left ventricular ejection fraction, by estimation, is 60 to 65%. The left ventricle has normal function. Left ventricular endocardial border not optimally defined to evaluate regional wall motion. The left ventricular internal cavity  size was small. There is moderate left ventricular hypertrophy. Left ventricular diastolic parameters are indeterminate. Right Ventricle: The right ventricular size is normal. No increase in right ventricular wall thickness. Right ventricular systolic function is normal. Tricuspid regurgitation signal is inadequate for assessing PA pressure. Left Atrium: Left atrial size was normal in size. Right Atrium: Right atrial size was normal in size. Pericardium: There is no evidence of pericardial effusion. Presence of pericardial fat pad. Mitral Valve: The mitral valve is grossly normal. Trivial mitral valve regurgitation. Tricuspid Valve: The tricuspid valve is grossly normal. Tricuspid valve regurgitation is trivial. Aortic Valve: The aortic valve is tricuspid. There is mild calcification of the aortic valve. Aortic valve regurgitation is not visualized. Pulmonic Valve: The pulmonic valve was grossly normal. Pulmonic valve regurgitation is trivial. Aorta: The aortic root is normal in  size and structure. Venous: The inferior vena cava is normal in size with greater than 50% respiratory variability, suggesting right atrial pressure of 3 mmHg. IAS/Shunts: No atrial level shunt detected by color flow Doppler.  LEFT VENTRICLE PLAX 2D LVIDd:         2.90 cm LVIDs:         1.70 cm LV PW:         1.40 cm LV IVS:        1.40 cm LVOT diam:     1.70 cm LV SV:         43 LV SV Index:   19 LVOT Area:     2.27 cm  RIGHT VENTRICLE TAPSE (M-mode): 1.9 cm LEFT ATRIUM           Index LA diam:      2.60 cm 1.16 cm/m LA Vol (A4C): 27.4 ml 12.24 ml/m  AORTIC VALVE LVOT Vmax:   106.00 cm/s LVOT Vmean:  64.700 cm/s LVOT VTI:    0.188 m  AORTA Ao Root diam: 3.20 cm MITRAL VALVE MV Area (PHT): 3.24 cm    SHUNTS MV Decel Time: 235 msec    Systemic VTI:  0.19 m MV E velocity: 79.40 cm/s  Systemic Diam: 1.70 cm MV A velocity: 85.30 cm/s MV E/A ratio:  0.93 Rozann Lesches MD Electronically signed by Rozann Lesches MD Signature Date/Time: 09/19/2020/5:01:01 PM    Final      ASSESSMENT:  1. 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 -MRI of the breast on 12/10/2019 shows left breast malignancy involving all 4 quadrants and skin of the left breast including 0.5 cm biopsy-proven malignancy within the far posterior outer right breast, abnormal highly suspicious nonmuscle like enhancement within all 4 quadrants of the left breast and diffuse skin thickening. This abnormal non-mass-like enhancement and skin thickening extends across the midline to the medial right breast. At least 7 abnormal left axillary lymph nodes and 1 abnormal right axillary lymph node compatible with metastatic disease. -CT CAP on 12/12/2019 shows bulky lymphadenopathy in the left subpectoral and axillary regions with largest lymph node  measuring 3.3 cm. Mild mediastinal adenopathy in the right paratracheal prevascular and lateral aortic regions, largest lymph node in the lateral aortic region measuring 2.4 cm. Lymphadenopathy in the inferior jugular and supraclavicular regions, left side greater than right. Mild lymphadenopathy in the right axilla. -Multiple small hypervascular lesions seen in the right and left lobesconsistent with liver metastasis. -Bone scan on 12/12/2019  did not show any bone metastasis. -CT CAP on 12/29/2019 showed possible lytic lesion in the T4 vertebral body. -Liver biopsy on 12/28/2019 consistent with metastatic breast cancer. -Paclitaxel weekly 3 weeks on 1 week off started on 12/31/2019. -CT CAP on 03/20/2020 shows mild decrease in the left axillary, subpectoral, supraclavicular, mediastinal and lower jugular lymphadenopathy. Stable small low-attenuation liver lesion. No new progressive metastatic disease. Stable lesion in the left sternal manubrium and T4 vertebral body. Stable short segment concentric wall thickening of the right colon with subcentimeter pericolonic lymph nodes. We will monitor this finding as she has metastatic breast cancer. She is asymptomatic. -CT CAP on 06/18/1998 6:22 cycles of chemotherapy showed no new or progressive metastatic disease. Similar size of the index and nonindex left axillary subpectoral, supraclavicular and lower jugular lymphadenopathy.  2. Right colonic mass: -CT scan showed constricting mass involving the ascending colon measuring 3.6 x 3.5 cm with a small 1 cm right pericolonic lymph node. -She never had a colonoscopy. Denies any bleeding per rectum or melena.  3. Left leg DVT: -Doppler on 03/03/2020 shows left femoral vein, popliteal vein and posterior tibial vein deep vein thrombosis.   PLAN:  1. Metastatic left breast cancerto the liver: -CT scan from 09/11/2020 showed mixed response with decrease in size of the breast mass and lymph node in the  left axilla.  Sternal lesion has increased in new liver lesions. - We have discontinued Xeloda.  She complained of left breast and the left upper extremity pain. - We talked to her about starting her on Doxil 50 mg per metered square every 4 weeks.  We discussed side effects including CHF, hand-foot skin reaction, mucositis, cytopenias among others.  She gives [place] permission to proceed with treatment. - We have done echocardiogram on 09/19/2020 which showed EF 60-65%. - We will proceed with her first cycle today with dose reduction at 30 mg per metered square of Doxil.  I have reviewed her LFTs which are grossly within normal limits. - RTC 4 weeks for follow-up.  2. Right colonic mass: -She continues to have occasional rectal bleeding.  She will follow-up with colonoscopy with Dr. Sydell Axon.  3.Peripheral neuropathy: -She has neuropathy in the feet which is stable.  Neuropathy in the fingertips has improved since paclitaxel discontinued.  4.Left leg DVT: -Continue Eliquis twice daily.  5.  Postmenopausal bleeding: - She reports blood in the urine every time she urinates with no dysuria. - I think it is more likely vaginal bleeding. - We reviewed ultrasound of the pelvis from 09/23/2020 which showed thickened endometrial stripe measuring up to 19.3 mm. - We have made a referral to Dr. Elonda Husky   Orders placed this encounter:  Orders Placed This Encounter  Procedures  . Ambulatory referral to Gynecology     [name], MD [place] [phone]   I, Milinda Antis, am acting as a scribe for Dr. Sanda Linger.  I, [name] MD, have reviewed the above documentation for accuracy and completeness, and I agree with the above.

## 2020-10-02 NOTE — Patient Instructions (Signed)
[facility]  Discharge Instructions: Thank you for choosing [facility] to provide your oncology and hematology care.  If you have a lab appointment with the Cancer Center, please come in thru the Main Entrance and check in at the main information desk.  Wear comfortable clothing and clothing appropriate for easy access to any Portacath or PICC line.   We strive to give you quality time with your provider. You may need to reschedule your appointment if you arrive late (15 or more minutes).  Arriving late affects you and other patients whose appointments are after yours.  Also, if you miss three or more appointments without notifying the office, you may be dismissed from the clinic at the provider's discretion.      For prescription refill requests, have your pharmacy contact our office and allow 72 hours for refills to be completed.        To help prevent nausea and vomiting after your treatment, we encourage you to take your nausea medication as directed.  BELOW ARE SYMPTOMS THAT SHOULD BE REPORTED IMMEDIATELY: *FEVER GREATER THAN 100.4 F (38 C) OR HIGHER *CHILLS OR SWEATING *NAUSEA AND VOMITING THAT IS NOT CONTROLLED WITH YOUR NAUSEA MEDICATION *UNUSUAL SHORTNESS OF BREATH *UNUSUAL BRUISING OR BLEEDING *URINARY PROBLEMS (pain or burning when urinating, or frequent urination) *BOWEL PROBLEMS (unusual diarrhea, constipation, pain near the anus) TENDERNESS IN MOUTH AND THROAT WITH OR WITHOUT PRESENCE OF ULCERS (sore throat, sores in mouth, or a toothache) UNUSUAL RASH, SWELLING OR PAIN  UNUSUAL VAGINAL DISCHARGE OR ITCHING   Items with * indicate a potential emergency and should be followed up as soon as possible or go to the Emergency Department if any problems should occur.  Please show the CHEMOTHERAPY ALERT CARD or IMMUNOTHERAPY ALERT CARD at check-in to the Emergency Department and triage nurse.  Should you have questions after your visit or need to cancel  or reschedule your appointment, please contact  CANCER CENTER [phone]  and follow the prompts.  Office hours are 8:00 a.m. to 4:30 p.m. Monday - Friday. Please note that voicemails left after 4:00 p.m. may not be returned until the following business day.  We are closed weekends and major holidays. You have access to a nurse at all times for urgent questions. Please call the main number to the clinic [phone] and follow the prompts.  For any non-urgent questions, you may also contact your provider using MyChart. We now offer e-Visits for anyone 18 and older to request care online for non-urgent symptoms. For details visit [email or web address].   Also download the MyChart app! Go to the app store, search "MyChart", open the app, select [facility], and log in with your MyChart username and password.  Due to Covid, a mask is required upon entering the hospital/clinic. If you do not have a mask, one will be given to you upon arrival. For doctor visits, patients may have 1 support person aged 18 or older with them. For treatment visits, patients cannot have anyone with them due to current Covid guidelines and our immunocompromised population.  

## 2020-10-03 ENCOUNTER — Telehealth (HOSPITAL_COMMUNITY): Payer: Self-pay

## 2020-10-03 LAB — CEA: CEA: 14.1 ng/mL — ABNORMAL HIGH (ref 0.0–4.7)

## 2020-10-03 NOTE — Telephone Encounter (Signed)
24 hour call back- no complaints or concerns today. Patient states she feels good today. No issues reported. Patient knows to call clinic for any symptoms that she is unable to manage. Will follow up as scheduled.

## 2020-10-08 ENCOUNTER — Other Ambulatory Visit (HOSPITAL_COMMUNITY): Payer: Self-pay

## 2020-10-08 DIAGNOSIS — Z170 Estrogen receptor positive status [ER+]: Secondary | ICD-10-CM

## 2020-10-08 MED ORDER — HYDROMORPHONE HCL 2 MG PO TABS: 2.0000 mg | ORAL_TABLET | ORAL | 0 refills | Status: DC | PRN

## 2020-10-30 ENCOUNTER — Other Ambulatory Visit: Payer: Self-pay

## 2020-10-30 ENCOUNTER — Inpatient Hospital Stay (HOSPITAL_COMMUNITY): Payer: Medicare HMO | Attending: Hematology

## 2020-10-30 ENCOUNTER — Inpatient Hospital Stay (HOSPITAL_COMMUNITY): Payer: Medicare HMO

## 2020-10-30 ENCOUNTER — Encounter (HOSPITAL_COMMUNITY): Payer: Self-pay

## 2020-10-30 ENCOUNTER — Ambulatory Visit (HOSPITAL_COMMUNITY): Payer: Medicare HMO | Admitting: Hematology

## 2020-10-30 VITALS — BP 119/66 | HR 110 | Temp 96.9°F | Resp 20

## 2020-10-30 VITALS — BP 105/78 | HR 105 | Temp 97.2°F | Resp 19

## 2020-10-30 DIAGNOSIS — C50812 Malignant neoplasm of overlapping sites of left female breast: Secondary | ICD-10-CM

## 2020-10-30 DIAGNOSIS — C9000 Multiple myeloma not having achieved remission: Secondary | ICD-10-CM

## 2020-10-30 DIAGNOSIS — Z5111 Encounter for antineoplastic chemotherapy: Secondary | ICD-10-CM | POA: Insufficient documentation

## 2020-10-30 DIAGNOSIS — Z95828 Presence of other vascular implants and grafts: Secondary | ICD-10-CM

## 2020-10-30 DIAGNOSIS — Z170 Estrogen receptor positive status [ER+]: Secondary | ICD-10-CM

## 2020-10-30 LAB — COMPREHENSIVE METABOLIC PANEL
ALT: 16 U/L (ref 0–44)
AST: 32 U/L (ref 15–41)
Albumin: 3.3 g/dL — ABNORMAL LOW (ref 3.5–5.0)
Alkaline Phosphatase: 78 U/L (ref 38–126)
Anion gap: 8 (ref 5–15)
BUN: 18 mg/dL (ref 8–23)
CO2: 26 mmol/L (ref 22–32)
Calcium: 8.7 mg/dL — ABNORMAL LOW (ref 8.9–10.3)
Chloride: 100 mmol/L (ref 98–111)
Creatinine, Ser: 0.72 mg/dL (ref 0.44–1.00)
GFR, Estimated: >60 mL/min (ref >60)
Glucose, Bld: 133 mg/dL — ABNORMAL HIGH (ref 70–99)
Potassium: 3.4 mmol/L — ABNORMAL LOW (ref 3.5–5.1)
Sodium: 134 mmol/L — ABNORMAL LOW (ref 135–145)
Total Bilirubin: 0.4 mg/dL (ref 0.3–1.2)
Total Protein: 7.3 g/dL (ref 6.5–8.1)

## 2020-10-30 LAB — CBC WITH DIFFERENTIAL/PLATELET
Abs Immature Granulocytes: 0.04 10*3/uL (ref 0.00–0.07)
Basophils Absolute: 0.1 10*3/uL (ref 0.0–0.1)
Basophils Relative: 1 %
Eosinophils Absolute: 0 10*3/uL (ref 0.0–0.5)
Eosinophils Relative: 1 %
HCT: 28.6 % — ABNORMAL LOW (ref 36.0–46.0)
Hemoglobin: 8.9 g/dL — ABNORMAL LOW (ref 12.0–15.0)
Immature Granulocytes: 1 %
Lymphocytes Relative: 19 %
Lymphs Abs: 1.4 10*3/uL (ref 0.7–4.0)
MCH: 28.4 pg (ref 26.0–34.0)
MCHC: 31.1 g/dL (ref 30.0–36.0)
MCV: 91.4 fL (ref 80.0–100.0)
Monocytes Absolute: 0.9 10*3/uL (ref 0.1–1.0)
Monocytes Relative: 13 %
Neutro Abs: 4.9 10*3/uL (ref 1.7–7.7)
Neutrophils Relative %: 65 %
Platelets: 368 10*3/uL (ref 150–400)
RBC: 3.13 MIL/uL — ABNORMAL LOW (ref 3.87–5.11)
RDW: 14.7 % (ref 11.5–15.5)
WBC: 7.4 10*3/uL (ref 4.0–10.5)
nRBC: 0 % (ref 0.0–0.2)

## 2020-10-30 MED ORDER — PALONOSETRON HCL INJECTION 0.25 MG/5ML
INTRAVENOUS | Status: AC
  Filled 2020-10-30: qty 5

## 2020-10-30 MED ORDER — PALONOSETRON HCL INJECTION 0.25 MG/5ML
0.2500 mg | Freq: Once | INTRAVENOUS | Status: AC
  Administered 2020-10-30: 0.25 mg via INTRAVENOUS

## 2020-10-30 MED ORDER — DEXTROSE 5 % IV SOLN: Freq: Once | INTRAVENOUS | Status: AC

## 2020-10-30 MED ORDER — HEPARIN SOD (PORK) LOCK FLUSH 100 UNIT/ML IV SOLN
500.0000 [IU] | Freq: Once | INTRAVENOUS | Status: AC | PRN
  Administered 2020-10-30: 500 [IU]

## 2020-10-30 MED ORDER — DOXORUBICIN HCL LIPOSOMAL CHEMO INJECTION 2 MG/ML
29.0000 mg/m2 | Freq: Once | INTRAVENOUS | Status: AC
  Administered 2020-10-30: 70 mg via INTRAVENOUS
  Filled 2020-10-30: qty 10

## 2020-10-30 MED ORDER — SODIUM CHLORIDE 0.9 % IV SOLN
10.0000 mg | Freq: Once | INTRAVENOUS | Status: AC
  Administered 2020-10-30: 10 mg via INTRAVENOUS
  Filled 2020-10-30: qty 10

## 2020-10-30 MED ORDER — SODIUM CHLORIDE 0.9% FLUSH
10.0000 mL | INTRAVENOUS | Status: DC | PRN
  Administered 2020-10-30: 10 mL

## 2020-10-30 NOTE — Progress Notes (Signed)
OK to proceed with treatment with labs and vitals.  Dr Delton Coombes wants to keep dose at 30 mg/m2 since he was unable to see her today as he is out sick.  Today's dose updated and signed per MD request.  T.O. Dr Rhys Martini, PharmD

## 2020-10-30 NOTE — Patient Instructions (Signed)
[facility]  Discharge Instructions: Thank you for choosing [facility] to provide your oncology and hematology care.  If you have a lab appointment with the [facility], please come in thru the Main Entrance and check in at the main information desk.  Wear comfortable clothing and clothing appropriate for easy access to any Portacath or PICC line.   We strive to give you quality time with your provider. You may need to reschedule your appointment if you arrive late (15 or more minutes).  Arriving late affects you and other patients whose appointments are after yours.  Also, if you miss three or more appointments without notifying the office, you may be dismissed from the clinic at the provider's discretion.      For prescription refill requests, have your pharmacy contact our office and allow 72 hours for refills to be completed.    Today you received the following chemotherapy and/or immunotherapy agents Doxil.     To help prevent nausea and vomiting after your treatment, we encourage you to take your nausea medication as directed.  BELOW ARE SYMPTOMS THAT SHOULD BE REPORTED IMMEDIATELY: . *FEVER GREATER THAN 100.4 F (38 C) OR HIGHER . *CHILLS OR SWEATING . *NAUSEA AND VOMITING THAT IS NOT CONTROLLED WITH YOUR NAUSEA MEDICATION . *UNUSUAL SHORTNESS OF BREATH . *UNUSUAL BRUISING OR BLEEDING . *URINARY PROBLEMS (pain or burning when urinating, or frequent urination) . *BOWEL PROBLEMS (unusual diarrhea, constipation, pain near the anus) . TENDERNESS IN MOUTH AND THROAT WITH OR WITHOUT PRESENCE OF ULCERS (sore throat, sores in mouth, or a toothache) . UNUSUAL RASH, SWELLING OR PAIN  . UNUSUAL VAGINAL DISCHARGE OR ITCHING   Items with * indicate a potential emergency and should be followed up as soon as possible or go to the Emergency Department if any problems should occur.  Please show the CHEMOTHERAPY ALERT CARD or IMMUNOTHERAPY ALERT CARD at check-in to the  Emergency Department and triage nurse.  Should you have questions after your visit or need to cancel or reschedule your appointment, please contact [facility] [phone]  and follow the prompts.  Office hours are 8:00 a.m. to 4:30 p.m. Monday - Friday. Please note that voicemails left after 4:00 p.m. may not be returned until the following business day.  We are closed weekends and major holidays. You have access to a nurse at all times for urgent questions. Please call the main number to the clinic [phone] and follow the prompts.  For any non-urgent questions, you may also contact your provider using MyChart. We now offer e-Visits for anyone 36 and older to request care online for non-urgent symptoms. For details visit [email or web address].   Also download the MyChart app! Go to the app store, search "MyChart", open the app, select [facility], and log in with your MyChart username and password.  Due to Covid, a mask is required upon entering the hospital/clinic. If you do not have a mask, one will be given to you upon arrival. For doctor visits, patients may have 1 support person aged 54 or older with them. For treatment visits, patients cannot have anyone with them due to current Covid guidelines and our immunocompromised population.

## 2020-10-30 NOTE — Progress Notes (Signed)
Patient presents today for treatment. Vital signs within parameters for treatment. Labs within parameters for treatment. Patient has no complaints of any significant changes since her last visit. MAR reviewed and updated.  Patient complains of left arm pain which MD is aware. See MAR.  Patient teaching performed about pain medication administration. Understanding verbalized. Patient has complaints of darkened skin on fingers and palms of hands bilateral that is unchanged and MD aware.   Message received from [facility] / Dr. Delton Coombes to proceed with treatment. Lower dose on Doxil per message.   Treatment given today per MD orders. Tolerated infusion without adverse affects. Vital signs stable. No complaints at this time. Discharged from via wheel chair in stable condition. Alert and oriented x 3. F/U with [facility] as scheduled.

## 2020-10-31 LAB — CANCER ANTIGEN 15-3: CA 15-3: <1 U/mL (ref 0.0–25.0)

## 2020-11-11 ENCOUNTER — Other Ambulatory Visit (HOSPITAL_COMMUNITY): Payer: Medicare HMO

## 2020-11-12 ENCOUNTER — Ambulatory Visit: Payer: Medicare HMO | Admitting: Gastroenterology

## 2020-11-12 ENCOUNTER — Other Ambulatory Visit: Payer: Self-pay

## 2020-11-12 ENCOUNTER — Ambulatory Visit: Payer: Medicare HMO | Admitting: Nurse Practitioner

## 2020-11-12 ENCOUNTER — Encounter: Payer: Self-pay | Admitting: Gastroenterology

## 2020-11-12 VITALS — BP 129/67 | HR 99 | Temp 96.8°F | Ht 63.0 in | Wt 275.8 lb

## 2020-11-12 DIAGNOSIS — K6389 Other specified diseases of intestine: Secondary | ICD-10-CM

## 2020-11-12 DIAGNOSIS — R110 Nausea: Secondary | ICD-10-CM | POA: Diagnosis not present

## 2020-11-12 DIAGNOSIS — K625 Hemorrhage of anus and rectum: Secondary | ICD-10-CM | POA: Diagnosis not present

## 2020-11-12 DIAGNOSIS — K5900 Constipation, unspecified: Secondary | ICD-10-CM

## 2020-11-12 NOTE — Progress Notes (Signed)
Referring Provider: Lemmie Evens, MD Primary Care Physician:  Lemmie Evens, MD Primary GI Physician:   Chief Complaint  Patient presents with   Rectal Bleeding    Tcs scheduled 6/30   Constipation    Little bit    HPI:   ZORAH BACKES is a 70 y.o. female presenting today for ongoing rectal bleeding and constipation, and nausea. Colonoscopy scheduled for 11/27/20.    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 CT performed 12/12/19 revealed concern for multiple small liver mets as well as annular constricting mass in ascending colon, highly suspicious for primary colon carcinoma w/adjacent right pericolonic lymph nodes consistent with lymph node mets. Colonoscopy recommended then for further eval, however, delayed r/t chemotherapy.  CT chest/abd/pelvis 03/20/20 revealed Stable short segment concentric wall thickening of right colon, with sub-centimeter pericolonic lymph nodes; primary colon carcinoma could not be excluded, colonoscopy was still recommended for further evaluation.   Most recent CT chest/abd/pelvis 09/15/20: concerning for new hepatic mets with multiple lesions to both R and L lobes. No biliary duct dilation noted. Colonic diverticulosis present.  Patient initially scheduled for colonoscopy 01/07/20 and again 09/01/20, however had to cancel both times due to a fall.   Rectal bleeding: states she notices BRB when she wipes after having a BM, also notices a few drops of blood in the toilet at times. Denies blood covered stools. States she has ongoing vaginal bleeding as well so at times it is difficult to decipher source of bleeding. Most recent hgb 10/30/20 8.9, trending down from 9-10 over the past few months.  Constipation: She reports almost daily BMs, with an occasional day in between. Reports some straining to have a BM. Taking miralax as needed  with good result. Also drinking more water which has helped.   Nausea:Patient endorses some nausea postprandial, sometimes up to 1-2 hours after. Reports occasional vomiting. Denies dysphagia, odynophagia or early satiety. She does have prescription for compazine that she uses as needed with good result.  Last Colonoscopy: Never Last Endoscopy: Never Recommendations:  Proceed with planned colonoscopy 11/27/20  Personal GI History: none Family GI HIstory: Mother deceased at 67, patient suspects mother had colon cancer r/t prescence of ostomy  Past Medical History:  Diagnosis Date   Anemia    Arthritis    per patient " left knee"   Essential hypertension, benign    Family history of cancer of female genital organ    Family history of GI tract cancer    Metastatic breast cancer ([place])    left breast   Port-A-Cath in place 12/25/2019   Type 2 diabetes mellitus ([place])     Past Surgical History:  Procedure Laterality Date   CATARACT EXTRACTION W/ INTRAOCULAR LENS IMPLANT Right    HYSTEROSCOPY WITH D & C N/A 06/05/2014   Procedure: DILATATION AND CURETTAGE /HYSTEROSCOPY;  Surgeon: Florian Buff, MD;  Location: [facility];  Service: Gynecology;  Laterality: N/A;   POLYPECTOMY N/A 06/05/2014   Procedure: ENDOMETRIAL POLYPECTOMY;  Surgeon: Florian Buff, MD;  Location: [facility];  Service: Gynecology;  Laterality: N/A;   PORTACATH PLACEMENT N/A 12/14/2019   Procedure: INSERTION PORT-A-CATH WITH ULTRASOUND GUIDANCE;  Surgeon: Donnie Mesa, MD;  Location: [facility];  Service: General;  Laterality: N/A;   TUBAL LIGATION      Current Outpatient Medications  Medication Sig Dispense Refill   apixaban (ELIQUIS) 5 MG TABS  tablet Take 1 tablet (5 mg total) by mouth 2 (two) times daily. 60 tablet 3   chlorpheniramine-HYDROcodone (TUSSIONEX) 10-8 MG/5ML SUER SMARTSIG:1 Teaspoon By Mouth Twice Daily (Patient taking differently: Take 5 mLs by mouth 2 (two) times daily. SMARTSIG:1 Teaspoon By Mouth Twice Daily) 480  mL 0   ferrous sulfate 325 (65 FE) MG tablet Take 325 mg by mouth daily with breakfast.     hydrochlorothiazide (HYDRODIURIL) 25 MG tablet Take 25 mg by mouth daily.     HYDROcodone-acetaminophen (NORCO/VICODIN) 5-325 MG tablet Take 1 tablet by mouth every 6 (six) hours as needed for moderate pain. 15 tablet 0   HYDROmorphone (DILAUDID) 2 MG tablet Take 1 tablet (2 mg total) by mouth every 4 (four) hours as needed for severe pain. 30 tablet 0   KLOR-CON M10 10 MEQ tablet Take 10 mEq by mouth daily.     losartan (COZAAR) 50 MG tablet Take 50 mg by mouth daily.     metFORMIN (GLUCOPHAGE) 500 MG tablet Take 500 mg by mouth daily with breakfast.      potassium chloride (MICRO-K) 10 MEQ CR capsule Take 10 mEq by mouth daily.      pravastatin (PRAVACHOL) 10 MG tablet Take 10 mg by mouth daily.      prochlorperazine (COMPAZINE) 10 MG tablet Take 1 tablet (10 mg total) by mouth every 6 (six) hours as needed (Nausea or vomiting). 30 tablet 1   benzonatate (TESSALON) 100 MG capsule Take 100 mg by mouth 3 (three) times daily as needed for cough. (Patient not taking: Reported on 11/12/2020)     ciprofloxacin (CIPRO) 500 MG tablet Take 1 tablet (500 mg total) by mouth 2 (two) times daily. (Patient not taking: Reported on 11/12/2020) 14 tablet 0   pantoprazole (PROTONIX) 40 MG tablet Take 40 mg by mouth daily. (Patient not taking: Reported on 11/12/2020)     No current facility-administered medications for this visit.    Allergies as of 11/12/2020   (No Known Allergies)    Family History  Problem Relation Age of Onset   Diabetes Mellitus II Father    Congestive Heart Failure Father    Colon cancer Mother 98       patient not sure if colon vs stomach   Stroke Maternal Grandmother    Cancer Cousin        female reproductive cancer, dx. 50s/60s    Social History   Socioeconomic History   Marital status: Married    Spouse name: Herbie [place]   Number of children: 2   Years of education: Not on file    Highest education level: Not on file  Occupational History   Occupation: retired  Tobacco Use   Smoking status: Former    Pack years: 0.00    Types: Cigarettes   Smokeless tobacco: Never   Tobacco comments:    quit about 40+ years ago  Vaping Use   Vaping Use: Never used  Substance and Sexual Activity   Alcohol use: Never   Drug use: No   Sexual activity: Not on file  Other Topics Concern   Not on file  Social History Narrative   Not on file   Social Determinants of Health   Financial Resource Strain: Low Risk    Difficulty of Paying Living Expenses: Not hard at all  Food Insecurity: No Food Insecurity   Worried About Charity fundraiser in the Last Year: Never true   [place] in the Last Year: Never  true  Transportation Needs: No Transportation Needs   Lack of Transportation (Medical): No   Lack of Transportation (Non-Medical): No  Physical Activity: Inactive   Days of Exercise per Week: 0 days   Minutes of Exercise per Session: 0 min  Stress: No Stress Concern Present   Feeling of Stress : Only a little  Social Connections: Moderately Integrated   Frequency of Communication with Friends and Family: Three times a week   Frequency of Social Gatherings with Friends and Family: Three times a week   Attends Religious Services: 1 to 4 times per year   Active Member of Clubs or Organizations: No   Attends Archivist Meetings: Never   Marital Status: Married    Review of Systems: Gen: Denies fever, chills, anorexia. Denies fatigue, weakness, weight loss.  CV: Denies chest pain, palpitations, syncope, peripheral edema, and claudication. Resp: Denies dyspnea at rest, cough, wheezing, coughing up blood, and pleurisy. GI: Denies vomiting blood, jaundice, and fecal incontinence.   Denies dysphagia or odynophagia. Endorses rectal bleeding. Derm: Denies rash, itching, dry skin Psych: Denies depression, anxiety, memory loss, confusion. No homicidal or suicidal  ideation.  Heme: Denies bruising, bleeding, and enlarged lymph nodes.  Physical Exam: BP 129/67   Pulse 99   Temp (!) 96.8 F (36 C) (Temporal)   Ht 5\' 3"  (1.6 m)   Wt 275 lb 12.8 oz (125.1 kg)   BMI 48.86 kg/m  General:   Alert and oriented. No distress noted. Pleasant and cooperative.  Head:  Normocephalic and atraumatic. Eyes:  Conjuctiva clear without scleral icterus. Mouth:  Oral mucosa pink and moist. Good dentition. No lesions. Abdomen:  +BS, soft, non-tender and non-distended. No rebound or guarding. No HSM or masses noted. Limited exam as patient sitting in chair and unable to get up on exam table.  Msk:  Symmetrical without gross deformities. Normal posture. Extremities:  Without edema. Neurologic:  Alert and  oriented x4 Psych:  Alert and cooperative. Normal mood and affect.  ASSESSMENT: LAKINDRA WIBLE is a 70 y.o. female presenting today for ongoing rectal bleeding, constipation and nausea. Patient scheduled for colonoscopy 11/27/20 r/t concern for colonic mass noted on multiple prior CTs after diagnosis of infiltrative estrogen positive ductal carcinoma of R breast on 11/16/19. CEA 14.1.Multiple previous CTs also concerning for hepatic mets. LFTs remain WNL at this time. She continues to have some constipation, however she reports BMs usually every day or every 1-2 days, and clinically is not obstructed. Continued BRBPR and hemoglobin trending down over the past few months, last hgb 8.9 10/30/20, however, patient is currently on chemotherapy. Continued postprandial nausea, typically without associated emesis, has PRN compazine which helps.   Keep plans for colonoscopy upcoming. As she is on Eliquis, this will be held for 48 hours prior.    PLAN:  Proceed with scheduled colonoscopy with Propofol by Dr. Gala Romney on 11/27/20. The risks and benefits were discussed with patient with stated understanding.  Hold Eliquis 48 hours prior. Hold iron 1 week prior.  3.  Continue to drink  plenty of water, take miralax as needed and incorporate more fruits and veggies in diet as tolerated. Will avoid supplemental fiber as focus more on Miralax to help stool pass through in light of concern for colonic mass 4.  Continue with compazine as needed for nausea every 6 hours.  Follow Up: 6 weeks   Chelsea L. Alver Sorrow, MSN, APRN, AGNP-C [facility]  I agree with above. Patient seen  in conjunction together today.  Annitta Needs, PhD, ANP-BC [facility]

## 2020-11-12 NOTE — H&P (View-Only) (Signed)
Referring Provider: Lemmie Evens, MD Primary Care Physician:  Lemmie Evens, MD Primary GI Physician:   Chief Complaint  Patient presents with   Rectal Bleeding    Tcs scheduled 6/30   Constipation    Little bit    HPI:   Crystal Vega is a 70 y.o. female presenting today for ongoing rectal bleeding and constipation, and nausea. Colonoscopy scheduled for 11/27/20.    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 CT performed 12/12/19 revealed concern for multiple small liver mets as well as annular constricting mass in ascending colon, highly suspicious for primary colon carcinoma w/adjacent right pericolonic lymph nodes consistent with lymph node mets. Colonoscopy recommended then for further eval, however, delayed r/t chemotherapy.  CT chest/abd/pelvis 03/20/20 revealed Stable short segment concentric wall thickening of right colon, with sub-centimeter pericolonic lymph nodes; primary colon carcinoma could not be excluded, colonoscopy was still recommended for further evaluation.   Most recent CT chest/abd/pelvis 09/15/20: concerning for new hepatic mets with multiple lesions to both R and L lobes. No biliary duct dilation noted. Colonic diverticulosis present.  Patient initially scheduled for colonoscopy 01/07/20 and again 09/01/20, however had to cancel both times due to a fall.   Rectal bleeding: states she notices BRB when she wipes after having a BM, also notices a few drops of blood in the toilet at times. Denies blood covered stools. States she has ongoing vaginal bleeding as well so at times it is difficult to decipher source of bleeding. Most recent hgb 10/30/20 8.9, trending down from 9-10 over the past few months.  Constipation: She reports almost daily BMs, with an occasional day in between. Reports some straining to have a BM. Taking miralax as needed  with good result. Also drinking more water which has helped.   Nausea:Patient endorses some nausea postprandial, sometimes up to 1-2 hours after. Reports occasional vomiting. Denies dysphagia, odynophagia or early satiety. She does have prescription for compazine that she uses as needed with good result.  Last Colonoscopy: Never Last Endoscopy: Never Recommendations:  Proceed with planned colonoscopy 11/27/20  Personal GI History: none Family GI HIstory: Mother deceased at 47, patient suspects mother had colon cancer r/t prescence of ostomy  Past Medical History:  Diagnosis Date   Anemia    Arthritis    per patient " left knee"   Essential hypertension, benign    Family history of cancer of female genital organ    Family history of GI tract cancer    Metastatic breast cancer ([place])    left breast   Port-A-Cath in place 12/25/2019   Type 2 diabetes mellitus ([place])     Past Surgical History:  Procedure Laterality Date   CATARACT EXTRACTION W/ INTRAOCULAR LENS IMPLANT Right    HYSTEROSCOPY WITH D & C N/A 06/05/2014   Procedure: DILATATION AND CURETTAGE /HYSTEROSCOPY;  Surgeon: Florian Buff, MD;  Location: [facility];  Service: Gynecology;  Laterality: N/A;   POLYPECTOMY N/A 06/05/2014   Procedure: ENDOMETRIAL POLYPECTOMY;  Surgeon: Florian Buff, MD;  Location: [facility];  Service: Gynecology;  Laterality: N/A;   PORTACATH PLACEMENT N/A 12/14/2019   Procedure: INSERTION PORT-A-CATH WITH ULTRASOUND GUIDANCE;  Surgeon: Donnie Mesa, MD;  Location: [facility];  Service: General;  Laterality: N/A;   TUBAL LIGATION      Current Outpatient Medications  Medication Sig Dispense Refill   apixaban (ELIQUIS) 5 MG TABS  tablet Take 1 tablet (5 mg total) by mouth 2 (two) times daily. 60 tablet 3   chlorpheniramine-HYDROcodone (TUSSIONEX) 10-8 MG/5ML SUER SMARTSIG:1 Teaspoon By Mouth Twice Daily (Patient taking differently: Take 5 mLs by mouth 2 (two) times daily. SMARTSIG:1 Teaspoon By Mouth Twice Daily) 480  mL 0   ferrous sulfate 325 (65 FE) MG tablet Take 325 mg by mouth daily with breakfast.     hydrochlorothiazide (HYDRODIURIL) 25 MG tablet Take 25 mg by mouth daily.     HYDROcodone-acetaminophen (NORCO/VICODIN) 5-325 MG tablet Take 1 tablet by mouth every 6 (six) hours as needed for moderate pain. 15 tablet 0   HYDROmorphone (DILAUDID) 2 MG tablet Take 1 tablet (2 mg total) by mouth every 4 (four) hours as needed for severe pain. 30 tablet 0   KLOR-CON M10 10 MEQ tablet Take 10 mEq by mouth daily.     losartan (COZAAR) 50 MG tablet Take 50 mg by mouth daily.     metFORMIN (GLUCOPHAGE) 500 MG tablet Take 500 mg by mouth daily with breakfast.      potassium chloride (MICRO-K) 10 MEQ CR capsule Take 10 mEq by mouth daily.      pravastatin (PRAVACHOL) 10 MG tablet Take 10 mg by mouth daily.      prochlorperazine (COMPAZINE) 10 MG tablet Take 1 tablet (10 mg total) by mouth every 6 (six) hours as needed (Nausea or vomiting). 30 tablet 1   benzonatate (TESSALON) 100 MG capsule Take 100 mg by mouth 3 (three) times daily as needed for cough. (Patient not taking: Reported on 11/12/2020)     ciprofloxacin (CIPRO) 500 MG tablet Take 1 tablet (500 mg total) by mouth 2 (two) times daily. (Patient not taking: Reported on 11/12/2020) 14 tablet 0   pantoprazole (PROTONIX) 40 MG tablet Take 40 mg by mouth daily. (Patient not taking: Reported on 11/12/2020)     No current facility-administered medications for this visit.    Allergies as of 11/12/2020   (No Known Allergies)    Family History  Problem Relation Age of Onset   Diabetes Mellitus II Father    Congestive Heart Failure Father    Colon cancer Mother 53       patient not sure if colon vs stomach   Stroke Maternal Grandmother    Cancer Cousin        female reproductive cancer, dx. 50s/60s    Social History   Socioeconomic History   Marital status: Married    Spouse name: Crystal Vega   Number of children: 2   Years of education: Not on file    Highest education level: Not on file  Occupational History   Occupation: retired  Tobacco Use   Smoking status: Former    Pack years: 0.00    Types: Cigarettes   Smokeless tobacco: Never   Tobacco comments:    quit about 40+ years ago  Vaping Use   Vaping Use: Never used  Substance and Sexual Activity   Alcohol use: Never   Drug use: No   Sexual activity: Not on file  Other Topics Concern   Not on file  Social History Narrative   Not on file   Social Determinants of Health   Financial Resource Strain: Low Risk    Difficulty of Paying Living Expenses: Not hard at all  Food Insecurity: No Food Insecurity   Worried About Charity fundraiser in the Last Year: Never true   [place] in the Last Year: Never  true  Transportation Needs: No Transportation Needs   Lack of Transportation (Medical): No   Lack of Transportation (Non-Medical): No  Physical Activity: Inactive   Days of Exercise per Week: 0 days   Minutes of Exercise per Session: 0 min  Stress: No Stress Concern Present   Feeling of Stress : Only a little  Social Connections: Moderately Integrated   Frequency of Communication with Friends and Family: Three times a week   Frequency of Social Gatherings with Friends and Family: Three times a week   Attends Religious Services: 1 to 4 times per year   Active Member of Clubs or Organizations: No   Attends Archivist Meetings: Never   Marital Status: Married    Review of Systems: Gen: Denies fever, chills, anorexia. Denies fatigue, weakness, weight loss.  CV: Denies chest pain, palpitations, syncope, peripheral edema, and claudication. Resp: Denies dyspnea at rest, cough, wheezing, coughing up blood, and pleurisy. GI: Denies vomiting blood, jaundice, and fecal incontinence.   Denies dysphagia or odynophagia. Endorses rectal bleeding. Derm: Denies rash, itching, dry skin Psych: Denies depression, anxiety, memory loss, confusion. No homicidal or suicidal  ideation.  Heme: Denies bruising, bleeding, and enlarged lymph nodes.  Physical Exam: BP 129/67   Pulse 99   Temp (!) 96.8 F (36 C) (Temporal)   Ht 5\' 3"  (1.6 m)   Wt 275 lb 12.8 oz (125.1 kg)   BMI 48.86 kg/m  General:   Alert and oriented. No distress noted. Pleasant and cooperative.  Head:  Normocephalic and atraumatic. Eyes:  Conjuctiva clear without scleral icterus. Mouth:  Oral mucosa pink and moist. Good dentition. No lesions. Abdomen:  +BS, soft, non-tender and non-distended. No rebound or guarding. No HSM or masses noted. Limited exam as patient sitting in chair and unable to get up on exam table.  Msk:  Symmetrical without gross deformities. Normal posture. Extremities:  Without edema. Neurologic:  Alert and  oriented x4 Psych:  Alert and cooperative. Normal mood and affect.  ASSESSMENT: Crystal Vega is a 70 y.o. female presenting today for ongoing rectal bleeding, constipation and nausea. Patient scheduled for colonoscopy 11/27/20 r/t concern for colonic mass noted on multiple prior CTs after diagnosis of infiltrative estrogen positive ductal carcinoma of R breast on 11/16/19. CEA 14.1.Multiple previous CTs also concerning for hepatic mets. LFTs remain WNL at this time. She continues to have some constipation, however she reports BMs usually every day or every 1-2 days, and clinically is not obstructed. Continued BRBPR and hemoglobin trending down over the past few months, last hgb 8.9 10/30/20, however, patient is currently on chemotherapy. Continued postprandial nausea, typically without associated emesis, has PRN compazine which helps.   Keep plans for colonoscopy upcoming. As she is on Eliquis, this will be held for 48 hours prior.    PLAN:  Proceed with scheduled colonoscopy with Propofol by Dr. Gala Romney on 11/27/20. The risks and benefits were discussed with patient with stated understanding.  Hold Eliquis 48 hours prior. Hold iron 1 week prior.  3.  Continue to drink  plenty of water, take miralax as needed and incorporate more fruits and veggies in diet as tolerated. Will avoid supplemental fiber as focus more on Miralax to help stool pass through in light of concern for colonic mass 4.  Continue with compazine as needed for nausea every 6 hours.  Follow Up: 6 weeks   Chelsea L. Alver Sorrow, MSN, APRN, AGNP-C [facility]  I agree with above. Patient seen  in conjunction together today.  Annitta Needs, PhD, ANP-BC [facility]

## 2020-11-12 NOTE — Patient Instructions (Addendum)
Continue with plan for scheduled colonoscopy on 6/30.  Continue to take Miralax daily on any given day you do not have a bowel movement.  Continue drinking plenty of water, eat fruits and veggies as tolerated. We will avoid supplemental fiber for now and get it from your diet mainly.   You can take your compazine as needed every 6 hours when you feel nauseated.   Please hold your eliquis for 48 hours prior to your scheduled colonoscopy.    It was a pleasure caring for you today!

## 2020-11-21 NOTE — Patient Instructions (Signed)
Crystal Vega  11/21/2020     @PREFPERIOPPHARMACY @   Your procedure is scheduled on  11/27/2020.   Report to [facility] at  0730 A.M.   Call this number if you have problems the morning of surgery:  [phone]   Remember:  Follow the diet and prep instructions given to you by the office.  DO NOT take any medications for diabetes the morning of your procedure.    Take these medicines the morning of surgery with A SIP OF WATER    hydrocodone (If needed), compazine (if needed).  Your last dose of eliquis should be on 11/24/2020.  Your last dose of iron should be on 11/19/2020.    Please brush your teeth.  Do not wear jewelry, make-up or nail polish.  Do not wear lotions, powders, or perfumes, or deodorant.  Do not shave 48 hours prior to surgery.  Men may shave face and neck.  Do not bring valuables to the hospital.  [facility] is not responsible for any belongings or valuables.  Contacts, dentures or bridgework may not be worn into surgery.  Leave your suitcase in the car.  After surgery it may be brought to your room.  For patients admitted to the hospital, discharge time will be determined by your treatment team.  Patients discharged the day of surgery will not be allowed to drive home and must  have someone with them for 24 hours.    Special instructions:    DO NOT smoke tobacco or vape for 24 hours before your procedure.  Please read over the following fact sheets that you were given. Anesthesia Post-op Instructions and Care and Recovery After Surgery      Colonoscopy, Adult, Care After This sheet gives you information about how to care for yourself after your procedure. Your health care provider may also give you more specific instructions. If you have problems or questions, contact your health careprovider. What can I expect after the procedure? After the procedure, it is common to have: A small amount of blood in your stool for 24 hours after the  procedure. Some gas. Mild cramping or bloating of your abdomen. Follow these instructions at home: Eating and drinking  Drink enough fluid to keep your urine pale yellow. Follow instructions from your health care provider about eating or drinking restrictions. Resume your normal diet as instructed by your health care provider. Avoid heavy or fried foods that are hard to digest.  Activity Rest as told by your health care provider. Avoid sitting for a long time without moving. Get up to take short walks every 1-2 hours. This is important to improve blood flow and breathing. Ask for help if you feel weak or unsteady. Return to your normal activities as told by your health care provider. Ask your health care provider what activities are safe for you. Managing cramping and bloating  Try walking around when you have cramps or feel bloated. Apply heat to your abdomen as told by your health care provider. Use the heat source that your health care provider recommends, such as a moist heat pack or a heating pad. Place a towel between your skin and the heat source. Leave the heat on for 20-30 minutes. Remove the heat if your skin turns bright red. This is especially important if you are unable to feel pain, heat, or cold. You may have a greater risk of getting burned.  General instructions If you were given a sedative  during the procedure, it can affect you for several hours. Do not drive or operate machinery until your health care provider says that it is safe. For the first 24 hours after the procedure: Do not sign important documents. Do not drink alcohol. Do your regular daily activities at a slower pace than normal. Eat soft foods that are easy to digest. Take over-the-counter and prescription medicines only as told by your health care provider. Keep all follow-up visits as told by your health care provider. This is important. Contact a health care provider if: You have blood in your stool  2-3 days after the procedure. Get help right away if you have: More than a small spotting of blood in your stool. Large blood clots in your stool. Swelling of your abdomen. Nausea or vomiting. A fever. Increasing pain in your abdomen that is not relieved with medicine. Summary After the procedure, it is common to have a small amount of blood in your stool. You may also have mild cramping and bloating of your abdomen. If you were given a sedative during the procedure, it can affect you for several hours. Do not drive or operate machinery until your health care provider says that it is safe. Get help right away if you have a lot of blood in your stool, nausea or vomiting, a fever, or increased pain in your abdomen. This information is not intended to replace advice given to you by your health care provider. Make sure you discuss any questions you have with your healthcare provider. Document Revised: 05/11/2019 Document Reviewed: 12/11/2018 Elsevier Patient Education  [address] After This sheet gives you information about how to care for yourself after your procedure. Your health care provider may also give you more specific instructions. If you have problems or questions, contact your health careprovider. What can I expect after the procedure? After the procedure, it is common to have: Tiredness. Forgetfulness about what happened after the procedure. Impaired judgment for important decisions. Nausea or vomiting. Some difficulty with balance. Follow these instructions at home: For the time period you were told by your health care provider:     Rest as needed. Do not participate in activities where you could fall or become injured. Do not drive or use machinery. Do not drink alcohol. Do not take sleeping pills or medicines that cause drowsiness. Do not make important decisions or sign legal documents. Do not take care of children on your  own. Eating and drinking Follow the diet that is recommended by your health care provider. Drink enough fluid to keep your urine pale yellow. If you vomit: Drink water, juice, or soup when you can drink without vomiting. Make sure you have little or no nausea before eating solid foods. General instructions Have a responsible adult stay with you for the time you are told. It is important to have someone help care for you until you are awake and alert. Take over-the-counter and prescription medicines only as told by your health care provider. If you have sleep apnea, surgery and certain medicines can increase your risk for breathing problems. Follow instructions from your health care provider about wearing your sleep device: Anytime you are sleeping, including during daytime naps. While taking prescription pain medicines, sleeping medicines, or medicines that make you drowsy. Avoid smoking. Keep all follow-up visits as told by your health care provider. This is important. Contact a health care provider if: You keep feeling nauseous or you keep vomiting. You feel  light-headed. You are still sleepy or having trouble with balance after 24 hours. You develop a rash. You have a fever. You have redness or swelling around the IV site. Get help right away if: You have trouble breathing. You have new-onset confusion at home. Summary For several hours after your procedure, you may feel tired. You may also be forgetful and have poor judgment. Have a responsible adult stay with you for the time you are told. It is important to have someone help care for you until you are awake and alert. Rest as told. Do not drive or operate machinery. Do not drink alcohol or take sleeping pills. Get help right away if you have trouble breathing, or if you suddenly become confused. This information is not intended to replace advice given to you by your health care provider. Make sure you discuss any questions you  have with your healthcare provider. Document Revised: 01/31/2020 Document Reviewed: 04/19/2019 Elsevier Patient Education  2022 [employer].

## 2020-11-25 ENCOUNTER — Other Ambulatory Visit: Payer: Self-pay

## 2020-11-25 ENCOUNTER — Encounter (HOSPITAL_COMMUNITY): Payer: Self-pay

## 2020-11-25 ENCOUNTER — Other Ambulatory Visit (HOSPITAL_COMMUNITY): Payer: Medicare HMO | Attending: Internal Medicine

## 2020-11-25 ENCOUNTER — Encounter (HOSPITAL_COMMUNITY)
Admission: RE | Admit: 2020-11-25 | Discharge: 2020-11-25 | Disposition: A | Discharge status: Home / Self Care | Payer: Medicare HMO | Source: Ambulatory Visit | Attending: Internal Medicine | Admitting: Internal Medicine

## 2020-11-25 HISTORY — DX: Dyspnea, unspecified: R06.00

## 2020-11-25 HISTORY — DX: Acute embolism and thrombosis of unspecified deep veins of unspecified lower extremity: I82.409

## 2020-11-27 ENCOUNTER — Other Ambulatory Visit: Payer: Self-pay

## 2020-11-27 ENCOUNTER — Ambulatory Visit (HOSPITAL_COMMUNITY)
Admission: RE | Admit: 2020-11-27 | Discharge: 2020-11-27 | Disposition: A | Discharge status: Home / Self Care | Payer: Medicare HMO | Attending: Internal Medicine | Admitting: Internal Medicine

## 2020-11-27 ENCOUNTER — Encounter (HOSPITAL_COMMUNITY)
Admission: RE | Disposition: A | Discharge status: Home / Self Care | Payer: Self-pay | Source: Home / Self Care | Attending: Internal Medicine

## 2020-11-27 ENCOUNTER — Other Ambulatory Visit (HOSPITAL_COMMUNITY): Payer: Medicare HMO

## 2020-11-27 ENCOUNTER — Ambulatory Visit (HOSPITAL_COMMUNITY): Payer: Medicare HMO | Admitting: Hematology and Oncology

## 2020-11-27 ENCOUNTER — Ambulatory Visit (HOSPITAL_COMMUNITY): Payer: Medicare HMO

## 2020-11-27 ENCOUNTER — Ambulatory Visit (HOSPITAL_COMMUNITY): Payer: Medicare HMO | Admitting: Anesthesiology

## 2020-11-27 ENCOUNTER — Encounter (HOSPITAL_COMMUNITY): Payer: Self-pay | Admitting: Internal Medicine

## 2020-11-27 DIAGNOSIS — K5730 Diverticulosis of large intestine without perforation or abscess without bleeding: Secondary | ICD-10-CM | POA: Insufficient documentation

## 2020-11-27 DIAGNOSIS — C50911 Malignant neoplasm of unspecified site of right female breast: Secondary | ICD-10-CM | POA: Diagnosis not present

## 2020-11-27 DIAGNOSIS — K921 Melena: Secondary | ICD-10-CM | POA: Diagnosis present

## 2020-11-27 DIAGNOSIS — Z79899 Other long term (current) drug therapy: Secondary | ICD-10-CM | POA: Diagnosis not present

## 2020-11-27 DIAGNOSIS — Z87891 Personal history of nicotine dependence: Secondary | ICD-10-CM | POA: Diagnosis not present

## 2020-11-27 DIAGNOSIS — R948 Abnormal results of function studies of other organs and systems: Secondary | ICD-10-CM | POA: Diagnosis present

## 2020-11-27 DIAGNOSIS — Z170 Estrogen receptor positive status [ER+]: Secondary | ICD-10-CM | POA: Diagnosis not present

## 2020-11-27 DIAGNOSIS — K5900 Constipation, unspecified: Secondary | ICD-10-CM | POA: Insufficient documentation

## 2020-11-27 DIAGNOSIS — C182 Malignant neoplasm of ascending colon: Secondary | ICD-10-CM | POA: Insufficient documentation

## 2020-11-27 DIAGNOSIS — D120 Benign neoplasm of cecum: Secondary | ICD-10-CM

## 2020-11-27 DIAGNOSIS — Z7984 Long term (current) use of oral hypoglycemic drugs: Secondary | ICD-10-CM | POA: Insufficient documentation

## 2020-11-27 DIAGNOSIS — R110 Nausea: Secondary | ICD-10-CM | POA: Insufficient documentation

## 2020-11-27 HISTORY — PX: BIOPSY: SHX5522

## 2020-11-27 HISTORY — PX: COLONOSCOPY WITH PROPOFOL: SHX5780

## 2020-11-27 LAB — GLUCOSE, CAPILLARY: Glucose-Capillary: 124 mg/dL — ABNORMAL HIGH (ref 70–99)

## 2020-11-27 SURGERY — COLONOSCOPY WITH PROPOFOL
Anesthesia: General

## 2020-11-27 MED ORDER — LACTATED RINGERS IV SOLN: INTRAVENOUS | Status: DC | PRN

## 2020-11-27 MED ORDER — LIDOCAINE HCL (PF) 2 % IJ SOLN
INTRAMUSCULAR | Status: AC
  Filled 2020-11-27: qty 5

## 2020-11-27 MED ORDER — KETAMINE HCL 10 MG/ML IJ SOLN
INTRAMUSCULAR | Status: DC | PRN
  Administered 2020-11-27: 20 mg via INTRAVENOUS

## 2020-11-27 MED ORDER — PROPOFOL 10 MG/ML IV BOLUS
INTRAVENOUS | Status: AC
  Filled 2020-11-27: qty 60

## 2020-11-27 MED ORDER — LACTATED RINGERS IV SOLN: INTRAVENOUS | Status: DC

## 2020-11-27 MED ORDER — KETAMINE HCL 50 MG/5ML IJ SOSY
PREFILLED_SYRINGE | INTRAMUSCULAR | Status: AC
  Filled 2020-11-27: qty 5

## 2020-11-27 MED ORDER — PROPOFOL 500 MG/50ML IV EMUL
INTRAVENOUS | Status: DC | PRN
  Administered 2020-11-27: 25 ug/kg/min via INTRAVENOUS

## 2020-11-27 MED ORDER — PROPOFOL 10 MG/ML IV BOLUS
INTRAVENOUS | Status: DC | PRN
  Administered 2020-11-27: 20 mg via INTRAVENOUS
  Administered 2020-11-27: 50 mg via INTRAVENOUS
  Administered 2020-11-27: 20 mg via INTRAVENOUS

## 2020-11-27 NOTE — Anesthesia Preprocedure Evaluation (Signed)
Anesthesia Evaluation  Patient identified by MRN, date of birth, ID band Patient awake    Reviewed: Allergy & Precautions, H&P , NPO status , Patient's Chart, lab work & pertinent test results, reviewed documented beta blocker date and time   Airway Mallampati: II  TM Distance: >3 FB Neck ROM: full    Dental no notable dental hx.    Pulmonary shortness of breath, former smoker,    Pulmonary exam normal breath sounds clear to auscultation       Cardiovascular Exercise Tolerance: Good hypertension, negative cardio ROS   Rhythm:regular Rate:Normal     Neuro/Psych negative neurological ROS  negative psych ROS   GI/Hepatic negative GI ROS, Neg liver ROS,   Endo/Other  diabetesMorbid obesity  Renal/GU negative Renal ROS  negative genitourinary   Musculoskeletal   Abdominal   Peds  Hematology  (+) Blood dyscrasia, anemia ,   Anesthesia Other Findings   Reproductive/Obstetrics negative OB ROS                             Anesthesia Physical Anesthesia Plan  ASA: 3  Anesthesia Plan: General   Post-op Pain Management:    Induction:   PONV Risk Score and Plan: Propofol infusion  Airway Management Planned:   Additional Equipment:   Intra-op Plan:   Post-operative Plan:   Informed Consent: I have reviewed the patients History and Physical, chart, labs and discussed the procedure including the risks, benefits and alternatives for the proposed anesthesia with the patient or authorized representative who has indicated his/her understanding and acceptance.     Dental Advisory Given  Plan Discussed with: CRNA  Anesthesia Plan Comments:         Anesthesia Quick Evaluation

## 2020-11-27 NOTE — Discharge Instructions (Addendum)
Colonoscopy Discharge Instructions  Read the instructions outlined below and refer to this sheet in the next few weeks. These discharge instructions provide you with general information on caring for yourself after you leave the hospital. Your doctor may also give you specific instructions. While your treatment has been planned according to the most current medical practices available, unavoidable complications occasionally occur. If you have any problems or questions after discharge, call Dr. Gala Romney at [phone]. ACTIVITY You may resume your regular activity, but move at a slower pace for the next 24 hours.  Take frequent rest periods for the next 24 hours.  Walking will help get rid of the air and reduce the bloated feeling in your belly (abdomen).  No driving for 24 hours (because of the medicine (anesthesia) used during the test).   Do not sign any important legal documents or operate any machinery for 24 hours (because of the anesthesia used during the test).  NUTRITION Drink plenty of fluids.  You may resume your normal diet as instructed by your doctor.  Begin with a light meal and progress to your normal diet. Heavy or fried foods are harder to digest and may make you feel sick to your stomach (nauseated).  Avoid alcoholic beverages for 24 hours or as instructed.  MEDICATIONS You may resume your normal medications unless your doctor tells you otherwise.  WHAT YOU CAN EXPECT TODAY Some feelings of bloating in the abdomen.  Passage of more gas than usual.  Spotting of blood in your stool or on the toilet paper.  IF YOU HAD POLYPS REMOVED DURING THE COLONOSCOPY: No aspirin products for 7 days or as instructed.  No alcohol for 7 days or as instructed.  Eat a soft diet for the next 24 hours.  FINDING OUT THE RESULTS OF YOUR TEST Not all test results are available during your visit. If your test results are not back during the visit, make an appointment with your caregiver to find out the  results. Do not assume everything is normal if you have not heard from your caregiver or the medical facility. It is important for you to follow up on all of your test results.  SEEK IMMEDIATE MEDICAL ATTENTION IF: You have more than a spotting of blood in your stool.  Your belly is swollen (abdominal distention).  You are nauseated or vomiting.  You have a temperature over 101.  You have abdominal pain or discomfort that is severe or gets worse throughout the day.     You have a mass in your colon which was biopsied. Further recommendations to follow pending review of pathology report Keep your follow-up appointments with Dr. Delton Coombes.  Resume Eliquis today  At patient request, I called at [facility] at [phone] -reviewed findings and recommendations  PATIENT INSTRUCTIONS POST-ANESTHESIA  IMMEDIATELY FOLLOWING SURGERY:  Do not drive or operate machinery for the first twenty four hours after surgery.  Do not make any important decisions for twenty four hours after surgery or while taking narcotic pain medications or sedatives.  If you develop intractable nausea and vomiting or a severe headache please notify your doctor immediately.  FOLLOW-UP:  Please make an appointment with your surgeon as instructed. You do not need to follow up with anesthesia unless specifically instructed to do so.  WOUND CARE INSTRUCTIONS (if applicable):  Keep a dry clean dressing on the anesthesia/puncture wound site if there is drainage.  Once the wound has quit draining you may leave it open to air.  Generally  you should leave the bandage intact for twenty four hours unless there is drainage.  If the epidural site drains for more than 36-48 hours please call the anesthesia department.  QUESTIONS?:  Please feel free to call your physician or the hospital operator if you have any questions, and they will be happy to assist you.

## 2020-11-27 NOTE — Interval H&P Note (Signed)
History and Physical Interval Note:  11/27/2020 8:48 AM  Crystal Vega  has presented today for surgery, with the diagnosis of h/o colon cancer.  The various methods of treatment have been discussed with the patient and family. After consideration of risks, benefits and other options for treatment, the patient has consented to  Procedure(s) with comments: COLONOSCOPY WITH PROPOFOL (N/A) - 9:00am as a surgical intervention.  The patient's history has been reviewed, patient examined, no change in status, stable for surgery.  I have reviewed the patient's chart and labs.  Questions were answered to the patient's satisfaction.     Stokes Rattigan  No change.  Eliquis held 2 days ago.  First-ever colonoscopy today per plan. The risks, benefits, limitations, alternatives and imponderables have been reviewed with the patient. Questions have been answered. All parties are agreeable.

## 2020-11-27 NOTE — Anesthesia Postprocedure Evaluation (Signed)
Anesthesia Post Note  Patient: Crystal Vega  Procedure(s) Performed: COLONOSCOPY WITH PROPOFOL BIOPSY  Patient location during evaluation: Short Stay Anesthesia Type: General Level of consciousness: awake and alert Pain management: pain level controlled Vital Signs Assessment: post-procedure vital signs reviewed and stable Respiratory status: spontaneous breathing Cardiovascular status: blood pressure returned to baseline and stable Postop Assessment: no apparent nausea or vomiting Anesthetic complications: no   No notable events documented.   Last Vitals:  Vitals:   11/27/20 0814  BP: 105/62  Pulse: (!) 108  Resp: (!) 22  Temp: 36.8 C  SpO2: 98%    Last Pain:  Vitals:   11/27/20 0907  TempSrc:   PainSc: 4                  Walta [place]

## 2020-11-27 NOTE — Transfer of Care (Signed)
Immediate Anesthesia Transfer of Care Note  Patient: Crystal Vega  Procedure(s) Performed: COLONOSCOPY WITH PROPOFOL BIOPSY  Patient Location: Short Stay  Anesthesia Type:General  Level of Consciousness: awake  Airway & Oxygen Therapy: Patient Spontanous Breathing  Post-op Assessment: Report given to RN  Post vital signs: Reviewed  Last Vitals:  Vitals Value Taken Time  BP    Temp    Pulse    Resp    SpO2      Last Pain:  Vitals:   11/27/20 0907  TempSrc:   PainSc: 4          Complications: No notable events documented.

## 2020-11-27 NOTE — Op Note (Signed)
[facility] Patient Name: Crystal Vega Procedure Date: 11/27/2020 8:43 AM MRN: [id number] Date of Birth: [birth year] Attending MD: Norvel Richards , MD CSN: [id number] Age: 70 Admit Type: Outpatient Procedure:                Colonoscopy Indications:              Hematochezia, Abnormal CT of the GI tract Providers:                Norvel Richards, MD, Gwenlyn Fudge, RN, Randa Spike, Technician Referring MD:              Medicines:                Propofol per Anesthesia Complications:            No immediate complications. Estimated Blood Loss:     Estimated blood loss was minimal. Procedure:                Pre-Anesthesia Assessment:                           - Prior to the procedure, a History and Physical                            was performed, and patient medications and                            allergies were reviewed. The patient's tolerance of                            previous anesthesia was also reviewed. The risks                            and benefits of the procedure and the sedation                            options and risks were discussed with the patient.                            All questions were answered, and informed consent                            was obtained. Prior Anticoagulants: The patient                            last took Eliquis (apixaban) 2 days prior to the                            procedure. ASA Grade Assessment: III - A patient                            with severe systemic disease. After reviewing the  risks and benefits, the patient was deemed in                            satisfactory condition to undergo the procedure.                           After obtaining informed consent, the colonoscope                            was passed under direct vision. Throughout the                            procedure, the patient's blood pressure, pulse, and                             oxygen saturations were monitored continuously. The                            CF-HQ190L ([id number]) scope was introduced through                            the anus and advanced to the the cecum, identified                            by appendiceal orifice and ileocecal valve. The                            colonoscopy was performed without difficulty. The                            patient tolerated the procedure well. The quality                            of the bowel preparation was adequate. Scope In: 9:11:33 AM Scope Out: 9:22:33 AM Scope Withdrawal Time: 0 hours 7 minutes 31 seconds  Total Procedure Duration: 0 hours 11 minutes 0 seconds  Findings:      The perianal and digital rectal examinations were normal.      Scattered medium-mouthed diverticula were found in the sigmoid colon and       descending colon. Spiral shaped semilunar, nearly circumferential, apple       core type lesion in the ascending colon spanning approximately 5 to 6 cm       longitudinally. Significant encroachment on the lumen. However, I was       fairly easily able to get the adult colonoscope across it. In the cecum       there was a 2 x 3 cm spiraling sessile adenomatous appearing polypoid       lesion. Please see photos. Cecal lesion was biopsied. No attempt at       removal was undertaken. The core lesion a sitting exam was biopsied       multiple times. Impression:               - Diverticulosis in the sigmoid colon and in the  descending colon.                           -Sessile cecal polyp biopsied but not removed.Marland Kitchen                            Apple core lesion ascending colon status post biopsy Moderate Sedation:      Moderate (conscious) sedation was personally administered by an       anesthesia professional. The following parameters were monitored: oxygen       saturation, heart rate, blood pressure, respiratory rate, EKG, adequacy       of pulmonary ventilation,  and response to care. Recommendation:           - Patient has a contact number available for                            emergencies. The signs and symptoms of potential                            delayed complications were discussed with the                            patient. Return to normal activities tomorrow.                            Written discharge instructions were provided to the                            patient.                           - Advance diet as tolerated.                           - Continue present medications. Follow-up on                            pathology. Further recommendations to follow. Procedure Code(s):        --- Professional ---                           [phone], Colonoscopy, flexible; diagnostic, including                            collection of specimen(s) by brushing or washing,                            when performed (separate procedure) Diagnosis Code(s):        --- Professional ---                           K92.1, Melena (includes Hematochezia)                           K57.30, Diverticulosis of large intestine without  perforation or abscess without bleeding                           R93.3, Abnormal findings on diagnostic imaging of                            other parts of digestive tract CPT copyright 2019 [employer] Medical Association. All rights reserved. The codes documented in this report are preliminary and upon coder review may  be revised to meet current compliance requirements. Cristopher Estimable. Shalicia Craghead, MD Norvel Richards, MD 11/27/2020 9:32:50 AM This report has been signed electronically. Number of Addenda: 0

## 2020-11-28 LAB — SURGICAL PATHOLOGY

## 2020-12-02 ENCOUNTER — Inpatient Hospital Stay (HOSPITAL_COMMUNITY): Payer: Medicare HMO | Attending: Hematology

## 2020-12-02 ENCOUNTER — Other Ambulatory Visit: Payer: Self-pay | Admitting: Hematology and Oncology

## 2020-12-02 ENCOUNTER — Inpatient Hospital Stay (HOSPITAL_COMMUNITY): Payer: Medicare HMO

## 2020-12-02 ENCOUNTER — Other Ambulatory Visit: Payer: Self-pay

## 2020-12-02 VITALS — BP 127/68 | HR 94 | Temp 96.9°F | Resp 18

## 2020-12-02 DIAGNOSIS — Z95828 Presence of other vascular implants and grafts: Secondary | ICD-10-CM

## 2020-12-02 DIAGNOSIS — C50812 Malignant neoplasm of overlapping sites of left female breast: Secondary | ICD-10-CM

## 2020-12-02 DIAGNOSIS — Z5111 Encounter for antineoplastic chemotherapy: Secondary | ICD-10-CM | POA: Diagnosis present

## 2020-12-02 DIAGNOSIS — C9000 Multiple myeloma not having achieved remission: Secondary | ICD-10-CM

## 2020-12-02 LAB — COMPREHENSIVE METABOLIC PANEL
ALT: 13 U/L (ref 0–44)
AST: 32 U/L (ref 15–41)
Albumin: 3.1 g/dL — ABNORMAL LOW (ref 3.5–5.0)
Alkaline Phosphatase: 88 U/L (ref 38–126)
Anion gap: 10 (ref 5–15)
BUN: 15 mg/dL (ref 8–23)
CO2: 26 mmol/L (ref 22–32)
Calcium: 8.4 mg/dL — ABNORMAL LOW (ref 8.9–10.3)
Chloride: 101 mmol/L (ref 98–111)
Creatinine, Ser: 0.72 mg/dL (ref 0.44–1.00)
GFR, Estimated: >60 mL/min (ref >60)
Glucose, Bld: 184 mg/dL — ABNORMAL HIGH (ref 70–99)
Potassium: 3.4 mmol/L — ABNORMAL LOW (ref 3.5–5.1)
Sodium: 137 mmol/L (ref 135–145)
Total Bilirubin: 0.2 mg/dL — ABNORMAL LOW (ref 0.3–1.2)
Total Protein: 6.8 g/dL (ref 6.5–8.1)

## 2020-12-02 LAB — CBC WITH DIFFERENTIAL/PLATELET
Abs Immature Granulocytes: 0.02 10*3/uL (ref 0.00–0.07)
Basophils Absolute: 0.1 10*3/uL (ref 0.0–0.1)
Basophils Relative: 1 %
Eosinophils Absolute: 0.2 10*3/uL (ref 0.0–0.5)
Eosinophils Relative: 3 %
HCT: 29 % — ABNORMAL LOW (ref 36.0–46.0)
Hemoglobin: 9.1 g/dL — ABNORMAL LOW (ref 12.0–15.0)
Immature Granulocytes: 0 %
Lymphocytes Relative: 24 %
Lymphs Abs: 1.4 10*3/uL (ref 0.7–4.0)
MCH: 27.7 pg (ref 26.0–34.0)
MCHC: 31.4 g/dL (ref 30.0–36.0)
MCV: 88.4 fL (ref 80.0–100.0)
Monocytes Absolute: 0.9 10*3/uL (ref 0.1–1.0)
Monocytes Relative: 15 %
Neutro Abs: 3.4 10*3/uL (ref 1.7–7.7)
Neutrophils Relative %: 57 %
Platelets: 256 10*3/uL (ref 150–400)
RBC: 3.28 MIL/uL — ABNORMAL LOW (ref 3.87–5.11)
RDW: 15.9 % — ABNORMAL HIGH (ref 11.5–15.5)
WBC: 5.9 10*3/uL (ref 4.0–10.5)
nRBC: 0 % (ref 0.0–0.2)

## 2020-12-02 MED ORDER — SODIUM CHLORIDE 0.9 % IV SOLN
10.0000 mg | Freq: Once | INTRAVENOUS | Status: AC
  Administered 2020-12-02: 10 mg via INTRAVENOUS
  Filled 2020-12-02: qty 10

## 2020-12-02 MED ORDER — HYDROMORPHONE HCL 2 MG PO TABS: 2.0000 mg | ORAL_TABLET | ORAL | 0 refills | Status: DC | PRN

## 2020-12-02 MED ORDER — DOXORUBICIN HCL LIPOSOMAL CHEMO INJECTION 2 MG/ML
50.0000 mg/m2 | Freq: Once | INTRAVENOUS | Status: AC
  Administered 2020-12-02: 120 mg via INTRAVENOUS
  Filled 2020-12-02: qty 50

## 2020-12-02 MED ORDER — FULVESTRANT 250 MG/5ML IM SOLN
INTRAMUSCULAR | Status: AC
  Filled 2020-12-02: qty 5

## 2020-12-02 MED ORDER — DEXTROSE 5 % IV SOLN: Freq: Once | INTRAVENOUS | Status: AC

## 2020-12-02 MED ORDER — HEPARIN SOD (PORK) LOCK FLUSH 100 UNIT/ML IV SOLN
500.0000 [IU] | Freq: Once | INTRAVENOUS | Status: AC | PRN
  Administered 2020-12-02: 500 [IU]

## 2020-12-02 MED ORDER — PALONOSETRON HCL INJECTION 0.25 MG/5ML
0.2500 mg | Freq: Once | INTRAVENOUS | Status: AC
  Administered 2020-12-02: 0.25 mg via INTRAVENOUS
  Filled 2020-12-02: qty 5

## 2020-12-02 MED ORDER — SODIUM CHLORIDE 0.9% FLUSH
10.0000 mL | INTRAVENOUS | Status: DC | PRN
  Administered 2020-12-02: 10 mL

## 2020-12-02 NOTE — Progress Notes (Signed)
Chaplain engaged in an initial visit with Crystal Vega.  Chaplain spent time learning about Crystal Vega's upbringing and life. Crystal Vega was born and raised in [place].  She lost her mom at age 70 to what she now recognizes as colon cancer.  She detailed only knowing that something was going on with her mom's stomach when she was younger.  Crystal Vega described her mom as being very loving and community oriented.  Her mother would often buy ice cream for all the kids in the neighborhood every Saturday.  Crystal Vega learned about the power of being kind to people no matter who they were or where they were from, from her mom.  When her mom passed, she was raised by her aunt who she detailed was quite different than her mom.  As an only child, Crystal Vega credits God to being with her during that time in her life in which she experienced a lot of changes, grief and transitions.   Crystal Vega has a supportive community in her husband, two daughters, and two grandsons.  She has been married to her husband for over 30 years.  She expressed that they have built their foundation on friendship and communication.  Crystal Vega voiced that she has also raised her daughters to communicate well with each other and really love on each other.  She stated that they are close to this day because she made sure that they talked out their differences and took care of each other.  Her daughters communicate or visit her daily and her husband has done a great job of taking on more duties and tasks such as cooking.  Chaplain celebrated and affirmed the family that Crystal Vega has and her role as a mom and wife.    Chaplain and Crystal Vega also spent time talking about Crystal Vega's diagnosis.  She vocalized knowing that something was wrong but that she felt too afraid to go to the doctor.  She came to a place eventually in which she had to tell her husband about the pain she was experiencing.  Crystal Vega voiced now that she takes it "one day at a time."  She recognizes that  death is apart of life having lost her own mom and dad.  Crystal Vega expressed that sometimes those thoughts get her down but that she has talks with her husband and daughters about her current reality. Chaplain affirmed the ways that Crystal Vega has utilized her experiences with grief and loss to have important conversations with her family.   Chaplain offered listening, reflection and presence with Crystal Vega.  She was grateful for chaplain's visit and to have someone to talk too.  Chaplain will follow-up on her next visit.    12/02/20 1200  Clinical Encounter Type  Visited With Patient  Visit Type Initial

## 2020-12-02 NOTE — Progress Notes (Signed)
Received ok to give Doxil at 50 mg/m2 today.  Patient lab and clinical presentation are appropriate for dose escalation.  T.O. Dr Genia Hotter, PharmD

## 2020-12-02 NOTE — Progress Notes (Signed)
Patient presented today for cycle three. Only complaint is pain in her left arm, axilla area. Patient states she has been taking vicodin but having to take more than she would normally take. Pt. Stated she was on dilaudid and ran out, she states that it worked better for her.   Labs reviewed today with Dr. Lorenso Courier. Will proceed with treatment per MD. Dilaudid refilled per MD.

## 2020-12-02 NOTE — Progress Notes (Signed)
Patients port flushed without difficulty.  Good blood return noted with no bruising or swelling noted at site.  Stable during access and blood draw.  Patient to remain accessed for treatment. 

## 2020-12-02 NOTE — Progress Notes (Signed)
Patient presents today for treatment per orders.  Patient tolerated treatment well with no complaints voiced.  Patient left ambulatory in stable condition.  Vital signs stable at discharge.  Follow up as scheduled.    

## 2020-12-02 NOTE — Patient Instructions (Signed)
[facility]  Discharge Instructions: Thank you for choosing [facility] to provide your oncology and hematology care.  If you have a lab appointment with the Cancer Center, please come in thru the Main Entrance and check in at the main information desk.  Wear comfortable clothing and clothing appropriate for easy access to any Portacath or PICC line.   We strive to give you quality time with your provider. You may need to reschedule your appointment if you arrive late (15 or more minutes).  Arriving late affects you and other patients whose appointments are after yours.  Also, if you miss three or more appointments without notifying the office, you may be dismissed from the clinic at the provider's discretion.      For prescription refill requests, have your pharmacy contact our office and allow 72 hours for refills to be completed.        To help prevent nausea and vomiting after your treatment, we encourage you to take your nausea medication as directed.  BELOW ARE SYMPTOMS THAT SHOULD BE REPORTED IMMEDIATELY: *FEVER GREATER THAN 100.4 F (38 C) OR HIGHER *CHILLS OR SWEATING *NAUSEA AND VOMITING THAT IS NOT CONTROLLED WITH YOUR NAUSEA MEDICATION *UNUSUAL SHORTNESS OF BREATH *UNUSUAL BRUISING OR BLEEDING *URINARY PROBLEMS (pain or burning when urinating, or frequent urination) *BOWEL PROBLEMS (unusual diarrhea, constipation, pain near the anus) TENDERNESS IN MOUTH AND THROAT WITH OR WITHOUT PRESENCE OF ULCERS (sore throat, sores in mouth, or a toothache) UNUSUAL RASH, SWELLING OR PAIN  UNUSUAL VAGINAL DISCHARGE OR ITCHING   Items with * indicate a potential emergency and should be followed up as soon as possible or go to the Emergency Department if any problems should occur.  Please show the CHEMOTHERAPY ALERT CARD or IMMUNOTHERAPY ALERT CARD at check-in to the Emergency Department and triage nurse.  Should you have questions after your visit or need to cancel  or reschedule your appointment, please contact [facility] [phone]  and follow the prompts.  Office hours are 8:00 a.m. to 4:30 p.m. Monday - Friday. Please note that voicemails left after 4:00 p.m. may not be returned until the following business day.  We are closed weekends and major holidays. You have access to a nurse at all times for urgent questions. Please call the main number to the clinic [phone] and follow the prompts.  For any non-urgent questions, you may also contact your provider using MyChart. We now offer e-Visits for anyone 18 and older to request care online for non-urgent symptoms. For details visit [email or web address].   Also download the MyChart app! Go to the app store, search "MyChart", open the app, select [facility], and log in with your MyChart username and password.  Due to Covid, a mask is required upon entering the hospital/clinic. If you do not have a mask, one will be given to you upon arrival. For doctor visits, patients may have 1 support person aged 18 or older with them. For treatment visits, patients cannot have anyone with them due to current Covid guidelines and our immunocompromised population.  

## 2020-12-04 ENCOUNTER — Encounter (HOSPITAL_COMMUNITY): Payer: Self-pay | Admitting: Internal Medicine

## 2020-12-17 ENCOUNTER — Other Ambulatory Visit (HOSPITAL_COMMUNITY): Payer: Self-pay | Admitting: *Deleted

## 2020-12-17 MED ORDER — HYDROMORPHONE HCL 2 MG PO TABS: 2.0000 mg | ORAL_TABLET | ORAL | 0 refills | Status: DC | PRN

## 2020-12-19 ENCOUNTER — Other Ambulatory Visit (HOSPITAL_COMMUNITY): Payer: Self-pay

## 2020-12-25 ENCOUNTER — Ambulatory Visit (HOSPITAL_COMMUNITY): Payer: Medicare HMO | Admitting: Hematology

## 2020-12-25 ENCOUNTER — Other Ambulatory Visit (HOSPITAL_COMMUNITY): Payer: Medicare HMO

## 2020-12-25 ENCOUNTER — Ambulatory Visit (HOSPITAL_COMMUNITY): Payer: Medicare HMO

## 2020-12-29 NOTE — Progress Notes (Signed)
Crystal Vega, [place] [address]   CLINIC:  Medical Oncology/Hematology  PCP:  Lemmie Evens, MD [place]. / [place] [address] [phone]   REASON FOR VISIT:  Follow-up for left breast cancer  PRIOR THERAPY:  1. Paclitaxel x 6 cycles from 12/31/2019 to 06/02/2020. 2. Xeloda through 09/17/2020.  NGS Results: ER/HER-2 positive, PR negative, Ki-67 30%  CURRENT THERAPY:  Doxorubicin & Aloxi every 4 weeks  BRIEF ONCOLOGIC HISTORY:  Oncology History  Malignant neoplasm of overlapping sites of left breast in female, estrogen receptor positive ([place])  11/16/2019 Cancer Staging   Staging form: Breast, AJCC 8th Edition - Clinical stage from 11/16/2019: Stage IV (cT4b, cN1, cM1, G3, ER+, PR-, HER2-) - Signed by [name], MD on 12/17/2019    11/28/2019 Initial Diagnosis   Patient noted intermittent left breast pain and swelling for several months. Mammogram showed a 6.2cm mass in the left breast axillary tail with an adjacent 0.9cm mass, 2 abnormal left axillary lymph nodes, 1 abnormal right axillary lymph node, and diffuse left breast edema and skin thickening. Biopsy showed IDC, grade 3 in the left breast and bilateral axillas, HER-2 negative (1+), ER+ 100%, PR- 0%, Ki67 30%.    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 female, returns for routine follow-up and consideration for next cycle of chemotherapy. Crystal Vega was last seen on 10/02/20.  Due for cycle #4 of Doxorubicin today.   Overall, she tells me she has been feeling pretty well. She reports nausea and vomiting up to 3 times daily for the 4 weeks following her last treatment; Compazine has helped the nausea, but she admits she often forgets to take it. She also reports intermittent burning and throbbing pain in her left arm which has limited her ROM; she takes around 2-3 dilaudid pills daily which slightly helps. She also reports numbness in her toes and hematuria. She also reports fatigue, but it has not worsened since her last treatment. She has not been able to all of her daily home activities, and often has to have assistance from her husband. She denies mouth sores, skin peeling or blistering, and bloody stool.   Overall, she feels ready for next cycle of chemo today.   REVIEW OF SYSTEMS:  Review of Systems  Constitutional:  Positive for appetite change (50%) and fatigue (depleted).  HENT:  Negative for mouth sores.   Respiratory:  Positive for cough.   Gastrointestinal:  Positive for constipation, nausea and vomiting. Negative for blood in stool.  Genitourinary:  Positive for hematuria.   Musculoskeletal:  Positive for myalgias (L arn).  Neurological:  Positive for numbness (toes).  Psychiatric/Behavioral:  Positive for depression and sleep disturbance.   All other systems reviewed and are  negative.  PAST MEDICAL/SURGICAL HISTORY:  Past Medical History:  Diagnosis Date   Anemia    Arthritis    per patient " left knee"   DVT (deep venous thrombosis) (HCC)    left leg   Dyspnea    Essential hypertension, benign    Family history of cancer of female genital organ    Family history of GI tract cancer    Metastatic breast cancer ([place])    left breast   Port-A-Cath in place 12/25/2019   Type 2 diabetes mellitus [facility])    Past Surgical History:  Procedure Laterality Date   BIOPSY  11/27/2020   Procedure: BIOPSY;  Surgeon: Daneil Dolin, MD;  Location[facility];  Service: Endoscopy;;   CATARACT EXTRACTION W/ INTRAOCULAR LENS IMPLANT Right    COLONOSCOPY WITH PROPOFOL N/A 11/27/2020   Procedure: COLONOSCOPY WITH PROPOFOL;  Surgeon: Daneil Dolin, MD;  Location: [facility];  Service: Endoscopy;  Laterality: N/A;  9:00am   HYSTEROSCOPY WITH D & C N/A 06/05/2014   Procedure: DILATATION AND CURETTAGE /HYSTEROSCOPY;  Surgeon: Florian Buff, MD;  Location: [facility] ORS;  Service: Gynecology;  Laterality: N/A;   POLYPECTOMY N/A 06/05/2014   Procedure: ENDOMETRIAL POLYPECTOMY;  Surgeon: Florian Buff, MD;  Location: [facility] ORS;  Service: Gynecology;  Laterality: N/A;   PORTACATH PLACEMENT N/A 12/14/2019   Procedure: INSERTION PORT-A-CATH WITH ULTRASOUND GUIDANCE;  Surgeon: Donnie Mesa, MD;  Location: [facility];  Service: General;  Laterality: N/A;   TUBAL LIGATION      SOCIAL HISTORY:  Social History   Socioeconomic History   Marital status: Married    Spouse name: Herbie Baltimore   Number of children: 2   Years of education: Not on file   Highest education level: Not on file  Occupational History   Occupation: retired  Tobacco Use   Smoking status: Former    Types: Cigarettes   Smokeless tobacco: Never   Tobacco comments:    quit about 40+ years ago  Vaping Use   Vaping Use: Never used  Substance and Sexual Activity   Alcohol use: Never   Drug use: No   Sexual activity: Not on  file  Other Topics Concern   Not on file  Social History Narrative   Not on file   Social Determinants of Health   Financial Resource Strain: Not on file  Food Insecurity: Not on file  Transportation Needs: Not on file  Physical Activity: Not on file  Stress: Not on file  Social Connections: Not on file  Intimate Partner Violence: Not on file    FAMILY HISTORY:  Family History  Problem Relation Age of Onset   Diabetes Mellitus II Father    Congestive Heart Failure Father    Colon cancer Mother 2       patient not sure if colon vs stomach   Stroke Maternal Grandmother    Cancer Cousin        female reproductive cancer, dx. 50s/60s    CURRENT MEDICATIONS:  Current Outpatient Medications  Medication Sig Dispense Refill   apixaban (ELIQUIS) 5 MG TABS tablet  Take 1 tablet (5 mg total) by mouth 2 (two) times daily. 60 tablet 3   chlorpheniramine-HYDROcodone (TUSSIONEX) 10-8 MG/5ML SUER SMARTSIG:1 Teaspoon By Mouth Twice Daily (Patient taking differently: Take 5 mLs by mouth daily as needed for cough.) 480 mL 0   ferrous sulfate 325 (65 FE) MG tablet Take 325 mg by mouth daily with breakfast.     hydrochlorothiazide (HYDRODIURIL) 25 MG tablet Take 25 mg by mouth daily.     HYDROcodone-acetaminophen (NORCO/VICODIN) 5-325 MG tablet Take 1 tablet by mouth every 6 (six) hours as needed for moderate pain. 15 tablet 0   HYDROmorphone (DILAUDID) 2 MG tablet Take 1 tablet (2 mg total) by mouth every 4 (four) hours as needed for severe pain. 60 tablet 0   Ketotifen Fumarate (ITCHY EYE DROPS OP) Place 1 drop into both eyes daily as needed (Itching eyes).     losartan (COZAAR) 50 MG tablet Take 50 mg by mouth daily.     metFORMIN (GLUCOPHAGE) 500 MG tablet Take 500 mg by mouth 2 (two) times daily with a meal.     OVER THE COUNTER MEDICATION Apply 1 application topically daily as needed (Knee pain). Absorbine Jr. Roll on     potassium chloride (MICRO-K) 10 MEQ CR capsule Take 10 mEq by mouth  daily.      pravastatin (PRAVACHOL) 10 MG tablet Take 10 mg by mouth daily.      prochlorperazine (COMPAZINE) 10 MG tablet Take 1 tablet (10 mg total) by mouth every 6 (six) hours as needed (Nausea or vomiting). 30 tablet 1   No current facility-administered medications for this visit.    ALLERGIES:  No Known Allergies  PHYSICAL EXAM:  Performance status (ECOG): 1 - Symptomatic but completely ambulatory  There were no vitals filed for this visit. Wt Readings from Last 3 Encounters:  12/02/20 274 lb 12.8 oz (124.6 kg)  11/25/20 276 lb (125.2 kg)  11/12/20 275 lb 12.8 oz (125.1 kg)   Physical Exam Vitals reviewed.  Constitutional:      Appearance: Normal appearance.  Cardiovascular:     Rate and Rhythm: Normal rate and regular rhythm.     Pulses: Normal pulses.     Heart sounds: Normal heart sounds.  Pulmonary:     Effort: Pulmonary effort is normal.     Breath sounds: Normal breath sounds.  Chest:  Breasts:    Right: No inverted nipple, mass or nipple discharge.     Left: Mass (indurated mass involving whole breast) present. No inverted nipple or nipple discharge.  Musculoskeletal:     Right lower leg: No edema.     Left lower leg: No edema.  Neurological:     General: No focal deficit present.     Mental Status: She is alert and oriented to person, place, and time.  Psychiatric:        Mood and Affect: Mood normal.        Behavior: Behavior normal.    LABORATORY DATA:  I have reviewed the labs as listed.  CBC Latest Ref Rng & Units 12/02/2020 10/30/2020 10/02/2020  WBC 4.0 - 10.5 K/uL 5.9 7.4 6.1  Hemoglobin 12.0 - 15.0 g/dL 9.1(L) 8.9(L) 9.2(L)  Hematocrit 36.0 - 46.0 % 29.0(L) 28.6(L) 28.5(L)  Platelets 150 - 400 K/uL 256 368 220   CMP Latest Ref Rng & Units 12/02/2020 10/30/2020 10/02/2020  Glucose 70 - 99 mg/dL 184(H) 133(H) 144(H)  BUN 8 - 23 mg/dL 15 18 18   Creatinine 0.44 - 1.00 mg/dL  0.72 0.72 0.76  Sodium 135 - 145 mmol/L 137 134(L) 136  Potassium 3.5 - 5.1  mmol/L 3.4(L) 3.4(L) 3.3(L)  Chloride 98 - 111 mmol/L 101 100 102  CO2 22 - 32 mmol/L 26 26 26   Calcium 8.9 - 10.3 mg/dL 8.4(L) 8.7(L) 8.8(L)  Total Protein 6.5 - 8.1 g/dL 6.8 7.3 7.1  Total Bilirubin 0.3 - 1.2 mg/dL 0.2(L) 0.4 0.7  Alkaline Phos 38 - 126 U/L 88 78 78  AST 15 - 41 U/L 32 32 33  ALT 0 - 44 U/L 13 16 19     DIAGNOSTIC IMAGING:  I have independently reviewed the scans and discussed with the patient. No results found.   ASSESSMENT:  1.  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  Right breast upper outer quadrant biopsy was also positive for malignancy. -MRI of the breast on 12/10/2019 shows left breast malignancy involving all 4 quadrants and skin of the left breast including 0.5 cm biopsy-proven malignancy within the far posterior outer right breast, abnormal highly suspicious nonmuscle like enhancement within all 4 quadrants of the left breast and diffuse skin thickening.  This abnormal non-mass-like enhancement and skin thickening extends across the midline to the  medial right breast.  At least 7 abnormal left axillary lymph nodes and 1 abnormal right axillary lymph node compatible with metastatic disease. -CT CAP on 12/12/2019 shows bulky lymphadenopathy in the left subpectoral and axillary regions with largest lymph node measuring 3.3 cm.  Mild mediastinal adenopathy in the right paratracheal prevascular and lateral aortic regions, largest lymph node in the lateral aortic region measuring 2.4 cm.  Lymphadenopathy in the inferior jugular and supraclavicular regions, left side greater than right.  Mild lymphadenopathy in the right axilla. -Multiple small hypervascular lesions seen in the right and left lobes consistent with liver metastasis. -Bone scan on 12/12/2019 did not show any bone metastasis. -CT CAP on 12/29/2019 showed possible lytic lesion  in the T4 vertebral body. -Liver biopsy on 12/28/2019 consistent with metastatic breast cancer. -Paclitaxel weekly 3 weeks on 1 week off started on 12/31/2019. -CT CAP on 03/20/2020 shows mild decrease in the left axillary, subpectoral, supraclavicular, mediastinal and lower jugular lymphadenopathy.  Stable small low-attenuation liver lesion.  No new progressive metastatic disease.  Stable lesion in the left sternal manubrium and T4 vertebral body.  Stable short segment concentric wall thickening of the right colon with subcentimeter pericolonic lymph nodes.  We will monitor this finding as she has metastatic breast cancer.  She is asymptomatic. -CT CAP on 06/18/1998 6:22 cycles of chemotherapy showed no new or progressive metastatic disease. Similar size of the index and nonindex left axillary subpectoral, supraclavicular and lower jugular lymphadenopathy. - 3 cycles of Doxil from 10/02/2020 through 12/02/2020.   2.  Right colonic mass: -CT scan showed constricting mass involving the ascending colon measuring 3.6 x 3.5 cm with a small 1 cm right pericolonic lymph node. -She never had a colonoscopy.  Denies any bleeding per rectum or melena.   3.  Left leg DVT: -Doppler on 03/03/2020 shows left femoral vein, popliteal vein and posterior tibial vein deep vein thrombosis.   PLAN:  1.  Metastatic left breast cancer to the liver: - She has received cycle 3 of Doxil at 50 mg/m2 on 12/02/2020. - She reported nausea and vomiting more severe after last treatment. - She also reported increased fatigue after last  treatment. - We have reviewed her labs today.  LFTs and creatinine were normal.  CBC shows normocytic anemia likely from marrow suppression. - I would hold off on her treatment today as she is feeling weak.  I have recommended doing a CT scan of the chest, abdomen and pelvis with contrast to evaluate response.  We will also check CEA and CA 15-3 levels.   2.  Right colonic adenocarcinoma: - We have  reviewed biopsy results of the right colon mass which was consistent with adenocarcinoma. - We are currently holding off surgical management because of her advanced breast cancer.   3. Peripheral neuropathy: - Neuropathy in the feet is stable at this time.   4.  Left leg DVT: - She is continuing Eliquis twice daily.   5.  Postmenopausal bleeding: - She continues to have blood in the urine every time she urinates. - Ultrasound of the pelvis from 09/23/2020 showed thickened endometrial stripe measuring up to 19.3 mm. - We have made a referral to GYN.  She has not seen them yet.  6.  Left arm pain: - She reports burning to throbbing pain of the left arm. - She is taking Dilaudid 2 mg 2-3 times daily. - I have recommended her to take Dilaudid 2 mg in the morning along with Compazine.   Orders placed this encounter:  No orders of the defined types were placed in this encounter.    [name], MD [place] [phone]   I, Thana Ates, am acting as a scribe for Dr. [name].  I, [name] MD, have reviewed the above documentation for accuracy and completeness, and I agree with the above.

## 2020-12-30 ENCOUNTER — Inpatient Hospital Stay (HOSPITAL_COMMUNITY): Payer: Medicare HMO | Admitting: Hematology

## 2020-12-30 ENCOUNTER — Inpatient Hospital Stay (HOSPITAL_COMMUNITY): Payer: Medicare HMO

## 2020-12-30 ENCOUNTER — Other Ambulatory Visit: Payer: Self-pay

## 2020-12-30 ENCOUNTER — Inpatient Hospital Stay (HOSPITAL_COMMUNITY): Payer: Medicare HMO | Attending: Hematology

## 2020-12-30 VITALS — BP 120/84 | HR 114 | Temp 96.7°F | Resp 20 | Wt 268.2 lb

## 2020-12-30 DIAGNOSIS — E119 Type 2 diabetes mellitus without complications: Secondary | ICD-10-CM | POA: Diagnosis not present

## 2020-12-30 DIAGNOSIS — I10 Essential (primary) hypertension: Secondary | ICD-10-CM | POA: Insufficient documentation

## 2020-12-30 DIAGNOSIS — Z86718 Personal history of other venous thrombosis and embolism: Secondary | ICD-10-CM | POA: Diagnosis not present

## 2020-12-30 DIAGNOSIS — G893 Neoplasm related pain (acute) (chronic): Secondary | ICD-10-CM | POA: Diagnosis not present

## 2020-12-30 DIAGNOSIS — Z170 Estrogen receptor positive status [ER+]: Secondary | ICD-10-CM

## 2020-12-30 DIAGNOSIS — C50611 Malignant neoplasm of axillary tail of right female breast: Secondary | ICD-10-CM | POA: Insufficient documentation

## 2020-12-30 DIAGNOSIS — G620 Drug-induced polyneuropathy: Secondary | ICD-10-CM | POA: Insufficient documentation

## 2020-12-30 DIAGNOSIS — Z7901 Long term (current) use of anticoagulants: Secondary | ICD-10-CM | POA: Insufficient documentation

## 2020-12-30 DIAGNOSIS — R21 Rash and other nonspecific skin eruption: Secondary | ICD-10-CM | POA: Diagnosis not present

## 2020-12-30 DIAGNOSIS — R0602 Shortness of breath: Secondary | ICD-10-CM | POA: Insufficient documentation

## 2020-12-30 DIAGNOSIS — Z87891 Personal history of nicotine dependence: Secondary | ICD-10-CM | POA: Insufficient documentation

## 2020-12-30 DIAGNOSIS — G479 Sleep disorder, unspecified: Secondary | ICD-10-CM | POA: Insufficient documentation

## 2020-12-30 DIAGNOSIS — Z5111 Encounter for antineoplastic chemotherapy: Secondary | ICD-10-CM | POA: Diagnosis present

## 2020-12-30 DIAGNOSIS — Z79891 Long term (current) use of opiate analgesic: Secondary | ICD-10-CM | POA: Insufficient documentation

## 2020-12-30 DIAGNOSIS — Z79899 Other long term (current) drug therapy: Secondary | ICD-10-CM | POA: Diagnosis not present

## 2020-12-30 DIAGNOSIS — R112 Nausea with vomiting, unspecified: Secondary | ICD-10-CM | POA: Diagnosis not present

## 2020-12-30 DIAGNOSIS — K921 Melena: Secondary | ICD-10-CM | POA: Insufficient documentation

## 2020-12-30 DIAGNOSIS — R319 Hematuria, unspecified: Secondary | ICD-10-CM | POA: Insufficient documentation

## 2020-12-30 DIAGNOSIS — R5383 Other fatigue: Secondary | ICD-10-CM | POA: Diagnosis not present

## 2020-12-30 DIAGNOSIS — C9000 Multiple myeloma not having achieved remission: Secondary | ICD-10-CM

## 2020-12-30 DIAGNOSIS — C787 Secondary malignant neoplasm of liver and intrahepatic bile duct: Secondary | ICD-10-CM | POA: Insufficient documentation

## 2020-12-30 DIAGNOSIS — C182 Malignant neoplasm of ascending colon: Secondary | ICD-10-CM | POA: Insufficient documentation

## 2020-12-30 DIAGNOSIS — C50812 Malignant neoplasm of overlapping sites of left female breast: Secondary | ICD-10-CM

## 2020-12-30 DIAGNOSIS — C771 Secondary and unspecified malignant neoplasm of intrathoracic lymph nodes: Secondary | ICD-10-CM | POA: Diagnosis not present

## 2020-12-30 LAB — COMPREHENSIVE METABOLIC PANEL
ALT: 13 U/L (ref 0–44)
AST: 34 U/L (ref 15–41)
Albumin: 3.2 g/dL — ABNORMAL LOW (ref 3.5–5.0)
Alkaline Phosphatase: 84 U/L (ref 38–126)
Anion gap: 7 (ref 5–15)
BUN: 16 mg/dL (ref 8–23)
CO2: 26 mmol/L (ref 22–32)
Calcium: 8.4 mg/dL — ABNORMAL LOW (ref 8.9–10.3)
Chloride: 101 mmol/L (ref 98–111)
Creatinine, Ser: 0.78 mg/dL (ref 0.44–1.00)
GFR, Estimated: >60 mL/min (ref >60)
Glucose, Bld: 158 mg/dL — ABNORMAL HIGH (ref 70–99)
Potassium: 3.3 mmol/L — ABNORMAL LOW (ref 3.5–5.1)
Sodium: 134 mmol/L — ABNORMAL LOW (ref 135–145)
Total Bilirubin: 0.5 mg/dL (ref 0.3–1.2)
Total Protein: 7 g/dL (ref 6.5–8.1)

## 2020-12-30 LAB — CBC WITH DIFFERENTIAL/PLATELET
Abs Immature Granulocytes: 0.03 10*3/uL (ref 0.00–0.07)
Basophils Absolute: 0.1 10*3/uL (ref 0.0–0.1)
Basophils Relative: 1 %
Eosinophils Absolute: 0.1 10*3/uL (ref 0.0–0.5)
Eosinophils Relative: 1 %
HCT: 27 % — ABNORMAL LOW (ref 36.0–46.0)
Hemoglobin: 8.3 g/dL — ABNORMAL LOW (ref 12.0–15.0)
Immature Granulocytes: 1 %
Lymphocytes Relative: 26 %
Lymphs Abs: 1.4 10*3/uL (ref 0.7–4.0)
MCH: 26.5 pg (ref 26.0–34.0)
MCHC: 30.7 g/dL (ref 30.0–36.0)
MCV: 86.3 fL (ref 80.0–100.0)
Monocytes Absolute: 0.9 10*3/uL (ref 0.1–1.0)
Monocytes Relative: 17 %
Neutro Abs: 2.9 10*3/uL (ref 1.7–7.7)
Neutrophils Relative %: 54 %
Platelets: 329 10*3/uL (ref 150–400)
RBC: 3.13 MIL/uL — ABNORMAL LOW (ref 3.87–5.11)
RDW: 16.9 % — ABNORMAL HIGH (ref 11.5–15.5)
WBC: 5.4 10*3/uL (ref 4.0–10.5)
nRBC: 0 % (ref 0.0–0.2)

## 2020-12-30 MED ORDER — SODIUM CHLORIDE 0.9% FLUSH
10.0000 mL | Freq: Once | INTRAVENOUS | Status: AC
  Administered 2020-12-30: 10 mL via INTRAVENOUS

## 2020-12-30 MED ORDER — HYDROMORPHONE HCL 2 MG PO TABS: 2.0000 mg | ORAL_TABLET | ORAL | 0 refills | Status: DC | PRN

## 2020-12-30 MED ORDER — HEPARIN SOD (PORK) LOCK FLUSH 100 UNIT/ML IV SOLN
500.0000 [IU] | Freq: Once | INTRAVENOUS | Status: AC
  Administered 2020-12-30: 500 [IU] via INTRAVENOUS

## 2020-12-30 NOTE — Patient Instructions (Addendum)
[place] at [facility] Discharge Instructions  You were seen today by Dr. Delton Coombes. He went over your recent results. You will be scheduled for a CT scan of your chest, abdomen, and pelvis prior to your next appointment. Dr. Delton Coombes will see you back in after your CT scan for labs and follow up.   Thank you for choosing [place] at [facility] to provide your oncology and hematology care.  To afford each patient quality time with our provider, please arrive at least 15 minutes before your scheduled appointment time.   If you have a lab appointment with the [facility] please come in thru the Main Entrance and check in at the main information desk  You need to re-schedule your appointment should you arrive 10 or more minutes late.  We strive to give you quality time with our providers, and arriving late affects you and other patients whose appointments are after yours.  Also, if you no show three or more times for appointments you may be dismissed from the clinic at the providers discretion.     Again, thank you for choosing [facility].  Our hope is that these requests will decrease the amount of time that you wait before being seen by our physicians.       _____________________________________________________________  Should you have questions after your visit to [facility] please contact our office at [phone] between the hours of 8:00 a.m. and 4:30 p.m.  Voicemails left after 4:00 p.m. will not be returned until the following business day.  For prescription refill requests, have your pharmacy contact our office and allow 72 hours.    Cancer Center Support Programs:   > Cancer Support Group  2nd Tuesday of the month 1pm-2pm, Journey Room

## 2020-12-30 NOTE — Patient Instructions (Signed)
[place]  Discharge Instructions: Thank you for choosing [facility] to provide your oncology and hematology care.  If you have a lab appointment with the [facility], please come in thru the Main Entrance and check in at the main information desk.  Wear comfortable clothing and clothing appropriate for easy access to any Portacath or PICC line.   We strive to give you quality time with your provider. You may need to reschedule your appointment if you arrive late (15 or more minutes).  Arriving late affects you and other patients whose appointments are after yours.  Also, if you miss three or more appointments without notifying the office, you may be dismissed from the clinic at the provider's discretion.      For prescription refill requests, have your pharmacy contact our office and allow 72 hours for refills to be completed.    Today your port was flushed and labs were drawn, no treatment today per Dr. Delton Coombes, return as scheduled.    To help prevent nausea and vomiting after your treatment, we encourage you to take your nausea medication as directed.  BELOW ARE SYMPTOMS THAT SHOULD BE REPORTED IMMEDIATELY: *FEVER GREATER THAN 100.4 F (38 C) OR HIGHER *CHILLS OR SWEATING *NAUSEA AND VOMITING THAT IS NOT CONTROLLED WITH YOUR NAUSEA MEDICATION *UNUSUAL SHORTNESS OF BREATH *UNUSUAL BRUISING OR BLEEDING *URINARY PROBLEMS (pain or burning when urinating, or frequent urination) *BOWEL PROBLEMS (unusual diarrhea, constipation, pain near the anus) TENDERNESS IN MOUTH AND THROAT WITH OR WITHOUT PRESENCE OF ULCERS (sore throat, sores in mouth, or a toothache) UNUSUAL RASH, SWELLING OR PAIN  UNUSUAL VAGINAL DISCHARGE OR ITCHING   Items with * indicate a potential emergency and should be followed up as soon as possible or go to the Emergency Department if any problems should occur.  Please show the CHEMOTHERAPY ALERT CARD or IMMUNOTHERAPY ALERT CARD at check-in  to the Emergency Department and triage nurse.  Should you have questions after your visit or need to cancel or reschedule your appointment, please contact [facility] [phone]  and follow the prompts.  Office hours are 8:00 a.m. to 4:30 p.m. Monday - Friday. Please note that voicemails left after 4:00 p.m. may not be returned until the following business day.  We are closed weekends and major holidays. You have access to a nurse at all times for urgent questions. Please call the main number to the clinic [phone] and follow the prompts.  For any non-urgent questions, you may also contact your provider using MyChart. We now offer e-Visits for anyone 25 and older to request care online for non-urgent symptoms. For details visit [email or web address].   Also download the MyChart app! Go to the app store, search "MyChart", open the app, select [place], and log in with your MyChart username and password.  Due to Covid, a mask is required upon entering the hospital/clinic. If you do not have a mask, one will be given to you upon arrival. For doctor visits, patients may have 1 support person aged 61 or older with them. For treatment visits, patients cannot have anyone with them due to current Covid guidelines and our immunocompromised population.

## 2020-12-30 NOTE — Progress Notes (Signed)
No treatment today per Dr. Delton Coombes. Port flushed with good blood return noted. No bruising or swelling at site. Bandaid applied and patient discharged in satisfactory condition via wheelchair. VVS stable with no signs or symptoms of distressed noted.

## 2020-12-31 ENCOUNTER — Telehealth (HOSPITAL_COMMUNITY): Payer: Self-pay

## 2020-12-31 ENCOUNTER — Other Ambulatory Visit (HOSPITAL_COMMUNITY): Payer: Self-pay | Admitting: Hematology

## 2020-12-31 DIAGNOSIS — C50812 Malignant neoplasm of overlapping sites of left female breast: Secondary | ICD-10-CM

## 2020-12-31 LAB — CANCER ANTIGEN 15-3: CA 15-3: 60 U/mL — ABNORMAL HIGH (ref 0.0–25.0)

## 2020-12-31 LAB — CANCER ANTIGEN 27.29: CA 27.29: 63.8 U/mL — ABNORMAL HIGH (ref 0.0–38.6)

## 2020-12-31 MED ORDER — PROCHLORPERAZINE MALEATE 10 MG PO TABS: 10.0000 mg | ORAL_TABLET | Freq: Four times a day (QID) | ORAL | 3 refills | Status: DC | PRN

## 2020-12-31 NOTE — Telephone Encounter (Signed)
Patient called needing a refill on nausea medication.  Dr Raliegh Ip sent prescription of compazine to pharmacy for patient.  Notified patient by phone of prescription sent to pharmacy and patient aware. 12/31/20

## 2021-01-06 ENCOUNTER — Other Ambulatory Visit: Payer: Self-pay

## 2021-01-06 ENCOUNTER — Ambulatory Visit (HOSPITAL_COMMUNITY)
Admission: RE | Admit: 2021-01-06 | Discharge: 2021-01-06 | Disposition: A | Discharge status: Home / Self Care | Payer: Medicare HMO | Source: Ambulatory Visit | Attending: Hematology | Admitting: Hematology

## 2021-01-06 DIAGNOSIS — C50812 Malignant neoplasm of overlapping sites of left female breast: Secondary | ICD-10-CM | POA: Diagnosis present

## 2021-01-06 DIAGNOSIS — Z170 Estrogen receptor positive status [ER+]: Secondary | ICD-10-CM | POA: Diagnosis present

## 2021-01-06 MED ORDER — IOHEXOL 350 MG/ML SOLN
85.0000 mL | Freq: Once | INTRAVENOUS | Status: AC | PRN
  Administered 2021-01-06: 85 mL via INTRAVENOUS

## 2021-01-07 ENCOUNTER — Ambulatory Visit (HOSPITAL_COMMUNITY): Payer: Medicare HMO | Admitting: Hematology

## 2021-01-07 DIAGNOSIS — Z170 Estrogen receptor positive status [ER+]: Secondary | ICD-10-CM

## 2021-01-11 NOTE — Progress Notes (Signed)
[name], [place] [address]   CLINIC:  Medical Oncology/Hematology  PCP:  Lemmie Evens, MD [place]. / [name] [address] [phone]   REASON FOR VISIT:  Follow-up for left breast cancer  PRIOR THERAPY:  1. Paclitaxel x 6 cycles from 12/31/2019 to 06/02/2020. 2. Xeloda through 09/17/2020.  NGS Results: ER/HER-2 positive, PR negative, Ki-67 30%  CURRENT THERAPY: Doxorubicin & Aloxi every 4 weeks  BRIEF ONCOLOGIC HISTORY:  Oncology History  Malignant neoplasm of overlapping sites of left breast in female, estrogen receptor positive ([place])  11/16/2019 Cancer Staging   Staging form: Breast, AJCC 8th Edition - Clinical stage from 11/16/2019: Stage IV (cT4b, cN1, cM1, G3, ER+, PR-, HER2-) - Signed by [name], MD on 12/17/2019   11/28/2019 Initial Diagnosis   Patient noted intermittent left breast pain and swelling for several months. Mammogram showed a 6.2cm mass in the left breast axillary tail with an adjacent 0.9cm mass, 2 abnormal left axillary lymph nodes, 1 abnormal right axillary lymph node, and diffuse left breast edema and skin thickening. Biopsy showed IDC, grade 3 in the left breast and bilateral axillas, HER-2 negative (1+), ER+ 100%, PR- 0%, Ki67 30%.    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 female, returns for routine follow-up. Kamarii was last seen on 12/30/20.  Overall, she tells me she has been feeling pretty well. She is taking dilaudid 1-2 times daily which she reports has not helped with the severe constant pain she reports in her left arm. Laying down helps relieve some of this pain. She reports numbness in her left arm and toes. She also reports spots of irritation that are leaking on her left arm and chest. She reports occasional nausea after eating as well as occasional vomiting which is helped with compazine. She denies any fevers or chills.  REVIEW OF SYSTEMS:  Review of Systems  Constitutional:  Positive for fatigue (depleted). Negative for appetite change (75%), chills and fever.  Respiratory:  Positive for shortness of breath (w/ exertion).   Gastrointestinal:  Positive for nausea and vomiting.  Musculoskeletal:  Positive for myalgias (L arm 7/10).  Skin:  Positive for rash (L arm irritation).  Neurological:  Positive for numbness (tingling in toes; numbness in L arm).  Psychiatric/Behavioral:  Positive for sleep disturbance (falling and staying).   All other systems reviewed  and are negative.  PAST MEDICAL/SURGICAL HISTORY:  Past Medical History:  Diagnosis Date   Anemia    Arthritis    per patient " left knee"   DVT (deep venous thrombosis) (HCC)    left leg   Dyspnea    Essential hypertension, benign    Family history of cancer of female genital organ    Family history of GI tract cancer    Metastatic breast  cancer ([place])    left breast   Port-A-Cath in place 12/25/2019   Type 2 diabetes mellitus [facility])    Past Surgical History:  Procedure Laterality Date   BIOPSY  11/27/2020   Procedure: BIOPSY;  Surgeon: Daneil Dolin, MD;  Location: [facility];  Service: Endoscopy;;   CATARACT EXTRACTION W/ INTRAOCULAR LENS IMPLANT Right    COLONOSCOPY WITH PROPOFOL N/A 11/27/2020   Procedure: COLONOSCOPY WITH PROPOFOL;  Surgeon: Daneil Dolin, MD;  Location: [facility] END[facility];  Service: Endoscopy;  Laterality: N/A;  9:00am   HYSTEROSCOPY WITH D & C N/A 06/05/2014   Procedure: DILATATION AND CURETTAGE /HYSTEROSCOPY;  Surgeon: Florian Buff, MD;  Location: [facility] ORS;  Service: Gynecology;  Laterality: N/A;   POLYPECTOMY N/A 06/05/2014   Procedure: ENDOMETRIAL POLYPECTOMY;  Surgeon: Florian Buff, MD;  Location: [facility] ORS;  Service: Gynecology;  Laterality: N/A;   PORTACATH PLACEMENT N/A 12/14/2019   Procedure: INSERTION PORT-A-CATH WITH ULTRASOUND GUIDANCE;  Surgeon: Donnie Mesa, MD;  Location: [facility];  Service: General;  Laterality: N/A;   TUBAL LIGATION      SOCIAL HISTORY:  Social History   Socioeconomic History   Marital status: Married    Spouse name: Herbie Baltimore   Number of children: 2   Years of education: Not on file   Highest education level: Not on file  Occupational History   Occupation: retired  Tobacco Use   Smoking status: Former    Types: Cigarettes   Smokeless tobacco: Never   Tobacco comments:    quit about 40+ years ago  Vaping Use   Vaping Use: Never used  Substance and Sexual Activity   Alcohol use: Never   Drug use: No   Sexual activity: Not on file  Other Topics Concern   Not on file  Social History Narrative   Not on file   Social Determinants of Health   Financial Resource Strain: Not on file  Food Insecurity: Not on file  Transportation Needs: Not on file  Physical Activity: Not on file  Stress: Not on file  Social Connections: Not on file  Intimate Partner Violence:  Not on file    FAMILY HISTORY:  Family History  Problem Relation Age of Onset   Diabetes Mellitus II Father    Congestive Heart Failure Father    Colon cancer Mother 19       patient not sure if colon vs stomach   Stroke Maternal Grandmother    Cancer Cousin        female reproductive cancer, dx. 50s/60s    CURRENT MEDICATIONS:  Current Outpatient Medications  Medication Sig Dispense Refill   apixaban (ELIQUIS) 5 MG TABS tablet Take 1 tablet (5 mg total) by mouth 2 (two) times daily. 60 tablet 3   chlorpheniramine-HYDROcodone (TUSSIONEX) 10-8 MG/5ML SUER SMARTSIG:1 Teaspoon By Mouth Twice Daily (Patient taking differently: Take 5 mLs by mouth daily as needed for cough.) 480 mL 0   ferrous sulfate 325 (65 FE) MG tablet Take 325 mg by mouth daily with breakfast.  hydrochlorothiazide (HYDRODIURIL) 25 MG tablet Take 25 mg by mouth daily.     HYDROcodone-acetaminophen (NORCO/VICODIN) 5-325 MG tablet Take 1 tablet by mouth every 6 (six) hours as needed for moderate pain. 15 tablet 0   HYDROmorphone (DILAUDID) 2 MG tablet Take 1 tablet (2 mg total) by mouth every 4 (four) hours as needed for severe pain. 90 tablet 0   Ketotifen Fumarate (ITCHY EYE DROPS OP) Place 1 drop into both eyes daily as needed (Itching eyes).     losartan (COZAAR) 50 MG tablet Take 50 mg by mouth daily.     metFORMIN (GLUCOPHAGE) 500 MG tablet Take 500 mg by mouth 2 (two) times daily with a meal.     OVER THE COUNTER MEDICATION Apply 1 application topically daily as needed (Knee pain). Absorbine Jr. Roll on     potassium chloride (MICRO-K) 10 MEQ CR capsule Take 10 mEq by mouth daily.      pravastatin (PRAVACHOL) 10 MG tablet Take 10 mg by mouth daily.      prochlorperazine (COMPAZINE) 10 MG tablet Take 1 tablet (10 mg total) by mouth every 6 (six) hours as needed (Nausea or vomiting). 60 tablet 3   No current facility-administered medications for this visit.    ALLERGIES:  No Known Allergies  PHYSICAL EXAM:   Performance status (ECOG): 1 - Symptomatic but completely ambulatory  There were no vitals filed for this visit. Wt Readings from Last 3 Encounters:  12/30/20 268 lb 3.2 oz (121.7 kg)  12/02/20 274 lb 12.8 oz (124.6 kg)  11/25/20 276 lb (125.2 kg)   Physical Exam Vitals reviewed.  Constitutional:      Appearance: Normal appearance.  Cardiovascular:     Rate and Rhythm: Normal rate and regular rhythm.     Pulses: Normal pulses.     Heart sounds: Normal heart sounds.  Pulmonary:     Effort: Pulmonary effort is normal.     Breath sounds: Normal breath sounds.  Lymphadenopathy:     Upper Body:     Right upper body: No supraclavicular adenopathy.     Left upper body: No supraclavicular adenopathy.  Neurological:     General: No focal deficit present.     Mental Status: She is alert and oriented to person, place, and time.  Psychiatric:        Mood and Affect: Mood normal.        Behavior: Behavior normal.    LABORATORY DATA:  I have reviewed the labs as listed.  CBC Latest Ref Rng & Units 12/30/2020 12/02/2020 10/30/2020  WBC 4.0 - 10.5 K/uL 5.4 5.9 7.4  Hemoglobin 12.0 - 15.0 g/dL 8.3(L) 9.1(L) 8.9(L)  Hematocrit 36.0 - 46.0 % 27.0(L) 29.0(L) 28.6(L)  Platelets 150 - 400 K/uL 329 256 368   CMP Latest Ref Rng & Units 12/30/2020 12/02/2020 10/30/2020  Glucose 70 - 99 mg/dL 158(H) 184(H) 133(H)  BUN 8 - 23 mg/dL _0 Creatinine 0.44 - 1.00 mg/dL 0.78 0.72 0.72  Sodium 135 - 145 mmol/L 134(L) 137 134(L)  Potassium 3.5 - 5.1 mmol/L 3.3(L) 3.4(L) 3.4(L)  Chloride 98 - 111 mmol/L 101 101 100  CO2 22 - 32 mmol/L _1 Calcium 8.9 - 10.3 mg/dL 8.4(L) 8.4(L) 8.7(L)  Total Protein 6.5 - 8.1 g/dL 7.0 6.8 7.3  Total Bilirubin 0.3 - 1.2 mg/dL 0.5 0.2(L) 0.4  Alkaline Phos 38 - 126 U/L 84 88 78  AST 15 - 41 U/L 34 32 32  ALT 0 -  44 U/L _0 DIAGNOSTIC IMAGING:  I have independently reviewed the scans and discussed with the patient. CT CHEST ABDOMEN PELVIS W  CONTRAST  Result Date: 01/07/2021 CLINICAL DATA:  Left breast cancer and colon cancer EXAM: CT CHEST, ABDOMEN, AND PELVIS WITH CONTRAST TECHNIQUE: Multidetector CT imaging of the chest, abdomen and pelvis was performed following the standard protocol during bolus administration of intravenous contrast. CONTRAST:  67m OMNIPAQUE IOHEXOL 350 MG/ML SOLN COMPARISON:  CT chest abdomen pelvis dated September 15, 2020 FINDINGS: CT CHEST FINDINGS Cardiovascular: Normal heart size. No pericardial effusion. Right chest wall port with tip in the right ventricle. Mediastinum/Nodes: Stable size of the large left supraclavicular and prevascular mediastinal lymph nodes. Stable enlarged cardiophrenic lymph nodes. Increased size of paraesophageal lymph node measuring 0.9 cm, previously 7 mm. Stable left axillary lymphadenopathy. Dominant left axillary mass located on series 2, image 22 is remeasured in similar plane, measures 3.6 x 3.7 cm, unchanged compared to prior exam. Increased size of right axillary lymph nodes. Reference right axillary lymph node located on series 2, image 34 measures 1.6 cm in short axis, previously 1.1 cm. Lungs/Pleura: New small solid left upper lobe pulmonary nodule measuring 3 mm located on series 3, image 62. No consolidation, pleural effusion or pneumothorax. CT ABDOMEN PELVIS FINDINGS Hepatobiliary: Previously seen liver lesions are not well evaluated due to streak artifact related to patient positioning. Gallbladder is unremarkable. No biliary ductal dilation. Pancreas: Unremarkable. No pancreatic ductal dilatation or surrounding inflammatory changes. Spleen: Normal in size without focal abnormality. Adrenals/Urinary Tract: Normal bilateral adrenal glands. Kidneys enhance symmetrically with no evidence of hydronephrosis. Bilateral renal cysts. Bladder is unremarkable. Stomach/Bowel: Stomach is within normal limits. Appendix appears normal. Circumferential masslike thickening of the ascending colon as  seen on coronal series 4 image 70 is slightly increased compared to prior. Vascular/Lymphatic: Interval increased size of enlarged pericaval lymph node located on series 2, image 58 measuring 2.1 cm in short axis, previously 1.2 cm. Increased size of mesenteric lymph nodes located adjacent to ascending colonic lesion. Reference node located on image 66 measures 7 mm, previously 5 mm. Unchanged mildly enlarged pelvic lymph nodes. Reference node measures 8 mm on series 2, image 92. Reproductive: Fibroid uterus. Previously seen endometrial thickening is not apparent on today's exam. Other: No abdominal wall hernia or abnormality. No abdominopelvic ascites. Musculoskeletal: Stable lytic lesions of the manubrium and T4 vertebral body. IMPRESSION: Mediastinal, supraclavicular and bilateral axillary lymphadenopathy with interval increased size of right axillary lymph nodes and a paraesophageal lymph node. Previously seen liver lesions are not well evaluated due to streak artifact related to patient positioning (arms down). Dedicated liver protocol CT could be performed for better evaluation if this would change clinical management. Interval increased size of enlarged pericaval abdominal lymph node. Circumferential focal wall thickening of the ascending colon is slightly increased compared to prior. Adjacent mesenteric lymph nodes are increased in size compared to prior exam. Unchanged osseous metastatic disease. Previously seen endometrial thickening is not apparent on today's exam. Right chest wall port with tip in the right ventricle. Electronically Signed   By: LYetta GlassmanMD   On: 01/07/2021 11:21     ASSESSMENT:  1.  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  Right breast upper outer quadrant biopsy was also positive for malignancy. -MRI of the breast on 12/10/2019 shows left  breast malignancy involving all 4 quadrants and skin of the left breast including 0.5 cm biopsy-proven malignancy within the far posterior outer right breast, abnormal highly suspicious nonmuscle like enhancement within all 4 quadrants of the left breast and diffuse skin thickening.  This abnormal non-mass-like enhancement and skin thickening extends across the midline to the  medial right breast.  At least 7 abnormal left axillary lymph nodes and 1 abnormal right axillary lymph node compatible with metastatic disease. -CT CAP on 12/12/2019 shows bulky lymphadenopathy in the left subpectoral and axillary regions with largest lymph node measuring 3.3 cm.  Mild mediastinal adenopathy in the right paratracheal prevascular and lateral aortic regions, largest lymph node in the lateral aortic region measuring 2.4 cm.  Lymphadenopathy in the inferior jugular and supraclavicular regions, left side greater than right.  Mild lymphadenopathy in the right axilla. -Multiple small hypervascular lesions seen in the right and left lobes consistent with liver metastasis. -Bone scan on 12/12/2019 did not show any bone metastasis. -CT CAP on 12/29/2019 showed possible lytic lesion in the T4 vertebral body. -Liver biopsy on 12/28/2019 consistent with metastatic breast cancer. -Paclitaxel weekly 3 weeks on 1 week off started on 12/31/2019. -CT CAP on 03/20/2020 shows mild decrease in the left axillary, subpectoral, supraclavicular, mediastinal and lower jugular lymphadenopathy.  Stable small low-attenuation liver lesion.  No new progressive metastatic disease.  Stable lesion in the left sternal manubrium and T4 vertebral body.  Stable short segment concentric wall thickening of the right colon with subcentimeter pericolonic lymph nodes.  We will monitor this finding as she has metastatic breast cancer.  She is asymptomatic. -CT CAP on 06/18/1998 6:22 cycles of chemotherapy showed no new or progressive metastatic disease. Similar size  of the index and nonindex left axillary subpectoral, supraclavicular and lower jugular lymphadenopathy. - 3 cycles of Doxil from 10/02/2020 through 12/02/2020.   2.  Right colonic mass: -CT scan showed constricting mass involving the ascending colon measuring 3.6 x 3.5 cm with a small 1 cm right pericolonic lymph node. -She never had a colonoscopy.  Denies any bleeding per rectum or melena.   3.  Left leg DVT: -Doppler on 03/03/2020 shows left femoral vein, popliteal vein and posterior tibial vein deep vein thrombosis.   PLAN:  1.  Metastatic left breast cancer to the liver: -she has completed 3 cycles of Doxil. - We discussed findings on the CT CAP from 01/06/2021.  Stable large left supraclavicular and prevascular mediastinal lymph nodes and cardiophrenic lymph nodes.  Increased to size of the paraesophageal lymph node.  Stable left axillary adenopathy.  Increased size of the right axillary lymph nodes by 5 mm.  New small solid left upper lobe lung nodule measuring 3 mm.  Previously seen liver lesions are not well evaluated due to streak artifact.  Interval increase size of pericaval lymph nodes and mesenteric lymph nodes.  Ascending colon mass also slightly increased from prior. - I have recommended change in treatment at this time. - She reported improvement in pain when she was initially treated with weekly paclitaxel.  We have discontinued paclitaxel as she was developing early neuropathy signs at that time.  She was then switched to Xeloda which she progressed on. - We also discussed other options including Enhertu in the HER2 low setting. - We will likely start treatment as soon as we get authorization from insurance.   2.  Right colonic adenocarcinoma: -CT scan on 01/06/2021 which showed slight worsening of the colon mass and adenopathy. - We  are currently holding off on surgical management because of her advanced breast cancer.   3. Peripheral neuropathy: -neuropathy in the feet is  stable.   4.  Left leg DVT: -Continue Eliquis twice daily.   5.  Postmenopausal bleeding: -she has some blood in the urine every time she urinates. - Ultrasound of the pelvis on 09/23/2020 showed thickened endometrial stripe measuring up to 19.3 mm. - We have made a referral to GYN.  6.  Left arm pain: -She continues to have throbbing pain in the left arm. - She is taking Dilaudid 2 mg 2-3 times daily. - She may increase Dilaudid to 2 mg every 4 hours as needed.  If that does not help, will increase Dilaudid to 4 mg every 4 hours as needed.   Orders placed this encounter:  No orders of the defined types were placed in this encounter.    [name], MD [place] [phone]   I, Thana Ates, am acting as a scribe for Dr. [name].  I, [name] MD, have reviewed the above documentation for accuracy and completeness, and I agree with the above.

## 2021-01-12 ENCOUNTER — Inpatient Hospital Stay (HOSPITAL_COMMUNITY): Payer: Medicare HMO | Admitting: Hematology

## 2021-01-12 ENCOUNTER — Other Ambulatory Visit: Payer: Self-pay

## 2021-01-12 DIAGNOSIS — C50812 Malignant neoplasm of overlapping sites of left female breast: Secondary | ICD-10-CM

## 2021-01-12 DIAGNOSIS — Z170 Estrogen receptor positive status [ER+]: Secondary | ICD-10-CM

## 2021-01-12 DIAGNOSIS — Z5111 Encounter for antineoplastic chemotherapy: Secondary | ICD-10-CM | POA: Diagnosis not present

## 2021-01-12 NOTE — Patient Instructions (Addendum)
[place] at [facility] Discharge Instructions  You were seen today by Dr. Delton Coombes. He went over your recent results and scans. Dr. Delton Coombes will see you back in at your next treatment for labs and follow up.   Thank you for choosing [place] at [facility] to provide your oncology and hematology care.  To afford each patient quality time with our provider, please arrive at least 15 minutes before your scheduled appointment time.   If you have a lab appointment with the [facility] please come in thru the Main Entrance and check in at the main information desk  You need to re-schedule your appointment should you arrive 10 or more minutes late.  We strive to give you quality time with our providers, and arriving late affects you and other patients whose appointments are after yours.  Also, if you no show three or more times for appointments you may be dismissed from the clinic at the providers discretion.     Again, thank you for choosing [facility].  Our hope is that these requests will decrease the amount of time that you wait before being seen by our physicians.       _____________________________________________________________  Should you have questions after your visit to [facility], please contact our office at [phone] between the hours of 8:00 a.m. and 4:30 p.m.  Voicemails left after 4:00 p.m. will not be returned until the following business day.  For prescription refill requests, have your pharmacy contact our office and allow 72 hours.    Cancer Center Support Programs:   > Cancer Support Group  2nd Tuesday of the month 1pm-2pm, Journey Room

## 2021-01-15 ENCOUNTER — Encounter (HOSPITAL_COMMUNITY): Payer: Self-pay | Admitting: Hematology

## 2021-01-15 NOTE — Progress Notes (Signed)
DISCONTINUE ON PATHWAY REGIMEN - Breast     A cycle is every 28 days:     Liposomal doxorubicin   **Always confirm dose/schedule in your pharmacy ordering system**  REASON: Disease Progression PRIOR TREATMENT: [id number]: Liposomal Doxorubicin 50 mg/m2 q28 Days TREATMENT RESPONSE: Progressive Disease (PD)    Patient Characteristics: Distant Metastases or Locoregional Recurrent Disease - Unresected or Locally Advanced Unresectable Disease Progressing after Neoadjuvant and Local Therapies, HER2 Negative/Unknown/Equivocal Therapeutic Status: Distant Metastases ER Status: Positive (+) HER2 Status: Negative (-) PR Status: Negative (-) Therapy Approach Indicated: Standard Chemotherapy/Endocrine Therapy

## 2021-01-15 NOTE — Progress Notes (Signed)
START ON PATHWAY REGIMEN - Breast     Administer weekly:     Carboplatin   **Always confirm dose/schedule in your pharmacy ordering system**  Patient Characteristics: Distant Metastases or Locoregional Recurrent Disease - Unresected or Locally Advanced Unresectable Disease Progressing after Neoadjuvant and Local Therapies, HER2 Negative/Unknown/Equivocal, ER Positive, Chemotherapy, Third Line and Beyond, Prior or  Contraindicated Anthracycline and Prior Eribulin Therapeutic Status: Distant Metastases ER Status: Positive (+) HER2 Status: Negative (-) PR Status: Negative (-) Therapy Approach Indicated: Standard Chemotherapy/Endocrine Therapy Line of Therapy: Third Line and Beyond Intent of Therapy: Non-Curative / Palliative Intent, Discussed with Patient

## 2021-01-15 NOTE — Progress Notes (Signed)
ON PATHWAY REGIMEN - Breast  No Change  Continue With Treatment as Ordered.  Original Decision Date/Time: 09/26/2020 20:24     A cycle is every 28 days:     Liposomal doxorubicin   **Always confirm dose/schedule in your pharmacy ordering system**  Patient Characteristics: Distant Metastases or Locoregional Recurrent Disease - Unresected or Locally Advanced Unresectable Disease Progressing after Neoadjuvant and Local Therapies, HER2 Negative/Unknown/Equivocal, ER Positive, Chemotherapy, Third Line and Beyond, Anthracycline  Candidate Therapeutic Status: Distant Metastases ER Status: Positive (+) HER2 Status: Negative (-) PR Status: Negative (-) Therapy Approach Indicated: Standard Chemotherapy/Endocrine Therapy Line of Therapy: Third Line and Beyond Intent of Therapy: Non-Curative / Palliative Intent, Discussed with Patient

## 2021-01-15 NOTE — Addendum Note (Signed)
Addended by: [name] on: 01/15/2021 06:56 PM   Modules accepted: Orders

## 2021-01-16 ENCOUNTER — Encounter (HOSPITAL_COMMUNITY): Payer: Self-pay

## 2021-01-16 NOTE — Progress Notes (Signed)
Caris testing requested on [id number] per Dr. Delton Coombes

## 2021-01-17 NOTE — Progress Notes (Signed)
Crystal Vega, Crystal Vega   CLINIC:  Medical Oncology/Hematology  PCP:  Lemmie Evens, MD [place]. / [place] [address] [phone]   REASON FOR VISIT:  Follow-up for left breast cancer  PRIOR THERAPY:  1. Paclitaxel x 6 cycles from 12/31/2019 to 06/02/2020. 2. Xeloda through 09/17/2020.  NGS Results: ER/HER-2 positive, PR negative, Ki-67 30%  CURRENT THERAPY: Carboplatin & Taxol D1,8,15 every 4 weeks x 6 cycles  BRIEF ONCOLOGIC HISTORY:  Oncology History  Malignant neoplasm of overlapping sites of left breast in female, estrogen receptor positive ([place])  11/16/2019 Cancer Staging   Staging form: Breast, AJCC 8th Edition - Clinical stage from 11/16/2019: Stage IV (cT4b, cN1, cM1, G3, ER+, PR-, HER2-) - Signed by [name], MD on 12/17/2019   11/28/2019 Initial Diagnosis   Patient noted intermittent left breast pain and swelling for several months. Mammogram showed a 6.2cm mass in the left breast axillary tail with an adjacent 0.9cm mass, 2 abnormal left axillary lymph nodes, 1 abnormal right axillary lymph node, and diffuse left breast edema and skin thickening. Biopsy showed IDC, grade 3 in the left breast and bilateral axillas, HER-2 negative (1+), ER+ 100%, PR- 0%, Ki67 30%.    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 female, returns for routine follow-up and consideration for next cycle of chemotherapy. Crystal Vega was last seen on 12/30/20.  Due for cycle #1 of carboplatin + taxol today.   Overall, she tells me she has been feeling pretty well. She reports soreness in her right fingertips and nausea. She reports a lump on the left side of her head that is tender to touch, and she reports bloody stool as well as dull CP. She recently started taking 1 iron tablet a day, and she denies constipation.   Overall, she feels ready for next cycle of chemo today.   REVIEW OF SYSTEMS:  Review of Systems  Constitutional:  Positive for fatigue. Negative for appetite change (75%).  HENT:          Dry mouth  Respiratory:  Positive for shortness of breath.   Cardiovascular:  Positive for chest pain (intermittent).  Gastrointestinal:  Positive for blood in stool and nausea. Negative for constipation.  Genitourinary:  Positive  for vaginal bleeding.   Neurological:  Positive for numbness (soreness in R fingertips).  Psychiatric/Behavioral:  Positive for depression.   All other systems reviewed and are negative.  PAST MEDICAL/SURGICAL HISTORY:  Past Medical History:  Diagnosis Date   Anemia    Arthritis    per patient " left knee"   DVT (deep venous thrombosis) (HCC)    left leg   Dyspnea    Essential hypertension, benign    Family history of cancer of female genital organ    Family history  of GI tract cancer    Metastatic breast cancer ([place])    left breast   Port-A-Cath in place 12/25/2019   Type 2 diabetes mellitus [facility])    Past Surgical History:  Procedure Laterality Date   BIOPSY  11/27/2020   Procedure: BIOPSY;  Surgeon: Daneil Dolin, MD;  [facility];  Service: Endoscopy;;   CATARACT EXTRACTION W/ INTRAOCULAR LENS IMPLANT Right    COLONOSCOPY WITH PROPOFOL N/A 11/27/2020   Procedure: COLONOSCOPY WITH PROPOFOL;  Surgeon: Daneil Dolin, MD;  Location: [facility];  Service: Endoscopy;  Laterality: N/A;  9:00am   HYSTEROSCOPY WITH D & C N/A 06/05/2014   Procedure: DILATATION AND CURETTAGE /HYSTEROSCOPY;  Surgeon: Florian Buff, MD;  Location: [facility] ORS;  Service: Gynecology;  Laterality: N/A;   POLYPECTOMY N/A 06/05/2014   Procedure: ENDOMETRIAL POLYPECTOMY;  Surgeon: Florian Buff, MD;  Location: [facility] ORS;  Service: Gynecology;  Laterality: N/A;   PORTACATH PLACEMENT N/A 12/14/2019   Procedure: INSERTION PORT-A-CATH WITH ULTRASOUND GUIDANCE;  Surgeon: Donnie Mesa, MD;  Location: [facility];  Service: General;  Laterality: N/A;   TUBAL LIGATION      SOCIAL HISTORY:  Social History   Socioeconomic History   Marital status: Married    Spouse name: Herbie Baltimore   Number of children: 2   Years of education: Not on file   Highest education level: Not on file  Occupational History   Occupation: retired  Tobacco Use   Smoking status: Former    Types: Cigarettes   Smokeless tobacco: Never   Tobacco comments:    quit about 40+ years ago  Vaping Use   Vaping Use: Never used  Substance and Sexual Activity   Alcohol use: Never   Drug use: No   Sexual activity: Not on file  Other Topics Concern   Not on file  Social History Narrative   Not on file   Social Determinants of Health   Financial Resource Strain: Not on file  Food Insecurity: Not on file  Transportation Needs: Not on file  Physical Activity: Not on file  Stress: Not on file  Social Connections:  Not on file  Intimate Partner Violence: Not on file    FAMILY HISTORY:  Family History  Problem Relation Age of Onset   Diabetes Mellitus II Father    Congestive Heart Failure Father    Colon cancer Mother 50       patient not sure if colon vs stomach   Stroke Maternal Grandmother    Cancer Cousin        female reproductive cancer, dx. 50s/60s    CURRENT MEDICATIONS:  Current Outpatient Medications  Medication Sig Dispense Refill   apixaban (ELIQUIS) 5 MG TABS tablet Take 1 tablet (5 mg total) by mouth 2 (two) times daily. 60 tablet 3   chlorpheniramine-HYDROcodone (TUSSIONEX) 10-8 MG/5ML SUER SMARTSIG:1 Teaspoon By Mouth Twice Daily (Patient taking differently: Take 5 mLs by mouth daily  as needed for cough.) 480 mL 0   ferrous sulfate 325 (65 FE) MG tablet Take 325 mg by mouth daily with breakfast.     hydrochlorothiazide (HYDRODIURIL) 25 MG tablet Take 25 mg by mouth daily.     HYDROcodone-acetaminophen (NORCO/VICODIN) 5-325 MG tablet Take 1 tablet by mouth every 6 (six) hours as needed for moderate pain. (Patient not taking: Reported on 01/12/2021) 15 tablet 0   HYDROmorphone (DILAUDID) 2 MG tablet Take 1 tablet (2 mg total) by mouth every 4 (four) hours as needed for severe pain. (Patient not taking: Reported on 01/12/2021) 90 tablet 0   Ketotifen Fumarate (ITCHY EYE DROPS OP) Place 1 drop into both eyes daily as needed (Itching eyes).     KLOR-CON M10 10 MEQ tablet Take 10 mEq by mouth daily.     losartan (COZAAR) 50 MG tablet Take 50 mg by mouth daily.     metFORMIN (GLUCOPHAGE) 500 MG tablet Take 500 mg by mouth 2 (two) times daily with a meal.     OVER THE COUNTER MEDICATION Apply 1 application topically daily as needed (Knee pain). Absorbine Jr. Roll on     pravastatin (PRAVACHOL) 10 MG tablet Take 10 mg by mouth daily.      prochlorperazine (COMPAZINE) 10 MG tablet Take 1 tablet (10 mg total) by mouth every 6 (six) hours as needed (Nausea or vomiting). (Patient not taking:  Reported on 01/12/2021) 60 tablet 3   No current facility-administered medications for this visit.    ALLERGIES:  No Known Allergies  PHYSICAL EXAM:  Performance status (ECOG): 1 - Symptomatic but completely ambulatory  There were no vitals filed for this visit. Wt Readings from Last 3 Encounters:  12/30/20 268 lb 3.2 oz (121.7 kg)  12/02/20 274 lb 12.8 oz (124.6 kg)  11/25/20 276 lb (125.2 kg)   Physical Exam Vitals reviewed.  Constitutional:      Appearance: Normal appearance.  Cardiovascular:     Rate and Rhythm: Normal rate and regular rhythm.     Pulses: Normal pulses.     Heart sounds: Normal heart sounds.  Pulmonary:     Effort: Pulmonary effort is normal.     Breath sounds: Normal breath sounds.  Lymphadenopathy:     Cervical: Cervical adenopathy present.     Left cervical: Posterior cervical adenopathy present.  Neurological:     General: No focal deficit present.     Mental Status: She is alert and oriented to person, place, and time.  Psychiatric:        Mood and Affect: Mood normal.        Behavior: Behavior normal.    LABORATORY DATA:  I have reviewed the labs as listed.  CBC Latest Ref Rng & Units 12/30/2020 12/02/2020 10/30/2020  WBC 4.0 - 10.5 K/uL 5.4 5.9 7.4  Hemoglobin 12.0 - 15.0 g/dL 8.3(L) 9.1(L) 8.9(L)  Hematocrit 36.0 - 46.0 % 27.0(L) 29.0(L) 28.6(L)  Platelets 150 - 400 K/uL 329 256 368   CMP Latest Ref Rng & Units 12/30/2020 12/02/2020 10/30/2020  Glucose 70 - 99 mg/dL 158(H) 184(H) 133(H)  BUN 8 - 23 mg/dL 16 15 18   Creatinine 0.44 - 1.00 mg/dL 0.78 0.72 0.72  Sodium 135 - 145 mmol/L 134(L) 137 134(L)  Potassium 3.5 - 5.1 mmol/L 3.3(L) 3.4(L) 3.4(L)  Chloride 98 - 111 mmol/L 101 101 100  CO2 22 - 32 mmol/L 26 26 26   Calcium 8.9 - 10.3 mg/dL 8.4(L) 8.4(L) 8.7(L)  Total Protein 6.5 - 8.1 g/dL  7.0 6.8 7.3  Total Bilirubin 0.3 - 1.2 mg/dL 0.5 0.2(L) 0.4  Alkaline Phos 38 - 126 U/L 84 88 78  AST 15 - 41 U/L 34 32 32  ALT 0 - 44 U/L 13 13 16      DIAGNOSTIC IMAGING:  I have independently reviewed the scans and discussed with the patient. CT CHEST ABDOMEN PELVIS W CONTRAST  Result Date: 01/07/2021 CLINICAL DATA:  Left breast cancer and colon cancer EXAM: CT CHEST, ABDOMEN, AND PELVIS WITH CONTRAST TECHNIQUE: Multidetector CT imaging of the chest, abdomen and pelvis was performed following the standard protocol during bolus administration of intravenous contrast. CONTRAST:  35m OMNIPAQUE IOHEXOL 350 MG/ML SOLN COMPARISON:  CT chest abdomen pelvis dated September 15, 2020 FINDINGS: CT CHEST FINDINGS Cardiovascular: Normal heart size. No pericardial effusion. Right chest wall port with tip in the right ventricle. Mediastinum/Nodes: Stable size of the large left supraclavicular and prevascular mediastinal lymph nodes. Stable enlarged cardiophrenic lymph nodes. Increased size of paraesophageal lymph node measuring 0.9 cm, previously 7 mm. Stable left axillary lymphadenopathy. Dominant left axillary mass located on series 2, image 22 is remeasured in similar plane, measures 3.6 x 3.7 cm, unchanged compared to prior exam. Increased size of right axillary lymph nodes. Reference right axillary lymph node located on series 2, image 34 measures 1.6 cm in short axis, previously 1.1 cm. Lungs/Pleura: New small solid left upper lobe pulmonary nodule measuring 3 mm located on series 3, image 62. No consolidation, pleural effusion or pneumothorax. CT ABDOMEN PELVIS FINDINGS Hepatobiliary: Previously seen liver lesions are not well evaluated due to streak artifact related to patient positioning. Gallbladder is unremarkable. No biliary ductal dilation. Pancreas: Unremarkable. No pancreatic ductal dilatation or surrounding inflammatory changes. Spleen: Normal in size without focal abnormality. Adrenals/Urinary Tract: Normal bilateral adrenal glands. Kidneys enhance symmetrically with no evidence of hydronephrosis. Bilateral renal cysts. Bladder is unremarkable.  Stomach/Bowel: Stomach is within normal limits. Appendix appears normal. Circumferential masslike thickening of the ascending colon as seen on coronal series 4 image 70 is slightly increased compared to prior. Vascular/Lymphatic: Interval increased size of enlarged pericaval lymph node located on series 2, image 58 measuring 2.1 cm in short axis, previously 1.2 cm. Increased size of mesenteric lymph nodes located adjacent to ascending colonic lesion. Reference node located on image 66 measures 7 mm, previously 5 mm. Unchanged mildly enlarged pelvic lymph nodes. Reference node measures 8 mm on series 2, image 92. Reproductive: Fibroid uterus. Previously seen endometrial thickening is not apparent on today's exam. Other: No abdominal wall hernia or abnormality. No abdominopelvic ascites. Musculoskeletal: Stable lytic lesions of the manubrium and T4 vertebral body. IMPRESSION: Mediastinal, supraclavicular and bilateral axillary lymphadenopathy with interval increased size of right axillary lymph nodes and a paraesophageal lymph node. Previously seen liver lesions are not well evaluated due to streak artifact related to patient positioning (arms down). Dedicated liver protocol CT could be performed for better evaluation if this would change clinical management. Interval increased size of enlarged pericaval abdominal lymph node. Circumferential focal wall thickening of the ascending colon is slightly increased compared to prior. Adjacent mesenteric lymph nodes are increased in size compared to prior exam. Unchanged osseous metastatic disease. Previously seen endometrial thickening is not apparent on today's exam. Right chest wall port with tip in the right ventricle. Electronically Signed   By: LYetta GlassmanMD   On: 01/07/2021 11:21     ASSESSMENT:  1.  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  Right breast upper outer quadrant biopsy was also positive for malignancy. -MRI of the breast on 12/10/2019 shows left breast malignancy involving all 4 quadrants and skin of the left breast including 0.5 cm biopsy-proven malignancy within the far posterior outer right breast, abnormal highly suspicious nonmuscle like enhancement within all 4 quadrants of the left breast and diffuse skin thickening.  This abnormal non-mass-like enhancement and skin thickening extends across the midline to the  medial right breast.  At least 7 abnormal left axillary lymph nodes and 1 abnormal right axillary lymph node compatible with metastatic disease. -CT CAP on 12/12/2019 shows bulky lymphadenopathy in the left subpectoral and axillary regions with largest lymph node measuring 3.3 cm.  Mild mediastinal adenopathy in the right paratracheal prevascular and lateral aortic regions, largest lymph node in the lateral aortic region measuring 2.4 cm.  Lymphadenopathy in the inferior jugular and supraclavicular regions, left side greater than right.  Mild lymphadenopathy in the right axilla. -Multiple small hypervascular lesions seen in the right and left lobes consistent with liver metastasis. -Bone scan on 12/12/2019 did not show any bone metastasis. -CT CAP on 12/29/2019 showed possible lytic lesion in the T4 vertebral body. -Liver biopsy on 12/28/2019 consistent with metastatic breast cancer. -Paclitaxel weekly 3 weeks on 1 week off started on 12/31/2019. -CT CAP on 03/20/2020 shows mild decrease in the left axillary, subpectoral, supraclavicular, mediastinal and lower jugular lymphadenopathy.  Stable small low-attenuation liver lesion.  No new progressive metastatic disease.  Stable lesion in the left sternal manubrium and T4 vertebral body.  Stable short segment concentric wall thickening of the right colon with subcentimeter pericolonic lymph nodes.  We will monitor this finding as she has metastatic breast cancer.   She is asymptomatic. -CT CAP on 06/18/1998 6:22 cycles of chemotherapy showed no new or progressive metastatic disease. Similar size of the index and nonindex left axillary subpectoral, supraclavicular and lower jugular lymphadenopathy. - 3 cycles of Doxil from 10/02/2020 through 12/02/2020.   2.  Right colonic mass: -CT scan showed constricting mass involving the ascending colon measuring 3.6 x 3.5 cm with a small 1 cm right pericolonic lymph node. -She never had a colonoscopy.  Denies any bleeding per rectum or melena.   3.  Left leg DVT: -Doppler on 03/03/2020 shows left femoral vein, popliteal vein and posterior tibial vein deep vein thrombosis.   PLAN:  1.  Metastatic left breast cancer to the liver: - We have reviewed CT CAP from 01/06/2021 which showed progression of her disease. - We discussed change in therapy to weekly carboplatin and paclitaxel on days 1, 8, 15 every 28 days. - We talked about side effects in detail. - I reviewed her labs today which showed normal LFTs except slightly elevated AST of 42.  CBC shows normocytic anemia which is likely from myelosuppression.  Will check ferritin, J67 and folic acid.  She reportedly started taking iron tablet. - She will proceed with her week 1 of cycle 1 today.  RTC 2 weeks for follow-up.   2.  Right colonic adenocarcinoma: - Most recent CT scan showed slight worsening of the right colon mass and adenopathy. - We are holding off surgical management of colon cancer because of her advanced breast cancer.   3. Peripheral neuropathy: - She has slight tenderness in the right hand fingertips.  Denies any tingling or numbness in the extremities.  We will closely monitor.  4.  Left leg DVT: - Continue Eliquis twice daily.   5.  Postmenopausal bleeding: - She continues to have blood in the urine every time she urinates. - Ultrasound of the pelvis from 09/23/2020 showed thickened endometrial stripe measuring up to 19.3 mm. - She will follow-up  with her GYN.  6.  Left arm pain: - Pain is better controlled when she started taking Dilaudid every 4 hours. - Continue Dilaudid every 4 hours as needed.  Hopefully she can get some pain control with chemotherapy.   Orders placed this encounter:  No orders of the defined types were placed in this encounter.    [name], MD [place] [phone]   I, Thana Ates, am acting as a scribe for Dr. [name].  I, [name] MD, have reviewed the above documentation for accuracy and completeness, and I agree with the above.

## 2021-01-19 ENCOUNTER — Inpatient Hospital Stay (HOSPITAL_COMMUNITY): Payer: Medicare HMO

## 2021-01-19 ENCOUNTER — Other Ambulatory Visit: Payer: Self-pay

## 2021-01-19 ENCOUNTER — Inpatient Hospital Stay (HOSPITAL_COMMUNITY): Payer: Medicare HMO | Admitting: Hematology

## 2021-01-19 ENCOUNTER — Encounter (HOSPITAL_COMMUNITY): Payer: Self-pay | Admitting: Hematology

## 2021-01-19 VITALS — BP 140/61 | HR 117 | Temp 97.0°F | Resp 20 | Wt 272.6 lb

## 2021-01-19 VITALS — BP 122/60 | HR 97 | Temp 98.1°F | Resp 18

## 2021-01-19 DIAGNOSIS — C50812 Malignant neoplasm of overlapping sites of left female breast: Secondary | ICD-10-CM | POA: Diagnosis not present

## 2021-01-19 DIAGNOSIS — Z5111 Encounter for antineoplastic chemotherapy: Secondary | ICD-10-CM | POA: Diagnosis not present

## 2021-01-19 DIAGNOSIS — Z95828 Presence of other vascular implants and grafts: Secondary | ICD-10-CM

## 2021-01-19 DIAGNOSIS — Z170 Estrogen receptor positive status [ER+]: Secondary | ICD-10-CM

## 2021-01-19 DIAGNOSIS — C9000 Multiple myeloma not having achieved remission: Secondary | ICD-10-CM

## 2021-01-19 LAB — CBC WITH DIFFERENTIAL/PLATELET
Abs Immature Granulocytes: 0.04 10*3/uL (ref 0.00–0.07)
Basophils Absolute: 0.1 10*3/uL (ref 0.0–0.1)
Basophils Relative: 1 %
Eosinophils Absolute: 0.2 10*3/uL (ref 0.0–0.5)
Eosinophils Relative: 2 %
HCT: 27.4 % — ABNORMAL LOW (ref 36.0–46.0)
Hemoglobin: 8.3 g/dL — ABNORMAL LOW (ref 12.0–15.0)
Immature Granulocytes: 1 %
Lymphocytes Relative: 16 %
Lymphs Abs: 1.3 10*3/uL (ref 0.7–4.0)
MCH: 26.3 pg (ref 26.0–34.0)
MCHC: 30.3 g/dL (ref 30.0–36.0)
MCV: 86.7 fL (ref 80.0–100.0)
Monocytes Absolute: 1 10*3/uL (ref 0.1–1.0)
Monocytes Relative: 13 %
Neutro Abs: 5.4 10*3/uL (ref 1.7–7.7)
Neutrophils Relative %: 67 %
Platelets: 272 10*3/uL (ref 150–400)
RBC: 3.16 MIL/uL — ABNORMAL LOW (ref 3.87–5.11)
RDW: 18.6 % — ABNORMAL HIGH (ref 11.5–15.5)
WBC: 8 10*3/uL (ref 4.0–10.5)
nRBC: 0 % (ref 0.0–0.2)

## 2021-01-19 LAB — COMPREHENSIVE METABOLIC PANEL
ALT: 17 U/L (ref 0–44)
AST: 42 U/L — ABNORMAL HIGH (ref 15–41)
Albumin: 3 g/dL — ABNORMAL LOW (ref 3.5–5.0)
Alkaline Phosphatase: 98 U/L (ref 38–126)
Anion gap: 7 (ref 5–15)
BUN: 18 mg/dL (ref 8–23)
CO2: 24 mmol/L (ref 22–32)
Calcium: 8.3 mg/dL — ABNORMAL LOW (ref 8.9–10.3)
Chloride: 103 mmol/L (ref 98–111)
Creatinine, Ser: 0.78 mg/dL (ref 0.44–1.00)
GFR, Estimated: >60 mL/min (ref >60)
Glucose, Bld: 149 mg/dL — ABNORMAL HIGH (ref 70–99)
Potassium: 3.6 mmol/L (ref 3.5–5.1)
Sodium: 134 mmol/L — ABNORMAL LOW (ref 135–145)
Total Bilirubin: 0.4 mg/dL (ref 0.3–1.2)
Total Protein: 6.8 g/dL (ref 6.5–8.1)

## 2021-01-19 MED ORDER — SODIUM CHLORIDE 0.9 % IV SOLN
256.8000 mg | Freq: Once | INTRAVENOUS | Status: AC
  Administered 2021-01-19: 260 mg via INTRAVENOUS
  Filled 2021-01-19: qty 26

## 2021-01-19 MED ORDER — DIPHENHYDRAMINE HCL 50 MG/ML IJ SOLN
50.0000 mg | Freq: Once | INTRAMUSCULAR | Status: AC
  Administered 2021-01-19: 50 mg via INTRAVENOUS
  Filled 2021-01-19: qty 1

## 2021-01-19 MED ORDER — SODIUM CHLORIDE 0.9 % IV SOLN
80.0000 mg/m2 | Freq: Once | INTRAVENOUS | Status: AC
  Administered 2021-01-19: 186 mg via INTRAVENOUS
  Filled 2021-01-19: qty 31

## 2021-01-19 MED ORDER — HEPARIN SOD (PORK) LOCK FLUSH 100 UNIT/ML IV SOLN
500.0000 [IU] | Freq: Once | INTRAVENOUS | Status: AC | PRN
  Administered 2021-01-19: 500 [IU]

## 2021-01-19 MED ORDER — PALONOSETRON HCL INJECTION 0.25 MG/5ML
0.2500 mg | Freq: Once | INTRAVENOUS | Status: AC
  Administered 2021-01-19: 0.25 mg via INTRAVENOUS
  Filled 2021-01-19: qty 5

## 2021-01-19 MED ORDER — SODIUM CHLORIDE 0.9 % IV SOLN
10.0000 mg | Freq: Once | INTRAVENOUS | Status: AC
  Administered 2021-01-19: 10 mg via INTRAVENOUS
  Filled 2021-01-19: qty 10

## 2021-01-19 MED ORDER — FAMOTIDINE 20 MG IN NS 100 ML IVPB
20.0000 mg | Freq: Once | INTRAVENOUS | Status: AC
  Administered 2021-01-19: 20 mg via INTRAVENOUS
  Filled 2021-01-19: qty 20

## 2021-01-19 MED ORDER — SODIUM CHLORIDE 0.9 % IV SOLN: Freq: Once | INTRAVENOUS | Status: AC

## 2021-01-19 MED ORDER — SODIUM CHLORIDE 0.9% FLUSH
10.0000 mL | INTRAVENOUS | Status: DC | PRN
  Administered 2021-01-19: 10 mL

## 2021-01-19 NOTE — Progress Notes (Signed)
Patient arrived today for Day 1 Cycle 1 of Carbo/Taxol. Labs and patient assessed by Dr. Delton Coombes, okay to proceed with treatment per Dr. Delton Coombes. Consent signed by patient with right hand, patient stated she is usually left handed, but unable to use left hand.  Patient tolerated chemotherapy with no complaints voiced. Side effects with management reviewed understanding verbalized. Port site clean and dry with no bruising or swelling noted at site. Good blood return noted before and after administration of chemotherapy. Band aid applied. Patient left in satisfactory condition with VSS and no s/s of distress noted.

## 2021-01-19 NOTE — Patient Instructions (Signed)
[facility]  Discharge Instructions: Thank you for choosing [facility] to provide your oncology and hematology care.  If you have a lab appointment with the [facility], please come in thru the Main Entrance and check in at the main information desk.  Wear comfortable clothing and clothing appropriate for easy access to any Portacath or PICC line.   We strive to give you quality time with your provider. You may need to reschedule your appointment if you arrive late (15 or more minutes).  Arriving late affects you and other patients whose appointments are after yours.  Also, if you miss three or more appointments without notifying the office, you may be dismissed from the clinic at the provider's discretion.      For prescription refill requests, have your pharmacy contact our office and allow 72 hours for refills to be completed.    Today you received the following chemotherapy and/or immunotherapy agents: Carboplatin/Paclitaxel. Return as scheduled.   To help prevent nausea and vomiting after your treatment, we encourage you to take your nausea medication as directed.  BELOW ARE SYMPTOMS THAT SHOULD BE REPORTED IMMEDIATELY: *FEVER GREATER THAN 100.4 F (38 C) OR HIGHER *CHILLS OR SWEATING *NAUSEA AND VOMITING THAT IS NOT CONTROLLED WITH YOUR NAUSEA MEDICATION *UNUSUAL SHORTNESS OF BREATH *UNUSUAL BRUISING OR BLEEDING *URINARY PROBLEMS (pain or burning when urinating, or frequent urination) *BOWEL PROBLEMS (unusual diarrhea, constipation, pain near the anus) TENDERNESS IN MOUTH AND THROAT WITH OR WITHOUT PRESENCE OF ULCERS (sore throat, sores in mouth, or a toothache) UNUSUAL RASH, SWELLING OR PAIN  UNUSUAL VAGINAL DISCHARGE OR ITCHING   Items with * indicate a potential emergency and should be followed up as soon as possible or go to the Emergency Department if any problems should occur.  Please show the CHEMOTHERAPY ALERT CARD or IMMUNOTHERAPY ALERT CARD at  check-in to the Emergency Department and triage nurse.  Should you have questions after your visit or need to cancel or reschedule your appointment, please contact [facility] [phone]  and follow the prompts.  Office hours are 8:00 a.m. to 4:30 p.m. Monday - Friday. Please note that voicemails left after 4:00 p.m. may not be returned until the following business day.  We are closed weekends and major holidays. You have access to a nurse at all times for urgent questions. Please call the main number to the clinic [phone] and follow the prompts.  For any non-urgent questions, you may also contact your provider using MyChart. We now offer e-Visits for anyone 28 and older to request care online for non-urgent symptoms. For details visit [email or web address].   Also download the MyChart app! Go to the app store, search "MyChart", open the app, select [facility], and log in with your MyChart username and password.  Due to Covid, a mask is required upon entering the hospital/clinic. If you do not have a mask, one will be given to you upon arrival. For doctor visits, patients may have 1 support person aged 64 or older with them. For treatment visits, patients cannot have anyone with them due to current Covid guidelines and our immunocompromised population.

## 2021-01-19 NOTE — Progress Notes (Signed)
Patient has been assessed, vital signs and labs have been reviewed by Dr. Katragadda. ANC, Creatinine, LFTs, and Platelets are within treatment parameters per Dr. Katragadda. The patient is good to proceed with treatment at this time. Primary RN and pharmacy aware.  

## 2021-01-19 NOTE — Progress Notes (Signed)
Pharmacist Chemotherapy Monitoring - Initial Assessment    Anticipated start date: 01/19/21   The following has been reviewed per standard work regarding the patient's treatment regimen: The patient's diagnosis, treatment plan and drug doses, and organ/hematologic function Lab orders and baseline tests specific to treatment regimen  The treatment plan start date, drug sequencing, and pre-medications Prior authorization status  Patient's documented medication list, including drug-drug interaction screen and prescriptions for anti-emetics and supportive care specific to the treatment regimen The drug concentrations, fluid compatibility, administration routes, and timing of the medications to be used The patient's access for treatment and lifetime cumulative dose history, if applicable  The patient's medication allergies and previous infusion related reactions, if applicable   Changes made to treatment plan:  N/A  Follow up needed:  N/A   Wynona Neat, [facility], 01/19/2021  9:20 AM

## 2021-01-19 NOTE — Patient Instructions (Addendum)
[facility] at [facility] Discharge Instructions  You were seen today by Dr. Katragadda. He went over your recent results, and you received your treatment. Dr. Katragadda will see you back in 2 weeks for labs and follow up.   Thank you for choosing [facility] at [facility] to provide your oncology and hematology care.  To afford each patient quality time with our provider, please arrive at least 15 minutes before your scheduled appointment time.   If you have a lab appointment with the Cancer Center please come in thru the Main Entrance and check in at the main information desk  You need to re-schedule your appointment should you arrive 10 or more minutes late.  We strive to give you quality time with our providers, and arriving late affects you and other patients whose appointments are after yours.  Also, if you no show three or more times for appointments you may be dismissed from the clinic at the providers discretion.     Again, thank you for choosing [facility].  Our hope is that these requests will decrease the amount of time that you wait before being seen by our physicians.       _____________________________________________________________  Should you have questions after your visit to [facility], please contact our office at [phone] between the hours of 8:00 a.m. and 4:30 p.m.  Voicemails left after 4:00 p.m. will not be returned until the following business day.  For prescription refill requests, have your pharmacy contact our office and allow 72 hours.    Cancer Center Support Programs:   > Cancer Support Group  2nd Tuesday of the month 1pm-2pm, Journey Room   

## 2021-01-20 LAB — CEA: CEA: 18.1 ng/mL — ABNORMAL HIGH (ref 0.0–4.7)

## 2021-01-20 LAB — CANCER ANTIGEN 15-3: CA 15-3: 66.6 U/mL — ABNORMAL HIGH (ref 0.0–25.0)

## 2021-01-20 NOTE — Progress Notes (Signed)
Called patient for 24 hr follow up. Patient states that she feels pretty good and doesn't have any complaints at this time. Informed patient that if she has any issues to call cancer center.

## 2021-01-26 ENCOUNTER — Inpatient Hospital Stay (HOSPITAL_COMMUNITY): Payer: Medicare HMO

## 2021-01-26 ENCOUNTER — Other Ambulatory Visit: Payer: Self-pay

## 2021-01-26 VITALS — BP 116/71 | HR 96 | Temp 98.5°F | Resp 18 | Wt 265.4 lb

## 2021-01-26 DIAGNOSIS — Z170 Estrogen receptor positive status [ER+]: Secondary | ICD-10-CM

## 2021-01-26 DIAGNOSIS — Z95828 Presence of other vascular implants and grafts: Secondary | ICD-10-CM

## 2021-01-26 DIAGNOSIS — C9000 Multiple myeloma not having achieved remission: Secondary | ICD-10-CM

## 2021-01-26 DIAGNOSIS — C50812 Malignant neoplasm of overlapping sites of left female breast: Secondary | ICD-10-CM

## 2021-01-26 DIAGNOSIS — Z5111 Encounter for antineoplastic chemotherapy: Secondary | ICD-10-CM | POA: Diagnosis not present

## 2021-01-26 LAB — CBC WITH DIFFERENTIAL/PLATELET
Abs Immature Granulocytes: 0.03 10*3/uL (ref 0.00–0.07)
Basophils Absolute: 0 10*3/uL (ref 0.0–0.1)
Basophils Relative: 1 %
Eosinophils Absolute: 0.1 10*3/uL (ref 0.0–0.5)
Eosinophils Relative: 2 %
HCT: 26.4 % — ABNORMAL LOW (ref 36.0–46.0)
Hemoglobin: 8.1 g/dL — ABNORMAL LOW (ref 12.0–15.0)
Immature Granulocytes: 1 %
Lymphocytes Relative: 28 %
Lymphs Abs: 1.3 10*3/uL (ref 0.7–4.0)
MCH: 25.9 pg — ABNORMAL LOW (ref 26.0–34.0)
MCHC: 30.7 g/dL (ref 30.0–36.0)
MCV: 84.3 fL (ref 80.0–100.0)
Monocytes Absolute: 0.2 10*3/uL (ref 0.1–1.0)
Monocytes Relative: 5 %
Neutro Abs: 2.8 10*3/uL (ref 1.7–7.7)
Neutrophils Relative %: 63 %
Platelets: 262 10*3/uL (ref 150–400)
RBC: 3.13 MIL/uL — ABNORMAL LOW (ref 3.87–5.11)
RDW: 18.4 % — ABNORMAL HIGH (ref 11.5–15.5)
WBC: 4.5 10*3/uL (ref 4.0–10.5)
nRBC: 0 % (ref 0.0–0.2)

## 2021-01-26 LAB — COMPREHENSIVE METABOLIC PANEL
ALT: 21 U/L (ref 0–44)
AST: 39 U/L (ref 15–41)
Albumin: 3.2 g/dL — ABNORMAL LOW (ref 3.5–5.0)
Alkaline Phosphatase: 112 U/L (ref 38–126)
Anion gap: 8 (ref 5–15)
BUN: 18 mg/dL (ref 8–23)
CO2: 24 mmol/L (ref 22–32)
Calcium: 8.7 mg/dL — ABNORMAL LOW (ref 8.9–10.3)
Chloride: 102 mmol/L (ref 98–111)
Creatinine, Ser: 0.69 mg/dL (ref 0.44–1.00)
GFR, Estimated: >60 mL/min (ref >60)
Glucose, Bld: 164 mg/dL — ABNORMAL HIGH (ref 70–99)
Potassium: 4.1 mmol/L (ref 3.5–5.1)
Sodium: 134 mmol/L — ABNORMAL LOW (ref 135–145)
Total Bilirubin: 0.2 mg/dL — ABNORMAL LOW (ref 0.3–1.2)
Total Protein: 7.3 g/dL (ref 6.5–8.1)

## 2021-01-26 LAB — IRON AND TIBC
Iron: 23 ug/dL — ABNORMAL LOW (ref 28–170)
Saturation Ratios: 5 % — ABNORMAL LOW (ref 10.4–31.8)
TIBC: 448 ug/dL (ref 250–450)
UIBC: 425 ug/dL

## 2021-01-26 LAB — VITAMIN B12: Vitamin B-12: 347 pg/mL (ref 180–914)

## 2021-01-26 LAB — FERRITIN: Ferritin: 73 ng/mL (ref 11–307)

## 2021-01-26 LAB — FOLATE: Folate: 5.2 ng/mL — ABNORMAL LOW (ref >5.9)

## 2021-01-26 MED ORDER — SODIUM CHLORIDE 0.9 % IV SOLN
80.0000 mg/m2 | Freq: Once | INTRAVENOUS | Status: AC
  Administered 2021-01-26: 186 mg via INTRAVENOUS
  Filled 2021-01-26: qty 31

## 2021-01-26 MED ORDER — PALONOSETRON HCL INJECTION 0.25 MG/5ML
0.2500 mg | Freq: Once | INTRAVENOUS | Status: AC
  Administered 2021-01-26: 0.25 mg via INTRAVENOUS
  Filled 2021-01-26: qty 5

## 2021-01-26 MED ORDER — SODIUM CHLORIDE 0.9 % IV SOLN
10.0000 mg | Freq: Once | INTRAVENOUS | Status: AC
  Administered 2021-01-26: 10 mg via INTRAVENOUS
  Filled 2021-01-26: qty 10

## 2021-01-26 MED ORDER — DIPHENHYDRAMINE HCL 50 MG/ML IJ SOLN
50.0000 mg | Freq: Once | INTRAMUSCULAR | Status: AC
  Administered 2021-01-26: 50 mg via INTRAVENOUS
  Filled 2021-01-26: qty 1

## 2021-01-26 MED ORDER — SODIUM CHLORIDE 0.9 % IV SOLN: Freq: Once | INTRAVENOUS | Status: AC

## 2021-01-26 MED ORDER — FAMOTIDINE 20 MG IN NS 100 ML IVPB
20.0000 mg | Freq: Once | INTRAVENOUS | Status: AC
  Administered 2021-01-26: 20 mg via INTRAVENOUS
  Filled 2021-01-26: qty 100

## 2021-01-26 MED ORDER — SODIUM CHLORIDE 0.9 % IV SOLN
256.8000 mg | Freq: Once | INTRAVENOUS | Status: AC
  Administered 2021-01-26: 260 mg via INTRAVENOUS
  Filled 2021-01-26: qty 26

## 2021-01-26 MED ORDER — SODIUM CHLORIDE 0.9% FLUSH
10.0000 mL | INTRAVENOUS | Status: DC | PRN
  Administered 2021-01-26: 10 mL

## 2021-01-26 MED ORDER — HEPARIN SOD (PORK) LOCK FLUSH 100 UNIT/ML IV SOLN
500.0000 [IU] | Freq: Once | INTRAVENOUS | Status: AC | PRN
  Administered 2021-01-26: 500 [IU]

## 2021-01-26 NOTE — Progress Notes (Signed)
Carbo/Taxol given today per MD orders. Tolerated infusion without adverse affects. Vital signs stable. No complaints at this time. Discharged from clinic via wheelchair in stable condition. Alert and oriented x 3. F/U with [facility] as scheduled.

## 2021-01-26 NOTE — Progress Notes (Signed)
OK to sign treatment plan per Dr Delton Coombes.    Henreitta Leber, PharmD

## 2021-01-26 NOTE — Progress Notes (Signed)
Patient presents today for treatment. Taxol/Carboplatin. Vital signs within parameters for treatment. MAR reviewed and updated. Patient denies any significant changes since her last visit.   Labs within parameters for treatment.

## 2021-01-26 NOTE — Patient Instructions (Signed)
[facility]  Discharge Instructions: Thank you for choosing [facility] to provide your oncology and hematology care.  If you have a lab appointment with the [facility], please come in thru the Main Entrance and check in at the main information desk.  Wear comfortable clothing and clothing appropriate for easy access to any Portacath or PICC line.   We strive to give you quality time with your provider. You may need to reschedule your appointment if you arrive late (15 or more minutes).  Arriving late affects you and other patients whose appointments are after yours.  Also, if you miss three or more appointments without notifying the office, you may be dismissed from the clinic at the provider's discretion.      For prescription refill requests, have your pharmacy contact our office and allow 72 hours for refills to be completed.    Today you received the following chemotherapy and/or immunotherapy agents Taxol/Carb.   To help prevent nausea and vomiting after your treatment, we encourage you to take your nausea medication as directed.  BELOW ARE SYMPTOMS THAT SHOULD BE REPORTED IMMEDIATELY: *FEVER GREATER THAN 100.4 F (38 C) OR HIGHER *CHILLS OR SWEATING *NAUSEA AND VOMITING THAT IS NOT CONTROLLED WITH YOUR NAUSEA MEDICATION *UNUSUAL SHORTNESS OF BREATH *UNUSUAL BRUISING OR BLEEDING *URINARY PROBLEMS (pain or burning when urinating, or frequent urination) *BOWEL PROBLEMS (unusual diarrhea, constipation, pain near the anus) TENDERNESS IN MOUTH AND THROAT WITH OR WITHOUT PRESENCE OF ULCERS (sore throat, sores in mouth, or a toothache) UNUSUAL RASH, SWELLING OR PAIN  UNUSUAL VAGINAL DISCHARGE OR ITCHING   Items with * indicate a potential emergency and should be followed up as soon as possible or go to the Emergency Department if any problems should occur.  Please show the CHEMOTHERAPY ALERT CARD or IMMUNOTHERAPY ALERT CARD at check-in to the Emergency  Department and triage nurse.  Should you have questions after your visit or need to cancel or reschedule your appointment, please contact [facility] [phone]  and follow the prompts.  Office hours are 8:00 a.m. to 4:30 p.m. Monday - Friday. Please note that voicemails left after 4:00 p.m. may not be returned until the following business day.  We are closed weekends and major holidays. You have access to a nurse at all times for urgent questions. Please call the main number to the clinic [phone] and follow the prompts.  For any non-urgent questions, you may also contact your provider using MyChart. We now offer e-Visits for anyone 34 and older to request care online for non-urgent symptoms. For details visit [email or web address].   Also download the MyChart app! Go to the app store, search "MyChart", open the app, select [facility], and log in with your MyChart username and password.  Due to Covid, a mask is required upon entering the hospital/clinic. If you do not have a mask, one will be given to you upon arrival. For doctor visits, patients may have 1 support person aged 21 or older with them. For treatment visits, patients cannot have anyone with them due to current Covid guidelines and our immunocompromised population.

## 2021-01-30 ENCOUNTER — Other Ambulatory Visit (HOSPITAL_COMMUNITY): Payer: Self-pay

## 2021-01-30 MED ORDER — APIXABAN 5 MG PO TABS: 5.0000 mg | ORAL_TABLET | Freq: Two times a day (BID) | ORAL | 3 refills | Status: AC

## 2021-02-01 NOTE — Progress Notes (Signed)
 [facility] [address] [place], [place] [address]   CLINIC:  Medical Oncology/Hematology  PCP:  Knowlton, Steve, MD [address] / [place] [address] [phone]   REASON FOR VISIT:  Follow-up for left breast cancer  PRIOR THERAPY:  1. Paclitaxel x 6 cycles from 12/31/2019 to 06/02/2020. 2. Xeloda through 09/17/2020.  NGS Results: ER/HER-2 positive, PR negative, Ki-67 30%  CURRENT THERAPY: Carboplatin & Taxol D1,8,15 every 4 weeks x 6 cycles  BRIEF ONCOLOGIC HISTORY:  Oncology History  Malignant neoplasm of overlapping sites of left breast in female, estrogen receptor positive (HCC)  11/16/2019 Cancer Staging   Staging form: Breast, AJCC 8th Edition - Clinical stage from 11/16/2019: Stage IV (cT4b, cN1, cM1, G3, ER+, PR-, HER2-) - Signed by Katragadda, Sreedhar, MD on 12/17/2019   11/28/2019 Initial Diagnosis   Patient noted intermittent left breast pain and swelling for several months. Mammogram showed a 6.2cm mass in the left breast axillary tail with an adjacent 0.9cm mass, 2 abnormal left axillary lymph nodes, 1 abnormal right axillary lymph node, and diffuse left breast edema and skin thickening. Biopsy showed IDC, grade 3 in the left breast and bilateral axillas, HER-2 negative (1+), ER+ 100%, PR- 0%, Ki67 30%.    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, a 69 y.o. female, returns for routine follow-up and consideration for next cycle of chemotherapy. Crystal Vega was last seen on 01/19/2021.  Due for day #15 cycle #1 of carboplatin + taxol today.   Overall, she tells me she has been feeling pretty well. She reports severe fatigue worsening over the past 2 weeks; this fatigue was present prior to treatment. She reports she no longer tingling in her fingertips or pain in her arm, and she has not taken any pain medication over the past week. She denies hematuria. She began taking iron tablets last week. She reports diarrhea last week and constipation this week. She reports baseline tingling in her feet.   Overall, she does not feel ready for next cycle of chemo today.   REVIEW OF SYSTEMS:  Review of Systems  Constitutional:  Positive for fatigue (depleted). Negative for appetite change (75%).  HENT:   Positive for trouble swallowing.   Respiratory:  Positive for cough and shortness of breath.     Gastrointestinal:  Positive for constipation, diarrhea and vomiting.  Genitourinary:  Positive for hematuria.   Neurological:  Positive for numbness (tingling in feet).  Psychiatric/Behavioral:  Positive for sleep disturbance.   All other systems reviewed and are negative.  PAST MEDICAL/SURGICAL HISTORY:  Past Medical History:  Diagnosis Date   Anemia    Arthritis    per patient " left knee"   DVT (deep venous thrombosis) (HCC)    left  leg   Dyspnea    Essential hypertension, benign    Family history of cancer of female genital organ    Family history of GI tract cancer    Metastatic breast cancer ([place])    left breast   Port-A-Cath in place 12/25/2019   Type 2 diabetes mellitus [facility])    Past Surgical History:  Procedure Laterality Date   BIOPSY  11/27/2020   Procedure: BIOPSY;  Surgeon: Daneil Dolin, MD;  Location: [facility] ENDO [facility];  Service: Endoscopy;;   CATARACT EXTRACTION W/ INTRAOCULAR LENS IMPLANT Right    COLONOSCOPY WITH PROPOFOL N/A 11/27/2020   Procedure: COLONOSCOPY WITH PROPOFOL;  Surgeon: Daneil Dolin, MD;  Location: [facility] END[facility];  Service: Endoscopy;  Laterality: N/A;  9:00am   HYSTEROSCOPY WITH D & C N/A 06/05/2014   Procedure: DILATATION AND CURETTAGE /HYSTEROSCOPY;  Surgeon: Florian Buff, MD;  Location: [facility] ORS;  Service: Gynecology;  Laterality: N/A;   POLYPECTOMY N/A 06/05/2014   Procedure: ENDOMETRIAL POLYPECTOMY;  Surgeon: Florian Buff, MD;  Location: [facility] ORS;  Service: Gynecology;  Laterality: N/A;   PORTACATH PLACEMENT N/A 12/14/2019   Procedure: INSERTION PORT-A-CATH WITH ULTRASOUND GUIDANCE;  Surgeon: Donnie Mesa, MD;  Location: [facility];  Service: General;  Laterality: N/A;   TUBAL LIGATION      SOCIAL HISTORY:  Social History   Socioeconomic History   Marital status: Married    Spouse name: Herbie [place]   Number of children: 2   Years of education: Not on file   Highest education level: Not on file  Occupational History   Occupation: retired  Tobacco Use   Smoking status: Former    Types: Cigarettes   Smokeless tobacco: Never   Tobacco comments:    quit about 40+ years ago  Vaping Use   Vaping Use: Never used  Substance and Sexual Activity   Alcohol use: Never   Drug use: No   Sexual activity: Not on file  Other Topics Concern   Not on file  Social History Narrative   Not on file   Social Determinants of Health   Financial Resource Strain: Not on file  Food Insecurity: Not  on file  Transportation Needs: Not on file  Physical Activity: Not on file  Stress: Not on file  Social Connections: Not on file  Intimate Partner Violence: Not on file    FAMILY HISTORY:  Family History  Problem Relation Age of Onset   Diabetes Mellitus II Father    Congestive Heart Failure Father    Colon cancer Mother 103       patient not sure if colon vs stomach   Stroke Maternal Grandmother    Cancer Cousin        female reproductive cancer, dx. 50s/60s    CURRENT MEDICATIONS:  Current Outpatient Medications  Medication Sig Dispense Refill   apixaban (ELIQUIS) 5 MG TABS tablet Take 1 tablet (5 mg total) by mouth 2 (two) times daily. 60 tablet 3   chlorpheniramine-HYDROcodone (TUSSIONEX) 10-8 MG/5ML SUER SMARTSIG:1 Teaspoon By Mouth  Twice Daily (Patient taking differently: Take 5 mLs by mouth daily as needed for cough.) 480 mL 0   ferrous sulfate 325 (65 FE) MG tablet Take 325 mg by mouth daily with breakfast.     hydrochlorothiazide (HYDRODIURIL) 25 MG tablet Take 25 mg by mouth daily.     HYDROcodone-acetaminophen (NORCO/VICODIN) 5-325 MG tablet Take 1 tablet by mouth every 6 (six) hours as needed for moderate pain. 15 tablet 0   HYDROmorphone (DILAUDID) 2 MG tablet Take 1 tablet (2 mg total) by mouth every 4 (four) hours as needed for severe pain. 90 tablet 0   ibuprofen (ADVIL) 800 MG tablet Take 800 mg by mouth 3 (three) times daily.     Ketotifen Fumarate (ITCHY EYE DROPS OP) Place 1 drop into both eyes daily as needed (Itching eyes).     KLOR-CON M10 10 MEQ tablet Take 10 mEq by mouth daily.     losartan (COZAAR) 50 MG tablet Take 50 mg by mouth daily.     metFORMIN (GLUCOPHAGE) 500 MG tablet Take 500 mg by mouth 2 (two) times daily with a meal.     OVER THE COUNTER MEDICATION Apply 1 application topically daily as needed (Knee pain). Absorbine Jr. Roll on     pravastatin (PRAVACHOL) 10 MG tablet Take 10 mg by mouth daily.      prochlorperazine (COMPAZINE) 10 MG tablet  Take 1 tablet (10 mg total) by mouth every 6 (six) hours as needed (Nausea or vomiting). 60 tablet 3   No current facility-administered medications for this visit.    ALLERGIES:  No Known Allergies  PHYSICAL EXAM:  Performance status (ECOG): 1 - Symptomatic but completely ambulatory  There were no vitals filed for this visit. Wt Readings from Last 3 Encounters:  01/26/21 265 lb 6.4 oz (120.4 kg)  01/19/21 272 lb 9.6 oz (123.7 kg)  12/30/20 268 lb 3.2 oz (121.7 kg)   Physical Exam Vitals reviewed.  Constitutional:      Appearance: Normal appearance.     Comments: In wheelchair  Cardiovascular:     Rate and Rhythm: Normal rate and regular rhythm.     Pulses: Normal pulses.     Heart sounds: Normal heart sounds.  Pulmonary:     Effort: Pulmonary effort is normal.     Breath sounds: Normal breath sounds.  Chest:  Breasts:    Left: Skin change (blisters) present.  Neurological:     General: No focal deficit present.     Mental Status: She is alert and oriented to person, place, and time.  Psychiatric:        Mood and Affect: Mood normal.        Behavior: Behavior normal.    LABORATORY DATA:  I have reviewed the labs as listed.  CBC Latest Ref Rng & Units 01/26/2021 01/19/2021 12/30/2020  WBC 4.0 - 10.5 K/uL 4.5 8.0 5.4  Hemoglobin 12.0 - 15.0 g/dL 8.1(L) 8.3(L) 8.3(L)  Hematocrit 36.0 - 46.0 % 26.4(L) 27.4(L) 27.0(L)  Platelets 150 - 400 K/uL 262 272 329   CMP Latest Ref Rng & Units 01/26/2021 01/19/2021 12/30/2020  Glucose 70 - 99 mg/dL 164(H) 149(H) 158(H)  BUN 8 - 23 mg/dL _0 Creatinine 0.44 - 1.00 mg/dL 0.69 0.78 0.78  Sodium 135 - 145 mmol/L 134(L) 134(L) 134(L)  Potassium 3.5 - 5.1 mmol/L 4.1 3.6 3.3(L)  Chloride 98 - 111 mmol/L 102 103 101  CO2 22 - 32 mmol/L _1 Calcium 8.9 -  10.3 mg/dL 8.7(L) 8.3(L) 8.4(L)  Total Protein 6.5 - 8.1 g/dL 7.3 6.8 7.0  Total Bilirubin 0.3 - 1.2 mg/dL 0.2(L) 0.4 0.5  Alkaline Phos 38 - 126 U/L 112 98 84  AST 15 - 41 U/L  39 42(H) 34  ALT 0 - 44 U/L _0 DIAGNOSTIC IMAGING:  I have independently reviewed the scans and discussed with the patient. CT CHEST ABDOMEN PELVIS W CONTRAST  Result Date: 01/07/2021 CLINICAL DATA:  Left breast cancer and colon cancer EXAM: CT CHEST, ABDOMEN, AND PELVIS WITH CONTRAST TECHNIQUE: Multidetector CT imaging of the chest, abdomen and pelvis was performed following the standard protocol during bolus administration of intravenous contrast. CONTRAST:  75m OMNIPAQUE IOHEXOL 350 MG/ML SOLN COMPARISON:  CT chest abdomen pelvis dated September 15, 2020 FINDINGS: CT CHEST FINDINGS Cardiovascular: Normal heart size. No pericardial effusion. Right chest wall port with tip in the right ventricle. Mediastinum/Nodes: Stable size of the large left supraclavicular and prevascular mediastinal lymph nodes. Stable enlarged cardiophrenic lymph nodes. Increased size of paraesophageal lymph node measuring 0.9 cm, previously 7 mm. Stable left axillary lymphadenopathy. Dominant left axillary mass located on series 2, image 22 is remeasured in similar plane, measures 3.6 x 3.7 cm, unchanged compared to prior exam. Increased size of right axillary lymph nodes. Reference right axillary lymph node located on series 2, image 34 measures 1.6 cm in short axis, previously 1.1 cm. Lungs/Pleura: New small solid left upper lobe pulmonary nodule measuring 3 mm located on series 3, image 62. No consolidation, pleural effusion or pneumothorax. CT ABDOMEN PELVIS FINDINGS Hepatobiliary: Previously seen liver lesions are not well evaluated due to streak artifact related to patient positioning. Gallbladder is unremarkable. No biliary ductal dilation. Pancreas: Unremarkable. No pancreatic ductal dilatation or surrounding inflammatory changes. Spleen: Normal in size without focal abnormality. Adrenals/Urinary Tract: Normal bilateral adrenal glands. Kidneys enhance symmetrically with no evidence of hydronephrosis. Bilateral renal  cysts. Bladder is unremarkable. Stomach/Bowel: Stomach is within normal limits. Appendix appears normal. Circumferential masslike thickening of the ascending colon as seen on coronal series 4 image 70 is slightly increased compared to prior. Vascular/Lymphatic: Interval increased size of enlarged pericaval lymph node located on series 2, image 58 measuring 2.1 cm in short axis, previously 1.2 cm. Increased size of mesenteric lymph nodes located adjacent to ascending colonic lesion. Reference node located on image 66 measures 7 mm, previously 5 mm. Unchanged mildly enlarged pelvic lymph nodes. Reference node measures 8 mm on series 2, image 92. Reproductive: Fibroid uterus. Previously seen endometrial thickening is not apparent on today's exam. Other: No abdominal wall hernia or abnormality. No abdominopelvic ascites. Musculoskeletal: Stable lytic lesions of the manubrium and T4 vertebral body. IMPRESSION: Mediastinal, supraclavicular and bilateral axillary lymphadenopathy with interval increased size of right axillary lymph nodes and a paraesophageal lymph node. Previously seen liver lesions are not well evaluated due to streak artifact related to patient positioning (arms down). Dedicated liver protocol CT could be performed for better evaluation if this would change clinical management. Interval increased size of enlarged pericaval abdominal lymph node. Circumferential focal wall thickening of the ascending colon is slightly increased compared to prior. Adjacent mesenteric lymph nodes are increased in size compared to prior exam. Unchanged osseous metastatic disease. Previously seen endometrial thickening is not apparent on today's exam. Right chest wall port with tip in the right ventricle. Electronically Signed   By: LYetta GlassmanMD   On: 01/07/2021 11:21     ASSESSMENT:  1.  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  Right breast upper outer quadrant biopsy was also positive for malignancy. -MRI of the breast on 12/10/2019 shows left breast malignancy involving all 4 quadrants and skin of the left breast including 0.5 cm biopsy-proven malignancy within the far posterior outer right breast, abnormal highly suspicious nonmuscle like enhancement within all 4 quadrants of the left breast and diffuse skin thickening.  This abnormal non-mass-like enhancement and skin thickening extends across the midline to the  medial right breast.  At least 7 abnormal left axillary lymph nodes and 1 abnormal right axillary lymph node compatible with metastatic disease. -CT CAP on 12/12/2019 shows bulky lymphadenopathy in the left subpectoral and axillary regions with largest lymph node measuring 3.3 cm.  Mild mediastinal adenopathy in the right paratracheal prevascular and lateral aortic regions, largest lymph node in the lateral aortic region measuring 2.4 cm.  Lymphadenopathy in the inferior jugular and supraclavicular regions, left side greater than right.  Mild lymphadenopathy in the right axilla. -Multiple small hypervascular lesions seen in the right and left lobes consistent with liver metastasis. -Bone scan on 12/12/2019 did not show any bone metastasis. -CT CAP on 12/29/2019 showed possible lytic lesion in the T4 vertebral body. -Liver biopsy on 12/28/2019 consistent with metastatic breast cancer. -Paclitaxel weekly 3 weeks on 1 week off started on 12/31/2019. -CT CAP on 03/20/2020 shows mild decrease in the left axillary, subpectoral, supraclavicular, mediastinal and lower jugular lymphadenopathy.  Stable small low-attenuation liver lesion.  No new progressive metastatic disease.  Stable lesion in the left sternal manubrium and T4 vertebral body.  Stable short segment concentric wall thickening of the right colon with subcentimeter pericolonic lymph nodes.  We will monitor this finding as she  has metastatic breast cancer.  She is asymptomatic. -CT CAP on 06/18/1998 6:22 cycles of chemotherapy showed no new or progressive metastatic disease. Similar size of the index and nonindex left axillary subpectoral, supraclavicular and lower jugular lymphadenopathy. - 3 cycles of Doxil from 10/02/2020 through 12/02/2020.   2.  Right colonic mass: -CT scan showed constricting mass involving the ascending colon measuring 3.6 x 3.5 cm with a small 1 cm right pericolonic lymph node. -She never had a colonoscopy.  Denies any bleeding per rectum or melena.   3.  Left leg DVT: -Doppler on 03/03/2020 shows left femoral vein, popliteal vein and posterior tibial vein deep vein thrombosis.   PLAN:  1.  Metastatic left breast cancer to the liver: - CT CAP from 01/06/2021 showed progression of disease. - We started her on weekly carboplatin and paclitaxel days 1, 8, 15 every 28 days on 01/19/2021. - She complained of feeling a slight worsening of tiredness since she started chemotherapy. - Reviewed her labs which showed white count 2.4 with ANC 1000.  Platelet count is 237 and hemoglobin 8.1.  Potassium is 3.1. - We will hold her chemotherapy due to low ANC.  She will receive G-CSF today. - I will cut back on dose of carboplatin to 200 mg and paclitaxel by 20% for day 15 tomorrow as she is complaining of feeling tired.  If her tiredness improves, I will start her back at regular doses with cycle 2.  RTC 2 weeks for cycle 2. - We will also give her Venofer 300 mg IV today to see if it helps improve her fatigue.  Last ferritin was 73   and percent saturation was 5.  She was also told to start folic acid 1 tablet daily as her folic acid was low at 5.2.   2.  Right colonic adenocarcinoma: - Most recent CT scan showed slight worsening of right colon mass and adenopathy. - We are holding off on surgical management of colon cancer because of her advanced breast cancer.   3. Peripheral neuropathy: - Mild tenderness in  the right hand fingertips.  Denies any tingling or numbness in the lower extremities.   4.  Left leg DVT: - Continue Eliquis twice daily.   5.  Postmenopausal bleeding: - She used to have blood on urination. - This has stopped in the last 1 week.  She is still has follow-up with GYN.  6.  Left arm pain: - This has completely improved since the start of chemotherapy.  She did not require any Dilaudid in the last 1 week.   Orders placed this encounter:  No orders of the defined types were placed in this encounter.    Sreedhar Katragadda, MD [facility] [phone]   I, Kirstyn Evans, am acting as a scribe for Dr. Sreedhar Katragadda.  I, Sreedhar Katragadda MD, have reviewed the above documentation for accuracy and completeness, and I agree with the above.     

## 2021-02-03 ENCOUNTER — Other Ambulatory Visit: Payer: Self-pay

## 2021-02-03 ENCOUNTER — Encounter (HOSPITAL_COMMUNITY): Payer: Self-pay

## 2021-02-03 ENCOUNTER — Other Ambulatory Visit (HOSPITAL_COMMUNITY): Payer: Self-pay | Admitting: *Deleted

## 2021-02-03 ENCOUNTER — Inpatient Hospital Stay (HOSPITAL_COMMUNITY): Payer: Medicare HMO | Attending: Hematology

## 2021-02-03 ENCOUNTER — Inpatient Hospital Stay (HOSPITAL_COMMUNITY): Payer: Medicare HMO | Admitting: Hematology

## 2021-02-03 ENCOUNTER — Encounter (HOSPITAL_COMMUNITY): Payer: Self-pay | Admitting: Hematology

## 2021-02-03 ENCOUNTER — Inpatient Hospital Stay (HOSPITAL_COMMUNITY): Payer: Medicare HMO

## 2021-02-03 VITALS — BP 97/60 | HR 90 | Temp 97.1°F | Resp 20

## 2021-02-03 VITALS — BP 122/76 | HR 119 | Temp 97.1°F | Resp 20 | Wt 262.4 lb

## 2021-02-03 DIAGNOSIS — R5383 Other fatigue: Secondary | ICD-10-CM | POA: Diagnosis not present

## 2021-02-03 DIAGNOSIS — Z5189 Encounter for other specified aftercare: Secondary | ICD-10-CM | POA: Insufficient documentation

## 2021-02-03 DIAGNOSIS — C50611 Malignant neoplasm of axillary tail of right female breast: Secondary | ICD-10-CM | POA: Diagnosis present

## 2021-02-03 DIAGNOSIS — I10 Essential (primary) hypertension: Secondary | ICD-10-CM | POA: Insufficient documentation

## 2021-02-03 DIAGNOSIS — R059 Cough, unspecified: Secondary | ICD-10-CM | POA: Diagnosis not present

## 2021-02-03 DIAGNOSIS — C50812 Malignant neoplasm of overlapping sites of left female breast: Secondary | ICD-10-CM | POA: Diagnosis not present

## 2021-02-03 DIAGNOSIS — R6883 Chills (without fever): Secondary | ICD-10-CM | POA: Diagnosis not present

## 2021-02-03 DIAGNOSIS — G479 Sleep disorder, unspecified: Secondary | ICD-10-CM | POA: Diagnosis not present

## 2021-02-03 DIAGNOSIS — D649 Anemia, unspecified: Secondary | ICD-10-CM | POA: Insufficient documentation

## 2021-02-03 DIAGNOSIS — Z7984 Long term (current) use of oral hypoglycemic drugs: Secondary | ICD-10-CM | POA: Insufficient documentation

## 2021-02-03 DIAGNOSIS — N950 Postmenopausal bleeding: Secondary | ICD-10-CM | POA: Insufficient documentation

## 2021-02-03 DIAGNOSIS — Z79899 Other long term (current) drug therapy: Secondary | ICD-10-CM | POA: Insufficient documentation

## 2021-02-03 DIAGNOSIS — Z5111 Encounter for antineoplastic chemotherapy: Secondary | ICD-10-CM | POA: Insufficient documentation

## 2021-02-03 DIAGNOSIS — R1110 Vomiting, unspecified: Secondary | ICD-10-CM | POA: Insufficient documentation

## 2021-02-03 DIAGNOSIS — Z7901 Long term (current) use of anticoagulants: Secondary | ICD-10-CM | POA: Diagnosis not present

## 2021-02-03 DIAGNOSIS — Z87891 Personal history of nicotine dependence: Secondary | ICD-10-CM | POA: Insufficient documentation

## 2021-02-03 DIAGNOSIS — Z86718 Personal history of other venous thrombosis and embolism: Secondary | ICD-10-CM | POA: Insufficient documentation

## 2021-02-03 DIAGNOSIS — Z170 Estrogen receptor positive status [ER+]: Secondary | ICD-10-CM

## 2021-02-03 DIAGNOSIS — D702 Other drug-induced agranulocytosis: Secondary | ICD-10-CM

## 2021-02-03 DIAGNOSIS — C182 Malignant neoplasm of ascending colon: Secondary | ICD-10-CM | POA: Diagnosis not present

## 2021-02-03 DIAGNOSIS — G620 Drug-induced polyneuropathy: Secondary | ICD-10-CM | POA: Diagnosis not present

## 2021-02-03 DIAGNOSIS — C9000 Multiple myeloma not having achieved remission: Secondary | ICD-10-CM

## 2021-02-03 DIAGNOSIS — D509 Iron deficiency anemia, unspecified: Secondary | ICD-10-CM | POA: Insufficient documentation

## 2021-02-03 DIAGNOSIS — K5900 Constipation, unspecified: Secondary | ICD-10-CM | POA: Insufficient documentation

## 2021-02-03 DIAGNOSIS — R197 Diarrhea, unspecified: Secondary | ICD-10-CM | POA: Diagnosis not present

## 2021-02-03 DIAGNOSIS — R0602 Shortness of breath: Secondary | ICD-10-CM | POA: Diagnosis not present

## 2021-02-03 DIAGNOSIS — E119 Type 2 diabetes mellitus without complications: Secondary | ICD-10-CM | POA: Insufficient documentation

## 2021-02-03 DIAGNOSIS — D701 Agranulocytosis secondary to cancer chemotherapy: Secondary | ICD-10-CM | POA: Insufficient documentation

## 2021-02-03 LAB — COMPREHENSIVE METABOLIC PANEL
ALT: 19 U/L (ref 0–44)
AST: 36 U/L (ref 15–41)
Albumin: 3.3 g/dL — ABNORMAL LOW (ref 3.5–5.0)
Alkaline Phosphatase: 99 U/L (ref 38–126)
Anion gap: 11 (ref 5–15)
BUN: 14 mg/dL (ref 8–23)
CO2: 23 mmol/L (ref 22–32)
Calcium: 8.7 mg/dL — ABNORMAL LOW (ref 8.9–10.3)
Chloride: 100 mmol/L (ref 98–111)
Creatinine, Ser: 0.68 mg/dL (ref 0.44–1.00)
GFR, Estimated: >60 mL/min (ref >60)
Glucose, Bld: 179 mg/dL — ABNORMAL HIGH (ref 70–99)
Potassium: 3.1 mmol/L — ABNORMAL LOW (ref 3.5–5.1)
Sodium: 134 mmol/L — ABNORMAL LOW (ref 135–145)
Total Bilirubin: 0.3 mg/dL (ref 0.3–1.2)
Total Protein: 7.4 g/dL (ref 6.5–8.1)

## 2021-02-03 LAB — CBC WITH DIFFERENTIAL/PLATELET
Abs Immature Granulocytes: 0.02 10*3/uL (ref 0.00–0.07)
Basophils Absolute: 0 10*3/uL (ref 0.0–0.1)
Basophils Relative: 1 %
Eosinophils Absolute: 0 10*3/uL (ref 0.0–0.5)
Eosinophils Relative: 1 %
HCT: 26.2 % — ABNORMAL LOW (ref 36.0–46.0)
Hemoglobin: 8.1 g/dL — ABNORMAL LOW (ref 12.0–15.0)
Immature Granulocytes: 1 %
Lymphocytes Relative: 41 %
Lymphs Abs: 1 10*3/uL (ref 0.7–4.0)
MCH: 25.7 pg — ABNORMAL LOW (ref 26.0–34.0)
MCHC: 30.9 g/dL (ref 30.0–36.0)
MCV: 83.2 fL (ref 80.0–100.0)
Monocytes Absolute: 0.4 10*3/uL (ref 0.1–1.0)
Monocytes Relative: 16 %
Neutro Abs: 1 10*3/uL — ABNORMAL LOW (ref 1.7–7.7)
Neutrophils Relative %: 40 %
Platelets: 237 10*3/uL (ref 150–400)
RBC: 3.15 MIL/uL — ABNORMAL LOW (ref 3.87–5.11)
RDW: 19.1 % — ABNORMAL HIGH (ref 11.5–15.5)
WBC: 2.4 10*3/uL — ABNORMAL LOW (ref 4.0–10.5)
nRBC: 1.3 % — ABNORMAL HIGH (ref 0.0–0.2)

## 2021-02-03 MED ORDER — SODIUM CHLORIDE 0.9 % IV SOLN
300.0000 mg | Freq: Once | INTRAVENOUS | Status: AC
  Administered 2021-02-03: 300 mg via INTRAVENOUS
  Filled 2021-02-03: qty 300

## 2021-02-03 MED ORDER — SODIUM CHLORIDE 0.9 % IV SOLN: Freq: Once | INTRAVENOUS | Status: AC

## 2021-02-03 MED ORDER — FILGRASTIM-SNDZ 480 MCG/0.8ML IJ SOSY
480.0000 ug | PREFILLED_SYRINGE | Freq: Once | INTRAMUSCULAR | Status: AC
  Administered 2021-02-03: 480 ug via SUBCUTANEOUS
  Filled 2021-02-03: qty 0.8

## 2021-02-03 MED ORDER — LIDOCAINE-PRILOCAINE 2.5-2.5 % EX CREA: 1.0000 "application " | TOPICAL_CREAM | CUTANEOUS | 3 refills | Status: DC | PRN

## 2021-02-03 MED ORDER — POTASSIUM CHLORIDE CRYS ER 20 MEQ PO TBCR
20.0000 meq | EXTENDED_RELEASE_TABLET | Freq: Once | ORAL | Status: AC
  Administered 2021-02-03: 20 meq via ORAL
  Filled 2021-02-03: qty 1

## 2021-02-03 MED ORDER — FOLIC ACID 1 MG PO TABS: 1.0000 mg | ORAL_TABLET | Freq: Every day | ORAL | 3 refills | Status: DC

## 2021-02-03 MED ORDER — SODIUM CHLORIDE 0.9% FLUSH
10.0000 mL | Freq: Once | INTRAVENOUS | Status: AC
  Administered 2021-02-03: 10 mL via INTRAVENOUS

## 2021-02-03 MED ORDER — HEPARIN SOD (PORK) LOCK FLUSH 100 UNIT/ML IV SOLN
500.0000 [IU] | Freq: Once | INTRAVENOUS | Status: AC
  Administered 2021-02-03: 500 [IU] via INTRAVENOUS

## 2021-02-03 NOTE — Patient Instructions (Addendum)
[place] at [facility] Discharge Instructions  You were seen today by Dr. Delton Coombes. He went over your recent results. You have been prescribed Folic acid: take 1 a day. You did not receive treatment today due to your low white blood cell count. You will return tomorrow for treatment once your white blood cell count has improved. Dr. Delton Coombes will see you back in 2 weeks for labs and follow up.   Thank you for choosing [place] at [facility] to provide your oncology and hematology care.  To afford each patient quality time with our provider, please arrive at least 15 minutes before your scheduled appointment time.   If you have a lab appointment with the [facility] please come in thru the Main Entrance and check in at the main information desk  You need to re-schedule your appointment should you arrive 10 or more minutes late.  We strive to give you quality time with our providers, and arriving late affects you and other patients whose appointments are after yours.  Also, if you no show three or more times for appointments you may be dismissed from the clinic at the providers discretion.     Again, thank you for choosing [facility].  Our hope is that these requests will decrease the amount of time that you wait before being seen by our physicians.       _____________________________________________________________  Should you have questions after your visit to [facility], please contact our office at [phone] between the hours of 8:00 a.m. and 4:30 p.m.  Voicemails left after 4:00 p.m. will not be returned until the following business day.  For prescription refill requests, have your pharmacy contact our office and allow 72 hours.    Cancer Center Support Programs:   > Cancer Support Group  2nd Tuesday of the month 1pm-2pm, Journey Room

## 2021-02-04 ENCOUNTER — Inpatient Hospital Stay (HOSPITAL_COMMUNITY): Payer: Medicare HMO

## 2021-02-04 VITALS — BP 116/54 | HR 90 | Temp 98.7°F | Resp 18

## 2021-02-04 DIAGNOSIS — C9000 Multiple myeloma not having achieved remission: Secondary | ICD-10-CM

## 2021-02-04 DIAGNOSIS — C50812 Malignant neoplasm of overlapping sites of left female breast: Secondary | ICD-10-CM

## 2021-02-04 DIAGNOSIS — Z95828 Presence of other vascular implants and grafts: Secondary | ICD-10-CM

## 2021-02-04 DIAGNOSIS — Z5111 Encounter for antineoplastic chemotherapy: Secondary | ICD-10-CM | POA: Diagnosis not present

## 2021-02-04 DIAGNOSIS — Z170 Estrogen receptor positive status [ER+]: Secondary | ICD-10-CM

## 2021-02-04 LAB — CBC WITH DIFFERENTIAL/PLATELET
Abs Immature Granulocytes: 0.16 10*3/uL — ABNORMAL HIGH (ref 0.00–0.07)
Basophils Absolute: 0.1 10*3/uL (ref 0.0–0.1)
Basophils Relative: 1 %
Eosinophils Absolute: 0 10*3/uL (ref 0.0–0.5)
Eosinophils Relative: 0 %
HCT: 24.9 % — ABNORMAL LOW (ref 36.0–46.0)
Hemoglobin: 7.7 g/dL — ABNORMAL LOW (ref 12.0–15.0)
Immature Granulocytes: 1 %
Lymphocytes Relative: 13 %
Lymphs Abs: 1.5 10*3/uL (ref 0.7–4.0)
MCH: 26.3 pg (ref 26.0–34.0)
MCHC: 30.9 g/dL (ref 30.0–36.0)
MCV: 85 fL (ref 80.0–100.0)
Monocytes Absolute: 1.4 10*3/uL — ABNORMAL HIGH (ref 0.1–1.0)
Monocytes Relative: 12 %
Neutro Abs: 8.4 10*3/uL — ABNORMAL HIGH (ref 1.7–7.7)
Neutrophils Relative %: 73 %
Platelets: 238 10*3/uL (ref 150–400)
RBC: 2.93 MIL/uL — ABNORMAL LOW (ref 3.87–5.11)
RDW: 19.9 % — ABNORMAL HIGH (ref 11.5–15.5)
WBC: 11.6 10*3/uL — ABNORMAL HIGH (ref 4.0–10.5)
nRBC: 1.1 % — ABNORMAL HIGH (ref 0.0–0.2)

## 2021-02-04 LAB — PREPARE RBC (CROSSMATCH)

## 2021-02-04 MED ORDER — ACETAMINOPHEN 325 MG PO TABS
650.0000 mg | ORAL_TABLET | Freq: Once | ORAL | Status: AC
  Administered 2021-02-04: 650 mg via ORAL
  Filled 2021-02-04: qty 2

## 2021-02-04 MED ORDER — PACLITAXEL CHEMO INJECTION 300 MG/50ML
64.0000 mg/m2 | Freq: Once | INTRAVENOUS | Status: AC
  Administered 2021-02-04: 150 mg via INTRAVENOUS
  Filled 2021-02-04: qty 25

## 2021-02-04 MED ORDER — SODIUM CHLORIDE 0.9% FLUSH
10.0000 mL | INTRAVENOUS | Status: DC | PRN
  Administered 2021-02-04: 10 mL

## 2021-02-04 MED ORDER — SODIUM CHLORIDE 0.9 % IV SOLN
10.0000 mg | Freq: Once | INTRAVENOUS | Status: AC
  Administered 2021-02-04: 10 mg via INTRAVENOUS
  Filled 2021-02-04: qty 10

## 2021-02-04 MED ORDER — SODIUM CHLORIDE 0.9 % IV SOLN
Freq: Once | INTRAVENOUS | Status: AC
Start: 2021-02-04 — End: 2021-02-04

## 2021-02-04 MED ORDER — FAMOTIDINE 20 MG IN NS 100 ML IVPB
20.0000 mg | Freq: Once | INTRAVENOUS | Status: AC
  Administered 2021-02-04: 20 mg via INTRAVENOUS
  Filled 2021-02-04: qty 100

## 2021-02-04 MED ORDER — PALONOSETRON HCL INJECTION 0.25 MG/5ML
0.2500 mg | Freq: Once | INTRAVENOUS | Status: AC
  Administered 2021-02-04: 0.25 mg via INTRAVENOUS
  Filled 2021-02-04: qty 5

## 2021-02-04 MED ORDER — HEPARIN SOD (PORK) LOCK FLUSH 100 UNIT/ML IV SOLN
500.0000 [IU] | Freq: Once | INTRAVENOUS | Status: AC | PRN
  Administered 2021-02-04: 500 [IU]

## 2021-02-04 MED ORDER — SODIUM CHLORIDE 0.9 % IV SOLN
200.0000 mg | Freq: Once | INTRAVENOUS | Status: AC
  Administered 2021-02-04: 200 mg via INTRAVENOUS
  Filled 2021-02-04: qty 20

## 2021-02-04 MED ORDER — STERILE WATER FOR IRRIGATION IR SOLN: Freq: Once | Status: DC

## 2021-02-04 MED ORDER — DIPHENHYDRAMINE HCL 25 MG PO CAPS: 25.0000 mg | ORAL_CAPSULE | Freq: Once | ORAL | Status: DC

## 2021-02-04 MED ORDER — SODIUM CHLORIDE 0.9% IV SOLUTION
250.0000 mL | Freq: Once | INTRAVENOUS | Status: AC
  Administered 2021-02-04: 250 mL via INTRAVENOUS

## 2021-02-04 MED ORDER — STERILE WATER FOR INJECTION IJ SOLN
10.0000 mL | Freq: Once | INTRAMUSCULAR | Status: DC
  Filled 2021-02-04: qty 10

## 2021-02-04 MED ORDER — DIPHENHYDRAMINE HCL 50 MG/ML IJ SOLN
50.0000 mg | Freq: Once | INTRAMUSCULAR | Status: AC
  Administered 2021-02-04: 50 mg via INTRAVENOUS
  Filled 2021-02-04: qty 1

## 2021-02-04 MED ORDER — ALTEPLASE 2 MG IJ SOLR
2.0000 mg | Freq: Once | INTRAMUSCULAR | Status: AC
  Administered 2021-02-04: 2 mg
  Filled 2021-02-04: qty 2

## 2021-02-04 NOTE — Progress Notes (Signed)
No blood return from port today. Will administer alteplase per protocol.  0940- administered  alteplase  1010-10 mls of blood return noted.   Waiting on labs for possible chemotherapy infusion today.   Labs reviewed today with MD. Will proceed with chemotherapy and give one unit of blood today per MD.

## 2021-02-04 NOTE — Progress Notes (Signed)
Patient tolerated treatment well with no complaints voiced.  Patient left via wheelchair in stable condition.  Vital signs stable at discharge.  Follow up as scheduled.    

## 2021-02-04 NOTE — Patient Instructions (Signed)
[facility]  Discharge Instructions: Thank you for choosing [facility] to provide your oncology and hematology care.  If you have a lab appointment with the Cancer Center, please come in thru the Main Entrance and check in at the main information desk.  Wear comfortable clothing and clothing appropriate for easy access to any Portacath or PICC line.   We strive to give you quality time with your provider. You may need to reschedule your appointment if you arrive late (15 or more minutes).  Arriving late affects you and other patients whose appointments are after yours.  Also, if you miss three or more appointments without notifying the office, you may be dismissed from the clinic at the provider's discretion.      For prescription refill requests, have your pharmacy contact our office and allow 72 hours for refills to be completed.        To help prevent nausea and vomiting after your treatment, we encourage you to take your nausea medication as directed.  BELOW ARE SYMPTOMS THAT SHOULD BE REPORTED IMMEDIATELY: *FEVER GREATER THAN 100.4 F (38 C) OR HIGHER *CHILLS OR SWEATING *NAUSEA AND VOMITING THAT IS NOT CONTROLLED WITH YOUR NAUSEA MEDICATION *UNUSUAL SHORTNESS OF BREATH *UNUSUAL BRUISING OR BLEEDING *URINARY PROBLEMS (pain or burning when urinating, or frequent urination) *BOWEL PROBLEMS (unusual diarrhea, constipation, pain near the anus) TENDERNESS IN MOUTH AND THROAT WITH OR WITHOUT PRESENCE OF ULCERS (sore throat, sores in mouth, or a toothache) UNUSUAL RASH, SWELLING OR PAIN  UNUSUAL VAGINAL DISCHARGE OR ITCHING   Items with * indicate a potential emergency and should be followed up as soon as possible or go to the Emergency Department if any problems should occur.  Please show the CHEMOTHERAPY ALERT CARD or IMMUNOTHERAPY ALERT CARD at check-in to the Emergency Department and triage nurse.  Should you have questions after your visit or need to cancel  or reschedule your appointment, please contact [facility] [phone]  and follow the prompts.  Office hours are 8:00 a.m. to 4:30 p.m. Monday - Friday. Please note that voicemails left after 4:00 p.m. may not be returned until the following business day.  We are closed weekends and major holidays. You have access to a nurse at all times for urgent questions. Please call the main number to the clinic [phone] and follow the prompts.  For any non-urgent questions, you may also contact your provider using MyChart. We now offer e-Visits for anyone 18 and older to request care online for non-urgent symptoms. For details visit [email or web address].   Also download the MyChart app! Go to the app store, search "MyChart", open the app, select [facility], and log in with your MyChart username and password.  Due to Covid, a mask is required upon entering the hospital/clinic. If you do not have a mask, one will be given to you upon arrival. For doctor visits, patients may have 1 support person aged 18 or older with them. For treatment visits, patients cannot have anyone with them due to current Covid guidelines and our immunocompromised population.  

## 2021-02-05 LAB — TYPE AND SCREEN
ABO/RH(D): O POS
Antibody Screen: NEGATIVE
Unit division: 0

## 2021-02-05 LAB — BPAM RBC
Blood Product Expiration Date: 202210062359
ISSUE DATE / TIME: 202209071420
Unit Type and Rh: 5100

## 2021-02-07 ENCOUNTER — Other Ambulatory Visit (HOSPITAL_COMMUNITY): Payer: Self-pay | Admitting: Hematology

## 2021-02-09 ENCOUNTER — Encounter (HOSPITAL_COMMUNITY): Payer: Self-pay | Admitting: Hematology

## 2021-02-10 ENCOUNTER — Encounter (HOSPITAL_COMMUNITY): Payer: Self-pay

## 2021-02-16 NOTE — Progress Notes (Signed)
[address], [place] [address]   CLINIC:  Medical Oncology/Hematology  PCP:  Lemmie Evens, MD [place]. / [place] [address] [phone]   REASON FOR VISIT:  Follow-up for  left breast cancer  PRIOR THERAPY: 1. Paclitaxel x 6 cycles from 12/31/2019 to 06/02/2020. 2. Xeloda through 09/17/2020.  NGS Results: ER/HER-2 positive, PR negative, Ki-67 30%  CURRENT THERAPY: Carboplatin & Taxol D1,8,15 every 4 weeks x 6 cycles  BRIEF ONCOLOGIC HISTORY:  Oncology History  Malignant neoplasm of overlapping sites of left breast in female, estrogen receptor positive ([facility])  11/16/2019 Cancer Staging   Staging form: Breast, AJCC 8th Edition - Clinical stage from 11/16/2019: Stage IV (cT4b, cN1, cM1, G3, ER+, PR-, HER2-) - Signed by [name], MD on 12/17/2019   11/28/2019 Initial Diagnosis   Patient noted intermittent left breast pain and swelling for several months. Mammogram showed a 6.2cm mass in the left breast axillary tail with an adjacent 0.9cm mass, 2 abnormal left axillary lymph nodes, 1 abnormal right axillary lymph node, and diffuse left breast edema and skin thickening. Biopsy showed IDC, grade 3 in the left breast and bilateral axillas, HER-2 negative (1+), ER+ 100%, PR- 0%, Ki67 30%.    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, a 70 y.o. female, returns for routine follow-up and consideration for next cycle of chemotherapy. Phyllicia was last seen on 02/03/2021.  Due for cycle #2 of carboplatin + taxol  today.   Overall, she tells me she has been feeling pretty well. She denies any recent hematuria. Her energy levels have improved, and she has not required any hydromorphone over the past 2 weeks. She reports occasional numbness in her toes, and she denies any associated burning pain. She reports chills for several hours following her last blood transfusion. She reports occasional nausea and denies vomiting and diarrhea. Her appetite is good. She reports coughing and wheezing when lying down at night and in the morning; she is currently taking over the counter cough syrup.   Overall, she feels ready for next cycle of chemo today.   REVIEW OF SYSTEMS:  Review of Systems  Constitutional:  Positive for chills. Negative for appetite change (75%) and fatigue (25% improved).  Respiratory:  Positive for cough and wheezing.   Gastrointestinal:  Positive for nausea. Negative for diarrhea and vomiting.  Genitourinary:  Negative for hematuria.   Neurological:  Positive for numbness (feet).  All other systems reviewed and are negative.  PAST MEDICAL/SURGICAL HISTORY:  Past Medical History:  Diagnosis Date   Anemia    Arthritis    per patient "  left knee"   DVT (deep venous thrombosis) (H[facility])    left leg   Dyspnea    Essential hypertension, benign    Family history of cancer of female genital organ    Family history of GI tract cancer    Metastatic breast cancer ([facility])    left breast   Port-A-Cath in place 12/25/2019   Type 2 diabetes mellitus [facility])    Past Surgical History:  Procedure Laterality Date   BIOPSY  11/27/2020   Procedure: BIOPSY;  Surgeon: Daneil Dolin, MD;  Location: [facility];  Service: Endoscopy;;   CATARACT EXTRACTION W/ INTRAOCULAR LENS IMPLANT Right    COLONOSCOPY WITH PROPOFOL N/A 11/27/2020   Procedure: COLONOSCOPY WITH PROPOFOL;  Surgeon: Daneil Dolin, MD;  Location: [facility];  Service: Endoscopy;  Laterality: N/A;  9:00am   HYSTEROSCOPY WITH D & C N/A 06/05/2014   Procedure: DILATATION AND CURETTAGE /HYSTEROSCOPY;  Surgeon: Florian Buff, MD;  Location: [facility] ORS;  Service: Gynecology;  Laterality: N/A;   POLYPECTOMY N/A 06/05/2014   Procedure: ENDOMETRIAL POLYPECTOMY;  Surgeon: Florian Buff, MD;  Location: [facility] ORS;  Service: Gynecology;  Laterality: N/A;   PORTACATH PLACEMENT N/A 12/14/2019   Procedure: INSERTION PORT-A-CATH WITH ULTRASOUND GUIDANCE;  Surgeon: Donnie Mesa, MD;  Location: [facility];  Service: General;  Laterality: N/A;   TUBAL LIGATION      SOCIAL HISTORY:  Social History   Socioeconomic History   Marital status: Married    Spouse name: Herbie Baltimore   Number of children: 2   Years of education: Not on file   Highest education level: Not on file  Occupational History   Occupation: retired  Tobacco Use   Smoking status: Former    Types: Cigarettes   Smokeless tobacco: Never   Tobacco comments:    quit about 40+ years ago  Vaping Use   Vaping Use: Never used  Substance and Sexual Activity   Alcohol use: Never   Drug use: No   Sexual activity: Not on file  Other Topics Concern   Not on file  Social History Narrative   Not on file   Social Determinants of Health    Financial Resource Strain: Not on file  Food Insecurity: Not on file  Transportation Needs: Not on file  Physical Activity: Not on file  Stress: Not on file  Social Connections: Not on file  Intimate Partner Violence: Not on file    FAMILY HISTORY:  Family History  Problem Relation Age of Onset   Diabetes Mellitus II Father    Congestive Heart Failure Father    Colon cancer Mother 18       patient not sure if colon vs stomach   Stroke Maternal Grandmother    Cancer Cousin        female reproductive cancer, dx. 50s/60s    CURRENT MEDICATIONS:  Current Outpatient Medications  Medication Sig Dispense Refill   apixaban (ELIQUIS) 5 MG TABS tablet Take 1 tablet (5 mg total) by mouth 2 (two) times daily.  60 tablet 3   chlorpheniramine-HYDROcodone (TUSSIONEX) 10-8 MG/5ML SUER SMARTSIG:1 Teaspoon By Mouth Twice Daily (Patient taking differently: Take 5 mLs by mouth daily as needed for cough.) 480 mL 0   ferrous sulfate 325 (65 FE) MG tablet Take 325 mg by mouth daily with breakfast.     folic acid (FOLVITE) 1 MG tablet TAKE 1 TABLET BY MOUTH EVERY DAY 90 tablet 1   hydrochlorothiazide (HYDRODIURIL) 25 MG tablet Take 25 mg by mouth daily.     HYDROcodone-acetaminophen (NORCO/VICODIN) 5-325 MG tablet Take 1 tablet by mouth every 6 (six) hours as needed for moderate pain. 15 tablet 0   HYDROmorphone (DILAUDID) 2 MG tablet Take 1 tablet (2 mg total) by mouth every 4 (four) hours as needed for severe pain. 90 tablet 0   ibuprofen (ADVIL) 800 MG tablet Take 800 mg by mouth 3 (three) times daily.     Ketotifen Fumarate (ITCHY EYE DROPS OP) Place 1 drop into both eyes daily as needed (Itching eyes).     KLOR-CON M10 10 MEQ tablet Take 10 mEq by mouth daily.     lidocaine-prilocaine (EMLA) cream Apply 1 application topically as needed. 30 g 3   losartan (COZAAR) 50 MG tablet Take 50 mg by mouth daily.     metFORMIN (GLUCOPHAGE) 500 MG tablet Take 500 mg by mouth 2 (two) times daily with a meal.      OVER THE COUNTER MEDICATION Apply 1 application topically daily as needed (Knee pain). Absorbine Jr. Roll on     pravastatin (PRAVACHOL) 10 MG tablet Take 10 mg by mouth daily.      prochlorperazine (COMPAZINE) 10 MG tablet Take 1 tablet (10 mg total) by mouth every 6 (six) hours as needed (Nausea or vomiting). 60 tablet 3   No current facility-administered medications for this visit.    ALLERGIES:  No Known Allergies  PHYSICAL EXAM:  Performance status (ECOG): 1 - Symptomatic but completely ambulatory  There were no vitals filed for this visit. Wt Readings from Last 3 Encounters:  02/03/21 262 lb 6.4 oz (119 kg)  01/26/21 265 lb 6.4 oz (120.4 kg)  01/19/21 272 lb 9.6 oz (123.7 kg)   Physical Exam Vitals reviewed.  Constitutional:      Appearance: Normal appearance.  Cardiovascular:     Rate and Rhythm: Normal rate and regular rhythm.     Pulses: Normal pulses.     Heart sounds: Normal heart sounds.  Pulmonary:     Effort: Pulmonary effort is normal.     Breath sounds: Normal breath sounds.  Neurological:     General: No focal deficit present.     Mental Status: She is alert and oriented to person, place, and time.  Psychiatric:        Mood and Affect: Mood normal.        Behavior: Behavior normal.    LABORATORY DATA:  I have reviewed the labs as listed.  CBC Latest Ref Rng & Units 02/04/2021 02/03/2021 01/26/2021  WBC 4.0 - 10.5 K/uL 11.6(H) 2.4(L) 4.5  Hemoglobin 12.0 - 15.0 g/dL 7.7(L) 8.1(L) 8.1(L)  Hematocrit 36.0 - 46.0 % 24.9(L) 26.2(L) 26.4(L)  Platelets 150 - 400 K/uL 238 237 262   CMP Latest Ref Rng & Units 02/03/2021 01/26/2021 01/19/2021  Glucose 70 - 99 mg/dL 179(H) 164(H) 149(H)  BUN 8 - 23 mg/dL _0 Creatinine 0.44 - 1.00 mg/dL 0.68 0.69 0.78  Sodium 135 - 145 mmol/L 134(L) 134(L) 134(L)  Potassium 3.5 -  5.1 mmol/L 3.1(L) 4.1 3.6  Chloride 98 - 111 mmol/L 100 102 103  CO2 22 - 32 mmol/L _0 Calcium 8.9 - 10.3 mg/dL 8.7(L) 8.7(L) 8.3(L)   Total Protein 6.5 - 8.1 g/dL 7.4 7.3 6.8  Total Bilirubin 0.3 - 1.2 mg/dL 0.3 0.2(L) 0.4  Alkaline Phos 38 - 126 U/L 99 112 98  AST 15 - 41 U/L 36 39 42(H)  ALT 0 - 44 U/L _1 DIAGNOSTIC IMAGING:  I have independently reviewed the scans and discussed with the patient. No results found.   ASSESSMENT:  1.  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  Right breast upper outer quadrant biopsy was also positive for malignancy. -MRI of the breast on 12/10/2019 shows left breast malignancy involving all 4 quadrants and skin of the left breast including 0.5 cm biopsy-proven malignancy within the far posterior outer right breast, abnormal highly suspicious nonmuscle like enhancement within all 4 quadrants of the left breast and diffuse skin thickening.  This abnormal non-mass-like enhancement and skin thickening extends across the midline to the  medial right breast.  At least 7 abnormal left axillary lymph nodes and 1 abnormal right axillary lymph node compatible with metastatic disease. -CT CAP on 12/12/2019 shows bulky lymphadenopathy in the left subpectoral and axillary regions with largest lymph node measuring 3.3 cm.  Mild mediastinal adenopathy in the right paratracheal prevascular and lateral aortic regions, largest lymph node in the lateral aortic region measuring 2.4 cm.  Lymphadenopathy in the inferior jugular and supraclavicular regions, left side greater than right.  Mild lymphadenopathy in the right axilla. -Multiple small hypervascular lesions seen in the right and left lobes consistent with liver metastasis. -Bone scan on 12/12/2019 did not show any bone metastasis. -CT CAP on 12/29/2019 showed possible lytic lesion in the T4 vertebral body. -Liver biopsy on 12/28/2019 consistent with metastatic breast cancer. -Paclitaxel weekly 3 weeks on 1 week off  started on 12/31/2019. -CT CAP on 03/20/2020 shows mild decrease in the left axillary, subpectoral, supraclavicular, mediastinal and lower jugular lymphadenopathy.  Stable small low-attenuation liver lesion.  No new progressive metastatic disease.  Stable lesion in the left sternal manubrium and T4 vertebral body.  Stable short segment concentric wall thickening of the right colon with subcentimeter pericolonic lymph nodes.  We will monitor this finding as she has metastatic breast cancer.  She is asymptomatic. -CT CAP on 06/18/1998 6:22 cycles of chemotherapy showed no new or progressive metastatic disease. Similar size of the index and nonindex left axillary subpectoral, supraclavicular and lower jugular lymphadenopathy. - 3 cycles of Doxil from 10/02/2020 through 12/02/2020.   2.  Right colonic mass: -CT scan showed constricting mass involving the ascending colon measuring 3.6 x 3.5 cm with a small 1 cm right pericolonic lymph node. -She never had a colonoscopy.  Denies any bleeding per rectum or melena.   3.  Left leg DVT: -Doppler on 03/03/2020 shows left femoral vein, popliteal vein and posterior tibial vein deep vein thrombosis.   PLAN:  1.  Metastatic left breast cancer to the liver: - She has completed first cycle of carboplatin and paclitaxel. - She felt better in terms of tiredness after blood transfusion and dose reduction of carboplatin and paclitaxel. - Reviewed labs today which showed normal LFTs.  CBC shows hemoglobin is 9.6.  White count is 3.6 with  ANC of 1.2. - We will proceed with cycle 2-day 1 with carboplatin flat dose 200 mg and Taxol 64 mg/m2. - We will check her CBC 1 day prior to her treatment to see if she needs G-CSF. - RTC 4 weeks with repeat labs and cycle 3.   2.  Right colonic adenocarcinoma: - Most recent CT scan showed slight worsening of right colon mass and adenopathy.  We are holding off on surgical management of colon cancer because of her advanced breast  cancer.   3. Peripheral neuropathy: - She has numbness in the feet which is stable.  No neuropathic pains.  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   4.  Left leg DVT: - Continue Eliquis twice daily.   5.  Postmenopausal bleeding: - She reports that she has not seen blood in the urine for the last 1 week.  She is yet to be evaluated by GYN.  6.  Left arm pain: - She does not report any left arm pain at this time.  She did not require pain medication in the last 2 to 3 weeks.   Orders placed this encounter:  No orders of the defined types were placed in this encounter.    [name], MD [place] [phone]   I, Thana Ates, am acting as a scribe for Dr. [name].  I, [name] MD, have reviewed the above documentation for accuracy and completeness, and I agree with the above.

## 2021-02-17 ENCOUNTER — Inpatient Hospital Stay (HOSPITAL_COMMUNITY): Payer: Medicare HMO

## 2021-02-17 ENCOUNTER — Other Ambulatory Visit: Payer: Self-pay

## 2021-02-17 ENCOUNTER — Inpatient Hospital Stay (HOSPITAL_BASED_OUTPATIENT_CLINIC_OR_DEPARTMENT_OTHER): Payer: Medicare HMO | Admitting: Hematology

## 2021-02-17 VITALS — BP 112/76 | HR 90 | Temp 96.7°F | Resp 17

## 2021-02-17 DIAGNOSIS — C50812 Malignant neoplasm of overlapping sites of left female breast: Secondary | ICD-10-CM

## 2021-02-17 DIAGNOSIS — C9000 Multiple myeloma not having achieved remission: Secondary | ICD-10-CM | POA: Diagnosis not present

## 2021-02-17 DIAGNOSIS — Z5111 Encounter for antineoplastic chemotherapy: Secondary | ICD-10-CM | POA: Diagnosis not present

## 2021-02-17 DIAGNOSIS — Z170 Estrogen receptor positive status [ER+]: Secondary | ICD-10-CM

## 2021-02-17 DIAGNOSIS — Z95828 Presence of other vascular implants and grafts: Secondary | ICD-10-CM

## 2021-02-17 LAB — COMPREHENSIVE METABOLIC PANEL
ALT: 16 U/L (ref 0–44)
AST: 38 U/L (ref 15–41)
Albumin: 2.9 g/dL — ABNORMAL LOW (ref 3.5–5.0)
Alkaline Phosphatase: 106 U/L (ref 38–126)
Anion gap: 8 (ref 5–15)
BUN: 14 mg/dL (ref 8–23)
CO2: 26 mmol/L (ref 22–32)
Calcium: 8.1 mg/dL — ABNORMAL LOW (ref 8.9–10.3)
Chloride: 99 mmol/L (ref 98–111)
Creatinine, Ser: 0.77 mg/dL (ref 0.44–1.00)
GFR, Estimated: >60 mL/min (ref >60)
Glucose, Bld: 148 mg/dL — ABNORMAL HIGH (ref 70–99)
Potassium: 3.5 mmol/L (ref 3.5–5.1)
Sodium: 133 mmol/L — ABNORMAL LOW (ref 135–145)
Total Bilirubin: 0.5 mg/dL (ref 0.3–1.2)
Total Protein: 7.2 g/dL (ref 6.5–8.1)

## 2021-02-17 LAB — CBC WITH DIFFERENTIAL/PLATELET
Abs Immature Granulocytes: 0.07 10*3/uL (ref 0.00–0.07)
Basophils Absolute: 0 10*3/uL (ref 0.0–0.1)
Basophils Relative: 1 %
Eosinophils Absolute: 0 10*3/uL (ref 0.0–0.5)
Eosinophils Relative: 1 %
HCT: 31.4 % — ABNORMAL LOW (ref 36.0–46.0)
Hemoglobin: 9.6 g/dL — ABNORMAL LOW (ref 12.0–15.0)
Immature Granulocytes: 2 %
Lymphocytes Relative: 37 %
Lymphs Abs: 1.4 10*3/uL (ref 0.7–4.0)
MCH: 26 pg (ref 26.0–34.0)
MCHC: 30.6 g/dL (ref 30.0–36.0)
MCV: 85.1 fL (ref 80.0–100.0)
Monocytes Absolute: 1 10*3/uL (ref 0.1–1.0)
Monocytes Relative: 26 %
Neutro Abs: 1.2 10*3/uL — ABNORMAL LOW (ref 1.7–7.7)
Neutrophils Relative %: 33 %
Platelets: 168 10*3/uL (ref 150–400)
RBC: 3.69 MIL/uL — ABNORMAL LOW (ref 3.87–5.11)
RDW: 22.3 % — ABNORMAL HIGH (ref 11.5–15.5)
WBC: 3.6 10*3/uL — ABNORMAL LOW (ref 4.0–10.5)
nRBC: 0 % (ref 0.0–0.2)

## 2021-02-17 MED ORDER — SODIUM CHLORIDE 0.9 % IV SOLN
Freq: Once | INTRAVENOUS | Status: AC
Start: 2021-02-17 — End: 2021-02-17

## 2021-02-17 MED ORDER — SODIUM CHLORIDE 0.9% FLUSH
10.0000 mL | INTRAVENOUS | Status: DC | PRN
  Administered 2021-02-17: 10 mL

## 2021-02-17 MED ORDER — SODIUM CHLORIDE 0.9 % IV SOLN
64.0000 mg/m2 | Freq: Once | INTRAVENOUS | Status: AC
  Administered 2021-02-17: 150 mg via INTRAVENOUS
  Filled 2021-02-17: qty 25

## 2021-02-17 MED ORDER — SODIUM CHLORIDE 0.9 % IV SOLN
10.0000 mg | Freq: Once | INTRAVENOUS | Status: AC
  Administered 2021-02-17: 10 mg via INTRAVENOUS
  Filled 2021-02-17: qty 10

## 2021-02-17 MED ORDER — DIPHENHYDRAMINE HCL 50 MG/ML IJ SOLN
50.0000 mg | Freq: Once | INTRAMUSCULAR | Status: AC
  Administered 2021-02-17: 50 mg via INTRAVENOUS
  Filled 2021-02-17: qty 1

## 2021-02-17 MED ORDER — FAMOTIDINE 20 MG IN NS 100 ML IVPB
20.0000 mg | Freq: Once | INTRAVENOUS | Status: AC
  Administered 2021-02-17: 20 mg via INTRAVENOUS
  Filled 2021-02-17: qty 20

## 2021-02-17 MED ORDER — HEPARIN SOD (PORK) LOCK FLUSH 100 UNIT/ML IV SOLN
500.0000 [IU] | Freq: Once | INTRAVENOUS | Status: AC | PRN
  Administered 2021-02-17: 500 [IU]

## 2021-02-17 MED ORDER — SODIUM CHLORIDE 0.9 % IV SOLN
200.0000 mg | Freq: Once | INTRAVENOUS | Status: AC
  Administered 2021-02-17: 200 mg via INTRAVENOUS
  Filled 2021-02-17: qty 20

## 2021-02-17 MED ORDER — PALONOSETRON HCL INJECTION 0.25 MG/5ML
0.2500 mg | Freq: Once | INTRAVENOUS | Status: AC
  Administered 2021-02-17: 0.25 mg via INTRAVENOUS
  Filled 2021-02-17: qty 5

## 2021-02-17 NOTE — Patient Instructions (Signed)
[facility]  Discharge Instructions: Thank you for choosing [facility] to provide your oncology and hematology care.  If you have a lab appointment with the Cancer Center, please come in thru the Main Entrance and check in at the main information desk.  Wear comfortable clothing and clothing appropriate for easy access to any Portacath or PICC line.   We strive to give you quality time with your provider. You may need to reschedule your appointment if you arrive late (15 or more minutes).  Arriving late affects you and other patients whose appointments are after yours.  Also, if you miss three or more appointments without notifying the office, you may be dismissed from the clinic at the provider's discretion.      For prescription refill requests, have your pharmacy contact our office and allow 72 hours for refills to be completed.        To help prevent nausea and vomiting after your treatment, we encourage you to take your nausea medication as directed.  BELOW ARE SYMPTOMS THAT SHOULD BE REPORTED IMMEDIATELY: *FEVER GREATER THAN 100.4 F (38 C) OR HIGHER *CHILLS OR SWEATING *NAUSEA AND VOMITING THAT IS NOT CONTROLLED WITH YOUR NAUSEA MEDICATION *UNUSUAL SHORTNESS OF BREATH *UNUSUAL BRUISING OR BLEEDING *URINARY PROBLEMS (pain or burning when urinating, or frequent urination) *BOWEL PROBLEMS (unusual diarrhea, constipation, pain near the anus) TENDERNESS IN MOUTH AND THROAT WITH OR WITHOUT PRESENCE OF ULCERS (sore throat, sores in mouth, or a toothache) UNUSUAL RASH, SWELLING OR PAIN  UNUSUAL VAGINAL DISCHARGE OR ITCHING   Items with * indicate a potential emergency and should be followed up as soon as possible or go to the Emergency Department if any problems should occur.  Please show the CHEMOTHERAPY ALERT CARD or IMMUNOTHERAPY ALERT CARD at check-in to the Emergency Department and triage nurse.  Should you have questions after your visit or need to cancel  or reschedule your appointment, please contact [facility] [phone]  and follow the prompts.  Office hours are 8:00 a.m. to 4:30 p.m. Monday - Friday. Please note that voicemails left after 4:00 p.m. may not be returned until the following business day.  We are closed weekends and major holidays. You have access to a nurse at all times for urgent questions. Please call the main number to the clinic [phone] and follow the prompts.  For any non-urgent questions, you may also contact your provider using MyChart. We now offer e-Visits for anyone 18 and older to request care online for non-urgent symptoms. For details visit [email or web address].   Also download the MyChart app! Go to the app store, search "MyChart", open the app, select [facility], and log in with your MyChart username and password.  Due to Covid, a mask is required upon entering the hospital/clinic. If you do not have a mask, one will be given to you upon arrival. For doctor visits, patients may have 1 support person aged 18 or older with them. For treatment visits, patients cannot have anyone with them due to current Covid guidelines and our immunocompromised population.  

## 2021-02-17 NOTE — Progress Notes (Signed)
Patient has been examined, vital signs and labs have been reviewed by Dr. Katragadda. ANC, Creatinine, LFTs, hemoglobin, and platelets are within treatment parameters per Dr. Katragadda. Patient is okay to proceed with treatment per M.D.   

## 2021-02-17 NOTE — Patient Instructions (Addendum)
[facility] Discharge Instructions  You were seen today by Dr. Katragadda. He went over your recent results, and you received your treatment. Dr. Katragadda will see you back in 1 month for labs and follow up.   Thank you for choosing [facility] to provide your oncology and hematology care.  To afford each patient quality time with our provider, please arrive at least 15 minutes before your scheduled appointment time.   If you have a lab appointment with the Cancer Center please come in thru the Main Entrance and check in at the main information desk  You need to re-schedule your appointment should you arrive 10 or more minutes late.  We strive to give you quality time with our providers, and arriving late affects you and other patients whose appointments are after yours.  Also, if you no show three or more times for appointments you may be dismissed from the clinic at the providers discretion.     Again, thank you for choosing [facility].  Our hope is that these requests will decrease the amount of time that you wait before being seen by our physicians.       _____________________________________________________________  Should you have questions after your visit to [facility], please contact our office at [phone] between the hours of 8:00 a.m. and 4:30 p.m.  Voicemails left after 4:00 p.m. will not be returned until the following business day.  For prescription refill requests, have your pharmacy contact our office and allow 72 hours.    Cancer Center Support Programs:   > Cancer Support Group  2nd Tuesday of the month 1pm-2pm, Journey Room   

## 2021-02-17 NOTE — Progress Notes (Signed)
Port flushed with good blood return noted. No bruising or swelling at site. Awaiting labs for treatment.

## 2021-02-17 NOTE — Progress Notes (Signed)
Patient presents today for chemotherapy infusion.  Labs reviewed by Dr. Delton Coombes during her office visit.  ANC is out of treatment parameters, but we will proceed with treatment per MD orders.  Vital signs are stable.    Patient tolerated treatment well with no complaints voiced.  Patient left via wheelchair in stable condition.  Vital signs stable at discharge.  Follow up as scheduled.

## 2021-02-17 NOTE — Progress Notes (Signed)
Chaplain engaged in a follow-up visit with Crystal Vega.  She expressed that her arm is no longer hurting and that she is happy with the changes in her treatment that have allowed that.  She stated that she hasn't had any pain medicine for weeks.  She also expressed that she was feeling tired today and wanted to rest after taking some benadryl.  Chaplain offered support, presence, and listening.   Chaplain will follow-up.     02/17/21 1100  Clinical Encounter Type  Visited With Patient  Visit Type Follow-up;Spiritual support

## 2021-02-24 ENCOUNTER — Other Ambulatory Visit: Payer: Self-pay

## 2021-02-24 ENCOUNTER — Encounter (HOSPITAL_COMMUNITY): Payer: Self-pay

## 2021-02-24 ENCOUNTER — Inpatient Hospital Stay (HOSPITAL_COMMUNITY): Payer: Medicare HMO

## 2021-02-24 VITALS — BP 109/64 | HR 87 | Temp 97.5°F | Resp 18

## 2021-02-24 DIAGNOSIS — C9000 Multiple myeloma not having achieved remission: Secondary | ICD-10-CM

## 2021-02-24 DIAGNOSIS — C50812 Malignant neoplasm of overlapping sites of left female breast: Secondary | ICD-10-CM

## 2021-02-24 DIAGNOSIS — Z170 Estrogen receptor positive status [ER+]: Secondary | ICD-10-CM

## 2021-02-24 DIAGNOSIS — Z5111 Encounter for antineoplastic chemotherapy: Secondary | ICD-10-CM | POA: Diagnosis not present

## 2021-02-24 DIAGNOSIS — Z95828 Presence of other vascular implants and grafts: Secondary | ICD-10-CM

## 2021-02-24 LAB — CBC WITH DIFFERENTIAL/PLATELET
Abs Immature Granulocytes: 0.03 10*3/uL (ref 0.00–0.07)
Basophils Absolute: 0.1 10*3/uL (ref 0.0–0.1)
Basophils Relative: 1 %
Eosinophils Absolute: 0 10*3/uL (ref 0.0–0.5)
Eosinophils Relative: 1 %
HCT: 28.8 % — ABNORMAL LOW (ref 36.0–46.0)
Hemoglobin: 9 g/dL — ABNORMAL LOW (ref 12.0–15.0)
Immature Granulocytes: 1 %
Lymphocytes Relative: 28 %
Lymphs Abs: 1 10*3/uL (ref 0.7–4.0)
MCH: 26.5 pg (ref 26.0–34.0)
MCHC: 31.3 g/dL (ref 30.0–36.0)
MCV: 84.7 fL (ref 80.0–100.0)
Monocytes Absolute: 0.2 10*3/uL (ref 0.1–1.0)
Monocytes Relative: 5 %
Neutro Abs: 2.4 10*3/uL (ref 1.7–7.7)
Neutrophils Relative %: 64 %
Platelets: 126 10*3/uL — ABNORMAL LOW (ref 150–400)
RBC: 3.4 MIL/uL — ABNORMAL LOW (ref 3.87–5.11)
RDW: 21.3 % — ABNORMAL HIGH (ref 11.5–15.5)
WBC: 3.7 10*3/uL — ABNORMAL LOW (ref 4.0–10.5)
nRBC: 0 % (ref 0.0–0.2)

## 2021-02-24 LAB — COMPREHENSIVE METABOLIC PANEL
ALT: 22 U/L (ref 0–44)
AST: 43 U/L — ABNORMAL HIGH (ref 15–41)
Albumin: 3.1 g/dL — ABNORMAL LOW (ref 3.5–5.0)
Alkaline Phosphatase: 100 U/L (ref 38–126)
Anion gap: 7 (ref 5–15)
BUN: 20 mg/dL (ref 8–23)
CO2: 25 mmol/L (ref 22–32)
Calcium: 8.4 mg/dL — ABNORMAL LOW (ref 8.9–10.3)
Chloride: 103 mmol/L (ref 98–111)
Creatinine, Ser: 0.65 mg/dL (ref 0.44–1.00)
GFR, Estimated: >60 mL/min (ref >60)
Glucose, Bld: 138 mg/dL — ABNORMAL HIGH (ref 70–99)
Potassium: 3.9 mmol/L (ref 3.5–5.1)
Sodium: 135 mmol/L (ref 135–145)
Total Bilirubin: 0.4 mg/dL (ref 0.3–1.2)
Total Protein: 7.1 g/dL (ref 6.5–8.1)

## 2021-02-24 MED ORDER — SODIUM CHLORIDE 0.9 % IV SOLN
64.0000 mg/m2 | Freq: Once | INTRAVENOUS | Status: AC
  Administered 2021-02-24: 150 mg via INTRAVENOUS
  Filled 2021-02-24: qty 25

## 2021-02-24 MED ORDER — HEPARIN SOD (PORK) LOCK FLUSH 100 UNIT/ML IV SOLN
500.0000 [IU] | Freq: Once | INTRAVENOUS | Status: AC | PRN
  Administered 2021-02-24: 500 [IU]

## 2021-02-24 MED ORDER — CARBOPLATIN CHEMO INJECTION 450 MG/45ML
200.0000 mg | Freq: Once | INTRAVENOUS | Status: AC
  Administered 2021-02-24: 200 mg via INTRAVENOUS
  Filled 2021-02-24: qty 20

## 2021-02-24 MED ORDER — SODIUM CHLORIDE 0.9 % IV SOLN
10.0000 mg | Freq: Once | INTRAVENOUS | Status: AC
  Administered 2021-02-24: 10 mg via INTRAVENOUS
  Filled 2021-02-24: qty 10

## 2021-02-24 MED ORDER — SODIUM CHLORIDE 0.9% FLUSH
10.0000 mL | INTRAVENOUS | Status: DC | PRN
  Administered 2021-02-24 (×2): 10 mL

## 2021-02-24 MED ORDER — FAMOTIDINE 20 MG IN NS 100 ML IVPB
20.0000 mg | Freq: Once | INTRAVENOUS | Status: AC
  Administered 2021-02-24: 20 mg via INTRAVENOUS
  Filled 2021-02-24: qty 20

## 2021-02-24 MED ORDER — DIPHENHYDRAMINE HCL 50 MG/ML IJ SOLN
50.0000 mg | Freq: Once | INTRAMUSCULAR | Status: AC
  Administered 2021-02-24: 50 mg via INTRAVENOUS
  Filled 2021-02-24: qty 1

## 2021-02-24 MED ORDER — PALONOSETRON HCL INJECTION 0.25 MG/5ML
0.2500 mg | Freq: Once | INTRAVENOUS | Status: AC
  Administered 2021-02-24: 0.25 mg via INTRAVENOUS
  Filled 2021-02-24: qty 5

## 2021-02-24 MED ORDER — SODIUM CHLORIDE 0.9 % IV SOLN: Freq: Once | INTRAVENOUS | Status: AC

## 2021-02-24 NOTE — Progress Notes (Signed)
Patient tolerated chemotherapy with no complaints voiced. Side effects with management reviewed understanding verbalized. Port site clean and dry with no bruising or swelling noted at site. Good blood return noted before and after administration of chemotherapy. Band aid applied. Patient left in satisfactory condition with VSS and no s/s of distress noted. 

## 2021-02-24 NOTE — Progress Notes (Signed)
Orders received to maintain dose of Carboplatin at 200 mg.  Plan updated.  T.O. Dr Rhys Martini, PharmD

## 2021-02-24 NOTE — Patient Instructions (Signed)
[facility]  Discharge Instructions: Thank you for choosing [facility] to provide your oncology and hematology care.  If you have a lab appointment with the [facility], please come in thru the Main Entrance and check in at the main information desk.  Wear comfortable clothing and clothing appropriate for easy access to any Portacath or PICC line.   We strive to give you quality time with your provider. You may need to reschedule your appointment if you arrive late (15 or more minutes).  Arriving late affects you and other patients whose appointments are after yours.  Also, if you miss three or more appointments without notifying the office, you may be dismissed from the clinic at the provider's discretion.      For prescription refill requests, have your pharmacy contact our office and allow 72 hours for refills to be completed.    Today you received the following chemotherapy and/or immunotherapy agents Taxol/Carbo. Return as scheduled.   To help prevent nausea and vomiting after your treatment, we encourage you to take your nausea medication as directed.  BELOW ARE SYMPTOMS THAT SHOULD BE REPORTED IMMEDIATELY: *FEVER GREATER THAN 100.4 F (38 C) OR HIGHER *CHILLS OR SWEATING *NAUSEA AND VOMITING THAT IS NOT CONTROLLED WITH YOUR NAUSEA MEDICATION *UNUSUAL SHORTNESS OF BREATH *UNUSUAL BRUISING OR BLEEDING *URINARY PROBLEMS (pain or burning when urinating, or frequent urination) *BOWEL PROBLEMS (unusual diarrhea, constipation, pain near the anus) TENDERNESS IN MOUTH AND THROAT WITH OR WITHOUT PRESENCE OF ULCERS (sore throat, sores in mouth, or a toothache) UNUSUAL RASH, SWELLING OR PAIN  UNUSUAL VAGINAL DISCHARGE OR ITCHING   Items with * indicate a potential emergency and should be followed up as soon as possible or go to the Emergency Department if any problems should occur.  Please show the CHEMOTHERAPY ALERT CARD or IMMUNOTHERAPY ALERT CARD at check-in to  the Emergency Department and triage nurse.  Should you have questions after your visit or need to cancel or reschedule your appointment, please contact [facility] [phone]  and follow the prompts.  Office hours are 8:00 a.m. to 4:30 p.m. Monday - Friday. Please note that voicemails left after 4:00 p.m. may not be returned until the following business day.  We are closed weekends and major holidays. You have access to a nurse at all times for urgent questions. Please call the main number to the clinic [phone] and follow the prompts.  For any non-urgent questions, you may also contact your provider using MyChart. We now offer e-Visits for anyone 42 and older to request care online for non-urgent symptoms. For details visit [email or web address].   Also download the MyChart app! Go to the app store, search "MyChart", open the app, select , and log in with your MyChart username and password.  Due to Covid, a mask is required upon entering the hospital/clinic. If you do not have a mask, one will be given to you upon arrival. For doctor visits, patients may have 1 support person aged 20 or older with them. For treatment visits, patients cannot have anyone with them due to current Covid guidelines and our immunocompromised population.

## 2021-02-25 LAB — CANCER ANTIGEN 15-3: CA 15-3: 80.2 U/mL — ABNORMAL HIGH (ref 0.0–25.0)

## 2021-02-25 LAB — CANCER ANTIGEN 27.29: CA 27.29: 77.9 U/mL — ABNORMAL HIGH (ref 0.0–38.6)

## 2021-03-03 ENCOUNTER — Other Ambulatory Visit: Payer: Self-pay

## 2021-03-03 ENCOUNTER — Inpatient Hospital Stay (HOSPITAL_COMMUNITY): Payer: Medicare HMO | Attending: Hematology

## 2021-03-03 ENCOUNTER — Encounter (HOSPITAL_COMMUNITY): Payer: Self-pay

## 2021-03-03 ENCOUNTER — Inpatient Hospital Stay (HOSPITAL_COMMUNITY): Payer: Medicare HMO

## 2021-03-03 VITALS — HR 103

## 2021-03-03 DIAGNOSIS — R059 Cough, unspecified: Secondary | ICD-10-CM | POA: Diagnosis not present

## 2021-03-03 DIAGNOSIS — R519 Headache, unspecified: Secondary | ICD-10-CM | POA: Diagnosis not present

## 2021-03-03 DIAGNOSIS — Z86718 Personal history of other venous thrombosis and embolism: Secondary | ICD-10-CM | POA: Diagnosis not present

## 2021-03-03 DIAGNOSIS — Z7901 Long term (current) use of anticoagulants: Secondary | ICD-10-CM | POA: Diagnosis not present

## 2021-03-03 DIAGNOSIS — C787 Secondary malignant neoplasm of liver and intrahepatic bile duct: Secondary | ICD-10-CM | POA: Diagnosis not present

## 2021-03-03 DIAGNOSIS — C50611 Malignant neoplasm of axillary tail of right female breast: Secondary | ICD-10-CM | POA: Diagnosis present

## 2021-03-03 DIAGNOSIS — E669 Obesity, unspecified: Secondary | ICD-10-CM | POA: Insufficient documentation

## 2021-03-03 DIAGNOSIS — R5383 Other fatigue: Secondary | ICD-10-CM | POA: Insufficient documentation

## 2021-03-03 DIAGNOSIS — Z23 Encounter for immunization: Secondary | ICD-10-CM | POA: Diagnosis not present

## 2021-03-03 DIAGNOSIS — G620 Drug-induced polyneuropathy: Secondary | ICD-10-CM | POA: Diagnosis not present

## 2021-03-03 DIAGNOSIS — Z95828 Presence of other vascular implants and grafts: Secondary | ICD-10-CM

## 2021-03-03 DIAGNOSIS — D702 Other drug-induced agranulocytosis: Secondary | ICD-10-CM

## 2021-03-03 DIAGNOSIS — Z5111 Encounter for antineoplastic chemotherapy: Secondary | ICD-10-CM | POA: Insufficient documentation

## 2021-03-03 DIAGNOSIS — C182 Malignant neoplasm of ascending colon: Secondary | ICD-10-CM | POA: Diagnosis not present

## 2021-03-03 DIAGNOSIS — D509 Iron deficiency anemia, unspecified: Secondary | ICD-10-CM

## 2021-03-03 DIAGNOSIS — R002 Palpitations: Secondary | ICD-10-CM | POA: Insufficient documentation

## 2021-03-03 DIAGNOSIS — Z170 Estrogen receptor positive status [ER+]: Secondary | ICD-10-CM | POA: Insufficient documentation

## 2021-03-03 DIAGNOSIS — C50812 Malignant neoplasm of overlapping sites of left female breast: Secondary | ICD-10-CM | POA: Diagnosis present

## 2021-03-03 DIAGNOSIS — R300 Dysuria: Secondary | ICD-10-CM | POA: Insufficient documentation

## 2021-03-03 DIAGNOSIS — C9000 Multiple myeloma not having achieved remission: Secondary | ICD-10-CM

## 2021-03-03 LAB — COMPREHENSIVE METABOLIC PANEL
ALT: 22 U/L (ref 0–44)
AST: 37 U/L (ref 15–41)
Albumin: 3.3 g/dL — ABNORMAL LOW (ref 3.5–5.0)
Alkaline Phosphatase: 96 U/L (ref 38–126)
Anion gap: 8 (ref 5–15)
BUN: 18 mg/dL (ref 8–23)
CO2: 24 mmol/L (ref 22–32)
Calcium: 8.5 mg/dL — ABNORMAL LOW (ref 8.9–10.3)
Chloride: 101 mmol/L (ref 98–111)
Creatinine, Ser: 0.66 mg/dL (ref 0.44–1.00)
GFR, Estimated: >60 mL/min (ref >60)
Glucose, Bld: 152 mg/dL — ABNORMAL HIGH (ref 70–99)
Potassium: 3.5 mmol/L (ref 3.5–5.1)
Sodium: 133 mmol/L — ABNORMAL LOW (ref 135–145)
Total Bilirubin: 0.7 mg/dL (ref 0.3–1.2)
Total Protein: 7 g/dL (ref 6.5–8.1)

## 2021-03-03 LAB — CBC WITH DIFFERENTIAL/PLATELET
Abs Immature Granulocytes: 0.01 10*3/uL (ref 0.00–0.07)
Basophils Absolute: 0 10*3/uL (ref 0.0–0.1)
Basophils Relative: 1 %
Eosinophils Absolute: 0 10*3/uL (ref 0.0–0.5)
Eosinophils Relative: 1 %
HCT: 27.8 % — ABNORMAL LOW (ref 36.0–46.0)
Hemoglobin: 8.7 g/dL — ABNORMAL LOW (ref 12.0–15.0)
Immature Granulocytes: 1 %
Lymphocytes Relative: 46 %
Lymphs Abs: 0.9 10*3/uL (ref 0.7–4.0)
MCH: 26.4 pg (ref 26.0–34.0)
MCHC: 31.3 g/dL (ref 30.0–36.0)
MCV: 84.5 fL (ref 80.0–100.0)
Monocytes Absolute: 0.2 10*3/uL (ref 0.1–1.0)
Monocytes Relative: 8 %
Neutro Abs: 0.8 10*3/uL — ABNORMAL LOW (ref 1.7–7.7)
Neutrophils Relative %: 43 %
Platelets: 155 10*3/uL (ref 150–400)
RBC: 3.29 MIL/uL — ABNORMAL LOW (ref 3.87–5.11)
RDW: 21.6 % — ABNORMAL HIGH (ref 11.5–15.5)
WBC: 2 10*3/uL — ABNORMAL LOW (ref 4.0–10.5)
nRBC: 0 % (ref 0.0–0.2)

## 2021-03-03 MED ORDER — FILGRASTIM-SNDZ 480 MCG/0.8ML IJ SOSY
480.0000 ug | PREFILLED_SYRINGE | Freq: Once | INTRAMUSCULAR | Status: AC
  Administered 2021-03-03: 480 ug via SUBCUTANEOUS
  Filled 2021-03-03: qty 0.8

## 2021-03-03 MED ORDER — SODIUM CHLORIDE 0.9% FLUSH
10.0000 mL | Freq: Once | INTRAVENOUS | Status: AC
  Administered 2021-03-03: 10 mL via INTRAVENOUS

## 2021-03-03 MED ORDER — HEPARIN SOD (PORK) LOCK FLUSH 100 UNIT/ML IV SOLN
500.0000 [IU] | Freq: Once | INTRAVENOUS | Status: AC
  Administered 2021-03-03: 500 [IU] via INTRAVENOUS

## 2021-03-03 NOTE — Progress Notes (Signed)
Patients port flushed without difficulty.  Good blood return noted with no bruising or swelling noted at site.  Stable during access and blood draw.  Patient to remain accessed for treatment. 

## 2021-03-03 NOTE — Patient Instructions (Signed)
[facility]  Discharge Instructions: Thank you for choosing [facility] to provide your oncology and hematology care.  If you have a lab appointment with the [facility], please come in thru the Main Entrance and check in at the main information desk.  Wear comfortable clothing and clothing appropriate for easy access to any Portacath or PICC line.   We strive to give you quality time with your provider. You may need to reschedule your appointment if you arrive late (15 or more minutes).  Arriving late affects you and other patients whose appointments are after yours.  Also, if you miss three or more appointments without notifying the office, you may be dismissed from the clinic at the provider's discretion.      For prescription refill requests, have your pharmacy contact our office and allow 72 hours for refills to be completed.    Today you received the following Zarxio injection. No treatment today per Dr. Delton Coombes. Return tomorrow for labs and possible treatment.   To help prevent nausea and vomiting after your treatment, we encourage you to take your nausea medication as directed.  BELOW ARE SYMPTOMS THAT SHOULD BE REPORTED IMMEDIATELY: *FEVER GREATER THAN 100.4 F (38 C) OR HIGHER *CHILLS OR SWEATING *NAUSEA AND VOMITING THAT IS NOT CONTROLLED WITH YOUR NAUSEA MEDICATION *UNUSUAL SHORTNESS OF BREATH *UNUSUAL BRUISING OR BLEEDING *URINARY PROBLEMS (pain or burning when urinating, or frequent urination) *BOWEL PROBLEMS (unusual diarrhea, constipation, pain near the anus) TENDERNESS IN MOUTH AND THROAT WITH OR WITHOUT PRESENCE OF ULCERS (sore throat, sores in mouth, or a toothache) UNUSUAL RASH, SWELLING OR PAIN  UNUSUAL VAGINAL DISCHARGE OR ITCHING   Items with * indicate a potential emergency and should be followed up as soon as possible or go to the Emergency Department if any problems should occur.  Please show the CHEMOTHERAPY ALERT CARD or  IMMUNOTHERAPY ALERT CARD at check-in to the Emergency Department and triage nurse.  Should you have questions after your visit or need to cancel or reschedule your appointment, please contact [facility] [phone]  and follow the prompts.  Office hours are 8:00 a.m. to 4:30 p.m. Monday - Friday. Please note that voicemails left after 4:00 p.m. may not be returned until the following business day.  We are closed weekends and major holidays. You have access to a nurse at all times for urgent questions. Please call the main number to the clinic [phone] and follow the prompts.  For any non-urgent questions, you may also contact your provider using MyChart. We now offer e-Visits for anyone 83 and older to request care online for non-urgent symptoms. For details visit [email or web address].   Also download the MyChart app! Go to the app store, search "MyChart", open the app, select [facility], and log in with your MyChart username and password.  Due to Covid, a mask is required upon entering the hospital/clinic. If you do not have a mask, one will be given to you upon arrival. For doctor visits, patients may have 1 support person aged 66 or older with them. For treatment visits, patients cannot have anyone with them due to current Covid guidelines and our immunocompromised population.

## 2021-03-03 NOTE — Progress Notes (Signed)
Patient arrives for treatment today. Patient's ANC 0.8. No treatment today per Dr. Delton Coombes, received orders for Zarxio injection, labs will be drawn tomorrow for possible treatment. Patient reports burning during urination and seeing blood in her urine, Dr. Delton Coombes notified and received orders to obtain urinalysis and urine culture during tomorrows visit. Patient's lab and treatment appointments rescheduled. Patient tolerated Zarxio injection with no complaints voiced. Site clean and dry with no bruising or swelling noted at site. See MAR for details. Band aid applied.  Patient stable during and after injection.   Port flushed with good blood return noted. No bruising or swelling at site. Bandaid applied and patient discharged in satisfactory condition via wheelchair. VVS stable with no signs or symptoms of distressed noted.

## 2021-03-03 NOTE — Progress Notes (Signed)
Chaplain engaged in a follow-up visit with Crystal Vega, wishing her a happy birthday.  She is looking forward to going home and resting today.  She stated that she knew she was not feeling well today because she didn't wake up with the same amount of energy that she normally does.  She also noticed some changes in her urination.  She voiced that her arm has still not been hurting but that it feels numb now.  Chaplain asked Crystal Vega about what meal she would value having today on her birthday, and she talked about loving her husband's spaghetti and the way her daughter cooks baked beans, fried fish, and macaroni and cheese.  Chaplain encouraged Crystal Vega to celebrate her day.  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  Chaplain offered support, presence, and listening.    03/03/21 1100  Clinical Encounter Type  Visited With Patient  Visit Type Follow-up

## 2021-03-04 ENCOUNTER — Inpatient Hospital Stay (HOSPITAL_COMMUNITY): Payer: Medicare HMO

## 2021-03-04 ENCOUNTER — Encounter (HOSPITAL_COMMUNITY): Payer: Self-pay

## 2021-03-04 VITALS — BP 115/70 | HR 99 | Temp 98.6°F | Resp 18

## 2021-03-04 DIAGNOSIS — Z95828 Presence of other vascular implants and grafts: Secondary | ICD-10-CM

## 2021-03-04 DIAGNOSIS — Z170 Estrogen receptor positive status [ER+]: Secondary | ICD-10-CM

## 2021-03-04 DIAGNOSIS — C9000 Multiple myeloma not having achieved remission: Secondary | ICD-10-CM

## 2021-03-04 DIAGNOSIS — Z5111 Encounter for antineoplastic chemotherapy: Secondary | ICD-10-CM | POA: Diagnosis not present

## 2021-03-04 DIAGNOSIS — R300 Dysuria: Secondary | ICD-10-CM

## 2021-03-04 DIAGNOSIS — Z23 Encounter for immunization: Secondary | ICD-10-CM

## 2021-03-04 DIAGNOSIS — C50812 Malignant neoplasm of overlapping sites of left female breast: Secondary | ICD-10-CM

## 2021-03-04 LAB — CBC WITH DIFFERENTIAL/PLATELET
Band Neutrophils: 17 %
Basophils Absolute: 0 10*3/uL (ref 0.0–0.1)
Basophils Relative: 0 %
Eosinophils Absolute: 0.1 10*3/uL (ref 0.0–0.5)
Eosinophils Relative: 1 %
HCT: 27.3 % — ABNORMAL LOW (ref 36.0–46.0)
Hemoglobin: 8.4 g/dL — ABNORMAL LOW (ref 12.0–15.0)
Lymphocytes Relative: 25 %
Lymphs Abs: 1.3 10*3/uL (ref 0.7–4.0)
MCH: 26 pg (ref 26.0–34.0)
MCHC: 30.8 g/dL (ref 30.0–36.0)
MCV: 84.5 fL (ref 80.0–100.0)
Metamyelocytes Relative: 2 %
Monocytes Absolute: 0.3 10*3/uL (ref 0.1–1.0)
Monocytes Relative: 5 %
Neutro Abs: 3.5 10*3/uL (ref 1.7–7.7)
Neutrophils Relative %: 50 %
Platelets: 155 10*3/uL (ref 150–400)
RBC: 3.23 MIL/uL — ABNORMAL LOW (ref 3.87–5.11)
RDW: 22 % — ABNORMAL HIGH (ref 11.5–15.5)
WBC: 5.2 10*3/uL (ref 4.0–10.5)
nRBC: 0.6 % — ABNORMAL HIGH (ref 0.0–0.2)

## 2021-03-04 LAB — URINALYSIS, ROUTINE W REFLEX MICROSCOPIC
Bilirubin Urine: NEGATIVE
Glucose, UA: NEGATIVE mg/dL
Ketones, ur: NEGATIVE mg/dL
Nitrite: NEGATIVE
Protein, ur: 30 mg/dL — AB
RBC / HPF: >50 RBC/hpf — ABNORMAL HIGH (ref 0–5)
Specific Gravity, Urine: 1.021 (ref 1.005–1.030)
WBC, UA: >50 WBC/hpf — ABNORMAL HIGH (ref 0–5)
pH: 5 (ref 5.0–8.0)

## 2021-03-04 MED ORDER — SODIUM CHLORIDE 0.9 % IV SOLN
10.0000 mg | Freq: Once | INTRAVENOUS | Status: AC
  Administered 2021-03-04: 10 mg via INTRAVENOUS
  Filled 2021-03-04: qty 10

## 2021-03-04 MED ORDER — FAMOTIDINE 20 MG IN NS 100 ML IVPB
20.0000 mg | Freq: Once | INTRAVENOUS | Status: AC
  Administered 2021-03-04: 20 mg via INTRAVENOUS
  Filled 2021-03-04: qty 100

## 2021-03-04 MED ORDER — SODIUM CHLORIDE 0.9 % IV SOLN
200.0000 mg | Freq: Once | INTRAVENOUS | Status: AC
  Administered 2021-03-04: 200 mg via INTRAVENOUS
  Filled 2021-03-04: qty 20

## 2021-03-04 MED ORDER — SODIUM CHLORIDE 0.9% FLUSH
10.0000 mL | INTRAVENOUS | Status: DC | PRN
  Administered 2021-03-04: 10 mL

## 2021-03-04 MED ORDER — SODIUM CHLORIDE 0.9 % IV SOLN
64.0000 mg/m2 | Freq: Once | INTRAVENOUS | Status: AC
  Administered 2021-03-04: 150 mg via INTRAVENOUS
  Filled 2021-03-04: qty 25

## 2021-03-04 MED ORDER — CIPROFLOXACIN HCL 500 MG PO TABS: 500.0000 mg | ORAL_TABLET | Freq: Two times a day (BID) | ORAL | 0 refills | Status: DC

## 2021-03-04 MED ORDER — SODIUM CHLORIDE 0.9 % IV SOLN: Freq: Once | INTRAVENOUS | Status: AC

## 2021-03-04 MED ORDER — PALONOSETRON HCL INJECTION 0.25 MG/5ML
0.2500 mg | Freq: Once | INTRAVENOUS | Status: AC
  Administered 2021-03-04: 0.25 mg via INTRAVENOUS
  Filled 2021-03-04: qty 5

## 2021-03-04 MED ORDER — HEPARIN SOD (PORK) LOCK FLUSH 100 UNIT/ML IV SOLN
500.0000 [IU] | Freq: Once | INTRAVENOUS | Status: AC | PRN
  Administered 2021-03-04: 500 [IU]

## 2021-03-04 MED ORDER — DIPHENHYDRAMINE HCL 50 MG/ML IJ SOLN
50.0000 mg | Freq: Once | INTRAMUSCULAR | Status: AC
  Administered 2021-03-04: 50 mg via INTRAVENOUS
  Filled 2021-03-04: qty 1

## 2021-03-04 MED ORDER — INFLUENZA VAC A&B SA ADJ QUAD 0.5 ML IM PRSY
0.5000 mL | PREFILLED_SYRINGE | Freq: Once | INTRAMUSCULAR | Status: AC
  Administered 2021-03-04: 0.5 mL via INTRAMUSCULAR
  Filled 2021-03-04: qty 0.5

## 2021-03-04 NOTE — Patient Instructions (Signed)
[facility]  Discharge Instructions: Thank you for choosing [facility] to provide your oncology and hematology care.  If you have a lab appointment with the [facility], please come in thru the Main Entrance and check in at the main information desk.  Wear comfortable clothing and clothing appropriate for easy access to any Portacath or PICC line.   We strive to give you quality time with your provider. You may need to reschedule your appointment if you arrive late (15 or more minutes).  Arriving late affects you and other patients whose appointments are after yours.  Also, if you miss three or more appointments without notifying the office, you may be dismissed from the clinic at the provider's discretion.      For prescription refill requests, have your pharmacy contact our office and allow 72 hours for refills to be completed.    Today you received the following chemotherapy and/or immunotherapy agents carbo/taxol Flu shot given today      To help prevent nausea and vomiting after your treatment, we encourage you to take your nausea medication as directed.  BELOW ARE SYMPTOMS THAT SHOULD BE REPORTED IMMEDIATELY: *FEVER GREATER THAN 100.4 F (38 C) OR HIGHER *CHILLS OR SWEATING *NAUSEA AND VOMITING THAT IS NOT CONTROLLED WITH YOUR NAUSEA MEDICATION *UNUSUAL SHORTNESS OF BREATH *UNUSUAL BRUISING OR BLEEDING *URINARY PROBLEMS (pain or burning when urinating, or frequent urination) *BOWEL PROBLEMS (unusual diarrhea, constipation, pain near the anus) TENDERNESS IN MOUTH AND THROAT WITH OR WITHOUT PRESENCE OF ULCERS (sore throat, sores in mouth, or a toothache) UNUSUAL RASH, SWELLING OR PAIN  UNUSUAL VAGINAL DISCHARGE OR ITCHING   Items with * indicate a potential emergency and should be followed up as soon as possible or go to the Emergency Department if any problems should occur.  Please show the CHEMOTHERAPY ALERT CARD or IMMUNOTHERAPY ALERT CARD at check-in  to the Emergency Department and triage nurse.  Should you have questions after your visit or need to cancel or reschedule your appointment, please contact [facility] [phone]  and follow the prompts.  Office hours are 8:00 a.m. to 4:30 p.m. Monday - Friday. Please note that voicemails left after 4:00 p.m. may not be returned until the following business day.  We are closed weekends and major holidays. You have access to a nurse at all times for urgent questions. Please call the main number to the clinic [phone] and follow the prompts.  For any non-urgent questions, you may also contact your provider using MyChart. We now offer e-Visits for anyone 39 and older to request care online for non-urgent symptoms. For details visit [email or web address].   Also download the MyChart app! Go to the app store, search "MyChart", open the app, select [facility], and log in with your MyChart username and password.  Due to Covid, a mask is required upon entering the hospital/clinic. If you do not have a mask, one will be given to you upon arrival. For doctor visits, patients may have 1 support person aged 63 or older with them. For treatment visits, patients cannot have anyone with them due to current Covid guidelines and our immunocompromised population.   Carboplatin injection What is this medication? CARBOPLATIN (KAR boe pla tin) is a chemotherapy drug. It targets fast dividing cells, like cancer cells, and causes these cells to die. This medicine is used to treat ovarian cancer and many other cancers. This medicine may be used for other purposes; ask your health care provider  or pharmacist if you have questions. COMMON BRAND NAME(S): Paraplatin What should I tell my care team before I take this medication? They need to know if you have any of these conditions: blood disorders hearing problems kidney disease recent or ongoing radiation therapy an unusual or allergic reaction to  carboplatin, cisplatin, other chemotherapy, other medicines, foods, dyes, or preservatives pregnant or trying to get pregnant breast-feeding How should I use this medication? This drug is usually given as an infusion into a vein. It is administered in a hospital or clinic by a specially trained health care professional. Talk to your pediatrician regarding the use of this medicine in children. Special care may be needed. Overdosage: If you think you have taken too much of this medicine contact a poison control center or emergency room at once. NOTE: This medicine is only for you. Do not share this medicine with others. What if I miss a dose? It is important not to miss a dose. Call your doctor or health care professional if you are unable to keep an appointment. What may interact with this medication? medicines for seizures medicines to increase blood counts like filgrastim, pegfilgrastim, sargramostim some antibiotics like amikacin, gentamicin, neomycin, streptomycin, tobramycin vaccines Talk to your doctor or health care professional before taking any of these medicines: acetaminophen aspirin ibuprofen ketoprofen naproxen This list may not describe all possible interactions. Give your health care provider a list of all the medicines, herbs, non-prescription drugs, or dietary supplements you use. Also tell them if you smoke, drink alcohol, or use illegal drugs. Some items may interact with your medicine. What should I watch for while using this medication? Your condition will be monitored carefully while you are receiving this medicine. You will need important blood work done while you are taking this medicine. This drug may make you feel generally unwell. This is not uncommon, as chemotherapy can affect healthy cells as well as cancer cells. Report any side effects. Continue your course of treatment even though you feel ill unless your doctor tells you to stop. In some cases, you may be  given additional medicines to help with side effects. Follow all directions for their use. Call your doctor or health care professional for advice if you get a fever, chills or sore throat, or other symptoms of a cold or flu. Do not treat yourself. This drug decreases your body's ability to fight infections. Try to avoid being around people who are sick. This medicine may increase your risk to bruise or bleed. Call your doctor or health care professional if you notice any unusual bleeding. Be careful brushing and flossing your teeth or using a toothpick because you may get an infection or bleed more easily. If you have any dental work done, tell your dentist you are receiving this medicine. Avoid taking products that contain aspirin, acetaminophen, ibuprofen, naproxen, or ketoprofen unless instructed by your doctor. These medicines may hide a fever. Do not become pregnant while taking this medicine. Women should inform their doctor if they wish to become pregnant or think they might be pregnant. There is a potential for serious side effects to an unborn child. Talk to your health care professional or pharmacist for more information. Do not breast-feed an infant while taking this medicine. What side effects may I notice from receiving this medication? Side effects that you should report to your doctor or health care professional as soon as possible: allergic reactions like skin rash, itching or hives, swelling of the face, lips, or  tongue signs of infection - fever or chills, cough, sore throat, pain or difficulty passing urine signs of decreased platelets or bleeding - bruising, pinpoint red spots on the skin, black, tarry stools, nosebleeds signs of decreased red blood cells - unusually weak or tired, fainting spells, lightheadedness breathing problems changes in hearing changes in vision chest pain high blood pressure low blood counts - This drug may decrease the number of white blood cells, red  blood cells and platelets. You may be at increased risk for infections and bleeding. nausea and vomiting pain, swelling, redness or irritation at the injection site pain, tingling, numbness in the hands or feet problems with balance, talking, walking trouble passing urine or change in the amount of urine Side effects that usually do not require medical attention (report to your doctor or health care professional if they continue or are bothersome): hair loss loss of appetite metallic taste in the mouth or changes in taste This list may not describe all possible side effects. Call your doctor for medical advice about side effects. You may report side effects to FDA at 1-800-FDA-1088. Where should I keep my medication? This drug is given in a hospital or clinic and will not be stored at home. NOTE: This sheet is a summary. It may not cover all possible information. If you have questions about this medicine, talk to your doctor, pharmacist, or health care provider.  2022 Elsevier/Gold Standard (2007-08-22 14:38:05)   Paclitaxel injection What is this medication? PACLITAXEL (PAK li TAX el) is a chemotherapy drug. It targets fast dividing cells, like cancer cells, and causes these cells to die. This medicine is used to treat ovarian cancer, breast cancer, lung cancer, Kaposi's sarcoma, and other cancers. This medicine may be used for other purposes; ask your health care provider or pharmacist if you have questions. COMMON BRAND NAME(S): Onxol, Taxol What should I tell my care team before I take this medication? They need to know if you have any of these conditions: history of irregular heartbeat liver disease low blood counts, like low white cell, platelet, or red cell counts lung or breathing disease, like asthma tingling of the fingers or toes, or other nerve disorder an unusual or allergic reaction to paclitaxel, alcohol, polyoxyethylated castor oil, other chemotherapy, other medicines,  foods, dyes, or preservatives pregnant or trying to get pregnant breast-feeding How should I use this medication? This drug is given as an infusion into a vein. It is administered in a hospital or clinic by a specially trained health care professional. Talk to your pediatrician regarding the use of this medicine in children. Special care may be needed. Overdosage: If you think you have taken too much of this medicine contact a poison control center or emergency room at once. NOTE: This medicine is only for you. Do not share this medicine with others. What if I miss a dose? It is important not to miss your dose. Call your doctor or health care professional if you are unable to keep an appointment. What may interact with this medication? Do not take this medicine with any of the following medications: live virus vaccines This medicine may also interact with the following medications: antiviral medicines for hepatitis, HIV or AIDS certain antibiotics like erythromycin and clarithromycin certain medicines for fungal infections like ketoconazole and itraconazole certain medicines for seizures like carbamazepine, phenobarbital, phenytoin gemfibrozil nefazodone rifampin St. John's wort This list may not describe all possible interactions. Give your health care provider a list of all the medicines,  herbs, non-prescription drugs, or dietary supplements you use. Also tell them if you smoke, drink alcohol, or use illegal drugs. Some items may interact with your medicine. What should I watch for while using this medication? Your condition will be monitored carefully while you are receiving this medicine. You will need important blood work done while you are taking this medicine. This medicine can cause serious allergic reactions. To reduce your risk you will need to take other medicine(s) before treatment with this medicine. If you experience allergic reactions like skin rash, itching or hives, swelling  of the face, lips, or tongue, tell your doctor or health care professional right away. In some cases, you may be given additional medicines to help with side effects. Follow all directions for their use. This drug may make you feel generally unwell. This is not uncommon, as chemotherapy can affect healthy cells as well as cancer cells. Report any side effects. Continue your course of treatment even though you feel ill unless your doctor tells you to stop. Call your doctor or health care professional for advice if you get a fever, chills or sore throat, or other symptoms of a cold or flu. Do not treat yourself. This drug decreases your body's ability to fight infections. Try to avoid being around people who are sick. This medicine may increase your risk to bruise or bleed. Call your doctor or health care professional if you notice any unusual bleeding. Be careful brushing and flossing your teeth or using a toothpick because you may get an infection or bleed more easily. If you have any dental work done, tell your dentist you are receiving this medicine. Avoid taking products that contain aspirin, acetaminophen, ibuprofen, naproxen, or ketoprofen unless instructed by your doctor. These medicines may hide a fever. Do not become pregnant while taking this medicine. Women should inform their doctor if they wish to become pregnant or think they might be pregnant. There is a potential for serious side effects to an unborn child. Talk to your health care professional or pharmacist for more information. Do not breast-feed an infant while taking this medicine. Men are advised not to father a child while receiving this medicine. This product may contain alcohol. Ask your pharmacist or healthcare provider if this medicine contains alcohol. Be sure to tell all healthcare providers you are taking this medicine. Certain medicines, like metronidazole and disulfiram, can cause an unpleasant reaction when taken with alcohol.  The reaction includes flushing, headache, nausea, vomiting, sweating, and increased thirst. The reaction can last from 30 minutes to several hours. What side effects may I notice from receiving this medication? Side effects that you should report to your doctor or health care professional as soon as possible: allergic reactions like skin rash, itching or hives, swelling of the face, lips, or tongue breathing problems changes in vision fast, irregular heartbeat high or low blood pressure mouth sores pain, tingling, numbness in the hands or feet signs of decreased platelets or bleeding - bruising, pinpoint red spots on the skin, black, tarry stools, blood in the urine signs of decreased red blood cells - unusually weak or tired, feeling faint or lightheaded, falls signs of infection - fever or chills, cough, sore throat, pain or difficulty passing urine signs and symptoms of liver injury like dark yellow or brown urine; general ill feeling or flu-like symptoms; light-colored stools; loss of appetite; nausea; right upper belly pain; unusually weak or tired; yellowing of the eyes or skin swelling of the ankles, feet, hands  unusually slow heartbeat Side effects that usually do not require medical attention (report to your doctor or health care professional if they continue or are bothersome): diarrhea hair loss loss of appetite muscle or joint pain nausea, vomiting pain, redness, or irritation at site where injected tiredness This list may not describe all possible side effects. Call your doctor for medical advice about side effects. You may report side effects to FDA at 1-800-FDA-1088. Where should I keep my medication? This drug is given in a hospital or clinic and will not be stored at home. NOTE: This sheet is a summary. It may not cover all possible information. If you have questions about this medicine, talk to your doctor, pharmacist, or health care provider.  2022 Elsevier/Gold  Standard (2019-04-18 13:37:23)

## 2021-03-04 NOTE — Progress Notes (Signed)
Carbo/Taxol  given today per MD orders. Tolerated infusion without adverse affects. Vital signs stable. No complaints at this time. Discharged from clinic via wheelchair in stable condition. Alert and oriented x 3. F/U with [facility] as scheduled.

## 2021-03-04 NOTE — Progress Notes (Signed)
Labs and vital signs WNL for treatment today.  Urinalysis completed with culture.  Pt complaining of urinary pressure.  Pt did see some blood in urine 2 days ago.  Pt states that there is some burning at the completion of urination.

## 2021-03-06 LAB — URINE CULTURE: Culture: 50000 — AB

## 2021-03-16 ENCOUNTER — Other Ambulatory Visit (HOSPITAL_COMMUNITY): Payer: Self-pay

## 2021-03-16 DIAGNOSIS — Z95828 Presence of other vascular implants and grafts: Secondary | ICD-10-CM

## 2021-03-16 DIAGNOSIS — C9000 Multiple myeloma not having achieved remission: Secondary | ICD-10-CM

## 2021-03-16 DIAGNOSIS — C50812 Malignant neoplasm of overlapping sites of left female breast: Secondary | ICD-10-CM

## 2021-03-16 NOTE — Progress Notes (Signed)
[place], [place] [address]   CLINIC:  Medical Oncology/Hematology  PCP:  Lemmie Evens, MD [place]. / [place] [address] [phone]   REASON FOR VISIT:  Follow-up for left breast cancer  PRIOR THERAPY:  1. Paclitaxel x 6 cycles from 12/31/2019 to 06/02/2020. 2. Xeloda through 09/17/2020.  NGS Results: ER/HER-2 positive, PR negative, Ki-67 30%  CURRENT THERAPY: Carboplatin & Taxol D1,8,15 every 4 weeks x 6 cycles  BRIEF ONCOLOGIC HISTORY:  Oncology History  Malignant neoplasm of overlapping sites of left breast in female, estrogen receptor positive ([place])  11/16/2019 Cancer Staging   Staging form: Breast, AJCC 8th Edition - Clinical stage from 11/16/2019: Stage IV (cT4b, cN1, cM1, G3, ER+, PR-, HER2-) - Signed by [name], MD on 12/17/2019   11/28/2019 Initial Diagnosis   Patient noted intermittent left breast pain and swelling for several months. Mammogram showed a 6.2cm mass in the left breast axillary tail with an adjacent 0.9cm mass, 2 abnormal left axillary lymph nodes, 1 abnormal right axillary lymph node, and diffuse left breast edema and skin thickening. Biopsy showed IDC, grade 3 in the left breast and bilateral axillas, HER-2 negative (1+), ER+ 100%, PR- 0%, Ki67 30%.    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 ([place])   02/01/2021 Genetic Testing           CANCER STAGING: Cancer Staging 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 female, returns for routine follow-up and consideration for next cycle of chemotherapy. Crystal Vega was last seen on 02/17/2021.  Due for cycle #3 of Carboplatin and Taxol today.   Overall, she tells me she has been feeling pretty well. She took 4 tablets of Cipro before stopping after experiencing palpitations. She reports stable numbness in her feet and improved tingling in her fingertips. She denies n/v/d and hematuria. She reports continued tightness under her left arm although the pain has resolved. She reports changes in taste, but her appetite remains good. She denies rash and SOB.   Overall, she feels ready for next cycle of chemo today.   REVIEW OF SYSTEMS:  Review of Systems  Constitutional:  Positive for fatigue (depleted). Negative for appetite change.  Respiratory:  Positive for cough. Negative for shortness of breath.   Cardiovascular:  Positive for palpitations.  Gastrointestinal:  Negative for diarrhea, nausea and vomiting.  Genitourinary:  Positive for dysuria. Negative for hematuria.   Skin:  Negative for rash.  Neurological:  Positive for headaches and numbness.  Psychiatric/Behavioral:  Positive for sleep disturbance.   All other systems reviewed and are negative.  PAST MEDICAL/SURGICAL HISTORY:  Past Medical History:  Diagnosis Date   Anemia    Arthritis    per patient " left  knee"   DVT (deep venous thrombosis) (HCC)    left leg   Dyspnea    Essential hypertension, benign    Family history of cancer of female genital organ    Family history of GI tract cancer    Metastatic breast cancer ([place])    left breast   Port-A-Cath in place 12/25/2019   Type 2 diabetes mellitus [facility] 2)    Past Surgical History:  Procedure Laterality Date   BIOPSY  11/27/2020   Procedure: BIOPSY;  Surgeon: Daneil Dolin, MD;  Location[facility];  Service: Endoscopy;;   CATARACT EXTRACTION W/ INTRAOCULAR LENS IMPLANT Right    COLONOSCOPY WITH PROPOFOL N/A 11/27/2020   Procedure: COLONOSCOPY WITH PROPOFOL;  Surgeon: Daneil Dolin, MD;  Location: [facility];  Service: Endoscopy;  Laterality: N/A;  9:00am   HYSTEROSCOPY WITH D & C N/A 06/05/2014   Procedure: DILATATION AND CURETTAGE /HYSTEROSCOPY;  Surgeon: Florian Buff, MD;  Location: [facility] ORS;  Service: Gynecology;  Laterality: N/A;   POLYPECTOMY N/A 06/05/2014   Procedure: ENDOMETRIAL POLYPECTOMY;  Surgeon: Florian Buff, MD;  Location: [facility] OR[facility];  Service: Gynecology;  Laterality: N/A;   PORTACATH PLACEMENT N/A 12/14/2019   Procedure: INSERTION PORT-A-CATH WITH ULTRASOUND GUIDANCE;  Surgeon: Donnie Mesa, MD;  Location: [facility];  Service: General;  Laterality: N/A;   TUBAL LIGATION      SOCIAL HISTORY:  Social History   Socioeconomic History   Marital status: Married    Spouse name: Crystal Vega   Number of children: 2   Years of education: Not on file   Highest education level: Not on file  Occupational History   Occupation: retired  Tobacco Use   Smoking status: Former    Types: Cigarettes   Smokeless tobacco: Never   Tobacco comments:    quit about 40+ years ago  Vaping Use   Vaping Use: Never used  Substance and Sexual Activity   Alcohol use: Never   Drug use: No   Sexual activity: Not on file  Other Topics Concern   Not on file  Social History Narrative   Not on file   Social Determinants of Health   Financial  Resource Strain: Not on file  Food Insecurity: Not on file  Transportation Needs: Not on file  Physical Activity: Not on file  Stress: Not on file  Social Connections: Not on file  Intimate Partner Violence: Not on file    FAMILY HISTORY:  Family History  Problem Relation Age of Onset   Diabetes Mellitus II Father    Congestive Heart Failure Father    Colon cancer Mother 90       patient not sure if colon vs stomach   Stroke Maternal Grandmother    Cancer Cousin        female reproductive cancer, dx. 50s/60s    CURRENT MEDICATIONS:  Current Outpatient Medications  Medication Sig Dispense Refill   apixaban (ELIQUIS) 5 MG TABS tablet Take 1 tablet (5 mg total) by mouth 2 (two) times daily. 60 tablet 3   chlorpheniramine-HYDROcodone (TUSSIONEX) 10-8  MG/5ML SUER SMARTSIG:1 Teaspoon By Mouth Twice Daily (Patient taking differently: Take 5 mLs by mouth daily as needed for cough.) 480 mL 0   ciprofloxacin (CIPRO) 500 MG tablet Take 1 tablet (500 mg total) by mouth 2 (two) times daily. 10 tablet 0   ferrous sulfate 325 (65 FE) MG tablet Take 325 mg by mouth daily with breakfast.     folic acid (FOLVITE) 1 MG tablet TAKE 1 TABLET BY MOUTH EVERY DAY 90 tablet 1   hydrochlorothiazide (HYDRODIURIL) 25 MG tablet Take 25 mg by mouth daily.     HYDROcodone-acetaminophen (NORCO/VICODIN) 5-325 MG tablet Take 1 tablet by mouth every 6 (six) hours as needed for moderate pain. 15 tablet 0   HYDROmorphone (DILAUDID) 2 MG tablet Take 1 tablet (2 mg total) by mouth every 4 (four) hours as needed for severe pain. 90 tablet 0   ibuprofen (ADVIL) 800 MG tablet Take 800 mg by mouth 3 (three) times daily.     Ketotifen Fumarate (ITCHY EYE DROPS OP) Place 1 drop into both eyes daily as needed (Itching eyes).     KLOR-CON M10 10 MEQ tablet Take 10 mEq by mouth daily.     lidocaine-prilocaine (EMLA) cream Apply 1 application topically as needed. 30 g 3   losartan (COZAAR) 50 MG tablet Take 50 mg by mouth daily.      metFORMIN (GLUCOPHAGE) 500 MG tablet Take 500 mg by mouth 2 (two) times daily with a meal.     OVER THE COUNTER MEDICATION Apply 1 application topically daily as needed (Knee pain). Absorbine Jr. Roll on     pravastatin (PRAVACHOL) 10 MG tablet Take 10 mg by mouth daily.      prochlorperazine (COMPAZINE) 10 MG tablet Take 1 tablet (10 mg total) by mouth every 6 (six) hours as needed (Nausea or vomiting). 60 tablet 3   No current facility-administered medications for this visit.    ALLERGIES:  No Known Allergies  PHYSICAL EXAM:  Performance status (ECOG): 1 - Symptomatic but completely ambulatory  There were no vitals filed for this visit. Wt Readings from Last 3 Encounters:  03/03/21 258 lb 12.8 oz (117.4 kg)  02/17/21 262 lb 12.8 oz (119.2 kg)  02/03/21 262 lb 6.4 oz (119 kg)   Physical Exam Vitals reviewed.  Constitutional:      Appearance: Normal appearance. She is obese.  Cardiovascular:     Rate and Rhythm: Normal rate and regular rhythm.     Pulses: Normal pulses.     Heart sounds: Normal heart sounds.  Pulmonary:     Effort: Pulmonary effort is normal.     Breath sounds: Normal breath sounds.  Musculoskeletal:     Right lower leg: No edema.     Left lower leg: No edema.  Neurological:     General: No focal deficit present.     Mental Status: She is alert and oriented to person, place, and time.  Psychiatric:        Mood and Affect: Mood normal.        Behavior: Behavior normal.    LABORATORY DATA:  I have reviewed the labs as listed.  CBC Latest Ref Rng & Units 03/04/2021 03/03/2021 02/24/2021  WBC 4.0 - 10.5 K/uL 5.2 2.0(L) 3.7(L)  Hemoglobin 12.0 - 15.0 g/dL 8.4(L) 8.7(L) 9.0(L)  Hematocrit 36.0 - 46.0 % 27.3(L) 27.8(L) 28.8(L)  Platelets 150 - 400 K/uL 155 155 126(L)   CMP Latest Ref Rng & Units 03/03/2021 02/24/2021 02/17/2021  Glucose 70 -  99 mg/dL 152(H) 138(H) 148(H)  BUN 8 - 23 mg/dL _0 Creatinine 0.44 - 1.00 mg/dL 0.66 0.65 0.77  Sodium 135  - 145 mmol/L 133(L) 135 133(L)  Potassium 3.5 - 5.1 mmol/L 3.5 3.9 3.5  Chloride 98 - 111 mmol/L 101 103 99  CO2 22 - 32 mmol/L _1 Calcium 8.9 - 10.3 mg/dL 8.5(L) 8.4(L) 8.1(L)  Total Protein 6.5 - 8.1 g/dL 7.0 7.1 7.2  Total Bilirubin 0.3 - 1.2 mg/dL 0.7 0.4 0.5  Alkaline Phos 38 - 126 U/L 96 100 106  AST 15 - 41 U/L 37 43(H) 38  ALT 0 - 44 U/L _2 DIAGNOSTIC IMAGING:  I have independently reviewed the scans and discussed with the patient. No results found.   ASSESSMENT:  1.  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  Right breast upper outer quadrant biopsy was also positive for malignancy. -MRI of the breast on 12/10/2019 shows left breast malignancy involving all 4 quadrants and skin of the left breast including 0.5 cm biopsy-proven malignancy within the far posterior outer right breast, abnormal highly suspicious nonmuscle like enhancement within all 4 quadrants of the left breast and diffuse skin thickening.  This abnormal non-mass-like enhancement and skin thickening extends across the midline to the  medial right breast.  At least 7 abnormal left axillary lymph nodes and 1 abnormal right axillary lymph node compatible with metastatic disease. -CT CAP on 12/12/2019 shows bulky lymphadenopathy in the left subpectoral and axillary regions with largest lymph node measuring 3.3 cm.  Mild mediastinal adenopathy in the right paratracheal prevascular and lateral aortic regions, largest lymph node in the lateral aortic region measuring 2.4 cm.  Lymphadenopathy in the inferior jugular and supraclavicular regions, left side greater than right.  Mild lymphadenopathy in the right axilla. -Multiple small hypervascular lesions seen in the right and left lobes consistent with liver metastasis. -Bone scan on 12/12/2019 did not show any bone metastasis. -CT  CAP on 12/29/2019 showed possible lytic lesion in the T4 vertebral body. -Liver biopsy on 12/28/2019 consistent with metastatic breast cancer. -Paclitaxel weekly 3 weeks on 1 week off started on 12/31/2019. -CT CAP on 03/20/2020 shows mild decrease in the left axillary, subpectoral, supraclavicular, mediastinal and lower jugular lymphadenopathy.  Stable small low-attenuation liver lesion.  No new progressive metastatic disease.  Stable lesion in the left sternal manubrium and T4 vertebral body.  Stable short segment concentric wall thickening of the right colon with subcentimeter pericolonic lymph nodes.  We will monitor this finding as she has metastatic breast cancer.  She is asymptomatic. -CT CAP on 06/18/1998 6:22 cycles of chemotherapy showed no new or progressive metastatic disease. Similar size of the index and nonindex left axillary subpectoral, supraclavicular and lower jugular lymphadenopathy. - 3 cycles of Doxil from 10/02/2020 through 12/02/2020.   2.  Right colonic mass: -CT scan showed constricting mass involving the ascending colon measuring 3.6 x 3.5 cm with a small 1 cm right pericolonic lymph node. -She never had a colonoscopy.  Denies any bleeding per rectum or melena.   3.  Left leg DVT: -Doppler on 03/03/2020 shows left femoral vein, popliteal vein and posterior tibial vein deep vein thrombosis.   PLAN:  1.  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 - She  had to receive G-CSF for day 15 of cycle 2. - She was able to take Cipro only for 2 days as it caused palpitations and also caused her "chest feel funny".  She still has some dysuria.  Will give Macrobid 100 mg twice daily for 5 days. - I have reviewed her labs today.  White count is 3.2 and hemoglobin is 8.1.  Platelet count is 158.  MCV is 88. - We will check ferritin and iron panel for normocytic anemia. - She will proceed with cycle 3 today, 3 weeks on/1 week off. -  RTC 4 weeks with tumor markers and CT CAP prior to next visit.   2.  Right colonic adenocarcinoma: - Last CT scan showed slight worsening of right colon mass and adenopathy.  We are holding off on surgical management of colon cancer because of her advanced breast cancer.   3. Peripheral neuropathy: -Numbness in the feet is stable.  No neuropathic pains.   4.  Left leg DVT: - Continue Eliquis twice daily.   5.  Postmenopausal bleeding: - She does not report seeing any blood in the urine anymore.  6.  Left arm pain: - This has completely improved since the start of carboplatin and paclitaxel.    Orders placed this encounter:  No orders of the defined types were placed in this encounter.    [name], MD [place] [phone]   I, Thana Ates, am acting as a scribe for Dr. [name].  I, [name] MD, have reviewed the above documentation for accuracy and completeness, and I agree with the above.

## 2021-03-17 ENCOUNTER — Inpatient Hospital Stay (HOSPITAL_COMMUNITY): Payer: Medicare HMO

## 2021-03-17 ENCOUNTER — Other Ambulatory Visit: Payer: Self-pay

## 2021-03-17 ENCOUNTER — Inpatient Hospital Stay (HOSPITAL_COMMUNITY): Payer: Medicare HMO | Admitting: Hematology

## 2021-03-17 VITALS — BP 111/66 | HR 118 | Temp 96.7°F | Resp 20 | Wt 260.6 lb

## 2021-03-17 VITALS — BP 104/74 | HR 93 | Temp 96.6°F | Resp 18

## 2021-03-17 DIAGNOSIS — Z170 Estrogen receptor positive status [ER+]: Secondary | ICD-10-CM

## 2021-03-17 DIAGNOSIS — C50812 Malignant neoplasm of overlapping sites of left female breast: Secondary | ICD-10-CM | POA: Diagnosis not present

## 2021-03-17 DIAGNOSIS — Z95828 Presence of other vascular implants and grafts: Secondary | ICD-10-CM

## 2021-03-17 DIAGNOSIS — D509 Iron deficiency anemia, unspecified: Secondary | ICD-10-CM | POA: Diagnosis not present

## 2021-03-17 DIAGNOSIS — Z5111 Encounter for antineoplastic chemotherapy: Secondary | ICD-10-CM | POA: Diagnosis not present

## 2021-03-17 DIAGNOSIS — C9000 Multiple myeloma not having achieved remission: Secondary | ICD-10-CM

## 2021-03-17 LAB — CBC WITH DIFFERENTIAL/PLATELET
Abs Immature Granulocytes: 0.04 10*3/uL (ref 0.00–0.07)
Basophils Absolute: 0 10*3/uL (ref 0.0–0.1)
Basophils Relative: 1 %
Eosinophils Absolute: 0 10*3/uL (ref 0.0–0.5)
Eosinophils Relative: 0 %
HCT: 26.3 % — ABNORMAL LOW (ref 36.0–46.0)
Hemoglobin: 8.1 g/dL — ABNORMAL LOW (ref 12.0–15.0)
Immature Granulocytes: 1 %
Lymphocytes Relative: 35 %
Lymphs Abs: 1.1 10*3/uL (ref 0.7–4.0)
MCH: 27.1 pg (ref 26.0–34.0)
MCHC: 30.8 g/dL (ref 30.0–36.0)
MCV: 88 fL (ref 80.0–100.0)
Monocytes Absolute: 0.7 10*3/uL (ref 0.1–1.0)
Monocytes Relative: 21 %
Neutro Abs: 1.3 10*3/uL — ABNORMAL LOW (ref 1.7–7.7)
Neutrophils Relative %: 42 %
Platelets: 158 10*3/uL (ref 150–400)
RBC: 2.99 MIL/uL — ABNORMAL LOW (ref 3.87–5.11)
RDW: 24.4 % — ABNORMAL HIGH (ref 11.5–15.5)
Smear Review: NORMAL
WBC: 3.2 10*3/uL — ABNORMAL LOW (ref 4.0–10.5)
nRBC: 0 % (ref 0.0–0.2)

## 2021-03-17 LAB — COMPREHENSIVE METABOLIC PANEL
ALT: 15 U/L (ref 0–44)
AST: 29 U/L (ref 15–41)
Albumin: 2.9 g/dL — ABNORMAL LOW (ref 3.5–5.0)
Alkaline Phosphatase: 86 U/L (ref 38–126)
Anion gap: 7 (ref 5–15)
BUN: 16 mg/dL (ref 8–23)
CO2: 25 mmol/L (ref 22–32)
Calcium: 8.3 mg/dL — ABNORMAL LOW (ref 8.9–10.3)
Chloride: 102 mmol/L (ref 98–111)
Creatinine, Ser: 0.65 mg/dL (ref 0.44–1.00)
GFR, Estimated: >60 mL/min (ref >60)
Glucose, Bld: 132 mg/dL — ABNORMAL HIGH (ref 70–99)
Potassium: 3.5 mmol/L (ref 3.5–5.1)
Sodium: 134 mmol/L — ABNORMAL LOW (ref 135–145)
Total Bilirubin: 0.2 mg/dL — ABNORMAL LOW (ref 0.3–1.2)
Total Protein: 6.8 g/dL (ref 6.5–8.1)

## 2021-03-17 MED ORDER — SODIUM CHLORIDE 0.9 % IV SOLN
64.0000 mg/m2 | Freq: Once | INTRAVENOUS | Status: AC
  Administered 2021-03-17: 150 mg via INTRAVENOUS
  Filled 2021-03-17: qty 25

## 2021-03-17 MED ORDER — DIPHENHYDRAMINE HCL 50 MG/ML IJ SOLN
50.0000 mg | Freq: Once | INTRAMUSCULAR | Status: AC
  Administered 2021-03-17: 50 mg via INTRAVENOUS
  Filled 2021-03-17: qty 1

## 2021-03-17 MED ORDER — PALONOSETRON HCL INJECTION 0.25 MG/5ML
0.2500 mg | Freq: Once | INTRAVENOUS | Status: AC
  Administered 2021-03-17: 0.25 mg via INTRAVENOUS
  Filled 2021-03-17: qty 5

## 2021-03-17 MED ORDER — SODIUM CHLORIDE 0.9 % IV SOLN: Freq: Once | INTRAVENOUS | Status: AC

## 2021-03-17 MED ORDER — HEPARIN SOD (PORK) LOCK FLUSH 100 UNIT/ML IV SOLN
500.0000 [IU] | Freq: Once | INTRAVENOUS | Status: AC | PRN
  Administered 2021-03-17: 500 [IU]

## 2021-03-17 MED ORDER — NITROFURANTOIN MONOHYD MACRO 100 MG PO CAPS: 100.0000 mg | ORAL_CAPSULE | Freq: Two times a day (BID) | ORAL | 0 refills | Status: DC

## 2021-03-17 MED ORDER — FAMOTIDINE 20 MG IN NS 100 ML IVPB
20.0000 mg | Freq: Once | INTRAVENOUS | Status: AC
  Administered 2021-03-17: 20 mg via INTRAVENOUS
  Filled 2021-03-17: qty 20

## 2021-03-17 MED ORDER — SODIUM CHLORIDE 0.9 % IV SOLN
10.0000 mg | Freq: Once | INTRAVENOUS | Status: AC
  Administered 2021-03-17: 10 mg via INTRAVENOUS
  Filled 2021-03-17: qty 10

## 2021-03-17 MED ORDER — SODIUM CHLORIDE 0.9% FLUSH
10.0000 mL | INTRAVENOUS | Status: DC | PRN
  Administered 2021-03-17: 10 mL

## 2021-03-17 MED ORDER — CARBOPLATIN CHEMO INJECTION 450 MG/45ML
200.0000 mg | Freq: Once | INTRAVENOUS | Status: AC
  Administered 2021-03-17: 200 mg via INTRAVENOUS
  Filled 2021-03-17: qty 20

## 2021-03-17 NOTE — Patient Instructions (Signed)
[place] at [facility] Discharge Instructions  You were seen and examined by Dr. Delton Coombes today. You will proceed with treatment today. A new antibiotic, Macrobid, was sent to your pharmacy - take as directed. Return as scheduled for lab work and treatments.    Thank you for choosing [place] at [facility] to provide your oncology and hematology care.  To afford each patient quality time with our provider, please arrive at least 15 minutes before your scheduled appointment time.   If you have a lab appointment with the [place] [facility] please come in thru the Main Entrance and check in at the main information desk.  You need to re-schedule your appointment should you arrive 10 or more minutes late.  We strive to give you quality time with our providers, and arriving late affects you and other patients whose appointments are after yours.  Also, if you no show three or more times for appointments you may be dismissed from the clinic at the providers discretion.     Again, thank you for choosing [facility].  Our hope is that these requests will decrease the amount of time that you wait before being seen by our physicians.       _____________________________________________________________  Should you have questions after your visit to [facility], please contact our office at [phone] and follow the prompts.  Our office hours are 8:00 a.m. and 4:30 p.m. Monday - Friday.  Please note that voicemails left after 4:00 p.m. may not be returned until the following business day.  We are closed weekends and major holidays.  You do have access to a nurse 24-7, just call the main number to the clinic [phone] and do not press any options, hold on the line and a nurse will answer the phone.    For prescription refill requests, have your pharmacy contact our office and allow 72 hours.    Due to Covid, you will need to wear  a mask upon entering the hospital. If you do not have a mask, a mask will be given to you at the Main Entrance upon arrival. For doctor visits, patients may have 1 support person age 46 or older with them. For treatment visits, patients can not have anyone with them due to social distancing guidelines and our immunocompromised population.

## 2021-03-17 NOTE — Progress Notes (Signed)
Chaplain engaged in a follow-up visit with Crystal Vega.  She shared about her birthday meal from about two weeks ago.  Her daughter was able to make a feast of some of her favorite dishes.  Chaplain, Jeralyn and another patient present in the room were able to talk about various topics, and pieces of their childhood.  Crystal Vega also likes watching the The Price is Right and Let's Make a Deal.    Chaplain offered presence, listening, and support.  Chaplain will continue to follow-up.    03/17/21 1300  Clinical Encounter Type  Visited With Patient  Visit Type Follow-up

## 2021-03-17 NOTE — Progress Notes (Signed)
Received orders to cap Carboplatin dose at 200 mg.  T.O. Dr Rhys Martini, PharmD

## 2021-03-17 NOTE — Progress Notes (Signed)
Labs reviewed with MD. ANC is 1.3, ok to treat per MD.   Treatment given per orders. Patient tolerated it well without problems. Vitals stable and discharged home from clinic via wheelchair. Follow up as scheduled.

## 2021-03-17 NOTE — Progress Notes (Signed)
Patients port flushed without difficulty.  Good blood return noted with no bruising or swelling noted at site.  Stable during access and blood draw.  Patient to remain accessed for treatment. 

## 2021-03-17 NOTE — Patient Instructions (Signed)
[facility]  Discharge Instructions: Thank you for choosing [facility] to provide your oncology and hematology care.  If you have a lab appointment with the Cancer Center, please come in thru the Main Entrance and check in at the main information desk.  Wear comfortable clothing and clothing appropriate for easy access to any Portacath or PICC line.   We strive to give you quality time with your provider. You may need to reschedule your appointment if you arrive late (15 or more minutes).  Arriving late affects you and other patients whose appointments are after yours.  Also, if you miss three or more appointments without notifying the office, you may be dismissed from the clinic at the provider's discretion.      For prescription refill requests, have your pharmacy contact our office and allow 72 hours for refills to be completed.        To help prevent nausea and vomiting after your treatment, we encourage you to take your nausea medication as directed.  BELOW ARE SYMPTOMS THAT SHOULD BE REPORTED IMMEDIATELY: *FEVER GREATER THAN 100.4 F (38 C) OR HIGHER *CHILLS OR SWEATING *NAUSEA AND VOMITING THAT IS NOT CONTROLLED WITH YOUR NAUSEA MEDICATION *UNUSUAL SHORTNESS OF BREATH *UNUSUAL BRUISING OR BLEEDING *URINARY PROBLEMS (pain or burning when urinating, or frequent urination) *BOWEL PROBLEMS (unusual diarrhea, constipation, pain near the anus) TENDERNESS IN MOUTH AND THROAT WITH OR WITHOUT PRESENCE OF ULCERS (sore throat, sores in mouth, or a toothache) UNUSUAL RASH, SWELLING OR PAIN  UNUSUAL VAGINAL DISCHARGE OR ITCHING   Items with * indicate a potential emergency and should be followed up as soon as possible or go to the Emergency Department if any problems should occur.  Please show the CHEMOTHERAPY ALERT CARD or IMMUNOTHERAPY ALERT CARD at check-in to the Emergency Department and triage nurse.  Should you have questions after your visit or need to cancel  or reschedule your appointment, please contact [facility] [phone]  and follow the prompts.  Office hours are 8:00 a.m. to 4:30 p.m. Monday - Friday. Please note that voicemails left after 4:00 p.m. may not be returned until the following business day.  We are closed weekends and major holidays. You have access to a nurse at all times for urgent questions. Please call the main number to the clinic [phone] and follow the prompts.  For any non-urgent questions, you may also contact your provider using MyChart. We now offer e-Visits for anyone 18 and older to request care online for non-urgent symptoms. For details visit [email or web address].   Also download the MyChart app! Go to the app store, search "MyChart", open the app, select [place], and log in with your MyChart username and password.  Due to Covid, a mask is required upon entering the hospital/clinic. If you do not have a mask, one will be given to you upon arrival. For doctor visits, patients may have 1 support person aged 18 or older with them. For treatment visits, patients cannot have anyone with them due to current Covid guidelines and our immunocompromised population.  

## 2021-03-17 NOTE — Progress Notes (Signed)
Patient has been examined, vital signs and labs have been reviewed by Dr. Katragadda. ANC, Creatinine, LFTs, hemoglobin, and platelets are within treatment parameters per Dr. Katragadda. Patient may proceed with treatment per M.D.   

## 2021-03-18 ENCOUNTER — Encounter (HOSPITAL_COMMUNITY): Payer: Self-pay | Admitting: Hematology

## 2021-03-24 ENCOUNTER — Other Ambulatory Visit: Payer: Self-pay

## 2021-03-24 ENCOUNTER — Inpatient Hospital Stay (HOSPITAL_COMMUNITY): Payer: Medicare HMO

## 2021-03-24 VITALS — BP 124/88 | HR 87 | Temp 98.4°F | Resp 20

## 2021-03-24 DIAGNOSIS — Z95828 Presence of other vascular implants and grafts: Secondary | ICD-10-CM

## 2021-03-24 DIAGNOSIS — C50812 Malignant neoplasm of overlapping sites of left female breast: Secondary | ICD-10-CM

## 2021-03-24 DIAGNOSIS — D509 Iron deficiency anemia, unspecified: Secondary | ICD-10-CM

## 2021-03-24 DIAGNOSIS — Z170 Estrogen receptor positive status [ER+]: Secondary | ICD-10-CM

## 2021-03-24 DIAGNOSIS — Z5111 Encounter for antineoplastic chemotherapy: Secondary | ICD-10-CM | POA: Diagnosis not present

## 2021-03-24 DIAGNOSIS — C9000 Multiple myeloma not having achieved remission: Secondary | ICD-10-CM

## 2021-03-24 LAB — CBC WITH DIFFERENTIAL/PLATELET
Abs Immature Granulocytes: 0.01 10*3/uL (ref 0.00–0.07)
Basophils Absolute: 0 10*3/uL (ref 0.0–0.1)
Basophils Relative: 1 %
Eosinophils Absolute: 0 10*3/uL (ref 0.0–0.5)
Eosinophils Relative: 0 %
HCT: 23.1 % — ABNORMAL LOW (ref 36.0–46.0)
Hemoglobin: 7.3 g/dL — ABNORMAL LOW (ref 12.0–15.0)
Immature Granulocytes: 0 %
Lymphocytes Relative: 29 %
Lymphs Abs: 0.8 10*3/uL (ref 0.7–4.0)
MCH: 28.2 pg (ref 26.0–34.0)
MCHC: 31.6 g/dL (ref 30.0–36.0)
MCV: 89.2 fL (ref 80.0–100.0)
Monocytes Absolute: 0.2 10*3/uL (ref 0.1–1.0)
Monocytes Relative: 8 %
Neutro Abs: 1.8 10*3/uL (ref 1.7–7.7)
Neutrophils Relative %: 62 %
Platelets: 120 10*3/uL — ABNORMAL LOW (ref 150–400)
RBC: 2.59 MIL/uL — ABNORMAL LOW (ref 3.87–5.11)
RDW: 22.9 % — ABNORMAL HIGH (ref 11.5–15.5)
WBC: 2.9 10*3/uL — ABNORMAL LOW (ref 4.0–10.5)
nRBC: 0 % (ref 0.0–0.2)

## 2021-03-24 LAB — COMPREHENSIVE METABOLIC PANEL
ALT: 15 U/L (ref 0–44)
AST: 32 U/L (ref 15–41)
Albumin: 3 g/dL — ABNORMAL LOW (ref 3.5–5.0)
Alkaline Phosphatase: 88 U/L (ref 38–126)
Anion gap: 7 (ref 5–15)
BUN: 14 mg/dL (ref 8–23)
CO2: 25 mmol/L (ref 22–32)
Calcium: 8 mg/dL — ABNORMAL LOW (ref 8.9–10.3)
Chloride: 103 mmol/L (ref 98–111)
Creatinine, Ser: 0.56 mg/dL (ref 0.44–1.00)
GFR, Estimated: >60 mL/min (ref >60)
Glucose, Bld: 117 mg/dL — ABNORMAL HIGH (ref 70–99)
Potassium: 4 mmol/L (ref 3.5–5.1)
Sodium: 135 mmol/L (ref 135–145)
Total Bilirubin: 0.5 mg/dL (ref 0.3–1.2)
Total Protein: 6.9 g/dL (ref 6.5–8.1)

## 2021-03-24 LAB — IRON AND TIBC
Iron: 34 ug/dL (ref 28–170)
Saturation Ratios: 10 % — ABNORMAL LOW (ref 10.4–31.8)
TIBC: 340 ug/dL (ref 250–450)
UIBC: 306 ug/dL

## 2021-03-24 LAB — FERRITIN: Ferritin: 218 ng/mL (ref 11–307)

## 2021-03-24 LAB — MAGNESIUM: Magnesium: 1.4 mg/dL — ABNORMAL LOW (ref 1.7–2.4)

## 2021-03-24 MED ORDER — DIPHENHYDRAMINE HCL 50 MG/ML IJ SOLN
50.0000 mg | Freq: Once | INTRAMUSCULAR | Status: AC
  Administered 2021-03-24: 50 mg via INTRAVENOUS
  Filled 2021-03-24: qty 1

## 2021-03-24 MED ORDER — FAMOTIDINE 20 MG IN NS 100 ML IVPB
20.0000 mg | Freq: Once | INTRAVENOUS | Status: AC
  Administered 2021-03-24: 20 mg via INTRAVENOUS
  Filled 2021-03-24: qty 20

## 2021-03-24 MED ORDER — SODIUM CHLORIDE 0.9 % IV SOLN: Freq: Once | INTRAVENOUS | Status: AC

## 2021-03-24 MED ORDER — SODIUM CHLORIDE 0.9 % IV SOLN
200.0000 mg | Freq: Once | INTRAVENOUS | Status: AC
  Administered 2021-03-24: 200 mg via INTRAVENOUS
  Filled 2021-03-24: qty 20

## 2021-03-24 MED ORDER — SODIUM CHLORIDE 0.9 % IV SOLN
64.0000 mg/m2 | Freq: Once | INTRAVENOUS | Status: AC
  Administered 2021-03-24: 150 mg via INTRAVENOUS
  Filled 2021-03-24: qty 25

## 2021-03-24 MED ORDER — PALONOSETRON HCL INJECTION 0.25 MG/5ML
0.2500 mg | Freq: Once | INTRAVENOUS | Status: AC
  Administered 2021-03-24: 0.25 mg via INTRAVENOUS
  Filled 2021-03-24: qty 5

## 2021-03-24 MED ORDER — MAGNESIUM SULFATE 2 GM/50ML IV SOLN
2.0000 g | Freq: Once | INTRAVENOUS | Status: AC
  Administered 2021-03-24: 2 g via INTRAVENOUS
  Filled 2021-03-24: qty 50

## 2021-03-24 MED ORDER — SODIUM CHLORIDE 0.9% FLUSH
10.0000 mL | INTRAVENOUS | Status: DC | PRN
  Administered 2021-03-24: 10 mL

## 2021-03-24 MED ORDER — HEPARIN SOD (PORK) LOCK FLUSH 100 UNIT/ML IV SOLN
500.0000 [IU] | Freq: Once | INTRAVENOUS | Status: AC | PRN
  Administered 2021-03-24: 500 [IU]

## 2021-03-24 MED ORDER — SODIUM CHLORIDE 0.9 % IV SOLN
10.0000 mg | Freq: Once | INTRAVENOUS | Status: AC
  Administered 2021-03-24: 10 mg via INTRAVENOUS
  Filled 2021-03-24: qty 10

## 2021-03-24 NOTE — Patient Instructions (Signed)
[facility]  Discharge Instructions: Thank you for choosing [facility] to provide your oncology and hematology care.  If you have a lab appointment with the Cancer Center, please come in thru the Main Entrance and check in at the main information desk.  Wear comfortable clothing and clothing appropriate for easy access to any Portacath or PICC line.   We strive to give you quality time with your provider. You may need to reschedule your appointment if you arrive late (15 or more minutes).  Arriving late affects you and other patients whose appointments are after yours.  Also, if you miss three or more appointments without notifying the office, you may be dismissed from the clinic at the provider's discretion.      For prescription refill requests, have your pharmacy contact our office and allow 72 hours for refills to be completed.    Today you received the following chemotherapy and/or immunotherapy agents Taxol/Carboplatin.       To help prevent nausea and vomiting after your treatment, we encourage you to take your nausea medication as directed.  BELOW ARE SYMPTOMS THAT SHOULD BE REPORTED IMMEDIATELY: *FEVER GREATER THAN 100.4 F (38 C) OR HIGHER *CHILLS OR SWEATING *NAUSEA AND VOMITING THAT IS NOT CONTROLLED WITH YOUR NAUSEA MEDICATION *UNUSUAL SHORTNESS OF BREATH *UNUSUAL BRUISING OR BLEEDING *URINARY PROBLEMS (pain or burning when urinating, or frequent urination) *BOWEL PROBLEMS (unusual diarrhea, constipation, pain near the anus) TENDERNESS IN MOUTH AND THROAT WITH OR WITHOUT PRESENCE OF ULCERS (sore throat, sores in mouth, or a toothache) UNUSUAL RASH, SWELLING OR PAIN  UNUSUAL VAGINAL DISCHARGE OR ITCHING   Items with * indicate a potential emergency and should be followed up as soon as possible or go to the Emergency Department if any problems should occur.  Please show the CHEMOTHERAPY ALERT CARD or IMMUNOTHERAPY ALERT CARD at check-in to the  Emergency Department and triage nurse.  Should you have questions after your visit or need to cancel or reschedule your appointment, please contact [facility] [phone]  and follow the prompts.  Office hours are 8:00 a.m. to 4:30 p.m. Monday - Friday. Please note that voicemails left after 4:00 p.m. may not be returned until the following business day.  We are closed weekends and major holidays. You have access to a nurse at all times for urgent questions. Please call the main number to the clinic [phone] and follow the prompts.  For any non-urgent questions, you may also contact your provider using MyChart. We now offer e-Visits for anyone 18 and older to request care online for non-urgent symptoms. For details visit [email or web address].   Also download the MyChart app! Go to the app store, search "MyChart", open the app, select [facility], and log in with your MyChart username and password.  Due to Covid, a mask is required upon entering the hospital/clinic. If you do not have a mask, one will be given to you upon arrival. For doctor visits, patients may have 1 support person aged 18 or older with them. For treatment visits, patients cannot have anyone with them due to current Covid guidelines and our immunocompromised population.  

## 2021-03-24 NOTE — Progress Notes (Signed)
Patient presents today for Taxol/Carboplatin infusion per providers order.  Vital signs within parameter for treatment.  Labs pending.  Patient has no new complaints at this time.  Labs reviewed and Hgb 7.3 and magnesium 1.4.  Patient is asymptomatic.  Provider notified.  Per Dr. Delton Coombes patient okay for treatment, will receive magnesium sulfate today as well.    Taxol/Carboplatin infusion given today per MD orders.  Stable during infusion without adverse affects.  Vital signs stable.  No complaints at this time.  Discharge from clinic via wheelchair in stable condition.  Alert and oriented X 3.  Follow up with [facility] as scheduled.

## 2021-03-24 NOTE — Progress Notes (Signed)
Chaplain engaged in follow-up visit with Crystal Vega.  She continues to be grateful that her arm is no longer hurting because of her new treatment plan.  She expressed that the only differences she has been experiencing is some pain in her fingers.  Crystal Vega also talked about losing her family's cat recently and the impact she has seen that have on her husband.    Chaplain and Crystal Vega spent time talking about family, tv shows and more.  Chaplain continues to offer presence, listening and support.     03/24/21 1000  Clinical Encounter Type  Visited With Patient  Visit Type Follow-up

## 2021-03-31 ENCOUNTER — Encounter (HOSPITAL_COMMUNITY): Payer: Self-pay

## 2021-03-31 ENCOUNTER — Inpatient Hospital Stay (HOSPITAL_COMMUNITY): Payer: Medicare HMO | Attending: Hematology

## 2021-03-31 ENCOUNTER — Other Ambulatory Visit: Payer: Self-pay

## 2021-03-31 ENCOUNTER — Inpatient Hospital Stay (HOSPITAL_COMMUNITY): Payer: Medicare HMO

## 2021-03-31 VITALS — BP 94/59 | HR 99 | Temp 98.3°F | Resp 20 | Wt 256.4 lb

## 2021-03-31 DIAGNOSIS — Z5189 Encounter for other specified aftercare: Secondary | ICD-10-CM | POA: Diagnosis not present

## 2021-03-31 DIAGNOSIS — Z5111 Encounter for antineoplastic chemotherapy: Secondary | ICD-10-CM | POA: Insufficient documentation

## 2021-03-31 DIAGNOSIS — C50611 Malignant neoplasm of axillary tail of right female breast: Secondary | ICD-10-CM | POA: Insufficient documentation

## 2021-03-31 DIAGNOSIS — C50812 Malignant neoplasm of overlapping sites of left female breast: Secondary | ICD-10-CM | POA: Insufficient documentation

## 2021-03-31 DIAGNOSIS — Z170 Estrogen receptor positive status [ER+]: Secondary | ICD-10-CM

## 2021-03-31 DIAGNOSIS — D702 Other drug-induced agranulocytosis: Secondary | ICD-10-CM

## 2021-03-31 DIAGNOSIS — D509 Iron deficiency anemia, unspecified: Secondary | ICD-10-CM

## 2021-03-31 DIAGNOSIS — D701 Agranulocytosis secondary to cancer chemotherapy: Secondary | ICD-10-CM | POA: Diagnosis not present

## 2021-03-31 LAB — CBC WITH DIFFERENTIAL/PLATELET
Abs Immature Granulocytes: 0.02 10*3/uL (ref 0.00–0.07)
Basophils Absolute: 0 10*3/uL (ref 0.0–0.1)
Basophils Relative: 1 %
Eosinophils Absolute: 0 10*3/uL (ref 0.0–0.5)
Eosinophils Relative: 1 %
HCT: 23.5 % — ABNORMAL LOW (ref 36.0–46.0)
Hemoglobin: 7.5 g/dL — ABNORMAL LOW (ref 12.0–15.0)
Immature Granulocytes: 1 %
Lymphocytes Relative: 43 %
Lymphs Abs: 0.8 10*3/uL (ref 0.7–4.0)
MCH: 28.8 pg (ref 26.0–34.0)
MCHC: 31.9 g/dL (ref 30.0–36.0)
MCV: 90.4 fL (ref 80.0–100.0)
Monocytes Absolute: 0.2 10*3/uL (ref 0.1–1.0)
Monocytes Relative: 11 %
Neutro Abs: 0.8 10*3/uL — ABNORMAL LOW (ref 1.7–7.7)
Neutrophils Relative %: 43 %
Platelets: 179 10*3/uL (ref 150–400)
RBC: 2.6 MIL/uL — ABNORMAL LOW (ref 3.87–5.11)
RDW: 23.2 % — ABNORMAL HIGH (ref 11.5–15.5)
Smear Review: ADEQUATE
WBC: 1.9 10*3/uL — ABNORMAL LOW (ref 4.0–10.5)
nRBC: 1.5 % — ABNORMAL HIGH (ref 0.0–0.2)

## 2021-03-31 LAB — COMPREHENSIVE METABOLIC PANEL
ALT: 18 U/L (ref 0–44)
AST: 35 U/L (ref 15–41)
Albumin: 3.2 g/dL — ABNORMAL LOW (ref 3.5–5.0)
Alkaline Phosphatase: 88 U/L (ref 38–126)
Anion gap: 6 (ref 5–15)
BUN: 26 mg/dL — ABNORMAL HIGH (ref 8–23)
CO2: 27 mmol/L (ref 22–32)
Calcium: 8.4 mg/dL — ABNORMAL LOW (ref 8.9–10.3)
Chloride: 101 mmol/L (ref 98–111)
Creatinine, Ser: 0.74 mg/dL (ref 0.44–1.00)
GFR, Estimated: >60 mL/min (ref >60)
Glucose, Bld: 135 mg/dL — ABNORMAL HIGH (ref 70–99)
Potassium: 3.9 mmol/L (ref 3.5–5.1)
Sodium: 134 mmol/L — ABNORMAL LOW (ref 135–145)
Total Bilirubin: 0.5 mg/dL (ref 0.3–1.2)
Total Protein: 7.1 g/dL (ref 6.5–8.1)

## 2021-03-31 LAB — MAGNESIUM: Magnesium: 1.4 mg/dL — ABNORMAL LOW (ref 1.7–2.4)

## 2021-03-31 MED ORDER — FILGRASTIM-SNDZ 480 MCG/0.8ML IJ SOSY
480.0000 ug | PREFILLED_SYRINGE | Freq: Once | INTRAMUSCULAR | Status: AC
  Administered 2021-03-31: 480 ug via SUBCUTANEOUS
  Filled 2021-03-31: qty 0.8

## 2021-03-31 MED ORDER — SODIUM CHLORIDE 0.9% FLUSH
10.0000 mL | INTRAVENOUS | Status: DC | PRN
Start: 2021-03-31 — End: 2021-03-31
  Administered 2021-03-31: 10 mL via INTRAVENOUS

## 2021-03-31 MED ORDER — HEPARIN SOD (PORK) LOCK FLUSH 100 UNIT/ML IV SOLN
500.0000 [IU] | Freq: Once | INTRAVENOUS | Status: AC
  Administered 2021-03-31: 500 [IU] via INTRAVENOUS

## 2021-03-31 NOTE — Progress Notes (Signed)
Patients port flushed without difficulty.  Blood return noted with no bruising or swelling noted at site.  Stable during access and blood draw.  Patient to remain accessed for treatment

## 2021-03-31 NOTE — Progress Notes (Addendum)
Pt here for taxol/carbo. Complaining of fingernail tips being sore and turning red in color. Hemoglobin 7.5 but pt is asymptomatic.  WBC 1.9, ANC 0.8, magnesium 1.4.  Dr Raliegh Ip notified and orders received to hold chemotherapy today and to give zarxio 480 mcg today.  Return on Thursday for CBC recheck and chemotherapy.   When port flush to de-access blood return noted. Flushed with 76ml heparin and 20 ml NS.   Crystal Vega presents today for injection per the provider's orders.   Zarxio 480 mcg in abdomen administration without incident; injection site WNL; see MAR for injection details.  Patient tolerated procedure well and without incident.  No questions or complaints noted at this time. Stable during and after injection. AVS reviewed. Discharged in stable condition via wheelchair. Vital signs stable.

## 2021-03-31 NOTE — Patient Instructions (Signed)
[facility]  Discharge Instructions: Thank you for choosing [facility] to provide your oncology and hematology care.  If you have a lab appointment with the [facility], please come in thru the Main Entrance and check in at the main information desk.  Wear comfortable clothing and clothing appropriate for easy access to any Portacath or PICC line.   We strive to give you quality time with your provider. You may need to reschedule your appointment if you arrive late (15 or more minutes).  Arriving late affects you and other patients whose appointments are after yours.  Also, if you miss three or more appointments without notifying the office, you may be dismissed from the clinic at the provider's discretion.      For prescription refill requests, have your pharmacy contact our office and allow 72 hours for refills to be completed.    Today you received the following chemotherapy and/or immunotherapy agents zarxio      To help prevent nausea and vomiting after your treatment, we encourage you to take your nausea medication as directed.  BELOW ARE SYMPTOMS THAT SHOULD BE REPORTED IMMEDIATELY: *FEVER GREATER THAN 100.4 F (38 C) OR HIGHER *CHILLS OR SWEATING *NAUSEA AND VOMITING THAT IS NOT CONTROLLED WITH YOUR NAUSEA MEDICATION *UNUSUAL SHORTNESS OF BREATH *UNUSUAL BRUISING OR BLEEDING *URINARY PROBLEMS (pain or burning when urinating, or frequent urination) *BOWEL PROBLEMS (unusual diarrhea, constipation, pain near the anus) TENDERNESS IN MOUTH AND THROAT WITH OR WITHOUT PRESENCE OF ULCERS (sore throat, sores in mouth, or a toothache) UNUSUAL RASH, SWELLING OR PAIN  UNUSUAL VAGINAL DISCHARGE OR ITCHING   Items with * indicate a potential emergency and should be followed up as soon as possible or go to the Emergency Department if any problems should occur.  Please show the CHEMOTHERAPY ALERT CARD or IMMUNOTHERAPY ALERT CARD at check-in to the Emergency  Department and triage nurse.  Should you have questions after your visit or need to cancel or reschedule your appointment, please contact [facility] [phone]  and follow the prompts.  Office hours are 8:00 a.m. to 4:30 p.m. Monday - Friday. Please note that voicemails left after 4:00 p.m. may not be returned until the following business day.  We are closed weekends and major holidays. You have access to a nurse at all times for urgent questions. Please call the main number to the clinic [phone] and follow the prompts.  For any non-urgent questions, you may also contact your provider using MyChart. We now offer e-Visits for anyone 69 and older to request care online for non-urgent symptoms. For details visit [email or web address].   Also download the MyChart app! Go to the app store, search "MyChart", open the app, select [facility], and log in with your MyChart username and password.  Due to Covid, a mask is required upon entering the hospital/clinic. If you do not have a mask, one will be given to you upon arrival. For doctor visits, patients may have 1 support person aged 38 or older with them. For treatment visits, patients cannot have anyone with them due to current Covid guidelines and our immunocompromised population.    Filgrastim, G-CSF injection What is this medication? FILGRASTIM, G-CSF (fil GRA stim) is a granulocyte colony-stimulating factor that stimulates the growth of neutrophils, a type of white blood cell (WBC) important in the body's fight against infection. It is used to reduce the incidence of fever and infection in patients with certain types of cancer who are  receiving chemotherapy that affects the bone marrow, to stimulate blood cell production for removal of WBCs from the body prior to a bone marrow transplantation, to reduce the incidence of fever and infection in patients who have severe chronic neutropenia, and to improve survival outcomes following  high-dose radiation exposure that is toxic to the bone marrow. This medicine may be used for other purposes; ask your health care provider or pharmacist if you have questions. COMMON BRAND NAME(S): Neupogen, Nivestym, Releuko, Zarxio What should I tell my care team before I take this medication? They need to know if you have any of these conditions: kidney disease latex allergy ongoing radiation therapy sickle cell disease an unusual or allergic reaction to filgrastim, pegfilgrastim, other medicines, foods, dyes, or preservatives pregnant or trying to get pregnant breast-feeding How should I use this medication? This medicine is for injection under the skin or infusion into a vein. As an infusion into a vein, it is usually given by a health care professional in a hospital or clinic setting. If you get this medicine at home, you will be taught how to prepare and give this medicine. Refer to the Instructions for Use that come with your medication packaging. Use exactly as directed. Take your medicine at regular intervals. Do not take your medicine more often than directed. It is important that you put your used needles and syringes in a special sharps container. Do not put them in a trash can. If you do not have a sharps container, call your pharmacist or healthcare provider to get one. Talk to your pediatrician regarding the use of this medicine in children. While this drug may be prescribed for children as young as 7 months for selected conditions, precautions do apply. Overdosage: If you think you have taken too much of this medicine contact a poison control center or emergency room at once. NOTE: This medicine is only for you. Do not share this medicine with others. What if I miss a dose? It is important not to miss your dose. Call your doctor or health care professional if you miss a dose. What may interact with this medication? This medicine may interact with the following  medications: medicines that may cause a release of neutrophils, such as lithium This list may not describe all possible interactions. Give your health care provider a list of all the medicines, herbs, non-prescription drugs, or dietary supplements you use. Also tell them if you smoke, drink alcohol, or use illegal drugs. Some items may interact with your medicine. What should I watch for while using this medication? Your condition will be monitored carefully while you are receiving this medicine. You may need blood work done while you are taking this medicine. Talk to your health care provider about your risk of cancer. You may be more at risk for certain types of cancer if you take this medicine. What side effects may I notice from receiving this medication? Side effects that you should report to your doctor or health care professional as soon as possible: allergic reactions like skin rash, itching or hives, swelling of the face, lips, or tongue back pain dizziness or feeling faint fever pain, redness, or irritation at site where injected pinpoint red spots on the skin shortness of breath or breathing problems signs and symptoms of kidney injury like trouble passing urine, change in the amount of urine, or red or dark-brown urine stomach or side pain, or pain at the shoulder swelling tiredness unusual bleeding or bruising Side effects that  usually do not require medical attention (report to your doctor or health care professional if they continue or are bothersome): bone pain cough diarrhea hair loss headache muscle pain This list may not describe all possible side effects. Call your doctor for medical advice about side effects. You may report side effects to FDA at 1-800-FDA-1088. Where should I keep my medication? Keep out of the reach of children. Store in a refrigerator between 2 and 8 degrees C (36 and 46 degrees F). Do not freeze. Keep in carton to protect from light. Throw away  this medicine if vials or syringes are left out of the refrigerator for more than 24 hours. Throw away any unused medicine after the expiration date. NOTE: This sheet is a summary. It may not cover all possible information. If you have questions about this medicine, talk to your doctor, pharmacist, or health care provider.  2022 Elsevier/Gold Standard (2019-06-07 18:47:55)

## 2021-04-01 LAB — CANCER ANTIGEN 27.29: CA 27.29: 71.9 U/mL — ABNORMAL HIGH (ref 0.0–38.6)

## 2021-04-01 LAB — CANCER ANTIGEN 15-3: CA 15-3: 73.4 U/mL — ABNORMAL HIGH (ref 0.0–25.0)

## 2021-04-02 ENCOUNTER — Inpatient Hospital Stay (HOSPITAL_COMMUNITY): Payer: Medicare HMO

## 2021-04-02 ENCOUNTER — Other Ambulatory Visit (HOSPITAL_COMMUNITY): Payer: Self-pay | Admitting: *Deleted

## 2021-04-02 ENCOUNTER — Other Ambulatory Visit: Payer: Self-pay

## 2021-04-02 VITALS — BP 118/81 | HR 100 | Temp 97.0°F | Resp 20

## 2021-04-02 DIAGNOSIS — Z95828 Presence of other vascular implants and grafts: Secondary | ICD-10-CM

## 2021-04-02 DIAGNOSIS — C9000 Multiple myeloma not having achieved remission: Secondary | ICD-10-CM

## 2021-04-02 DIAGNOSIS — C50812 Malignant neoplasm of overlapping sites of left female breast: Secondary | ICD-10-CM

## 2021-04-02 DIAGNOSIS — Z170 Estrogen receptor positive status [ER+]: Secondary | ICD-10-CM

## 2021-04-02 DIAGNOSIS — Z5111 Encounter for antineoplastic chemotherapy: Secondary | ICD-10-CM | POA: Diagnosis not present

## 2021-04-02 LAB — CBC WITH DIFFERENTIAL/PLATELET
Abs Immature Granulocytes: 0.41 K/uL — ABNORMAL HIGH (ref 0.00–0.07)
Basophils Absolute: 0.1 K/uL (ref 0.0–0.1)
Basophils Relative: 1 %
Eosinophils Absolute: 0 K/uL (ref 0.0–0.5)
Eosinophils Relative: 0 %
HCT: 24.5 % — ABNORMAL LOW (ref 36.0–46.0)
Hemoglobin: 7.8 g/dL — ABNORMAL LOW (ref 12.0–15.0)
Immature Granulocytes: 5 %
Lymphocytes Relative: 23 %
Lymphs Abs: 1.7 K/uL (ref 0.7–4.0)
MCH: 29 pg (ref 26.0–34.0)
MCHC: 31.8 g/dL (ref 30.0–36.0)
MCV: 91.1 fL (ref 80.0–100.0)
Monocytes Absolute: 1 K/uL (ref 0.1–1.0)
Monocytes Relative: 13 %
Neutro Abs: 4.4 K/uL (ref 1.7–7.7)
Neutrophils Relative %: 58 %
Platelets: 208 K/uL (ref 150–400)
RBC: 2.69 MIL/uL — ABNORMAL LOW (ref 3.87–5.11)
RDW: 25.1 % — ABNORMAL HIGH (ref 11.5–15.5)
Smear Review: NORMAL
WBC: 7.5 K/uL (ref 4.0–10.5)
nRBC: 4.4 % — ABNORMAL HIGH (ref 0.0–0.2)

## 2021-04-02 MED ORDER — SODIUM CHLORIDE 0.9 % IV SOLN: Freq: Once | INTRAVENOUS | Status: AC

## 2021-04-02 MED ORDER — FAMOTIDINE 20 MG IN NS 100 ML IVPB
20.0000 mg | Freq: Once | INTRAVENOUS | Status: AC
  Administered 2021-04-02: 20 mg via INTRAVENOUS
  Filled 2021-04-02: qty 20

## 2021-04-02 MED ORDER — HEPARIN SOD (PORK) LOCK FLUSH 100 UNIT/ML IV SOLN
500.0000 [IU] | Freq: Once | INTRAVENOUS | Status: AC | PRN
  Administered 2021-04-02: 500 [IU]

## 2021-04-02 MED ORDER — SODIUM CHLORIDE 0.9 % IV SOLN
200.0000 mg | Freq: Once | INTRAVENOUS | Status: AC
  Administered 2021-04-02: 200 mg via INTRAVENOUS
  Filled 2021-04-02: qty 20

## 2021-04-02 MED ORDER — SODIUM CHLORIDE 0.9 % IV SOLN
64.0000 mg/m2 | Freq: Once | INTRAVENOUS | Status: AC
  Administered 2021-04-02: 150 mg via INTRAVENOUS
  Filled 2021-04-02: qty 25

## 2021-04-02 MED ORDER — LIDOCAINE-PRILOCAINE 2.5-2.5 % EX CREA: 1.0000 "application " | TOPICAL_CREAM | CUTANEOUS | 3 refills | Status: AC | PRN

## 2021-04-02 MED ORDER — SODIUM CHLORIDE 0.9% FLUSH
10.0000 mL | INTRAVENOUS | Status: DC | PRN
  Administered 2021-04-02: 10 mL

## 2021-04-02 MED ORDER — DIPHENHYDRAMINE HCL 50 MG/ML IJ SOLN
50.0000 mg | Freq: Once | INTRAMUSCULAR | Status: AC
  Administered 2021-04-02: 50 mg via INTRAVENOUS
  Filled 2021-04-02: qty 1

## 2021-04-02 MED ORDER — SODIUM CHLORIDE 0.9 % IV SOLN
10.0000 mg | Freq: Once | INTRAVENOUS | Status: AC
  Administered 2021-04-02: 10 mg via INTRAVENOUS
  Filled 2021-04-02: qty 10

## 2021-04-02 MED ORDER — PALONOSETRON HCL INJECTION 0.25 MG/5ML
0.2500 mg | Freq: Once | INTRAVENOUS | Status: AC
  Administered 2021-04-02: 0.25 mg via INTRAVENOUS
  Filled 2021-04-02: qty 5

## 2021-04-02 NOTE — Patient Instructions (Signed)
[facility]  Discharge Instructions: Thank you for choosing [facility] to provide your oncology and hematology care.  If you have a lab appointment with the [facility], please come in thru the Main Entrance and check in at the main information desk.  Wear comfortable clothing and clothing appropriate for easy access to any Portacath or PICC line.   We strive to give you quality time with your provider. You may need to reschedule your appointment if you arrive late (15 or more minutes).  Arriving late affects you and other patients whose appointments are after yours.  Also, if you miss three or more appointments without notifying the office, you may be dismissed from the clinic at the provider's discretion.      For prescription refill requests, have your pharmacy contact our office and allow 72 hours for refills to be completed.    Today you received the following chemotherapy and/or immunotherapy agents Taxol/Carbo      To help prevent nausea and vomiting after your treatment, we encourage you to take your nausea medication as directed.  BELOW ARE SYMPTOMS THAT SHOULD BE REPORTED IMMEDIATELY: *FEVER GREATER THAN 100.4 F (38 C) OR HIGHER *CHILLS OR SWEATING *NAUSEA AND VOMITING THAT IS NOT CONTROLLED WITH YOUR NAUSEA MEDICATION *UNUSUAL SHORTNESS OF BREATH *UNUSUAL BRUISING OR BLEEDING *URINARY PROBLEMS (pain or burning when urinating, or frequent urination) *BOWEL PROBLEMS (unusual diarrhea, constipation, pain near the anus) TENDERNESS IN MOUTH AND THROAT WITH OR WITHOUT PRESENCE OF ULCERS (sore throat, sores in mouth, or a toothache) UNUSUAL RASH, SWELLING OR PAIN  UNUSUAL VAGINAL DISCHARGE OR ITCHING   Items with * indicate a potential emergency and should be followed up as soon as possible or go to the Emergency Department if any problems should occur.  Please show the CHEMOTHERAPY ALERT CARD or IMMUNOTHERAPY ALERT CARD at check-in to the Emergency  Department and triage nurse.  Should you have questions after your visit or need to cancel or reschedule your appointment, please contact [facility] [phone]  and follow the prompts.  Office hours are 8:00 a.m. to 4:30 p.m. Monday - Friday. Please note that voicemails left after 4:00 p.m. may not be returned until the following business day.  We are closed weekends and major holidays. You have access to a nurse at all times for urgent questions. Please call the main number to the clinic [phone] and follow the prompts.  For any non-urgent questions, you may also contact your provider using MyChart. We now offer e-Visits for anyone 39 and older to request care online for non-urgent symptoms. For details visit [email or web address].   Also download the MyChart app! Go to the app store, search "MyChart", open the app, select , and log in with your MyChart username and password.  Due to Covid, a mask is required upon entering the hospital/clinic. If you do not have a mask, one will be given to you upon arrival. For doctor visits, patients may have 1 support person aged 69 or older with them. For treatment visits, patients cannot have anyone with them due to current Covid guidelines and our immunocompromised population.

## 2021-04-02 NOTE — Progress Notes (Signed)
Patient presents today for Taxol/Carboplatin per providers order.  Vital signs within parameters for treatment.  Labs pending.  Patient has no new complaints at this time.  Hgb noted to be 7.8, MD notified.  Message received from A. Anderson RN/Dr. Delton Coombes okay for treatment.  Treatment given today per MD orders.  Tolerated infusion without adverse affects.  Vital signs stable.  No complaints at this time.  Discharge from clinic via wheelchair in stable condition.  Alert and oriented X 3.  Follow up with [facility] as scheduled.

## 2021-04-02 NOTE — Progress Notes (Signed)
Ok to treat with today's labs  Maintaining carboplatin dose at 200 mg  T.O. Dr Rhys Martini, PharmD

## 2021-04-09 ENCOUNTER — Other Ambulatory Visit: Payer: Self-pay

## 2021-04-09 ENCOUNTER — Ambulatory Visit (HOSPITAL_COMMUNITY)
Admission: RE | Admit: 2021-04-09 | Discharge: 2021-04-09 | Disposition: A | Discharge status: Home / Self Care | Payer: Medicare HMO | Source: Ambulatory Visit | Attending: Hematology | Admitting: Hematology

## 2021-04-09 DIAGNOSIS — C50812 Malignant neoplasm of overlapping sites of left female breast: Secondary | ICD-10-CM | POA: Insufficient documentation

## 2021-04-09 DIAGNOSIS — Z170 Estrogen receptor positive status [ER+]: Secondary | ICD-10-CM | POA: Diagnosis present

## 2021-04-09 MED ORDER — HEPARIN SOD (PORK) LOCK FLUSH 100 UNIT/ML IV SOLN
INTRAVENOUS | Status: AC
  Administered 2021-04-09: 500 [IU] via INTRAVENOUS
  Filled 2021-04-09: qty 5

## 2021-04-09 MED ORDER — IOHEXOL 300 MG/ML  SOLN
100.0000 mL | Freq: Once | INTRAMUSCULAR | Status: AC | PRN
  Administered 2021-04-09: 100 mL via INTRAVENOUS

## 2021-04-09 MED ORDER — HEPARIN SOD (PORK) LOCK FLUSH 100 UNIT/ML IV SOLN: 500.0000 [IU] | Freq: Once | INTRAVENOUS | Status: AC

## 2021-04-14 ENCOUNTER — Ambulatory Visit (HOSPITAL_COMMUNITY): Payer: Medicare HMO | Admitting: Hematology

## 2021-04-14 ENCOUNTER — Other Ambulatory Visit (HOSPITAL_COMMUNITY): Payer: Medicare HMO

## 2021-04-14 ENCOUNTER — Ambulatory Visit (HOSPITAL_COMMUNITY): Payer: Medicare HMO

## 2021-04-15 ENCOUNTER — Ambulatory Visit (HOSPITAL_COMMUNITY): Payer: Medicare HMO

## 2021-04-15 ENCOUNTER — Other Ambulatory Visit (HOSPITAL_COMMUNITY): Payer: Medicare HMO

## 2021-04-15 ENCOUNTER — Other Ambulatory Visit (HOSPITAL_COMMUNITY): Payer: Self-pay | Admitting: Hematology

## 2021-04-15 ENCOUNTER — Ambulatory Visit (HOSPITAL_COMMUNITY): Payer: Medicare HMO | Admitting: Hematology

## 2021-04-16 ENCOUNTER — Telehealth (HOSPITAL_COMMUNITY): Payer: Self-pay | Admitting: *Deleted

## 2021-04-16 NOTE — Telephone Encounter (Signed)
Received t/c from family stating that she is experiencing severe edema in left leg and is bed ridden. Called EMS last evening and states that they advised them to contact their PCP and was not taken to the ER.  Consulted with Dr Lorenso Courier, who agreed that this cannot be managed over the phone and she should see her PCP if she could get there or call EMS to take her to the ER for evaluation.  Verbalized understanding.

## 2021-04-21 ENCOUNTER — Other Ambulatory Visit (HOSPITAL_COMMUNITY): Payer: Medicare HMO

## 2021-04-21 ENCOUNTER — Ambulatory Visit (HOSPITAL_COMMUNITY): Payer: Medicare HMO

## 2021-04-22 ENCOUNTER — Ambulatory Visit (HOSPITAL_COMMUNITY): Payer: Medicare HMO

## 2021-04-22 ENCOUNTER — Other Ambulatory Visit (HOSPITAL_COMMUNITY): Payer: Medicare HMO

## 2021-04-27 ENCOUNTER — Other Ambulatory Visit: Payer: Self-pay

## 2021-04-27 ENCOUNTER — Inpatient Hospital Stay (HOSPITAL_COMMUNITY)
Admission: EM | Admit: 2021-04-27 | Discharge: 2021-05-06 | DRG: 872 | Disposition: A | Discharge status: Skilled Nursing Facility | Payer: Medicare HMO | Attending: Internal Medicine | Admitting: Internal Medicine

## 2021-04-27 ENCOUNTER — Encounter (HOSPITAL_COMMUNITY): Payer: Self-pay | Admitting: *Deleted

## 2021-04-27 ENCOUNTER — Emergency Department (HOSPITAL_COMMUNITY): Payer: Medicare HMO

## 2021-04-27 DIAGNOSIS — K921 Melena: Secondary | ICD-10-CM | POA: Diagnosis present

## 2021-04-27 DIAGNOSIS — C785 Secondary malignant neoplasm of large intestine and rectum: Secondary | ICD-10-CM | POA: Diagnosis present

## 2021-04-27 DIAGNOSIS — Z95828 Presence of other vascular implants and grafts: Secondary | ICD-10-CM | POA: Diagnosis not present

## 2021-04-27 DIAGNOSIS — M1712 Unilateral primary osteoarthritis, left knee: Secondary | ICD-10-CM | POA: Diagnosis not present

## 2021-04-27 DIAGNOSIS — I471 Supraventricular tachycardia: Secondary | ICD-10-CM | POA: Diagnosis present

## 2021-04-27 DIAGNOSIS — D6959 Other secondary thrombocytopenia: Secondary | ICD-10-CM | POA: Diagnosis present

## 2021-04-27 DIAGNOSIS — D630 Anemia in neoplastic disease: Secondary | ICD-10-CM | POA: Diagnosis present

## 2021-04-27 DIAGNOSIS — C50912 Malignant neoplasm of unspecified site of left female breast: Secondary | ICD-10-CM | POA: Diagnosis present

## 2021-04-27 DIAGNOSIS — Z86718 Personal history of other venous thrombosis and embolism: Secondary | ICD-10-CM

## 2021-04-27 DIAGNOSIS — L89312 Pressure ulcer of right buttock, stage 2: Secondary | ICD-10-CM | POA: Diagnosis present

## 2021-04-27 DIAGNOSIS — C771 Secondary and unspecified malignant neoplasm of intrathoracic lymph nodes: Secondary | ICD-10-CM | POA: Diagnosis present

## 2021-04-27 DIAGNOSIS — L89302 Pressure ulcer of unspecified buttock, stage 2: Secondary | ICD-10-CM | POA: Diagnosis not present

## 2021-04-27 DIAGNOSIS — Z833 Family history of diabetes mellitus: Secondary | ICD-10-CM

## 2021-04-27 DIAGNOSIS — Z86711 Personal history of pulmonary embolism: Secondary | ICD-10-CM

## 2021-04-27 DIAGNOSIS — I10 Essential (primary) hypertension: Secondary | ICD-10-CM | POA: Diagnosis present

## 2021-04-27 DIAGNOSIS — I4719 Other supraventricular tachycardia: Secondary | ICD-10-CM

## 2021-04-27 DIAGNOSIS — Z87891 Personal history of nicotine dependence: Secondary | ICD-10-CM

## 2021-04-27 DIAGNOSIS — I82412 Acute embolism and thrombosis of left femoral vein: Secondary | ICD-10-CM | POA: Diagnosis not present

## 2021-04-27 DIAGNOSIS — Z79899 Other long term (current) drug therapy: Secondary | ICD-10-CM

## 2021-04-27 DIAGNOSIS — E222 Syndrome of inappropriate secretion of antidiuretic hormone: Secondary | ICD-10-CM | POA: Diagnosis present

## 2021-04-27 DIAGNOSIS — N390 Urinary tract infection, site not specified: Secondary | ICD-10-CM | POA: Diagnosis present

## 2021-04-27 DIAGNOSIS — Z800 Family history of malignant neoplasm of digestive organs: Secondary | ICD-10-CM

## 2021-04-27 DIAGNOSIS — Z6841 Body Mass Index (BMI) 40.0 and over, adult: Secondary | ICD-10-CM

## 2021-04-27 DIAGNOSIS — N3000 Acute cystitis without hematuria: Secondary | ICD-10-CM | POA: Diagnosis not present

## 2021-04-27 DIAGNOSIS — E861 Hypovolemia: Secondary | ICD-10-CM | POA: Diagnosis present

## 2021-04-27 DIAGNOSIS — D649 Anemia, unspecified: Secondary | ICD-10-CM | POA: Diagnosis not present

## 2021-04-27 DIAGNOSIS — D72829 Elevated white blood cell count, unspecified: Secondary | ICD-10-CM

## 2021-04-27 DIAGNOSIS — Z66 Do not resuscitate: Secondary | ICD-10-CM | POA: Diagnosis present

## 2021-04-27 DIAGNOSIS — D62 Acute posthemorrhagic anemia: Secondary | ICD-10-CM | POA: Diagnosis present

## 2021-04-27 DIAGNOSIS — W19XXXA Unspecified fall, initial encounter: Secondary | ICD-10-CM | POA: Diagnosis not present

## 2021-04-27 DIAGNOSIS — E860 Dehydration: Secondary | ICD-10-CM | POA: Diagnosis present

## 2021-04-27 DIAGNOSIS — L89322 Pressure ulcer of left buttock, stage 2: Secondary | ICD-10-CM | POA: Diagnosis present

## 2021-04-27 DIAGNOSIS — C50611 Malignant neoplasm of axillary tail of right female breast: Secondary | ICD-10-CM | POA: Diagnosis present

## 2021-04-27 DIAGNOSIS — Z515 Encounter for palliative care: Secondary | ICD-10-CM | POA: Diagnosis not present

## 2021-04-27 DIAGNOSIS — L8990 Pressure ulcer of unspecified site, unspecified stage: Secondary | ICD-10-CM | POA: Diagnosis present

## 2021-04-27 DIAGNOSIS — R000 Tachycardia, unspecified: Secondary | ICD-10-CM | POA: Diagnosis not present

## 2021-04-27 DIAGNOSIS — S8992XA Unspecified injury of left lower leg, initial encounter: Secondary | ICD-10-CM

## 2021-04-27 DIAGNOSIS — B961 Klebsiella pneumoniae [K. pneumoniae] as the cause of diseases classified elsewhere: Secondary | ICD-10-CM | POA: Diagnosis present

## 2021-04-27 DIAGNOSIS — I82402 Acute embolism and thrombosis of unspecified deep veins of left lower extremity: Secondary | ICD-10-CM | POA: Diagnosis present

## 2021-04-27 DIAGNOSIS — M5416 Radiculopathy, lumbar region: Secondary | ICD-10-CM | POA: Diagnosis not present

## 2021-04-27 DIAGNOSIS — Z7189 Other specified counseling: Secondary | ICD-10-CM | POA: Diagnosis not present

## 2021-04-27 DIAGNOSIS — B955 Unspecified streptococcus as the cause of diseases classified elsewhere: Secondary | ICD-10-CM | POA: Diagnosis present

## 2021-04-27 DIAGNOSIS — M1612 Unilateral primary osteoarthritis, left hip: Secondary | ICD-10-CM | POA: Diagnosis not present

## 2021-04-27 DIAGNOSIS — D696 Thrombocytopenia, unspecified: Secondary | ICD-10-CM | POA: Diagnosis not present

## 2021-04-27 DIAGNOSIS — Z8249 Family history of ischemic heart disease and other diseases of the circulatory system: Secondary | ICD-10-CM

## 2021-04-27 DIAGNOSIS — C787 Secondary malignant neoplasm of liver and intrahepatic bile duct: Secondary | ICD-10-CM | POA: Diagnosis present

## 2021-04-27 DIAGNOSIS — M79605 Pain in left leg: Secondary | ICD-10-CM

## 2021-04-27 DIAGNOSIS — C50919 Malignant neoplasm of unspecified site of unspecified female breast: Secondary | ICD-10-CM | POA: Diagnosis not present

## 2021-04-27 DIAGNOSIS — Z20822 Contact with and (suspected) exposure to covid-19: Secondary | ICD-10-CM | POA: Diagnosis present

## 2021-04-27 DIAGNOSIS — E119 Type 2 diabetes mellitus without complications: Secondary | ICD-10-CM | POA: Diagnosis present

## 2021-04-27 DIAGNOSIS — R7881 Bacteremia: Principal | ICD-10-CM | POA: Diagnosis present

## 2021-04-27 DIAGNOSIS — Z7901 Long term (current) use of anticoagulants: Secondary | ICD-10-CM

## 2021-04-27 DIAGNOSIS — M25562 Pain in left knee: Secondary | ICD-10-CM | POA: Diagnosis not present

## 2021-04-27 DIAGNOSIS — M5116 Intervertebral disc disorders with radiculopathy, lumbar region: Secondary | ICD-10-CM | POA: Diagnosis present

## 2021-04-27 DIAGNOSIS — W19XXXD Unspecified fall, subsequent encounter: Secondary | ICD-10-CM | POA: Diagnosis not present

## 2021-04-27 DIAGNOSIS — B964 Proteus (mirabilis) (morganii) as the cause of diseases classified elsewhere: Secondary | ICD-10-CM | POA: Diagnosis present

## 2021-04-27 DIAGNOSIS — T451X5A Adverse effect of antineoplastic and immunosuppressive drugs, initial encounter: Secondary | ICD-10-CM | POA: Diagnosis present

## 2021-04-27 DIAGNOSIS — Z7984 Long term (current) use of oral hypoglycemic drugs: Secondary | ICD-10-CM

## 2021-04-27 LAB — CBC WITH DIFFERENTIAL/PLATELET
Abs Immature Granulocytes: 0.06 10*3/uL (ref 0.00–0.07)
Basophils Absolute: 0 10*3/uL (ref 0.0–0.1)
Basophils Relative: 0 %
Eosinophils Absolute: 0 10*3/uL (ref 0.0–0.5)
Eosinophils Relative: 0 %
HCT: 23.2 % — ABNORMAL LOW (ref 36.0–46.0)
Hemoglobin: 7.3 g/dL — ABNORMAL LOW (ref 12.0–15.0)
Immature Granulocytes: 1 %
Lymphocytes Relative: 11 %
Lymphs Abs: 1 10*3/uL (ref 0.7–4.0)
MCH: 28.5 pg (ref 26.0–34.0)
MCHC: 31.5 g/dL (ref 30.0–36.0)
MCV: 90.6 fL (ref 80.0–100.0)
Monocytes Absolute: 0.9 10*3/uL (ref 0.1–1.0)
Monocytes Relative: 10 %
Neutro Abs: 7 10*3/uL (ref 1.7–7.7)
Neutrophils Relative %: 78 %
Platelets: 56 10*3/uL — ABNORMAL LOW (ref 150–400)
RBC: 2.56 MIL/uL — ABNORMAL LOW (ref 3.87–5.11)
RDW: 21.9 % — ABNORMAL HIGH (ref 11.5–15.5)
WBC: 9.1 10*3/uL (ref 4.0–10.5)
nRBC: 0 % (ref 0.0–0.2)

## 2021-04-27 LAB — MRSA NEXT GEN BY PCR, NASAL: MRSA by PCR Next Gen: NOT DETECTED

## 2021-04-27 LAB — COMPREHENSIVE METABOLIC PANEL
ALT: 12 U/L (ref 0–44)
AST: 26 U/L (ref 15–41)
Albumin: 2.3 g/dL — ABNORMAL LOW (ref 3.5–5.0)
Alkaline Phosphatase: 103 U/L (ref 38–126)
Anion gap: 10 (ref 5–15)
BUN: 16 mg/dL (ref 8–23)
CO2: 25 mmol/L (ref 22–32)
Calcium: 8.6 mg/dL — ABNORMAL LOW (ref 8.9–10.3)
Chloride: 94 mmol/L — ABNORMAL LOW (ref 98–111)
Creatinine, Ser: 0.55 mg/dL (ref 0.44–1.00)
GFR, Estimated: >60 mL/min (ref >60)
Glucose, Bld: 147 mg/dL — ABNORMAL HIGH (ref 70–99)
Potassium: 3.8 mmol/L (ref 3.5–5.1)
Sodium: 129 mmol/L — ABNORMAL LOW (ref 135–145)
Total Bilirubin: 0.6 mg/dL (ref 0.3–1.2)
Total Protein: 7.2 g/dL (ref 6.5–8.1)

## 2021-04-27 LAB — URINALYSIS, ROUTINE W REFLEX MICROSCOPIC
Bilirubin Urine: NEGATIVE
Glucose, UA: NEGATIVE mg/dL
Ketones, ur: NEGATIVE mg/dL
Nitrite: NEGATIVE
Protein, ur: 30 mg/dL — AB
Specific Gravity, Urine: 1.025 (ref 1.005–1.030)
pH: 6.5 (ref 5.0–8.0)

## 2021-04-27 LAB — RESP PANEL BY RT-PCR (FLU A&B, COVID) ARPGX2
Influenza A by PCR: NEGATIVE
Influenza B by PCR: NEGATIVE
SARS Coronavirus 2 by RT PCR: NEGATIVE

## 2021-04-27 LAB — URINALYSIS, MICROSCOPIC (REFLEX): WBC, UA: >50 WBC/hpf (ref 0–5)

## 2021-04-27 LAB — GLUCOSE, CAPILLARY: Glucose-Capillary: 143 mg/dL — ABNORMAL HIGH (ref 70–99)

## 2021-04-27 LAB — PREPARE RBC (CROSSMATCH)

## 2021-04-27 MED ORDER — INSULIN ASPART 100 UNIT/ML IJ SOLN
0.0000 [IU] | Freq: Every day | INTRAMUSCULAR | Status: DC
  Administered 2021-04-29: 3 [IU] via SUBCUTANEOUS

## 2021-04-27 MED ORDER — SODIUM CHLORIDE 0.9 % IV SOLN: INTRAVENOUS | Status: DC

## 2021-04-27 MED ORDER — POLYETHYLENE GLYCOL 3350 17 G PO PACK: 17.0000 g | PACK | Freq: Every day | ORAL | Status: DC | PRN

## 2021-04-27 MED ORDER — ONDANSETRON HCL 4 MG/2ML IJ SOLN: 4.0000 mg | Freq: Four times a day (QID) | INTRAMUSCULAR | Status: DC | PRN

## 2021-04-27 MED ORDER — SODIUM CHLORIDE 0.9 % IV BOLUS
500.0000 mL | Freq: Once | INTRAVENOUS | Status: AC
  Administered 2021-04-27: 13:00:00 500 mL via INTRAVENOUS

## 2021-04-27 MED ORDER — HYDROMORPHONE HCL 1 MG/ML IJ SOLN
1.0000 mg | INTRAMUSCULAR | Status: DC | PRN
  Administered 2021-04-28 – 2021-05-03 (×11): 1 mg via INTRAVENOUS
  Filled 2021-04-27 (×12): qty 1

## 2021-04-27 MED ORDER — SODIUM CHLORIDE 0.9% IV SOLUTION: Freq: Once | INTRAVENOUS | Status: AC

## 2021-04-27 MED ORDER — ACETAMINOPHEN 650 MG RE SUPP: 650.0000 mg | Freq: Four times a day (QID) | RECTAL | Status: DC | PRN

## 2021-04-27 MED ORDER — IOHEXOL 350 MG/ML SOLN
100.0000 mL | Freq: Once | INTRAVENOUS | Status: AC | PRN
  Administered 2021-04-27: 17:00:00 100 mL via INTRAVENOUS

## 2021-04-27 MED ORDER — HYDROMORPHONE HCL 1 MG/ML IJ SOLN
1.0000 mg | Freq: Once | INTRAMUSCULAR | Status: AC
  Administered 2021-04-27: 19:00:00 1 mg via INTRAVENOUS
  Filled 2021-04-27: qty 1

## 2021-04-27 MED ORDER — INSULIN ASPART 100 UNIT/ML IJ SOLN
0.0000 [IU] | Freq: Three times a day (TID) | INTRAMUSCULAR | Status: DC
  Administered 2021-04-28: 2 [IU] via SUBCUTANEOUS
  Administered 2021-04-28: 1 [IU] via SUBCUTANEOUS
  Administered 2021-04-28: 2 [IU] via SUBCUTANEOUS
  Administered 2021-04-29: 1 [IU] via SUBCUTANEOUS
  Administered 2021-04-29 – 2021-04-30 (×2): 2 [IU] via SUBCUTANEOUS
  Administered 2021-04-30 – 2021-05-01 (×3): 3 [IU] via SUBCUTANEOUS
  Administered 2021-05-01: 2 [IU] via SUBCUTANEOUS
  Administered 2021-05-02 (×3): 1 [IU] via SUBCUTANEOUS
  Administered 2021-05-03 (×2): 2 [IU] via SUBCUTANEOUS
  Administered 2021-05-04 (×2): 1 [IU] via SUBCUTANEOUS
  Administered 2021-05-05 (×2): 2 [IU] via SUBCUTANEOUS

## 2021-04-27 MED ORDER — FERROUS SULFATE 325 (65 FE) MG PO TABS
325.0000 mg | ORAL_TABLET | Freq: Every day | ORAL | Status: DC
  Administered 2021-04-28 – 2021-05-06 (×9): 325 mg via ORAL
  Filled 2021-04-27 (×9): qty 1

## 2021-04-27 MED ORDER — SODIUM CHLORIDE 0.9 % IV BOLUS
500.0000 mL | Freq: Once | INTRAVENOUS | Status: AC
  Administered 2021-04-27: 18:00:00 500 mL via INTRAVENOUS

## 2021-04-27 MED ORDER — ACETAMINOPHEN 325 MG PO TABS
650.0000 mg | ORAL_TABLET | Freq: Four times a day (QID) | ORAL | Status: DC | PRN
  Administered 2021-04-28: 650 mg via ORAL
  Filled 2021-04-27: qty 2

## 2021-04-27 MED ORDER — FOLIC ACID 1 MG PO TABS
1.0000 mg | ORAL_TABLET | Freq: Every day | ORAL | Status: DC
  Administered 2021-04-28 – 2021-05-06 (×9): 1 mg via ORAL
  Filled 2021-04-27 (×9): qty 1

## 2021-04-27 MED ORDER — ONDANSETRON HCL 4 MG PO TABS: 4.0000 mg | ORAL_TABLET | Freq: Four times a day (QID) | ORAL | Status: DC | PRN

## 2021-04-27 MED ORDER — POTASSIUM CHLORIDE CRYS ER 10 MEQ PO TBCR
10.0000 meq | EXTENDED_RELEASE_TABLET | Freq: Every day | ORAL | Status: DC
  Administered 2021-04-28 – 2021-05-06 (×9): 10 meq via ORAL
  Filled 2021-04-27 (×9): qty 1

## 2021-04-27 MED ORDER — SODIUM CHLORIDE 0.9 % IV BOLUS
1000.0000 mL | Freq: Once | INTRAVENOUS | Status: AC
  Administered 2021-04-27: 20:00:00 1000 mL via INTRAVENOUS

## 2021-04-27 MED ORDER — MORPHINE SULFATE (PF) 4 MG/ML IV SOLN
4.0000 mg | INTRAVENOUS | Status: DC | PRN
  Administered 2021-04-27: 20:00:00 4 mg via INTRAVENOUS
  Filled 2021-04-27: qty 1

## 2021-04-27 NOTE — ED Provider Notes (Signed)
[facility] Provider Note   CSN: [id number] Arrival date & time: 04/27/21  1046     History No chief complaint on file.   Crystal Vega is a 70 y.o. female.  HPI Patient presents after fall.  Reportedly around 2 weeks ago fell out of bed landing on her left leg.  Pain in the left knee that is severe and also the left hip.  Difficulty moving it.  Has a history of cancer and is on chemotherapy.  Had chemotherapy or earlier this month but states she has missed 2 treatments.  Does have anemia from that.  Feels generally fatigued.  States she is been mostly bedbound and only eats when people can bring her food.Denies blood in the stool.  Denies bleeding currently.  States she had bleeding previously but none recently.  Increased swelling in left leg also.  She is on anticoagulation for previous PEs.    Past Medical History:  Diagnosis Date   Anemia    Arthritis    per patient " left knee"   DVT (deep venous thrombosis) (HCC)    left leg   Dyspnea    Essential hypertension, benign    Family history of cancer of female genital organ    Family history of GI tract cancer    Metastatic breast cancer ([place])    left breast   Port-A-Cath in place 12/25/2019   Type 2 diabetes mellitus [facility])     Patient Active Problem List   Diagnosis Date Noted   Pressure injury of skin 04/28/2021   UTI (urinary tract infection) 04/28/2021   Fall 04/27/2021   Tachycardia 04/27/2021   Thrombocytopenia ([place]) 04/27/2021   Chronic anemia 04/27/2021   Drug-induced neutropenia (HCC) 02/03/2021   Iron deficiency anemia 02/03/2021   Nausea without vomiting 11/12/2020   Rectal bleeding 08/06/2020   Left leg DVT ([place]) 03/03/2020   Family history of GI tract cancer    Family history of cancer of female genital organ    Port-A-Cath in place 12/25/2019   Colonic mass 12/19/2019   Goals of care, counseling/discussion 12/17/2019   Malignant neoplasm of overlapping sites of left breast  in female, estrogen receptor positive ([place]) 11/28/2019   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/2019   Heme positive stool 02/27/2018   FH: colon cancer 02/27/2018   Constipation 02/27/2018   Simple endometrial 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 04/16/2014   Atypical chest pain 10/10/2013   Essential hypertension, benign 10/10/2013   Morbid obesity ([place]) 10/10/2013   Type 2 diabetes mellitus ([place]) 10/10/2013    Past Surgical History:  Procedure Laterality Date   BIOPSY  11/27/2020   Procedure: BIOPSY;  Surgeon: Daneil Dolin, MD;  Location: [facility];  Service: Endoscopy;;   CATARACT EXTRACTION W/ INTRAOCULAR LENS IMPLANT Right    COLONOSCOPY WITH PROPOFOL N/A 11/27/2020   Procedure: COLONOSCOPY WITH PROPOFOL;  Surgeon: Daneil Dolin, MD;  Location: [facility];  Service: Endoscopy;  Laterality: N/A;  9:00am   HYSTEROSCOPY WITH D & C N/A 06/05/2014   Procedure: DILATATION AND CURETTAGE /HYSTEROSCOPY;  Surgeon: Florian Buff, MD;  Location: [facility] ORS;  Service: Gynecology;  Laterality: N/A;   POLYPECTOMY N/A 06/05/2014   Procedure: ENDOMETRIAL POLYPECTOMY;  Surgeon: Florian Buff, MD;  Location: [facility] OR[facility];  Service: Gynecology;  Laterality: N/A;   PORTACATH PLACEMENT N/A 12/14/2019   Procedure: INSERTION PORT-A-CATH WITH ULTRASOUND GUIDANCE;  Surgeon: Donnie Mesa, MD;  Location: [facility];  Service: General;  Laterality: N/A;   TUBAL LIGATION       OB History   No obstetric history on file.     Family History  Problem Relation Age of Onset   Diabetes Mellitus II Father    Congestive Heart Failure Father    Colon cancer Mother 23       patient not sure if colon vs stomach   Stroke Maternal Grandmother    Cancer Cousin        female reproductive cancer, dx. 50s/60s    Social History   Tobacco Use   Smoking status: Former    Types: Cigarettes   Smokeless tobacco: Never   Tobacco comments:    quit about 40+ years ago  Vaping Use   Vaping Use: Never used   Substance Use Topics   Alcohol use: Never   Drug use: No    Home Medications Prior to Admission medications   Medication Sig Start Date End Date Taking? Authorizing Provider  apixaban (ELIQUIS) 5 MG TABS tablet Take 1 tablet (5 mg total) by mouth 2 (two) times daily. 01/30/21  Yes [name], MD  ferrous sulfate 325 (65 FE) MG tablet Take 325 mg by mouth daily with breakfast.   Yes [provider]  folic acid (FOLVITE) 1 MG tablet TAKE 1 TABLET BY MOUTH EVERY DAY Patient taking differently: Take 1 mg by mouth daily. 02/09/21  Yes [name], MD  hydrochlorothiazide (HYDRODIURIL) 25 MG tablet Take 25 mg by mouth daily.   Yes [provider]  HYDROcodone-acetaminophen (NORCO/VICODIN) 5-325 MG tablet Take 1 tablet by mouth every 6 (six) hours as needed for moderate pain. 12/14/19  Yes Donnie Mesa, MD  KLOR-CON M10 10 MEQ tablet Take 10 mEq by mouth daily. 12/30/20  Yes [provider]  lidocaine-prilocaine (EMLA) cream Apply 1 application topically as needed. 04/02/21  Yes [name], MD  losartan (COZAAR) 50 MG tablet Take 50 mg by mouth daily.   Yes [provider]  metFORMIN (GLUCOPHAGE) 500 MG tablet Take 500 mg by mouth 2 (two) times daily with a meal.   Yes [provider]  pravastatin (PRAVACHOL) 10 MG tablet Take 10 mg by mouth daily.    Yes [provider]  prochlorperazine (COMPAZINE) 10 MG tablet Take 1 tablet (10 mg total) by mouth every 6 (six) hours as needed (Nausea or vomiting). 12/31/20  Yes [name], MD  chlorpheniramine-HYDROcodone (TUSSIONEX) 10-8 MG/5ML SUER SMARTSIG:1 Teaspoon By Mouth Twice Daily Patient not taking: Reported on 04/27/2021 07/29/20   [name], MD  ciprofloxacin (CIPRO) 500 MG tablet Take 1 tablet (500 mg total) by mouth 2 (two) times daily. Patient not taking: Reported on 04/27/2021 03/04/21   [name], MD  HYDROmorphone (DILAUDID) 2 MG tablet  Take 1 tablet (2 mg total) by mouth every 4 (four) hours as needed for severe pain. Patient not taking: Reported on 04/27/2021 12/30/20   [name], MD  nitrofurantoin, macrocrystal-monohydrate, (MACROBID) 100 MG capsule Take 1 capsule (100 mg total) by mouth 2 (two) times daily. Patient not taking: Reported on 04/27/2021 03/17/21   [name], MD    Allergies    Patient has no known allergies.  Review of Systems   Review of Systems  Constitutional:  Negative for appetite change and fatigue.  HENT:  Negative for congestion.   Respiratory:  Positive for shortness of breath.   Cardiovascular:  Negative for chest pain.  Gastrointestinal:  Negative for abdominal pain.  Genitourinary:  Negative for flank pain.  Musculoskeletal:  Negative for back pain.       Left hip and knee pain.  Some leg swelling  Skin:  Negative for wound.  Neurological:  Negative for weakness.  Psychiatric/Behavioral:  Negative for confusion.    Physical Exam Updated Vital Signs BP 120/77   Pulse (!) 147   Temp 98.8 F (37.1 C) (Oral)   Resp (!) 29   Ht 5\' 3"  (1.6 m)   Wt 115.1 kg   SpO2 99%   BMI 44.95 kg/m   Physical Exam Vitals and nursing note reviewed.  HENT:     Head: Normocephalic.  Eyes:     Pupils: Pupils are equal, round, and reactive to light.  Cardiovascular:     Rate and Rhythm: Tachycardia present.  Pulmonary:     Breath sounds: No wheezing or rhonchi.  Abdominal:     Tenderness: There is no abdominal tenderness.  Musculoskeletal:        General: Tenderness present.     Comments: Pain and tenderness to left hip and left knee.  Worsening edema of the left lower extremity compared to right.  Skin:    General: Skin is warm.     Capillary Refill: Capillary refill takes less than 2 seconds.  Neurological:     Mental Status: She is alert and oriented to person, place, and time.    ED Results / Procedures / Treatments   Labs (all labs ordered are listed, but only  abnormal results are displayed) Labs Reviewed  COMPREHENSIVE METABOLIC PANEL - Abnormal; Notable for the following components:      Result Value   Sodium 129 (*)    Chloride 94 (*)    Glucose, Bld 147 (*)    Calcium 8.6 (*)    Albumin 2.3 (*)    All other components within normal limits  CBC WITH DIFFERENTIAL/PLATELET - Abnormal; Notable for the following components:   RBC 2.56 (*)    Hemoglobin 7.3 (*)    HCT 23.2 (*)    RDW 21.9 (*)    Platelets 56 (*)    All other components within normal limits  URINALYSIS, ROUTINE W REFLEX MICROSCOPIC - Abnormal; Notable for the following components:   APPearance CLOUDY (*)    Hgb urine dipstick LARGE (*)    Protein, ur 30 (*)    Leukocytes,Ua MODERATE (*)    All other components within normal limits  URINALYSIS, MICROSCOPIC (REFLEX) - Abnormal; Notable for the following components:   Bacteria, UA MANY (*)    All other components within normal limits  BASIC METABOLIC PANEL - Abnormal; Notable for the following components:   Sodium 127 (*)    Chloride 95 (*)    Glucose, Bld 148 (*)    Calcium 8.3 (*)    All other components within normal limits  CBC - Abnormal; Notable for the following components:   WBC 14.8 (*)    RBC 2.86 (*)    Hemoglobin 8.1 (*)    HCT 25.7 (*)    RDW 20.5 (*)    Platelets 52 (*)    All other components within normal limits  GLUCOSE, CAPILLARY - Abnormal; Notable for the following components:   Glucose-Capillary 143 (*)    All other components within normal limits  GLUCOSE, CAPILLARY - Abnormal; Notable for the following components:   Glucose-Capillary 132 (*)    All other components within normal limits  RESP PANEL BY RT-PCR (FLU A&B, COVID) ARPGX2  MRSA NEXT GEN BY PCR, NASAL  CULTURE, BLOOD (ROUTINE X 2)  CULTURE, BLOOD (ROUTINE X 2)  TSH  PROCALCITONIN  HEMOGLOBIN A1C  HIV ANTIBODY (ROUTINE TESTING W REFLEX)  POC OCCULT BLOOD, ED  TYPE AND SCREEN  PREPARE RBC (CROSSMATCH)  TROPONIN I (HIGH  SENSITIVITY)  TROPONIN I (HIGH SENSITIVITY)    EKG EKG Interpretation  Date/Time:  Monday April 27 2021 18:06:22 EST Ventricular Rate:  135 PR Interval:  159 QRS Duration: 66 QT Interval:  315 QTC Calculation: 473 R Axis:   39 Text Interpretation: Sinus tachycardia Atrial premature complex Low voltage, precordial leads Borderline T abnormalities, diffuse leads No significant change since last tracing EARLIER SAME DATE Confirmed by Calvert Cantor [phone]) on 04/28/2021 9:00:12 AM  Radiology DG Chest 1 View  Result Date: 04/27/2021 CLINICAL DATA:  [place] 2 weeks ago EXAM: CHEST  1 VIEW COMPARISON:  07/28/2020 FINDINGS: Power port on the right with the tip in the right atrium, possibly approaching the tricuspid valve. No evidence of heart failure. Patient has taken a poor inspiration. No traumatic regional finding. IMPRESSION: Poor inspiration. No traumatic finding. Power port tip in the right atrium, possibly near the tricuspid valve. Electronically Signed   By: Nelson Chimes M.D.   On: 04/27/2021 13:18   DG Knee 1-2 Views Left  Result Date: 04/27/2021 CLINICAL DATA:  [place] 2 weeks ago.  Pain and limited range of motion. EXAM: LEFT KNEE - 1-2 VIEW COMPARISON:  None. FINDINGS: There is tricompartmental osteoarthritis with a joint effusion. No evidence of fracture or focal bone lesion. IMPRESSION: Osteoarthritis and joint effusion. No traumatic bone finding visible. Electronically Signed   By: Nelson Chimes M.D.   On: 04/27/2021 13:16   CT Angio Chest PE W and/or Wo Contrast  Result Date: 04/27/2021 CLINICAL DATA:  PE suspected, high probability. LEFT leg pain and weakness status post fall 2 weeks ago. History of metastatic LEFT breast cancer, per clinical data provided on previous CT report. EXAM: CT ANGIOGRAPHY CHEST CT ABDOMEN AND PELVIS WITH CONTRAST TECHNIQUE: Multidetector CT imaging of the chest was performed using the standard protocol during bolus administration of intravenous  contrast. Multiplanar CT image reconstructions and MIPs were obtained to evaluate the vascular anatomy. Multidetector CT imaging of the abdomen and pelvis was performed using the standard protocol during bolus administration of intravenous contrast. CONTRAST:  136mL OMNIPAQUE IOHEXOL 350 MG/ML SOLN COMPARISON:  CT chest abdomen and pelvis dated 04/09/2021. FINDINGS: CTA CHEST FINDINGS Cardiovascular: Some of the most peripheral subsegmental pulmonary arteries are difficult to definitively characterize due to patient breathing motion artifact, however, there is no pulmonary embolism seen within the main, lobar or central segmental pulmonary arteries bilaterally. No thoracic aortic aneurysm or evidence of aortic dissection. No pericardial effusion. Mediastinum/Nodes: Mediastinal and perihilar lymphadenopathy, including a 1.2 cm short axis lymph node within the anterior mediastinum and a 1.6 cm short axis lymph node within the RIGHT lower paratracheal mediastinum, not significantly changed compared to the recent chest CT of 04/09/2021, compatible with metastatic lymphadenopathy. Additional conglomerate lymphadenopathy is also again seen within the LEFT supraclavicular region, incompletely imaged. z Esophagus is unremarkable. Trachea and central bronchi are unremarkable. Lungs/Pleura: Lungs are clear.  No pleural effusion or pneumothorax. Musculoskeletal: No acute appearing osseous abnormality. Lytic-appearing lesion within the T6 vertebral body is suspicious for osseous metastasis. Additional destructive/lytic changes within the sternum, also suggesting metastatic disease. Conglomerate lymphadenopathy within the LEFT axilla, and mass/lymphadenopathy in the axillary tail region of the LEFT breast measures 4 cm greatest dimension, all of which is not  significantly changed compared to the recent chest CT of 04/09/2021. Additional milder lymphadenopathy within the RIGHT axilla is redemonstrated and stable. Review of the  MIP images confirms the above findings. CT ABDOMEN and PELVIS FINDINGS Hepatobiliary: Numerous small hypodense lesions within the bilateral liver lobes, as also described on the earlier CT abdomen report of 12/12/2019, presumably numerous small liver metastases. Single stone within the otherwise normal-appearing gallbladder. No bile duct dilatation is seen. Pancreas: Unremarkable. No pancreatic ductal dilatation or surrounding inflammatory changes. Spleen: Normal in size without focal abnormality. Adrenals/Urinary Tract: Adrenals are unremarkable. Bilateral renal cysts. Kidneys are otherwise unremarkable without suspicious mass, stone or hydronephrosis. No ureteral or bladder calculi are identified. Bladder is unremarkable. Stomach/Bowel: No dilated large or small bowel loops. Scattered diverticulosis of the descending and sigmoid colon but no focal inflammatory change to suggest acute diverticulitis. No evidence of acute bowel wall inflammation. Stomach is unremarkable. Appendix is normal. Vascular/Lymphatic: Vascular structures of the abdomen and pelvis are unremarkable. Conglomerate lymphadenopathy above the pancreatic head and adjacent to the porta hepatis, as previously described, presumed lymph node metastases. Reproductive: Calcified uterine fibroids. No adnexal mass or free fluid. Other: No free fluid or abscess collection. No free intraperitoneal air. Musculoskeletal: No acute findings. Degenerative spondylosis of the lumbar spine, mild to moderate in degree. 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 Review of the MIP images confirms the above findings. IMPRESSION: 1. No acute findings within the chest. No pulmonary embolism is seen, with mild study limitations detailed above. No evidence of pneumonia or pulmonary edema. 2. Metastatic lymphadenopathy is redemonstrated within the mediastinum, bilateral axillae, and supraclavicular LEFT neck. Also redemonstrated is the mass versus conglomerate lymphadenopathy in the  axillary tail region of the LEFT breast. These findings are stable compared to the recent chest CT of 04/09/2021. 3. Lytic-appearing lesion within the T6 vertebral body, and destructive/lytic changes within the sternum, suspicious for metastatic osseous disease. Consider nuclear medicine bone scan or PET scan for confirmation. 4. Numerous small hypodense lesions within the bilateral liver lobes, as also described on the earlier CT abdomen report of 12/12/2019, presumably liver metastases. 5. Cholelithiasis without evidence of acute cholecystitis. 6. Colonic diverticulosis without evidence of acute diverticulitis. 7. No acute findings within the abdomen or pelvis. No bowel obstruction or evidence of acute bowel wall inflammation. No free fluid or abscess collection. No evidence of acute solid organ abnormality. 8. No evidence of acute osseous fracture or dislocation is seen. Electronically Signed   By: Franki Cabot M.D.   On: 04/27/2021 17:09   CT Knee Left Wo Contrast  Result Date: 04/27/2021 CLINICAL DATA:  Fall 2 weeks ago comment limited range of motion EXAM: CT OF THE LEFT KNEE WITHOUT CONTRAST TECHNIQUE: Multidetector CT imaging of the left knee was performed according to the standard protocol. Multiplanar CT image reconstructions were also generated. COMPARISON:  Radiographs 04/27/2021 FINDINGS: Bones/Joint/Cartilage The knee was imaged in a moderately flexed orientation. There is marked proliferative spurring all 3 compartments with associated cortical irregularity which can reduce sensitivity for subtle fractures. I do not see a well-defined cortical discontinuity to indicate fracture. There is a moderate knee effusion in the suprapatellar bursa. Ligaments Suboptimally assessed by CT. Muscles and Tendons Mild regional muscular atrophy diffusely. Soft tissues Atherosclerosis. Subcutaneous edema anterior to the patella, cannot exclude prepatellar bruising. IMPRESSION: 1. Severe osteoarthritis of the knee  with proliferative spurring. No fracture is identified, although sensitivity for subtle fractures is reduced due to the degree of spurring and associated cortical irregularities. 2.  Moderate knee effusion. 3. Atherosclerosis. 4. Subcutaneous edema anterior to the patella. Electronically Signed   By: Van Clines M.D.   On: 04/27/2021 17:15   CT ABDOMEN PELVIS W CONTRAST  Result Date: 04/27/2021 CLINICAL DATA:  PE suspected, high probability. LEFT leg pain and weakness status post fall 2 weeks ago. History of metastatic LEFT breast cancer, per clinical data provided on previous CT report. EXAM: CT ANGIOGRAPHY CHEST CT ABDOMEN AND PELVIS WITH CONTRAST TECHNIQUE: Multidetector CT imaging of the chest was performed using the standard protocol during bolus administration of intravenous contrast. Multiplanar CT image reconstructions and MIPs were obtained to evaluate the vascular anatomy. Multidetector CT imaging of the abdomen and pelvis was performed using the standard protocol during bolus administration of intravenous contrast. CONTRAST:  161mL OMNIPAQUE IOHEXOL 350 MG/ML SOLN COMPARISON:  CT chest abdomen and pelvis dated 04/09/2021. FINDINGS: CTA CHEST FINDINGS Cardiovascular: Some of the most peripheral subsegmental pulmonary arteries are difficult to definitively characterize due to patient breathing motion artifact, however, there is no pulmonary embolism seen within the main, lobar or central segmental pulmonary arteries bilaterally. No thoracic aortic aneurysm or evidence of aortic dissection. No pericardial effusion. Mediastinum/Nodes: Mediastinal and perihilar lymphadenopathy, including a 1.2 cm short axis lymph node within the anterior mediastinum and a 1.6 cm short axis lymph node within the RIGHT lower paratracheal mediastinum, not significantly changed compared to the recent chest CT of 04/09/2021, compatible with metastatic lymphadenopathy. Additional conglomerate lymphadenopathy is also again  seen within the LEFT supraclavicular region, incompletely imaged. z Esophagus is unremarkable. Trachea and central bronchi are unremarkable. Lungs/Pleura: Lungs are clear.  No pleural effusion or pneumothorax. Musculoskeletal: No acute appearing osseous abnormality. Lytic-appearing lesion within the T6 vertebral body is suspicious for osseous metastasis. Additional destructive/lytic changes within the sternum, also suggesting metastatic disease. Conglomerate lymphadenopathy within the LEFT axilla, and mass/lymphadenopathy in the axillary tail region of the LEFT breast measures 4 cm greatest dimension, all of which is not significantly changed compared to the recent chest CT of 04/09/2021. Additional milder lymphadenopathy within the RIGHT axilla is redemonstrated and stable. Review of the MIP images confirms the above findings. CT ABDOMEN and PELVIS FINDINGS Hepatobiliary: Numerous small hypodense lesions within the bilateral liver lobes, as also described on the earlier CT abdomen report of 12/12/2019, presumably numerous small liver metastases. Single stone within the otherwise normal-appearing gallbladder. No bile duct dilatation is seen. Pancreas: Unremarkable. No pancreatic ductal dilatation or surrounding inflammatory changes. Spleen: Normal in size without focal abnormality. Adrenals/Urinary Tract: Adrenals are unremarkable. Bilateral renal cysts. Kidneys are otherwise unremarkable without suspicious mass, stone or hydronephrosis. No ureteral or bladder calculi are identified. Bladder is unremarkable. Stomach/Bowel: No dilated large or small bowel loops. Scattered diverticulosis of the descending and sigmoid colon but no focal inflammatory change to suggest acute diverticulitis. No evidence of acute bowel wall inflammation. Stomach is unremarkable. Appendix is normal. Vascular/Lymphatic: Vascular structures of the abdomen and pelvis are unremarkable. Conglomerate lymphadenopathy above the pancreatic head and  adjacent to the porta hepatis, as previously described, presumed lymph node metastases. Reproductive: Calcified uterine fibroids. No adnexal mass or free fluid. Other: No free fluid or abscess collection. No free intraperitoneal air. Musculoskeletal: No acute findings. Degenerative spondylosis of the lumbar spine, mild to moderate in degree. 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 Review of the MIP images confirms the above findings. IMPRESSION: 1. No acute findings within the chest. No pulmonary embolism is seen, with mild study limitations detailed above. No evidence of pneumonia or pulmonary  edema. 2. Metastatic lymphadenopathy is redemonstrated within the mediastinum, bilateral axillae, and supraclavicular LEFT neck. Also redemonstrated is the mass versus conglomerate lymphadenopathy in the axillary tail region of the LEFT breast. These findings are stable compared to the recent chest CT of 04/09/2021. 3. Lytic-appearing lesion within the T6 vertebral body, and destructive/lytic changes within the sternum, suspicious for metastatic osseous disease. Consider nuclear medicine bone scan or PET scan for confirmation. 4. Numerous small hypodense lesions within the bilateral liver lobes, as also described on the earlier CT abdomen report of 12/12/2019, presumably liver metastases. 5. Cholelithiasis without evidence of acute cholecystitis. 6. Colonic diverticulosis without evidence of acute diverticulitis. 7. No acute findings within the abdomen or pelvis. No bowel obstruction or evidence of acute bowel wall inflammation. No free fluid or abscess collection. No evidence of acute solid organ abnormality. 8. No evidence of acute osseous fracture or dislocation is seen. Electronically Signed   By: Franki Cabot M.D.   On: 04/27/2021 17:09   CT Hip Left Wo Contrast  Result Date: 04/27/2021 CLINICAL DATA:  Hip trauma, fracture suspected. EXAM: CT OF THE LEFT HIP WITHOUT CONTRAST TECHNIQUE: Multidetector CT imaging of the left  hip was performed according to the standard protocol. Multiplanar CT image reconstructions were also generated. COMPARISON:  None. FINDINGS: No evidence of acute fracture or dislocation at the LEFT hip. 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 Deformity of the LEFT humeral head/neck is compatible with previous injury versus chronic deformity related to the overlying degenerative joint disease. Visualized osseous structures of the LEFT hemipelvis appear intact and normally aligned. Additional degenerative change noted at the LEFT SI joint and within the lower lumbar spine. Visualized soft tissues about the LEFT hip are unremarkable. IMPRESSION: 1. No evidence of acute fracture or dislocation at the LEFT hip. 2. 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 Electronically Signed   By: Franki Cabot M.D.   On: 04/27/2021 17:13   DG Hip Unilat W or Wo Pelvis 2-3 Views Left  Result Date: 04/27/2021 CLINICAL DATA:  [place] 2 weeks ago.  Pain. EXAM: DG HIP (WITH OR WITHOUT PELVIS) 2-3V LEFT COMPARISON:  None. FINDINGS: There is advanced osteoarthritis of the left hip with joint space narrowing, sclerosis, osteophyte and flattening of femoral head. No definite acute regional fracture. 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 IMPRESSION: Advanced osteoarthritis of the left hip. No visible traumatic finding. Flattening of the humeral head which is probably chronic. Electronically Signed   By: Nelson Chimes M.D.   On: 04/27/2021 13:17    Procedures Procedures   Medications Ordered in ED Medications  HYDROmorphone (DILAUDID) injection 1 mg (has no administration in time range)  ferrous sulfate tablet 325 mg (325 mg Oral Given 03/50/09 3818)  folic acid (FOLVITE) tablet 1 mg (1 mg Oral Given 04/28/21 0827)  potassium chloride (KLOR-CON)  CR tablet 10 mEq (10 mEq Oral Given 04/28/21 0826)  insulin aspart (novoLOG) injection 0-9 Units (1 Units Subcutaneous Given 04/28/21 0825)  insulin aspart (novoLOG) injection 0-5 Units (0 Units Subcutaneous Not Given 04/27/21 2240)  acetaminophen (TYLENOL) tablet 650 mg (650 mg Oral Given 04/28/21 0826)    Or  acetaminophen (TYLENOL) suppository 650 mg ( Rectal See Alternative 04/28/21 0826)  polyethylene glycol (MIRALAX / GLYCOLAX) packet 17 g (has no administration in time range)  ondansetron (ZOFRAN) tablet 4  mg (has no administration in time range)    Or  ondansetron (ZOFRAN) injection 4 mg (has no administration in time range)  Chlorhexidine Gluconate Cloth 2 % PADS 6 each (has no administration in time range)  0.9 %  sodium chloride infusion ( Intravenous Rate/Dose Change 04/28/21 0746)  cefTRIAXone (ROCEPHIN) 2 g in sodium chloride 0.9 % 100 mL IVPB (2 g Intravenous New Bag/Given 04/28/21 0837)  sodium chloride 0.9 % bolus 500 mL (0 mLs Intravenous Stopped 04/27/21 1340)  sodium chloride 0.9 % bolus 500 mL (0 mLs Intravenous Stopped 04/27/21 1838)  iohexol (OMNIPAQUE) 350 MG/ML injection 100 mL (100 mLs Intravenous Contrast Given 04/27/21 1644)  HYDROmorphone (DILAUDID) injection 1 mg (1 mg Intravenous Given 04/27/21 1838)  sodium chloride 0.9 % bolus 1,000 mL (0 mLs Intravenous Stopped 04/28/21 0700)  0.9 %  sodium chloride infusion (Manually program via Guardrails IV Fluids) (0 mLs Intravenous Stopped 04/28/21 0335)    ED Course  I have reviewed the triage vital signs and the nursing notes.  Pertinent labs & imaging results that were available during my care of the patient were reviewed by me and considered in my medical decision making (see chart for details).    MDM Rules/Calculators/A&P                           Patient with fall 2 weeks ago.  Cannot ambulate since then.  Pain in left knee and left hip.  X-ray shows chronic changes but no definite acute fracture.  Found to  be tachycardic and anemic.  Anemia appears somewhat chronic also.  Denies any current bleeding.  Also thrombocytopenia.  Is on anticoagulation.  With the trauma we will get CT of the abdomen to look for blood since she is on anticoagulation.  We will also get chest CT due to DVT and tachycardia.  Will likely require admission to the hospital.  Care will be turned over to Dr. Regenia Skeeter. Final Clinical Impression(s) / ED Diagnoses Final diagnoses:  Fall, initial encounter  Injury of left knee, initial encounter  Anemia, unspecified type    Rx / DC Orders ED Discharge Orders     None        Davonna Belling, MD 04/28/21 [phone]

## 2021-04-27 NOTE — ED Notes (Signed)
Pt transported to CT scanner.

## 2021-04-27 NOTE — H&P (Addendum)
History and Physical    Crystal Vega ERD:[id number] DOB: 1950-09-23 DOA: 04/27/2021  PCP: Lemmie Evens, MD   Patient coming from: Home  I have personally briefly reviewed patient's old medical records in [place]  Chief Complaint: fall  HPI: Crystal Vega is a 70 y.o. female with medical history significant for breast cancer undergoing chemotherapy, hypertension, diabetes, colon mass, iron deficiency anemia. Patient was brought to the ED by EMS with reports of left leg pain.  Reports she was getting out of bed 2 weeks ago, she put her legs on the floor and slid down landing on her butt.  She did not hit her head or and knee when she fell.  Since the fall she has not been able to ambulate.  She has not been able to lift her left leg due to pain.  Prior to the fall she has had chronic problems-with pain involving her left lower extremity hip and knees. She denies any change in stools, reports her stools are black chronically as she is on iron tablets.  No vomiting of blood and no blood in her stools.  Denies palpitations.  No chest pain , no  difficulty breathing.  She reports she has been lying in bed for the past 2 weeks and her husband brings food for her, but she has not been able to eat well.  ED Course: Tachycardic to 160s at the time of my evaluation, temperature 98.6.  Respiratory rate 18-26.  EKG shows sinus tachycardia.  O2 sats 98- 100 % on room air.  Hemoglobin 7.3.  Platelets 56.  Sodium 129.  Pelvic x-ray shows advanced osteoarthritis of the left hip no visible traumatic finding, subsequent CT left hip confirms the same.  Left knee x-ray shows osteoarthritis and joint effusion , subsequent CT left knee-shows severe osteoarthritis of the knee with proliferative spurring, no fractures, but sensitivity for subtle structures is reduced due to degree of spurring and associated cortical irregularities, also moderate knee effusion and subcutaneous edema anterior to the  patella.  Chest x-ray-without finding, but with persistent tachycardia, CTA Chest was obtained, and abdominal CT with contrast obtained to rule out intra-abdominal bleed -no PE, no pneumonia or pulmonary edema.  Details metastatic lymphadenopathy, liver lesions, lytic appearing lesion within C6 vertebral.  1 L fluids given, patient reports some improvement in pain with 1 mg Dilaudid given.  Hospitalist admit for tachycardia, fall and inability to ambulate.  Review of Systems: As per HPI all other systems reviewed and negative.  Past Medical History:  Diagnosis Date   Anemia    Arthritis    per patient " left knee"   DVT (deep venous thrombosis) (HCC)    left leg   Dyspnea    Essential hypertension, benign    Family history of cancer of female genital organ    Family history of GI tract cancer    Metastatic breast cancer ([place])    left breast   Port-A-Cath in place 12/25/2019   Type 2 diabetes mellitus [facility])     Past Surgical History:  Procedure Laterality Date   BIOPSY  11/27/2020   Procedure: BIOPSY;  Surgeon: Daneil Dolin, MD;  [facility];  Service: Endoscopy;;   CATARACT EXTRACTION W/ INTRAOCULAR LENS IMPLANT Right    COLONOSCOPY WITH PROPOFOL N/A 11/27/2020   Procedure: COLONOSCOPY WITH PROPOFOL;  Surgeon: Daneil Dolin, MD;  Location: [facility];  Service: Endoscopy;  Laterality: N/A;  9:00am   HYSTEROSCOPY WITH D &  C N/A 06/05/2014   Procedure: DILATATION AND CURETTAGE /HYSTEROSCOPY;  Surgeon: Florian Buff, MD;  Location: [facility] OR[facility];  Service: Gynecology;  Laterality: N/A;   POLYPECTOMY N/A 06/05/2014   Procedure: ENDOMETRIAL POLYPECTOMY;  Surgeon: Florian Buff, MD;  Location: [facility];  Service: Gynecology;  Laterality: N/A;   PORTACATH PLACEMENT N/A 12/14/2019   Procedure: INSERTION PORT-A-CATH WITH ULTRASOUND GUIDANCE;  Surgeon: Donnie Mesa, MD;  Location: [place];  Service: General;  Laterality: N/A;   TUBAL LIGATION       reports that she has quit  smoking. Her smoking use included cigarettes. She has never used smokeless tobacco. She reports that she does not drink alcohol and does not use drugs.  No Known Allergies  Family History  Problem Relation Age of Onset   Diabetes Mellitus II Father    Congestive Heart Failure Father    Colon cancer Mother 66       patient not sure if colon vs stomach   Stroke Maternal Grandmother    Cancer Cousin        female reproductive cancer, dx. 50s/60s    Prior to Admission medications   Medication Sig Start Date End Date Taking? Authorizing Provider  apixaban (ELIQUIS) 5 MG TABS tablet Take 1 tablet (5 mg total) by mouth 2 (two) times daily. 01/30/21  Yes [name], MD  ferrous sulfate 325 (65 FE) MG tablet Take 325 mg by mouth daily with breakfast.   Yes [provider]  folic acid (FOLVITE) 1 MG tablet TAKE 1 TABLET BY MOUTH EVERY DAY Patient taking differently: Take 1 mg by mouth daily. 02/09/21  Yes [name], MD  hydrochlorothiazide (HYDRODIURIL) 25 MG tablet Take 25 mg by mouth daily.   Yes [provider]  HYDROcodone-acetaminophen (NORCO/VICODIN) 5-325 MG tablet Take 1 tablet by mouth every 6 (six) hours as needed for moderate pain. 12/14/19  Yes Donnie Mesa, MD  KLOR-CON M10 10 MEQ tablet Take 10 mEq by mouth daily. 12/30/20  Yes [provider]  lidocaine-prilocaine (EMLA) cream Apply 1 application topically as needed. 04/02/21  Yes [name], MD  losartan (COZAAR) 50 MG tablet Take 50 mg by mouth daily.   Yes [provider]  metFORMIN (GLUCOPHAGE) 500 MG tablet Take 500 mg by mouth 2 (two) times daily with a meal.   Yes [provider]  pravastatin (PRAVACHOL) 10 MG tablet Take 10 mg by mouth daily.    Yes [provider]  prochlorperazine (COMPAZINE) 10 MG tablet Take 1 tablet (10 mg total) by mouth every 6 (six) hours as needed (Nausea or vomiting). 12/31/20  Yes [name], MD   chlorpheniramine-HYDROcodone (TUSSIONEX) 10-8 MG/5ML SUER SMARTSIG:1 Teaspoon By Mouth Twice Daily Patient not taking: Reported on 04/27/2021 07/29/20   [name], MD  ciprofloxacin (CIPRO) 500 MG tablet Take 1 tablet (500 mg total) by mouth 2 (two) times daily. Patient not taking: Reported on 04/27/2021 03/04/21   [name], MD  HYDROmorphone (DILAUDID) 2 MG tablet Take 1 tablet (2 mg total) by mouth every 4 (four) hours as needed for severe pain. Patient not taking: Reported on 04/27/2021 12/30/20   [name], MD  nitrofurantoin, macrocrystal-monohydrate, (MACROBID) 100 MG capsule Take 1 capsule (100 mg total) by mouth 2 (two) times daily. Patient not taking: Reported on 04/27/2021 03/17/21   [name], MD    Physical Exam: Vitals:   04/27/21 1305 04/27/21 1330 04/27/21 1430 04/27/21 1805  BP: 102/67 100/75 120/90 119/90  Pulse: (!) 122 Marland Kitchen)  122 (!) 123 (!) 141  Resp: (!) 26 18 20  (!) 25  Temp:      SpO2: 99% 100% 98% 100%  Weight:      Height:        Constitutional: NAD, calm, mildly uncomfortable from left knee pain Vitals:   04/27/21 1305 04/27/21 1330 04/27/21 1430 04/27/21 1805  BP: 102/67 100/75 120/90 119/90  Pulse: (!) 122 (!) 122 (!) 123 (!) 141  Resp: (!) 26 18 20  (!) 25  Temp:      SpO2: 99% 100% 98% 100%  Weight:      Height:       Eyes: PERRL, lids and conjunctivae normal ENMT: Mucous membranes are dry. .  Neck: normal, supple, no masses, no thyromegaly Respiratory: clear to auscultation bilaterally, no wheezing, no crackles. Normal respiratory effort. No accessory muscle use.  Cardiovascular: Tachycardic, regular rate and rhythm, no murmurs / rubs / gallops. No pitting extremity edema. 2+ pedal pulses.  Port-A-Cath right upper chest without erythema or drainage. Abdomen: no tenderness, no masses palpated. No hepatosplenomegaly. Bowel sounds positive.  Musculoskeletal: no clubbing / cyanosis. No joint deformity upper and  lower extremities. Good ROM, no contractures. Normal muscle tone.  Skin: Localized swelling to left knee, no differential warmth, no discoloration, unable to move left knee due to pain and swelling.  No rashes, lesions, ulcers. No induration Neurologic: No apparent cranial nerve abnormality, 5 of 5 grip strength upper extremities, sensation intact equally, able to move right lower extremity, unable to move left lower extremity due to significant pain and swelling involving the left knee. Psychiatric: Normal judgment and insight. Alert and oriented x 3. Normal mood.   Labs on Admission: I have personally reviewed following labs and imaging studies  CBC: Recent Labs  Lab 04/27/21 1152  WBC 9.1  NEUTROABS 7.0  HGB 7.3*  HCT 23.2*  MCV 90.6  PLT 56*   Basic Metabolic Panel: Recent Labs  Lab 04/27/21 1152  NA 129*  K 3.8  CL 94*  CO2 25  GLUCOSE 147*  BUN 16  CREATININE 0.55  CALCIUM 8.6*   GFR: Estimated Creatinine Clearance: 81.4 mL/min (by C-G formula based on SCr of 0.55 mg/dL). Liver Function Tests: Recent Labs  Lab 04/27/21 1152  AST 26  ALT 12  ALKPHOS 103  BILITOT 0.6  PROT 7.2  ALBUMIN 2.3*   Urine analysis:    Component Value Date/Time   COLORURINE YELLOW 04/27/2021 1807   APPEARANCEUR CLOUDY (A) 04/27/2021 1807   LABSPEC 1.025 04/27/2021 1807   PHURINE 6.5 04/27/2021 1807   GLUCOSEU NEGATIVE 04/27/2021 1807   HGBUR LARGE (A) 04/27/2021 1807   BILIRUBINUR NEGATIVE 04/27/2021 1807   KETONESUR NEGATIVE 04/27/2021 1807   PROTEINUR 30 (A) 04/27/2021 1807   UROBILINOGEN 0.2 05/29/2014 1022   NITRITE NEGATIVE 04/27/2021 1807   LEUKOCYTESUR MODERATE (A) 04/27/2021 1807    Radiological Exams on Admission: DG Chest 1 View  Result Date: 04/27/2021 CLINICAL DATA:  [place] 2 weeks ago EXAM: CHEST  1 VIEW COMPARISON:  07/28/2020 FINDINGS: Power port on the right with the tip in the right atrium, possibly approaching the tricuspid valve. No evidence of heart  failure. Patient has taken a poor inspiration. No traumatic regional finding. IMPRESSION: Poor inspiration. No traumatic finding. Power port tip in the right atrium, possibly near the tricuspid valve. Electronically Signed   By: Nelson Chimes M.D.   On: 04/27/2021 13:18   DG Knee 1-2 Views Left  Result Date: 04/27/2021 CLINICAL DATA:  [place] 2 weeks ago.  Pain and limited range of motion. EXAM: LEFT KNEE - 1-2 VIEW COMPARISON:  None. FINDINGS: There is tricompartmental osteoarthritis with a joint effusion. No evidence of fracture or focal bone lesion. IMPRESSION: Osteoarthritis and joint effusion. No traumatic bone finding visible. Electronically Signed   By: Nelson Chimes M.D.   On: 04/27/2021 13:16   CT Angio Chest PE W and/or Wo Contrast  Result Date: 04/27/2021 CLINICAL DATA:  PE suspected, high probability. LEFT leg pain and weakness status post fall 2 weeks ago. History of metastatic LEFT breast cancer, per clinical data provided on previous CT report. EXAM: CT ANGIOGRAPHY CHEST CT ABDOMEN AND PELVIS WITH CONTRAST TECHNIQUE: Multidetector CT imaging of the chest was performed using the standard protocol during bolus administration of intravenous contrast. Multiplanar CT image reconstructions and MIPs were obtained to evaluate the vascular anatomy. Multidetector CT imaging of the abdomen and pelvis was performed using the standard protocol during bolus administration of intravenous contrast. CONTRAST:  138mL OMNIPAQUE IOHEXOL 350 MG/ML SOLN COMPARISON:  CT chest abdomen and pelvis dated 04/09/2021. FINDINGS: CTA CHEST FINDINGS Cardiovascular: Some of the most peripheral subsegmental pulmonary arteries are difficult to definitively characterize due to patient breathing motion artifact, however, there is no pulmonary embolism seen within the main, lobar or central segmental pulmonary arteries bilaterally. No thoracic aortic aneurysm or evidence of aortic dissection. No pericardial effusion.  Mediastinum/Nodes: Mediastinal and perihilar lymphadenopathy, including a 1.2 cm short axis lymph node within the anterior mediastinum and a 1.6 cm short axis lymph node within the RIGHT lower paratracheal mediastinum, not significantly changed compared to the recent chest CT of 04/09/2021, compatible with metastatic lymphadenopathy. Additional conglomerate lymphadenopathy is also again seen within the LEFT supraclavicular region, incompletely imaged. z Esophagus is unremarkable. Trachea and central bronchi are unremarkable. Lungs/Pleura: Lungs are clear.  No pleural effusion or pneumothorax. Musculoskeletal: No acute appearing osseous abnormality. Lytic-appearing lesion within the T6 vertebral body is suspicious for osseous metastasis. Additional destructive/lytic changes within the sternum, also suggesting metastatic disease. Conglomerate lymphadenopathy within the LEFT axilla, and mass/lymphadenopathy in the axillary tail region of the LEFT breast measures 4 cm greatest dimension, all of which is not significantly changed compared to the recent chest CT of 04/09/2021. Additional milder lymphadenopathy within the RIGHT axilla is redemonstrated and stable. Review of the MIP images confirms the above findings. CT ABDOMEN and PELVIS FINDINGS Hepatobiliary: Numerous small hypodense lesions within the bilateral liver lobes, as also described on the earlier CT abdomen report of 12/12/2019, presumably numerous small liver metastases. Single stone within the otherwise normal-appearing gallbladder. No bile duct dilatation is seen. Pancreas: Unremarkable. No pancreatic ductal dilatation or surrounding inflammatory changes. Spleen: Normal in size without focal abnormality. Adrenals/Urinary Tract: Adrenals are unremarkable. Bilateral renal cysts. Kidneys are otherwise unremarkable without suspicious mass, stone or hydronephrosis. No ureteral or bladder calculi are identified. Bladder is unremarkable. Stomach/Bowel: No dilated  large or small bowel loops. Scattered diverticulosis of the descending and sigmoid colon but no focal inflammatory change to suggest acute diverticulitis. No evidence of acute bowel wall inflammation. Stomach is unremarkable. Appendix is normal. Vascular/Lymphatic: Vascular structures of the abdomen and pelvis are unremarkable. Conglomerate lymphadenopathy above the pancreatic head and adjacent to the porta hepatis, as previously described, presumed lymph node metastases. Reproductive: Calcified uterine fibroids. No adnexal mass or free fluid. Other: No free fluid or abscess collection. No free intraperitoneal air. Musculoskeletal: No acute findings. Degenerative spondylosis of the lumbar spine, mild to moderate in degree. Advanced DJD  at the LEFT hip. Review of the MIP images confirms the above findings. IMPRESSION: 1. No acute findings within the chest. No pulmonary embolism is seen, with mild study limitations detailed above. No evidence of pneumonia or pulmonary edema. 2. Metastatic lymphadenopathy is redemonstrated within the mediastinum, bilateral axillae, and supraclavicular LEFT neck. Also redemonstrated is the mass versus conglomerate lymphadenopathy in the axillary tail region of the LEFT breast. These findings are stable compared to the recent chest CT of 04/09/2021. 3. Lytic-appearing lesion within the T6 vertebral body, and destructive/lytic changes within the sternum, suspicious for metastatic osseous disease. Consider nuclear medicine bone scan or PET scan for confirmation. 4. Numerous small hypodense lesions within the bilateral liver lobes, as also described on the earlier CT abdomen report of 12/12/2019, presumably liver metastases. 5. Cholelithiasis without evidence of acute cholecystitis. 6. Colonic diverticulosis without evidence of acute diverticulitis. 7. No acute findings within the abdomen or pelvis. No bowel obstruction or evidence of acute bowel wall inflammation. No free fluid or abscess  collection. No evidence of acute solid organ abnormality. 8. No evidence of acute osseous fracture or dislocation is seen. Electronically Signed   By: Franki Cabot M.D.   On: 04/27/2021 17:09   CT Knee Left Wo Contrast  Result Date: 04/27/2021 CLINICAL DATA:  Fall 2 weeks ago comment limited range of motion EXAM: CT OF THE LEFT KNEE WITHOUT CONTRAST TECHNIQUE: Multidetector CT imaging of the left knee was performed according to the standard protocol. Multiplanar CT image reconstructions were also generated. COMPARISON:  Radiographs 04/27/2021 FINDINGS: Bones/Joint/Cartilage The knee was imaged in a moderately flexed orientation. There is marked proliferative spurring all 3 compartments with associated cortical irregularity which can reduce sensitivity for subtle fractures. I do not see a well-defined cortical discontinuity to indicate fracture. There is a moderate knee effusion in the suprapatellar bursa. Ligaments Suboptimally assessed by CT. Muscles and Tendons Mild regional muscular atrophy diffusely. Soft tissues Atherosclerosis. Subcutaneous edema anterior to the patella, cannot exclude prepatellar bruising. IMPRESSION: 1. 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 No fracture is identified, although sensitivity for subtle fractures is reduced due to the degree of spurring and associated cortical irregularities. 2. Moderate knee effusion. 3. Atherosclerosis. 4. Subcutaneous edema anterior to the patella. Electronically Signed   By: Van Clines M.D.   On: 04/27/2021 17:15   CT ABDOMEN PELVIS W CONTRAST  Result Date: 04/27/2021 CLINICAL DATA:  PE suspected, high probability. LEFT leg pain and weakness status post fall 2 weeks ago. History of metastatic LEFT breast cancer, per clinical data provided on previous CT report. EXAM: CT ANGIOGRAPHY CHEST CT ABDOMEN AND PELVIS WITH CONTRAST TECHNIQUE: Multidetector CT imaging of the chest was performed using the standard protocol  during bolus administration of intravenous contrast. Multiplanar CT image reconstructions and MIPs were obtained to evaluate the vascular anatomy. Multidetector CT imaging of the abdomen and pelvis was performed using the standard protocol during bolus administration of intravenous contrast. CONTRAST:  140mL OMNIPAQUE IOHEXOL 350 MG/ML SOLN COMPARISON:  CT chest abdomen and pelvis dated 04/09/2021. FINDINGS: CTA CHEST FINDINGS Cardiovascular: Some of the most peripheral subsegmental pulmonary arteries are difficult to definitively characterize due to patient breathing motion artifact, however, there is no pulmonary embolism seen within the main, lobar or central segmental pulmonary arteries bilaterally. No thoracic aortic aneurysm or evidence of aortic dissection. No pericardial effusion. Mediastinum/Nodes: Mediastinal and perihilar lymphadenopathy, including a 1.2 cm short axis lymph node within the anterior mediastinum and a 1.6 cm short axis lymph  node within the RIGHT lower paratracheal mediastinum, not significantly changed compared to the recent chest CT of 04/09/2021, compatible with metastatic lymphadenopathy. Additional conglomerate lymphadenopathy is also again seen within the LEFT supraclavicular region, incompletely imaged. z Esophagus is unremarkable. Trachea and central bronchi are unremarkable. Lungs/Pleura: Lungs are clear.  No pleural effusion or pneumothorax. Musculoskeletal: No acute appearing osseous abnormality. Lytic-appearing lesion within the T6 vertebral body is suspicious for osseous metastasis. Additional destructive/lytic changes within the sternum, also suggesting metastatic disease. Conglomerate lymphadenopathy within the LEFT axilla, and mass/lymphadenopathy in the axillary tail region of the LEFT breast measures 4 cm greatest dimension, all of which is not significantly changed compared to the recent chest CT of 04/09/2021. Additional milder lymphadenopathy within the RIGHT axilla is  redemonstrated and stable. Review of the MIP images confirms the above findings. CT ABDOMEN and PELVIS FINDINGS Hepatobiliary: Numerous small hypodense lesions within the bilateral liver lobes, as also described on the earlier CT abdomen report of 12/12/2019, presumably numerous small liver metastases. Single stone within the otherwise normal-appearing gallbladder. No bile duct dilatation is seen. Pancreas: Unremarkable. No pancreatic ductal dilatation or surrounding inflammatory changes. Spleen: Normal in size without focal abnormality. Adrenals/Urinary Tract: Adrenals are unremarkable. Bilateral renal cysts. Kidneys are otherwise unremarkable without suspicious mass, stone or hydronephrosis. No ureteral or bladder calculi are identified. Bladder is unremarkable. Stomach/Bowel: No dilated large or small bowel loops. Scattered diverticulosis of the descending and sigmoid colon but no focal inflammatory change to suggest acute diverticulitis. No evidence of acute bowel wall inflammation. Stomach is unremarkable. Appendix is normal. Vascular/Lymphatic: Vascular structures of the abdomen and pelvis are unremarkable. Conglomerate lymphadenopathy above the pancreatic head and adjacent to the porta hepatis, as previously described, presumed lymph node metastases. Reproductive: Calcified uterine fibroids. No adnexal mass or free fluid. Other: No free fluid or abscess collection. No free intraperitoneal air. Musculoskeletal: No acute findings. Degenerative spondylosis of the lumbar spine, mild to moderate in degree. 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 Review of the MIP images confirms the above findings. IMPRESSION: 1. No acute findings within the chest. No pulmonary embolism is seen, with mild study limitations detailed above. No evidence of pneumonia or pulmonary edema. 2. Metastatic lymphadenopathy is redemonstrated within the mediastinum, bilateral axillae, and supraclavicular LEFT neck. Also redemonstrated is the mass versus  conglomerate lymphadenopathy in the axillary tail region of the LEFT breast. These findings are stable compared to the recent chest CT of 04/09/2021. 3. Lytic-appearing lesion within the T6 vertebral body, and destructive/lytic changes within the sternum, suspicious for metastatic osseous disease. Consider nuclear medicine bone scan or PET scan for confirmation. 4. Numerous small hypodense lesions within the bilateral liver lobes, as also described on the earlier CT abdomen report of 12/12/2019, presumably liver metastases. 5. Cholelithiasis without evidence of acute cholecystitis. 6. Colonic diverticulosis without evidence of acute diverticulitis. 7. No acute findings within the abdomen or pelvis. No bowel obstruction or evidence of acute bowel wall inflammation. No free fluid or abscess collection. No evidence of acute solid organ abnormality. 8. No evidence of acute osseous fracture or dislocation is seen. Electronically Signed   By: Franki Cabot M.D.   On: 04/27/2021 17:09   CT Hip Left Wo Contrast  Result Date: 04/27/2021 CLINICAL DATA:  Hip trauma, fracture suspected. EXAM: CT OF THE LEFT HIP WITHOUT CONTRAST TECHNIQUE: Multidetector CT imaging of the left hip was performed according to the standard protocol. Multiplanar CT image reconstructions were also generated. COMPARISON:  None. FINDINGS: No evidence of acute  fracture or dislocation at the LEFT hip. 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 Deformity of the LEFT humeral head/neck is compatible with previous injury versus chronic deformity related to the overlying degenerative joint disease. Visualized osseous structures of the LEFT hemipelvis appear intact and normally aligned. Additional degenerative change noted at the LEFT SI joint and within the lower lumbar spine. Visualized soft tissues  about the LEFT hip are unremarkable. IMPRESSION: 1. No evidence of acute fracture or dislocation at the LEFT hip. 2. 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 Electronically Signed   By: Franki Cabot M.D.   On: 04/27/2021 17:13   DG Hip Unilat W or Wo Pelvis 2-3 Views Left  Result Date: 04/27/2021 CLINICAL DATA:  [place] 2 weeks ago.  Pain. EXAM: DG HIP (WITH OR WITHOUT PELVIS) 2-3V LEFT COMPARISON:  None. FINDINGS: There is advanced osteoarthritis of the left hip with joint space narrowing, sclerosis, osteophyte and flattening of femoral head. No definite acute regional fracture. 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 IMPRESSION: Advanced osteoarthritis of the left hip. No visible traumatic finding. Flattening of the humeral head which is probably chronic. Electronically Signed   By: Nelson Chimes M.D.   On: 04/27/2021 13:17    EKG: Independently reviewed.  Sinus tachycardia rate, up to 143.  Subsequent EKG shows rate of 135 QTC 417.  P waves are present.  Rhythm is regular.  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  She is afebrile without leukocytosis.  Knee does not appear to be infected.  Likely would not be a candidate for surgical intervention considering her metastatic breast cancer.  Left hip also with advanced DJD. -Pain control with IV Dilaudid - PT eval  Sinus tachycardia-up to 160s at the time of my evaluation.  EKG shows P waves., Rhythm is regular.  Etiology likely combination of  dehydration, anemia, and uncontrolled pain.  Chart review shows tachycardic episodes over the past 6 months. -2 L bolus given, continue N/s 100cc/hr x 15hrs -Transfuse 1 unit -If persistent, May need rate limiting medications to avoid tachycardia induced cardiomyopathy.  Thrombocytopenia-platelets 56, recent baseline 120s to 200s.  She is on chemotherapy. -Hold Eliquis -Recheck CBC in the morning  Hyponatremia-129.  Baseline 133-135.  Likely secondary to hypovolemia -Hydrate.  DVT-  -Hold Eliquis for now with new thrombocytopenia   Acute on chronic anemia-hemoglobin 7.3 over the past 3 weeks, otherwise baseline appears to be 8. Hgb will likely drop with hemodilution.  Denies obvious GI blood loss.  Anemia panel 02/2021 suggest anemia of chronic disease. -Transfuse 1 unit PRBC  Hypertension -blood pressure soft, mostly low 100s, -Hold HCTZ and losartan for now  Diabetes mellitus-random glucose 147 - SSI- S - HgbA1c -Hold metformin  Metastatic breast cancer-follows with Dr. Delton Coombes.  ED undergoing chemotherapy with carboplatin and Taxol every 4 weeks.  CT abdomen and pelvis today shows metastatic lymphadenopathy,  suspicious lytic lesion in T6 vertebrae, multiple hypodense liver lesions, presumed metastasis.  Right colonic adenocarcinoma-surgical management of her colon cancer has been held off for now because of her advanced breast cancer.  DVT prophylaxis: SCDs Code Status: Full code Family Communication: None at bedside Disposition Plan:  ~ 2 days Consults called: None Admission status: Inpt, step down I certify that at the point of admission it is my clinical judgment that the patient will require inpatient hospital care spanning beyond 2 midnights from the point of admission due to high intensity of service, high risk for further deterioration and high frequency of surveillance required.    Bethena Roys MD [facility]  04/27/2021, 9:14 PM

## 2021-04-27 NOTE — ED Triage Notes (Addendum)
Pt brought in by [facility] from home with c/o left leg pain and weakness since she fell out of the bed 2 weeks ago.  SBP 90 per EMS.

## 2021-04-27 NOTE — ED Notes (Signed)
Pt moved to room 19 and EKG completed. Pt placed on cardiac monitor with BP to set cycle every 30 minutes. Continuous pulse oximeter applied.

## 2021-04-28 ENCOUNTER — Inpatient Hospital Stay (HOSPITAL_COMMUNITY): Payer: Medicare HMO

## 2021-04-28 DIAGNOSIS — I82412 Acute embolism and thrombosis of left femoral vein: Secondary | ICD-10-CM

## 2021-04-28 DIAGNOSIS — I10 Essential (primary) hypertension: Secondary | ICD-10-CM | POA: Diagnosis not present

## 2021-04-28 DIAGNOSIS — L8990 Pressure ulcer of unspecified site, unspecified stage: Secondary | ICD-10-CM | POA: Diagnosis present

## 2021-04-28 DIAGNOSIS — I471 Supraventricular tachycardia: Secondary | ICD-10-CM

## 2021-04-28 DIAGNOSIS — W19XXXD Unspecified fall, subsequent encounter: Secondary | ICD-10-CM

## 2021-04-28 DIAGNOSIS — N390 Urinary tract infection, site not specified: Secondary | ICD-10-CM | POA: Diagnosis present

## 2021-04-28 DIAGNOSIS — D649 Anemia, unspecified: Secondary | ICD-10-CM | POA: Diagnosis not present

## 2021-04-28 LAB — BASIC METABOLIC PANEL
Anion gap: 9 (ref 5–15)
BUN: 15 mg/dL (ref 8–23)
CO2: 23 mmol/L (ref 22–32)
Calcium: 8.3 mg/dL — ABNORMAL LOW (ref 8.9–10.3)
Chloride: 95 mmol/L — ABNORMAL LOW (ref 98–111)
Creatinine, Ser: 0.55 mg/dL (ref 0.44–1.00)
GFR, Estimated: >60 mL/min (ref >60)
Glucose, Bld: 148 mg/dL — ABNORMAL HIGH (ref 70–99)
Potassium: 3.6 mmol/L (ref 3.5–5.1)
Sodium: 127 mmol/L — ABNORMAL LOW (ref 135–145)

## 2021-04-28 LAB — CBC
HCT: 25.7 % — ABNORMAL LOW (ref 36.0–46.0)
Hemoglobin: 8.1 g/dL — ABNORMAL LOW (ref 12.0–15.0)
MCH: 28.3 pg (ref 26.0–34.0)
MCHC: 31.5 g/dL (ref 30.0–36.0)
MCV: 89.9 fL (ref 80.0–100.0)
Platelets: 52 10*3/uL — ABNORMAL LOW (ref 150–400)
RBC: 2.86 MIL/uL — ABNORMAL LOW (ref 3.87–5.11)
RDW: 20.5 % — ABNORMAL HIGH (ref 11.5–15.5)
WBC: 14.8 10*3/uL — ABNORMAL HIGH (ref 4.0–10.5)
nRBC: 0 % (ref 0.0–0.2)

## 2021-04-28 LAB — GLUCOSE, CAPILLARY
Glucose-Capillary: 132 mg/dL — ABNORMAL HIGH (ref 70–99)
Glucose-Capillary: 155 mg/dL — ABNORMAL HIGH (ref 70–99)
Glucose-Capillary: 145 mg/dL — ABNORMAL HIGH (ref 70–99)
Glucose-Capillary: 118 mg/dL — ABNORMAL HIGH (ref 70–99)

## 2021-04-28 LAB — TROPONIN I (HIGH SENSITIVITY)
Troponin I (High Sensitivity): 12 ng/L (ref <18)
Troponin I (High Sensitivity): 10 ng/L (ref <18)

## 2021-04-28 LAB — HIV ANTIBODY (ROUTINE TESTING W REFLEX): HIV Screen 4th Generation wRfx: NONREACTIVE

## 2021-04-28 LAB — HEMOGLOBIN A1C
Hgb A1c MFr Bld: 5.5 % (ref 4.8–5.6)
Mean Plasma Glucose: 111 mg/dL

## 2021-04-28 LAB — PROCALCITONIN: Procalcitonin: 0.51 ng/mL

## 2021-04-28 LAB — TSH: TSH: 2.009 u[IU]/mL (ref 0.350–4.500)

## 2021-04-28 MED ORDER — SODIUM CHLORIDE 0.9 % IV SOLN
INTRAVENOUS | Status: AC
  Filled 2021-04-28: qty 20

## 2021-04-28 MED ORDER — ENSURE MAX PROTEIN PO LIQD
11.0000 [oz_av] | Freq: Every day | ORAL | Status: DC
Start: 2021-04-28 — End: 2021-05-06
  Administered 2021-04-28 – 2021-05-05 (×7): 11 [oz_av] via ORAL

## 2021-04-28 MED ORDER — METOPROLOL TARTRATE 25 MG PO TABS
ORAL_TABLET | ORAL | Status: AC
  Filled 2021-04-28: qty 1

## 2021-04-28 MED ORDER — SODIUM CHLORIDE 0.9 % IV SOLN: INTRAVENOUS | Status: DC

## 2021-04-28 MED ORDER — METOPROLOL TARTRATE 25 MG PO TABS
25.0000 mg | ORAL_TABLET | Freq: Two times a day (BID) | ORAL | Status: DC
  Administered 2021-04-28 – 2021-05-06 (×14): 25 mg via ORAL
  Filled 2021-04-28 (×15): qty 1

## 2021-04-28 MED ORDER — SODIUM CHLORIDE 0.9 % IV SOLN
2.0000 g | INTRAVENOUS | Status: DC
  Administered 2021-04-28: 2 g via INTRAVENOUS

## 2021-04-28 MED ORDER — CHLORHEXIDINE GLUCONATE CLOTH 2 % EX PADS
6.0000 | MEDICATED_PAD | Freq: Every day | CUTANEOUS | Status: DC
  Administered 2021-04-28 – 2021-05-06 (×9): 6 via TOPICAL

## 2021-04-28 MED ORDER — SODIUM CHLORIDE 0.9 % IV SOLN: INTRAVENOUS | Status: AC

## 2021-04-28 NOTE — Consult Note (Signed)
Cardiology Consultation:   Patient ID: ANETRIA HARWICK MRN: [id number]; DOB: 1950-07-17  Admit date: 04/27/2021 Date of Consult: 04/28/2021  PCP:  Lemmie Evens, MD   [facility] Providers Cardiologist:  None        Patient Profile:   HEDDY VIDANA is a 70 y.o. female with a hx of breast cancer on chemo, HTN, DMII, and iron deficiency anemia who is being seen 04/28/2021 for the evaluation of tachycardia at the request of Dr. Cyd Silence.  History of Present Illness:   Ms. Gaetz is a 70 year old female with history detailed above who presented to [facility] after being in bed for 2 weeks after fall with inability to ambulate. Her hospital course was complicated by new onset narrow complex tachycardia for which Cardiology has been consulted.  Currently, the patient continues to have joint pain that is exacerbated by movement. Denies any chest pain, palpitations, lightheadedness or history of syncope. States she gets short of breath with ambulation and has occasional chest pounding at home. Denies known history of arrhythmias or known CAD.   Tele here with what appears to be SVT (AVNRT) vs atrial tach with HR ranging from 120-150s.    Past Medical History:  Diagnosis Date   Anemia    Arthritis    per patient " left knee"   DVT (deep venous thrombosis) (HCC)    left leg   Dyspnea    Essential hypertension, benign    Family history of cancer of female genital organ    Family history of GI tract cancer    Metastatic breast cancer ([place])    left breast   Port-A-Cath in place 12/25/2019   Type 2 diabetes mellitus [facility])     Past Surgical History:  Procedure Laterality Date   BIOPSY  11/27/2020   Procedure: BIOPSY;  Surgeon: Daneil Dolin, MD;  Location: [facility];  Service: Endoscopy;;   CATARACT EXTRACTION W/ INTRAOCULAR LENS IMPLANT Right    COLONOSCOPY WITH PROPOFOL N/A 11/27/2020   Procedure: COLONOSCOPY WITH PROPOFOL;  Surgeon: Daneil Dolin, MD;  Location: [facility]  END[facility];  Service: Endoscopy;  Laterality: N/A;  9:00am   HYSTEROSCOPY WITH D & C N/A 06/05/2014   Procedure: DILATATION AND CURETTAGE /HYSTEROSCOPY;  Surgeon: Florian Buff, MD;  Location: [facility] ORS;  Service: Gynecology;  Laterality: N/A;   POLYPECTOMY N/A 06/05/2014   Procedure: ENDOMETRIAL POLYPECTOMY;  Surgeon: Florian Buff, MD;  Location: [facility];  Service: Gynecology;  Laterality: N/A;   PORTACATH PLACEMENT N/A 12/14/2019   Procedure: INSERTION PORT-A-CATH WITH ULTRASOUND GUIDANCE;  Surgeon: Donnie [place], MD;  Location: [place];  Service: General;  Laterality: N/A;   TUBAL LIGATION       Home Medications:  Prior to Admission medications   Medication Sig Start Date End Date Taking? Authorizing Provider  apixaban (ELIQUIS) 5 MG TABS tablet Take 1 tablet (5 mg total) by mouth 2 (two) times daily. 01/30/21  Yes [name], MD  ferrous sulfate 325 (65 FE) MG tablet Take 325 mg by mouth daily with breakfast.   Yes [provider]  folic acid (FOLVITE) 1 MG tablet TAKE 1 TABLET BY MOUTH EVERY DAY Patient taking differently: Take 1 mg by mouth daily. 02/09/21  Yes [name], MD  hydrochlorothiazide (HYDRODIURIL) 25 MG tablet Take 25 mg by mouth daily.   Yes [provider]  HYDROcodone-acetaminophen (NORCO/VICODIN) 5-325 MG tablet Take 1 tablet by mouth every 6 (six) hours as needed for moderate pain.  12/14/19  Yes Donnie [place], MD  KLOR-CON M10 10 MEQ tablet Take 10 mEq by mouth daily. 12/30/20  Yes [provider]  lidocaine-prilocaine (EMLA) cream Apply 1 application topically as needed. 04/02/21  Yes [name], MD  losartan (COZAAR) 50 MG tablet Take 50 mg by mouth daily.   Yes [provider]  metFORMIN (GLUCOPHAGE) 500 MG tablet Take 500 mg by mouth 2 (two) times daily with a meal.   Yes [provider]  pravastatin (PRAVACHOL) 10 MG tablet Take 10 mg by mouth daily.    Yes [provider]  prochlorperazine  (COMPAZINE) 10 MG tablet Take 1 tablet (10 mg total) by mouth every 6 (six) hours as needed (Nausea or vomiting). 12/31/20  Yes [name], MD  chlorpheniramine-HYDROcodone (TUSSIONEX) 10-8 MG/5ML SUER SMARTSIG:1 Teaspoon By Mouth Twice Daily Patient not taking: Reported on 04/27/2021 07/29/20   [name], MD  ciprofloxacin (CIPRO) 500 MG tablet Take 1 tablet (500 mg total) by mouth 2 (two) times daily. Patient not taking: Reported on 04/27/2021 03/04/21   [name], MD  HYDROmorphone (DILAUDID) 2 MG tablet Take 1 tablet (2 mg total) by mouth every 4 (four) hours as needed for severe pain. Patient not taking: Reported on 04/27/2021 12/30/20   [name], MD  nitrofurantoin, macrocrystal-monohydrate, (MACROBID) 100 MG capsule Take 1 capsule (100 mg total) by mouth 2 (two) times daily. Patient not taking: Reported on 04/27/2021 03/17/21   [name], MD    Inpatient Medications: Scheduled Meds:  Chlorhexidine Gluconate Cloth  6 each Topical Daily   ferrous sulfate  325 mg Oral Q breakfast   folic acid  1 mg Oral Daily   insulin aspart  0-5 Units Subcutaneous QHS   insulin aspart  0-9 Units Subcutaneous TID WC   potassium chloride  10 mEq Oral Daily   Continuous Infusions:  sodium chloride 50 mL/hr at 04/28/21 0746   cefTRIAXone (ROCEPHIN)  IV 2 g (04/28/21 0837)   PRN Meds: acetaminophen **OR** acetaminophen, HYDROmorphone (DILAUDID) injection, ondansetron **OR** ondansetron (ZOFRAN) IV, polyethylene glycol  Allergies:   No Known Allergies  Social History:   Social History   Socioeconomic History   Marital status: Married    Spouse name: Herbie [place]   Number of children: 2   Years of education: Not on file   Highest education level: Not on file  Occupational History   Occupation: retired  Tobacco Use   Smoking status: Former    Types: Cigarettes   Smokeless tobacco: Never   Tobacco comments:    quit about 40+ years ago  Vaping Use    Vaping Use: Never used  Substance and Sexual Activity   Alcohol use: Never   Drug use: No   Sexual activity: Not on file  Other Topics Concern   Not on file  Social History Narrative   Not on file   Social Determinants of Health   Financial Resource Strain: Not on file  Food Insecurity: Not on file  Transportation Needs: Not on file  Physical Activity: Not on file  Stress: Not on file  Social Connections: Not on file  Intimate Partner Violence: Not on file    Family History:    Family History  Problem Relation Age of Onset   Diabetes Mellitus II Father    Congestive Heart Failure Father    Colon cancer Mother 64       patient not sure if colon vs stomach   Stroke Maternal Grandmother    Cancer Cousin  female reproductive cancer, dx. 50s/60s     ROS:  Please see the history of present illness.  Review of Systems  Constitutional:  Positive for malaise/fatigue.  Respiratory:  Positive for shortness of breath.   Cardiovascular:  Negative for chest pain, palpitations, orthopnea, claudication, leg swelling and PND.  Musculoskeletal:  Positive for joint pain.  Neurological:  Negative for dizziness and loss of consciousness.   All other ROS reviewed and negative.     Physical Exam/Data:   Vitals:   04/28/21 0500 04/28/21 0600 04/28/21 0700 04/28/21 0721  BP: 99/80 111/67 120/77   Pulse: (!) 146 (!) 118 (!) 114 (!) 147  Resp: (!) 29 (!) 25 (!) 27 (!) 29  Temp:    98.8 F (37.1 C)  TempSrc:    Oral  SpO2: 99% 99% 100% 99%  Weight:      Height:        Intake/Output Summary (Last 24 hours) at 04/28/2021 0913 Last data filed at 04/28/2021 0721 Gross per 24 hour  Intake 2009.51 ml  Output --  Net 2009.51 ml   Last 3 Weights 04/28/2021 04/27/2021 04/27/2021  Weight (lbs) 253 lb 12 oz 253 lb 12 oz 255 lb  Weight (kg) 115.1 kg 115.1 kg 115.667 kg     Body mass index is 44.95 kg/m.  General:  Laying in bed, NAD HEENT: normal Neck: no JVD Vascular: No  carotid bruits; Distal pulses 2+ bilaterally Cardiac:  tachycardic, regular, no murmurs Lungs:  clear to auscultation bilaterally, no wheezing, rhonchi or rales. Right port site in place Abd: soft, nontender Ext: no edema Musculoskeletal:  No deformities, BUE and BLE strength normal and equal Skin: warm and dry  Neuro:  CNs 2-12 intact, no focal abnormalities noted Psych:  Normal affect   EKG:  The EKG was personally reviewed and demonstrates:  SVT (vs atach) Telemetry:  Telemetry was personally reviewed and demonstrates:  Appears to be SVT (possibly AVNRT) vs atach  Relevant CV Studies: TTE 09/19/20: IMPRESSIONS   1. Images were very limited and IV access could not be obtained for use  of Definity. Could consider future evaluation of LVEF via MUGA scan.   2. Left ventricular ejection fraction, by estimation, is 60 to 65%. The  left ventricle has normal function. Left ventricular endocardial border  not optimally defined to evaluate regional wall motion. There is moderate  left ventricular hypertrophy. Left  ventricular diastolic parameters are indeterminate.   3. Right ventricular systolic function is normal. The right ventricular  size is normal. Tricuspid regurgitation signal is inadequate for assessing  PA pressure.   4. The mitral valve is grossly normal. Trivial mitral valve  regurgitation.   5. The aortic valve is tricuspid. There is mild calcification of the  aortic valve. Aortic valve regurgitation is not visualized.   6. The inferior vena cava is normal in size with greater than 50%  respiratory variability, suggesting right atrial pressure of 3 mmHg.   Laboratory Data:  High Sensitivity Troponin:   Recent Labs  Lab 04/28/21 0432 04/28/21 0646  TROPONINIHS 12 10     Chemistry Recent Labs  Lab 04/27/21 1152 04/28/21 0432  NA 129* 127*  K 3.8 3.6  CL 94* 95*  CO2 25 23  GLUCOSE 147* 148*  BUN 16 15  CREATININE 0.55 0.55  CALCIUM 8.6* 8.3*  GFRNONAA >60  >60  ANIONGAP 10 9    Recent Labs  Lab 04/27/21 1152  PROT 7.2  ALBUMIN 2.3*  AST 26  ALT 12  ALKPHOS 103  BILITOT 0.6   Lipids No results for input(s): CHOL, TRIG, HDL, LABVLDL, LDLCALC, CHOLHDL in the last 168 hours.  Hematology Recent Labs  Lab 04/27/21 1152 04/28/21 0432  WBC 9.1 14.8*  RBC 2.56* 2.86*  HGB 7.3* 8.1*  HCT 23.2* 25.7*  MCV 90.6 89.9  MCH 28.5 28.3  MCHC 31.5 31.5  RDW 21.9* 20.5*  PLT 56* 52*   Thyroid  Recent Labs  Lab 04/28/21 0432  TSH 2.009    BNPNo results for input(s): BNP, PROBNP in the last 168 hours.  DDimer No results for input(s): DDIMER in the last 168 hours.   Radiology/Studies:  DG Chest 1 View  Result Date: 04/27/2021 CLINICAL DATA:  [place] 2 weeks ago EXAM: CHEST  1 VIEW COMPARISON:  07/28/2020 FINDINGS: Power port on the right with the tip in the right atrium, possibly approaching the tricuspid valve. No evidence of heart failure. Patient has taken a poor inspiration. No traumatic regional finding. IMPRESSION: Poor inspiration. No traumatic finding. Power port tip in the right atrium, possibly near the tricuspid valve. Electronically Signed   By: Nelson Chimes M.D.   On: 04/27/2021 13:18   DG Knee 1-2 Views Left  Result Date: 04/27/2021 CLINICAL DATA:  [place] 2 weeks ago.  Pain and limited range of motion. EXAM: LEFT KNEE - 1-2 VIEW COMPARISON:  None. FINDINGS: There is tricompartmental osteoarthritis with a joint effusion. No evidence of fracture or focal bone lesion. IMPRESSION: Osteoarthritis and joint effusion. No traumatic bone finding visible. Electronically Signed   By: Nelson Chimes M.D.   On: 04/27/2021 13:16   CT Angio Chest PE W and/or Wo Contrast  Result Date: 04/27/2021 CLINICAL DATA:  PE suspected, high probability. LEFT leg pain and weakness status post fall 2 weeks ago. History of metastatic LEFT breast cancer, per clinical data provided on previous CT report. EXAM: CT ANGIOGRAPHY CHEST CT ABDOMEN AND PELVIS WITH  CONTRAST TECHNIQUE: Multidetector CT imaging of the chest was performed using the standard protocol during bolus administration of intravenous contrast. Multiplanar CT image reconstructions and MIPs were obtained to evaluate the vascular anatomy. Multidetector CT imaging of the abdomen and pelvis was performed using the standard protocol during bolus administration of intravenous contrast. CONTRAST:  143mL OMNIPAQUE IOHEXOL 350 MG/ML SOLN COMPARISON:  CT chest abdomen and pelvis dated 04/09/2021. FINDINGS: CTA CHEST FINDINGS Cardiovascular: Some of the most peripheral subsegmental pulmonary arteries are difficult to definitively characterize due to patient breathing motion artifact, however, there is no pulmonary embolism seen within the main, lobar or central segmental pulmonary arteries bilaterally. No thoracic aortic aneurysm or evidence of aortic dissection. No pericardial effusion. Mediastinum/Nodes: Mediastinal and perihilar lymphadenopathy, including a 1.2 cm short axis lymph node within the anterior mediastinum and a 1.6 cm short axis lymph node within the RIGHT lower paratracheal mediastinum, not significantly changed compared to the recent chest CT of 04/09/2021, compatible with metastatic lymphadenopathy. Additional conglomerate lymphadenopathy is also again seen within the LEFT supraclavicular region, incompletely imaged. z Esophagus is unremarkable. Trachea and central bronchi are unremarkable. Lungs/Pleura: Lungs are clear.  No pleural effusion or pneumothorax. Musculoskeletal: No acute appearing osseous abnormality. Lytic-appearing lesion within the T6 vertebral body is suspicious for osseous metastasis. Additional destructive/lytic changes within the sternum, also suggesting metastatic disease. Conglomerate lymphadenopathy within the LEFT axilla, and mass/lymphadenopathy in the axillary tail region of the LEFT breast measures 4 cm greatest dimension, all of which is not significantly changed compared  to the recent chest  CT of 04/09/2021. Additional milder lymphadenopathy within the RIGHT axilla is redemonstrated and stable. Review of the MIP images confirms the above findings. CT ABDOMEN and PELVIS FINDINGS Hepatobiliary: Numerous small hypodense lesions within the bilateral liver lobes, as also described on the earlier CT abdomen report of 12/12/2019, presumably numerous small liver metastases. Single stone within the otherwise normal-appearing gallbladder. No bile duct dilatation is seen. Pancreas: Unremarkable. No pancreatic ductal dilatation or surrounding inflammatory changes. Spleen: Normal in size without focal abnormality. Adrenals/Urinary Tract: Adrenals are unremarkable. Bilateral renal cysts. Kidneys are otherwise unremarkable without suspicious mass, stone or hydronephrosis. No ureteral or bladder calculi are identified. Bladder is unremarkable. Stomach/Bowel: No dilated large or small bowel loops. Scattered diverticulosis of the descending and sigmoid colon but no focal inflammatory change to suggest acute diverticulitis. No evidence of acute bowel wall inflammation. Stomach is unremarkable. Appendix is normal. Vascular/Lymphatic: Vascular structures of the abdomen and pelvis are unremarkable. Conglomerate lymphadenopathy above the pancreatic head and adjacent to the porta hepatis, as previously described, presumed lymph node metastases. Reproductive: Calcified uterine fibroids. No adnexal mass or free fluid. Other: No free fluid or abscess collection. No free intraperitoneal air. Musculoskeletal: No acute findings. Degenerative spondylosis of the lumbar spine, mild to moderate in degree. 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 Review of the MIP images confirms the above findings. IMPRESSION: 1. No acute findings within the chest. No pulmonary embolism is seen, with mild study limitations detailed above. No evidence of pneumonia or pulmonary edema. 2. Metastatic lymphadenopathy is redemonstrated within the  mediastinum, bilateral axillae, and supraclavicular LEFT neck. Also redemonstrated is the mass versus conglomerate lymphadenopathy in the axillary tail region of the LEFT breast. These findings are stable compared to the recent chest CT of 04/09/2021. 3. Lytic-appearing lesion within the T6 vertebral body, and destructive/lytic changes within the sternum, suspicious for metastatic osseous disease. Consider nuclear medicine bone scan or PET scan for confirmation. 4. Numerous small hypodense lesions within the bilateral liver lobes, as also described on the earlier CT abdomen report of 12/12/2019, presumably liver metastases. 5. Cholelithiasis without evidence of acute cholecystitis. 6. Colonic diverticulosis without evidence of acute diverticulitis. 7. No acute findings within the abdomen or pelvis. No bowel obstruction or evidence of acute bowel wall inflammation. No free fluid or abscess collection. No evidence of acute solid organ abnormality. 8. No evidence of acute osseous fracture or dislocation is seen. Electronically Signed   By: Franki Cabot M.D.   On: 04/27/2021 17:09   CT Knee Left Wo Contrast  Result Date: 04/27/2021 CLINICAL DATA:  Fall 2 weeks ago comment limited range of motion EXAM: CT OF THE LEFT KNEE WITHOUT CONTRAST TECHNIQUE: Multidetector CT imaging of the left knee was performed according to the standard protocol. Multiplanar CT image reconstructions were also generated. COMPARISON:  Radiographs 04/27/2021 FINDINGS: Bones/Joint/Cartilage The knee was imaged in a moderately flexed orientation. There is marked proliferative spurring all 3 compartments with associated cortical irregularity which can reduce sensitivity for subtle fractures. I do not see a well-defined cortical discontinuity to indicate fracture. There is a moderate knee effusion in the suprapatellar bursa. Ligaments Suboptimally assessed by CT. Muscles and Tendons Mild regional muscular atrophy diffusely. Soft tissues  Atherosclerosis. Subcutaneous edema anterior to the patella, cannot exclude prepatellar bruising. IMPRESSION: 1. 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 No fracture is identified, although sensitivity for subtle fractures is reduced due to the degree of spurring and associated cortical irregularities. 2. Moderate knee effusion. 3. Atherosclerosis. 4. Subcutaneous edema  anterior to the patella. Electronically Signed   By: Van Clines M.D.   On: 04/27/2021 17:15   CT ABDOMEN PELVIS W CONTRAST  Result Date: 04/27/2021 CLINICAL DATA:  PE suspected, high probability. LEFT leg pain and weakness status post fall 2 weeks ago. History of metastatic LEFT breast cancer, per clinical data provided on previous CT report. EXAM: CT ANGIOGRAPHY CHEST CT ABDOMEN AND PELVIS WITH CONTRAST TECHNIQUE: Multidetector CT imaging of the chest was performed using the standard protocol during bolus administration of intravenous contrast. Multiplanar CT image reconstructions and MIPs were obtained to evaluate the vascular anatomy. Multidetector CT imaging of the abdomen and pelvis was performed using the standard protocol during bolus administration of intravenous contrast. CONTRAST:  150mL OMNIPAQUE IOHEXOL 350 MG/ML SOLN COMPARISON:  CT chest abdomen and pelvis dated 04/09/2021. FINDINGS: CTA CHEST FINDINGS Cardiovascular: Some of the most peripheral subsegmental pulmonary arteries are difficult to definitively characterize due to patient breathing motion artifact, however, there is no pulmonary embolism seen within the main, lobar or central segmental pulmonary arteries bilaterally. No thoracic aortic aneurysm or evidence of aortic dissection. No pericardial effusion. Mediastinum/Nodes: Mediastinal and perihilar lymphadenopathy, including a 1.2 cm short axis lymph node within the anterior mediastinum and a 1.6 cm short axis lymph node within the RIGHT lower paratracheal mediastinum, not significantly  changed compared to the recent chest CT of 04/09/2021, compatible with metastatic lymphadenopathy. Additional conglomerate lymphadenopathy is also again seen within the LEFT supraclavicular region, incompletely imaged. z Esophagus is unremarkable. Trachea and central bronchi are unremarkable. Lungs/Pleura: Lungs are clear.  No pleural effusion or pneumothorax. Musculoskeletal: No acute appearing osseous abnormality. Lytic-appearing lesion within the T6 vertebral body is suspicious for osseous metastasis. Additional destructive/lytic changes within the sternum, also suggesting metastatic disease. Conglomerate lymphadenopathy within the LEFT axilla, and mass/lymphadenopathy in the axillary tail region of the LEFT breast measures 4 cm greatest dimension, all of which is not significantly changed compared to the recent chest CT of 04/09/2021. Additional milder lymphadenopathy within the RIGHT axilla is redemonstrated and stable. Review of the MIP images confirms the above findings. CT ABDOMEN and PELVIS FINDINGS Hepatobiliary: Numerous small hypodense lesions within the bilateral liver lobes, as also described on the earlier CT abdomen report of 12/12/2019, presumably numerous small liver metastases. Single stone within the otherwise normal-appearing gallbladder. No bile duct dilatation is seen. Pancreas: Unremarkable. No pancreatic ductal dilatation or surrounding inflammatory changes. Spleen: Normal in size without focal abnormality. Adrenals/Urinary Tract: Adrenals are unremarkable. Bilateral renal cysts. Kidneys are otherwise unremarkable without suspicious mass, stone or hydronephrosis. No ureteral or bladder calculi are identified. Bladder is unremarkable. Stomach/Bowel: No dilated large or small bowel loops. Scattered diverticulosis of the descending and sigmoid colon but no focal inflammatory change to suggest acute diverticulitis. No evidence of acute bowel wall inflammation. Stomach is unremarkable. Appendix  is normal. Vascular/Lymphatic: Vascular structures of the abdomen and pelvis are unremarkable. Conglomerate lymphadenopathy above the pancreatic head and adjacent to the porta hepatis, as previously described, presumed lymph node metastases. Reproductive: Calcified uterine fibroids. No adnexal mass or free fluid. Other: No free fluid or abscess collection. No free intraperitoneal air. Musculoskeletal: No acute findings. Degenerative spondylosis of the lumbar spine, mild to moderate in degree. 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 Review of the MIP images confirms the above findings. IMPRESSION: 1. No acute findings within the chest. No pulmonary embolism is seen, with mild study limitations detailed above. No evidence of pneumonia or pulmonary edema. 2. Metastatic lymphadenopathy is redemonstrated within the  mediastinum, bilateral axillae, and supraclavicular LEFT neck. Also redemonstrated is the mass versus conglomerate lymphadenopathy in the axillary tail region of the LEFT breast. These findings are stable compared to the recent chest CT of 04/09/2021. 3. Lytic-appearing lesion within the T6 vertebral body, and destructive/lytic changes within the sternum, suspicious for metastatic osseous disease. Consider nuclear medicine bone scan or PET scan for confirmation. 4. Numerous small hypodense lesions within the bilateral liver lobes, as also described on the earlier CT abdomen report of 12/12/2019, presumably liver metastases. 5. Cholelithiasis without evidence of acute cholecystitis. 6. Colonic diverticulosis without evidence of acute diverticulitis. 7. No acute findings within the abdomen or pelvis. No bowel obstruction or evidence of acute bowel wall inflammation. No free fluid or abscess collection. No evidence of acute solid organ abnormality. 8. No evidence of acute osseous fracture or dislocation is seen. Electronically Signed   By: Franki Cabot M.D.   On: 04/27/2021 17:09   CT Hip Left Wo Contrast  Result  Date: 04/27/2021 CLINICAL DATA:  Hip trauma, fracture suspected. EXAM: CT OF THE LEFT HIP WITHOUT CONTRAST TECHNIQUE: Multidetector CT imaging of the left hip was performed according to the standard protocol. Multiplanar CT image reconstructions were also generated. COMPARISON:  None. FINDINGS: No evidence of acute fracture or dislocation at the LEFT hip. 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 Deformity of the LEFT humeral head/neck is compatible with previous injury versus chronic deformity related to the overlying degenerative joint disease. Visualized osseous structures of the LEFT hemipelvis appear intact and normally aligned. Additional degenerative change noted at the LEFT SI joint and within the lower lumbar spine. Visualized soft tissues about the LEFT hip are unremarkable. IMPRESSION: 1. No evidence of acute fracture or dislocation at the LEFT hip. 2. 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 Electronically Signed   By: Franki Cabot M.D.   On: 04/27/2021 17:13   DG Hip Unilat W or Wo Pelvis 2-3 Views Left  Result Date: 04/27/2021 CLINICAL DATA:  [place] 2 weeks ago.  Pain. EXAM: DG HIP (WITH OR WITHOUT PELVIS) 2-3V LEFT COMPARISON:  None. FINDINGS: There is advanced osteoarthritis of the left hip with joint space narrowing, sclerosis, osteophyte and flattening of femoral head. No definite acute regional fracture. 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 IMPRESSION: Advanced osteoarthritis of the left hip. No visible traumatic finding. Flattening of the humeral head which is probably chronic. Electronically Signed   By: Nelson Chimes M.D.   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 TTE in 08/2020 with normal BiV function without significant valve disease. Currently with minimal symptoms except dyspnea on exertion and overall HD stable. -Start metoprolol 25mg  BID for rate control; up-titrate as needed -TTE with normal BiV function in 08/2020; no need to repeat -Will need sleep study as out-patient -Will arrange for EP follow-up after discharge  #Fall/Immobility: Patient presented with significant joint pain with inability to get out of bed for several weeks.  -Management per primary  #HTN: Soft on admission. -Holding BP meds for now  #DMII: -Management per primary  #DVT: -Apixaban being held due to thrombocytopenia   Risk Assessment/Risk Scores:  For questions or updates, please contact [place] Please consult [email or web address] for contact info under    Signed, Freada Bergeron, MD  04/28/2021 9:13 AM

## 2021-04-28 NOTE — Progress Notes (Addendum)
HOSPITAL MEDICINE OVERNIGHT EVENT NOTE    Notified by nursing that patient is continuing to exhibit substantial sinus tachycardia with heart rates upwards of 150 bpm.  Nursing reports the patient is not exhibiting any associated symptoms.  Patient is not short of breath, having chest pain or in pain in general.  Blood pressures are stable.  Patient is expressing clear yellow urine.  Patient is afebrile.  Patient is currently receiving a 1 unit packed red blood cells transfusion but is not actively bleeding.  Chart reviewed.  Because of substantial sinus tachycardia is unclear.  Admitting provider has already attempted to manage patient's pain and hydrate the patient with fluids to no avail.  This possible that the substantial inappropriate tachycardia is due to mediastinal involvement of patient's malignancy.  Obtaining a posttransfusion CBC to ensure hemoglobin is stable.  Additionally obtaining a TSH as well as a repeat EKG and troponin to ensure that there is no evidence of rate related ischemia.  If there is evidence of demand ischemia due to severe tachycardia then will consider initiation of a beta-blocker.  Vernelle Emerald  MD [facility]   ADDENDUM  7AM  All labs unremarkable.  Continue to monitor, treatment plan otherwise per admitting provider.  Sherryll Burger Vadim Centola

## 2021-04-28 NOTE — NC FL2 (Signed)
[place] LEVEL OF CARE SCREENING TOOL     IDENTIFICATION  Patient Name: Crystal Vega Birthdate: Mar 25, 1951 Sex: female Admission Date (Current Location): 04/27/2021  [facility] and [place] Number:  [employer] and Address:  [place] [address] [place]      Provider Number: [id number]  Attending Physician Name and Address:  Murlean Iba, MD  Relative Name and Phone Number:       Current Level of Care: Hospital Recommended Level of Care: [place] Prior Approval Number:    Date Approved/Denied:   PASRR Number: [id number]  Discharge Plan: SNF    Current Diagnoses: Patient Active Problem List   Diagnosis Date Noted   Pressure injury of skin 04/28/2021   UTI (urinary tract infection) 04/28/2021   Fall 04/27/2021   Tachycardia 04/27/2021   Thrombocytopenia ([place]) 04/27/2021   Chronic anemia 04/27/2021   Drug-induced neutropenia ([place]) 02/03/2021   Iron deficiency anemia 02/03/2021   Nausea without vomiting 11/12/2020   Rectal bleeding 08/06/2020   Left leg DVT ([place]) 03/03/2020   Family history of GI tract cancer    Family history of cancer of female genital organ    Port-A-Cath in place 12/25/2019   Colonic mass 12/19/2019   Goals of care, counseling/discussion 12/17/2019   Malignant neoplasm of overlapping sites of left breast in female, estrogen receptor positive ([place]) 11/28/2019   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 ([place]) 11/28/2019   Heme positive stool 02/27/2018   FH: colon cancer 02/27/2018   Constipation 02/27/2018   Simple endometrial 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 04/16/2014   Atypical chest pain 10/10/2013   Essential hypertension, benign 10/10/2013   Morbid obesity ([place]) 10/10/2013   Type 2 diabetes mellitus ([place]) 10/10/2013    Orientation RESPIRATION BLADDER Height & Weight     Self, Time, Situation, Place  Normal Continent Weight: 253 lb 12 oz (115.1  kg) Height:  5\' 3"  (160 cm)  BEHAVIORAL SYMPTOMS/MOOD NEUROLOGICAL BOWEL NUTRITION STATUS      Continent Diet (see dc summary)  AMBULATORY STATUS COMMUNICATION OF NEEDS Skin   Extensive Assist Verbally Normal                       Personal Care Assistance Level of Assistance  Bathing, Feeding, Dressing Bathing Assistance: Limited assistance Feeding assistance: Independent Dressing Assistance: Limited assistance     Functional Limitations Info  Sight, Hearing, Speech Sight Info: Adequate Hearing Info: Adequate Speech Info: Adequate    SPECIAL CARE FACTORS FREQUENCY  PT (By licensed PT), OT (By licensed OT)     PT Frequency: 5x week OT Frequency: 3x week            Contractures Contractures Info: Not present    Additional Factors Info  Code Status, Allergies Code Status Info: Full Allergies Info: NKA           Current Medications (04/28/2021):  This is the current hospital active medication list Current Facility-Administered Medications  Medication Dose Route Frequency Provider Last Rate Last Admin   acetaminophen (TYLENOL) tablet 650 mg  650 mg Oral Q6H PRN Emokpae, Ejiroghene E, MD   650 mg at 04/28/21 8144   Or   acetaminophen (TYLENOL) suppository 650 mg  650 mg Rectal Q6H PRN Emokpae, Ejiroghene E, MD       cefTRIAXone (ROCEPHIN) 2 g in sodium chloride 0.9 % 100 mL IVPB  2 g Intravenous Q24H Johnson, Clanford L, MD 200  mL/hr at 04/28/21 0837 2 g at 04/28/21 1610   Chlorhexidine Gluconate Cloth 2 % PADS 6 each  6 each Topical Daily Emokpae, Ejiroghene E, MD   6 each at 04/28/21 1058   ferrous sulfate tablet 325 mg  325 mg Oral Q breakfast Emokpae, Ejiroghene E, MD   325 mg at 96/04/54 0981   folic acid (FOLVITE) tablet 1 mg  1 mg Oral Daily Emokpae, Ejiroghene E, MD   1 mg at 04/28/21 0827   HYDROmorphone (DILAUDID) injection 1 mg  1 mg Intravenous Q4H PRN Emokpae, Ejiroghene E, MD   1 mg at 04/28/21 1157   insulin aspart (novoLOG) injection 0-5 Units  0-5  Units Subcutaneous QHS Emokpae, Ejiroghene E, MD       insulin aspart (novoLOG) injection 0-9 Units  0-9 Units Subcutaneous TID WC Emokpae, Ejiroghene E, MD   2 Units at 04/28/21 1148   metoprolol tartrate (LOPRESSOR) tablet 25 mg  25 mg Oral BID Freada Bergeron, MD   25 mg at 04/28/21 1058   ondansetron (ZOFRAN) tablet 4 mg  4 mg Oral Q6H PRN Emokpae, Ejiroghene E, MD       Or   ondansetron (ZOFRAN) injection 4 mg  4 mg Intravenous Q6H PRN Emokpae, Ejiroghene E, MD       polyethylene glycol (MIRALAX / GLYCOLAX) packet 17 g  17 g Oral Daily PRN Emokpae, Ejiroghene E, MD       potassium chloride (KLOR-CON) CR tablet 10 mEq  10 mEq Oral Daily Emokpae, Ejiroghene E, MD   10 mEq at 04/28/21 0826   protein supplement (ENSURE MAX) liquid  11 oz Oral Daily Johnson, Clanford L, MD   11 oz at 04/28/21 1239     Discharge Medications: Please see discharge summary for a list of discharge medications.  Relevant Imaging Results:  Relevant Lab Results:   Additional Information SSN: [id number] [place]

## 2021-04-28 NOTE — Progress Notes (Signed)
Initial Nutrition Assessment  DOCUMENTATION CODES:   Morbid obesity  INTERVENTION:  Ensure Max daily (150 kcal, 30 gr protein).  Chopped meats all meals   Provide tray set up  NUTRITION DIAGNOSIS:   Inadequate oral intake related to poor appetite as evidenced by per patient/family report (bites of meal).   GOAL:  Patient will meet greater than or equal to 90% of their needs   MONITOR:  PO intake, Supplement acceptance, Labs, Skin, Weight trends  REASON FOR ASSESSMENT:   Malnutrition Screening Tool    ASSESSMENT: Patient is an obese 70 yo female with hx of DM2, HTN, Anemia, breast cancer and arthritis. Presents with joint pain after a fall, tachycardia. Cardiology following. CT of left hip-no acute fracture. Advanced DJD. Advanced Osteoarthritis.   Patient is receiving a regular diet with 1500 ml fluid restriction. Hyponatremia on admission. Patient reports a few bites of sausage for breakfast. Prefers for meats to be chopped due to feeding difficulty. Encouraged intake each meal to support recovery and preserve lean body mass.   Patient working with PT. Complains of pain with movement.   Patient weight 115-121 kg range since August.   Medications reviewed and include: ferrous sulfate, folic acid, insulin, Klor-con, lopressor.  Labs: BMP Latest Ref Rng & Units 04/28/2021 04/27/2021 03/31/2021  Glucose 70 - 99 mg/dL 148(H) 147(H) 135(H)  BUN 8 - 23 mg/dL 15 16 26(H)  Creatinine 0.44 - 1.00 mg/dL 0.55 0.55 0.74  Sodium 135 - 145 mmol/L 127(L) 129(L) 134(L)  Potassium 3.5 - 5.1 mmol/L 3.6 3.8 3.9  Chloride 98 - 111 mmol/L 95(L) 94(L) 101  CO2 22 - 32 mmol/L 23 25 27   Calcium 8.9 - 10.3 mg/dL 8.3(L) 8.6(L) 8.4(L)      NUTRITION - FOCUSED PHYSICAL EXAM:  Flowsheet Row Most Recent Value  Orbital Region No depletion  Upper Arm Region No depletion  Thoracic and Lumbar Region No depletion  Buccal Region No depletion  Temple Region No depletion  Clavicle Bone Region No  depletion  Clavicle and Acromion Bone Region No depletion  Scapular Bone Region No depletion  Dorsal Hand No depletion  Patellar Region Unable to assess  Anterior Thigh Region Unable to assess  Posterior Calf Region Unable to assess  Edema (RD Assessment) Mild  [generalized edema]  Hair Reviewed  Eyes Reviewed  Mouth Reviewed  Skin Reviewed  Nails Reviewed      Diet Order:   Diet Order             Diet regular Room service appropriate? Yes; Fluid consistency: Thin; Fluid restriction: 1500 mL Fluid  Diet effective now                   EDUCATION NEEDS:  Education needs have been addressed  Skin:  Skin Assessment: Reviewed RN Assessment (non pressure wound to left breast related to breast cancer)  Last BM:  11/28  Height:   Ht Readings from Last 1 Encounters:  04/27/21 5\' 3"  (1.6 m)    Weight:   Wt Readings from Last 1 Encounters:  04/28/21 115.1 kg    Ideal Body Weight:   52 kg  BMI:  Body mass index is 44.95 kg/m.  Estimated Nutritional Needs:   Kcal:  1700-1800  Protein:  90-95 gr  Fluid:  1500 ml daily   Colman Cater MS,RD,CSG,LDN Contact: Shea Evans

## 2021-04-28 NOTE — TOC Initial Note (Addendum)
Transition of Care [facility]) - Initial/Assessment Note    Patient Details  Name: Crystal Vega MRN: [id number] Date of Birth: Jan 02, 1951  [facility] CM/SW Contact:    Shade Flood, LCSW Phone Number: 04/28/2021, 2:03 PM  Clinical Narrative:                  Pt admitted from home. PT recommending SNF rehab at dc. Met with pt today to assess and review dc planning. Pt lives with her husband at home and is usually independent in ADLs. She uses a cane for ambulation. Discussed SNF rehab recommendation with pt and she is agreeable. Reviewed CMS provider options and will refer as requested.  Pt will need insurance auth for SNF. MD anticipating pt could be medically ready for dc in 2-3 days.  TOC will follow and assist with dc planning.  Expected Discharge Plan: Skilled Nursing Facility Barriers to Discharge: Continued Medical Work up   Patient Goals and CMS Choice Patient states their goals for this hospitalization and ongoing recovery are:: get better CMS Medicare.gov Compare Post Acute Care list provided to:: Patient Choice offered to / list presented to : Patient  Expected Discharge Plan and Services Expected Discharge Plan: [place] In-house Referral: Clinical Social Work   Post Acute Care Choice: [place] Living arrangements for the past 2 months: [place]                                      Prior Living Arrangements/Services Living arrangements for the past 2 months: Single Family Home Lives with:: Spouse Patient language and need for interpreter reviewed:: Yes Do you feel safe going back to the place where you live?: Yes      Need for Family Participation in Patient Care: No (Comment) Care giver support system in place?: Yes (comment) Current home services: DME Criminal Activity/Legal Involvement Pertinent to Current Situation/Hospitalization: No - Comment as needed  Activities of Daily Living Home  Assistive Devices/Equipment: Cane (specify quad or straight), Wheelchair, Environmental consultant (specify type) ADL Screening (condition at time of admission) Patient's cognitive ability adequate to safely complete daily activities?: Yes Is the patient deaf or have difficulty hearing?: No Does the patient have difficulty seeing, even when wearing glasses/contacts?: No Does the patient have difficulty concentrating, remembering, or making decisions?: No Patient able to express need for assistance with ADLs?: Yes Does the patient have difficulty dressing or bathing?: Yes Independently performs ADLs?: No Communication: Independent Dressing (OT): Needs assistance Is this a change from baseline?: Pre-admission baseline Grooming: Needs assistance Is this a change from baseline?: Pre-admission baseline Feeding: Needs assistance Is this a change from baseline?: Pre-admission baseline Bathing: Dependent Is this a change from baseline?: Pre-admission baseline Toileting: Dependent Is this a change from baseline?: Pre-admission baseline In/Out Bed: Dependent Is this a change from baseline?: Pre-admission baseline Walks in Home: Dependent Is this a change from baseline?: Pre-admission baseline Does the patient have difficulty walking or climbing stairs?: Yes Weakness of Legs: Both Weakness of Arms/Hands: Left  Permission Sought/Granted Permission sought to share information with : Facility Art therapist granted to share information with : Yes, Verbal Permission Granted     Permission granted to share info w AGENCY: snfs        Emotional Assessment Appearance:: Appears stated age Attitude/Demeanor/Rapport: Engaged Affect (typically observed): Pleasant Orientation: : Oriented to Self, Oriented to Place, Oriented to  Time, Oriented to Situation Alcohol / Substance Use: Not Applicable Psych Involvement: No (comment)  Admission diagnosis:  Fall [W19.XXXA] Injury of left knee, initial  encounter [S89.92XA] Fall, initial encounter [W19.XXXA] Anemia, unspecified type [D64.9] Patient Active Problem List   Diagnosis Date Noted   Pressure injury of skin 04/28/2021   UTI (urinary tract infection) 04/28/2021   Fall 04/27/2021   Tachycardia 04/27/2021   Thrombocytopenia ([place]) 04/27/2021   Chronic anemia 04/27/2021   Drug-induced neutropenia ([facility]) 02/03/2021   Iron deficiency anemia 02/03/2021   Nausea without vomiting 11/12/2020   Rectal bleeding 08/06/2020   Left leg DVT ([place]) 03/03/2020   Family history of GI tract cancer    Family history of cancer of female genital organ    Port-A-Cath in place 12/25/2019   Colonic mass 12/19/2019   Goals of care, counseling/discussion 12/17/2019   Malignant neoplasm of overlapping sites of left breast in female, estrogen receptor positive ([place]) 11/28/2019   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 ([place]) 11/28/2019   Heme positive stool 02/27/2018   FH: colon cancer 02/27/2018   Constipation 02/27/2018   Simple endometrial 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 04/16/2014   Atypical chest pain 10/10/2013   Essential hypertension, benign 10/10/2013   Morbid obesity ([place]) 10/10/2013   Type 2 diabetes mellitus ([place]) 10/10/2013   PCP:  Lemmie Evens, MD Pharmacy:   [id number] [place] [id number]: [phone]: [phone]    Social Determinants of Health (SDOH) Interventions    Readmission Risk Interventions Readmission Risk Prevention Plan 04/28/2021  Transportation Screening Complete  Home Care Screening Complete  Medication Review (RN CM) Complete  Some recent data might be hidden

## 2021-04-28 NOTE — ED Provider Notes (Signed)
Care transferred to me. NO obvious fractures. However does have what is likely a traumatic effusion. No redness on exam. Still tachycardic, will give fluids and pain control. Will admit for PT, pain control, further workup and possibly placement.    Sherwood Gambler, MD 04/28/21 0100

## 2021-04-28 NOTE — Plan of Care (Signed)
  Problem: Acute Rehab PT Goals(only PT should resolve) Goal: Pt Will Go Supine/Side To Sit Outcome: Progressing Flowsheets (Taken 04/28/2021 1355) Pt will go Supine/Side to Sit:  with moderate assist  with minimal assist Goal: Patient Will Transfer Sit To/From Stand Outcome: Progressing Flowsheets (Taken 04/28/2021 1355) Patient will transfer sit to/from stand: with moderate assist Goal: Pt Will Transfer Bed To Chair/Chair To Bed Outcome: Progressing Flowsheets (Taken 04/28/2021 1355) Pt will Transfer Bed to Chair/Chair to Bed: with mod assist Goal: Pt Will Ambulate Outcome: Progressing Flowsheets (Taken 04/28/2021 1355) Pt will Ambulate:  10 feet  with moderate assist  with maximum assist  with rolling walker   1:56 PM, 04/28/21 Lonell Grandchild, MPT Physical Therapist with [facility] 336 [phone] office [phone] mobile phone

## 2021-04-28 NOTE — Progress Notes (Addendum)
PROGRESS NOTE   Crystal Vega  ZOX:[id number] DOB: 1950/08/06 DOA: 04/27/2021 PCP: Lemmie Evens, MD   No chief complaint on file.  Level of care: Telemetry  Brief Admission History:  70 y.o. female with medical history significant for advanced breast cancer undergoing chemotherapy, hypertension, diabetes, colon mass, iron deficiency anemia.  Patient was brought to the ED by EMS with reports of left leg pain.  Reports she was getting out of bed 2 weeks ago, she put her legs on the floor and slid down landing on her butt.  She did not hit her head or and knee when she fell.  Since the fall she has not been able to ambulate.  She has not been able to lift her left leg due to pain.  Prior to the fall she has had chronic problems-with pain involving her left lower extremity hip and knees.  She denies any change in stools, reports her stools are black chronically as she is on iron tablets.  No vomiting of blood and no blood in her stools.  Denies palpitations.  No chest pain, no  difficulty breathing.  She reports she has been lying in bed for the past 2 weeks and her husband brings food for her, but she has not been able to eat well.  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 -Patient is unable to ambulate and complains of  severe pain patient is unable to ambulate  - consult to orthopedics as she might be a candidate for knee injection or a knee brace  - Physical therapy consultation  - tylenol ordered around the clock for inflammation  - pain management ordered  Atrial tachycardia  - HR up to 150-160 range - consult to cardiology and started on metoprolol 25 mg BID - plan to have EP cardiology follow up after discharge  - hopefully BP will tolerate the metoprolol - Manual BP readings only as machine readings are inaccurate due to body habitus  UTI - reports having urinary frequency  - follow culture and sensitivity - IV ceftriaxone ordered  Left leg DVT - unfortunately had to temporarily hold apixaban due to thrombocytopenia   Thrombocytopenia  - secondary to chemotherapy - check CBC in aM   Malignant Right Breast cancer  - she is being treated with chemotherapy by Dr. Delton Coombes  Type 2 diabetes mellitus  - continue SSI coverage and frequent cBG monitoring CBG (last 3)  Recent Labs    04/28/21 0722 04/28/21 1144 04/28/21 1627  GLUCAP 132* 155* 145*   Right colon adenocarcinoma - per records they are holding off on surgical management because of her advanced breast cancer.   Anemia of neoplastic disease - Transfused 1 unit PRBC on 11/28 Hg now up to 9.1 - CBC in am  Leukocytosis - possibly  due to UTI - treating UTI - repeat CXR in AM after IV hydration - follow up procalcitonin in aM - follow blood and urine cultures - cbc with differential in AM   Hyponatremia - asymptomatic  - due to SIADH associated with malignancy - free water restriction  - BMP in AM   DVT prophylaxis:  SCDs Code Status: Full  Family Communication: discussed plan of care with patient 11/29 Disposition: TBD but anticipate need for SNF  Status is: Inpatient Remains inpatient appropriate because: uncontrolled pain    Consultants:  PT orthopedics  Procedures:  N/a  Antimicrobials:  Ceftriaxone  11/29>>   Subjective: Pt reports uncontrolled pain in left knee and leg, unable to bear weight, unable to ambulate, crying out in pain and distress.   Objective: Vitals:   04/28/21 1400 04/28/21 1440 04/28/21 1527 04/28/21 1631  BP: 100/75 (!) 82/64 (!) 108/58   Pulse: 93 92    Resp: 17     Temp:  98.3 F (36.8 C)    TempSrc:  Oral    SpO2: 100% 100%    Weight:    116.6 kg  Height:    5\' 3"  (1.6 m)    Intake/Output Summary (Last 24 hours) at 04/28/2021 1654 Last data filed at 04/28/2021 0721 Gross per 24 hour  Intake 1509.51 ml  Output --  Net 1509.51 ml   Filed Weights   04/27/21 2154 04/28/21 0400 04/28/21 1631  Weight: 115.1 kg 115.1 kg 116.6 kg   Examination:  General exam: chronically ill appearing, Appears calm and comfortable  Respiratory system: port in place left chest wall, no increased work of breathing. Cardiovascular system: normal S1 & S2 heard. No JVD, murmurs, rubs, gallops or clicks. No pedal edema. Gastrointestinal system: Abdomen is nondistended, soft and nontender. No organomegaly or masses felt. Normal bowel sounds heard. Central nervous system: Alert and oriented. No focal neurological deficits. Extremities: very large left knee effusion seen with crepitus, exam limited by severe pain.  Skin: No rashes, lesions or ulcers Psychiatry: Judgement and insight appear normal. Mood & affect appropriate.   Data Reviewed: I have personally reviewed following labs and imaging studies  CBC: Recent Labs  Lab 04/27/21 1152 04/28/21 0432  WBC 9.1 14.8*  NEUTROABS 7.0  --   HGB 7.3* 8.1*  HCT 23.2* 25.7*  MCV 90.6 89.9  PLT 56* 52*    Basic Metabolic Panel: Recent Labs  Lab 04/27/21 1152 04/28/21 0432  NA 129* 127*  K 3.8 3.6  CL 94* 95*  CO2 25 23  GLUCOSE 147* 148*  BUN 16 15  CREATININE 0.55 0.55  CALCIUM 8.6* 8.3*    GFR: Estimated Creatinine Clearance: 81.8 mL/min (by C-G formula based on SCr of 0.55 mg/dL).  Liver Function  Tests: Recent Labs  Lab 04/27/21 1152  AST 26  ALT 12  ALKPHOS 103  BILITOT 0.6  PROT 7.2  ALBUMIN 2.3*    CBG: Recent Labs  Lab 04/27/21 2232 04/28/21 0722 04/28/21 1144 04/28/21 1627  GLUCAP 143* 132* 155* 145*    Recent Results (from the past 240 hour(s))  Resp Panel by RT-PCR (Flu A&B, Covid) Nasopharyngeal Swab     Status: None   Collection Time: 04/27/21  6:07 PM   Specimen: Nasopharyngeal Swab; Nasopharyngeal(NP) swabs in vial transport medium  Result Value Ref Range Status   SARS Coronavirus 2 by RT PCR NEGATIVE NEGATIVE Final    Comment: (NOTE) SARS-CoV-2 target nucleic acids are NOT DETECTED.  The SARS-CoV-2 RNA is  generally detectable in upper respiratory specimens during the acute phase of infection. The lowest concentration of SARS-CoV-2 viral copies this assay can detect is 138 copies/mL. A negative result does not preclude SARS-Cov-2 infection and should not be used as the sole basis for treatment or other patient management decisions. A negative result may occur with  improper specimen collection/handling, submission of specimen other than nasopharyngeal swab, presence of viral mutation(s) within the areas targeted by this assay, and inadequate number of viral copies(<138 copies/mL). A negative result must be combined with clinical observations, patient history, and epidemiological information. The expected result is Negative.  Fact Sheet for Patients:  EntrepreneurPulse.com.au  Fact Sheet for Healthcare Providers:  IncredibleEmployment.be  This test is no t yet approved or cleared by the [place] FDA and  has been authorized for detection and/or diagnosis of SARS-CoV-2 by FDA under an Emergency Use Authorization (EUA). This EUA will remain  in effect (meaning this test can be used) for the duration of the COVID-19 declaration under Section 564(b)(1) of the Act, 21 U.S.C.section 360bbb-3(b)(1), unless the  authorization is terminated  or revoked sooner.       Influenza A by PCR NEGATIVE NEGATIVE Final   Influenza B by PCR NEGATIVE NEGATIVE Final    Comment: (NOTE) The Xpert Xpress SARS-CoV-2/FLU/RSV plus assay is intended as an aid in the diagnosis of influenza from Nasopharyngeal swab specimens and should not be used as a sole basis for treatment. Nasal washings and aspirates are unacceptable for Xpert Xpress SARS-CoV-2/FLU/RSV testing.  Fact Sheet for Patients: EntrepreneurPulse.com.au  Fact Sheet for Healthcare Providers: IncredibleEmployment.be  This test is not yet approved or cleared by the [place] FDA and has been authorized for detection and/or diagnosis of SARS-CoV-2 by FDA under an Emergency Use Authorization (EUA). This EUA will remain in effect (meaning this test can be used) for the duration of the COVID-19 declaration under Section 564(b)(1) of the Act, 21 U.S.C. section 360bbb-3(b)(1), unless the authorization is terminated or revoked.  Performed at [facility], [address], [place], [place] [address]   MRSA Next Gen by PCR, Nasal     Status: None   Collection Time: 04/27/21  9:31 PM   Specimen: Nasal Mucosa; Nasal Swab  Result Value Ref Range Status   MRSA by PCR Next Gen NOT DETECTED NOT DETECTED Final    Comment: (NOTE) The GeneXpert MRSA Assay (FDA approved for NASAL specimens only), is one component of a comprehensive MRSA colonization surveillance program. It is not intended to diagnose MRSA infection nor to guide or monitor treatment for MRSA infections. Test performance is not FDA approved in patients less than 64 years old. Performed at [facility], [address] [place], [place] [address]   Culture, blood (routine x 2)     Status: None (Preliminary result)   Collection Time: 04/28/21  7:49 AM   Specimen: BLOOD  Result Value Ref Range Status   Specimen Description BLOOD RIGHT PORT  Final   Special  Requests   Final    BOTTLES DRAWN AEROBIC AND ANAEROBIC Blood Culture adequate volume Performed at [facility], [address], [place],  [address]    Culture PENDING  Incomplete   Report Status PENDING  Incomplete  Culture, blood (routine x 2)     Status: None (Preliminary result)   Collection Time: 04/28/21  8:34 AM   Specimen: Right Antecubital; Blood  Result Value Ref Range Status   Specimen Description RIGHT ANTECUBITAL  Final   Special Requests  Final    BOTTLES DRAWN AEROBIC AND ANAEROBIC Blood Culture adequate volume Performed at [facility], [address], [place], [place] [address]    Culture PENDING  Incomplete   Report Status PENDING  Incomplete     Radiology Studies: DG Chest 1 View  Result Date: 04/27/2021 CLINICAL DATA:  [place] 2 weeks ago EXAM: CHEST  1 VIEW COMPARISON:  07/28/2020 FINDINGS: Power port on the right with the tip in the right atrium, possibly approaching the tricuspid valve. No evidence of heart failure. Patient has taken a poor inspiration. No traumatic regional finding. IMPRESSION: Poor inspiration. No traumatic finding. Power port tip in the right atrium, possibly near the tricuspid valve. Electronically Signed   By: Nelson Chimes M.D.   On: 04/27/2021 13:18   DG Knee 1-2 Views Left  Result Date: 04/27/2021 CLINICAL DATA:  [place] 2 weeks ago.  Pain and limited range of motion. EXAM: LEFT KNEE - 1-2 VIEW COMPARISON:  None. FINDINGS: There is tricompartmental osteoarthritis with a joint effusion. No evidence of fracture or focal bone lesion. IMPRESSION: Osteoarthritis and joint effusion. No traumatic bone finding visible. Electronically Signed   By: Nelson Chimes M.D.   On: 04/27/2021 13:16   CT Angio Chest PE W and/or Wo Contrast  Result Date: 04/27/2021 CLINICAL DATA:  PE suspected, high probability. LEFT leg pain and weakness status post fall 2 weeks ago. History of metastatic LEFT breast cancer, per clinical data provided on previous CT report.  EXAM: CT ANGIOGRAPHY CHEST CT ABDOMEN AND PELVIS WITH CONTRAST TECHNIQUE: Multidetector CT imaging of the chest was performed using the standard protocol during bolus administration of intravenous contrast. Multiplanar CT image reconstructions and MIPs were obtained to evaluate the vascular anatomy. Multidetector CT imaging of the abdomen and pelvis was performed using the standard protocol during bolus administration of intravenous contrast. CONTRAST:  172mL OMNIPAQUE IOHEXOL 350 MG/ML SOLN COMPARISON:  CT chest abdomen and pelvis dated 04/09/2021. FINDINGS: CTA CHEST FINDINGS Cardiovascular: Some of the most peripheral subsegmental pulmonary arteries are difficult to definitively characterize due to patient breathing motion artifact, however, there is no pulmonary embolism seen within the main, lobar or central segmental pulmonary arteries bilaterally. No thoracic aortic aneurysm or evidence of aortic dissection. No pericardial effusion. Mediastinum/Nodes: Mediastinal and perihilar lymphadenopathy, including a 1.2 cm short axis lymph node within the anterior mediastinum and a 1.6 cm short axis lymph node within the RIGHT lower paratracheal mediastinum, not significantly changed compared to the recent chest CT of 04/09/2021, compatible with metastatic lymphadenopathy. Additional conglomerate lymphadenopathy is also again seen within the LEFT supraclavicular region, incompletely imaged. z Esophagus is unremarkable. Trachea and central bronchi are unremarkable. Lungs/Pleura: Lungs are clear.  No pleural effusion or pneumothorax. Musculoskeletal: No acute appearing osseous abnormality. Lytic-appearing lesion within the T6 vertebral body is suspicious for osseous metastasis. Additional destructive/lytic changes within the sternum, also suggesting metastatic disease. Conglomerate lymphadenopathy within the LEFT axilla, and mass/lymphadenopathy in the axillary tail region of the LEFT breast measures 4 cm greatest  dimension, all of which is not significantly changed compared to the recent chest CT of 04/09/2021. Additional milder lymphadenopathy within the RIGHT axilla is redemonstrated and stable. Review of the MIP images confirms the above findings. CT ABDOMEN and PELVIS FINDINGS Hepatobiliary: Numerous small hypodense lesions within the bilateral liver lobes, as also described on the earlier CT abdomen report of 12/12/2019, presumably numerous small liver metastases. Single stone within the otherwise normal-appearing gallbladder. No bile duct dilatation is seen. Pancreas: Unremarkable. No pancreatic  ductal dilatation or surrounding inflammatory changes. Spleen: Normal in size without focal abnormality. Adrenals/Urinary Tract: Adrenals are unremarkable. Bilateral renal cysts. Kidneys are otherwise unremarkable without suspicious mass, stone or hydronephrosis. No ureteral or bladder calculi are identified. Bladder is unremarkable. Stomach/Bowel: No dilated large or small bowel loops. Scattered diverticulosis of the descending and sigmoid colon but no focal inflammatory change to suggest acute diverticulitis. No evidence of acute bowel wall inflammation. Stomach is unremarkable. Appendix is normal. Vascular/Lymphatic: Vascular structures of the abdomen and pelvis are unremarkable. Conglomerate lymphadenopathy above the pancreatic head and adjacent to the porta hepatis, as previously described, presumed lymph node metastases. Reproductive: Calcified uterine fibroids. No adnexal mass or free fluid. Other: No free fluid or abscess collection. No free intraperitoneal air. Musculoskeletal: No acute findings. Degenerative spondylosis of the lumbar spine, mild to moderate in degree. 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 Review of the MIP images confirms the above findings. IMPRESSION: 1. No acute findings within the chest. No pulmonary embolism is seen, with mild study limitations detailed above. No evidence of pneumonia or pulmonary edema.  2. Metastatic lymphadenopathy is redemonstrated within the mediastinum, bilateral axillae, and supraclavicular LEFT neck. Also redemonstrated is the mass versus conglomerate lymphadenopathy in the axillary tail region of the LEFT breast. These findings are stable compared to the recent chest CT of 04/09/2021. 3. Lytic-appearing lesion within the T6 vertebral body, and destructive/lytic changes within the sternum, suspicious for metastatic osseous disease. Consider nuclear medicine bone scan or PET scan for confirmation. 4. Numerous small hypodense lesions within the bilateral liver lobes, as also described on the earlier CT abdomen report of 12/12/2019, presumably liver metastases. 5. Cholelithiasis without evidence of acute cholecystitis. 6. Colonic diverticulosis without evidence of acute diverticulitis. 7. No acute findings within the abdomen or pelvis. No bowel obstruction or evidence of acute bowel wall inflammation. No free fluid or abscess collection. No evidence of acute solid organ abnormality. 8. No evidence of acute osseous fracture or dislocation is seen. Electronically Signed   By: Franki Cabot M.D.   On: 04/27/2021 17:09   CT Knee Left Wo Contrast  Result Date: 04/27/2021 CLINICAL DATA:  Fall 2 weeks ago comment limited range of motion EXAM: CT OF THE LEFT KNEE WITHOUT CONTRAST TECHNIQUE: Multidetector CT imaging of the left knee was performed according to the standard protocol. Multiplanar CT image reconstructions were also generated. COMPARISON:  Radiographs 04/27/2021 FINDINGS: Bones/Joint/Cartilage The knee was imaged in a moderately flexed orientation. There is marked proliferative spurring all 3 compartments with associated cortical irregularity which can reduce sensitivity for subtle fractures. I do not see a well-defined cortical discontinuity to indicate fracture. There is a moderate knee effusion in the suprapatellar bursa. Ligaments Suboptimally assessed by CT. Muscles and Tendons Mild  regional muscular atrophy diffusely. Soft tissues Atherosclerosis. Subcutaneous edema anterior to the patella, cannot exclude prepatellar bruising. IMPRESSION: 1. 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 No fracture is identified, although sensitivity for subtle fractures is reduced due to the degree of spurring and associated cortical irregularities. 2. Moderate knee effusion. 3. Atherosclerosis. 4. Subcutaneous edema anterior to the patella. Electronically Signed   By: Van Clines M.D.   On: 04/27/2021 17:15   CT ABDOMEN PELVIS W CONTRAST  Result Date: 04/27/2021 CLINICAL DATA:  PE suspected, high probability. LEFT leg pain and weakness status post fall 2 weeks ago. History of metastatic LEFT breast cancer, per clinical data provided on previous CT report. EXAM: CT ANGIOGRAPHY CHEST CT ABDOMEN AND PELVIS WITH CONTRAST  TECHNIQUE: Multidetector CT imaging of the chest was performed using the standard protocol during bolus administration of intravenous contrast. Multiplanar CT image reconstructions and MIPs were obtained to evaluate the vascular anatomy. Multidetector CT imaging of the abdomen and pelvis was performed using the standard protocol during bolus administration of intravenous contrast. CONTRAST:  111mL OMNIPAQUE IOHEXOL 350 MG/ML SOLN COMPARISON:  CT chest abdomen and pelvis dated 04/09/2021. FINDINGS: CTA CHEST FINDINGS Cardiovascular: Some of the most peripheral subsegmental pulmonary arteries are difficult to definitively characterize due to patient breathing motion artifact, however, there is no pulmonary embolism seen within the main, lobar or central segmental pulmonary arteries bilaterally. No thoracic aortic aneurysm or evidence of aortic dissection. No pericardial effusion. Mediastinum/Nodes: Mediastinal and perihilar lymphadenopathy, including a 1.2 cm short axis lymph node within the anterior mediastinum and a 1.6 cm short axis lymph node within the RIGHT lower  paratracheal mediastinum, not significantly changed compared to the recent chest CT of 04/09/2021, compatible with metastatic lymphadenopathy. Additional conglomerate lymphadenopathy is also again seen within the LEFT supraclavicular region, incompletely imaged. z Esophagus is unremarkable. Trachea and central bronchi are unremarkable. Lungs/Pleura: Lungs are clear.  No pleural effusion or pneumothorax. Musculoskeletal: No acute appearing osseous abnormality. Lytic-appearing lesion within the T6 vertebral body is suspicious for osseous metastasis. Additional destructive/lytic changes within the sternum, also suggesting metastatic disease. Conglomerate lymphadenopathy within the LEFT axilla, and mass/lymphadenopathy in the axillary tail region of the LEFT breast measures 4 cm greatest dimension, all of which is not significantly changed compared to the recent chest CT of 04/09/2021. Additional milder lymphadenopathy within the RIGHT axilla is redemonstrated and stable. Review of the MIP images confirms the above findings. CT ABDOMEN and PELVIS FINDINGS Hepatobiliary: Numerous small hypodense lesions within the bilateral liver lobes, as also described on the earlier CT abdomen report of 12/12/2019, presumably numerous small liver metastases. Single stone within the otherwise normal-appearing gallbladder. No bile duct dilatation is seen. Pancreas: Unremarkable. No pancreatic ductal dilatation or surrounding inflammatory changes. Spleen: Normal in size without focal abnormality. Adrenals/Urinary Tract: Adrenals are unremarkable. Bilateral renal cysts. Kidneys are otherwise unremarkable without suspicious mass, stone or hydronephrosis. No ureteral or bladder calculi are identified. Bladder is unremarkable. Stomach/Bowel: No dilated large or small bowel loops. Scattered diverticulosis of the descending and sigmoid colon but no focal inflammatory change to suggest acute diverticulitis. No evidence of acute bowel wall  inflammation. Stomach is unremarkable. Appendix is normal. Vascular/Lymphatic: Vascular structures of the abdomen and pelvis are unremarkable. Conglomerate lymphadenopathy above the pancreatic head and adjacent to the porta hepatis, as previously described, presumed lymph node metastases. Reproductive: Calcified uterine fibroids. No adnexal mass or free fluid. Other: No free fluid or abscess collection. No free intraperitoneal air. Musculoskeletal: No acute findings. Degenerative spondylosis of the lumbar spine, mild to moderate in degree. 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 Review of the MIP images confirms the above findings. IMPRESSION: 1. No acute findings within the chest. No pulmonary embolism is seen, with mild study limitations detailed above. No evidence of pneumonia or pulmonary edema. 2. Metastatic lymphadenopathy is redemonstrated within the mediastinum, bilateral axillae, and supraclavicular LEFT neck. Also redemonstrated is the mass versus conglomerate lymphadenopathy in the axillary tail region of the LEFT breast. These findings are stable compared to the recent chest CT of 04/09/2021. 3. Lytic-appearing lesion within the T6 vertebral body, and destructive/lytic changes within the sternum, suspicious for metastatic osseous disease. Consider nuclear medicine bone scan or PET scan for confirmation. 4. Numerous small hypodense lesions within  the bilateral liver lobes, as also described on the earlier CT abdomen report of 12/12/2019, presumably liver metastases. 5. Cholelithiasis without evidence of acute cholecystitis. 6. Colonic diverticulosis without evidence of acute diverticulitis. 7. No acute findings within the abdomen or pelvis. No bowel obstruction or evidence of acute bowel wall inflammation. No free fluid or abscess collection. No evidence of acute solid organ abnormality. 8. No evidence of acute osseous fracture or dislocation is seen. Electronically Signed   By: Franki Cabot M.D.   On: 04/27/2021  17:09   CT Hip Left Wo Contrast  Result Date: 04/27/2021 CLINICAL DATA:  Hip trauma, fracture suspected. EXAM: CT OF THE LEFT HIP WITHOUT CONTRAST TECHNIQUE: Multidetector CT imaging of the left hip was performed according to the standard protocol. Multiplanar CT image reconstructions were also generated. COMPARISON:  None. FINDINGS: No evidence of acute fracture or dislocation at the LEFT hip. 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 Deformity of the LEFT humeral head/neck is compatible with previous injury versus chronic deformity related to the overlying degenerative joint disease. Visualized osseous structures of the LEFT hemipelvis appear intact and normally aligned. Additional degenerative change noted at the LEFT SI joint and within the lower lumbar spine. Visualized soft tissues about the LEFT hip are unremarkable. IMPRESSION: 1. No evidence of acute fracture or dislocation at the LEFT hip. 2. 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 Electronically Signed   By: Franki Cabot M.D.   On: 04/27/2021 17:13   DG Hip Unilat W or Wo Pelvis 2-3 Views Left  Result Date: 04/27/2021 CLINICAL DATA:  [place] 2 weeks ago.  Pain. EXAM: DG HIP (WITH OR WITHOUT PELVIS) 2-3V LEFT COMPARISON:  None. FINDINGS: There is advanced osteoarthritis of the left hip with joint space narrowing, sclerosis, osteophyte and flattening of femoral head. No definite acute regional fracture. 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 IMPRESSION: Advanced osteoarthritis of the left hip. No visible traumatic finding. Flattening of the humeral head which is probably chronic. Electronically Signed   By: Nelson Chimes M.D.   On: 04/27/2021 13:17    Scheduled Meds:  Chlorhexidine Gluconate Cloth  6 each Topical Daily   ferrous sulfate  325 mg Oral Q  breakfast   folic acid  1 mg Oral Daily   insulin aspart  0-5 Units Subcutaneous QHS   insulin aspart  0-9 Units Subcutaneous TID WC   metoprolol tartrate  25 mg Oral BID   potassium chloride  10 mEq Oral Daily   Ensure Max Protein  11 oz Oral Daily   Continuous Infusions:  cefTRIAXone (ROCEPHIN)  IV 2 g (04/28/21 0837)     LOS: 1 day   Time spent: 56 mins   Chester Sibert Wynetta Emery, MD How to contact the [facility] Attending or Consulting provider [place] or covering provider during after hours [place], for this patient?  Check the care team in [facility] and look for a) attending/consulting [facility] provider listed and b) the [facility] team listed Log into [email or web address] and use [place]'s universal password to access. If you do not have the password, please contact the hospital operator. Locate the [facility] provider you are looking for under [facility] and page to a number that you can be directly reached. If you still have difficulty reaching the provider, please page the [facility] (Director on Call) for the  Hospitalists listed on amion for assistance.  04/28/2021, 4:54 PM

## 2021-04-28 NOTE — Evaluation (Signed)
Physical Therapy Evaluation Patient Details Name: Crystal Vega MRN: [id number] DOB: 01-Feb-1951 Today's Date: 04/28/2021  History of Present Illness  Crystal Vega is a 70 y.o. female with medical history significant for breast cancer undergoing chemotherapy, hypertension, diabetes, colon mass, iron deficiency anemia.  Patient was brought to the ED by EMS with reports of left leg pain.  Reports she was getting out of bed 2 weeks ago, she put her legs on the floor and slid down landing on her butt.  She did not hit her head or and knee when she fell.  Since the fall she has not been able to ambulate.  She has not been able to lift her left leg due to pain.  Prior to the fall she has had chronic problems-with pain involving her left lower extremity hip and knees.  She denies any change in stools, reports her stools are black chronically as she is on iron tablets.  No vomiting of blood and no blood in her stools.  Denies palpitations.  No chest pain , no  difficulty breathing.   Clinical Impression  Patient demonstrates slow labored movement for attempting to sit up at bedside with limited use of LUE due to weakness, c/o severe LLE pain with any movement and only able to tolerate long sitting for up to 30 seconds before requesting to lie down - RN notified.  Patient will benefit from continued skilled physical therapy in hospital and recommended venue below to increase strength, balance, endurance for safe ADLs and gait.      Recommendations for follow up therapy are one component of a multi-disciplinary discharge planning process, led by the attending physician.  Recommendations may be updated based on patient status, additional functional criteria and insurance authorization.  Follow Up Recommendations Skilled nursing-short term rehab (<3 hours/day)    Assistance Recommended at Discharge Intermittent Supervision/Assistance  Functional Status Assessment Patient has had a recent decline in their  functional status and demonstrates the ability to make significant improvements in function in a reasonable and predictable amount of time.  Equipment Recommendations  None recommended by PT    Recommendations for Other Services       Precautions / Restrictions Precautions Precautions: Fall Restrictions Weight Bearing Restrictions: No      Mobility  Bed Mobility Overal bed mobility: Needs Assistance Bed Mobility: Supine to Sit     Supine to sit: Max assist     General bed mobility comments: Max assist for long sitting in bed, limited mostly due to c/o severe pain LLE    Transfers                        Ambulation/Gait                  Stairs            Wheelchair Mobility    Modified Rankin (Stroke Patients Only)       Balance Overall balance assessment: Needs assistance Sitting-balance support: Feet unsupported;Bilateral upper extremity supported Sitting balance-Leahy Scale: Poor Sitting balance - Comments: long sitting in bed                                     Pertinent Vitals/Pain Pain Assessment: Faces Faces Pain Scale: Hurts whole lot Pain Location: mostly LLE and some in LUE Pain Descriptors / Indicators: Aching;Grimacing;Guarding;Sore;Moaning Pain Intervention(s): Limited activity within patient's  tolerance;Monitored during session;Repositioned    Home Living Family/patient expects to be discharged to:: Private residence Living Arrangements: Spouse/significant other Available Help at Discharge: Family;Available PRN/intermittently Type of Home: House Home Access: Stairs to enter Entrance Stairs-Rails: None Entrance Stairs-Number of Steps: 1-2   Home Layout: One level Home Equipment: Conservation officer, nature (2 wheels);Cane - single point;Grab bars - tub/shower      Prior Function Prior Level of Function : Independent/Modified Independent             Mobility Comments: household and short distanced community  ambulator, does not drive ADLs Comments: assisted by family     Hand Dominance        Extremity/Trunk Assessment   Upper Extremity Assessment Upper Extremity Assessment: Generalized weakness;LUE deficits/detail LUE Deficits / Details: grossly -3/5, baseline per patient due to nerve problem since Chemo therapy LUE: Unable to fully assess due to pain LUE Sensation: WNL LUE Coordination: decreased fine motor;decreased gross motor    Lower Extremity Assessment Lower Extremity Assessment: Generalized weakness;LLE deficits/detail LLE Deficits / Details: grossly 2/5 mostly due to pain LLE: Unable to fully assess due to pain LLE Sensation: WNL LLE Coordination: WNL       Communication   Communication: No difficulties  Cognition Arousal/Alertness: Awake/alert Behavior During Therapy: WFL for tasks assessed/performed Overall Cognitive Status: Within Functional Limits for tasks assessed                                          General Comments      Exercises     Assessment/Plan    PT Assessment Patient needs continued PT services  PT Problem List Decreased strength;Decreased activity tolerance;Decreased balance;Decreased mobility       PT Treatment Interventions DME instruction;Stair training;Gait training;Functional mobility training;Therapeutic activities;Therapeutic exercise;Patient/family education;Balance training    PT Goals (Current goals can be found in the Care Plan section)  Acute Rehab PT Goals Patient Stated Goal: return home after rehab PT Goal Formulation: With patient/family Time For Goal Achievement: 05/12/21 Potential to Achieve Goals: Fair    Frequency Min 3X/week   Barriers to discharge        Co-evaluation               AM-PAC PT "6 Clicks" Mobility  Outcome Measure Help needed turning from your back to your side while in a flat bed without using bedrails?: A Lot Help needed moving from lying on your back to sitting  on the side of a flat bed without using bedrails?: Total Help needed moving to and from a bed to a chair (including a wheelchair)?: Total Help needed standing up from a chair using your arms (e.g., wheelchair or bedside chair)?: Total Help needed to walk in hospital room?: Total Help needed climbing 3-5 steps with a railing? : Total 6 Click Score: 7    End of Session   Activity Tolerance: Patient tolerated treatment well;Patient limited by fatigue;Patient limited by pain Patient left: in bed;with call bell/phone within reach Nurse Communication: Mobility status PT Visit Diagnosis: Unsteadiness on feet (R26.81);Other abnormalities of gait and mobility (R26.89);Muscle weakness (generalized) (M62.81)    Time: 2703-5009 PT Time Calculation (min) (ACUTE ONLY): 15 min   Charges:   PT Evaluation $PT Eval Low Complexity: 1 Low PT Treatments $Therapeutic Activity: 8-22 mins        1:53 PM, 04/28/21 Lonell Grandchild, MPT Physical Therapist with [facility]  [place] office [phone] mobile phone

## 2021-04-29 ENCOUNTER — Inpatient Hospital Stay (HOSPITAL_COMMUNITY): Payer: Medicare HMO

## 2021-04-29 DIAGNOSIS — I82412 Acute embolism and thrombosis of left femoral vein: Secondary | ICD-10-CM | POA: Diagnosis not present

## 2021-04-29 DIAGNOSIS — I10 Essential (primary) hypertension: Secondary | ICD-10-CM | POA: Diagnosis not present

## 2021-04-29 DIAGNOSIS — M5416 Radiculopathy, lumbar region: Secondary | ICD-10-CM | POA: Diagnosis not present

## 2021-04-29 DIAGNOSIS — I471 Supraventricular tachycardia: Secondary | ICD-10-CM | POA: Diagnosis not present

## 2021-04-29 DIAGNOSIS — W19XXXD Unspecified fall, subsequent encounter: Secondary | ICD-10-CM | POA: Diagnosis not present

## 2021-04-29 DIAGNOSIS — R000 Tachycardia, unspecified: Secondary | ICD-10-CM | POA: Diagnosis not present

## 2021-04-29 DIAGNOSIS — M1712 Unilateral primary osteoarthritis, left knee: Secondary | ICD-10-CM

## 2021-04-29 DIAGNOSIS — M1612 Unilateral primary osteoarthritis, left hip: Secondary | ICD-10-CM | POA: Diagnosis not present

## 2021-04-29 DIAGNOSIS — N3000 Acute cystitis without hematuria: Secondary | ICD-10-CM

## 2021-04-29 LAB — COMPREHENSIVE METABOLIC PANEL
ALT: 9 U/L (ref 0–44)
AST: 19 U/L (ref 15–41)
Albumin: 2.2 g/dL — ABNORMAL LOW (ref 3.5–5.0)
Alkaline Phosphatase: 82 U/L (ref 38–126)
Anion gap: 9 (ref 5–15)
BUN: 19 mg/dL (ref 8–23)
CO2: 22 mmol/L (ref 22–32)
Calcium: 8.4 mg/dL — ABNORMAL LOW (ref 8.9–10.3)
Chloride: 97 mmol/L — ABNORMAL LOW (ref 98–111)
Creatinine, Ser: 0.57 mg/dL (ref 0.44–1.00)
GFR, Estimated: >60 mL/min (ref >60)
Glucose, Bld: 112 mg/dL — ABNORMAL HIGH (ref 70–99)
Potassium: 3.8 mmol/L (ref 3.5–5.1)
Sodium: 128 mmol/L — ABNORMAL LOW (ref 135–145)
Total Bilirubin: 0.6 mg/dL (ref 0.3–1.2)
Total Protein: 6.6 g/dL (ref 6.5–8.1)

## 2021-04-29 LAB — BLOOD CULTURE ID PANEL (REFLEXED) - BCID2

## 2021-04-29 LAB — CBC WITH DIFFERENTIAL/PLATELET
Abs Immature Granulocytes: 0.1 10*3/uL — ABNORMAL HIGH (ref 0.00–0.07)
Basophils Absolute: 0 10*3/uL (ref 0.0–0.1)
Basophils Relative: 0 %
Eosinophils Absolute: 0 10*3/uL (ref 0.0–0.5)
Eosinophils Relative: 0 %
HCT: 23.7 % — ABNORMAL LOW (ref 36.0–46.0)
Hemoglobin: 7.2 g/dL — ABNORMAL LOW (ref 12.0–15.0)
Immature Granulocytes: 1 %
Lymphocytes Relative: 8 %
Lymphs Abs: 1.4 10*3/uL (ref 0.7–4.0)
MCH: 27.9 pg (ref 26.0–34.0)
MCHC: 30.4 g/dL (ref 30.0–36.0)
MCV: 91.9 fL (ref 80.0–100.0)
Monocytes Absolute: 1.3 10*3/uL — ABNORMAL HIGH (ref 0.1–1.0)
Monocytes Relative: 8 %
Neutro Abs: 13.8 10*3/uL — ABNORMAL HIGH (ref 1.7–7.7)
Neutrophils Relative %: 83 %
Platelets: 54 10*3/uL — ABNORMAL LOW (ref 150–400)
RBC: 2.58 MIL/uL — ABNORMAL LOW (ref 3.87–5.11)
RDW: 20.4 % — ABNORMAL HIGH (ref 11.5–15.5)
WBC: 16.6 10*3/uL — ABNORMAL HIGH (ref 4.0–10.5)
nRBC: 0 % (ref 0.0–0.2)

## 2021-04-29 LAB — PROCALCITONIN: Procalcitonin: 0.77 ng/mL

## 2021-04-29 LAB — GLUCOSE, CAPILLARY
Glucose-Capillary: 107 mg/dL — ABNORMAL HIGH (ref 70–99)
Glucose-Capillary: 133 mg/dL — ABNORMAL HIGH (ref 70–99)
Glucose-Capillary: 161 mg/dL — ABNORMAL HIGH (ref 70–99)
Glucose-Capillary: 272 mg/dL — ABNORMAL HIGH (ref 70–99)

## 2021-04-29 MED ORDER — VANCOMYCIN HCL 1500 MG/300ML IV SOLN: 1500.0000 mg | INTRAVENOUS | Status: DC

## 2021-04-29 MED ORDER — GABAPENTIN 300 MG PO CAPS
300.0000 mg | ORAL_CAPSULE | Freq: Three times a day (TID) | ORAL | Status: DC
  Administered 2021-04-29 – 2021-05-06 (×22): 300 mg via ORAL
  Filled 2021-04-29 (×22): qty 1

## 2021-04-29 MED ORDER — SODIUM CHLORIDE 0.9 % IV SOLN
2.0000 g | INTRAVENOUS | Status: AC
  Administered 2021-04-29 – 2021-05-03 (×5): 2 g via INTRAVENOUS
  Filled 2021-04-29 (×5): qty 20

## 2021-04-29 MED ORDER — GABAPENTIN 600 MG PO TABS
300.0000 mg | ORAL_TABLET | Freq: Three times a day (TID) | ORAL | Status: DC
  Filled 2021-04-29 (×7): qty 0.5

## 2021-04-29 MED ORDER — VANCOMYCIN HCL 2000 MG/400ML IV SOLN
2000.0000 mg | Freq: Once | INTRAVENOUS | Status: DC
  Administered 2021-04-29: 2000 mg via INTRAVENOUS
  Filled 2021-04-29 (×2): qty 400

## 2021-04-29 MED ORDER — METHYLPREDNISOLONE SODIUM SUCC 40 MG IJ SOLR
40.0000 mg | Freq: Three times a day (TID) | INTRAMUSCULAR | Status: AC
  Administered 2021-04-29 (×3): 40 mg via INTRAVENOUS
  Filled 2021-04-29 (×3): qty 1

## 2021-04-29 NOTE — Consult Note (Signed)
Reason for Consult:left knee pain , ? Injection  Referring Physician: Dr Irwin Brakeman  Crystal Vega is an 70 y.o. female.  HPI: 70 year old female medical problems as listed below including cancer presented to the hospital with pain in her left leg hip knee thigh and declining ability to ambulate or stand on her left leg  She tells me that her left leg hurts.  This includes pain in the groin lower back left buttock area left knee left leg and left foot.  The patient did fall about 2 to 3 weeks ago but had pain in her left hip and leg and knee prior to this  Pain has been severe and has not really responded to any measures up to this point  Past Medical History:  Diagnosis Date   Anemia    Arthritis    per patient " left knee"   DVT (deep venous thrombosis) (HCC)    left leg   Dyspnea    Essential hypertension, benign    Family history of cancer of female genital organ    Family history of GI tract cancer    Metastatic breast cancer ([place])    left breast   Port-A-Cath in place 12/25/2019   Type 2 diabetes mellitus [facility])     Past Surgical History:  Procedure Laterality Date   BIOPSY  11/27/2020   Procedure: BIOPSY;  Surgeon: Daneil Dolin, MD;  [facility];  Service: Endoscopy;;   CATARACT EXTRACTION W/ INTRAOCULAR LENS IMPLANT Right    COLONOSCOPY WITH PROPOFOL N/A 11/27/2020   Procedure: COLONOSCOPY WITH PROPOFOL;  Surgeon: Daneil Dolin, MD;  [facility];  Service: Endoscopy;  Laterality: N/A;  9:00am   HYSTEROSCOPY WITH D & C N/A 06/05/2014   Procedure: DILATATION AND CURETTAGE /HYSTEROSCOPY;  Surgeon: Florian Buff, MD;  Location: [facility];  Service: Gynecology;  Laterality: N/A;   POLYPECTOMY N/A 06/05/2014   Procedure: ENDOMETRIAL POLYPECTOMY;  Surgeon: Florian Buff, MD;  Location: [facility];  Service: Gynecology;  Laterality: N/A;   PORTACATH PLACEMENT N/A 12/14/2019   Procedure: INSERTION PORT-A-CATH WITH ULTRASOUND GUIDANCE;  Surgeon: Donnie Mesa, MD;  Location: [facility] [place];  Service: General;  Laterality: N/A;   TUBAL LIGATION      Family History  Problem Relation Age of Onset   Diabetes Mellitus II Father    Congestive Heart Failure Father    Colon cancer Mother 42       patient not sure if colon vs stomach   Stroke Maternal Grandmother    Cancer Cousin        female reproductive cancer, dx. 50s/60s    Social History:  reports that she has quit smoking. Her smoking use included cigarettes. She has never used smokeless tobacco. She reports that she does not drink alcohol and does not use drugs.  Allergies: No Known Allergies  Medications: I have reviewed the patient's current medications.  Results for orders placed or performed during the hospital encounter of 04/27/21 (from the past 48 hour(s))  Comprehensive metabolic panel     Status: Abnormal   Collection Time: 04/27/21 11:52 AM  Result Value Ref Range   Sodium 129 (L) 135 - 145 mmol/L   Potassium 3.8 3.5 - 5.1 mmol/L   Chloride 94 (L) 98 - 111 mmol/L   CO2 25 22 - 32 mmol/L   Glucose, Bld 147 (H) 70 - 99 mg/dL    Comment: Glucose reference range applies only to samples taken after fasting  for at least 8 hours.   BUN 16 8 - 23 mg/dL   Creatinine, Ser 0.55 0.44 - 1.00 mg/dL   Calcium 8.6 (L) 8.9 - 10.3 mg/dL   Total Protein 7.2 6.5 - 8.1 g/dL   Albumin 2.3 (L) 3.5 - 5.0 g/dL   AST 26 15 - 41 U/L   ALT 12 0 - 44 U/L   Alkaline Phosphatase 103 38 - 126 U/L   Total Bilirubin 0.6 0.3 - 1.2 mg/dL   GFR, Estimated >60 >60 mL/min    Comment: (NOTE) Calculated using the CKD-EPI Creatinine Equation (2021)    Anion gap 10 5 - 15    Comment: Performed at [facility], [address] [place], [place] [address]  CBC with Differential     Status: Abnormal   Collection Time: 04/27/21 11:52 AM  Result Value Ref Range   WBC 9.1 4.0 - 10.5 K/uL   RBC 2.56 (L) 3.87 - 5.11 MIL/uL   Hemoglobin 7.3 (L) 12.0 - 15.0 g/dL   HCT 23.2 (L) 36.0 - 46.0 %   MCV 90.6 80.0 - 100.0  fL   MCH 28.5 26.0 - 34.0 pg   MCHC 31.5 30.0 - 36.0 g/dL   RDW 21.9 (H) 11.5 - 15.5 %   Platelets 56 (L) 150 - 400 K/uL    Comment: SPECIMEN CHECKED FOR CLOTS Immature Platelet Fraction may be clinically indicated, consider ordering this additional test [id number] PLATELET COUNT CONFIRMED BY SMEAR    nRBC 0.0 0.0 - 0.2 %   Neutrophils Relative % 78 %   Neutro Abs 7.0 1.7 - 7.7 K/uL   Lymphocytes Relative 11 %   Lymphs Abs 1.0 0.7 - 4.0 K/uL   Monocytes Relative 10 %   Monocytes Absolute 0.9 0.1 - 1.0 K/uL   Eosinophils Relative 0 %   Eosinophils Absolute 0.0 0.0 - 0.5 K/uL   Basophils Relative 0 %   Basophils Absolute 0.0 0.0 - 0.1 K/uL   Immature Granulocytes 1 %   Abs Immature Granulocytes 0.06 0.00 - 0.07 K/uL   Polychromasia PRESENT    Spherocytes PRESENT    Stomatocytes PRESENT     Comment: Performed at [facility], [address], [place], [place] [address]  Urinalysis, Routine w reflex microscopic Urine, Clean Catch     Status: Abnormal   Collection Time: 04/27/21  6:07 PM  Result Value Ref Range   Color, Urine YELLOW YELLOW   APPearance CLOUDY (A) CLEAR   Specific Gravity, Urine 1.025 1.005 - 1.030   pH 6.5 5.0 - 8.0   Glucose, UA NEGATIVE NEGATIVE mg/dL   Hgb urine dipstick LARGE (A) NEGATIVE   Bilirubin Urine NEGATIVE NEGATIVE   Ketones, ur NEGATIVE NEGATIVE mg/dL   Protein, ur 30 (A) NEGATIVE mg/dL   Nitrite NEGATIVE NEGATIVE   Leukocytes,Ua MODERATE (A) NEGATIVE    Comment: Performed at [facility], [address], [place],  [address]  Resp Panel by RT-PCR (Flu A&B, Covid) Nasopharyngeal Swab     Status: None   Collection Time: 04/27/21  6:07 PM   Specimen: Nasopharyngeal Swab; Nasopharyngeal(NP) swabs in vial transport medium  Result Value Ref Range   SARS Coronavirus 2 by RT PCR NEGATIVE NEGATIVE    Comment: (NOTE) SARS-CoV-2 target nucleic acids are NOT DETECTED.  The SARS-CoV-2 RNA is generally detectable in upper respiratory specimens during  the acute phase of infection. The lowest concentration of SARS-CoV-2 viral copies this assay can detect is 138 copies/mL. A negative result does not  preclude SARS-Cov-2 infection and should not be used as the sole basis for treatment or other patient management decisions. A negative result may occur with  improper specimen collection/handling, submission of specimen other than nasopharyngeal swab, presence of viral mutation(s) within the areas targeted by this assay, and inadequate number of viral copies(<138 copies/mL). A negative result must be combined with clinical observations, patient history, and epidemiological information. The expected result is Negative.  Fact Sheet for Patients:  EntrepreneurPulse.com.au  Fact Sheet for Healthcare Providers:  IncredibleEmployment.be  This test is no t yet approved or cleared by the [place] FDA and  has been authorized for detection and/or diagnosis of SARS-CoV-2 by FDA under an Emergency Use Authorization (EUA). This EUA will remain  in effect (meaning this test can be used) for the duration of the COVID-19 declaration under Section 564(b)(1) of the Act, 21 U.S.C.section 360bbb-3(b)(1), unless the authorization is terminated  or revoked sooner.       Influenza A by PCR NEGATIVE NEGATIVE   Influenza B by PCR NEGATIVE NEGATIVE    Comment: (NOTE) The Xpert Xpress SARS-CoV-2/FLU/RSV plus assay is intended as an aid in the diagnosis of influenza from Nasopharyngeal swab specimens and should not be used as a sole basis for treatment. Nasal washings and aspirates are unacceptable for Xpert Xpress SARS-CoV-2/FLU/RSV testing.  Fact Sheet for Patients: EntrepreneurPulse.com.au  Fact Sheet for Healthcare Providers: IncredibleEmployment.be  This test is not yet approved or cleared by the [place] FDA and has been authorized for detection and/or diagnosis of  SARS-CoV-2 by FDA under an Emergency Use Authorization (EUA). This EUA will remain in effect (meaning this test can be used) for the duration of the COVID-19 declaration under Section 564(b)(1) of the Act, 21 U.S.C. section 360bbb-3(b)(1), unless the authorization is terminated or revoked.  Performed at [facility], [address] [place], [place] [address]   Urinalysis, Microscopic (reflex)     Status: Abnormal   Collection Time: 04/27/21  6:07 PM  Result Value Ref Range   RBC / HPF 0-5 0 - 5 RBC/hpf   WBC, UA >50 0 - 5 WBC/hpf   Bacteria, UA MANY (A) NONE SEEN   Squamous Epithelial / LPF 11-20 0 - 5   WBC Clumps PRESENT     Comment: Performed at [facility], [address] [place], [place] [address]  MRSA Next Gen by PCR, Nasal     Status: None   Collection Time: 04/27/21  9:31 PM   Specimen: Nasal Mucosa; Nasal Swab  Result Value Ref Range   MRSA by PCR Next Gen NOT DETECTED NOT DETECTED    Comment: (NOTE) The GeneXpert MRSA Assay (FDA approved for NASAL specimens only), is one component of a comprehensive MRSA colonization surveillance program. It is not intended to diagnose MRSA infection nor to guide or monitor treatment for MRSA infections. Test performance is not FDA approved in patients less than 73 years old. Performed at [facility], [address] [place], [place] [address]   Type and screen [facility]     Status: None (Preliminary result)   Collection Time: 04/27/21 10:01 PM  Result Value Ref Range   ABO/RH(D) O POS    Antibody Screen NEG    Sample Expiration      04/30/2021,2359 Performed at [facility], [address], [place],  [address]    Unit Number [id number]    Blood Component Type RED CELLS,LR    Unit division 00    Status of Unit  ISSUED    Transfusion Status OK TO TRANSFUSE    Crossmatch Result Compatible    Unit Number [id number]    Blood Component Type RED CELLS,LR    Unit division 00    Status of Unit ALLOCATED     Transfusion Status OK TO TRANSFUSE    Crossmatch Result Compatible   Prepare RBC (crossmatch)     Status: None   Collection Time: 04/27/21 10:01 PM  Result Value Ref Range   Order Confirmation      ORDER PROCESSED BY BLOOD BANK Performed at [facility], [address] [place], [place] [address]   HIV Antibody (routine testing w rflx)     Status: None   Collection Time: 04/27/21 10:01 PM  Result Value Ref Range   HIV Screen 4th Generation wRfx Non Reactive Non Reactive    Comment: Performed at [facility], [place] [address], [place], [place] [address]  Glucose, capillary     Status: Abnormal   Collection Time: 04/27/21 10:32 PM  Result Value Ref Range   Glucose-Capillary 143 (H) 70 - 99 mg/dL    Comment: Glucose reference range applies only to samples taken after fasting for at least 8 hours.   Comment 1 Notify RN    Comment 2 Document in Chart   Hemoglobin A1c     Status: None   Collection Time: 04/28/21  4:32 AM  Result Value Ref Range   Hgb A1c MFr Bld 5.5 4.8 - 5.6 %    Comment: (NOTE)         Prediabetes: 5.7 - 6.4         Diabetes: >6.4         Glycemic control for adults with diabetes: <7.0    Mean Plasma Glucose 111 mg/dL    Comment: (NOTE) Performed At: [facility] [place], [place] [id number] Rush Farmer MD [id number]   Basic metabolic panel     Status: Abnormal   Collection Time: 04/28/21  4:32 AM  Result Value Ref Range   Sodium 127 (L) 135 - 145 mmol/L   Potassium 3.6 3.5 - 5.1 mmol/L   Chloride 95 (L) 98 - 111 mmol/L   CO2 23 22 - 32 mmol/L   Glucose, Bld 148 (H) 70 - 99 mg/dL    Comment: Glucose reference range applies only to samples taken after fasting for at least 8 hours.   BUN 15 8 - 23 mg/dL   Creatinine, Ser 0.55 0.44 - 1.00 mg/dL   Calcium 8.3 (L) 8.9 - 10.3 mg/dL   GFR, Estimated >60 >60 mL/min    Comment: (NOTE) Calculated using the CKD-EPI Creatinine Equation (2021)    Anion gap 9 5 - 15    Comment:  Performed at [facility], [address] [place], [place] [address]  CBC     Status: Abnormal   Collection Time: 04/28/21  4:32 AM  Result Value Ref Range   WBC 14.8 (H) 4.0 - 10.5 K/uL   RBC 2.86 (L) 3.87 - 5.11 MIL/uL   Hemoglobin 8.1 (L) 12.0 - 15.0 g/dL   HCT 25.7 (L) 36.0 - 46.0 %   MCV 89.9 80.0 - 100.0 fL   MCH 28.3 26.0 - 34.0 pg   MCHC 31.5 30.0 - 36.0 g/dL   RDW 20.5 (H) 11.5 - 15.5 %   Platelets 52 (L) 150 - 400 K/uL    Comment: SPECIMEN CHECKED FOR CLOTS Immature Platelet Fraction may be clinically indicated, consider ordering this  additional test [id number] CONSISTENT WITH PREVIOUS RESULT    nRBC 0.0 0.0 - 0.2 %    Comment: Performed at [facility], [address] [place], [place] [address]  TSH     Status: None   Collection Time: 04/28/21  4:32 AM  Result Value Ref Range   TSH 2.009 0.350 - 4.500 uIU/mL    Comment: Performed by a 3rd Generation assay with a functional sensitivity of <=0.01 uIU/mL. Performed at [facility], [address], [place], [place] [address]   Troponin I (High Sensitivity)     Status: None   Collection Time: 04/28/21  4:32 AM  Result Value Ref Range   Troponin I (High Sensitivity) 12 <18 ng/L    Comment: (NOTE) Elevated high sensitivity troponin I (hsTnI) values and significant  changes across serial measurements may suggest ACS but many other  chronic and acute conditions are known to elevate hsTnI results.  Refer to the "Links" section for chest pain algorithms and additional  guidance. Performed at [facility], [address] [place], [place] [address]   Troponin I (High Sensitivity)     Status: None   Collection Time: 04/28/21  6:46 AM  Result Value Ref Range   Troponin I (High Sensitivity) 10 <18 ng/L    Comment: (NOTE) Elevated high sensitivity troponin I (hsTnI) values and significant  changes across serial measurements may suggest ACS but many other  chronic and acute conditions are known to elevate hsTnI results.  Refer  to the "Links" section for chest pain algorithms and additional  guidance. Performed at [facility], [address], [place], [place] [address]   Procalcitonin - Baseline     Status: None   Collection Time: 04/28/21  6:46 AM  Result Value Ref Range   Procalcitonin 0.51 ng/mL    Comment:        Interpretation: PCT > 0.5 ng/mL and <= 2 ng/mL: Systemic infection (sepsis) is possible, but other conditions are known to elevate PCT as well. (NOTE)       Sepsis PCT Algorithm           Lower Respiratory Tract                                      Infection PCT Algorithm    ----------------------------     ----------------------------         PCT < 0.25 ng/mL                PCT < 0.10 ng/mL          Strongly encourage             Strongly discourage   discontinuation of antibiotics    initiation of antibiotics    ----------------------------     -----------------------------       PCT 0.25 - 0.50 ng/mL            PCT 0.10 - 0.25 ng/mL               OR       >80% decrease in PCT            Discourage initiation of  antibiotics      Encourage discontinuation           of antibiotics    ----------------------------     -----------------------------         PCT >= 0.50 ng/mL              PCT 0.26 - 0.50 ng/mL                AND       <80% decrease in PCT             Encourage initiation of                                             antibiotics       Encourage continuation           of antibiotics    ----------------------------     -----------------------------        PCT >= 0.50 ng/mL                  PCT > 0.50 ng/mL               AND         increase in PCT                  Strongly encourage                                      initiation of antibiotics    Strongly encourage escalation           of antibiotics                                     -----------------------------                                           PCT <= 0.25 ng/mL                                                  OR                                        > 80% decrease in PCT                                      Discontinue / Do not initiate                                             antibiotics  Performed at [facility], [address] [place], [place] [address]   Glucose, capillary     Status: Abnormal   Collection Time: 04/28/21  7:22 AM  Result Value Ref Range   Glucose-Capillary 132 (H) 70 - 99 mg/dL    Comment: Glucose reference range applies only to samples taken after fasting for at least 8 hours.  Culture, blood (routine x 2)     Status: None (Preliminary result)   Collection Time: 04/28/21  7:49 AM   Specimen: BLOOD  Result Value Ref Range   Specimen Description      BLOOD RIGHT PORT Performed at [facility], [address], [place], [place] [address]    Special Requests      BOTTLES DRAWN AEROBIC AND ANAEROBIC Blood Culture adequate volume Performed at [facility], [address], [place], [place] [address]    Culture  Setup Time      GRAM POSITIVE COCCI anaerobic bottle Gram Stain Report Called to,Read Back By and Verified With: Caraballo,[email or web address]  by [email or web address], b 11.29.22 AEROBIC BOTTLE  CRITICAL RESULT CALLED TO, READ BACK BY AND VERIFIED WITH: S ALSTON,[email or web address]  04/29/21 [place] Performed at [facility], [address], [place], [place] [address]    Culture GRAM POSITIVE COCCI    Report Status PENDING   Blood Culture ID Panel (Reflexed)     Status: Abnormal   Collection Time: 04/28/21  7:49 AM  Result Value Ref Range   Enterococcus faecalis NOT DETECTED NOT DETECTED   Enterococcus Faecium NOT DETECTED NOT DETECTED   Listeria monocytogenes NOT DETECTED NOT DETECTED   Staphylococcus species NOT DETECTED NOT DETECTED   Staphylococcus aureus (BCID) NOT DETECTED NOT DETECTED   Staphylococcus epidermidis NOT DETECTED NOT DETECTED   Staphylococcus lugdunensis NOT DETECTED NOT DETECTED   Streptococcus species DETECTED (A) NOT DETECTED     Comment: Not Enterococcus species, Streptococcus agalactiae, Streptococcus pyogenes, or Streptococcus pneumoniae. CRITICAL RESULT CALLED TO, READ BACK BY AND VERIFIED WITH: S ALSTON,[email or web address]@0325  04/29/21 [place]    Streptococcus agalactiae NOT DETECTED NOT DETECTED   Streptococcus pneumoniae NOT DETECTED NOT DETECTED   Streptococcus pyogenes NOT DETECTED NOT DETECTED   A.calcoaceticus-baumannii NOT DETECTED NOT DETECTED   Bacteroides fragilis NOT DETECTED NOT DETECTED   Enterobacterales NOT DETECTED NOT DETECTED   Enterobacter cloacae complex NOT DETECTED NOT DETECTED   Escherichia coli NOT DETECTED NOT DETECTED   Klebsiella aerogenes NOT DETECTED NOT DETECTED   Klebsiella oxytoca NOT DETECTED NOT DETECTED   Klebsiella pneumoniae NOT DETECTED NOT DETECTED   Proteus species NOT DETECTED NOT DETECTED   Salmonella species NOT DETECTED NOT DETECTED   Serratia marcescens NOT DETECTED NOT DETECTED   Haemophilus influenzae NOT DETECTED NOT DETECTED   Neisseria meningitidis NOT DETECTED NOT DETECTED   Pseudomonas aeruginosa NOT DETECTED NOT DETECTED   Stenotrophomonas maltophilia NOT DETECTED NOT DETECTED   Candida albicans NOT DETECTED NOT DETECTED   Candida auris NOT DETECTED NOT DETECTED   Candida glabrata NOT DETECTED NOT DETECTED   Candida krusei NOT DETECTED NOT DETECTED   Candida parapsilosis NOT DETECTED NOT DETECTED   Candida tropicalis NOT DETECTED NOT DETECTED   Cryptococcus neoformans/gattii NOT DETECTED NOT DETECTED    Comment: Performed at [facility], [place] [address] [place], [place] [address]  Culture, blood (routine x 2)     Status: None (Preliminary result)   Collection Time: 04/28/21  8:34 AM   Specimen: Right Antecubital; Blood  Result Value Ref Range   Specimen Description RIGHT ANTECUBITAL    Special Requests      BOTTLES DRAWN AEROBIC AND ANAEROBIC Blood Culture adequate volume   Culture  Setup Time      GRAM POSITIVE  COCCI anaerobic AND AEROBIC BOTTLEbottle Gram  Stain Report Called to,Read Back By and Verified With: Caraballo,[email or web address]  by [email or web address],b 11.29.22   Culture      NO GROWTH < 24 HOURS Performed at [facility], [address] [place], [place] [address]    Report Status PENDING   Glucose, capillary     Status: Abnormal   Collection Time: 04/28/21 11:44 AM  Result Value Ref Range   Glucose-Capillary 155 (H) 70 - 99 mg/dL    Comment: Glucose reference range applies only to samples taken after fasting for at least 8 hours.  Glucose, capillary     Status: Abnormal   Collection Time: 04/28/21  4:27 PM  Result Value Ref Range   Glucose-Capillary 145 (H) 70 - 99 mg/dL    Comment: Glucose reference range applies only to samples taken after fasting for at least 8 hours.  Glucose, capillary     Status: Abnormal   Collection Time: 04/28/21  8:43 PM  Result Value Ref Range   Glucose-Capillary 118 (H) 70 - 99 mg/dL    Comment: Glucose reference range applies only to samples taken after fasting for at least 8 hours.  Procalcitonin     Status: None   Collection Time: 04/29/21  4:45 AM  Result Value Ref Range   Procalcitonin 0.77 ng/mL    Comment:        Interpretation: PCT > 0.5 ng/mL and <= 2 ng/mL: Systemic infection (sepsis) is possible, but other conditions are known to elevate PCT as well. (NOTE)       Sepsis PCT Algorithm           Lower Respiratory Tract                                      Infection PCT Algorithm    ----------------------------     ----------------------------         PCT < 0.25 ng/mL                PCT < 0.10 ng/mL          Strongly encourage             Strongly discourage   discontinuation of antibiotics    initiation of antibiotics    ----------------------------     -----------------------------       PCT 0.25 - 0.50 ng/mL            PCT 0.10 - 0.25 ng/mL               OR       >80% decrease in PCT            Discourage initiation of                                            antibiotics      Encourage  discontinuation           of antibiotics    ----------------------------     -----------------------------         PCT >= 0.50 ng/mL              PCT 0.26 - 0.50 ng/mL                AND       <  80% decrease in PCT             Encourage initiation of                                             antibiotics       Encourage continuation           of antibiotics    ----------------------------     -----------------------------        PCT >= 0.50 ng/mL                  PCT > 0.50 ng/mL               AND         increase in PCT                  Strongly encourage                                      initiation of antibiotics    Strongly encourage escalation           of antibiotics                                     -----------------------------                                           PCT <= 0.25 ng/mL                                                 OR                                        > 80% decrease in PCT                                      Discontinue / Do not initiate                                             antibiotics  Performed at [facility], [address] [place], [place] [address]   CBC with Differential/Platelet     Status: Abnormal   Collection Time: 04/29/21  4:45 AM  Result Value Ref Range   WBC 16.6 (H) 4.0 - 10.5 K/uL   RBC 2.58 (L) 3.87 - 5.11 MIL/uL   Hemoglobin 7.2 (L) 12.0 - 15.0 g/dL   HCT 23.7 (L) 36.0 - 46.0 %   MCV 91.9 80.0 - 100.0 fL   MCH 27.9 26.0 - 34.0 pg   MCHC 30.4 30.0 - 36.0 g/dL   RDW 20.4 (H) 11.5 - 15.5 %   Platelets 54 (  L) 150 - 400 K/uL    Comment: SPECIMEN CHECKED FOR CLOTS Immature Platelet Fraction may be clinically indicated, consider ordering this additional test [id number] CONSISTENT WITH PREVIOUS RESULT    nRBC 0.0 0.0 - 0.2 %   Neutrophils Relative % 83 %   Neutro Abs 13.8 (H) 1.7 - 7.7 K/uL   Lymphocytes Relative 8 %   Lymphs Abs 1.4 0.7 - 4.0 K/uL   Monocytes Relative 8 %   Monocytes Absolute 1.3 (H) 0.1 - 1.0  K/uL   Eosinophils Relative 0 %   Eosinophils Absolute 0.0 0.0 - 0.5 K/uL   Basophils Relative 0 %   Basophils Absolute 0.0 0.0 - 0.1 K/uL   Immature Granulocytes 1 %   Abs Immature Granulocytes 0.10 (H) 0.00 - 0.07 K/uL    Comment: Performed at [facility], [address] [place], [place] [address]  Comprehensive metabolic panel     Status: Abnormal   Collection Time: 04/29/21  4:45 AM  Result Value Ref Range   Sodium 128 (L) 135 - 145 mmol/L   Potassium 3.8 3.5 - 5.1 mmol/L   Chloride 97 (L) 98 - 111 mmol/L   CO2 22 22 - 32 mmol/L   Glucose, Bld 112 (H) 70 - 99 mg/dL    Comment: Glucose reference range applies only to samples taken after fasting for at least 8 hours.   BUN 19 8 - 23 mg/dL   Creatinine, Ser 0.57 0.44 - 1.00 mg/dL   Calcium 8.4 (L) 8.9 - 10.3 mg/dL   Total Protein 6.6 6.5 - 8.1 g/dL   Albumin 2.2 (L) 3.5 - 5.0 g/dL   AST 19 15 - 41 U/L   ALT 9 0 - 44 U/L   Alkaline Phosphatase 82 38 - 126 U/L   Total Bilirubin 0.6 0.3 - 1.2 mg/dL   GFR, Estimated >60 >60 mL/min    Comment: (NOTE) Calculated using the CKD-EPI Creatinine Equation (2021)    Anion gap 9 5 - 15    Comment: Performed at [facility], [address] [place],  [address]  Glucose, capillary     Status: Abnormal   Collection Time: 04/29/21  7:29 AM  Result Value Ref Range   Glucose-Capillary 107 (H) 70 - 99 mg/dL    Comment: Glucose reference range applies only to samples taken after fasting for at least 8 hours.    DG Chest 1 View  Result Date: 04/27/2021 CLINICAL DATA:  [place] 2 weeks ago EXAM: CHEST  1 VIEW COMPARISON:  07/28/2020 FINDINGS: Power port on the right with the tip in the right atrium, possibly approaching the tricuspid valve. No evidence of heart failure. Patient has taken a poor inspiration. No traumatic regional finding. IMPRESSION: Poor inspiration. No traumatic finding. Power port tip in the right atrium, possibly near the tricuspid valve. Electronically Signed   By: Nelson Chimes M.D.   On: 04/27/2021 13:18   DG Knee 1-2 Views Left  Result Date: 04/27/2021 CLINICAL DATA:  [place] 2 weeks ago.  Pain and limited range of motion. EXAM: LEFT KNEE - 1-2 VIEW COMPARISON:  None. FINDINGS: There is tricompartmental osteoarthritis with a joint effusion. No evidence of fracture or focal bone lesion. IMPRESSION: Osteoarthritis and joint effusion. No traumatic bone finding visible. Electronically Signed   By: Nelson Chimes M.D.   On: 04/27/2021 13:16   CT Angio Chest PE W and/or Wo Contrast  Result Date: 04/27/2021 CLINICAL DATA:  PE suspected, high probability. LEFT leg pain and weakness  status post fall 2 weeks ago. History of metastatic LEFT breast cancer, per clinical data provided on previous CT report. EXAM: CT ANGIOGRAPHY CHEST CT ABDOMEN AND PELVIS WITH CONTRAST TECHNIQUE: Multidetector CT imaging of the chest was performed using the standard protocol during bolus administration of intravenous contrast. Multiplanar CT image reconstructions and MIPs were obtained to evaluate the vascular anatomy. Multidetector CT imaging of the abdomen and pelvis was performed using the standard protocol during bolus administration of intravenous contrast. CONTRAST:  19mL OMNIPAQUE IOHEXOL 350 MG/ML SOLN COMPARISON:  CT chest abdomen and pelvis dated 04/09/2021. FINDINGS: CTA CHEST FINDINGS Cardiovascular: Some of the most peripheral subsegmental pulmonary arteries are difficult to definitively characterize due to patient breathing motion artifact, however, there is no pulmonary embolism seen within the main, lobar or central segmental pulmonary arteries bilaterally. No thoracic aortic aneurysm or evidence of aortic dissection. No pericardial effusion. Mediastinum/Nodes: Mediastinal and perihilar lymphadenopathy, including a 1.2 cm short axis lymph node within the anterior mediastinum and a 1.6 cm short axis lymph node within the RIGHT lower paratracheal mediastinum, not significantly changed  compared to the recent chest CT of 04/09/2021, compatible with metastatic lymphadenopathy. Additional conglomerate lymphadenopathy is also again seen within the LEFT supraclavicular region, incompletely imaged. z Esophagus is unremarkable. Trachea and central bronchi are unremarkable. Lungs/Pleura: Lungs are clear.  No pleural effusion or pneumothorax. Musculoskeletal: No acute appearing osseous abnormality. Lytic-appearing lesion within the T6 vertebral body is suspicious for osseous metastasis. Additional destructive/lytic changes within the sternum, also suggesting metastatic disease. Conglomerate lymphadenopathy within the LEFT axilla, and mass/lymphadenopathy in the axillary tail region of the LEFT breast measures 4 cm greatest dimension, all of which is not significantly changed compared to the recent chest CT of 04/09/2021. Additional milder lymphadenopathy within the RIGHT axilla is redemonstrated and stable. Review of the MIP images confirms the above findings. CT ABDOMEN and PELVIS FINDINGS Hepatobiliary: Numerous small hypodense lesions within the bilateral liver lobes, as also described on the earlier CT abdomen report of 12/12/2019, presumably numerous small liver metastases. Single stone within the otherwise normal-appearing gallbladder. No bile duct dilatation is seen. Pancreas: Unremarkable. No pancreatic ductal dilatation or surrounding inflammatory changes. Spleen: Normal in size without focal abnormality. Adrenals/Urinary Tract: Adrenals are unremarkable. Bilateral renal cysts. Kidneys are otherwise unremarkable without suspicious mass, stone or hydronephrosis. No ureteral or bladder calculi are identified. Bladder is unremarkable. Stomach/Bowel: No dilated large or small bowel loops. Scattered diverticulosis of the descending and sigmoid colon but no focal inflammatory change to suggest acute diverticulitis. No evidence of acute bowel wall inflammation. Stomach is unremarkable. Appendix is  normal. Vascular/Lymphatic: Vascular structures of the abdomen and pelvis are unremarkable. Conglomerate lymphadenopathy above the pancreatic head and adjacent to the porta hepatis, as previously described, presumed lymph node metastases. Reproductive: Calcified uterine fibroids. No adnexal mass or free fluid. Other: No free fluid or abscess collection. No free intraperitoneal air. Musculoskeletal: No acute findings. Degenerative spondylosis of the lumbar spine, mild to moderate in degree. 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 Review of the MIP images confirms the above findings. IMPRESSION: 1. No acute findings within the chest. No pulmonary embolism is seen, with mild study limitations detailed above. No evidence of pneumonia or pulmonary edema. 2. Metastatic lymphadenopathy is redemonstrated within the mediastinum, bilateral axillae, and supraclavicular LEFT neck. Also redemonstrated is the mass versus conglomerate lymphadenopathy in the axillary tail region of the LEFT breast. These findings are stable compared to the recent chest CT of 04/09/2021. 3. Lytic-appearing lesion within the  T6 vertebral body, and destructive/lytic changes within the sternum, suspicious for metastatic osseous disease. Consider nuclear medicine bone scan or PET scan for confirmation. 4. Numerous small hypodense lesions within the bilateral liver lobes, as also described on the earlier CT abdomen report of 12/12/2019, presumably liver metastases. 5. Cholelithiasis without evidence of acute cholecystitis. 6. Colonic diverticulosis without evidence of acute diverticulitis. 7. No acute findings within the abdomen or pelvis. No bowel obstruction or evidence of acute bowel wall inflammation. No free fluid or abscess collection. No evidence of acute solid organ abnormality. 8. No evidence of acute osseous fracture or dislocation is seen. Electronically Signed   By: Franki Cabot M.D.   On: 04/27/2021 17:09   CT Knee Left Wo Contrast  Result Date:  04/27/2021 CLINICAL DATA:  Fall 2 weeks ago comment limited range of motion EXAM: CT OF THE LEFT KNEE WITHOUT CONTRAST TECHNIQUE: Multidetector CT imaging of the left knee was performed according to the standard protocol. Multiplanar CT image reconstructions were also generated. COMPARISON:  Radiographs 04/27/2021 FINDINGS: Bones/Joint/Cartilage The knee was imaged in a moderately flexed orientation. There is marked proliferative spurring all 3 compartments with associated cortical irregularity which can reduce sensitivity for subtle fractures. I do not see a well-defined cortical discontinuity to indicate fracture. There is a moderate knee effusion in the suprapatellar bursa. Ligaments Suboptimally assessed by CT. Muscles and Tendons Mild regional muscular atrophy diffusely. Soft tissues Atherosclerosis. Subcutaneous edema anterior to the patella, cannot exclude prepatellar bruising. IMPRESSION: 1. 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 No fracture is identified, although sensitivity for subtle fractures is reduced due to the degree of spurring and associated cortical irregularities. 2. Moderate knee effusion. 3. Atherosclerosis. 4. Subcutaneous edema anterior to the patella. Electronically Signed   By: Van Clines M.D.   On: 04/27/2021 17:15   CT ABDOMEN PELVIS W CONTRAST  Result Date: 04/27/2021 CLINICAL DATA:  PE suspected, high probability. LEFT leg pain and weakness status post fall 2 weeks ago. History of metastatic LEFT breast cancer, per clinical data provided on previous CT report. EXAM: CT ANGIOGRAPHY CHEST CT ABDOMEN AND PELVIS WITH CONTRAST TECHNIQUE: Multidetector CT imaging of the chest was performed using the standard protocol during bolus administration of intravenous contrast. Multiplanar CT image reconstructions and MIPs were obtained to evaluate the vascular anatomy. Multidetector CT imaging of the abdomen and pelvis was performed using the standard protocol  during bolus administration of intravenous contrast. CONTRAST:  174mL OMNIPAQUE IOHEXOL 350 MG/ML SOLN COMPARISON:  CT chest abdomen and pelvis dated 04/09/2021. FINDINGS: CTA CHEST FINDINGS Cardiovascular: Some of the most peripheral subsegmental pulmonary arteries are difficult to definitively characterize due to patient breathing motion artifact, however, there is no pulmonary embolism seen within the main, lobar or central segmental pulmonary arteries bilaterally. No thoracic aortic aneurysm or evidence of aortic dissection. No pericardial effusion. Mediastinum/Nodes: Mediastinal and perihilar lymphadenopathy, including a 1.2 cm short axis lymph node within the anterior mediastinum and a 1.6 cm short axis lymph node within the RIGHT lower paratracheal mediastinum, not significantly changed compared to the recent chest CT of 04/09/2021, compatible with metastatic lymphadenopathy. Additional conglomerate lymphadenopathy is also again seen within the LEFT supraclavicular region, incompletely imaged. z Esophagus is unremarkable. Trachea and central bronchi are unremarkable. Lungs/Pleura: Lungs are clear.  No pleural effusion or pneumothorax. Musculoskeletal: No acute appearing osseous abnormality. Lytic-appearing lesion within the T6 vertebral body is suspicious for osseous metastasis. Additional destructive/lytic changes within the sternum, also suggesting metastatic disease. Conglomerate lymphadenopathy within  the LEFT axilla, and mass/lymphadenopathy in the axillary tail region of the LEFT breast measures 4 cm greatest dimension, all of which is not significantly changed compared to the recent chest CT of 04/09/2021. Additional milder lymphadenopathy within the RIGHT axilla is redemonstrated and stable. Review of the MIP images confirms the above findings. CT ABDOMEN and PELVIS FINDINGS Hepatobiliary: Numerous small hypodense lesions within the bilateral liver lobes, as also described on the earlier CT abdomen  report of 12/12/2019, presumably numerous small liver metastases. Single stone within the otherwise normal-appearing gallbladder. No bile duct dilatation is seen. Pancreas: Unremarkable. No pancreatic ductal dilatation or surrounding inflammatory changes. Spleen: Normal in size without focal abnormality. Adrenals/Urinary Tract: Adrenals are unremarkable. Bilateral renal cysts. Kidneys are otherwise unremarkable without suspicious mass, stone or hydronephrosis. No ureteral or bladder calculi are identified. Bladder is unremarkable. Stomach/Bowel: No dilated large or small bowel loops. Scattered diverticulosis of the descending and sigmoid colon but no focal inflammatory change to suggest acute diverticulitis. No evidence of acute bowel wall inflammation. Stomach is unremarkable. Appendix is normal. Vascular/Lymphatic: Vascular structures of the abdomen and pelvis are unremarkable. Conglomerate lymphadenopathy above the pancreatic head and adjacent to the porta hepatis, as previously described, presumed lymph node metastases. Reproductive: Calcified uterine fibroids. No adnexal mass or free fluid. Other: No free fluid or abscess collection. No free intraperitoneal air. Musculoskeletal: No acute findings. Degenerative spondylosis of the lumbar spine, mild to moderate in degree. 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 Review of the MIP images confirms the above findings. IMPRESSION: 1. No acute findings within the chest. No pulmonary embolism is seen, with mild study limitations detailed above. No evidence of pneumonia or pulmonary edema. 2. Metastatic lymphadenopathy is redemonstrated within the mediastinum, bilateral axillae, and supraclavicular LEFT neck. Also redemonstrated is the mass versus conglomerate lymphadenopathy in the axillary tail region of the LEFT breast. These findings are stable compared to the recent chest CT of 04/09/2021. 3. Lytic-appearing lesion within the T6 vertebral body, and destructive/lytic changes  within the sternum, suspicious for metastatic osseous disease. Consider nuclear medicine bone scan or PET scan for confirmation. 4. Numerous small hypodense lesions within the bilateral liver lobes, as also described on the earlier CT abdomen report of 12/12/2019, presumably liver metastases. 5. Cholelithiasis without evidence of acute cholecystitis. 6. Colonic diverticulosis without evidence of acute diverticulitis. 7. No acute findings within the abdomen or pelvis. No bowel obstruction or evidence of acute bowel wall inflammation. No free fluid or abscess collection. No evidence of acute solid organ abnormality. 8. No evidence of acute osseous fracture or dislocation is seen. Electronically Signed   By: Franki Cabot M.D.   On: 04/27/2021 17:09   CT Hip Left Wo Contrast  Result Date: 04/27/2021 CLINICAL DATA:  Hip trauma, fracture suspected. EXAM: CT OF THE LEFT HIP WITHOUT CONTRAST TECHNIQUE: Multidetector CT imaging of the left hip was performed according to the standard protocol. Multiplanar CT image reconstructions were also generated. COMPARISON:  None. FINDINGS: No evidence of acute fracture or dislocation at the LEFT hip. 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 Deformity of the LEFT humeral head/neck is compatible with previous injury versus chronic deformity related to the overlying degenerative joint disease. Visualized osseous structures of the LEFT hemipelvis appear intact and normally aligned. Additional degenerative change noted at the LEFT SI joint and within the lower lumbar spine. Visualized soft tissues about  the LEFT hip are unremarkable. IMPRESSION: 1. No evidence of acute fracture or dislocation at the LEFT hip. 2. 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 Electronically Signed   By: Franki Cabot M.D.   On: 04/27/2021  17:13   DG CHEST PORT 1 VIEW  Result Date: 04/29/2021 CLINICAL DATA:  Leukocytosis, atrial tachycardia, history of breast cancer EXAM: PORTABLE CHEST 1 VIEW COMPARISON:  04/27/2021 FINDINGS: Right chest wall port catheter is unchanged. Persistent elevation of the right hemidiaphragm. No new consolidation or edema. No pleural effusion. Stable cardiomediastinal contours. IMPRESSION: No acute process in the chest. Electronically Signed   By: Macy Mis M.D.   On: 04/29/2021 08:05   DG Hip Unilat W or Wo Pelvis 2-3 Views Left  Result Date: 04/27/2021 CLINICAL DATA:  [place] 2 weeks ago.  Pain. EXAM: DG HIP (WITH OR WITHOUT PELVIS) 2-3V LEFT COMPARISON:  None. FINDINGS: There is advanced osteoarthritis of the left hip with joint space narrowing, sclerosis, osteophyte and flattening of femoral head. No definite acute regional fracture. 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 IMPRESSION: Advanced osteoarthritis of the left hip. No visible traumatic finding. Flattening of the humeral head which is probably chronic. Electronically Signed   By: Nelson Chimes M.D.   On: 04/27/2021 13:17    Review of Systems  Constitutional:  Positive for activity change, appetite change, fatigue and unexpected weight change.  HENT: Negative.    Eyes: Negative.   Respiratory: Negative.    Cardiovascular:  Positive for leg swelling.  Gastrointestinal: Negative.   Endocrine: Negative for cold intolerance and heat intolerance.  Genitourinary:  Positive for dysuria.  Musculoskeletal:  Positive for arthralgias, back pain, gait problem, joint swelling and myalgias.  All other systems reviewed and are negative. Blood pressure 110/68, pulse 99, temperature 97.7 F (36.5 C), resp. rate 18, height 5\' 3"  (1.6 m), weight 116.6 kg, SpO2 99 %. Physical Exam General appearance the patient is moderately to severe obese developmental abnormalities none nutrition poor body habitus endomorphic  Pulses and temperature  extremities normal varicose veins none  Lymph nodes groin negative  Gait patient unable to ambulate  Skin x4 extremities normal  Coordination test upper extremity deferred because of patient condition same in the lower.  Deep tendon reflexes 0-1+ in the foot and ankle of the right and left leg  All 4 extremities had normal sensation  Patient did appear to be oriented to time person place  Mood and affect mood seem pleasant affect seem good patient obviously frustrated because of her condition  The right upper extremity had no gross malalignment or asymmetry there was no joint contracture or crepitation no evidence of subluxation dislocation and the tone was normal and there was no atrophy gross motor strength normal  The left upper extremity again was normal on inspection with no evidence of contracture or subluxation muscle strength and tone normal  Right lower extremity alignment looks good range of motion was poor stability was normal muscle tone was normal no atrophy or abnormal movements noted  Left lower extremity was held in flexion there was swelling of the left knee there was poor range of motion if any active of the left knee stability tests were deferred because of pain muscle strength and tone around the knee were normal with no tremors   I have read the following films lumbar spine  Left hip  Left knee:   Lumbar spine was tender as well  It was also difficult  to move her left hip  Images lumbar spine multilevel degenerative disc disease and also SI joint arthritis  Left knee degenerative joint disease  Left hip degenerative joint disease  Assessment and plan  Radiculopathy left leg 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  She has a better chance of relief with systemic steroids which I have ordered  I ordered a lumbar spine film and show degenerative disc disease.MRI warranted unless  concerns of recurrent cancer are in play  Also recommend gabapentin to address the radicular pain  Will continue to follow.  Arther Abbott 04/29/2021, 8:24 AM

## 2021-04-29 NOTE — Progress Notes (Signed)
Patient ID: Crystal Vega, female   DOB: [birth year], 70 y.o.   MRN: [id number] PRELIM CONSULT NOTE   Injection will not help  Presumptive dx left leg radiculopathy   L spine films  Gabapentin Steroid trial

## 2021-04-29 NOTE — Progress Notes (Addendum)
HOSPITAL MEDICINE OVERNIGHT EVENT NOTE    Notified that 2 sets of patient's blood cultures are positive for gram-positive cocci.  No other information available unfortunately.  Chart reviewed, patient is currently receiving intravenous ceftriaxone for suspected urinary tract infection.  Unfortunately, even though urinalysis performed on 11/29 is suggestive of urinary tract infection a urine culture has not been performed and therefore there is no organism identified at this point.  I called the lab and was fortunately able to add on urine culture to the patient's urinalysis performed yesterday.  I suspect that the patient's bacteremia and urinary tract infection are all caused by the same gram-positive organism.  With patient's ongoing tachycardia and worsening leukocytosis on labs this morning we will go ahead and add intravenous vancomycin to the antibiotic regimen.  Ceftriaxone can likely be completely discontinued.  Antibiotics will be adjusted based on final culture results.  Vernelle Emerald  MD [facility]   ADDENDUM (11/30 3:40am)  Lab has called again stating that the organism has been identified as being of the Streptococcus species.  With this new information we will go ahead and discontinue the vancomycin and switch patient back to ceftriaxone as previously ordered.  Sherryll Burger Malky Rudzinski

## 2021-04-29 NOTE — Progress Notes (Signed)
Pharmacy Antibiotic Note  Crystal Vega is a 70 y.o. female admitted on 04/27/2021, now with bacteremia.  Pharmacy has been consulted for vancomycin dosing.  Plan: Vancomycin 2000mg  x1 then 1500mg  IV Q 24 hrs. Goal AUC 400-550.  Expected AUC 515.  SCr used 0.8 (current 0.55).   Height: 5\' 3"  (160 cm) Weight: 116.6 kg (257 lb 0.9 oz) IBW/kg (Calculated) : 52.4  Temp (24hrs), Avg:98.7 F (37.1 C), Min:98.3 F (36.8 C), Max:99.7 F (37.6 C)  Recent Labs  Lab 04/27/21 1152 04/28/21 0432  WBC 9.1 14.8*  CREATININE 0.55 0.55    Estimated Creatinine Clearance: 81.8 mL/min (by C-G formula based on SCr of 0.55 mg/dL).    No Known Allergies   Thank you for allowing pharmacy to be a part of this patient's care.  Wynona Neat, PharmD, BCPS  04/29/2021 12:29 AM

## 2021-04-29 NOTE — Progress Notes (Signed)
PT Cancellation Note  Patient Details Name: Crystal Vega MRN: [id number] DOB: March 06, 1951   Cancelled Treatment:    Reason Eval/Treat Not Completed: Pain limiting ability to participate.  Patient declined to participate due to c/o severe pain in LLE despite receiving pain medication prior to attempting therapy.  12:16 PM, 04/29/21 Lonell Grandchild, MPT Physical Therapist with [facility] [phone] office [phone] mobile phone

## 2021-04-29 NOTE — Progress Notes (Addendum)
PROGRESS NOTE   Crystal Vega  ALP:[id number] DOB: Dec 01, 1950 DOA: 04/27/2021 PCP: Lemmie Evens, MD   No chief complaint on file.  Level of care: Telemetry  Brief Admission History:  69 y.o. female with medical history significant for advanced breast cancer undergoing chemotherapy, hypertension, diabetes, colon mass, iron deficiency anemia.  Patient was brought to the ED by EMS with reports of left leg pain.  Reports she was getting out of bed 2 weeks ago, she put her legs on the floor and slid down landing on her butt.  She did not hit her head or and knee when she fell.  Since the fall she has not been able to ambulate.  She has not been able to lift her left leg due to pain.  Prior to the fall she has had chronic problems-with pain involving her left lower extremity hip and knees.  She denies any change in stools, reports her stools are black chronically as she is on iron tablets.  No vomiting of blood and no blood in her stools.  Denies palpitations.  No chest pain, no  difficulty breathing.  She reports she has been lying in bed for the past 2 weeks and her husband brings food for her, but she has not been able to eat well.  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 -Patient is unable to ambulate and complains of  severe pain patient is unable to ambulate  -Left knee leg and hip pain--- suspect radiculopathy, -??  Metastatic disease related -Orthopedic consult from Dr. Aline Brochure appreciated he recommends lumbar spine films , -Gabapentin 300 3 times daily  -Lumbar spine x-rays demonstrate DDD and degenerative facet disease without other acute findings -We will hold off on trial of systemic steroids given bacteremia and concern for acute infection -As per Dr. Aline Brochure steroid injections into the joint would not be helpful  -Strep bacteremia -Blood cultures from 04/28/2021 growing strep --Final ID and sensitivity pending  -Given History of colon cancer there is concern for Possible Strep Bovis (Gallolyticus)  -Echo pending ,   Atrial tachycardia - - -Episode of possible SVT-- ?? either AVNRT or atrial tachycardia.   -Cardiology consult appreciated continue metoprolol 25 twice daily as recommended by cardiology team -Echo pending - UTI - reports having urinary frequency  -Continue IV Rocephin pending final culture data  Left leg DVT - unfortunately had to temporarily hold apixaban due to thrombocytopenia  -Platelets just over 50 K-suspect chemo and malignancy related thrombocytopenia  -Metastatic left breast cancer - she is being treated with chemotherapy by Dr. Delton Coombes  Type 2 diabetes mellitus  Use Novolog/Humalog Sliding scale insulin with Accu-Cheks/Fingersticks as ordered   Right colon adenocarcinoma -Discussed with Dr. Delton Coombes, patient is not a candidate for surgical on aggressive treatment for her colon malignancy  due to metastatic breast cancer  Anemia of neoplastic disease - Transfused 1 unit PRBC on 11/28  - CBC in am  Leukocytosis - possibly due to UTI IV Rocephin pending further culture data  Hyponatremia - asymptomatic  - due to SIADH associated with malignancy - free water restriction   Morbid Obesity- -Low calorie diet, portion control and increase physical  activity when able discussed with patient -Body mass index is 45.54 kg/m.   Social/Ethics--- patient with multiple comorbidities, overall prognosis is poor -Palliative care consult requested  --Stage 2 left and right buttocks pressure injuries present on admission -Wound care as advised    DVT prophylaxis:  SCDs Code Status: Full  Family Communication: discussed plan of care with patient 11/29 Disposition: TBD but anticipate need for SNF  Status is: Inpatient Remains inpatient appropriate because: uncontrolled pain    Consultants:  Palliative care Orthopedics Cardiology Psych consultation with Dr. Delton Coombes  Procedures:  N/a  Antimicrobials:  Ceftriaxone 11/29>>   Subjective: Left knee leg and hip pain No fever  Or chills  -Fatigue and malaise persist  Objective: Vitals:   04/29/21 0400 04/29/21 0438 04/29/21 0812 04/29/21 1455  BP:  110/68  114/64  Pulse: 94 99 92 98  Resp:  18    Temp:  97.7 F (36.5 C)  98.4 F (36.9 C)  TempSrc:    Oral  SpO2:  99%  97%  Weight:      Height:        Intake/Output Summary (Last 24 hours) at 04/29/2021 1737 Last data filed at 04/29/2021 1131 Gross per 24 hour  Intake 720 ml  Output 400 ml  Net 320 ml   Filed Weights   04/27/21 2154 04/28/21 0400 04/28/21 1631  Weight: 115.1 kg 115.1 kg 116.6 kg   Examination:  General exam: chronically ill appearing, Appears calm and comfortable  Respiratory system: port in place left chest wall, no increased work of breathing. Cardiovascular system: normal S1 & S2 heard. No JVD, murmurs, rubs, gallops or clicks. No pedal edema. Gastrointestinal system: Abdomen is nondistended, soft and nontender. No organomegaly or masses felt. Normal bowel sounds heard. Central nervous system: Alert and oriented. No focal neurological deficits. Extremities: very large left knee effusion seen with crepitus, exam limited by severe pain.  Psychiatry: Judgement and insight appear normal. Mood &  affect appropriate. Skin--Stage 2 left and right buttocks pressure injuries     Data Reviewed: I have personally reviewed following labs and imaging studies  CBC: Recent Labs  Lab 04/27/21 1152 04/28/21 0432 04/29/21 0445  WBC 9.1 14.8* 16.6*  NEUTROABS 7.0  --  13.8*  HGB 7.3* 8.1* 7.2*  HCT 23.2* 25.7* 23.7*  MCV 90.6 89.9 91.9  PLT 56* 52* 54*    Basic Metabolic Panel: Recent Labs  Lab 04/27/21 1152 04/28/21 0432 04/29/21 0445  NA 129* 127* 128*  K 3.8 3.6 3.8  CL 94* 95* 97*  CO2 25 23 22   GLUCOSE 147* 148* 112*  BUN 16 15 19   CREATININE 0.55 0.55 0.57  CALCIUM 8.6* 8.3* 8.4*    GFR: Estimated Creatinine Clearance: 81.8 mL/min (by C-G formula based on SCr of 0.57 mg/dL).  Liver Function Tests: Recent Labs  Lab 04/27/21 1152 04/29/21 0445  AST 26 19  ALT 12 9  ALKPHOS 103 82  BILITOT 0.6 0.6  PROT 7.2 6.6  ALBUMIN 2.3* 2.2*    CBG: Recent Labs  Lab 04/28/21 1627 04/28/21 2043 04/29/21 0729 04/29/21 1128 04/29/21 1607  GLUCAP  145* 118* 107* 133* 161*    Recent Results (from the past 240 hour(s))  Resp Panel by RT-PCR (Flu A&B, Covid) Nasopharyngeal Swab     Status: None   Collection Time: 04/27/21  6:07 PM   Specimen: Nasopharyngeal Swab; Nasopharyngeal(NP) swabs in vial transport medium  Result Value Ref Range Status   SARS Coronavirus 2 by RT PCR NEGATIVE NEGATIVE Final    Comment: (NOTE) SARS-CoV-2 target nucleic acids are NOT DETECTED.  The SARS-CoV-2 RNA is generally detectable in upper respiratory specimens during the acute phase of infection. The lowest concentration of SARS-CoV-2 viral copies this assay can detect is 138 copies/mL. A negative result does not preclude SARS-Cov-2 infection and should not be used as the sole basis for treatment or other patient management decisions. A negative result may occur with  improper specimen collection/handling, submission of specimen other than nasopharyngeal swab, presence of viral  mutation(s) within the areas targeted by this assay, and inadequate number of viral copies(<138 copies/mL). A negative result must be combined with clinical observations, patient history, and epidemiological information. The expected result is Negative.  Fact Sheet for Patients:  EntrepreneurPulse.com.au  Fact Sheet for Healthcare Providers:  IncredibleEmployment.be  This test is no t yet approved or cleared by the [place] FDA and  has been authorized for detection and/or diagnosis of SARS-CoV-2 by FDA under an Emergency Use Authorization (EUA). This EUA will remain  in effect (meaning this test can be used) for the duration of the COVID-19 declaration under Section 564(b)(1) of the Act, 21 U.S.C.section 360bbb-3(b)(1), unless the authorization is terminated  or revoked sooner.       Influenza A by PCR NEGATIVE NEGATIVE Final   Influenza B by PCR NEGATIVE NEGATIVE Final    Comment: (NOTE) The Xpert Xpress SARS-CoV-2/FLU/RSV plus assay is intended as an aid in the diagnosis of influenza from Nasopharyngeal swab specimens and should not be used as a sole basis for treatment. Nasal washings and aspirates are unacceptable for Xpert Xpress SARS-CoV-2/FLU/RSV testing.  Fact Sheet for Patients: EntrepreneurPulse.com.au  Fact Sheet for Healthcare Providers: IncredibleEmployment.be  This test is not yet approved or cleared by the [place] FDA and has been authorized for detection and/or diagnosis of SARS-CoV-2 by FDA under an Emergency Use Authorization (EUA). This EUA will remain in effect (meaning this test can be used) for the duration of the COVID-19 declaration under Section 564(b)(1) of the Act, 21 U.S.C. section 360bbb-3(b)(1), unless the authorization is terminated or revoked.  Performed at [facility], [address], [place], [place] [address]   MRSA Next Gen by PCR, Nasal     Status: None    Collection Time: 04/27/21  9:31 PM   Specimen: Nasal Mucosa; Nasal Swab  Result Value Ref Range Status   MRSA by PCR Next Gen NOT DETECTED NOT DETECTED Final    Comment: (NOTE) The GeneXpert MRSA Assay (FDA approved for NASAL specimens only), is one component of a comprehensive MRSA colonization surveillance program. It is not intended to diagnose MRSA infection nor to guide or monitor treatment for MRSA infections. Test performance is not FDA approved in patients less than 54 years old. Performed at [facility], [address], [place], [place] [address]   Culture, blood (routine x 2)     Status: None (Preliminary result)   Collection Time: 04/28/21  7:49 AM   Specimen: BLOOD  Result Value Ref Range Status   Specimen Description   Final    BLOOD RIGHT PORT Performed at  [facility], [address] [place], [place] [address]    Special Requests   Final    BOTTLES DRAWN AEROBIC AND ANAEROBIC Blood Culture adequate volume Performed at [facility], [address], [place], [place] [address]    Culture  Setup Time   Final    GRAM POSITIVE COCCI Gram Stain Report Called to,Read Back By and Verified With: Caraballo,[email or web address]  by [email or web address], b 11.29.22 CRITICAL RESULT CALLED TO, READ BACK BY AND VERIFIED WITH: S ALSTON,RN@0324  04/29/21 [place] IN BOTH AEROBIC AND ANAEROBIC BOTTLES Performed at [facility], [place] [address] [place], [place] [address]    Culture GRAM POSITIVE COCCI  Final   Report Status PENDING  Incomplete  Blood Culture ID Panel (Reflexed)     Status: Abnormal   Collection Time: 04/28/21  7:49 AM  Result Value Ref Range Status   Enterococcus faecalis NOT DETECTED NOT DETECTED Final   Enterococcus Faecium NOT DETECTED NOT DETECTED Final   Listeria monocytogenes NOT DETECTED NOT DETECTED Final   Staphylococcus species NOT DETECTED NOT DETECTED Final   Staphylococcus aureus (BCID) NOT DETECTED NOT DETECTED Final   Staphylococcus epidermidis NOT DETECTED NOT DETECTED Final    Staphylococcus lugdunensis NOT DETECTED NOT DETECTED Final   Streptococcus species DETECTED (A) NOT DETECTED Final    Comment: Not Enterococcus species, Streptococcus agalactiae, Streptococcus pyogenes, or Streptococcus pneumoniae. CRITICAL RESULT CALLED TO, READ BACK BY AND VERIFIED WITH: S ALSTON,RN@0325  04/29/21 [place]    Streptococcus agalactiae NOT DETECTED NOT DETECTED Final   Streptococcus pneumoniae NOT DETECTED NOT DETECTED Final   Streptococcus pyogenes NOT DETECTED NOT DETECTED Final   A.calcoaceticus-baumannii NOT DETECTED NOT DETECTED Final   Bacteroides fragilis NOT DETECTED NOT DETECTED Final   Enterobacterales NOT DETECTED NOT DETECTED Final   Enterobacter cloacae complex NOT DETECTED NOT DETECTED Final   Escherichia coli NOT DETECTED NOT DETECTED Final   Klebsiella aerogenes NOT DETECTED NOT DETECTED Final   Klebsiella oxytoca NOT DETECTED NOT DETECTED Final   Klebsiella pneumoniae NOT DETECTED NOT DETECTED Final   Proteus species NOT DETECTED NOT DETECTED Final   Salmonella species NOT DETECTED NOT DETECTED Final   Serratia marcescens NOT DETECTED NOT DETECTED Final   Haemophilus influenzae NOT DETECTED NOT DETECTED Final   Neisseria meningitidis NOT DETECTED NOT DETECTED Final   Pseudomonas aeruginosa NOT DETECTED NOT DETECTED Final   Stenotrophomonas maltophilia NOT DETECTED NOT DETECTED Final   Candida albicans NOT DETECTED NOT DETECTED Final   Candida auris NOT DETECTED NOT DETECTED Final   Candida glabrata NOT DETECTED NOT DETECTED Final   Candida krusei NOT DETECTED NOT DETECTED Final   Candida parapsilosis NOT DETECTED NOT DETECTED Final   Candida tropicalis NOT DETECTED NOT DETECTED Final   Cryptococcus neoformans/gattii NOT DETECTED NOT DETECTED Final    Comment: Performed at [facility], [address] [place], [place] [address]  Culture, blood (routine x 2)     Status: None (Preliminary result)   Collection Time: 04/28/21  8:34 AM   Specimen:  Right Antecubital; Blood  Result Value Ref Range Status   Specimen Description   Final    RIGHT ANTECUBITAL Performed at [facility], [address] [place], [place] [address]    Special Requests   Final    BOTTLES DRAWN AEROBIC AND ANAEROBIC Blood Culture adequate volume Performed at [facility], [address], [place],  [address]    Culture  Setup Time   Final    GRAM POSITIVE COCCI anaerobic AND AEROBIC BOTTLEbottle Gram  Stain Report Called to,Read Back By and Verified With: Caraballo,[email or web address]  by [email or web address],b 11.29.22 CRITICAL VALUE NOTED.  VALUE IS CONSISTENT WITH PREVIOUSLY REPORTED AND CALLED VALUE. Performed at [facility], [place] [address], [place], [place] [address]    Culture GRAM POSITIVE COCCI  Final   Report Status PENDING  Incomplete     Radiology Studies: DG Lumbar Spine 2-3 Views  Result Date: 04/29/2021 CLINICAL DATA:  Low back and left hip pain EXAM: LUMBAR SPINE - 2-3 VIEW COMPARISON:  CT 04/27/2021 FINDINGS: Limited resolution. Five lumbar type vertebral bodies do not show any visible malalignment. There is degenerative disc disease and degenerative facet disease throughout the lumbar region. Bilateral sacroiliac degenerated changes noted. Advanced chronic arthropathy of the hips, left worse than right. IMPRESSION: Limited imaging. 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 Bilateral sacroiliac arthritis. Electronically Signed   By: Nelson Chimes M.D.   On: 04/29/2021 09:52   DG CHEST PORT 1 VIEW  Result Date: 04/29/2021 CLINICAL DATA:  Leukocytosis, atrial tachycardia, history of breast cancer EXAM: PORTABLE CHEST 1 VIEW COMPARISON:  04/27/2021 FINDINGS: Right chest wall port catheter is unchanged. Persistent elevation of the right hemidiaphragm. No new consolidation or edema. No pleural effusion. Stable cardiomediastinal contours. IMPRESSION: No acute process in the chest. Electronically Signed   By: Macy Mis  M.D.   On: 04/29/2021 08:05    Scheduled Meds:  Chlorhexidine Gluconate Cloth  6 each Topical Daily   ferrous sulfate  325 mg Oral Q breakfast   folic acid  1 mg Oral Daily   gabapentin  300 mg Oral TID   insulin aspart  0-5 Units Subcutaneous QHS   insulin aspart  0-9 Units Subcutaneous TID WC   methylPREDNISolone (SOLU-MEDROL) injection  40 mg Intravenous Q8H   metoprolol tartrate  25 mg Oral BID   potassium chloride  10 mEq Oral Daily   Ensure Max Protein  11 oz Oral Daily   Continuous Infusions:  cefTRIAXone (ROCEPHIN)  IV Stopped (04/29/21 0928)     LOS: 2 days    Roxan Hockey, MD How to contact the [facility] Attending or Consulting provider [place] or covering provider during after hours [place], for this patient?  Check the care team in [facility] and look for a) attending/consulting TRH provider listed and b) the [place] team listed Log into [email or web address] and use [place]'s universal password to access. If you do not have the password, please contact the hospital operator. Locate the [facility] provider you are looking for under [facility] and page to a number that you can be directly reached. If you still have difficulty reaching the provider, please page the [facility] (Director on Call) for the Hospitalists listed on amion for assistance.  04/29/2021, 5:37 PM

## 2021-04-29 NOTE — TOC Progression Note (Signed)
Transition of Care [facility]) - Progression Note    Patient Details  Name: Crystal Vega MRN: [id number] Date of Birth: 1950/12/10  [facility] CM/SW Contact  Shade Flood, LCSW Phone Number: 04/29/2021, 12:53 PM  Clinical Narrative:     TOC following. Spoke with pt today to provide SNF bed offers. Pt selects [facility]. Updated Debbie who states she will start [place] today. Per MD, dc timeframe not yet known. Continued ID workup is in progress.  TOC will follow.  Expected Discharge Plan: [place] Barriers to Discharge: Continued Medical Work up  Expected Discharge Plan and Services Expected Discharge Plan: [place] In-house Referral: Clinical Social Work   Post Acute Care Choice: [place] Living arrangements for the past 2 months: Single Family Home                                       Social Determinants of Health (SDOH) Interventions    Readmission Risk Interventions Readmission Risk Prevention Plan 04/28/2021  Transportation Screening Complete  Home Care Screening Complete  Medication Review (RN CM) Complete  Some recent data might be hidden

## 2021-04-29 NOTE — Progress Notes (Signed)
   Progress Note  Patient Name: Crystal Vega Date of Encounter: 04/29/2021  Primary Cardiologist: Freada Bergeron, MD  Reviewed cardiology consultation from Dr. Johney Frame yesterday regarding episode of SVT suspected to be either AVNRT or atrial tachycardia.  I personally reviewed telemetry which shows sinus rhythm today and no interval SVT.  She remains on Lopressor 25 mg twice daily (started yesterday) with heart rates in the 95F to 47B, systolic blood pressure [phone].  Pertinent lab work includes a sodium 128, potassium 3.8, BUN 19, creatinine 0.57, hemoglobin 7.2, WBC 16.6, platelets 54.  Blood cultures also positive for gram-positive cocci, Streptococcus species with full ID pending.  Follow-up transthoracic echocardiogram ordered by Dr. Denton Brick and pending at this time.  Signed, Rozann Lesches, MD  04/29/2021, 11:05 AM

## 2021-04-30 ENCOUNTER — Inpatient Hospital Stay (HOSPITAL_COMMUNITY): Payer: Medicare HMO

## 2021-04-30 DIAGNOSIS — Z7189 Other specified counseling: Secondary | ICD-10-CM | POA: Diagnosis not present

## 2021-04-30 DIAGNOSIS — M25562 Pain in left knee: Secondary | ICD-10-CM

## 2021-04-30 DIAGNOSIS — R7881 Bacteremia: Secondary | ICD-10-CM

## 2021-04-30 DIAGNOSIS — I10 Essential (primary) hypertension: Secondary | ICD-10-CM | POA: Diagnosis not present

## 2021-04-30 DIAGNOSIS — D649 Anemia, unspecified: Secondary | ICD-10-CM | POA: Diagnosis not present

## 2021-04-30 DIAGNOSIS — C50919 Malignant neoplasm of unspecified site of unspecified female breast: Secondary | ICD-10-CM

## 2021-04-30 DIAGNOSIS — C50611 Malignant neoplasm of axillary tail of right female breast: Secondary | ICD-10-CM | POA: Diagnosis not present

## 2021-04-30 DIAGNOSIS — Z515 Encounter for palliative care: Secondary | ICD-10-CM

## 2021-04-30 DIAGNOSIS — W19XXXD Unspecified fall, subsequent encounter: Secondary | ICD-10-CM | POA: Diagnosis not present

## 2021-04-30 LAB — BASIC METABOLIC PANEL
Anion gap: 6 (ref 5–15)
BUN: 24 mg/dL — ABNORMAL HIGH (ref 8–23)
CO2: 26 mmol/L (ref 22–32)
Calcium: 8.6 mg/dL — ABNORMAL LOW (ref 8.9–10.3)
Chloride: 98 mmol/L (ref 98–111)
Creatinine, Ser: 0.51 mg/dL (ref 0.44–1.00)
GFR, Estimated: >60 mL/min (ref >60)
Glucose, Bld: 192 mg/dL — ABNORMAL HIGH (ref 70–99)
Potassium: 4.2 mmol/L (ref 3.5–5.1)
Sodium: 130 mmol/L — ABNORMAL LOW (ref 135–145)

## 2021-04-30 LAB — CBC
HCT: 22.5 % — ABNORMAL LOW (ref 36.0–46.0)
HCT: 19.5 % — ABNORMAL LOW (ref 36.0–46.0)
Hemoglobin: 7 g/dL — ABNORMAL LOW (ref 12.0–15.0)
Hemoglobin: 6.2 g/dL — CL (ref 12.0–15.0)
MCH: 28.8 pg (ref 26.0–34.0)
MCH: 28.7 pg (ref 26.0–34.0)
MCHC: 31.1 g/dL (ref 30.0–36.0)
MCHC: 31.8 g/dL (ref 30.0–36.0)
MCV: 92.6 fL (ref 80.0–100.0)
MCV: 90.3 fL (ref 80.0–100.0)
Platelets: 55 K/uL — ABNORMAL LOW (ref 150–400)
Platelets: 59 10*3/uL — ABNORMAL LOW (ref 150–400)
RBC: 2.43 MIL/uL — ABNORMAL LOW (ref 3.87–5.11)
RBC: 2.16 MIL/uL — ABNORMAL LOW (ref 3.87–5.11)
RDW: 19.9 % — ABNORMAL HIGH (ref 11.5–15.5)
RDW: 19.8 % — ABNORMAL HIGH (ref 11.5–15.5)
WBC: 8 K/uL (ref 4.0–10.5)
WBC: 11.1 10*3/uL — ABNORMAL HIGH (ref 4.0–10.5)
nRBC: 0 % (ref 0.0–0.2)
nRBC: 0 % (ref 0.0–0.2)

## 2021-04-30 LAB — ECHOCARDIOGRAM COMPLETE
AR max vel: 2.33 cm2
AV Area VTI: 2.32 cm2
AV Area mean vel: 2.06 cm2
AV Mean grad: 3 mmHg
AV Peak grad: 4.9 mmHg
Ao pk vel: 1.11 m/s
Area-P 1/2: 5.93 cm2
Height: 63 in
MV VTI: 2.83 cm2
S' Lateral: 2.5 cm
Weight: 4112.9 [oz_av]

## 2021-04-30 LAB — GLUCOSE, CAPILLARY
Glucose-Capillary: 175 mg/dL — ABNORMAL HIGH (ref 70–99)
Glucose-Capillary: 224 mg/dL — ABNORMAL HIGH (ref 70–99)
Glucose-Capillary: 227 mg/dL — ABNORMAL HIGH (ref 70–99)
Glucose-Capillary: 174 mg/dL — ABNORMAL HIGH (ref 70–99)

## 2021-04-30 LAB — PROCALCITONIN: Procalcitonin: 0.57 ng/mL

## 2021-04-30 LAB — PREPARE RBC (CROSSMATCH)

## 2021-04-30 MED ORDER — SODIUM CHLORIDE 0.9% IV SOLUTION: Freq: Once | INTRAVENOUS | Status: AC

## 2021-04-30 MED ORDER — LIDOCAINE 5 % EX PTCH
1.0000 | MEDICATED_PATCH | Freq: Every day | CUTANEOUS | Status: DC
  Administered 2021-04-30 – 2021-05-06 (×7): 1 via TRANSDERMAL
  Filled 2021-04-30 (×7): qty 1

## 2021-04-30 NOTE — Progress Notes (Signed)
   Progress Note  Patient Name: Crystal Vega Date of Encounter: 04/30/2021  Primary Cardiologist: Freada Bergeron, MD  Telemetry reviewed, patient remains in sinus rhythm and without recurrent episodes of SVT on Lopressor.  She is afebrile with heart rate in the 80s, normal blood pressure.  Lab work reviewed, sodium up to 130, BUN 24, creatinine 0.51, last hemoglobin 7.2 and platelets 54.  Blood cultures positive for Streptococcus mitis/oralis.  Transthoracic echocardiogram is pending.  Signed, Rozann Lesches, MD  04/30/2021, 10:07 AM

## 2021-04-30 NOTE — Progress Notes (Signed)
Date and time results received: 04/30/21 2000   Test: Hgb Critical Value: 6.2  Name of Provider Notified: Dr. Roxan Hockey  Orders Received? Or Actions Taken?: Orders Received - See Orders for details 1 unit of blood to prepare and transfuse

## 2021-04-30 NOTE — Progress Notes (Signed)
*  PRELIMINARY RESULTS* Echocardiogram 2D Echocardiogram has been performed.  Crystal Vega 04/30/2021, 10:11 AM

## 2021-04-30 NOTE — Discharge Planning (Signed)
Oncology Discharge Planning Admission Note  [place] at [facility] Address: [address], [place] [address] Hours of Operation:  8am - 5pm, Monday - Friday  Clinic Contact Information:  [phone]  Oncology Care Team: Medical Oncologist:  Delton Coombes  Dr. Delton Coombes is aware of this hospital admission dated 04/28/2021, and the cancer center will follow Crystal Vega's inpatient care to assist with discharge planning as indicated by the oncologist.  We will reach out to you closer to discharge date to arrange your follow up care.  Disclaimer:  This [place] note does not imply a formal consult request has been made by the admitting attending for this admission or there will be an inpatient consult completed by oncology.  Please request oncology consults as per standard process as indicated.

## 2021-04-30 NOTE — Progress Notes (Signed)
PROGRESS NOTE   Crystal Vega  WLN:[id number] DOB: 21-Sep-1950 DOA: 04/27/2021 PCP: Crystal Evens, MD   No chief complaint on file.  Level of care: Telemetry  Brief Admission History:  70 y.o. female with medical history significant for advanced breast cancer undergoing chemotherapy, hypertension, diabetes, colon mass, iron deficiency anemia.  Patient was brought to the ED by EMS with reports of left leg pain.  Reports she was getting out of bed 2 weeks ago, she put her legs on the floor and slid down landing on her butt.  She did not hit her head or and knee when she fell.  Since the fall she has not been able to ambulate.  She has not been able to lift her left leg due to pain.  Prior to the fall she has had chronic problems-with pain involving her left lower extremity hip and knees.  She denies any change in stools, reports her stools are black chronically as she is on iron tablets.  No vomiting of blood and no blood in her stools.  Denies palpitations.  No chest pain, no  difficulty breathing.  She reports she has been lying in bed for the past 2 weeks and her husband brings food for her, but she has not been able to eat well.  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 -Patient is unable to ambulate and complains of  severe pain patient is unable to ambulate  -Left knee leg and hip pain--- suspect radiculopathy, -??  Metastatic disease related -Orthopedic consult from Dr. Aline Vega appreciated he recommends lumbar spine films , -Gabapentin 300 3 times daily  -Lumbar spine x-rays demonstrate DDD and degenerative facet disease without other acute findings -We will hold off on trial of systemic steroids given bacteremia and concern for acute infection -As per Dr. Aline Vega steroid injections into the joint would not be helpful  -Strep bacteremia -Blood cultures from 04/28/2021 growing strep --Final ID and sensitivity pending  -Given History of colon cancer there is concern for Possible Strep Bovis (Gallolyticus)  -Echo pending ,   Atrial tachycardia - - -Episode of possible SVT-- ?? either AVNRT or atrial tachycardia.   -Cardiology consult appreciated continue metoprolol 25 twice daily as recommended by cardiology team -Echo pending - UTI - reports having urinary frequency  -Continue IV Rocephin pending final culture data  Left leg DVT - unfortunately had to temporarily hold apixaban due to thrombocytopenia  -Platelets just over 50 K-suspect chemo and malignancy related thrombocytopenia  -Metastatic left breast cancer - she is being treated with chemotherapy by Dr. Delton Vega  Type 2 diabetes mellitus  Use Novolog/Humalog Sliding scale insulin with Accu-Cheks/Fingersticks as ordered   Right colon adenocarcinoma -Discussed with Dr. Delton Vega, patient is not a candidate for surgical on aggressive treatment for her colon malignancy  due to metastatic breast cancer -Patient with bloody stool suspect bleeding from colon malignancy  Anemia of neoplastic disease - Transfused 1 unit PRBC on 11/28  - CBC in am  Leukocytosis - possibly due to UTI IV Rocephin pending further culture data  Hyponatremia - asymptomatic  - due to SIADH associated with malignancy - free water restriction   Morbid  Obesity- -Low calorie diet, portion control and increase physical activity when able discussed with patient -Body mass index is 46.2 kg/m.   Social/Ethics--- patient with multiple comorbidities, overall prognosis is poor -Palliative care consult appreciated -Patient is a DNR/DNI -Plan of care discussed with patient's daughters Crystal Vega and Crystal Vega at bedside  --Stage 2 left and right buttocks pressure injuries present on admission -Wound care as advised    DVT prophylaxis:  SCDs Code Status: Full  Family Communication:  Plan of care discussed with patient's daughters Crystal Vega and Crystal Vega at bedside Disposition: TBD but anticipate need for SNF  Status is: Inpatient Remains inpatient appropriate because: uncontrolled pain    Consultants:  Palliative care Orthopedics Cardiology Psych consultation with Dr. Delton Vega  Procedures:  N/a  Antimicrobials:  Ceftriaxone 11/29>>   Subjective: -Plan of care discussed with patient's daughters Crystal Vega and Crystal Vega at bedside -Bloody BM x2 noted repeat CBC pending  Objective: Vitals:   04/30/21 0340 04/30/21 0700 04/30/21 0838 04/30/21 1435  BP: 112/70  118/74 (!) 110/52  Pulse: 84  89   Resp: 19   20  Temp: 98.2 F (36.8 C)   98.6 F (37 C)  TempSrc:      SpO2: 100%   100%  Weight:  118.3 kg    Height:        Intake/Output Summary (Last 24 hours) at 04/30/2021 1922 Last data filed at 04/30/2021 1744 Gross per 24 hour  Intake 700 ml  Output 200 ml  Net 500 ml   Filed Weights   04/28/21 0400 04/28/21 1631 04/30/21 0700  Weight: 115.1 kg 116.6 kg 118.3 kg   Examination:  General exam: chronically ill appearing, Appears calm and comfortable  Respiratory system: port in place left chest wall, no increased work of breathing. Cardiovascular system: normal S1 & S2 heard. No JVD, murmurs, rubs, gallops or clicks. No pedal edema. Gastrointestinal system: Abdomen is nondistended, soft and nontender. No organomegaly or masses felt.  Normal bowel sounds heard. Central nervous system: Alert and oriented. No focal neurological deficits. Extremities: very large left knee effusion seen with crepitus, exam limited by severe pain.  Psychiatry: Judgement and insight appear normal. Mood & affect appropriate. Skin--Stage 2 left and right buttocks pressure injuries     Data Reviewed: I have personally reviewed following labs and imaging studies  CBC: Recent Labs  Lab 04/27/21 1152 04/28/21 0432 04/29/21 0445 04/30/21 0459  WBC 9.1 14.8* 16.6* 8.0  NEUTROABS 7.0  --  13.8*  --   HGB 7.3* 8.1* 7.2* 7.0*  HCT 23.2* 25.7* 23.7* 22.5*  MCV 90.6 89.9 91.9 92.6  PLT 56* 52* 54* 55*    Basic Metabolic Panel: Recent Labs  Lab 04/27/21 1152 04/28/21 0432 04/29/21 0445 04/30/21 0459  NA 129* 127* 128* 130*  K 3.8 3.6 3.8 4.2  CL 94* 95* 97* 98  CO2 25 23 22 26   GLUCOSE 147* 148* 112* 192*  BUN 16 15 19  24*  CREATININE 0.55 0.55 0.57 0.51  CALCIUM 8.6* 8.3* 8.4* 8.6*    GFR: Estimated Creatinine Clearance: 82.6 mL/min (by C-G formula based on SCr of 0.51 mg/dL).  Liver Function Tests: Recent Labs  Lab 04/27/21 1152 04/29/21 0445  AST 26 19  ALT 12 9  ALKPHOS 103 82  BILITOT 0.6 0.6  PROT 7.2 6.6  ALBUMIN 2.3* 2.2*    CBG: Recent Labs  Lab 04/29/21 1607 04/29/21 1953 04/30/21 0721 04/30/21 1158 04/30/21 1605  GLUCAP 161* 272* 175* 224* 227*    Recent Results (from the past 240 hour(s))  Resp Panel by RT-PCR (Flu A&B, Covid) Nasopharyngeal Swab     Status: None   Collection Time: 04/27/21  6:07 PM   Specimen: Nasopharyngeal Swab; Nasopharyngeal(NP) swabs in vial transport medium  Result Value Ref Range Status   SARS Coronavirus 2 by RT PCR NEGATIVE NEGATIVE Final    Comment: (NOTE) SARS-CoV-2 target nucleic acids are NOT DETECTED.  The SARS-CoV-2 RNA is generally detectable in upper respiratory specimens during the acute phase of infection. The lowest concentration of SARS-CoV-2 viral copies  this assay can detect is 138 copies/mL. A negative result does not preclude SARS-Cov-2 infection and should not be used as the sole basis for treatment or other patient management decisions. A negative result may occur with  improper specimen collection/handling, submission of specimen other than nasopharyngeal swab, presence of viral mutation(s) within the areas targeted by this assay, and inadequate number of viral copies(<138 copies/mL). A negative result must be combined with clinical observations, patient history, and epidemiological information. The expected result is Negative.  Fact Sheet for Patients:  EntrepreneurPulse.com.au  Fact Sheet for Healthcare Providers:  IncredibleEmployment.be  This test is no t yet approved or cleared by the [place] FDA and  has been authorized for detection and/or diagnosis of SARS-CoV-2 by FDA under an Emergency Use Authorization (EUA). This EUA will remain  in effect (meaning this test can be used) for the duration of the COVID-19 declaration under Section 564(b)(1) of the Act, 21 U.S.C.section 360bbb-3(b)(1), unless the authorization is terminated  or revoked sooner.       Influenza A by PCR NEGATIVE NEGATIVE Final   Influenza B by PCR NEGATIVE NEGATIVE Final    Comment: (NOTE) The Xpert Xpress SARS-CoV-2/FLU/RSV plus assay is intended as an aid in the diagnosis of influenza from Nasopharyngeal swab specimens and should not be used as a sole basis for treatment. Nasal washings and aspirates are unacceptable for Xpert Xpress SARS-CoV-2/FLU/RSV testing.  Fact Sheet for Patients: EntrepreneurPulse.com.au  Fact Sheet for Healthcare Providers: IncredibleEmployment.be  This test is not yet approved or cleared by the [place] FDA and has been authorized for detection and/or diagnosis of SARS-CoV-2 by FDA under an Emergency Use Authorization (EUA). This EUA  will remain in effect (meaning this test can be used) for the duration of the COVID-19 declaration under Section 564(b)(1) of the Act, 21 U.S.C. section 360bbb-3(b)(1), unless the authorization is terminated or revoked.  Performed at [facility], [address] [place], [place] [address]   Urine Culture     Status: Abnormal (Preliminary result)   Collection Time: 04/27/21  6:07 PM   Specimen: Urine, Clean Catch  Result Value Ref Range Status   Specimen Description   Final    URINE, CLEAN CATCH Performed at [facility], [address], [place], [place] [address]    Special Requests   Final    NONE Performed at [facility], [address], [place], [place] [address]    Culture (A)  Final    >=100,000 COLONIES/mL KLEBSIELLA PNEUMONIAE 50,000 COLONIES/mL PROTEUS MIRABILIS SUSCEPTIBILITIES TO FOLLOW Performed at [facility],  [address], [place], [place] [address]    Report Status PENDING  Incomplete  MRSA Next Gen by PCR, Nasal     Status: None   Collection Time: 04/27/21  9:31 PM   Specimen: Nasal Mucosa; Nasal Swab  Result Value Ref Range Status   MRSA by PCR Next Gen NOT DETECTED NOT DETECTED Final    Comment: (NOTE) The GeneXpert MRSA Assay (FDA approved for NASAL specimens only), is one component of a comprehensive MRSA colonization surveillance program. It is not intended to diagnose MRSA infection nor to guide or monitor treatment for MRSA infections. Test performance is not FDA approved in patients less than 69 years old. Performed at [facility], [address], [place], [place] [address]   Culture, blood (routine x 2)     Status: Abnormal (Preliminary result)   Collection Time: 04/28/21  7:49 AM   Specimen: BLOOD  Result Value Ref Range Status   Specimen Description   Final    BLOOD RIGHT PORT Performed at [facility], [address] [place], [place] [address]    Special Requests   Final    BOTTLES DRAWN AEROBIC AND ANAEROBIC Blood Culture adequate  volume Performed at [facility], [address], [place], [place] [address]    Culture  Setup Time   Final    GRAM POSITIVE COCCI Gram Stain Report Called to,Read Back By and Verified With: Caraballo,[email or web address]  by [email or web address], b 11.29.22 CRITICAL RESULT CALLED TO, READ BACK BY AND VERIFIED WITH: S ALSTON,RN@0324  04/29/21 [place] IN BOTH AEROBIC AND ANAEROBIC BOTTLES    Culture (A)  Final    STREPTOCOCCUS MITIS/ORALIS SUSCEPTIBILITIES TO FOLLOW Performed at [facility], [place] [address] [place], [place] [address]    Report Status PENDING  Incomplete  Blood Culture ID Panel (Reflexed)     Status: Abnormal   Collection Time: 04/28/21  7:49 AM  Result Value Ref Range Status   Enterococcus faecalis NOT DETECTED NOT DETECTED Final   Enterococcus Faecium NOT DETECTED NOT DETECTED Final   Listeria monocytogenes NOT DETECTED NOT DETECTED Final   Staphylococcus species NOT DETECTED NOT DETECTED Final   Staphylococcus aureus (BCID) NOT DETECTED NOT DETECTED Final   Staphylococcus epidermidis NOT DETECTED NOT DETECTED Final   Staphylococcus lugdunensis NOT DETECTED NOT DETECTED Final   Streptococcus species DETECTED (A) NOT DETECTED Final    Comment: Not Enterococcus species, Streptococcus agalactiae, Streptococcus pyogenes, or Streptococcus pneumoniae. CRITICAL RESULT CALLED TO, READ BACK BY AND VERIFIED WITH: S ALSTON,RN@0325  04/29/21 [place]    Streptococcus agalactiae NOT DETECTED NOT DETECTED Final   Streptococcus pneumoniae NOT DETECTED NOT DETECTED Final   Streptococcus pyogenes NOT DETECTED NOT DETECTED Final   A.calcoaceticus-baumannii NOT DETECTED NOT DETECTED Final   Bacteroides fragilis NOT DETECTED NOT DETECTED Final   Enterobacterales NOT DETECTED NOT DETECTED Final   Enterobacter cloacae complex NOT DETECTED NOT DETECTED Final   Escherichia coli NOT DETECTED NOT DETECTED Final   Klebsiella aerogenes NOT DETECTED NOT DETECTED Final   Klebsiella oxytoca NOT DETECTED NOT DETECTED Final    Klebsiella pneumoniae NOT DETECTED NOT DETECTED Final   Proteus species NOT DETECTED NOT DETECTED Final   Salmonella species NOT DETECTED NOT DETECTED Final   Serratia marcescens NOT DETECTED NOT DETECTED Final   Haemophilus influenzae NOT DETECTED NOT DETECTED Final   Neisseria meningitidis NOT DETECTED NOT DETECTED Final   Pseudomonas aeruginosa NOT DETECTED NOT DETECTED Final   Stenotrophomonas maltophilia NOT DETECTED NOT DETECTED Final   Candida albicans NOT DETECTED NOT  DETECTED Final   Candida auris NOT DETECTED NOT DETECTED Final   Candida glabrata NOT DETECTED NOT DETECTED Final   Candida krusei NOT DETECTED NOT DETECTED Final   Candida parapsilosis NOT DETECTED NOT DETECTED Final   Candida tropicalis NOT DETECTED NOT DETECTED Final   Cryptococcus neoformans/gattii NOT DETECTED NOT DETECTED Final    Comment: Performed at [facility], [place] [address], [place], [place] [address]  Culture, blood (routine x 2)     Status: Abnormal (Preliminary result)   Collection Time: 04/28/21  8:34 AM   Specimen: Right Antecubital; Blood  Result Value Ref Range Status   Specimen Description   Final    RIGHT ANTECUBITAL Performed at [facility], [address], [place], [place] [address]    Special Requests   Final    BOTTLES DRAWN AEROBIC AND ANAEROBIC Blood Culture adequate volume Performed at [facility], [address], [place], [place] [address]    Culture  Setup Time   Final    GRAM POSITIVE COCCI anaerobic AND AEROBIC BOTTLEbottle Gram Stain Report Called to,Read Back By and Verified With: Caraballo,[email or web address]  by [email or web address],b 11.29.22 CRITICAL VALUE NOTED.  VALUE IS CONSISTENT WITH PREVIOUSLY REPORTED AND CALLED VALUE. Performed at [facility], [place] [address], [place], [place] [address]    Culture STREPTOCOCCUS MITIS/ORALIS (A)  Final   Report Status PENDING  Incomplete     Radiology Studies: DG Lumbar Spine 2-3 Views  Result Date: 04/29/2021 CLINICAL DATA:  Low back and  left hip pain EXAM: LUMBAR SPINE - 2-3 VIEW COMPARISON:  CT 04/27/2021 FINDINGS: Limited resolution. Five lumbar type vertebral bodies do not show any visible malalignment. There is degenerative disc disease and degenerative facet disease throughout the lumbar region. Bilateral sacroiliac degenerated changes noted. Advanced chronic arthropathy of the hips, left worse than right. IMPRESSION: Limited imaging. 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 Bilateral sacroiliac arthritis. Electronically Signed   By: Nelson Chimes M.D.   On: 04/29/2021 09:52   DG CHEST PORT 1 VIEW  Result Date: 04/29/2021 CLINICAL DATA:  Leukocytosis, atrial tachycardia, history of breast cancer EXAM: PORTABLE CHEST 1 VIEW COMPARISON:  04/27/2021 FINDINGS: Right chest wall port catheter is unchanged. Persistent elevation of the right hemidiaphragm. No new consolidation or edema. No pleural effusion. Stable cardiomediastinal contours. IMPRESSION: No acute process in the chest. Electronically Signed   By: Macy Mis M.D.   On: 04/29/2021 08:05   ECHOCARDIOGRAM COMPLETE  Result Date: 04/30/2021    ECHOCARDIOGRAM REPORT   Patient Name:   BRIYA LOOKABAUGH Date of Exam: 04/30/2021 Medical Rec #:  [id number]       Height:       63.0 in Accession #:    [id number]      Weight:       257.1 lb Date of Birth:  [birth year]       BSA:          2.151 m Patient Age:    65 years        BP:           112/70 mmHg Patient Gender: F               HR:           84 bpm. Exam Location:  [facility] Procedure: 2D Echo, Cardiac Doppler and Color Doppler Indications:    Bacteremia  History:        Patient has prior history of Echocardiogram examinations,  most                 recent 09/20/1942. Signs/Symptoms:Chest Pain; Risk                 Factors:Hypertension and Diabetes. Port-a-cath access only, no                 IV, Breast CA,.  Sonographer:    Wenda Low Referring Phys: [id number] Iyana Topor  Sonographer  Comments: Patient is morbidly obese and Technically difficult study due to poor echo windows. IMPRESSIONS  1. Left ventricular ejection fraction, by estimation, is 60 to 65%. The left ventricle has normal function. Left ventricular endocardial border not optimally defined to evaluate regional wall motion. There is moderate left ventricular hypertrophy. Left ventricular diastolic parameters are indeterminate.  2. Right ventricular systolic function is normal. The right ventricular size is mildly enlarged. There is normal pulmonary artery systolic pressure. The estimated right ventricular systolic pressure is 68.1 mmHg.  3. The mitral valve is grossly normal. Trivial mitral valve regurgitation.  4. The aortic valve is tricuspid. Aortic valve regurgitation is not visualized. Aortic valve sclerosis is present, with no evidence of aortic valve stenosis. Aortic valve mean gradient measures 3.0 mmHg.  5. The inferior vena cava is normal in size with greater than 50% respiratory variability, suggesting right atrial pressure of 3 mmHg. Comparison(s): No significant change from prior study. Prior images reviewed side by side. FINDINGS  Left Ventricle: Left ventricular ejection fraction, by estimation, is 60 to 65%. The left ventricle has normal function. Left ventricular endocardial border not optimally defined to evaluate regional wall motion. The left ventricular internal cavity size was normal in size. There is moderate left ventricular hypertrophy. Left ventricular diastolic parameters are indeterminate. Right Ventricle: The right ventricular size is mildly enlarged. No increase in right ventricular wall thickness. Right ventricular systolic function is normal. There is normal pulmonary artery systolic pressure. The tricuspid regurgitant velocity is 2.64  m/s, and with an assumed right atrial pressure of 3 mmHg, the estimated right ventricular systolic pressure is 27.5 mmHg. Left Atrium: Left atrial size was normal in  size. Right Atrium: Right atrial size was normal in size. Pericardium: There is no evidence of pericardial effusion. Mitral Valve: The mitral valve is grossly normal. Trivial mitral valve regurgitation. MV peak gradient, 3.7 mmHg. The mean mitral valve gradient is 2.0 mmHg. Tricuspid Valve: The tricuspid valve is grossly normal. Tricuspid valve regurgitation is mild. Aortic Valve: The aortic valve is tricuspid. There is mild aortic valve annular calcification. Aortic valve regurgitation is not visualized. Aortic valve sclerosis is present, with no evidence of aortic valve stenosis. Aortic valve mean gradient measures  3.0 mmHg. Aortic valve peak gradient measures 4.9 mmHg. Aortic valve area, by VTI measures 2.32 cm. Pulmonic Valve: The pulmonic valve was grossly normal. Pulmonic valve regurgitation is trivial. Aorta: The aortic root is normal in size and structure. Venous: The inferior vena cava is normal in size with greater than 50% respiratory variability, suggesting right atrial pressure of 3 mmHg. IAS/Shunts: No atrial level shunt detected by color flow Doppler.  LEFT VENTRICLE PLAX 2D LVIDd:         3.70 cm LVIDs:         2.50 cm LV PW:         1.40 cm LV IVS:        1.30 cm LVOT diam:     2.00 cm LV SV:  61 LV SV Index:   28 LVOT Area:     3.14 cm  RIGHT VENTRICLE RV Basal diam:  3.45 cm RV Mid diam:    3.70 cm RV S prime:     11.70 cm/s LEFT ATRIUM           Index        RIGHT ATRIUM           Index LA diam:      3.00 cm 1.39 cm/m   RA Area:     12.60 cm LA Vol (A4C): 26.8 ml 12.46 ml/m  RA Volume:   31.80 ml  14.78 ml/m  AORTIC VALVE                    PULMONIC VALVE AV Area (Vmax):    2.33 cm     PV Vmax:       0.86 m/s AV Area (Vmean):   2.06 cm     PV Peak grad:  3.0 mmHg AV Area (VTI):     2.32 cm AV Vmax:           111.00 cm/s AV Vmean:          81.400 cm/s AV VTI:            0.263 m AV Peak Grad:      4.9 mmHg AV Mean Grad:      3.0 mmHg LVOT Vmax:         82.20 cm/s LVOT Vmean:         53.400 cm/s LVOT VTI:          0.194 m LVOT/AV VTI ratio: 0.74  AORTA Ao Root diam: 3.10 cm Ao Asc diam:  3.10 cm MITRAL VALVE               TRICUSPID VALVE MV Area (PHT): 5.93 cm    TR Peak grad:   27.9 mmHg MV Area VTI:   2.83 cm    TR Vmax:        264.00 cm/s MV Peak grad:  3.7 mmHg MV Mean grad:  2.0 mmHg    SHUNTS MV Vmax:       0.96 m/s    Systemic VTI:  0.19 m MV Vmean:      67.4 cm/s   Systemic Diam: 2.00 cm MV Decel Time: 128 msec MV E velocity: 94.30 cm/s MV A velocity: 91.30 cm/s MV E/A ratio:  1.03 Rozann Lesches MD Electronically signed by Rozann Lesches MD Signature Date/Time: 04/30/2021/10:53:15 AM    Final     Scheduled Meds:  Chlorhexidine Gluconate Cloth  6 each Topical Daily   ferrous sulfate  325 mg Oral Q breakfast   folic acid  1 mg Oral Daily   gabapentin  300 mg Oral TID   insulin aspart  0-5 Units Subcutaneous QHS   insulin aspart  0-9 Units Subcutaneous TID WC   lidocaine  1 patch Transdermal Daily   metoprolol tartrate  25 mg Oral BID   potassium chloride  10 mEq Oral Daily   Ensure Max Protein  11 oz Oral Daily   Continuous Infusions:  cefTRIAXone (ROCEPHIN)  IV Stopped (04/30/21 0914)     LOS: 3 days    Roxan Hockey, MD How to contact the [facility] Attending or Consulting provider 7A - 7P or covering provider during after hours [place], for this patient?  Check the care team in [facility] and look for a) attending/consulting TR[facility] provider listed  and b) the [facility] team listed Log into [email or web address] and use [facility]'s universal password to access. If you do not have the password, please contact the hospital operator. Locate the [facility] provider you are looking for under [facility] and page to a number that you can be directly reached. If you still have difficulty reaching the provider, please page the [facility] (Director on Call) for the Hospitalists listed on amion for assistance.  04/30/2021, 7:22 PM

## 2021-04-30 NOTE — Progress Notes (Signed)
Offered to call and updated daughters on need for blood. Pt refused said she would update them tomorrow.

## 2021-04-30 NOTE — Consult Note (Addendum)
Consultation Note Date: 04/30/2021   Patient Name: Crystal Vega  DOB: 1951/04/17  MRN: [id number]  Age / Sex: 70 y.o., female  PCP: Lemmie Evens, MD Referring Physician: Roxan Hockey, MD  Reason for Consultation: Establishing goals of care  HPI/Patient Profile: 70 y.o. 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 Delton Coombes), left leg DVT, right colon adenocarcinoma, hypertension, diabetes, colon mass, iron deficiency anemia admitted on 04/27/2021 with fall with left knee pain and found to have severe osteoarthritis of knee as well as degenerative changes in hip. Scan also with evidence of metastatic lymphadenopathy, lytic lesion T6 vertebrae, and multiple liver lesions. Also being treated for Strep bacteremia and potential UTI. Cardiology following for atrial tachycardia. Overall declining functional status and stage 2 pressure injuries to bilateral buttocks.   Clinical Assessment and Goals of Care: I met today with Crystal Vega but no family/visitors at bedside. She is awake and oriented. She is resting in bed and watching television. She shares openly about her disease process and being overwhelmed with her complex health issues. She expresses that she always worries. She is frustrated that she has not been able to return to treatment for her breast cancer (she says it has been 3 weeks since her last treatment). She wishes for pain management with a goal to return to her treatment.   Crystal Vega also shares with me that she knows that her condition is worsening. She knows that she has a poor prognosis. She has a very supportive family with her husband of >40 years, 2 adult daughters, 2 grandsons. She worries about not being ambulatory and the strain on her husband to care for her. She talks much of her faith and her  family. Her family brings her joy. She talks of acceptance of dying when her time comes (although she is hopeful for as much time as possible) but reflects that she has lived a good life. She talks of trying to prepare her family that one day she will not be here. She has shared her desire for cremation with them. We talk of her desire for resuscitation efforts and she shares that she does not want this but wants to speak with her husband and daughters about this. She desires DNR but does not want this in place until she speaks with her family. She is hopeful to continue all interventions to prolong her short of resuscitation. I spoke with her about having palliative care to follow with her at home and she will consider. I explained palliative care role for support and help with some of the hard discussions as well as contribute to symptom management to optimize quality of life.   Crystal Vega also shares about her left knee pain which is consistent but significantly worse with movement. This is greatly impacting her functional status. She is hoping for some improvement in pain control in order to be more functional. Recommendations listed below "System Management."  All  questions/concerns addressed. Emotional support provided.   Primary Decision Maker PATIENT    SUMMARY OF RECOMMENDATIONS   - Expressed desire for DNR but she wishes to speak with her family prior to this being ordered.  - Considering outpatient palliative to follow.  - Hopeful to get back to her chemotherapy treatment.  - Hopeful for improved pain control in left knee.   Code Status/Advance Care Planning: Full code - she is speaking with her family about desire for DNR status   Symptom Management:  Left knee arthritic pain with neuropathy:  Heat/ice trials.  Agree with gabapentin.  Lidoderm patch trial.  Consider adding duloxetine 30 mg daily. Increase to 60 mg after 1 week.  Premedicate with opioid prior to activity (i.e.  bath, appointments, etc). Noted prescriptions for Norco 5-325 and hydromorphone 2 mg on PDMP.  No NSAIDs due to blood in stool.  Consider trial systemic steroids when medically able if needed.   Palliative Prophylaxis:  Bowel Regimen, Frequent Pain Assessment, and Turn Reposition  Prognosis:  Overall prognosis poor with advanced cancer.   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 The following aspects are pertinent.  Past Medical History:  Diagnosis Date   Anemia    Arthritis    per patient " left knee"   DVT (deep venous thrombosis) (HCC)    left leg   Dyspnea    Essential hypertension, benign    Family history of cancer of female genital organ    Family history of GI tract cancer    Metastatic breast cancer ([place])    left breast   Port-A-Cath in place 12/25/2019   Type 2 diabetes mellitus ([place])    Social History   Socioeconomic History   Marital status: Married    Spouse name: Herbie [place]   Number of children: 2   Years of education: Not on file   Highest education level: Not on file  Occupational History   Occupation: retired  Tobacco Use   Smoking status: Former    Types: Cigarettes   Smokeless tobacco: Never   Tobacco comments:    quit about 40+ years ago  Vaping Use   Vaping Use: Never used  Substance and Sexual Activity   Alcohol use: Never   Drug use: No   Sexual activity: Not on file  Other Topics Concern   Not on file  Social History Narrative   Not on file   Social Determinants of Health   Financial Resource Strain: Not on file  Food Insecurity: Not on file  Transportation Needs: Not on file  Physical Activity: Not on file   Stress: Not on file  Social Connections: Not on file   Family History  Problem Relation Age of Onset   Diabetes Mellitus II Father    Congestive Heart Failure Father    Colon cancer Mother 45       patient not sure if colon vs stomach   Stroke Maternal Grandmother    Cancer Cousin        female reproductive cancer, dx. 50s/60s   Scheduled Meds:  Chlorhexidine Gluconate Cloth  6 each Topical Daily   ferrous sulfate  325 mg Oral Q  breakfast   folic acid  1 mg Oral Daily   gabapentin  300 mg Oral TID   insulin aspart  0-5 Units Subcutaneous QHS   insulin aspart  0-9 Units Subcutaneous TID WC   metoprolol tartrate  25 mg Oral BID   potassium chloride  10 mEq Oral Daily   Ensure Max Protein  11 oz Oral Daily   Continuous Infusions:  cefTRIAXone (ROCEPHIN)  IV 2 g (04/30/21 0844)   PRN Meds:.acetaminophen **OR** acetaminophen, HYDROmorphone (DILAUDID) injection, ondansetron **OR** ondansetron (ZOFRAN) IV, polyethylene glycol No Known Allergies Review of Systems  Constitutional:  Positive for activity change and appetite change.  Gastrointestinal:  Positive for blood in stool and diarrhea. Negative for abdominal pain.  Musculoskeletal:  Positive for arthralgias and joint swelling.  Neurological:  Positive for weakness.   Physical Exam Vitals and nursing note reviewed.  Constitutional:      General: She is not in acute distress.    Appearance: She is ill-appearing.  Cardiovascular:     Rate and Rhythm: Normal rate.  Pulmonary:     Effort: No tachypnea, accessory muscle usage or respiratory distress.  Abdominal:     Palpations: Abdomen is soft.  Musculoskeletal:     Left knee: Swelling present. Tenderness present.  Neurological:     Mental Status: She is alert and oriented to person, place, and time.  Psychiatric:     Comments: Good spirits    Vital Signs: BP 118/74   Pulse 89   Temp 98.2 F (36.8 C)   Resp 19   Ht 5' 3"  (1.6 m)   Wt 116.6 kg   SpO2 100%   BMI  45.54 kg/m  Pain Scale: 0-10   Pain Score: 0-No pain   SpO2: SpO2: 100 % O2 Device:SpO2: 100 % O2 Flow Rate: .   IO: Intake/output summary:  Intake/Output Summary (Last 24 hours) at 04/30/2021 0856 Last data filed at 04/30/2021 0800 Gross per 24 hour  Intake 660 ml  Output 100 ml  Net 560 ml    LBM: Last BM Date: 04/26/21 Baseline Weight: Weight: 115.7 kg Most recent weight: Weight: 116.6 kg     Palliative Assessment/Data:     Time In: 1115 Time Out: 1225 Time Total: 70 min Greater than 50%  of this time was spent counseling and coordinating care related to the above assessment and plan.  Signed by: Vinie Sill, NP Palliative Medicine Team Pager # [phone] (M-F 8a-5p) Team Phone # [phone] (Nights/Weekends)

## 2021-05-01 DIAGNOSIS — R000 Tachycardia, unspecified: Secondary | ICD-10-CM | POA: Diagnosis not present

## 2021-05-01 DIAGNOSIS — I10 Essential (primary) hypertension: Secondary | ICD-10-CM | POA: Diagnosis not present

## 2021-05-01 DIAGNOSIS — W19XXXD Unspecified fall, subsequent encounter: Secondary | ICD-10-CM | POA: Diagnosis not present

## 2021-05-01 DIAGNOSIS — I82412 Acute embolism and thrombosis of left femoral vein: Secondary | ICD-10-CM | POA: Diagnosis not present

## 2021-05-01 LAB — URINE CULTURE: Culture: >=100000 — AB

## 2021-05-01 LAB — COMPREHENSIVE METABOLIC PANEL
ALT: 13 U/L (ref 0–44)
AST: 30 U/L (ref 15–41)
Albumin: 2 g/dL — ABNORMAL LOW (ref 3.5–5.0)
Alkaline Phosphatase: 93 U/L (ref 38–126)
Anion gap: 8 (ref 5–15)
BUN: 23 mg/dL (ref 8–23)
CO2: 23 mmol/L (ref 22–32)
Calcium: 8.2 mg/dL — ABNORMAL LOW (ref 8.9–10.3)
Chloride: 102 mmol/L (ref 98–111)
Creatinine, Ser: 0.51 mg/dL (ref 0.44–1.00)
GFR, Estimated: >60 mL/min (ref >60)
Glucose, Bld: 129 mg/dL — ABNORMAL HIGH (ref 70–99)
Potassium: 4.1 mmol/L (ref 3.5–5.1)
Sodium: 133 mmol/L — ABNORMAL LOW (ref 135–145)
Total Bilirubin: 0.4 mg/dL (ref 0.3–1.2)
Total Protein: 5.9 g/dL — ABNORMAL LOW (ref 6.5–8.1)

## 2021-05-01 LAB — CBC
HCT: 23.7 % — ABNORMAL LOW (ref 36.0–46.0)
Hemoglobin: 7.3 g/dL — ABNORMAL LOW (ref 12.0–15.0)
MCH: 28.2 pg (ref 26.0–34.0)
MCHC: 30.8 g/dL (ref 30.0–36.0)
MCV: 91.5 fL (ref 80.0–100.0)
Platelets: 56 10*3/uL — ABNORMAL LOW (ref 150–400)
RBC: 2.59 MIL/uL — ABNORMAL LOW (ref 3.87–5.11)
RDW: 18.8 % — ABNORMAL HIGH (ref 11.5–15.5)
WBC: 10.5 10*3/uL (ref 4.0–10.5)
nRBC: 0 % (ref 0.0–0.2)

## 2021-05-01 LAB — CULTURE, BLOOD (ROUTINE X 2)
Special Requests: ADEQUATE
Special Requests: ADEQUATE

## 2021-05-01 LAB — GLUCOSE, CAPILLARY
Glucose-Capillary: 115 mg/dL — ABNORMAL HIGH (ref 70–99)
Glucose-Capillary: 169 mg/dL — ABNORMAL HIGH (ref 70–99)
Glucose-Capillary: 230 mg/dL — ABNORMAL HIGH (ref 70–99)
Glucose-Capillary: 156 mg/dL — ABNORMAL HIGH (ref 70–99)

## 2021-05-01 NOTE — Progress Notes (Signed)
Chaplain engaged in a follow-up visit with Crystal Vega.  Chaplain has been spending time with Crystal Vega in the [facility] while she has been receiving chemo for the past several months.  She was grateful to have Chaplain come by.  Crystal Vega was able to be very candid about how she is feeling.  She conveyed that she is in a place of just not caring anymore and not having any answers.  Though she mentioned that she never wanted to be in a mental state like she is currently, she just has no emotions towards what is currently happening in her body.  She feels like every time she receives information from the medical staff she is told something bad.  She wanted to come in and simply focus on what was happening with her leg but was told more life-changing information about her cancer.  She thought that she was getting better and overcoming a hurdle only to be thrown more hurdles to get over.    Crystal Vega and Chaplain talked about her options.  She feels like going to rehab doesn't make sense because she continues to bleed.  She stated, "Why would I go to rehab if I'm dying?"  Chaplain and A[place]ika talked about the possibilities that exist if she is able to regain some strength in her body.  She is aware right now that she is too weak to get chemo or have any procedure or surgery.  Chaplain and Crystal Vega discussed the ways chemo could become an option again through some work and time.  Crystal Vega still holds the understanding that she is not getting well and that the pain of moving her leg and arm are too much.  She also knows that she can't go home again because her husband is unable to take care of her the way she needs right now.    Chaplain asked Crystal Vega about her feelings about death.  She recounted her thoughts about death at an early age when her mom passed.  She pondered how her mom would be able to breathe underground in her coffin.  She described those early musings about death as not  having an understanding and being very young.  She now finds herself of being in a place of indifference.  She voiced that "either way I'll be satisfied."    Crystal Vega also spent some time talking about what it has been like trying to convey her feelings to those who are not walking in her shoes.  It has been hard to hear people tell her, "You'll be fine" or "You'll get better," when her experience has not been that.  It has also been hard to hear people say for her to have hope and to fight when she continuously receives bad news.  Crystal Vega also voiced how hard making these decisions are for her and even though medical staff has empowered her to do what is right and best for her, that does not make it any easier.    Chaplain affirmed Crystal Vega's thoughts and feelings.  Chaplain also worked to think about Crystal Vega's perspective and the ways she was perceiving information regarding "giving up," healing, and death.  Chaplain assesses that Crystal Vega would like to continue living but does not see any hope in getting to rehab or getting on chemo again.  She would like to continue interventions but does not see how they will help or how she will regain her strength.  Chaplain assesses  that she is coming to terms with dying and death.  It will be helpful for Crystal Vega to continue to have conversations with her husband and daughters all together, as well as possibly hear her possibilities again from the medical staff.   Chaplain offered listening, presence, support, and prayer.  Chaplain offered a prayer of peace, clarity, and comfort over her.  Chaplain and Crystal Vega also spent time talking about her family.  Chaplain let the [place] team know that Mrs. Edithe could benefit from seeing some familiar faces.     05/01/21 1100  Clinical Encounter Type  Visited With Patient  Visit Type Follow-up;Spiritual support  Referral From Palliative care team  Consult/Referral To Chaplain   Spiritual Encounters  Spiritual Needs Prayer  Stress Factors  Patient Stress Factors Health changes;Loss of control;Major life changes;Exhausted

## 2021-05-01 NOTE — Care Management Important Message (Signed)
Important Message  Patient Details  Name: Crystal Vega MRN: [id number] Date of Birth: [birth year]   Medicare Important Message Given:  Yes     Tommy Medal 05/01/2021, 1:46 PM

## 2021-05-01 NOTE — Progress Notes (Signed)
PROGRESS NOTE   Crystal Vega  WEX:[id number] DOB: [birth year] DOA: 04/27/2021 PCP: Lemmie Evens, MD   No chief complaint on file.  Level of care: Telemetry  Brief Admission History:  70 y.o. female with medical history significant for advanced breast cancer undergoing chemotherapy, hypertension, diabetes, colon mass, iron deficiency anemia.  Patient was brought to the ED by EMS with reports of left leg pain.  Reports she was getting out of bed 2 weeks ago, she put her legs on the floor and slid down landing on her butt.  She did not hit her head or and knee when she fell.  Since the fall she has not been able to ambulate.  She has not been able to lift her left leg due to pain.  Prior to the fall she has had chronic problems-with pain involving her left lower extremity hip and knees.  She denies any change in stools, reports her stools are black chronically as she is on iron tablets.  No vomiting of blood and no blood in her stools.  Denies palpitations.  No chest pain, no  difficulty breathing.  She reports she has been lying in bed for the past 2 weeks and her husband brings food for her, but she has not been able to eat well.  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 -Patient is unable to ambulate and complains of  severe pain patient is unable to ambulate  -Left knee leg and hip pain--- suspect radiculopathy, -??  Metastatic disease related -Orthopedic consult from Dr. Aline Brochure appreciated he recommends lumbar spine films , -Gabapentin 300 3 times daily  -Lumbar spine x-rays demonstrate DDD and degenerative facet disease without other acute findings -We will hold off on trial of systemic steroids given bacteremia and concern for acute infection -As per Dr. Aline Brochure steroid injections into the joint would not be helpful  -Positive blood cultures -Blood cultures from 04/28/2021 growing strep Mitis/Oralis and Staph Hominis  -Suspect contaminant -Given History of colon cancer there was initially concern for Possible Strep Bovis (Gallolyticus)  -Echo with preserved EF of 60 to 65% with vegetations  Atrial tachycardia - - -Episode of possible SVT-- ?? either AVNRT or atrial tachycardia.   -Cardiology consult appreciated continue metoprolol 25 twice daily as recommended by cardiology team -Echo pending - UTI -urine culture from 04/27/2021 with Klebsiella pneumonia and Proteus mirabilis mostly pansensitive -On IV Rocephin last dose 05/03/2021  Left leg DVT - unfortunately had to temporarily hold apixaban due to thrombocytopenia  -Platelets just over 50 K-suspect chemo and malignancy related thrombocytopenia  -Metastatic left breast cancer - she is being treated with chemotherapy by Dr. Delton Coombes  Type 2 diabetes mellitus  Use Novolog/Humalog Sliding scale insulin with Accu-Cheks/Fingersticks as ordered   Right colon adenocarcinoma -Discussed with Dr. Delton Coombes,  patient is not a candidate for surgical on aggressive treatment for her colon malignancy due to metastatic breast cancer -Patient with bloody stool suspect bleeding from colon malignancy  Anemia of neoplastic disease with superimposed acute blood loss anemia from: Malignancy - Transfused 1 unit PRBC on 04/27/21 -Hgb currently greater  than 7 posttransfusion  Leukocytosis - possibly due to UTI IV Rocephin pending further culture data  Hyponatremia - asymptomatic  - due to SIADH associated with malignancy - free water restriction   Morbid Obesity- -Low calorie diet, portion control and increase physical activity when able discussed with patient -Body mass index is 46.2 kg/m.   Social/Ethics--- patient with multiple comorbidities, overall prognosis is poor -Palliative care consult appreciated -Patient is a DNR/DNI -Plan of care discussed with patient's daughters [name] and [name] at bedside  --Stage 2 left and right buttocks pressure injuries present on admission -Wound care as advised  Weakness and deconditioning/ambulatory dysfunction--- PT eval appreciated recommends SNF rehab  DVT prophylaxis:  SCDs Code Status: Full  Family Communication:  Plan of care discussed with patient's daughters [name] and [name] at bedside Disposition: Discharge to SNF rehab when bed available and insurance authorization is available  status is: Inpatient Remains inpatient appropriate because: uncontrolled pain    Consultants:  Palliative care Orthopedics Cardiology Psych consultation with Dr. Delton Coombes  Procedures:  N/a  Antimicrobials:  Ceftriaxone 11/29>> 05/03/21  Subjective: --No further bloody BMs -No abdominal pain oral intake is fair -Patient son-in-law is at bedside  Objective: Vitals:   05/01/21 0202 05/01/21 0432 05/01/21 0850 05/01/21 1426  BP: 107/71 103/63 118/76 (!) 98/51  Pulse: 99 (!) 103 (!) 105 90  Resp: 16 19  20   Temp: 98.3 F (36.8 C) 98.3 F (36.8 C)  98 F (36.7 C)  TempSrc:    Oral  SpO2: 100% 100%  100%  Weight:      Height:        Intake/Output Summary (Last 24 hours) at 05/01/2021 2032 Last data filed at 05/01/2021 1700 Gross per 24 hour  Intake 1440 ml  Output 400 ml  Net 1040 ml   Filed Weights   04/28/21 0400 04/28/21 1631 04/30/21 0700  Weight: 115.1 kg 116.6 kg  118.3 kg   Examination:  General exam: chronically ill appearing, Appears calm and comfortable  Respiratory system: port in place left chest wall, no increased work of breathing. Cardiovascular system: normal S1 & S2 heard. No JVD, murmurs, rubs, gallops or clicks. No pedal edema. Gastrointestinal system: Abdomen is nondistended, soft and nontender. No organomegaly or masses felt. Normal bowel sounds heard. Central nervous system: Alert and oriented. No focal neurological deficits. Extremities: very large left knee effusion seen with crepitus, exam limited by severe pain.  Psychiatry: Judgement and insight appear normal. Mood & affect appropriate. Skin--Stage 2 left and right buttocks pressure injuries     Data Reviewed: I have personally reviewed following labs and imaging studies  CBC: Recent Labs  Lab 04/27/21 1152 04/28/21 0432 04/29/21 0445 04/30/21 0459 04/30/21 1936 05/01/21 0500  WBC 9.1 14.8* 16.6* 8.0 11.1* 10.5  NEUTROABS 7.0  --  13.8*  --   --   --   HGB 7.3* 8.1* 7.2* 7.0* 6.2* 7.3*  HCT 23.2* 25.7* 23.7* 22.5* 19.5* 23.7*  MCV 90.6 89.9 91.9 92.6 90.3 91.5  PLT 56* 52* 54* 55* 59* 56*    Basic Metabolic Panel: Recent Labs  Lab 04/27/21 1152 04/28/21 0432 04/29/21 0445 04/30/21 0459 05/01/21 0500  NA 129* 127* 128*  130* 133*  K 3.8 3.6 3.8 4.2 4.1  CL 94* 95* 97* 98 102  CO2 25 23 22 26 23   GLUCOSE 147* 148* 112* 192* 129*  BUN 16 15 19  24* 23  CREATININE 0.55 0.55 0.57 0.51 0.51  CALCIUM 8.6* 8.3* 8.4* 8.6* 8.2*    GFR: Estimated Creatinine Clearance: 82.6 mL/min (by C-G formula based on SCr of 0.51 mg/dL).  Liver Function Tests: Recent Labs  Lab 04/27/21 1152 04/29/21 0445 05/01/21 0500  AST 26 19 30   ALT 12 9 13   ALKPHOS 103 82 93  BILITOT 0.6 0.6 0.4  PROT 7.2 6.6 5.9*  ALBUMIN 2.3* 2.2* 2.0*    CBG: Recent Labs  Lab 04/30/21 1605 04/30/21 2017 05/01/21 0723 05/01/21 1140 05/01/21 1827  GLUCAP 227* 174* 115* 169* 230*     Recent Results (from the past 240 hour(s))  Resp Panel by RT-PCR (Flu A&B, Covid) Nasopharyngeal Swab     Status: None   Collection Time: 04/27/21  6:07 PM   Specimen: Nasopharyngeal Swab; Nasopharyngeal(NP) swabs in vial transport medium  Result Value Ref Range Status   SARS Coronavirus 2 by RT PCR NEGATIVE NEGATIVE Final    Comment: (NOTE) SARS-CoV-2 target nucleic acids are NOT DETECTED.  The SARS-CoV-2 RNA is generally detectable in upper respiratory specimens during the acute phase of infection. The lowest concentration of SARS-CoV-2 viral copies this assay can detect is 138 copies/mL. A negative result does not preclude SARS-Cov-2 infection and should not be used as the sole basis for treatment or other patient management decisions. A negative result may occur with  improper specimen collection/handling, submission of specimen other than nasopharyngeal swab, presence of viral mutation(s) within the areas targeted by this assay, and inadequate number of viral copies(<138 copies/mL). A negative result must be combined with clinical observations, patient history, and epidemiological information. The expected result is Negative.  Fact Sheet for Patients:  EntrepreneurPulse.com.au  Fact Sheet for Healthcare Providers:  IncredibleEmployment.be  This test is no t yet approved or cleared by the [place] FDA and  has been authorized for detection and/or diagnosis of SARS-CoV-2 by FDA under an Emergency Use Authorization (EUA). This EUA will remain  in effect (meaning this test can be used) for the duration of the COVID-19 declaration under Section 564(b)(1) of the Act, 21 U.S.C.section 360bbb-3(b)(1), unless the authorization is terminated  or revoked sooner.       Influenza A by PCR NEGATIVE NEGATIVE Final   Influenza B by PCR NEGATIVE NEGATIVE Final    Comment: (NOTE) The Xpert Xpress SARS-CoV-2/FLU/RSV plus assay is intended as an  aid in the diagnosis of influenza from Nasopharyngeal swab specimens and should not be used as a sole basis for treatment. Nasal washings and aspirates are unacceptable for Xpert Xpress SARS-CoV-2/FLU/RSV testing.  Fact Sheet for Patients: EntrepreneurPulse.com.au  Fact Sheet for Healthcare Providers: IncredibleEmployment.be  This test is not yet approved or cleared by the [place] FDA and has been authorized for detection and/or diagnosis of SARS-CoV-2 by FDA under an Emergency Use Authorization (EUA). This EUA will remain in effect (meaning this test can be used) for the duration of the COVID-19 declaration under Section 564(b)(1) of the Act, 21 U.S.C. section 360bbb-3(b)(1), unless the authorization is terminated or revoked.  Performed at [facility], [address], [place], [place] [address]   Urine Culture     Status: Abnormal   Collection Time: 04/27/21  6:07 PM   Specimen: Urine, Clean Catch  Result Value Ref  Range Status   Specimen Description   Final    URINE, CLEAN CATCH Performed at [facility], [address] [place], [place] [address]    Special Requests   Final    NONE Performed at [facility], [address] [place], [place] [address]    Culture (A)  Final    >=100,000 COLONIES/mL KLEBSIELLA PNEUMONIAE 50,000 COLONIES/mL PROTEUS MIRABILIS    Report Status 05/01/2021 FINAL  Final   Organism ID, Bacteria KLEBSIELLA PNEUMONIAE (A)  Final   Organism ID, Bacteria PROTEUS MIRABILIS (A)  Final      Susceptibility   Klebsiella pneumoniae - MIC*    AMPICILLIN >=32 RESISTANT Resistant     CEFAZOLIN <=4 SENSITIVE Sensitive     CEFEPIME <=0.12 SENSITIVE Sensitive     CEFTRIAXONE <=0.25 SENSITIVE Sensitive     CIPROFLOXACIN <=0.25 SENSITIVE Sensitive     GENTAMICIN <=1 SENSITIVE Sensitive     IMIPENEM <=0.25 SENSITIVE Sensitive     NITROFURANTOIN 64 INTERMEDIATE Intermediate     TRIMETH/SULFA <=20 SENSITIVE Sensitive      AMPICILLIN/SULBACTAM 4 SENSITIVE Sensitive     PIP/TAZO <=4 SENSITIVE Sensitive     * >=100,000 COLONIES/mL KLEBSIELLA PNEUMONIAE   Proteus mirabilis - MIC*    AMPICILLIN <=2 SENSITIVE Sensitive     CEFAZOLIN <=4 SENSITIVE Sensitive     CEFEPIME <=0.12 SENSITIVE Sensitive     CEFTRIAXONE <=0.25 SENSITIVE Sensitive     CIPROFLOXACIN <=0.25 SENSITIVE Sensitive     GENTAMICIN <=1 SENSITIVE Sensitive     IMIPENEM 2 SENSITIVE Sensitive     NITROFURANTOIN 128 RESISTANT Resistant     TRIMETH/SULFA <=20 SENSITIVE Sensitive     AMPICILLIN/SULBACTAM <=2 SENSITIVE Sensitive     PIP/TAZO <=4 SENSITIVE Sensitive     * 50,000 COLONIES/mL PROTEUS MIRABILIS  MRSA Next Gen by PCR, Nasal     Status: None   Collection Time: 04/27/21  9:31 PM   Specimen: Nasal Mucosa; Nasal Swab  Result Value Ref Range Status   MRSA by PCR Next Gen NOT DETECTED NOT DETECTED Final    Comment: (NOTE) The GeneXpert MRSA Assay (FDA approved for NASAL specimens only), is one component of a comprehensive MRSA colonization surveillance program. It is not intended to diagnose MRSA infection nor to guide or monitor treatment for MRSA infections. Test performance is not FDA approved in patients less than 67 years old. Performed at [facility], [address] [place], [place] [address]   Culture, blood (routine x 2)     Status: Abnormal   Collection Time: 04/28/21  7:49 AM   Specimen: BLOOD  Result Value Ref Range Status   Specimen Description   Final    BLOOD RIGHT PORT Performed at [facility], [address], [place], [place] [address]    Special Requests   Final    BOTTLES DRAWN AEROBIC AND ANAEROBIC Blood Culture adequate volume Performed at [facility], [address] [place], [place] [address]    Culture  Setup Time   Final    GRAM POSITIVE COCCI Gram Stain Report Called to,Read Back By and Verified With: Caraballo,[email or web address]  by [email or web address], b 11.29.22 CRITICAL RESULT CALLED TO, READ BACK BY AND VERIFIED WITH: S  ALSTON,RN@0324  04/29/21 [place] IN BOTH AEROBIC AND ANAEROBIC BOTTLES    Culture (A)  Final    STREPTOCOCCUS MITIS/ORALIS STAPHYLOCOCCUS HOMINIS THE SIGNIFICANCE OF ISOLATING THIS ORGANISM FROM A SINGLE SET OF BLOOD CULTURES WHEN MULTIPLE SETS ARE DRAWN IS UNCERTAIN. PLEASE NOTIFY THE MICROBIOLOGY DEPARTMENT WITHIN ONE WEEK IF  SPECIATION AND SENSITIVITIES ARE REQUIRED. Performed at [facility], [place] [address], [place], [place] [address]    Report Status 05/01/2021 FINAL  Final   Organism ID, Bacteria STREPTOCOCCUS MITIS/ORALIS  Final      Susceptibility   Streptococcus mitis/oralis - MIC*    TETRACYCLINE <=0.25 SENSITIVE Sensitive     VANCOMYCIN 0.5 SENSITIVE Sensitive     CLINDAMYCIN <=0.25 SENSITIVE Sensitive     PENICILLIN Value in next row Sensitive      SENSITIVE0.06    CEFTRIAXONE Value in next row Sensitive      SENSITIVE0.25    * STREPTOCOCCUS MITIS/ORALIS  Blood Culture ID Panel (Reflexed)     Status: Abnormal   Collection Time: 04/28/21  7:49 AM  Result Value Ref Range Status   Enterococcus faecalis NOT DETECTED NOT DETECTED Final   Enterococcus Faecium NOT DETECTED NOT DETECTED Final   Listeria monocytogenes NOT DETECTED NOT DETECTED Final   Staphylococcus species NOT DETECTED NOT DETECTED Final   Staphylococcus aureus (BCID) NOT DETECTED NOT DETECTED Final   Staphylococcus epidermidis NOT DETECTED NOT DETECTED Final   Staphylococcus lugdunensis NOT DETECTED NOT DETECTED Final   Streptococcus species DETECTED (A) NOT DETECTED Final    Comment: Not Enterococcus species, Streptococcus agalactiae, Streptococcus pyogenes, or Streptococcus pneumoniae. CRITICAL RESULT CALLED TO, READ BACK BY AND VERIFIED WITH: S ALSTON,RN@0325  04/29/21 [place]    Streptococcus agalactiae NOT DETECTED NOT DETECTED Final   Streptococcus pneumoniae NOT DETECTED NOT DETECTED Final   Streptococcus pyogenes NOT DETECTED NOT DETECTED Final   A.calcoaceticus-baumannii NOT DETECTED NOT DETECTED Final    Bacteroides fragilis NOT DETECTED NOT DETECTED Final   Enterobacterales NOT DETECTED NOT DETECTED Final   Enterobacter cloacae complex NOT DETECTED NOT DETECTED Final   Escherichia coli NOT DETECTED NOT DETECTED Final   Klebsiella aerogenes NOT DETECTED NOT DETECTED Final   Klebsiella oxytoca NOT DETECTED NOT DETECTED Final   Klebsiella pneumoniae NOT DETECTED NOT DETECTED Final   Proteus species NOT DETECTED NOT DETECTED Final   Salmonella species NOT DETECTED NOT DETECTED Final   Serratia marcescens NOT DETECTED NOT DETECTED Final   Haemophilus influenzae NOT DETECTED NOT DETECTED Final   Neisseria meningitidis NOT DETECTED NOT DETECTED Final   Pseudomonas aeruginosa NOT DETECTED NOT DETECTED Final   Stenotrophomonas maltophilia NOT DETECTED NOT DETECTED Final   Candida albicans NOT DETECTED NOT DETECTED Final   Candida auris NOT DETECTED NOT DETECTED Final   Candida glabrata NOT DETECTED NOT DETECTED Final   Candida krusei NOT DETECTED NOT DETECTED Final   Candida parapsilosis NOT DETECTED NOT DETECTED Final   Candida tropicalis NOT DETECTED NOT DETECTED Final   Cryptococcus neoformans/gattii NOT DETECTED NOT DETECTED Final    Comment: Performed at [facility], [address], [place], [place] [address]  Culture, blood (routine x 2)     Status: Abnormal   Collection Time: 04/28/21  8:34 AM   Specimen: Right Antecubital; Blood  Result Value Ref Range Status   Specimen Description   Final    RIGHT ANTECUBITAL Performed at [facility], [address], [place], [place] [address]    Special Requests   Final    BOTTLES DRAWN AEROBIC AND ANAEROBIC Blood Culture adequate volume Performed at [facility], [address] [place], [place] [address]    Culture  Setup Time   Final    GRAM POSITIVE COCCI anaerobic AND AEROBIC BOTTLEbottle Gram Stain Report Called to,Read Back By and Verified With: Caraballo,[email or web address]  by [email or web address],b 11.29.22 CRITICAL VALUE  NOTED.  VALUE IS CONSISTENT  WITH PREVIOUSLY REPORTED AND CALLED VALUE.    Culture (A)  Final    STREPTOCOCCUS MITIS/ORALIS SUSCEPTIBILITIES PERFORMED ON PREVIOUS CULTURE WITHIN THE LAST 5 DAYS. Performed at [facility], [place] [address] [place], [place] [address]    Report Status 05/01/2021 FINAL  Final     Radiology Studies: ECHOCARDIOGRAM COMPLETE  Result Date: 04/30/2021    ECHOCARDIOGRAM REPORT   Patient Name:   MENNIE SPILLER Date of Exam: 04/30/2021 Medical Rec #:  [id number]       Height:       63.0 in Accession #:    [id number]      Weight:       257.1 lb Date of Birth:  03-29-1951       BSA:          2.151 m Patient Age:    35 years        BP:           112/70 mmHg Patient Gender: F               HR:           84 bpm. Exam Location:  [facility] Procedure: 2D Echo, Cardiac Doppler and Color Doppler Indications:    Bacteremia  History:        Patient has prior history of Echocardiogram examinations, most                 recent 09/20/1942. Signs/Symptoms:Chest Pain; Risk                 Factors:Hypertension and Diabetes. Port-a-cath access only, no                 IV, Breast CA,.  Sonographer:    Wenda Low Referring Phys: [id number] Kamica [place]  Sonographer Comments: Patient is morbidly obese and Technically difficult study due to poor echo windows. IMPRESSIONS  1. Left ventricular ejection fraction, by estimation, is 60 to 65%. The left ventricle has normal function. Left ventricular endocardial border not optimally defined to evaluate regional wall motion. There is moderate left ventricular hypertrophy. Left ventricular diastolic parameters are indeterminate.  2. Right ventricular systolic function is normal. The right ventricular size is mildly enlarged. There is normal pulmonary artery systolic pressure. The estimated right ventricular systolic pressure is 30.1 mmHg.  3. The mitral valve is grossly normal. Trivial mitral valve regurgitation.  4. The aortic valve is tricuspid. Aortic valve regurgitation is not  visualized. Aortic valve sclerosis is present, with no evidence of aortic valve stenosis. Aortic valve mean gradient measures 3.0 mmHg.  5. The inferior vena cava is normal in size with greater than 50% respiratory variability, suggesting right atrial pressure of 3 mmHg. Comparison(s): No significant change from prior study. Prior images reviewed side by side. FINDINGS  Left Ventricle: Left ventricular ejection fraction, by estimation, is 60 to 65%. The left ventricle has normal function. Left ventricular endocardial border not optimally defined to evaluate regional wall motion. The left ventricular internal cavity size was normal in size. There is moderate left ventricular hypertrophy. Left ventricular diastolic parameters are indeterminate. Right Ventricle: The right ventricular size is mildly enlarged. No increase in right ventricular wall thickness. Right ventricular systolic function is normal. There is normal pulmonary artery systolic pressure. The tricuspid regurgitant velocity is 2.64  m/s, and with an assumed right atrial pressure of 3 mmHg, the estimated right ventricular systolic pressure is 60.1 mmHg. Left Atrium: Left  atrial size was normal in size. Right Atrium: Right atrial size was normal in size. Pericardium: There is no evidence of pericardial effusion. Mitral Valve: The mitral valve is grossly normal. Trivial mitral valve regurgitation. MV peak gradient, 3.7 mmHg. The mean mitral valve gradient is 2.0 mmHg. Tricuspid Valve: The tricuspid valve is grossly normal. Tricuspid valve regurgitation is mild. Aortic Valve: The aortic valve is tricuspid. There is mild aortic valve annular calcification. Aortic valve regurgitation is not visualized. Aortic valve sclerosis is present, with no evidence of aortic valve stenosis. Aortic valve mean gradient measures  3.0 mmHg. Aortic valve peak gradient measures 4.9 mmHg. Aortic valve area, by VTI measures 2.32 cm. Pulmonic Valve: The pulmonic valve was grossly  normal. Pulmonic valve regurgitation is trivial. Aorta: The aortic root is normal in size and structure. Venous: The inferior vena cava is normal in size with greater than 50% respiratory variability, suggesting right atrial pressure of 3 mmHg. IAS/Shunts: No atrial level shunt detected by color flow Doppler.  LEFT VENTRICLE PLAX 2D LVIDd:         3.70 cm LVIDs:         2.50 cm LV PW:         1.40 cm LV IVS:        1.30 cm LVOT diam:     2.00 cm LV SV:         61 LV SV Index:   28 LVOT Area:     3.14 cm  RIGHT VENTRICLE RV Basal diam:  3.45 cm RV Mid diam:    3.70 cm RV S prime:     11.70 cm/s LEFT ATRIUM           Index        RIGHT ATRIUM           Index LA diam:      3.00 cm 1.39 cm/m   RA Area:     12.60 cm LA Vol (A4C): 26.8 ml 12.46 ml/m  RA Volume:   31.80 ml  14.78 ml/m  AORTIC VALVE                    PULMONIC VALVE AV Area (Vmax):    2.33 cm     PV Vmax:       0.86 m/s AV Area (Vmean):   2.06 cm     PV Peak grad:  3.0 mmHg AV Area (VTI):     2.32 cm AV Vmax:           111.00 cm/s AV Vmean:          81.400 cm/s AV VTI:            0.263 m AV Peak Grad:      4.9 mmHg AV Mean Grad:      3.0 mmHg LVOT Vmax:         82.20 cm/s LVOT Vmean:        53.400 cm/s LVOT VTI:          0.194 m LVOT/AV VTI ratio: 0.74  AORTA Ao Root diam: 3.10 cm Ao Asc diam:  3.10 cm MITRAL VALVE               TRICUSPID VALVE MV Area (PHT): 5.93 cm    TR Peak grad:   27.9 mmHg MV Area VTI:   2.83 cm    TR Vmax:        264.00 cm/s MV Peak grad:  3.7 mmHg MV  Mean grad:  2.0 mmHg    SHUNTS MV Vmax:       0.96 m/s    Systemic VTI:  0.19 m MV Vmean:      67.4 cm/s   Systemic Diam: 2.00 cm MV Decel Time: 128 msec MV E velocity: 94.30 cm/s MV A velocity: 91.30 cm/s MV E/A ratio:  1.03 Rozann Lesches MD Electronically signed by Rozann Lesches MD Signature Date/Time: 04/30/2021/10:53:15 AM    Final     Scheduled Meds:  Chlorhexidine Gluconate Cloth  6 each Topical Daily   ferrous sulfate  325 mg Oral Q breakfast   folic acid  1 mg  Oral Daily   gabapentin  300 mg Oral TID   insulin aspart  0-5 Units Subcutaneous QHS   insulin aspart  0-9 Units Subcutaneous TID WC   lidocaine  1 patch Transdermal Daily   metoprolol tartrate  25 mg Oral BID   potassium chloride  10 mEq Oral Daily   Ensure Max Protein  11 oz Oral Daily   Continuous Infusions:  cefTRIAXone (ROCEPHIN)  IV 2 g (05/01/21 0843)     LOS: 4 days    Roxan Hockey, MD How to contact the [facility] Attending or Consulting provider [place] or covering provider during after hours [place], for this patient?  Check the care team in [facility] and look for a) attending/consulting [facility] provider listed and b) the [facility] team listed Log into [email or web address] and use [place]'s universal password to access. If you do not have the password, please contact the hospital operator. Locate the [facility] provider you are looking for under [facility] and page to a number that you can be directly reached. If you still have difficulty reaching the provider, please page the [facility] (Director on Call) for the Hospitalists listed on amion for assistance.  05/01/2021, 8:32 PM

## 2021-05-01 NOTE — Progress Notes (Signed)
Physical Therapy Treatment Patient Details Name: Crystal Vega MRN: [id number] DOB: 1950/06/23 Today's Date: 05/01/2021   History of Present Illness Crystal Vega is a 70 y.o. female with medical history significant for breast cancer undergoing chemotherapy, hypertension, diabetes, colon mass, iron deficiency anemia.  Patient was brought to the ED by EMS with reports of left leg pain.  Reports she was getting out of bed 2 weeks ago, she put her legs on the floor and slid down landing on her butt.  She did not hit her head or and knee when she fell.  Since the fall she has not been able to ambulate.  She has not been able to lift her left leg due to pain.  Prior to the fall she has had chronic problems-with pain involving her left lower extremity hip and knees.  She denies any change in stools, reports her stools are black chronically as she is on iron tablets.  No vomiting of blood and no blood in her stools.  Denies palpitations.  No chest pain , no  difficulty breathing.    PT Comments    Patient received pain medication prior to treatment.  Patient presents alert and apprehensive to attempt functional activity due to c/o severe pain in LLE, able to actively complete left ankle pumps without pain, but c/o severe pain with active assisted ROM to left knee and unable to tolerate movement of left hip.  Patient declined to attempt sitting up at bedside.  Patient will benefit from continued skilled physical therapy in hospital and recommended venue below to increase strength, balance, endurance for safe ADLs and gait.    Recommendations for follow up therapy are one component of a multi-disciplinary discharge planning process, led by the attending physician.  Recommendations may be updated based on patient status, additional functional criteria and insurance authorization.  Follow Up Recommendations  Skilled nursing-short term rehab (<3 hours/day)     Assistance Recommended at Discharge  Intermittent Supervision/Assistance  Equipment Recommendations  None recommended by PT    Recommendations for Other Services       Precautions / Restrictions Precautions Precautions: Fall Restrictions Weight Bearing Restrictions: No     Mobility  Bed Mobility               General bed mobility comments: unable to tolerate attempting supine to sitting due to c/o severe LLE pain    Transfers                        Ambulation/Gait                   Stairs             Wheelchair Mobility    Modified Rankin (Stroke Patients Only)       Balance                                            Cognition Arousal/Alertness: Awake/alert Behavior During Therapy: WFL for tasks assessed/performed Overall Cognitive Status: Within Functional Limits for tasks assessed                                          Exercises General Exercises - Lower Extremity Ankle Circles/Pumps: Supine;10 reps;Both;Strengthening;AROM Heel Slides:  AAROM;Left;5 reps    General Comments        Pertinent Vitals/Pain Pain Assessment: Faces Faces Pain Scale: Hurts whole lot Pain Location: left knee and hip with any movement Pain Descriptors / Indicators: Grimacing;Guarding;Sore;Moaning Pain Intervention(s): Limited activity within patient's tolerance;Monitored during session;Repositioned;Premedicated before session    Home Living                          Prior Function            PT Goals (current goals can now be found in the care plan section) Acute Rehab PT Goals Patient Stated Goal: return home after rehab PT Goal Formulation: With patient Time For Goal Achievement: 05/12/21 Potential to Achieve Goals: Fair Progress towards PT goals: Progressing toward goals    Frequency    Min 3X/week      PT Plan Current plan remains appropriate    Co-evaluation              AM-PAC PT "6 Clicks" Mobility    Outcome Measure  Help needed turning from your back to your side while in a flat bed without using bedrails?: A Lot Help needed moving from lying on your back to sitting on the side of a flat bed without using bedrails?: Total Help needed moving to and from a bed to a chair (including a wheelchair)?: Total Help needed standing up from a chair using your arms (e.g., wheelchair or bedside chair)?: Total Help needed to walk in hospital room?: Total Help needed climbing 3-5 steps with a railing? : Total 6 Click Score: 7    End of Session   Activity Tolerance: Patient limited by pain Patient left: in bed;with call bell/phone within reach Nurse Communication: Mobility status PT Visit Diagnosis: Unsteadiness on feet (R26.81);Other abnormalities of gait and mobility (R26.89);Muscle weakness (generalized) (M62.81)     Time: 1103-1594 PT Time Calculation (min) (ACUTE ONLY): 10 min  Charges:  $Therapeutic Exercise: 8-22 mins                     10:57 AM, 05/01/21 Lonell Grandchild, MPT Physical Therapist with [facility] 336 [phone] office [phone] mobile phone

## 2021-05-01 NOTE — TOC Progression Note (Signed)
Transition of Care [facility]) - Progression Note    Patient Details  Name: Crystal Vega MRN: [id number] Date of Birth: 26-Mar-1951  Transition of Care [facility]) CM/SW Contact  Shade Flood, LCSW Phone Number: 05/01/2021, 1:31 PM  Clinical Narrative:     TOC following. At this time, plan remains for transfer to [facility] for short term rehab at dc. Insurance authorization is still pending. MD, Palliative Care, and Chaplain follow pt for support and further decision making.  TOC will continue to follow and assist.  Expected Discharge Plan: [place] Barriers to Discharge: Continued Medical Work up  Expected Discharge Plan and Services Expected Discharge Plan: [place] In-house Referral: Clinical Social Work   Post Acute Care Choice: [place] Living arrangements for the past 2 months: Single Family Home                                       Social Determinants of Health (SDOH) Interventions    Readmission Risk Interventions Readmission Risk Prevention Plan 04/28/2021  Transportation Screening Complete  Home Care Screening Complete  Medication Review (RN CM) Complete  Some recent data might be hidden

## 2021-05-01 NOTE — Plan of Care (Signed)
Pt received 1 unit of blood during overnight. Hgb was 6.2 critical called. Hgb is 7.3 this am. Metoprolol held per Dr. Josephine Cables due to hypotensive prior to blood transfusion.  Problem: Education: Goal: Knowledge of General Education information will improve Description: Including pain rating scale, medication(s)/side effects and non-pharmacologic comfort measures Outcome: Progressing   Problem: Health Behavior/Discharge Planning: Goal: Ability to manage health-related needs will improve Outcome: Progressing   Problem: Clinical Measurements: Goal: Ability to maintain clinical measurements within normal limits will improve Outcome: Progressing Goal: Will remain free from infection Outcome: Progressing Goal: Diagnostic test results will improve Outcome: Progressing Goal: Respiratory complications will improve Outcome: Progressing Goal: Cardiovascular complication will be avoided Outcome: Progressing   Problem: Activity: Goal: Risk for activity intolerance will decrease Outcome: Progressing   Problem: Nutrition: Goal: Adequate nutrition will be maintained Outcome: Progressing   Problem: Coping: Goal: Level of anxiety will decrease Outcome: Progressing   Problem: Elimination: Goal: Will not experience complications related to bowel motility Outcome: Progressing Goal: Will not experience complications related to urinary retention Outcome: Progressing   Problem: Pain Managment: Goal: General experience of comfort will improve Outcome: Progressing   Problem: Safety: Goal: Ability to remain free from injury will improve Outcome: Progressing   Problem: Skin Integrity: Goal: Risk for impaired skin integrity will decrease Outcome: Progressing

## 2021-05-01 NOTE — Progress Notes (Signed)
   Progress Note  Patient Name: Crystal Vega Date of Encounter: 05/01/2021  Primary Cardiologist: Freada Bergeron, MD  Patient remains in sinus rhythm, telemetry reviewed.  She is on Lopressor 25 mg twice daily as before.  Echocardiogram reveals LVEF 60 to 65%, no regional wall motion abnormalities, mildly dilated right ventricle with normal contraction and estimated RVSP.  No major valvular abnormalities.  Signed, Rozann Lesches, MD  05/01/2021, 10:31 AM

## 2021-05-02 DIAGNOSIS — I10 Essential (primary) hypertension: Secondary | ICD-10-CM | POA: Diagnosis not present

## 2021-05-02 DIAGNOSIS — W19XXXD Unspecified fall, subsequent encounter: Secondary | ICD-10-CM | POA: Diagnosis not present

## 2021-05-02 DIAGNOSIS — D649 Anemia, unspecified: Secondary | ICD-10-CM | POA: Diagnosis not present

## 2021-05-02 DIAGNOSIS — I82412 Acute embolism and thrombosis of left femoral vein: Secondary | ICD-10-CM | POA: Diagnosis not present

## 2021-05-02 LAB — TYPE AND SCREEN
ABO/RH(D): O POS
Antibody Screen: NEGATIVE
Unit division: 0
Unit division: 0
Unit division: 0

## 2021-05-02 LAB — GLUCOSE, CAPILLARY
Glucose-Capillary: 121 mg/dL — ABNORMAL HIGH (ref 70–99)
Glucose-Capillary: 139 mg/dL — ABNORMAL HIGH (ref 70–99)
Glucose-Capillary: 143 mg/dL — ABNORMAL HIGH (ref 70–99)
Glucose-Capillary: 161 mg/dL — ABNORMAL HIGH (ref 70–99)

## 2021-05-02 LAB — BPAM RBC
Blood Product Expiration Date: 202301022359
Blood Product Expiration Date: 202301022359
Blood Product Expiration Date: 202301022359
ISSUE DATE / TIME: 202211290027
ISSUE DATE / TIME: 202212012207
Unit Type and Rh: 5100
Unit Type and Rh: 5100
Unit Type and Rh: 5100

## 2021-05-02 LAB — CBC
HCT: 23.4 % — ABNORMAL LOW (ref 36.0–46.0)
Hemoglobin: 7.2 g/dL — ABNORMAL LOW (ref 12.0–15.0)
MCH: 28.6 pg (ref 26.0–34.0)
MCHC: 30.8 g/dL (ref 30.0–36.0)
MCV: 92.9 fL (ref 80.0–100.0)
Platelets: 54 10*3/uL — ABNORMAL LOW (ref 150–400)
RBC: 2.52 MIL/uL — ABNORMAL LOW (ref 3.87–5.11)
RDW: 19 % — ABNORMAL HIGH (ref 11.5–15.5)
WBC: 7.8 10*3/uL (ref 4.0–10.5)
nRBC: 0 % (ref 0.0–0.2)

## 2021-05-02 NOTE — Plan of Care (Signed)
  Problem: Clinical Measurements: Goal: Will remain free from infection Outcome: Not Progressing   Problem: Activity: Goal: Risk for activity intolerance will decrease Outcome: Not Progressing   Problem: Coping: Goal: Level of anxiety will decrease Outcome: Not Progressing   Problem: Safety: Goal: Ability to remain free from injury will improve Outcome: Not Progressing

## 2021-05-02 NOTE — Progress Notes (Signed)
PROGRESS NOTE   Crystal Vega  WEX:[id number] DOB: [birth year] DOA: 04/27/2021 PCP: Lemmie Evens, MD   No chief complaint on file.  Level of care: Telemetry  Brief Admission History:  70 y.o. female with medical history significant for advanced breast cancer undergoing chemotherapy, hypertension, diabetes, colon mass, iron deficiency anemia.  Patient was brought to the ED by EMS with reports of left leg pain.  Reports she was getting out of bed 2 weeks ago, she put her legs on the floor and slid down landing on her butt.  She did not hit her head or and knee when she fell.  Since the fall she has not been able to ambulate.  She has not been able to lift her left leg due to pain.  Prior to the fall she has had chronic problems-with pain involving her left lower extremity hip and knees.  She denies any change in stools, reports her stools are black chronically as she is on iron tablets.  No vomiting of blood and no blood in her stools.  Denies palpitations.  No chest pain, no  difficulty breathing.  She reports she has been lying in bed for the past 2 weeks and her husband brings food for her, but she has not been able to eat well.  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 -Patient is unable to ambulate and complains of severe pain patient is unable to ambulate   -??  Metastatic disease related -Orthopedic consult from Dr. Aline Brochure appreciated he recommends lumbar spine films , -Gabapentin 300 3 times daily  -Lumbar spine x-rays demonstrate DDD and degenerative facet disease without other acute findings -As per Dr. Aline Brochure steroid injections into the joint would not be helpful  -Positive blood cultures -Blood cultures from 04/28/2021 growing strep Mitis/Oralis and Staph Hominis  -Suspect contaminant -Given History of colon cancer there was initially concern for Possible Strep Bovis (Gallolyticus)  -Echo with preserved EF of 60 to 65% with vegetations  Atrial tachycardia - - -Episode of possible SVT-- ?? either AVNRT or atrial tachycardia.   -Cardiology consult appreciated continue metoprolol 25 twice daily as recommended by cardiology team -Echo pending - UTI -urine culture from 04/27/2021 with Klebsiella pneumonia and Proteus mirabilis mostly pansensitive -On IV Rocephin last dose 05/03/2021  Left leg DVT - unfortunately had to temporarily hold apixaban due to thrombocytopenia  -Platelets just over 50 K-suspect chemo and malignancy related thrombocytopenia  -Metastatic left breast cancer - she is being treated with chemotherapy by Dr. Delton Coombes  Type 2 diabetes mellitus  Use Novolog/Humalog Sliding scale insulin with Accu-Cheks/Fingersticks as ordered   Right colon adenocarcinoma -Discussed with Dr. Delton Coombes, patient is not a candidate for surgical on aggressive treatment for her colon malignancy due to  metastatic breast cancer -Patient with episodes of recurrent bloody stool suspect bleeding from colon malignancy  Anemia of neoplastic disease with superimposed acute blood loss anemia from: Malignancy - Transfused 1 unit PRBC on 04/27/21 -Hgb currently greater than 7 posttransfusion  Hyponatremia - asymptomatic  - due to SIADH  associated with malignancy - free water restriction   Morbid Obesity- -Low calorie diet, portion control and increase physical activity when able discussed with patient -Body mass index is 46.2 kg/m.   Social/Ethics--- patient with multiple comorbidities, overall prognosis is poor -Palliative care consult appreciated -Patient is a DNR/DNI -Plan of care discussed with patient's daughters [name] and [name] at bedside  --Stage 2 left and right buttocks pressure injuries present on admission -Wound care as advised  Weakness and deconditioning/ambulatory dysfunction--- PT eval appreciated recommends SNF rehab  DVT prophylaxis:  SCDs Code Status: Full  Family Communication:  Plan of care discussed with patient's daughters [name] and [name] at bedside Disposition: Discharge to SNF rehab when bed available and insurance authorization is available  status is: Inpatient Remains inpatient appropriate because: uncontrolled pain    Consultants:  Palliative care Orthopedics Cardiology curbside consultation with Dr. Delton Coombes  Procedures:  N/a  Antimicrobials:  Ceftriaxone 11/29>> 05/03/21  Subjective: - Resting comfortably,  -Left-sided lower extremity pain with palpation and movement persist -Still unable to roll or get out of bed  Objective: Vitals:   05/01/21 1426 05/01/21 2032 05/02/21 0510 05/02/21 1025  BP: (!) 98/51 (!) 104/59 104/65 (!) 108/98  Pulse: 90 97 100 71  Resp: 20 18 17    Temp: 98 F (36.7 C) 98.4 F (36.9 C) 98.4 F (36.9 C)   TempSrc: Oral Oral Oral   SpO2: 100% 99% 99%   Weight:      Height:        Intake/Output Summary (Last 24 hours) at 05/02/2021 1827 Last data filed at 05/02/2021 1300 Gross per 24 hour  Intake 630 ml  Output 1150 ml  Net -520 ml   Filed Weights   04/28/21 0400 04/28/21 1631 04/30/21 0700  Weight: 115.1 kg 116.6 kg 118.3 kg   Examination:  Physical Exam  Gen:- Awake Alert, in no acute distress morbidly obese HEENT:-  [place].AT, No sclera icterus Neck-Supple Neck,No JVD,.  Lungs-  CTAB , fair air movement bilaterally  CV- S1, S2 normal, RRR, left-sided chest Port-A-Cath in situ Abd-  +ve B.Sounds, Abd Soft, No tenderness,    Extremity-left knee swelling, significant pain with attempt at a patient on range of motion of left lower extremity good pedal pulses  Psych-affect is appropriate, oriented x3 Neuro-generalized weakness, no new focal deficits, no tremors Skin--Stage 2 left and right buttocks pressure injuries     Data Reviewed: I have personally reviewed following labs and imaging studies  CBC: Recent Labs  Lab 04/27/21 1152 04/28/21 0432 04/29/21 0445 04/30/21 0459 04/30/21 1936 05/01/21 0500 05/02/21 0417  WBC 9.1   < > 16.6* 8.0 11.1* 10.5 7.8  NEUTROABS 7.0  --  13.8*  --   --   --   --   HGB 7.3*   < > 7.2* 7.0* 6.2* 7.3* 7.2*  HCT 23.2*   < > 23.7* 22.5* 19.5* 23.7* 23.4*  MCV 90.6   < > 91.9 92.6 90.3 91.5 92.9  PLT 56*   < > 54* 55* 59* 56* 54*   < > = values in this interval not displayed.    Basic Metabolic Panel: Recent Labs  Lab 04/27/21 1152 04/28/21 0432 04/29/21  0445 04/30/21 0459 05/01/21 0500  NA 129* 127* 128* 130* 133*  K 3.8 3.6 3.8 4.2 4.1  CL 94* 95* 97* 98 102  CO2 25 23 22 26 23   GLUCOSE 147* 148* 112* 192* 129*  BUN 16 15 19  24* 23  CREATININE 0.55 0.55 0.57 0.51 0.51  CALCIUM 8.6* 8.3* 8.4* 8.6* 8.2*    GFR: Estimated Creatinine Clearance: 82.6 mL/min (by C-G formula based on SCr of 0.51 mg/dL).  Liver Function Tests: Recent Labs  Lab 04/27/21 1152 04/29/21 0445 05/01/21 0500  AST 26 19 30   ALT 12 9 13   ALKPHOS 103 82 93  BILITOT 0.6 0.6 0.4  PROT 7.2 6.6 5.9*  ALBUMIN 2.3* 2.2* 2.0*    CBG: Recent Labs  Lab 05/01/21 1827 05/01/21 2034 05/02/21 0748 05/02/21 1135 05/02/21 1707  GLUCAP 230* 156* 121* 139* 143*    Recent Results (from the past 240 hour(s))  Resp Panel by RT-PCR (Flu A&B, Covid) Nasopharyngeal Swab     Status:  None   Collection Time: 04/27/21  6:07 PM   Specimen: Nasopharyngeal Swab; Nasopharyngeal(NP) swabs in vial transport medium  Result Value Ref Range Status   SARS Coronavirus 2 by RT PCR NEGATIVE NEGATIVE Final    Comment: (NOTE) SARS-CoV-2 target nucleic acids are NOT DETECTED.  The SARS-CoV-2 RNA is generally detectable in upper respiratory specimens during the acute phase of infection. The lowest concentration of SARS-CoV-2 viral copies this assay can detect is 138 copies/mL. A negative result does not preclude SARS-Cov-2 infection and should not be used as the sole basis for treatment or other patient management decisions. A negative result may occur with  improper specimen collection/handling, submission of specimen other than nasopharyngeal swab, presence of viral mutation(s) within the areas targeted by this assay, and inadequate number of viral copies(<138 copies/mL). A negative result must be combined with clinical observations, patient history, and epidemiological information. The expected result is Negative.  Fact Sheet for Patients:  EntrepreneurPulse.com.au  Fact Sheet for Healthcare Providers:  IncredibleEmployment.be  This test is no t yet approved or cleared by the [place] FDA and  has been authorized for detection and/or diagnosis of SARS-CoV-2 by FDA under an Emergency Use Authorization (EUA). This EUA will remain  in effect (meaning this test can be used) for the duration of the COVID-19 declaration under Section 564(b)(1) of the Act, 21 U.S.C.section 360bbb-3(b)(1), unless the authorization is terminated  or revoked sooner.       Influenza A by PCR NEGATIVE NEGATIVE Final   Influenza B by PCR NEGATIVE NEGATIVE Final    Comment: (NOTE) The Xpert Xpress SARS-CoV-2/FLU/RSV plus assay is intended as an aid in the diagnosis of influenza from Nasopharyngeal swab specimens and should not be used as a sole basis for  treatment. Nasal washings and aspirates are unacceptable for Xpert Xpress SARS-CoV-2/FLU/RSV testing.  Fact Sheet for Patients: EntrepreneurPulse.com.au  Fact Sheet for Healthcare Providers: IncredibleEmployment.be  This test is not yet approved or cleared by the [place] FDA and has been authorized for detection and/or diagnosis of SARS-CoV-2 by FDA under an Emergency Use Authorization (EUA). This EUA will remain in effect (meaning this test can be used) for the duration of the COVID-19 declaration under Section 564(b)(1) of the Act, 21 U.S.C. section 360bbb-3(b)(1), unless the authorization is terminated or revoked.  Performed at [facility], [address], [place], [place] [address]   Urine Culture     Status: Abnormal   Collection Time: 04/27/21  6:07 PM  Specimen: Urine, Clean Catch  Result Value Ref Range Status   Specimen Description   Final    URINE, CLEAN CATCH Performed at [facility], [address], [place], [place] [address]    Special Requests   Final    NONE Performed at [facility], [address] [place], [place] [address]    Culture (A)  Final    >=100,000 COLONIES/mL KLEBSIELLA PNEUMONIAE 50,000 COLONIES/mL PROTEUS MIRABILIS    Report Status 05/01/2021 FINAL  Final   Organism ID, Bacteria KLEBSIELLA PNEUMONIAE (A)  Final   Organism ID, Bacteria PROTEUS MIRABILIS (A)  Final      Susceptibility   Klebsiella pneumoniae - MIC*    AMPICILLIN >=32 RESISTANT Resistant     CEFAZOLIN <=4 SENSITIVE Sensitive     CEFEPIME <=0.12 SENSITIVE Sensitive     CEFTRIAXONE <=0.25 SENSITIVE Sensitive     CIPROFLOXACIN <=0.25 SENSITIVE Sensitive     GENTAMICIN <=1 SENSITIVE Sensitive     IMIPENEM <=0.25 SENSITIVE Sensitive     NITROFURANTOIN 64 INTERMEDIATE Intermediate     TRIMETH/SULFA <=20 SENSITIVE Sensitive     AMPICILLIN/SULBACTAM 4 SENSITIVE Sensitive     PIP/TAZO <=4 SENSITIVE Sensitive     * >=100,000 COLONIES/mL  KLEBSIELLA PNEUMONIAE   Proteus mirabilis - MIC*    AMPICILLIN <=2 SENSITIVE Sensitive     CEFAZOLIN <=4 SENSITIVE Sensitive     CEFEPIME <=0.12 SENSITIVE Sensitive     CEFTRIAXONE <=0.25 SENSITIVE Sensitive     CIPROFLOXACIN <=0.25 SENSITIVE Sensitive     GENTAMICIN <=1 SENSITIVE Sensitive     IMIPENEM 2 SENSITIVE Sensitive     NITROFURANTOIN 128 RESISTANT Resistant     TRIMETH/SULFA <=20 SENSITIVE Sensitive     AMPICILLIN/SULBACTAM <=2 SENSITIVE Sensitive     PIP/TAZO <=4 SENSITIVE Sensitive     * 50,000 COLONIES/mL PROTEUS MIRABILIS  MRSA Next Gen by PCR, Nasal     Status: None   Collection Time: 04/27/21  9:31 PM   Specimen: Nasal Mucosa; Nasal Swab  Result Value Ref Range Status   MRSA by PCR Next Gen NOT DETECTED NOT DETECTED Final    Comment: (NOTE) The GeneXpert MRSA Assay (FDA approved for NASAL specimens only), is one component of a comprehensive MRSA colonization surveillance program. It is not intended to diagnose MRSA infection nor to guide or monitor treatment for MRSA infections. Test performance is not FDA approved in patients less than 13 years old. Performed at [facility], [address], [place], [place] [address]   Culture, blood (routine x 2)     Status: Abnormal   Collection Time: 04/28/21  7:49 AM   Specimen: BLOOD  Result Value Ref Range Status   Specimen Description   Final    BLOOD RIGHT PORT Performed at [facility], [address] [place],  [address]    Special Requests   Final    BOTTLES DRAWN AEROBIC AND ANAEROBIC Blood Culture adequate volume Performed at [facility], [address] [place],  [address]    Culture  Setup Time   Final    GRAM POSITIVE COCCI Gram Stain Report Called to,Read Back By and Verified With: Caraballo,[email or web address]  by [email or web address], b 11.29.22 CRITICAL RESULT CALLED TO, READ BACK BY AND VERIFIED WITH: S ALSTON,RN@0324  04/29/21 [place] IN BOTH AEROBIC AND ANAEROBIC BOTTLES    Culture (A)  Final    STREPTOCOCCUS  MITIS/ORALIS STAPHYLOCOCCUS HOMINIS THE SIGNIFICANCE OF ISOLATING THIS ORGANISM FROM A SINGLE SET OF BLOOD CULTURES WHEN MULTIPLE SETS ARE DRAWN IS UNCERTAIN. PLEASE  NOTIFY THE MICROBIOLOGY DEPARTMENT WITHIN ONE WEEK IF SPECIATION AND SENSITIVITIES ARE REQUIRED. Performed at [facility], [place] [address], [place], [place] [address]    Report Status 05/01/2021 FINAL  Final   Organism ID, Bacteria STREPTOCOCCUS MITIS/ORALIS  Final      Susceptibility   Streptococcus mitis/oralis - MIC*    TETRACYCLINE <=0.25 SENSITIVE Sensitive     VANCOMYCIN 0.5 SENSITIVE Sensitive     CLINDAMYCIN <=0.25 SENSITIVE Sensitive     PENICILLIN Value in next row Sensitive      SENSITIVE0.06    CEFTRIAXONE Value in next row Sensitive      SENSITIVE0.25    * STREPTOCOCCUS MITIS/ORALIS  Blood Culture ID Panel (Reflexed)     Status: Abnormal   Collection Time: 04/28/21  7:49 AM  Result Value Ref Range Status   Enterococcus faecalis NOT DETECTED NOT DETECTED Final   Enterococcus Faecium NOT DETECTED NOT DETECTED Final   Listeria monocytogenes NOT DETECTED NOT DETECTED Final   Staphylococcus species NOT DETECTED NOT DETECTED Final   Staphylococcus aureus (BCID) NOT DETECTED NOT DETECTED Final   Staphylococcus epidermidis NOT DETECTED NOT DETECTED Final   Staphylococcus lugdunensis NOT DETECTED NOT DETECTED Final   Streptococcus species DETECTED (A) NOT DETECTED Final    Comment: Not Enterococcus species, Streptococcus agalactiae, Streptococcus pyogenes, or Streptococcus pneumoniae. CRITICAL RESULT CALLED TO, READ BACK BY AND VERIFIED WITH: S ALSTON,RN@0325  04/29/21 [place]    Streptococcus agalactiae NOT DETECTED NOT DETECTED Final   Streptococcus pneumoniae NOT DETECTED NOT DETECTED Final   Streptococcus pyogenes NOT DETECTED NOT DETECTED Final   A.calcoaceticus-baumannii NOT DETECTED NOT DETECTED Final   Bacteroides fragilis NOT DETECTED NOT DETECTED Final   Enterobacterales NOT DETECTED NOT DETECTED Final    Enterobacter cloacae complex NOT DETECTED NOT DETECTED Final   Escherichia coli NOT DETECTED NOT DETECTED Final   Klebsiella aerogenes NOT DETECTED NOT DETECTED Final   Klebsiella oxytoca NOT DETECTED NOT DETECTED Final   Klebsiella pneumoniae NOT DETECTED NOT DETECTED Final   Proteus species NOT DETECTED NOT DETECTED Final   Salmonella species NOT DETECTED NOT DETECTED Final   Serratia marcescens NOT DETECTED NOT DETECTED Final   Haemophilus influenzae NOT DETECTED NOT DETECTED Final   Neisseria meningitidis NOT DETECTED NOT DETECTED Final   Pseudomonas aeruginosa NOT DETECTED NOT DETECTED Final   Stenotrophomonas maltophilia NOT DETECTED NOT DETECTED Final   Candida albicans NOT DETECTED NOT DETECTED Final   Candida auris NOT DETECTED NOT DETECTED Final   Candida glabrata NOT DETECTED NOT DETECTED Final   Candida krusei NOT DETECTED NOT DETECTED Final   Candida parapsilosis NOT DETECTED NOT DETECTED Final   Candida tropicalis NOT DETECTED NOT DETECTED Final   Cryptococcus neoformans/gattii NOT DETECTED NOT DETECTED Final    Comment: Performed at [facility], [address], [place], [place] [address]  Culture, blood (routine x 2)     Status: Abnormal   Collection Time: 04/28/21  8:34 AM   Specimen: Right Antecubital; Blood  Result Value Ref Range Status   Specimen Description   Final    RIGHT ANTECUBITAL Performed at [facility], [address] [place], [place] [address]    Special Requests   Final    BOTTLES DRAWN AEROBIC AND ANAEROBIC Blood Culture adequate volume Performed at [facility], [address], [place], [place] [address]    Culture  Setup Time   Final    GRAM POSITIVE COCCI anaerobic AND AEROBIC BOTTLEbottle Gram Stain Report Called to,Read Back By and  Verified With: Caraballo,[email or web address]  by [email or web address],b 11.29.22 CRITICAL VALUE NOTED.  VALUE IS CONSISTENT WITH PREVIOUSLY REPORTED AND CALLED VALUE.    Culture (A)  Final    STREPTOCOCCUS  MITIS/ORALIS SUSCEPTIBILITIES PERFORMED ON PREVIOUS CULTURE WITHIN THE LAST 5 DAYS. Performed at [facility], [place] [address], [place], [place] [address]    Report Status 05/01/2021 FINAL  Final     Radiology Studies: No results found.  Scheduled Meds:  Chlorhexidine Gluconate Cloth  6 each Topical Daily   ferrous sulfate  325 mg Oral Q breakfast   folic acid  1 mg Oral Daily   gabapentin  300 mg Oral TID   insulin aspart  0-5 Units Subcutaneous QHS   insulin aspart  0-9 Units Subcutaneous TID WC   lidocaine  1 patch Transdermal Daily   metoprolol tartrate  25 mg Oral BID   potassium chloride  10 mEq Oral Daily   Ensure Max Protein  11 oz Oral Daily   Continuous Infusions:  cefTRIAXone (ROCEPHIN)  IV 2 g (05/02/21 0830)     LOS: 5 days   Roxan Hockey, MD How to contact the [facility] Attending or Consulting provider [place] or covering provider during after hours [place], for this patient?  Check the care team in [facility] and look for a) attending/consulting [facility] provider listed and b) the [facility] team listed Log into [email or web address] and use [place]'s universal password to access. If you do not have the password, please contact the hospital operator. Locate the [facility] provider you are looking for under [facility] and page to a number that you can be directly reached. If you still have difficulty reaching the provider, please page the [facility] (Director on Call) for the Hospitalists listed on amion for assistance.  05/02/2021, 6:27 PM

## 2021-05-03 DIAGNOSIS — C50611 Malignant neoplasm of axillary tail of right female breast: Secondary | ICD-10-CM | POA: Diagnosis not present

## 2021-05-03 DIAGNOSIS — W19XXXD Unspecified fall, subsequent encounter: Secondary | ICD-10-CM | POA: Diagnosis not present

## 2021-05-03 DIAGNOSIS — I10 Essential (primary) hypertension: Secondary | ICD-10-CM | POA: Diagnosis not present

## 2021-05-03 DIAGNOSIS — D649 Anemia, unspecified: Secondary | ICD-10-CM | POA: Diagnosis not present

## 2021-05-03 LAB — CBC
HCT: 23.6 % — ABNORMAL LOW (ref 36.0–46.0)
Hemoglobin: 7.1 g/dL — ABNORMAL LOW (ref 12.0–15.0)
MCH: 28.2 pg (ref 26.0–34.0)
MCHC: 30.1 g/dL (ref 30.0–36.0)
MCV: 93.7 fL (ref 80.0–100.0)
Platelets: 54 10*3/uL — ABNORMAL LOW (ref 150–400)
RBC: 2.52 MIL/uL — ABNORMAL LOW (ref 3.87–5.11)
RDW: 18.9 % — ABNORMAL HIGH (ref 11.5–15.5)
WBC: 7.4 10*3/uL (ref 4.0–10.5)
nRBC: 0 % (ref 0.0–0.2)

## 2021-05-03 LAB — GLUCOSE, CAPILLARY
Glucose-Capillary: 107 mg/dL — ABNORMAL HIGH (ref 70–99)
Glucose-Capillary: 183 mg/dL — ABNORMAL HIGH (ref 70–99)
Glucose-Capillary: 151 mg/dL — ABNORMAL HIGH (ref 70–99)
Glucose-Capillary: 167 mg/dL — ABNORMAL HIGH (ref 70–99)

## 2021-05-03 MED ORDER — FENTANYL 25 MCG/HR TD PT72
1.0000 | MEDICATED_PATCH | TRANSDERMAL | Status: DC
  Administered 2021-05-03: 16:00:00 1 via TRANSDERMAL
  Filled 2021-05-03: qty 1

## 2021-05-03 MED ORDER — HYDROMORPHONE HCL 1 MG/ML IJ SOLN
1.0000 mg | INTRAMUSCULAR | Status: DC | PRN
  Administered 2021-05-03 – 2021-05-06 (×9): 1 mg via INTRAVENOUS
  Filled 2021-05-03 (×8): qty 1

## 2021-05-03 NOTE — Progress Notes (Signed)
PROGRESS NOTE  Crystal Vega  QBH:[id number] DOB: 06/15/1950 DOA: 04/27/2021 PCP: Lemmie Evens, MD   No chief complaint on file.  Level of care: Telemetry  Brief Admission History:  70 y.o. female with medical history significant for advanced breast cancer undergoing chemotherapy, hypertension, diabetes, colon mass, iron deficiency anemia.  Patient was brought to the ED by EMS with reports of left leg pain.  Reports she was getting out of bed 2 weeks ago, she put her legs on the floor and slid down landing on her butt.  She did not hit her head or and knee when she fell.  Since the fall she has not been able to ambulate.  She has not been able to lift her left leg due to pain.  Prior to the fall she has had chronic problems-with pain involving her left lower extremity hip and knees.  She denies any change in stools, reports her stools are black chronically as she is on iron tablets.  No vomiting of blood and no blood in her stools.  Denies palpitations.  No chest pain, no  difficulty breathing.  She reports she has been lying in bed for the past 2 weeks and her husband brings food for her, but she has not been able to eat well.  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 -Patient is unable to ambulate and complains of severe pain patient is unable to ambulate  -Increased pain ---start Topical Duragesic patch and change Iv Dilaudid to 1 mg q 3hr prn -??  Metastatic disease related -Orthopedic consult from Dr. Aline Brochure appreciated he recommends lumbar spine films , -Gabapentin 300 3 times daily  -Lumbar spine x-rays demonstrate DDD and degenerative facet disease without other acute findings -As per Dr. Aline Brochure steroid injections into the joint would not be helpful  -Positive blood cultures -Blood cultures from 04/28/2021 growing strep Mitis/Oralis and Staph Hominis  -Suspect contaminant -Given History of colon cancer there was initially concern for Possible Strep Bovis (Gallolyticus)  -Echo with preserved EF of 60 to 65% with vegetations  Atrial tachycardia - - -Episode of possible SVT-- ?? either AVNRT or atrial tachycardia.   -Cardiology consult appreciated continue metoprolol 25 twice daily as recommended by cardiology team -Echo pending - UTI -urine culture from 04/27/2021 with Klebsiella pneumonia and Proteus mirabilis mostly pansensitive -On IV Rocephin last dose 05/03/2021  Left leg DVT - unfortunately had to temporarily hold apixaban due to thrombocytopenia  -Platelets just over 50 K-suspect chemo and malignancy related thrombocytopenia  -Metastatic left breast cancer --Increased pain ---start Topical Duragesic patch and change Iv Dilaudid to 1 mg q 3hr prn - she is being treated with chemotherapy by Dr. Delton Coombes  Type 2 diabetes mellitus  Use Novolog/Humalog Sliding scale  insulin with Accu-Cheks/Fingersticks as ordered   Right colon adenocarcinoma -Discussed with Dr. Delton Coombes, patient is not a candidate for surgical on aggressive treatment for her colon malignancy due to metastatic breast cancer -Patient with episodes of recurrent bloody stool suspect bleeding from colon malignancy  Anemia of neoplastic disease with  superimposed acute blood loss anemia from Colon Malignancy - Transfused 1 unit PRBC on 04/27/21 -Hgb currently greater than 7 posttransfusion  Hyponatremia - asymptomatic  - due to SIADH associated with malignancy - free water restriction   Morbid Obesity- -Low calorie diet, portion control and increase physical activity when able discussed with patient -Body mass index is 46.2 kg/m.  Social/Ethics--- patient with multiple comorbidities, overall prognosis is poor -Palliative care consult appreciated -Patient is a DNR/DNI -Plan of care discussed with patient's daughters [name] and [name] at bedside  --Stage 2 left and right buttocks pressure injuries present on admission -Wound care as advised  Weakness and deconditioning/ambulatory dysfunction--- PT eval appreciated recommends SNF rehab  DVT prophylaxis:  SCDs Code Status: Full  Family Communication:  Plan of care discussed with patient's daughters [name] and [name] at bedside Disposition: Discharge to SNF rehab when bed available and insurance authorization is available  status is: Inpatient Remains inpatient appropriate because: uncontrolled pain    Consultants:  Palliative care Orthopedics Cardiology curbside consultation with Dr. Delton Coombes  Procedures:  N/a  Antimicrobials:  Ceftriaxone 11/29>> 05/03/21  Subjective:  -Left-sided lower extremity pain with palpation and movement persist -Still unable to roll or get out of bed --Increased pain ---start Topical Duragesic patch and change Iv Dilaudid to 1 mg q 3hr prn  Objective: Vitals:   05/02/21 1435 05/02/21 2313 05/03/21 0500 05/03/21 0637  BP: 114/70 98/60 138/72 130/72  Pulse: 93 98 92   Resp: 18 14    Temp: 98.7 F (37.1 C) 98.9 F (37.2 C) 98.6 F (37 C)   TempSrc: Oral Oral Oral   SpO2: 100% 99% 99%   Weight:      Height:        Intake/Output Summary (Last 24 hours) at 05/03/2021 1334 Last data filed at 05/03/2021 0945 Gross per 24 hour   Intake 720 ml  Output 850 ml  Net -130 ml   Filed Weights   04/28/21 0400 04/28/21 1631 04/30/21 0700  Weight: 115.1 kg 116.6 kg 118.3 kg   Examination:  Physical Exam  Gen:- Awake Alert, in no acute distress morbidly obese HEENT:- Myton.AT, No sclera icterus Neck-Supple Neck,No JVD,.  Lungs-  CTAB , fair air movement bilaterally  CV- S1, S2 normal, RRR, left-sided chest Port-A-Cath in situ Abd-  +ve B.Sounds, Abd Soft, No tenderness,    Extremity-left knee swelling, significant pain with attempt at a patient on range of motion of left lower extremity good pedal pulses  Psych-affect is appropriate, oriented x3 Neuro-generalized weakness, no new focal deficits, no tremors Skin--Stage 2 left and right buttocks pressure injuries     Data Reviewed: I have personally reviewed following labs and imaging studies  CBC: Recent Labs  Lab 04/27/21 1152 04/28/21 0432 04/29/21 0445 04/30/21 0459 04/30/21 1936 05/01/21 0500 05/02/21 0417 05/03/21 0525  WBC 9.1   < > 16.6* 8.0 11.1* 10.5 7.8 7.4  NEUTROABS 7.0  --  13.8*  --   --   --   --   --   HGB 7.3*   < > 7.2* 7.0* 6.2* 7.3* 7.2* 7.1*  HCT 23.2*   < > 23.7* 22.5* 19.5* 23.7* 23.4* 23.6*  MCV  90.6   < > 91.9 92.6 90.3 91.5 92.9 93.7  PLT 56*   < > 54* 55* 59* 56* 54* 54*   < > = values in this interval not displayed.    Basic Metabolic Panel: Recent Labs  Lab 04/27/21 1152 04/28/21 0432 04/29/21 0445 04/30/21 0459 05/01/21 0500  NA 129* 127* 128* 130* 133*  K 3.8 3.6 3.8 4.2 4.1  CL 94* 95* 97* 98 102  CO2 25 23 22 26 23   GLUCOSE 147* 148* 112* 192* 129*  BUN 16 15 19  24* 23  CREATININE 0.55 0.55 0.57 0.51 0.51  CALCIUM 8.6* 8.3* 8.4* 8.6* 8.2*    GFR: Estimated Creatinine Clearance: 82.6 mL/min (by C-G formula based on SCr of 0.51 mg/dL).  Liver Function Tests: Recent Labs  Lab 04/27/21 1152 04/29/21 0445 05/01/21 0500  AST 26 19 30   ALT 12 9 13   ALKPHOS 103 82 93  BILITOT 0.6 0.6 0.4  PROT 7.2 6.6  5.9*  ALBUMIN 2.3* 2.2* 2.0*    CBG: Recent Labs  Lab 05/02/21 1135 05/02/21 1707 05/02/21 2229 05/03/21 0806 05/03/21 1126  GLUCAP 139* 143* 161* 107* 183*    Recent Results (from the past 240 hour(s))  Resp Panel by RT-PCR (Flu A&B, Covid) Nasopharyngeal Swab     Status: None   Collection Time: 04/27/21  6:07 PM   Specimen: Nasopharyngeal Swab; Nasopharyngeal(NP) swabs in vial transport medium  Result Value Ref Range Status   SARS Coronavirus 2 by RT PCR NEGATIVE NEGATIVE Final    Comment: (NOTE) SARS-CoV-2 target nucleic acids are NOT DETECTED.  The SARS-CoV-2 RNA is generally detectable in upper respiratory specimens during the acute phase of infection. The lowest concentration of SARS-CoV-2 viral copies this assay can detect is 138 copies/mL. A negative result does not preclude SARS-Cov-2 infection and should not be used as the sole basis for treatment or other patient management decisions. A negative result may occur with  improper specimen collection/handling, submission of specimen other than nasopharyngeal swab, presence of viral mutation(s) within the areas targeted by this assay, and inadequate number of viral copies(<138 copies/mL). A negative result must be combined with clinical observations, patient history, and epidemiological information. The expected result is Negative.  Fact Sheet for Patients:  EntrepreneurPulse.com.au  Fact Sheet for Healthcare Providers:  IncredibleEmployment.be  This test is no t yet approved or cleared by the [place] FDA and  has been authorized for detection and/or diagnosis of SARS-CoV-2 by FDA under an Emergency Use Authorization (EUA). This EUA will remain  in effect (meaning this test can be used) for the duration of the COVID-19 declaration under Section 564(b)(1) of the Act, 21 U.S.C.section 360bbb-3(b)(1), unless the authorization is terminated  or revoked sooner.        Influenza A by PCR NEGATIVE NEGATIVE Final   Influenza B by PCR NEGATIVE NEGATIVE Final    Comment: (NOTE) The Xpert Xpress SARS-CoV-2/FLU/RSV plus assay is intended as an aid in the diagnosis of influenza from Nasopharyngeal swab specimens and should not be used as a sole basis for treatment. Nasal washings and aspirates are unacceptable for Xpert Xpress SARS-CoV-2/FLU/RSV testing.  Fact Sheet for Patients: EntrepreneurPulse.com.au  Fact Sheet for Healthcare Providers: IncredibleEmployment.be  This test is not yet approved or cleared by the [place] FDA and has been authorized for detection and/or diagnosis of SARS-CoV-2 by FDA under an Emergency Use Authorization (EUA). This EUA will remain in effect (meaning this test can be used) for the duration of  the COVID-19 declaration under Section 564(b)(1) of the Act, 21 U.S.C. section 360bbb-3(b)(1), unless the authorization is terminated or revoked.  Performed at [facility], [address], [place], [place] [address]   Urine Culture     Status: Abnormal   Collection Time: 04/27/21  6:07 PM   Specimen: Urine, Clean Catch  Result Value Ref Range Status   Specimen Description   Final    URINE, CLEAN CATCH Performed at [facility], [address] [place], [place] [address]    Special Requests   Final    NONE Performed at [facility], [address], [place], [place] [address]    Culture (A)  Final    >=100,000 COLONIES/mL KLEBSIELLA PNEUMONIAE 50,000 COLONIES/mL PROTEUS MIRABILIS    Report Status 05/01/2021 FINAL  Final   Organism ID, Bacteria KLEBSIELLA PNEUMONIAE (A)  Final   Organism ID, Bacteria PROTEUS MIRABILIS (A)  Final      Susceptibility   Klebsiella pneumoniae - MIC*    AMPICILLIN >=32 RESISTANT Resistant     CEFAZOLIN <=4 SENSITIVE Sensitive     CEFEPIME <=0.12 SENSITIVE Sensitive     CEFTRIAXONE <=0.25 SENSITIVE Sensitive     CIPROFLOXACIN <=0.25 SENSITIVE Sensitive      GENTAMICIN <=1 SENSITIVE Sensitive     IMIPENEM <=0.25 SENSITIVE Sensitive     NITROFURANTOIN 64 INTERMEDIATE Intermediate     TRIMETH/SULFA <=20 SENSITIVE Sensitive     AMPICILLIN/SULBACTAM 4 SENSITIVE Sensitive     PIP/TAZO <=4 SENSITIVE Sensitive     * >=100,000 COLONIES/mL KLEBSIELLA PNEUMONIAE   Proteus mirabilis - MIC*    AMPICILLIN <=2 SENSITIVE Sensitive     CEFAZOLIN <=4 SENSITIVE Sensitive     CEFEPIME <=0.12 SENSITIVE Sensitive     CEFTRIAXONE <=0.25 SENSITIVE Sensitive     CIPROFLOXACIN <=0.25 SENSITIVE Sensitive     GENTAMICIN <=1 SENSITIVE Sensitive     IMIPENEM 2 SENSITIVE Sensitive     NITROFURANTOIN 128 RESISTANT Resistant     TRIMETH/SULFA <=20 SENSITIVE Sensitive     AMPICILLIN/SULBACTAM <=2 SENSITIVE Sensitive     PIP/TAZO <=4 SENSITIVE Sensitive     * 50,000 COLONIES/mL PROTEUS MIRABILIS  MRSA Next Gen by PCR, Nasal     Status: None   Collection Time: 04/27/21  9:31 PM   Specimen: Nasal Mucosa; Nasal Swab  Result Value Ref Range Status   MRSA by PCR Next Gen NOT DETECTED NOT DETECTED Final    Comment: (NOTE) The GeneXpert MRSA Assay (FDA approved for NASAL specimens only), is one component of a comprehensive MRSA colonization surveillance program. It is not intended to diagnose MRSA infection nor to guide or monitor treatment for MRSA infections. Test performance is not FDA approved in patients less than 44 years old. Performed at [facility], [address], [place], [place] [address]   Culture, blood (routine x 2)     Status: Abnormal   Collection Time: 04/28/21  7:49 AM   Specimen: BLOOD  Result Value Ref Range Status   Specimen Description   Final    BLOOD RIGHT PORT Performed at [facility], [address] [place], [place] [address]    Special Requests   Final    BOTTLES DRAWN AEROBIC AND ANAEROBIC Blood Culture adequate volume Performed at [facility], [address], [place], [place] [address]    Culture  Setup Time   Final    GRAM POSITIVE  COCCI Gram Stain Report Called to,Read Back By and Verified With: Caraballo,[email or web address]  by [email or web address], b 11.29.22 CRITICAL RESULT CALLED  TO, READ BACK BY AND VERIFIED WITH: S ALSTON,RN@0324  04/29/21 [place] IN BOTH AEROBIC AND ANAEROBIC BOTTLES    Culture (A)  Final    STREPTOCOCCUS MITIS/ORALIS STAPHYLOCOCCUS HOMINIS THE SIGNIFICANCE OF ISOLATING THIS ORGANISM FROM A SINGLE SET OF BLOOD CULTURES WHEN MULTIPLE SETS ARE DRAWN IS UNCERTAIN. PLEASE NOTIFY THE MICROBIOLOGY DEPARTMENT WITHIN ONE WEEK IF SPECIATION AND SENSITIVITIES ARE REQUIRED. Performed at [facility], [place] [address], [place], [place] [address]    Report Status 05/01/2021 FINAL  Final   Organism ID, Bacteria STREPTOCOCCUS MITIS/ORALIS  Final      Susceptibility   Streptococcus mitis/oralis - MIC*    TETRACYCLINE <=0.25 SENSITIVE Sensitive     VANCOMYCIN 0.5 SENSITIVE Sensitive     CLINDAMYCIN <=0.25 SENSITIVE Sensitive     PENICILLIN Value in next row Sensitive      SENSITIVE0.06    CEFTRIAXONE Value in next row Sensitive      SENSITIVE0.25    * STREPTOCOCCUS MITIS/ORALIS  Blood Culture ID Panel (Reflexed)     Status: Abnormal   Collection Time: 04/28/21  7:49 AM  Result Value Ref Range Status   Enterococcus faecalis NOT DETECTED NOT DETECTED Final   Enterococcus Faecium NOT DETECTED NOT DETECTED Final   Listeria monocytogenes NOT DETECTED NOT DETECTED Final   Staphylococcus species NOT DETECTED NOT DETECTED Final   Staphylococcus aureus (BCID) NOT DETECTED NOT DETECTED Final   Staphylococcus epidermidis NOT DETECTED NOT DETECTED Final   Staphylococcus lugdunensis NOT DETECTED NOT DETECTED Final   Streptococcus species DETECTED (A) NOT DETECTED Final    Comment: Not Enterococcus species, Streptococcus agalactiae, Streptococcus pyogenes, or Streptococcus pneumoniae. CRITICAL RESULT CALLED TO, READ BACK BY AND VERIFIED WITH: S ALSTON,RN@0325  04/29/21 [place]    Streptococcus agalactiae NOT DETECTED NOT DETECTED Final    Streptococcus pneumoniae NOT DETECTED NOT DETECTED Final   Streptococcus pyogenes NOT DETECTED NOT DETECTED Final   A.calcoaceticus-baumannii NOT DETECTED NOT DETECTED Final   Bacteroides fragilis NOT DETECTED NOT DETECTED Final   Enterobacterales NOT DETECTED NOT DETECTED Final   Enterobacter cloacae complex NOT DETECTED NOT DETECTED Final   Escherichia coli NOT DETECTED NOT DETECTED Final   Klebsiella aerogenes NOT DETECTED NOT DETECTED Final   Klebsiella oxytoca NOT DETECTED NOT DETECTED Final   Klebsiella pneumoniae NOT DETECTED NOT DETECTED Final   Proteus species NOT DETECTED NOT DETECTED Final   Salmonella species NOT DETECTED NOT DETECTED Final   Serratia marcescens NOT DETECTED NOT DETECTED Final   Haemophilus influenzae NOT DETECTED NOT DETECTED Final   Neisseria meningitidis NOT DETECTED NOT DETECTED Final   Pseudomonas aeruginosa NOT DETECTED NOT DETECTED Final   Stenotrophomonas maltophilia NOT DETECTED NOT DETECTED Final   Candida albicans NOT DETECTED NOT DETECTED Final   Candida auris NOT DETECTED NOT DETECTED Final   Candida glabrata NOT DETECTED NOT DETECTED Final   Candida krusei NOT DETECTED NOT DETECTED Final   Candida parapsilosis NOT DETECTED NOT DETECTED Final   Candida tropicalis NOT DETECTED NOT DETECTED Final   Cryptococcus neoformans/gattii NOT DETECTED NOT DETECTED Final    Comment: Performed at [facility], [address], [place], [place] [address]  Culture, blood (routine x 2)     Status: Abnormal   Collection Time: 04/28/21  8:34 AM   Specimen: Right Antecubital; Blood  Result Value Ref Range Status   Specimen Description   Final    RIGHT ANTECUBITAL Performed at [facility], [address] [place], [place] [address]    Special Requests  Final    BOTTLES DRAWN AEROBIC AND ANAEROBIC Blood Culture adequate volume Performed at [facility], [address], [place], [place] [address]    Culture  Setup Time   Final    GRAM POSITIVE COCCI  anaerobic AND AEROBIC BOTTLEbottle Gram Stain Report Called to,Read Back By and Verified With: Caraballo,[email or web address]  by [email or web address],b 11.29.22 CRITICAL VALUE NOTED.  VALUE IS CONSISTENT WITH PREVIOUSLY REPORTED AND CALLED VALUE.    Culture (A)  Final    STREPTOCOCCUS MITIS/ORALIS SUSCEPTIBILITIES PERFORMED ON PREVIOUS CULTURE WITHIN THE LAST 5 DAYS. Performed at [facility], [place] [address] [place], [place] [address]    Report Status 05/01/2021 FINAL  Final     Radiology Studies: No results found.  Scheduled Meds:  Chlorhexidine Gluconate Cloth  6 each Topical Daily   fentaNYL  1 patch Transdermal Q72H   ferrous sulfate  325 mg Oral Q breakfast   folic acid  1 mg Oral Daily   gabapentin  300 mg Oral TID   insulin aspart  0-5 Units Subcutaneous QHS   insulin aspart  0-9 Units Subcutaneous TID WC   lidocaine  1 patch Transdermal Daily   metoprolol tartrate  25 mg Oral BID   potassium chloride  10 mEq Oral Daily   Ensure Max Protein  11 oz Oral Daily   Continuous Infusions:   LOS: 6 days   Roxan Hockey, MD How to contact the [facility] Attending or Consulting provider [place] or covering provider during after hours [place], for this patient?  Check the care team in [facility] and look for a) attending/consulting [facility] provider listed and b) the [facility] team listed Log into [email or web address] and use [place] universal password to access. If you do not have the password, please contact the hospital operator. Locate the [facility] provider you are looking for under [facility] and page to a number that you can be directly reached. If you still have difficulty reaching the provider, please page the [facility] (Director on Call) for the Hospitalists listed on amion for assistance.  05/03/2021, 1:34 PM

## 2021-05-04 DIAGNOSIS — R7881 Bacteremia: Secondary | ICD-10-CM | POA: Diagnosis present

## 2021-05-04 DIAGNOSIS — B955 Unspecified streptococcus as the cause of diseases classified elsewhere: Secondary | ICD-10-CM | POA: Diagnosis present

## 2021-05-04 LAB — BASIC METABOLIC PANEL
Anion gap: 4 — ABNORMAL LOW (ref 5–15)
BUN: 15 mg/dL (ref 8–23)
CO2: 28 mmol/L (ref 22–32)
Calcium: 8.2 mg/dL — ABNORMAL LOW (ref 8.9–10.3)
Chloride: 99 mmol/L (ref 98–111)
Creatinine, Ser: 0.41 mg/dL — ABNORMAL LOW (ref 0.44–1.00)
GFR, Estimated: >60 mL/min (ref >60)
Glucose, Bld: 110 mg/dL — ABNORMAL HIGH (ref 70–99)
Potassium: 4 mmol/L (ref 3.5–5.1)
Sodium: 131 mmol/L — ABNORMAL LOW (ref 135–145)

## 2021-05-04 LAB — CBC
HCT: 22.5 % — ABNORMAL LOW (ref 36.0–46.0)
Hemoglobin: 7 g/dL — ABNORMAL LOW (ref 12.0–15.0)
MCH: 29.5 pg (ref 26.0–34.0)
MCHC: 31.1 g/dL (ref 30.0–36.0)
MCV: 94.9 fL (ref 80.0–100.0)
Platelets: 58 10*3/uL — ABNORMAL LOW (ref 150–400)
RBC: 2.37 MIL/uL — ABNORMAL LOW (ref 3.87–5.11)
RDW: 19.1 % — ABNORMAL HIGH (ref 11.5–15.5)
WBC: 8.6 10*3/uL (ref 4.0–10.5)
nRBC: 0 % (ref 0.0–0.2)

## 2021-05-04 LAB — GLUCOSE, CAPILLARY
Glucose-Capillary: 118 mg/dL — ABNORMAL HIGH (ref 70–99)
Glucose-Capillary: 138 mg/dL — ABNORMAL HIGH (ref 70–99)
Glucose-Capillary: 140 mg/dL — ABNORMAL HIGH (ref 70–99)
Glucose-Capillary: 169 mg/dL — ABNORMAL HIGH (ref 70–99)

## 2021-05-04 MED ORDER — AMOXICILLIN 250 MG PO CAPS
1000.0000 mg | ORAL_CAPSULE | Freq: Three times a day (TID) | ORAL | Status: DC
  Administered 2021-05-04 – 2021-05-06 (×6): 1000 mg via ORAL
  Filled 2021-05-04 (×10): qty 4

## 2021-05-04 NOTE — Progress Notes (Deleted)
RT removed patient from BIPAP and placed on 3L O2, Patient SATs 98%, HR 84 and RR 14. Patient appears to be tolerating well, no complications noted.

## 2021-05-04 NOTE — Care Management Important Message (Signed)
Important Message  Patient Details  Name: Crystal Vega MRN: [id number] Date of Birth: Aug 27, 1950   Medicare Important Message Given:  Yes     Tommy Medal 05/04/2021, 12:50 PM

## 2021-05-04 NOTE — Progress Notes (Signed)
PROGRESS NOTE  Crystal Vega  EYC:[id number] DOB: 12/02/1950 DOA: 04/27/2021 PCP: Lemmie Evens, MD   No chief complaint on file.  Level of care: Telemetry  Brief Admission History:  70 y.o. female with medical history significant for advanced breast cancer undergoing chemotherapy, hypertension, diabetes, colon mass, iron deficiency anemia.  Patient was brought to the ED by EMS with reports of left leg pain.  Reports she was getting out of bed 2 weeks ago, she put her legs on the floor and slid down landing on her butt.  She did not hit her head or and knee when she fell.  Since the fall she has not been able to ambulate.  She has not been able to lift her left leg due to pain.  Prior to the fall she has had chronic problems-with pain involving her left lower extremity hip and knees.  She denies any change in stools, reports her stools are black chronically as she is on iron tablets.  No vomiting of blood and no blood in her stools.  Denies palpitations.  No chest pain, no  difficulty breathing.  She reports she has been lying in bed for the past 2 weeks and her husband brings food for her, but she has not been able to eat well.  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 -Patient is unable to ambulate and complains of severe pain patient is unable to ambulate  -Increased pain ---start Topical Duragesic patch and change Iv Dilaudid to 1 mg q 3hr prn -??  Metastatic disease related -Orthopedic consult from Dr. Aline Brochure appreciated he recommends lumbar spine films , -Gabapentin 300 3 times daily  -Lumbar spine x-rays demonstrate DDD and degenerative facet disease without other acute findings -As per Dr. Aline Brochure steroid injections into the joint would not be helpful    Streptococcal (Strep Oralis/Mitis)  Bacteremia--Pt has PorthAcath -Blood cultures from 04/28/2021 growing strep Mitis/Oralis and Staph Hominis  -Bcx set from 04/28/21 with mitis/oralis and staph Hominis was drawn from PORT,  the 2nd set was peripheral set from 04/28/21 with S mitis/oralis only.   -Treated with ceftriaxone 11/29-12/4 -Discussed with infectious disease specialist Dr. Jule Ser , he recommends Amox 1 Gm TID x 14 days startig 05/04/21 -Echo with preserved EF of 60 to 65% with vegetations   Atrial tachycardia - - -Episode of possible SVT-- ?? either AVNRT or atrial tachycardia.   -Cardiology consult appreciated continue metoprolol 25 twice daily as recommended by cardiology team -Echo with preserved EF and no significant concerns - UTI -urine culture from 04/27/2021 with Klebsiella pneumonia and Proteus mirabilis mostly pansensitive -Treated with ceftriaxone 11/29-12/4  Left leg DVT - unfortunately had  to temporarily hold apixaban due to thrombocytopenia  -Platelets just over 50 K-suspect chemo and malignancy related thrombocytopenia  -Metastatic left breast cancer --Increased pain ---started Topical Duragesic patch and change Iv Dilaudid to 1 mg q 3hr prn - she is being treated with chemotherapy by Dr. Delton Coombes  Type 2 diabetes mellitus  Use Novolog/Humalog Sliding  scale insulin with Accu-Cheks/Fingersticks as ordered   Right colon adenocarcinoma -Discussed with Dr. Delton Coombes, patient is not a candidate for surgical on aggressive treatment for her colon malignancy due to metastatic breast cancer -Patient with episodes of recurrent bloody stool suspect bleeding from colon malignancy  Anemia of neoplastic disease with superimposed acute blood loss anemia from Colon Malignancy - Transfused 1 unit PRBC on 04/27/21 -Hgb currently around 7 posttransfusion  Hyponatremia - asymptomatic  - due to SIADH associated with malignancy - free water restriction   Morbid Obesity- -Low calorie diet, portion control and increase physical activity when able discussed with patient -Body mass index is 46.2 kg/m.  Social/Ethics--- patient with multiple comorbidities, overall prognosis is poor -Palliative care consult appreciated -Patient is a DNR/DNI -Plan of care discussed with patient's daughters [name] and [name] at bedside  --Stage 2 left and right buttocks pressure injuries present on admission -Wound care as advised  Weakness and deconditioning/ambulatory dysfunction--- PT eval appreciated recommends SNF rehab  DVT prophylaxis:  SCDs Code Status: Full  Family Communication:  Plan of care discussed with patient's daughters [name] and [name] at bedside Disposition: Discharge to SNF rehab when bed available and insurance authorization is available  status is: Inpatient Remains inpatient appropriate because: uncontrolled pain    Consultants:  Palliative care Orthopedics Cardiology curbside consultation with Dr. Delton Coombes  Procedures:  N/a  Antimicrobials:  Ceftriaxone 11/29>> 05/03/21 Amox 1 GM TID starting 05/04/21  Subjective:  -Left-sided lower extremity pain with palpation and movement persist -Still unable to roll or get out of bed -No further  Objective: Vitals:   05/03/21 1500 05/03/21 2159 05/04/21 0516 05/04/21 0933  BP: 133/68  132/82  120/64  Pulse: 90 82 90 94  Resp: 16  18   Temp: 98 F (36.7 C)  98 F (36.7 C)   TempSrc: Oral     SpO2: 99%  100%   Weight:      Height:        Intake/Output Summary (Last 24 hours) at 05/04/2021 1213 Last data filed at 05/04/2021 0900 Gross per 24 hour  Intake 480 ml  Output 600 ml  Net -120 ml   Filed Weights   04/28/21 0400 04/28/21 1631 04/30/21 0700  Weight: 115.1 kg 116.6 kg 118.3 kg   Examination:  Physical Exam  Gen:- Awake Alert, in no acute distress morbidly obese HEENT:- [place].AT, No sclera icterus Neck-Supple Neck,No JVD,.  Lungs-  CTAB , fair air movement bilaterally  CV- S1, S2 normal, RRR, left-sided chest Port-A-Cath in situ Abd-  +ve B.Sounds, Abd Soft, No tenderness,    Extremity-left knee swelling, significant pain with attempt at a patient on range of motion of left lower extremity good pedal pulses  Psych-affect is appropriate, oriented x3 Neuro-generalized weakness, no new focal deficits, no tremors Skin--Stage 2 left and right buttocks pressure injuries     Data Reviewed: I have personally reviewed following labs and imaging studies  CBC: Recent Labs  Lab 04/29/21 0445 04/30/21 0459 04/30/21 1936 05/01/21 0500 05/02/21 0417 05/03/21 0525 05/04/21 0429  WBC 16.6*   < > 11.1* 10.5 7.8 7.4 8.6  NEUTROABS 13.8*  --   --   --   --   --   --  HGB 7.2*   < > 6.2* 7.3* 7.2* 7.1* 7.0*  HCT 23.7*   < > 19.5* 23.7* 23.4* 23.6* 22.5*  MCV 91.9   < > 90.3 91.5 92.9 93.7 94.9  PLT 54*   < > 59* 56* 54* 54* 58*   < > = values in this interval not displayed.    Basic Metabolic Panel: Recent Labs  Lab 04/28/21 0432 04/29/21 0445 04/30/21 0459 05/01/21 0500 05/04/21 0429  NA 127* 128* 130* 133* 131*  K 3.6 3.8 4.2 4.1 4.0  CL 95* 97* 98 102 99  CO2 23 22 26 23 28   GLUCOSE 148* 112* 192* 129* 110*  BUN 15 19 24* 23 15  CREATININE 0.55 0.57 0.51 0.51 0.41*  CALCIUM 8.3* 8.4* 8.6* 8.2* 8.2*    GFR: Estimated Creatinine Clearance:  82.6 mL/min (A) (by C-G formula based on SCr of 0.41 mg/dL (L)).  Liver Function Tests: Recent Labs  Lab 04/29/21 0445 05/01/21 0500  AST 19 30  ALT 9 13  ALKPHOS 82 93  BILITOT 0.6 0.4  PROT 6.6 5.9*  ALBUMIN 2.2* 2.0*    CBG: Recent Labs  Lab 05/03/21 1126 05/03/21 1635 05/03/21 2126 05/04/21 0743 05/04/21 1101  GLUCAP 183* 151* 167* 118* 138*    Recent Results (from the past 240 hour(s))  Resp Panel by RT-PCR (Flu A&B, Covid) Nasopharyngeal Swab     Status: None   Collection Time: 04/27/21  6:07 PM   Specimen: Nasopharyngeal Swab; Nasopharyngeal(NP) swabs in vial transport medium  Result Value Ref Range Status   SARS Coronavirus 2 by RT PCR NEGATIVE NEGATIVE Final    Comment: (NOTE) SARS-CoV-2 target nucleic acids are NOT DETECTED.  The SARS-CoV-2 RNA is generally detectable in upper respiratory specimens during the acute phase of infection. The lowest concentration of SARS-CoV-2 viral copies this assay can detect is 138 copies/mL. A negative result does not preclude SARS-Cov-2 infection and should not be used as the sole basis for treatment or other patient management decisions. A negative result may occur with  improper specimen collection/handling, submission of specimen other than nasopharyngeal swab, presence of viral mutation(s) within the areas targeted by this assay, and inadequate number of viral copies(<138 copies/mL). A negative result must be combined with clinical observations, patient history, and epidemiological information. The expected result is Negative.  Fact Sheet for Patients:  EntrepreneurPulse.com.au  Fact Sheet for Healthcare Providers:  IncredibleEmployment.be  This test is no t yet approved or cleared by the [place] FDA and  has been authorized for detection and/or diagnosis of SARS-CoV-2 by FDA under an Emergency Use Authorization (EUA). This EUA will remain  in effect (meaning this test  can be used) for the duration of the COVID-19 declaration under Section 564(b)(1) of the Act, 21 U.S.C.section 360bbb-3(b)(1), unless the authorization is terminated  or revoked sooner.       Influenza A by PCR NEGATIVE NEGATIVE Final   Influenza B by PCR NEGATIVE NEGATIVE Final    Comment: (NOTE) The Xpert Xpress SARS-CoV-2/FLU/RSV plus assay is intended as an aid in the diagnosis of influenza from Nasopharyngeal swab specimens and should not be used as a sole basis for treatment. Nasal washings and aspirates are unacceptable for Xpert Xpress SARS-CoV-2/FLU/RSV testing.  Fact Sheet for Patients: EntrepreneurPulse.com.au  Fact Sheet for Healthcare Providers: IncredibleEmployment.be  This test is not yet approved or cleared by the [place] FDA and has been authorized for detection and/or diagnosis of SARS-CoV-2 by FDA under an Emergency Use Authorization (  EUA). This EUA will remain in effect (meaning this test can be used) for the duration of the COVID-19 declaration under Section 564(b)(1) of the Act, 21 U.S.C. section 360bbb-3(b)(1), unless the authorization is terminated or revoked.  Performed at [facility], [address] [place], [place] [address]   Urine Culture     Status: Abnormal   Collection Time: 04/27/21  6:07 PM   Specimen: Urine, Clean Catch  Result Value Ref Range Status   Specimen Description   Final    URINE, CLEAN CATCH Performed at [facility], [address], [place], [place] [address]    Special Requests   Final    NONE Performed at [facility], [address] [place], [place] [address]    Culture (A)  Final    >=100,000 COLONIES/mL KLEBSIELLA PNEUMONIAE 50,000 COLONIES/mL PROTEUS MIRABILIS    Report Status 05/01/2021 FINAL  Final   Organism ID, Bacteria KLEBSIELLA PNEUMONIAE (A)  Final   Organism ID, Bacteria PROTEUS MIRABILIS (A)  Final      Susceptibility   Klebsiella pneumoniae - MIC*    AMPICILLIN  >=32 RESISTANT Resistant     CEFAZOLIN <=4 SENSITIVE Sensitive     CEFEPIME <=0.12 SENSITIVE Sensitive     CEFTRIAXONE <=0.25 SENSITIVE Sensitive     CIPROFLOXACIN <=0.25 SENSITIVE Sensitive     GENTAMICIN <=1 SENSITIVE Sensitive     IMIPENEM <=0.25 SENSITIVE Sensitive     NITROFURANTOIN 64 INTERMEDIATE Intermediate     TRIMETH/SULFA <=20 SENSITIVE Sensitive     AMPICILLIN/SULBACTAM 4 SENSITIVE Sensitive     PIP/TAZO <=4 SENSITIVE Sensitive     * >=100,000 COLONIES/mL KLEBSIELLA PNEUMONIAE   Proteus mirabilis - MIC*    AMPICILLIN <=2 SENSITIVE Sensitive     CEFAZOLIN <=4 SENSITIVE Sensitive     CEFEPIME <=0.12 SENSITIVE Sensitive     CEFTRIAXONE <=0.25 SENSITIVE Sensitive     CIPROFLOXACIN <=0.25 SENSITIVE Sensitive     GENTAMICIN <=1 SENSITIVE Sensitive     IMIPENEM 2 SENSITIVE Sensitive     NITROFURANTOIN 128 RESISTANT Resistant     TRIMETH/SULFA <=20 SENSITIVE Sensitive     AMPICILLIN/SULBACTAM <=2 SENSITIVE Sensitive     PIP/TAZO <=4 SENSITIVE Sensitive     * 50,000 COLONIES/mL PROTEUS MIRABILIS  MRSA Next Gen by PCR, Nasal     Status: None   Collection Time: 04/27/21  9:31 PM   Specimen: Nasal Mucosa; Nasal Swab  Result Value Ref Range Status   MRSA by PCR Next Gen NOT DETECTED NOT DETECTED Final    Comment: (NOTE) The GeneXpert MRSA Assay (FDA approved for NASAL specimens only), is one component of a comprehensive MRSA colonization surveillance program. It is not intended to diagnose MRSA infection nor to guide or monitor treatment for MRSA infections. Test performance is not FDA approved in patients less than 3 years old. Performed at [facility], [address], [place], [place] [address]   Culture, blood (routine x 2)     Status: Abnormal   Collection Time: 04/28/21  7:49 AM   Specimen: BLOOD  Result Value Ref Range Status   Specimen Description   Final    BLOOD RIGHT PORT Performed at [facility], [address] [place], [place] [address]    Special Requests    Final    BOTTLES DRAWN AEROBIC AND ANAEROBIC Blood Culture adequate volume Performed at [facility], [address], [place],  [address]    Culture  Setup Time   Final    GRAM POSITIVE COCCI Gram  Stain Report Called to,Read Back By and Verified With: Caraballo,[email or web address]  by [email or web address], b 11.29.22 CRITICAL RESULT CALLED TO, READ BACK BY AND VERIFIED WITH: S ALSTON,RN@0324  04/29/21 [place] IN BOTH AEROBIC AND ANAEROBIC BOTTLES    Culture (A)  Final    STREPTOCOCCUS MITIS/ORALIS STAPHYLOCOCCUS HOMINIS THE SIGNIFICANCE OF ISOLATING THIS ORGANISM FROM A SINGLE SET OF BLOOD CULTURES WHEN MULTIPLE SETS ARE DRAWN IS UNCERTAIN. PLEASE NOTIFY THE MICROBIOLOGY DEPARTMENT WITHIN ONE WEEK IF SPECIATION AND SENSITIVITIES ARE REQUIRED. Performed at [facility], [place] [address], [place], [place] [address]    Report Status 05/01/2021 FINAL  Final   Organism ID, Bacteria STREPTOCOCCUS MITIS/ORALIS  Final      Susceptibility   Streptococcus mitis/oralis - MIC*    TETRACYCLINE <=0.25 SENSITIVE Sensitive     VANCOMYCIN 0.5 SENSITIVE Sensitive     CLINDAMYCIN <=0.25 SENSITIVE Sensitive     PENICILLIN Value in next row Sensitive      SENSITIVE0.06    CEFTRIAXONE Value in next row Sensitive      SENSITIVE0.25    * STREPTOCOCCUS MITIS/ORALIS  Blood Culture ID Panel (Reflexed)     Status: Abnormal   Collection Time: 04/28/21  7:49 AM  Result Value Ref Range Status   Enterococcus faecalis NOT DETECTED NOT DETECTED Final   Enterococcus Faecium NOT DETECTED NOT DETECTED Final   Listeria monocytogenes NOT DETECTED NOT DETECTED Final   Staphylococcus species NOT DETECTED NOT DETECTED Final   Staphylococcus aureus (BCID) NOT DETECTED NOT DETECTED Final   Staphylococcus epidermidis NOT DETECTED NOT DETECTED Final   Staphylococcus lugdunensis NOT DETECTED NOT DETECTED Final   Streptococcus species DETECTED (A) NOT DETECTED Final    Comment: Not Enterococcus species, Streptococcus agalactiae, Streptococcus  pyogenes, or Streptococcus pneumoniae. CRITICAL RESULT CALLED TO, READ BACK BY AND VERIFIED WITH: S ALSTON,RN@0325  04/29/21 [place]    Streptococcus agalactiae NOT DETECTED NOT DETECTED Final   Streptococcus pneumoniae NOT DETECTED NOT DETECTED Final   Streptococcus pyogenes NOT DETECTED NOT DETECTED Final   A.calcoaceticus-baumannii NOT DETECTED NOT DETECTED Final   Bacteroides fragilis NOT DETECTED NOT DETECTED Final   Enterobacterales NOT DETECTED NOT DETECTED Final   Enterobacter cloacae complex NOT DETECTED NOT DETECTED Final   Escherichia coli NOT DETECTED NOT DETECTED Final   Klebsiella aerogenes NOT DETECTED NOT DETECTED Final   Klebsiella oxytoca NOT DETECTED NOT DETECTED Final   Klebsiella pneumoniae NOT DETECTED NOT DETECTED Final   Proteus species NOT DETECTED NOT DETECTED Final   Salmonella species NOT DETECTED NOT DETECTED Final   Serratia marcescens NOT DETECTED NOT DETECTED Final   Haemophilus influenzae NOT DETECTED NOT DETECTED Final   Neisseria meningitidis NOT DETECTED NOT DETECTED Final   Pseudomonas aeruginosa NOT DETECTED NOT DETECTED Final   Stenotrophomonas maltophilia NOT DETECTED NOT DETECTED Final   Candida albicans NOT DETECTED NOT DETECTED Final   Candida auris NOT DETECTED NOT DETECTED Final   Candida glabrata NOT DETECTED NOT DETECTED Final   Candida krusei NOT DETECTED NOT DETECTED Final   Candida parapsilosis NOT DETECTED NOT DETECTED Final   Candida tropicalis NOT DETECTED NOT DETECTED Final   Cryptococcus neoformans/gattii NOT DETECTED NOT DETECTED Final    Comment: Performed at [facility], [address] [place], [place] [address]  Culture, blood (routine x 2)     Status: Abnormal   Collection Time: 04/28/21  8:34 AM   Specimen: Right Antecubital; Blood  Result Value Ref Range Status   Specimen Description   Final    RIGHT ANTECUBITAL Performed  at [facility], [address] [place], [place] [address]    Special Requests   Final     BOTTLES DRAWN AEROBIC AND ANAEROBIC Blood Culture adequate volume Performed at [facility], [address], [place], [place] [address]    Culture  Setup Time   Final    GRAM POSITIVE COCCI anaerobic AND AEROBIC BOTTLEbottle Gram Stain Report Called to,Read Back By and Verified With: Caraballo,[email or web address]  by [email or web address],b 11.29.22 CRITICAL VALUE NOTED.  VALUE IS CONSISTENT WITH PREVIOUSLY REPORTED AND CALLED VALUE.    Culture (A)  Final    STREPTOCOCCUS MITIS/ORALIS SUSCEPTIBILITIES PERFORMED ON PREVIOUS CULTURE WITHIN THE LAST 5 DAYS. Performed at [facility], [place] [address] [place],  [address]    Report Status 05/01/2021 FINAL  Final     Radiology Studies: No results found.  Scheduled Meds:  amoxicillin  1,000 mg Oral TID   Chlorhexidine Gluconate Cloth  6 each Topical Daily   fentaNYL  1 patch Transdermal Q72H   ferrous sulfate  325 mg Oral Q breakfast   folic acid  1 mg Oral Daily   gabapentin  300 mg Oral TID   insulin aspart  0-5 Units Subcutaneous QHS   insulin aspart  0-9 Units Subcutaneous TID WC   lidocaine  1 patch Transdermal Daily   metoprolol tartrate  25 mg Oral BID   potassium chloride  10 mEq Oral Daily   Ensure Max Protein  11 oz Oral Daily   Continuous Infusions:   LOS: 7 days   Roxan Hockey, MD How to contact the [facility] Attending or Consulting provider [place] or covering provider during after hours [place], for this patient?  Check the care team in [facility] and look for a) attending/consulting [facility] provider listed and b) the [facility] team listed Log into [email or web address] and use [place]'s universal password to access. If you do not have the password, please contact the hospital operator. Locate the [facility]astroenterology provider you are looking for under [facility] and page to a number that you can be directly reached. If you still have difficulty reaching the provider, please page the [facility] (Director on Call) for the Hospitalists listed on amion for  assistance.  05/04/2021, 12:13 PM

## 2021-05-05 DIAGNOSIS — B955 Unspecified streptococcus as the cause of diseases classified elsewhere: Secondary | ICD-10-CM

## 2021-05-05 DIAGNOSIS — R7881 Bacteremia: Principal | ICD-10-CM

## 2021-05-05 LAB — CBC
HCT: 23.6 % — ABNORMAL LOW (ref 36.0–46.0)
Hemoglobin: 7.3 g/dL — ABNORMAL LOW (ref 12.0–15.0)
MCH: 29.2 pg (ref 26.0–34.0)
MCHC: 30.9 g/dL (ref 30.0–36.0)
MCV: 94.4 fL (ref 80.0–100.0)
Platelets: 70 10*3/uL — ABNORMAL LOW (ref 150–400)
RBC: 2.5 MIL/uL — ABNORMAL LOW (ref 3.87–5.11)
RDW: 19.1 % — ABNORMAL HIGH (ref 11.5–15.5)
WBC: 9.6 10*3/uL (ref 4.0–10.5)
nRBC: 0 % (ref 0.0–0.2)

## 2021-05-05 LAB — GLUCOSE, CAPILLARY
Glucose-Capillary: 124 mg/dL — ABNORMAL HIGH (ref 70–99)
Glucose-Capillary: 117 mg/dL — ABNORMAL HIGH (ref 70–99)
Glucose-Capillary: 165 mg/dL — ABNORMAL HIGH (ref 70–99)
Glucose-Capillary: 164 mg/dL — ABNORMAL HIGH (ref 70–99)
Glucose-Capillary: 119 mg/dL — ABNORMAL HIGH (ref 70–99)

## 2021-05-05 MED ORDER — LIDOCAINE 5 % EX PTCH: 1.0000 | MEDICATED_PATCH | Freq: Every day | CUTANEOUS | 0 refills | Status: AC

## 2021-05-05 MED ORDER — SENNOSIDES-DOCUSATE SODIUM 8.6-50 MG PO TABS: 2.0000 | ORAL_TABLET | Freq: Every day | ORAL | 1 refills | Status: AC

## 2021-05-05 MED ORDER — JUVEN PO PACK
1.0000 | PACK | Freq: Two times a day (BID) | ORAL | Status: DC
  Administered 2021-05-06: 1 via ORAL
  Filled 2021-05-05: qty 1

## 2021-05-05 MED ORDER — HYDROMORPHONE HCL 2 MG PO TABS: 2.0000 mg | ORAL_TABLET | ORAL | 0 refills | Status: DC | PRN

## 2021-05-05 MED ORDER — GABAPENTIN 300 MG PO CAPS
300.0000 mg | ORAL_CAPSULE | Freq: Three times a day (TID) | ORAL | 3 refills | Status: DC
Start: 2021-05-05 — End: 2021-05-25

## 2021-05-05 MED ORDER — GERHARDT'S BUTT CREAM
TOPICAL_CREAM | Freq: Three times a day (TID) | CUTANEOUS | Status: DC
  Filled 2021-05-05 (×2): qty 1

## 2021-05-05 MED ORDER — ENSURE MAX PROTEIN PO LIQD: 11.0000 [oz_av] | Freq: Every day | ORAL | 2 refills | Status: AC

## 2021-05-05 MED ORDER — FENTANYL 50 MCG/HR TD PT72: 1.0000 | MEDICATED_PATCH | TRANSDERMAL | 0 refills | Status: DC

## 2021-05-05 MED ORDER — METOPROLOL SUCCINATE ER 25 MG PO TB24: 25.0000 mg | ORAL_TABLET | Freq: Every day | ORAL | 11 refills | Status: AC

## 2021-05-05 MED ORDER — ONDANSETRON HCL 4 MG PO TABS: 4.0000 mg | ORAL_TABLET | Freq: Every day | ORAL | 1 refills | Status: AC | PRN

## 2021-05-05 MED ORDER — FENTANYL 50 MCG/HR TD PT72
1.0000 | MEDICATED_PATCH | TRANSDERMAL | Status: DC
  Administered 2021-05-05: 1 via TRANSDERMAL
  Filled 2021-05-05: qty 1

## 2021-05-05 MED ORDER — JUVEN PO PACK: 1.0000 | PACK | Freq: Two times a day (BID) | ORAL | 2 refills | Status: AC

## 2021-05-05 MED ORDER — AMOXICILLIN 500 MG PO CAPS: 1000.0000 mg | ORAL_CAPSULE | Freq: Three times a day (TID) | ORAL | 0 refills | Status: DC

## 2021-05-05 MED ORDER — ACETAMINOPHEN 325 MG PO TABS: 650.0000 mg | ORAL_TABLET | Freq: Four times a day (QID) | ORAL | 0 refills | Status: AC | PRN

## 2021-05-05 MED ORDER — GERHARDT'S BUTT CREAM: 1.0000 "application " | TOPICAL_CREAM | Freq: Three times a day (TID) | CUTANEOUS | 1 refills | Status: DC

## 2021-05-05 MED ORDER — POLYETHYLENE GLYCOL 3350 17 G PO PACK: 17.0000 g | PACK | Freq: Two times a day (BID) | ORAL | 2 refills | Status: AC

## 2021-05-05 NOTE — Progress Notes (Signed)
Physical Therapy Treatment Patient Details Name: Crystal Vega MRN: [id number] DOB: 1951/03/28 Today's Date: 05/05/2021   History of Present Illness Crystal Vega is a 70 y.o. female with medical history significant for breast cancer undergoing chemotherapy, hypertension, diabetes, colon mass, iron deficiency anemia.  Patient was brought to the ED by EMS with reports of left leg pain.  Reports she was getting out of bed 2 weeks ago, she put her legs on the floor and slid down landing on her butt.  She did not hit her head or and knee when she fell.  Since the fall she has not been able to ambulate.  She has not been able to lift her left leg due to pain.  Prior to the fall she has had chronic problems-with pain involving her left lower extremity hip and knees.  She denies any change in stools, reports her stools are black chronically as she is on iron tablets.  No vomiting of blood and no blood in her stools.  Denies palpitations.  No chest pain , no  difficulty breathing.    PT Comments    Patient received max pain medication prior to therapy and presents alert and agreeable for therapy.  Patient demonstrates fair/good return for completing BLE ankle pumps, requires active assistance to complete heel slides with LLE mostly due to pain, required frequent rest breaks to recover from LLE pain after movement and demonstrated slow labored movement to move BLE off the side of there bed.  Patient demonstrates good return for helping to pull self to sitting using RUE and tolerated sitting up at bedside with LLE dangling.  Patient unable to attempt sit to stands due to LLE pain and tolerated sitting up at bedside to eat lunch after therapy - nursing staff aware.  Patient will benefit from continued skilled physical therapy in hospital and recommended venue below to increase strength, balance, endurance for safe ADLs and gait.    Recommendations for follow up therapy are one component of a multi-disciplinary  discharge planning process, led by the attending physician.  Recommendations may be updated based on patient status, additional functional criteria and insurance authorization.  Follow Up Recommendations  Skilled nursing-short term rehab (<3 hours/day)     Assistance Recommended at Discharge Intermittent Supervision/Assistance  Equipment Recommendations  None recommended by PT    Recommendations for Other Services       Precautions / Restrictions Precautions Precautions: Fall Restrictions Weight Bearing Restrictions: No     Mobility  Bed Mobility Overal bed mobility: Needs Assistance Bed Mobility: Supine to Sit     Supine to sit: Max assist     General bed mobility comments: demonstrates good return for using RUE to help pull self to sitting, limited mostly due to LLE pain    Transfers                        Ambulation/Gait                   Stairs             Wheelchair Mobility    Modified Rankin (Stroke Patients Only)       Balance Overall balance assessment: Needs assistance Sitting-balance support: Feet supported;No upper extremity supported Sitting balance-Leahy Scale: Fair Sitting balance - Comments: fair/good seated at EOB  Cognition Arousal/Alertness: Awake/alert Behavior During Therapy: WFL for tasks assessed/performed Overall Cognitive Status: Within Functional Limits for tasks assessed                                          Exercises General Exercises - Lower Extremity Ankle Circles/Pumps: Supine;Both;Strengthening;AROM;20 reps Heel Slides: AAROM;10 reps;Both;AROM    General Comments        Pertinent Vitals/Pain Pain Assessment: Faces Faces Pain Scale: Hurts whole lot Pain Location: left knee and hip with any movement Pain Descriptors / Indicators: Grimacing;Guarding;Sore;Moaning;Crying Pain Intervention(s): Limited activity within  patient's tolerance;Monitored during session;Premedicated before session;Repositioned    Home Living                          Prior Function            PT Goals (current goals can now be found in the care plan section) Acute Rehab PT Goals Patient Stated Goal: return home after rehab PT Goal Formulation: With patient Time For Goal Achievement: 05/12/21 Potential to Achieve Goals: Fair Progress towards PT goals: Progressing toward goals    Frequency    Min 3X/week      PT Plan Current plan remains appropriate    Co-evaluation              AM-PAC PT "6 Clicks" Mobility   Outcome Measure  Help needed turning from your back to your side while in a flat bed without using bedrails?: A Lot Help needed moving from lying on your back to sitting on the side of a flat bed without using bedrails?: A Lot Help needed moving to and from a bed to a chair (including a wheelchair)?: Total Help needed standing up from a chair using your arms (e.g., wheelchair or bedside chair)?: Total Help needed to walk in hospital room?: Total Help needed climbing 3-5 steps with a railing? : Total 6 Click Score: 8    End of Session   Activity Tolerance: Patient tolerated treatment well;Patient limited by fatigue;Patient limited by pain Patient left: in bed;with call bell/phone within reach;Other (comment) (seated at EOB) Nurse Communication: Mobility status PT Visit Diagnosis: Unsteadiness on feet (R26.81);Other abnormalities of gait and mobility (R26.89);Muscle weakness (generalized) (M62.81)     Time: 1694-5038 PT Time Calculation (min) (ACUTE ONLY): 44 min  Charges:  $Therapeutic Exercise: 8-22 mins $Therapeutic Activity: 23-37 mins                     12:00 PM, 05/05/21 Lonell Grandchild, MPT Physical Therapist with [facility] [phone] office [phone] mobile phone

## 2021-05-05 NOTE — Progress Notes (Signed)
PROGRESS NOTE  Crystal Vega  FTD:[id number] DOB: [birth year] DOA: 04/27/2021 PCP: Crystal Evens, MD   No chief complaint on file.  Level of care: Telemetry  Brief Admission History:  70 y.o. female with medical history significant for advanced breast cancer undergoing chemotherapy, hypertension, diabetes, colon mass, iron deficiency anemia.  Patient was brought to the ED by EMS with reports of left leg pain.  Reports she was getting out of bed 2 weeks ago, she put her legs on the floor and slid down landing on her butt.  She did not hit her head or and knee when she fell.  Since the fall she has not been able to ambulate.  She has not been able to lift her left leg due to pain.  Prior to the fall she has had chronic problems-with pain involving her left lower extremity hip and knees.  She denies any change in stools, reports her stools are black chronically as she is on iron tablets.  No vomiting of blood and no blood in her stools.  Denies palpitations.  No chest pain, no  difficulty breathing.  She reports she has been lying in bed for the past 2 weeks and her husband brings food for her, but she has not been able to eat well.  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 -Patient is unable to ambulate and complains of severe pain patient is unable to ambulate  -Pain control--increase topical Duragesic patch to 75mcg and c/n Iv Dilaudid to 1 mg q 3hr prn -??  Metastatic disease related -Orthopedic consult from Dr. Aline Vega appreciated he recommends lumbar spine films , -Gabapentin 300 3 times daily  -Lumbar spine x-rays demonstrate DDD and degenerative facet disease without other acute findings -As per Dr. Aline Vega steroid injections into the joint would not be helpful    Streptococcal (Strep Oralis/Mitis)  Bacteremia--Pt has PorthAcath -Blood cultures from 04/28/2021 growing strep Mitis/Oralis and Staph Hominis  -Bcx set from 04/28/21 with mitis/oralis and staph Hominis was drawn from PORT,  the 2nd set was peripheral set from 04/28/21 with S mitis/oralis only.   -Treated with ceftriaxone 11/29-12/4 -Discussed with infectious disease specialist Dr. Jule Vega , he recommends Amox 1 Gm TID x 14 days starting 05/04/21 -Echo with preserved EF of 60 to 65% with vegetations  Atrial tachycardia - - -Episode of possible SVT-- ?? either AVNRT or atrial tachycardia.   -Cardiology consult appreciated continue metoprolol 25 twice daily as recommended by cardiology team -Echo with preserved EF and no significant concerns - UTI -urine culture from 04/27/2021 with Klebsiella pneumonia and Proteus mirabilis mostly pansensitive -Treated with ceftriaxone 11/29-12/4  Left leg DVT - unfortunately had  to temporarily hold apixaban due to thrombocytopenia  -Platelets just over 50 K-suspect chemo and malignancy related thrombocytopenia  -Metastatic left breast cancer -Pain regimen as above #1 - she is being treated with chemotherapy by Dr. Delton Vega  Type 2 diabetes mellitus  --Restart metformin upon discharge Use Novolog/Humalog Sliding scale insulin with  Accu-Cheks/Fingersticks as ordered   Right colon adenocarcinoma -Discussed with Dr. Delton Vega, patient is not a candidate for surgical on aggressive treatment for her colon malignancy due to metastatic breast cancer -Patient with episodes of recurrent bloody stool suspect bleeding from colon malignancy  Anemia of neoplastic disease with superimposed acute blood loss anemia from Colon Malignancy - Transfused 1 unit PRBC on 04/27/21 -Hgb currently around 7 posttransfusion  Hyponatremia - asymptomatic  - due to SIADH associated with malignancy - free water restriction   Morbid Obesity- -Low calorie diet, portion control and increase physical activity when able discussed with patient -Body mass index is 46.2 kg/m.  Social/Ethics--- patient with multiple comorbidities, overall prognosis is poor -Palliative care consult appreciated -Patient is a DNR/DNI -Plan of care discussed with patient's daughters Crystal Vega and Crystal Vega at bedside  --Stage 2 left and right buttocks pressure injuries present on admission -Wound care as advised  Weakness and deconditioning/ambulatory dysfunction--- PT eval appreciated recommends SNF rehab  DVT prophylaxis:  SCDs Code Status: Full  Family Communication:  Plan of care discussed with patient's daughters Crystal Vega and Crystal Vega at bedside Disposition: Discharge to SNF rehab when bed available and insurance authorization is available  --Peer to Peer review with [employer] successful on 05/05/2021 (Dr Crystal Vega) -Hopefully [facility] will accept patient on 05/06/2021 -Discharge med rec completed status is: Inpatient Remains inpatient appropriate because: uncontrolled pain    Consultants:  Palliative care Orthopedics Cardiology curbside consultation with Dr. Delton Vega  Procedures:  N/a  Antimicrobials:  Ceftriaxone 11/29>> 05/03/21 Amox 1 GM TID starting 05/04/21  Subjective:  -Peer to Peer review with [employer] successful on 05/05/2021 (Dr  Crystal Vega) -Hopefully [facility] will accept patient on 05/06/2021 -Discharge med rec completed  -Pain medications adjusted  Objective: Vitals:   05/04/21 0516 05/04/21 0933 05/04/21 1338 05/04/21 1921  BP:  120/64 (!) 118/56 (!) 118/54  Pulse: 90 94 86 96  Resp: 18  18 18   Temp: 98 F (36.7 C)  98.9 F (37.2 C) 98.5 F (36.9 C)  TempSrc:   Oral Oral  SpO2: 100%  100% 100%  Weight:      Height:        Intake/Output Summary (Last 24 hours) at 05/05/2021 1826 Last data filed at 05/05/2021 1330 Gross per 24 hour  Intake 720 ml  Output --  Net 720 ml   Filed Weights   04/28/21 0400 04/28/21 1631 04/30/21 0700  Weight: 115.1 kg 116.6 kg 118.3 kg   Examination:  Physical Exam  Gen:- Awake Alert, in no acute distress morbidly obese HEENT:- [place].AT, No sclera icterus Neck-Supple Neck,No JVD,.  Lungs-  CTAB , fair air movement bilaterally  CV- S1, S2 normal, RRR, left-sided chest Port-A-Cath in situ Abd-  +ve B.Sounds, Abd Soft, No tenderness,    Extremity-left knee swelling, significant pain with attempt at a patient on range of motion of left lower extremity good pedal pulses  Psych-affect is appropriate, oriented x3 Neuro-generalized weakness, no new focal deficits, no tremors Skin--Stage 2 left and right buttocks pressure injuries     Data Reviewed: I have personally reviewed following labs and imaging studies  CBC: Recent Labs  Lab 04/29/21  0630 04/30/21 0459 05/01/21 0500 05/02/21 0417 05/03/21 0525 05/04/21 0429 05/05/21 0935  WBC 16.6*   < > 10.5 7.8 7.4 8.6 9.6  NEUTROABS 13.8*  --   --   --   --   --   --   HGB 7.2*   < > 7.3* 7.2* 7.1* 7.0* 7.3*  HCT 23.7*   < > 23.7* 23.4* 23.6* 22.5* 23.6*  MCV 91.9   < > 91.5 92.9 93.7 94.9 94.4  PLT 54*   < > 56* 54* 54* 58* 70*   < > = values in this interval not displayed.    Basic Metabolic Panel: Recent Labs  Lab 04/29/21 0445 04/30/21 0459 05/01/21 0500 05/04/21 0429  NA 128* 130* 133* 131*  K 3.8  4.2 4.1 4.0  CL 97* 98 102 99  CO2 22 26 23 28   GLUCOSE 112* 192* 129* 110*  BUN 19 24* 23 15  CREATININE 0.57 0.51 0.51 0.41*  CALCIUM 8.4* 8.6* 8.2* 8.2*    GFR: Estimated Creatinine Clearance: 82.6 mL/min (A) (by C-G formula based on SCr of 0.41 mg/dL (L)).  Liver Function Tests: Recent Labs  Lab 04/29/21 0445 05/01/21 0500  AST 19 30  ALT 9 13  ALKPHOS 82 93  BILITOT 0.6 0.4  PROT 6.6 5.9*  ALBUMIN 2.2* 2.0*    CBG: Recent Labs  Lab 05/04/21 2020 05/05/21 0532 05/05/21 0709 05/05/21 1116 05/05/21 1609  GLUCAP 169* 124* 117* 165* 164*    Recent Results (from the past 240 hour(s))  Resp Panel by RT-PCR (Flu A&B, Covid) Nasopharyngeal Swab     Status: None   Collection Time: 04/27/21  6:07 PM   Specimen: Nasopharyngeal Swab; Nasopharyngeal(NP) swabs in vial transport medium  Result Value Ref Range Status   SARS Coronavirus 2 by RT PCR NEGATIVE NEGATIVE Final    Comment: (NOTE) SARS-CoV-2 target nucleic acids are NOT DETECTED.  The SARS-CoV-2 RNA is generally detectable in upper respiratory specimens during the acute phase of infection. The lowest concentration of SARS-CoV-2 viral copies this assay can detect is 138 copies/mL. A negative result does not preclude SARS-Cov-2 infection and should not be used as the sole basis for treatment or other patient management decisions. A negative result may occur with  improper specimen collection/handling, submission of specimen other than nasopharyngeal swab, presence of viral mutation(s) within the areas targeted by this assay, and inadequate number of viral copies(<138 copies/mL). A negative result must be combined with clinical observations, patient history, and epidemiological information. The expected result is Negative.  Fact Sheet for Patients:  EntrepreneurPulse.com.au  Fact Sheet for Healthcare Providers:  IncredibleEmployment.be  This test is no t yet approved or  cleared by the [place] FDA and  has been authorized for detection and/or diagnosis of SARS-CoV-2 by FDA under an Emergency Use Authorization (EUA). This EUA will remain  in effect (meaning this test can be used) for the duration of the COVID-19 declaration under Section 564(b)(1) of the Act, 21 U.S.C.section 360bbb-3(b)(1), unless the authorization is terminated  or revoked sooner.       Influenza A by PCR NEGATIVE NEGATIVE Final   Influenza B by PCR NEGATIVE NEGATIVE Final    Comment: (NOTE) The Xpert Xpress SARS-CoV-2/FLU/RSV plus assay is intended as an aid in the diagnosis of influenza from Nasopharyngeal swab specimens and should not be used as a sole basis for treatment. Nasal washings and aspirates are unacceptable for Xpert Xpress SARS-CoV-2/FLU/RSV testing.  Fact Sheet for Patients: EntrepreneurPulse.com.au  Fact Sheet for Healthcare Providers: IncredibleEmployment.be  This test is not yet approved or cleared by the [place] FDA and has been authorized for detection and/or diagnosis of SARS-CoV-2 by FDA under an Emergency Use Authorization (EUA). This EUA will remain in effect (meaning this test can be used) for the duration of the COVID-19 declaration under Section 564(b)(1) of the Act, 21 U.S.C. section 360bbb-3(b)(1), unless the authorization is terminated or revoked.  Performed at [facility], [address] [place], [place] [address]   Urine Culture     Status: Abnormal   Collection Time: 04/27/21  6:07 PM   Specimen: Urine, Clean Catch  Result Value Ref Range Status   Specimen Description   Final    URINE, CLEAN CATCH Performed at [facility], [address], [place], [place] [address]    Special Requests   Final    NONE Performed at [facility], [address], [place], [place] [address]    Culture (A)  Final    >=100,000 COLONIES/mL KLEBSIELLA PNEUMONIAE 50,000 COLONIES/mL PROTEUS MIRABILIS    Report  Status 05/01/2021 FINAL  Final   Organism ID, Bacteria KLEBSIELLA PNEUMONIAE (A)  Final   Organism ID, Bacteria PROTEUS MIRABILIS (A)  Final      Susceptibility   Klebsiella pneumoniae - MIC*    AMPICILLIN >=32 RESISTANT Resistant     CEFAZOLIN <=4 SENSITIVE Sensitive     CEFEPIME <=0.12 SENSITIVE Sensitive     CEFTRIAXONE <=0.25 SENSITIVE Sensitive     CIPROFLOXACIN <=0.25 SENSITIVE Sensitive     GENTAMICIN <=1 SENSITIVE Sensitive     IMIPENEM <=0.25 SENSITIVE Sensitive     NITROFURANTOIN 64 INTERMEDIATE Intermediate     TRIMETH/SULFA <=20 SENSITIVE Sensitive     AMPICILLIN/SULBACTAM 4 SENSITIVE Sensitive     PIP/TAZO <=4 SENSITIVE Sensitive     * >=100,000 COLONIES/mL KLEBSIELLA PNEUMONIAE   Proteus mirabilis - MIC*    AMPICILLIN <=2 SENSITIVE Sensitive     CEFAZOLIN <=4 SENSITIVE Sensitive     CEFEPIME <=0.12 SENSITIVE Sensitive     CEFTRIAXONE <=0.25 SENSITIVE Sensitive     CIPROFLOXACIN <=0.25 SENSITIVE Sensitive     GENTAMICIN <=1 SENSITIVE Sensitive     IMIPENEM 2 SENSITIVE Sensitive     NITROFURANTOIN 128 RESISTANT Resistant     TRIMETH/SULFA <=20 SENSITIVE Sensitive     AMPICILLIN/SULBACTAM <=2 SENSITIVE Sensitive     PIP/TAZO <=4 SENSITIVE Sensitive     * 50,000 COLONIES/mL PROTEUS MIRABILIS  MRSA Next Gen by PCR, Nasal     Status: None   Collection Time: 04/27/21  9:31 PM   Specimen: Nasal Mucosa; Nasal Swab  Result Value Ref Range Status   MRSA by PCR Next Gen NOT DETECTED NOT DETECTED Final    Comment: (NOTE) The GeneXpert MRSA Assay (FDA approved for NASAL specimens only), is one component of a comprehensive MRSA colonization surveillance program. It is not intended to diagnose MRSA infection nor to guide or monitor treatment for MRSA infections. Test performance is not FDA approved in patients less than 51 years old. Performed at [facility], [address], [place], [place] [address]   Culture, blood (routine x 2)     Status: Abnormal   Collection Time:  04/28/21  7:49 AM   Specimen: BLOOD  Result Value Ref Range Status   Specimen Description   Final    BLOOD RIGHT PORT Performed at [facility], [address], [place],  [address]    Special Requests   Final  BOTTLES DRAWN AEROBIC AND ANAEROBIC Blood Culture adequate volume Performed at [facility], [address], [place], [place] [address]    Culture  Setup Time   Final    GRAM POSITIVE COCCI Gram Stain Report Called to,Read Back By and Verified With: Caraballo,[email or web address]  by [email or web address], b 11.29.22 CRITICAL RESULT CALLED TO, READ BACK BY AND VERIFIED WITH: S ALSTON,[email or web address]@0324  04/29/21 [place] IN BOTH AEROBIC AND ANAEROBIC BOTTLES    Culture (A)  Final    STREPTOCOCCUS MITIS/ORALIS STAPHYLOCOCCUS HOMINIS THE SIGNIFICANCE OF ISOLATING THIS ORGANISM FROM A SINGLE SET OF BLOOD CULTURES WHEN MULTIPLE SETS ARE DRAWN IS UNCERTAIN. PLEASE NOTIFY THE MICROBIOLOGY DEPARTMENT WITHIN ONE WEEK IF SPECIATION AND SENSITIVITIES ARE REQUIRED. Performed at [facility], [place] [address] [place], [place] [address]    Report Status 05/01/2021 FINAL  Final   Organism ID, Bacteria STREPTOCOCCUS MITIS/ORALIS  Final      Susceptibility   Streptococcus mitis/oralis - MIC*    TETRACYCLINE <=0.25 SENSITIVE Sensitive     VANCOMYCIN 0.5 SENSITIVE Sensitive     CLINDAMYCIN <=0.25 SENSITIVE Sensitive     PENICILLIN Value in next row Sensitive      SENSITIVE0.06    CEFTRIAXONE Value in next row Sensitive      SENSITIVE0.25    * STREPTOCOCCUS MITIS/ORALIS  Blood Culture ID Panel (Reflexed)     Status: Abnormal   Collection Time: 04/28/21  7:49 AM  Result Value Ref Range Status   Enterococcus faecalis NOT DETECTED NOT DETECTED Final   Enterococcus Faecium NOT DETECTED NOT DETECTED Final   Listeria monocytogenes NOT DETECTED NOT DETECTED Final   Staphylococcus species NOT DETECTED NOT DETECTED Final   Staphylococcus aureus (BCID) NOT DETECTED NOT DETECTED Final   Staphylococcus epidermidis NOT DETECTED NOT  DETECTED Final   Staphylococcus lugdunensis NOT DETECTED NOT DETECTED Final   Streptococcus species DETECTED (A) NOT DETECTED Final    Comment: Not Enterococcus species, Streptococcus agalactiae, Streptococcus pyogenes, or Streptococcus pneumoniae. CRITICAL RESULT CALLED TO, READ BACK BY AND VERIFIED WITH: S ALSTON,RN@0325  04/29/21 [place]    Streptococcus agalactiae NOT DETECTED NOT DETECTED Final   Streptococcus pneumoniae NOT DETECTED NOT DETECTED Final   Streptococcus pyogenes NOT DETECTED NOT DETECTED Final   A.calcoaceticus-baumannii NOT DETECTED NOT DETECTED Final   Bacteroides fragilis NOT DETECTED NOT DETECTED Final   Enterobacterales NOT DETECTED NOT DETECTED Final   Enterobacter cloacae complex NOT DETECTED NOT DETECTED Final   Escherichia coli NOT DETECTED NOT DETECTED Final   Klebsiella aerogenes NOT DETECTED NOT DETECTED Final   Klebsiella oxytoca NOT DETECTED NOT DETECTED Final   Klebsiella pneumoniae NOT DETECTED NOT DETECTED Final   Proteus species NOT DETECTED NOT DETECTED Final   Salmonella species NOT DETECTED NOT DETECTED Final   Serratia marcescens NOT DETECTED NOT DETECTED Final   Haemophilus influenzae NOT DETECTED NOT DETECTED Final   Neisseria meningitidis NOT DETECTED NOT DETECTED Final   Pseudomonas aeruginosa NOT DETECTED NOT DETECTED Final   Stenotrophomonas maltophilia NOT DETECTED NOT DETECTED Final   Candida albicans NOT DETECTED NOT DETECTED Final   Candida auris NOT DETECTED NOT DETECTED Final   Candida glabrata NOT DETECTED NOT DETECTED Final   Candida krusei NOT DETECTED NOT DETECTED Final   Candida parapsilosis NOT DETECTED NOT DETECTED Final   Candida tropicalis NOT DETECTED NOT DETECTED Final   Cryptococcus neoformans/gattii NOT DETECTED NOT DETECTED Final    Comment: Performed at [facility], [address] [place], [place] [address]  Culture, blood (routine x 2)  Status: Abnormal   Collection Time: 04/28/21  8:34 AM   Specimen: Right  Antecubital; Blood  Result Value Ref Range Status   Specimen Description   Final    RIGHT ANTECUBITAL Performed at [facility], [address] [place], [place] [address]    Special Requests   Final    BOTTLES DRAWN AEROBIC AND ANAEROBIC Blood Culture adequate volume Performed at [facility], [address], [place], [place] [address]    Culture  Setup Time   Final    GRAM POSITIVE COCCI anaerobic AND AEROBIC BOTTLEbottle Gram Stain Report Called to,Read Back By and Verified With: Caraballo,[email or web address]  by [email or web address],b 11.29.22 CRITICAL VALUE NOTED.  VALUE IS CONSISTENT WITH PREVIOUSLY REPORTED AND CALLED VALUE.    Culture (A)  Final    STREPTOCOCCUS MITIS/ORALIS SUSCEPTIBILITIES PERFORMED ON PREVIOUS CULTURE WITHIN THE LAST 5 DAYS. Performed at [facility], [place] [address], [place], [place] [address]    Report Status 05/01/2021 FINAL  Final     Radiology Studies: No results found.  Scheduled Meds:  amoxicillin  1,000 mg Oral TID   Chlorhexidine Gluconate Cloth  6 each Topical Daily   fentaNYL  1 patch Transdermal Q72H   ferrous sulfate  325 mg Oral Q breakfast   folic acid  1 mg Oral Daily   gabapentin  300 mg Oral TID   Gerhardt's butt cream   Topical TID   insulin aspart  0-5 Units Subcutaneous QHS   insulin aspart  0-9 Units Subcutaneous TID WC   lidocaine  1 patch Transdermal Daily   metoprolol tartrate  25 mg Oral BID   [START ON 05/06/2021] nutrition supplement (JUVEN)  1 packet Oral BID BM   potassium chloride  10 mEq Oral Daily   Ensure Max Protein  11 oz Oral Daily   Continuous Infusions:   LOS: 8 days   Roxan Hockey, MD How to contact the [facility] Attending or Consulting provider [place] or covering provider during after hours [facility], for this patient?  Check the care team in [facility] and look for a) attending/consulting [facility] provider listed and b) the [facility] team listed Log into [email or web address] and use [place]'s universal password to access. If you do not have the  password, please contact the hospital operator. Locate the [facility] provider you are looking for under [facility] and page to a number that you can be directly reached. If you still have difficulty reaching the provider, please page the [facility] (Director on Call) for the Hospitalists listed on amion for assistance.  05/05/2021, 6:26 PM

## 2021-05-05 NOTE — Progress Notes (Shared)
[place], [place] [address]   CLINIC:  Medical Oncology/Hematology  PCP:  Lemmie Evens, MD [place]. / [place] [address] [phone]   REASON FOR VISIT:  Follow-up for ***  PRIOR THERAPY: ***  NGS Results: ***  CURRENT THERAPY: ***  BRIEF ONCOLOGIC HISTORY:  Oncology History  Malignant neoplasm of overlapping sites of left breast in female, estrogen receptor positive ([place])  11/16/2019 Cancer Staging   Staging form: Breast, AJCC 8th Edition - Clinical stage from 11/16/2019: Stage IV (cT4b, cN1, cM1, G3, ER+, PR-, HER2-) - Signed by [name], MD on 12/17/2019    11/28/2019 Initial Diagnosis   Patient noted intermittent left breast pain and swelling for several months. Mammogram showed a 6.2cm mass in the left breast axillary tail with an adjacent 0.9cm mass, 2 abnormal left axillary lymph nodes, 1 abnormal right axillary lymph node, and diffuse left breast edema and skin thickening. Biopsy showed IDC, grade 3 in the left breast and bilateral axillas, HER-2 negative (1+), ER+ 100%, PR- 0%, Ki67 30%.    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 STOREY STANGELAND, a 70 y.o. female, returns for routine follow-up and consideration for next cycle of chemotherapy. Elinda was last seen on {XX/XX/XXXX}.  Due for cycle #*** of *** today.   Overall, she tells me she has been feeling pretty well. ***  Overall, she feels ready for next cycle of chemo today.    REVIEW OF SYSTEMS:  Review of Systems - Oncology  PAST MEDICAL/SURGICAL HISTORY:  Past Medical History:  Diagnosis Date   Anemia    Arthritis    per patient " left knee"   DVT (deep venous thrombosis) (HCC)    left leg   Dyspnea    Essential hypertension, benign    Family history of cancer of female genital organ    Family history of GI tract cancer    Metastatic breast cancer ([place])    left breast   Port-A-Cath in place 12/25/2019   Type 2 diabetes mellitus [facility])    Past Surgical History:  Procedure Laterality Date   BIOPSY  11/27/2020   Procedure: BIOPSY;  Surgeon: Daneil Dolin, MD;  Location: [facility];  Service: Endoscopy;;   CATARACT EXTRACTION W/ INTRAOCULAR LENS IMPLANT Right    COLONOSCOPY WITH PROPOFOL N/A 11/27/2020   Procedure: COLONOSCOPY WITH PROPOFOL;  Surgeon: Daneil Dolin, MD;  Location: [facility];  Service: Endoscopy;  Laterality: N/A;  9:00am   HYSTEROSCOPY WITH D & C N/A 06/05/2014   Procedure: DILATATION AND CURETTAGE /HYSTEROSCOPY;  Surgeon: Florian Buff, MD;  Location: [facility] ORS;  Service: Gynecology;  Laterality: N/A;   POLYPECTOMY N/A 06/05/2014   Procedure: ENDOMETRIAL POLYPECTOMY;  Surgeon: Florian Buff, MD;  Location: [facility] OR[facility];  Service: Gynecology;   Laterality: N/A;   PORTACATH PLACEMENT N/A 12/14/2019   Procedure: INSERTION PORT-A-CATH WITH ULTRASOUND GUIDANCE;  Surgeon: Donnie Mesa, MD;  Location: [facility];  Service: General;  Laterality: N/A;   TUBAL LIGATION      SOCIAL HISTORY:  Social History   Socioeconomic History   Marital status: Married    Spouse name: Herbie [place]   Number of children: 2   Years of education: Not on file   Highest education level: Not on file  Occupational History   Occupation: retired  Tobacco Use   Smoking status: Former    Types: Cigarettes   Smokeless tobacco: Never   Tobacco comments:    quit about 40+ years ago  Vaping Use   Vaping Use: Never used  Substance and Sexual Activity   Alcohol use: Never   Drug use: No   Sexual activity: Not on file  Other Topics Concern   Not on file  Social History Narrative   Not on file   Social Determinants of Health   Financial Resource Strain: Not on file  Food Insecurity: Not on file  Transportation Needs: Not on file  Physical Activity: Not on file  Stress: Not on file  Social Connections: Not on file  Intimate Partner Violence: Not on file    FAMILY HISTORY:  Family History  Problem Relation Age of Onset   Diabetes Mellitus II Father    Congestive Heart Failure Father    Colon cancer Mother 38       patient not sure if colon vs stomach   Stroke Maternal Grandmother    Cancer Cousin        female reproductive cancer, dx. 50s/60s    CURRENT MEDICATIONS:  No current facility-administered medications for this visit.   No current outpatient medications on file.   Facility-Administered Medications Ordered in Other Visits  Medication Dose Route Frequency Provider Last Rate Last Admin   acetaminophen (TYLENOL) tablet 650 mg  650 mg Oral Q6H PRN Emokpae, Ejiroghene E, MD   650 mg at 04/28/21 3567   Or   acetaminophen (TYLENOL) suppository 650 mg  650 mg Rectal Q6H PRN Emokpae, Ejiroghene E, MD       amoxicillin (AMOXIL) capsule 1,000 mg   1,000 mg Oral TID Denton Brick, Courage, MD   1,000 mg at 05/04/21 2030   Chlorhexidine Gluconate Cloth 2 % PADS 6 each  6 each Topical Daily Emokpae, Ejiroghene E, MD   6 each at 05/04/21 0938   fentaNYL (DURAGESIC) 25 MCG/HR 1 patch  1 patch Transdermal Q72H Emokpae, Courage, MD   1 patch at 05/03/21 1557   ferrous sulfate tablet 325 mg  325 mg Oral Q breakfast Emokpae, Ejiroghene E, MD   325 mg at 01/41/03 0131   folic acid (FOLVITE) tablet 1 mg  1 mg Oral Daily Emokpae, Ejiroghene E, MD  1 mg at 05/04/21 0938   gabapentin (NEURONTIN) capsule 300 mg  300 mg Oral TID Roxan Hockey, MD   300 mg at 05/04/21 2030   HYDROmorphone (DILAUDID) injection 1 mg  1 mg Intravenous Q3H PRN Roxan Hockey, MD   1 mg at 05/05/21 0549   insulin aspart (novoLOG) injection 0-5 Units  0-5 Units Subcutaneous QHS Emokpae, Ejiroghene E, MD   3 Units at 04/29/21 2100   insulin aspart (novoLOG) injection 0-9 Units  0-9 Units Subcutaneous TID WC Emokpae, Ejiroghene E, MD   1 Units at 05/04/21 1738   lidocaine (LIDODERM) 5 % 1 patch  1 patch Transdermal Daily Pershing Proud, NP   1 patch at 05/04/21 6433   metoprolol tartrate (LOPRESSOR) tablet 25 mg  25 mg Oral BID Freada Bergeron, MD   25 mg at 05/04/21 2030   ondansetron (ZOFRAN) tablet 4 mg  4 mg Oral Q6H PRN Emokpae, Ejiroghene E, MD       Or   ondansetron (ZOFRAN) injection 4 mg  4 mg Intravenous Q6H PRN Emokpae, Ejiroghene E, MD       polyethylene glycol (MIRALAX / GLYCOLAX) packet 17 g  17 g Oral Daily PRN Emokpae, Ejiroghene E, MD       potassium chloride (KLOR-CON) CR tablet 10 mEq  10 mEq Oral Daily Emokpae, Ejiroghene E, MD   10 mEq at 05/04/21 0937   protein supplement (ENSURE MAX) liquid  11 oz Oral Daily Johnson, Clanford L, MD   11 oz at 05/03/21 2951    ALLERGIES:  No Known Allergies  PHYSICAL EXAM:  Performance status (ECOG): {CHL ONC OA:[id number]}  There were no vitals filed for this visit. Wt Readings from Last 3 Encounters:  04/30/21  260 lb 12.9 oz (118.3 kg)  03/31/21 256 lb 6.4 oz (116.3 kg)  03/24/21 264 lb 9.6 oz (120 kg)   Physical Exam  LABORATORY DATA:  I have reviewed the labs as listed.  CBC Latest Ref Rng & Units 05/04/2021 05/03/2021 05/02/2021  WBC 4.0 - 10.5 K/uL 8.6 7.4 7.8  Hemoglobin 12.0 - 15.0 g/dL 7.0(L) 7.1(L) 7.2(L)  Hematocrit 36.0 - 46.0 % 22.5(L) 23.6(L) 23.4(L)  Platelets 150 - 400 K/uL 58(L) 54(L) 54(L)   CMP Latest Ref Rng & Units 05/04/2021 05/01/2021 04/30/2021  Glucose 70 - 99 mg/dL 110(H) 129(H) 192(H)  BUN 8 - 23 mg/dL 15 23 24(H)  Creatinine 0.44 - 1.00 mg/dL 0.41(L) 0.51 0.51  Sodium 135 - 145 mmol/L 131(L) 133(L) 130(L)  Potassium 3.5 - 5.1 mmol/L 4.0 4.1 4.2  Chloride 98 - 111 mmol/L 99 102 98  CO2 22 - 32 mmol/L 28 23 26   Calcium 8.9 - 10.3 mg/dL 8.2(L) 8.2(L) 8.6(L)  Total Protein 6.5 - 8.1 g/dL - 5.9(L) -  Total Bilirubin 0.3 - 1.2 mg/dL - 0.4 -  Alkaline Phos 38 - 126 U/L - 93 -  AST 15 - 41 U/L - 30 -  ALT 0 - 44 U/L - 13 -    DIAGNOSTIC IMAGING:  I have independently reviewed the scans and discussed with the patient. DG Chest 1 View  Result Date: 04/27/2021 CLINICAL DATA:  [place] 2 weeks ago EXAM: CHEST  1 VIEW COMPARISON:  07/28/2020 FINDINGS: Power port on the right with the tip in the right atrium, possibly approaching the tricuspid valve. No evidence of heart failure. Patient has taken a poor inspiration. No traumatic regional finding. IMPRESSION: Poor inspiration. No traumatic finding. Power port tip in the right atrium,  possibly near the tricuspid valve. Electronically Signed   By: Nelson Chimes M.D.   On: 04/27/2021 13:18   DG Lumbar Spine 2-3 Views  Result Date: 04/29/2021 CLINICAL DATA:  Low back and left hip pain EXAM: LUMBAR SPINE - 2-3 VIEW COMPARISON:  CT 04/27/2021 FINDINGS: Limited resolution. Five lumbar type vertebral bodies do not show any visible malalignment. There is degenerative disc disease and degenerative facet disease throughout the lumbar region.  Bilateral sacroiliac degenerated changes noted. Advanced chronic arthropathy of the hips, left worse than right. IMPRESSION: Limited imaging. 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 Bilateral sacroiliac arthritis. Electronically Signed   By: Nelson Chimes M.D.   On: 04/29/2021 09:52   DG Knee 1-2 Views Left  Result Date: 04/27/2021 CLINICAL DATA:  [place] 2 weeks ago.  Pain and limited range of motion. EXAM: LEFT KNEE - 1-2 VIEW COMPARISON:  None. FINDINGS: There is tricompartmental osteoarthritis with a joint effusion. No evidence of fracture or focal bone lesion. IMPRESSION: Osteoarthritis and joint effusion. No traumatic bone finding visible. Electronically Signed   By: Nelson Chimes M.D.   On: 04/27/2021 13:16   CT Angio Chest PE W and/or Wo Contrast  Result Date: 04/27/2021 CLINICAL DATA:  PE suspected, high probability. LEFT leg pain and weakness status post fall 2 weeks ago. History of metastatic LEFT breast cancer, per clinical data provided on previous CT report. EXAM: CT ANGIOGRAPHY CHEST CT ABDOMEN AND PELVIS WITH CONTRAST TECHNIQUE: Multidetector CT imaging of the chest was performed using the standard protocol during bolus administration of intravenous contrast. Multiplanar CT image reconstructions and MIPs were obtained to evaluate the vascular anatomy. Multidetector CT imaging of the abdomen and pelvis was performed using the standard protocol during bolus administration of intravenous contrast. CONTRAST:  16m OMNIPAQUE IOHEXOL 350 MG/ML SOLN COMPARISON:  CT chest abdomen and pelvis dated 04/09/2021. FINDINGS: CTA CHEST FINDINGS Cardiovascular: Some of the most peripheral subsegmental pulmonary arteries are difficult to definitively characterize due to patient breathing motion artifact, however, there is no pulmonary embolism seen within the main, lobar or central segmental pulmonary arteries bilaterally. No thoracic aortic aneurysm or evidence of  aortic dissection. No pericardial effusion. Mediastinum/Nodes: Mediastinal and perihilar lymphadenopathy, including a 1.2 cm short axis lymph node within the anterior mediastinum and a 1.6 cm short axis lymph node within the RIGHT lower paratracheal mediastinum, not significantly changed compared to the recent chest CT of 04/09/2021, compatible with metastatic lymphadenopathy. Additional conglomerate lymphadenopathy is also again seen within the LEFT supraclavicular region, incompletely imaged. z Esophagus is unremarkable. Trachea and central bronchi are unremarkable. Lungs/Pleura: Lungs are clear.  No pleural effusion or pneumothorax. Musculoskeletal: No acute appearing osseous abnormality. Lytic-appearing lesion within the T6 vertebral body is suspicious for osseous metastasis. Additional destructive/lytic changes within the sternum, also suggesting metastatic disease. Conglomerate lymphadenopathy within the LEFT axilla, and mass/lymphadenopathy in the axillary tail region of the LEFT breast measures 4 cm greatest dimension, all of which is not significantly changed compared to the recent chest CT of 04/09/2021. Additional milder lymphadenopathy within the RIGHT axilla is redemonstrated and stable. Review of the MIP images confirms the above findings. CT ABDOMEN and PELVIS FINDINGS Hepatobiliary: Numerous small hypodense lesions within the bilateral liver lobes, as also described on the earlier CT abdomen report of 12/12/2019, presumably numerous small liver metastases. Single stone within the otherwise normal-appearing gallbladder. No bile duct dilatation is seen. Pancreas: Unremarkable. No pancreatic ductal dilatation or surrounding inflammatory changes. Spleen: Normal in size  without focal abnormality. Adrenals/Urinary Tract: Adrenals are unremarkable. Bilateral renal cysts. Kidneys are otherwise unremarkable without suspicious mass, stone or hydronephrosis. No ureteral or bladder calculi are identified.  Bladder is unremarkable. Stomach/Bowel: No dilated large or small bowel loops. Scattered diverticulosis of the descending and sigmoid colon but no focal inflammatory change to suggest acute diverticulitis. No evidence of acute bowel wall inflammation. Stomach is unremarkable. Appendix is normal. Vascular/Lymphatic: Vascular structures of the abdomen and pelvis are unremarkable. Conglomerate lymphadenopathy above the pancreatic head and adjacent to the porta hepatis, as previously described, presumed lymph node metastases. Reproductive: Calcified uterine fibroids. No adnexal mass or free fluid. Other: No free fluid or abscess collection. No free intraperitoneal air. Musculoskeletal: No acute findings. Degenerative spondylosis of the lumbar spine, mild to moderate in degree. 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 Review of the MIP images confirms the above findings. IMPRESSION: 1. No acute findings within the chest. No pulmonary embolism is seen, with mild study limitations detailed above. No evidence of pneumonia or pulmonary edema. 2. Metastatic lymphadenopathy is redemonstrated within the mediastinum, bilateral axillae, and supraclavicular LEFT neck. Also redemonstrated is the mass versus conglomerate lymphadenopathy in the axillary tail region of the LEFT breast. These findings are stable compared to the recent chest CT of 04/09/2021. 3. Lytic-appearing lesion within the T6 vertebral body, and destructive/lytic changes within the sternum, suspicious for metastatic osseous disease. Consider nuclear medicine bone scan or PET scan for confirmation. 4. Numerous small hypodense lesions within the bilateral liver lobes, as also described on the earlier CT abdomen report of 12/12/2019, presumably liver metastases. 5. Cholelithiasis without evidence of acute cholecystitis. 6. Colonic diverticulosis without evidence of acute diverticulitis. 7. No acute findings within the abdomen or pelvis. No bowel obstruction or evidence of acute  bowel wall inflammation. No free fluid or abscess collection. No evidence of acute solid organ abnormality. 8. No evidence of acute osseous fracture or dislocation is seen. Electronically Signed   By: Franki Cabot M.D.   On: 04/27/2021 17:09   CT Knee Left Wo Contrast  Result Date: 04/27/2021 CLINICAL DATA:  Fall 2 weeks ago comment limited range of motion EXAM: CT OF THE LEFT KNEE WITHOUT CONTRAST TECHNIQUE: Multidetector CT imaging of the left knee was performed according to the standard protocol. Multiplanar CT image reconstructions were also generated. COMPARISON:  Radiographs 04/27/2021 FINDINGS: Bones/Joint/Cartilage The knee was imaged in a moderately flexed orientation. There is marked proliferative spurring all 3 compartments with associated cortical irregularity which can reduce sensitivity for subtle fractures. I do not see a well-defined cortical discontinuity to indicate fracture. There is a moderate knee effusion in the suprapatellar bursa. Ligaments Suboptimally assessed by CT. Muscles and Tendons Mild regional muscular atrophy diffusely. Soft tissues Atherosclerosis. Subcutaneous edema anterior to the patella, cannot exclude prepatellar bruising. IMPRESSION: 1. 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 No fracture is identified, although sensitivity for subtle fractures is reduced due to the degree of spurring and associated cortical irregularities. 2. Moderate knee effusion. 3. Atherosclerosis. 4. Subcutaneous edema anterior to the patella. Electronically Signed   By: Van Clines M.D.   On: 04/27/2021 17:15   CT ABDOMEN PELVIS W CONTRAST  Result Date: 04/27/2021 CLINICAL DATA:  PE suspected, high probability. LEFT leg pain and weakness status post fall 2 weeks ago. History of metastatic LEFT breast cancer, per clinical data provided on previous CT report. EXAM: CT ANGIOGRAPHY CHEST CT ABDOMEN AND PELVIS WITH CONTRAST TECHNIQUE: Multidetector CT imaging of the  chest was performed  using the standard protocol during bolus administration of intravenous contrast. Multiplanar CT image reconstructions and MIPs were obtained to evaluate the vascular anatomy. Multidetector CT imaging of the abdomen and pelvis was performed using the standard protocol during bolus administration of intravenous contrast. CONTRAST:  167m OMNIPAQUE IOHEXOL 350 MG/ML SOLN COMPARISON:  CT chest abdomen and pelvis dated 04/09/2021. FINDINGS: CTA CHEST FINDINGS Cardiovascular: Some of the most peripheral subsegmental pulmonary arteries are difficult to definitively characterize due to patient breathing motion artifact, however, there is no pulmonary embolism seen within the main, lobar or central segmental pulmonary arteries bilaterally. No thoracic aortic aneurysm or evidence of aortic dissection. No pericardial effusion. Mediastinum/Nodes: Mediastinal and perihilar lymphadenopathy, including a 1.2 cm short axis lymph node within the anterior mediastinum and a 1.6 cm short axis lymph node within the RIGHT lower paratracheal mediastinum, not significantly changed compared to the recent chest CT of 04/09/2021, compatible with metastatic lymphadenopathy. Additional conglomerate lymphadenopathy is also again seen within the LEFT supraclavicular region, incompletely imaged. z Esophagus is unremarkable. Trachea and central bronchi are unremarkable. Lungs/Pleura: Lungs are clear.  No pleural effusion or pneumothorax. Musculoskeletal: No acute appearing osseous abnormality. Lytic-appearing lesion within the T6 vertebral body is suspicious for osseous metastasis. Additional destructive/lytic changes within the sternum, also suggesting metastatic disease. Conglomerate lymphadenopathy within the LEFT axilla, and mass/lymphadenopathy in the axillary tail region of the LEFT breast measures 4 cm greatest dimension, all of which is not significantly changed compared to the recent chest CT of 04/09/2021. Additional  milder lymphadenopathy within the RIGHT axilla is redemonstrated and stable. Review of the MIP images confirms the above findings. CT ABDOMEN and PELVIS FINDINGS Hepatobiliary: Numerous small hypodense lesions within the bilateral liver lobes, as also described on the earlier CT abdomen report of 12/12/2019, presumably numerous small liver metastases. Single stone within the otherwise normal-appearing gallbladder. No bile duct dilatation is seen. Pancreas: Unremarkable. No pancreatic ductal dilatation or surrounding inflammatory changes. Spleen: Normal in size without focal abnormality. Adrenals/Urinary Tract: Adrenals are unremarkable. Bilateral renal cysts. Kidneys are otherwise unremarkable without suspicious mass, stone or hydronephrosis. No ureteral or bladder calculi are identified. Bladder is unremarkable. Stomach/Bowel: No dilated large or small bowel loops. Scattered diverticulosis of the descending and sigmoid colon but no focal inflammatory change to suggest acute diverticulitis. No evidence of acute bowel wall inflammation. Stomach is unremarkable. Appendix is normal. Vascular/Lymphatic: Vascular structures of the abdomen and pelvis are unremarkable. Conglomerate lymphadenopathy above the pancreatic head and adjacent to the porta hepatis, as previously described, presumed lymph node metastases. Reproductive: Calcified uterine fibroids. No adnexal mass or free fluid. Other: No free fluid or abscess collection. No free intraperitoneal air. Musculoskeletal: No acute findings. Degenerative spondylosis of the lumbar spine, mild to moderate in degree. 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 Review of the MIP images confirms the above findings. IMPRESSION: 1. No acute findings within the chest. No pulmonary embolism is seen, with mild study limitations detailed above. No evidence of pneumonia or pulmonary edema. 2. Metastatic lymphadenopathy is redemonstrated within the mediastinum, bilateral axillae, and supraclavicular  LEFT neck. Also redemonstrated is the mass versus conglomerate lymphadenopathy in the axillary tail region of the LEFT breast. These findings are stable compared to the recent chest CT of 04/09/2021. 3. Lytic-appearing lesion within the T6 vertebral body, and destructive/lytic changes within the sternum, suspicious for metastatic osseous disease. Consider nuclear medicine bone scan or PET scan for confirmation. 4. Numerous small hypodense lesions within the bilateral liver lobes, as also described on the  earlier CT abdomen report of 12/12/2019, presumably liver metastases. 5. Cholelithiasis without evidence of acute cholecystitis. 6. Colonic diverticulosis without evidence of acute diverticulitis. 7. No acute findings within the abdomen or pelvis. No bowel obstruction or evidence of acute bowel wall inflammation. No free fluid or abscess collection. No evidence of acute solid organ abnormality. 8. No evidence of acute osseous fracture or dislocation is seen. Electronically Signed   By: Franki Cabot M.D.   On: 04/27/2021 17:09   CT Hip Left Wo Contrast  Result Date: 04/27/2021 CLINICAL DATA:  Hip trauma, fracture suspected. EXAM: CT OF THE LEFT HIP WITHOUT CONTRAST TECHNIQUE: Multidetector CT imaging of the left hip was performed according to the standard protocol. Multiplanar CT image reconstructions were also generated. COMPARISON:  None. FINDINGS: No evidence of acute fracture or dislocation at the LEFT hip. 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 Deformity of the LEFT humeral head/neck is compatible with previous injury versus chronic deformity related to the overlying degenerative joint disease. Visualized osseous structures of the LEFT hemipelvis appear intact and normally aligned. Additional degenerative change noted at the LEFT SI joint and within  the lower lumbar spine. Visualized soft tissues about the LEFT hip are unremarkable. IMPRESSION: 1. No evidence of acute fracture or dislocation at the LEFT hip. 2. 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 Electronically Signed   By: Franki Cabot M.D.   On: 04/27/2021 17:13   CT CHEST ABDOMEN PELVIS W CONTRAST  Result Date: 04/10/2021 CLINICAL DATA:  Metastatic left breast cancer, assess treatment response, ongoing chemotherapy EXAM: CT CHEST, ABDOMEN, AND PELVIS WITH CONTRAST TECHNIQUE: Multidetector CT imaging of the chest, abdomen and pelvis was performed following the standard protocol during bolus administration of intravenous contrast. CONTRAST:  124m OMNIPAQUE IOHEXOL 300 MG/ML SOLN, additional oral enteric contrast COMPARISON:  01/06/2021 FINDINGS: CT CHEST FINDINGS Cardiovascular: Right chest port catheter. Aortic atherosclerosis. Normal heart size. No pericardial effusion. Mediastinum/Nodes: Numerous enlarged bilateral axillary, lower cervical, and mediastinal lymph nodes. Some nodes are slightly enlarged compared to prior examination, index left subpectoral lymph node measuring 2.7 x 1.5 cm, previously 2.4 x 1.3 cm when measured similarly, index lower left cervical/supraclavicular node measuring 2.8 x 2.0 cm, previously 2.7 x 1.7 cm when measured similarly (series 2, image 6). Others are not significantly changed, for example in the right axilla and in the mediastinum. There is a soft tissue mass in the inferior left axilla measuring 4.0 x 4.0 cm, previously 3.7 x 3.6 cm (series 2, image 25). Thyroid gland, trachea, and esophagus demonstrate no significant findings. Lungs/Pleura: Mild, scarring and volume loss of the right lung base. No pleural effusion or pneumothorax. Musculoskeletal: Extensive skin thickening of the left breast, partially imaged. Significant interval sclerosis of a lytic lesion of the left aspect of the manubrium (series 2, image 14). More subtle interval sclerosis  of a lytic lesion of the posterior aspect of T4 (series 5, image 102). CT ABDOMEN PELVIS FINDINGS Hepatobiliary: Multiple, very subtle hypodense liver lesions are again seen. These are very difficult to compared to immediate prior examination dated 01/06/2021 due to extensive streak artifact at that time, however they appear slightly enlarged compared to more remote prior examination dated 09/15/2020, index nodule in the peripheral right lobe of the liver, hepatic segment VI, measuring 2.3 x 1.5 cm, previously 1.2 x 1.1 cm (series 2, image 44).  Additional index lesion in the peripheral liver dome, hepatic segment V/VIII measures 1.7 x 1.6 cm, previously 1.3 x 1.2 cm (series 2, image 32). Tiny gallstones in the dependent gallbladder. No gallbladder wall thickening, or biliary dilatation. Pancreas: Unremarkable. No pancreatic ductal dilatation or surrounding inflammatory changes. Spleen: Normal in size without significant abnormality. Adrenals/Urinary Tract: Adrenal glands are unremarkable. Kidneys are normal, without renal calculi, solid lesion, or hydronephrosis. Bladder is unremarkable. Stomach/Bowel: Stomach is within normal limits. Appendix appears normal. Unchanged circumferential thickening of the mid ascending colon with prominent subcentimeter adjacent mesenteric lymph nodes (series 4, image 84). Descending and sigmoid diverticulosis. Vascular/Lymphatic: No significant vascular findings are present. Unchanged enlarged portacaval lymph node measuring 3.6 x 1.6 cm (series 2, image 54) Reproductive: Calcified uterine fibroids. Other: No abdominal wall hernia or abnormality. No abdominopelvic ascites. Musculoskeletal: No acute or significant osseous findings. IMPRESSION: 1. Numerous enlarged bilateral axillary, lower cervical, and mediastinal lymph nodes, stable to slightly enlarged, concerning for worsened nodal metastatic disease. 2. Soft tissue mass in the inferior left axilla slightly enlarged, measuring 4.0  x 4.0 cm, previously 3.7 x 3.6 cm. 3. Multiple, very subtle hypodense liver lesions are again seen. These are very difficult to compare to immediate prior examination dated 01/06/2021 due to extensive streak artifact at that time, however they appear enlarged compared to more remote prior examination dated 09/15/2020. 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 4. Slight interval sclerotic healing of osseous metastatic lesions of the manubrium and T4. No new osseous lesions noted. 5. Unchanged enlarged portacaval lymph node. 6. Unchanged skin thickening of the left breast, in keeping with known breast malignancy. 7. Unchanged circumferential wall thickening of the mid ascending colon, in keeping with known colon malignancy. 8. Cholelithiasis. Aortic Atherosclerosis (ICD10-I70.0). Electronically Signed   By: Delanna Ahmadi M.D.   On: 04/10/2021 10:58   DG CHEST PORT 1 VIEW  Result Date: 04/29/2021 CLINICAL DATA:  Leukocytosis, atrial tachycardia, history of breast cancer EXAM: PORTABLE CHEST 1 VIEW COMPARISON:  04/27/2021 FINDINGS: Right chest wall port catheter is unchanged. Persistent elevation of the right hemidiaphragm. No new consolidation or edema. No pleural effusion. Stable cardiomediastinal contours. IMPRESSION: No acute process in the chest. Electronically Signed   By: Macy Mis M.D.   On: 04/29/2021 08:05   ECHOCARDIOGRAM COMPLETE  Result Date: 04/30/2021    ECHOCARDIOGRAM REPORT   Patient Name:   RAEJEAN SWINFORD Date of Exam: 04/30/2021 Medical Rec #:  [id number]       Height:       63.0 in Accession #:    [id number]      Weight:       257.1 lb Date of Birth:  21-Jun-1950       BSA:          2.151 m Patient Age:    70 years        BP:           112/70 mmHg Patient Gender: F               HR:           84 bpm. Exam Location:  [facility] Procedure: 2D Echo, Cardiac Doppler and Color Doppler Indications:    Bacteremia  History:        Patient has prior  history of Echocardiogram examinations, most                 recent 09/20/1942. Signs/Symptoms:Chest Pain; Risk  Factors:Hypertension and Diabetes. Port-a-cath access only, no                 IV, Breast CA,.  Sonographer:    Wenda Low Referring Phys: [id number] COURAGE EMOKPAE  Sonographer Comments: Patient is morbidly obese and Technically difficult study due to poor echo windows. IMPRESSIONS  1. Left ventricular ejection fraction, by estimation, is 60 to 65%. The left ventricle has normal function. Left ventricular endocardial border not optimally defined to evaluate regional wall motion. There is moderate left ventricular hypertrophy. Left ventricular diastolic parameters are indeterminate.  2. Right ventricular systolic function is normal. The right ventricular size is mildly enlarged. There is normal pulmonary artery systolic pressure. The estimated right ventricular systolic pressure is 56.8 mmHg.  3. The mitral valve is grossly normal. Trivial mitral valve regurgitation.  4. The aortic valve is tricuspid. Aortic valve regurgitation is not visualized. Aortic valve sclerosis is present, with no evidence of aortic valve stenosis. Aortic valve mean gradient measures 3.0 mmHg.  5. The inferior vena cava is normal in size with greater than 50% respiratory variability, suggesting right atrial pressure of 3 mmHg. Comparison(s): No significant change from prior study. Prior images reviewed side by side. FINDINGS  Left Ventricle: Left ventricular ejection fraction, by estimation, is 60 to 65%. The left ventricle has normal function. Left ventricular endocardial border not optimally defined to evaluate regional wall motion. The left ventricular internal cavity size was normal in size. There is moderate left ventricular hypertrophy. Left ventricular diastolic parameters are indeterminate. Right Ventricle: The right ventricular size is mildly enlarged. No increase in right ventricular wall thickness.  Right ventricular systolic function is normal. There is normal pulmonary artery systolic pressure. The tricuspid regurgitant velocity is 2.64  m/s, and with an assumed right atrial pressure of 3 mmHg, the estimated right ventricular systolic pressure is 61.6 mmHg. Left Atrium: Left atrial size was normal in size. Right Atrium: Right atrial size was normal in size. Pericardium: There is no evidence of pericardial effusion. Mitral Valve: The mitral valve is grossly normal. Trivial mitral valve regurgitation. MV peak gradient, 3.7 mmHg. The mean mitral valve gradient is 2.0 mmHg. Tricuspid Valve: The tricuspid valve is grossly normal. Tricuspid valve regurgitation is mild. Aortic Valve: The aortic valve is tricuspid. There is mild aortic valve annular calcification. Aortic valve regurgitation is not visualized. Aortic valve sclerosis is present, with no evidence of aortic valve stenosis. Aortic valve mean gradient measures  3.0 mmHg. Aortic valve peak gradient measures 4.9 mmHg. Aortic valve area, by VTI measures 2.32 cm. Pulmonic Valve: The pulmonic valve was grossly normal. Pulmonic valve regurgitation is trivial. Aorta: The aortic root is normal in size and structure. Venous: The inferior vena cava is normal in size with greater than 50% respiratory variability, suggesting right atrial pressure of 3 mmHg. IAS/Shunts: No atrial level shunt detected by color flow Doppler.  LEFT VENTRICLE PLAX 2D LVIDd:         3.70 cm LVIDs:         2.50 cm LV PW:         1.40 cm LV IVS:        1.30 cm LVOT diam:     2.00 cm LV SV:         61 LV SV Index:   28 LVOT Area:     3.14 cm  RIGHT VENTRICLE RV Basal diam:  3.45 cm RV Mid diam:    3.70 cm RV S prime:  11.70 cm/s LEFT ATRIUM           Index        RIGHT ATRIUM           Index LA diam:      3.00 cm 1.39 cm/m   RA Area:     12.60 cm LA Vol (A4C): 26.8 ml 12.46 ml/m  RA Volume:   31.80 ml  14.78 ml/m  AORTIC VALVE                    PULMONIC VALVE AV Area (Vmax):    2.33  cm     PV Vmax:       0.86 m/s AV Area (Vmean):   2.06 cm     PV Peak grad:  3.0 mmHg AV Area (VTI):     2.32 cm AV Vmax:           111.00 cm/s AV Vmean:          81.400 cm/s AV VTI:            0.263 m AV Peak Grad:      4.9 mmHg AV Mean Grad:      3.0 mmHg LVOT Vmax:         82.20 cm/s LVOT Vmean:        53.400 cm/s LVOT VTI:          0.194 m LVOT/AV VTI ratio: 0.74  AORTA Ao Root diam: 3.10 cm Ao Asc diam:  3.10 cm MITRAL VALVE               TRICUSPID VALVE MV Area (PHT): 5.93 cm    TR Peak grad:   27.9 mmHg MV Area VTI:   2.83 cm    TR Vmax:        264.00 cm/s MV Peak grad:  3.7 mmHg MV Mean grad:  2.0 mmHg    SHUNTS MV Vmax:       0.96 m/s    Systemic VTI:  0.19 m MV Vmean:      67.4 cm/s   Systemic Diam: 2.00 cm MV Decel Time: 128 msec MV E velocity: 94.30 cm/s MV A velocity: 91.30 cm/s MV E/A ratio:  1.03 Rozann Lesches MD Electronically signed by Rozann Lesches MD Signature Date/Time: 04/30/2021/10:53:15 AM    Final    DG Hip Unilat W or Wo Pelvis 2-3 Views Left  Result Date: 04/27/2021 CLINICAL DATA:  [place] 2 weeks ago.  Pain. EXAM: DG HIP (WITH OR WITHOUT PELVIS) 2-3V LEFT COMPARISON:  None. FINDINGS: There is advanced osteoarthritis of the left hip with joint space narrowing, sclerosis, osteophyte and flattening of femoral head. No definite acute regional fracture. 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 IMPRESSION: Advanced osteoarthritis of the left hip. No visible traumatic finding. Flattening of the humeral head which is probably chronic. Electronically Signed   By: Nelson Chimes M.D.   On: 04/27/2021 13:17     ASSESSMENT:  ***   PLAN:  ***   Orders placed this encounter:  No orders of the defined types were placed in this encounter.    [name], MD [facility] [phone]   I, ***, am acting as a scribe for Dr. [name].  {Add Barista Statement}

## 2021-05-05 NOTE — TOC Progression Note (Addendum)
Transition of Care [facility]) - Progression Note    Patient Details  Name: Crystal Vega MRN: [id number] Date of Birth: 11/23/1950  Transition of Care [facility]) CM/SW Contact  Shade Flood, LCSW Phone Number: 05/05/2021, 3:48 PM  Clinical Narrative:     TOC following. [employer] denied SNF rehab earlier today. Dr. Denton Brick completed peer to peer appeal and denial was reversed per Holland Falling MD. Arlington Calix at [facility] and she states that approval is not showing up in the [facility] portal yet. Per Jackelyn Poling, they can take pt tomorrow as long as they can confirm that pt approved.  Covering TOC will follow up in AM.  1555: Debbie received notification of [employer] approval however, she cannot admit pt today as it is too late in the day for them to order medications. Per Jackelyn Poling, they can take pt tomorrow AM.  Expected Discharge Plan: Skilled Nursing Facility Barriers to Discharge: Continued Medical Work up  Expected Discharge Plan and Services Expected Discharge Plan: [place] In-house Referral: Clinical Social Work   Post Acute Care Choice: [place] Living arrangements for the past 2 months: Single Family Home                                       Social Determinants of Health (SDOH) Interventions    Readmission Risk Interventions Readmission Risk Prevention Plan 04/28/2021  Transportation Screening Complete  Home Care Screening Complete  Medication Review (RN CM) Complete  Some recent data might be hidden

## 2021-05-05 NOTE — Progress Notes (Signed)
Dr Jacolyn Reedy consult note from last week reviewed, Dr McDowell's followe up notes [place]. Tele review shows NSR. We will sign off inpatient care, call us back if inpatient cardiology is needed  Zandra Abts MD

## 2021-05-05 NOTE — Progress Notes (Signed)
Nutrition Follow-up  DOCUMENTATION CODES:   Morbid obesity  INTERVENTION:  -Continue Ensure Max daily  Add-1 packet Juven BID  to support wound healing   NUTRITION DIAGNOSIS:   Inadequate oral intake related to poor appetite as evidenced by per patient/family report (bites of meal).  -improving  GOAL:   Patient will meet greater than or equal to 90% of their needs  -progressing  MONITOR:   PO intake, Supplement acceptance, Labs, Skin, Weight trends  ASSESSMENT:   Patient is an obese 70 yo female with hx of DM2, HTN, Anemia, breast cancer and arthritis. Presents with joint pain after a fall, tachycardia. Cardiology following. CT of left hip-no acute fracture. Advanced DJD. Advanced Osteoarthritis.  Patient is eating better than overall compared to reported intake on initial assessment. PO: 50-75% of recent meals. She doesn't like the ONS but is trying to drink one daily.   Patient last BM 12/3.   Multiple stage 2 pressure injuries present. Encouraged pt to consume protein each meal to support wound healing.   Labs: BMP Latest Ref Rng & Units 05/04/2021 05/01/2021 04/30/2021  Glucose 70 - 99 mg/dL 110(H) 129(H) 192(H)  BUN 8 - 23 mg/dL 15 23 24(H)  Creatinine 0.44 - 1.00 mg/dL 0.41(L) 0.51 0.51  Sodium 135 - 145 mmol/L 131(L) 133(L) 130(L)  Potassium 3.5 - 5.1 mmol/L 4.0 4.1 4.2  Chloride 98 - 111 mmol/L 99 102 98  CO2 22 - 32 mmol/L 28 23 26   Calcium 8.9 - 10.3 mg/dL 8.2(L) 8.2(L) 8.6(L)      Diet Order:   Diet Order             Diet regular Room service appropriate? Yes; Fluid consistency: Thin; Fluid restriction: 1500 mL Fluid  Diet effective now                   EDUCATION NEEDS:   Education needs have been addressed  Skin:  Skin Assessment: Reviewed RN Assessment (non pressure wound to left breast related to breast cancer), stage 2 x 3 right and left buttocks.  Last BM:  12/3  Height:   Ht Readings from Last 1 Encounters:  04/28/21 5\' 3"  (1.6  m)    Weight:   Wt Readings from Last 1 Encounters:  04/30/21 118.3 kg    Ideal Body Weight:   52 kg   BMI:  Body mass index is 46.2 kg/m.  Estimated Nutritional Needs:   Kcal:  1700-1800  Protein:  90-95 gr  Fluid:  1500 ml daily   Colman Cater MS,RD,CSG,LDN Contact: Shea Evans

## 2021-05-06 ENCOUNTER — Other Ambulatory Visit (HOSPITAL_COMMUNITY): Payer: Medicare HMO

## 2021-05-06 ENCOUNTER — Ambulatory Visit (HOSPITAL_COMMUNITY): Payer: Medicare HMO | Admitting: Hematology

## 2021-05-06 ENCOUNTER — Ambulatory Visit (HOSPITAL_COMMUNITY): Payer: Medicare HMO

## 2021-05-06 DIAGNOSIS — C50912 Malignant neoplasm of unspecified site of left female breast: Secondary | ICD-10-CM

## 2021-05-06 DIAGNOSIS — L89302 Pressure ulcer of unspecified buttock, stage 2: Secondary | ICD-10-CM

## 2021-05-06 DIAGNOSIS — Z95828 Presence of other vascular implants and grafts: Secondary | ICD-10-CM

## 2021-05-06 LAB — COMPREHENSIVE METABOLIC PANEL
ALT: 16 U/L (ref 0–44)
AST: 25 U/L (ref 15–41)
Albumin: 2 g/dL — ABNORMAL LOW (ref 3.5–5.0)
Alkaline Phosphatase: 116 U/L (ref 38–126)
Anion gap: 8 (ref 5–15)
BUN: 15 mg/dL (ref 8–23)
CO2: 26 mmol/L (ref 22–32)
Calcium: 8.3 mg/dL — ABNORMAL LOW (ref 8.9–10.3)
Chloride: 99 mmol/L (ref 98–111)
Creatinine, Ser: 0.42 mg/dL — ABNORMAL LOW (ref 0.44–1.00)
GFR, Estimated: >60 mL/min (ref >60)
Glucose, Bld: 108 mg/dL — ABNORMAL HIGH (ref 70–99)
Potassium: 3.8 mmol/L (ref 3.5–5.1)
Sodium: 133 mmol/L — ABNORMAL LOW (ref 135–145)
Total Bilirubin: 0.3 mg/dL (ref 0.3–1.2)
Total Protein: 5.9 g/dL — ABNORMAL LOW (ref 6.5–8.1)

## 2021-05-06 LAB — GLUCOSE, CAPILLARY
Glucose-Capillary: 61 mg/dL — ABNORMAL LOW (ref 70–99)
Glucose-Capillary: 170 mg/dL — ABNORMAL HIGH (ref 70–99)
Glucose-Capillary: 150 mg/dL — ABNORMAL HIGH (ref 70–99)

## 2021-05-06 MED ORDER — HEPARIN SOD (PORK) LOCK FLUSH 100 UNIT/ML IV SOLN
10.0000 [IU] | Freq: Once | INTRAVENOUS | Status: DC
  Filled 2021-05-06: qty 5

## 2021-05-06 MED ORDER — HEPARIN SOD (PORK) LOCK FLUSH 100 UNIT/ML IV SOLN
500.0000 [IU] | Freq: Once | INTRAVENOUS | Status: AC
Start: 2021-05-06 — End: 2021-05-06
  Administered 2021-05-06: 500 [IU] via INTRAVENOUS

## 2021-05-06 NOTE — TOC Transition Note (Signed)
Transition of Care [facility]) - CM/SW Discharge Note   Patient Details  Name: Crystal Vega MRN: [id number] Date of Birth: 1950-07-18  [facility] CM/SW Contact:  Salome Arnt, LCSW Phone Number: 05/06/2021, 12:39 PM   Clinical Narrative:  Pt d/c today to [place]. Authorization received. Pt aware and agreeable. Voicemail left for pt's husband. Pt states she will contact him once she arrives at [place] as well. Transport arranged with [employer]. RN given number to call report. D/C summary sent to SNF.      Final next level of care: Skilled Nursing Facility Barriers to Discharge: Barriers Resolved   Patient Goals and CMS Choice Patient states their goals for this hospitalization and ongoing recovery are:: get better CMS Medicare.gov Compare Post Acute Care list provided to:: Patient Choice offered to / list presented to : Patient  Discharge Placement              Patient chooses bed at: Other - please specify in the comment section below: ([facility]) Patient to be transferred to facility by: [facility] Name of family member notified: husband- voicemail Patient and family notified of of transfer: 05/06/21  Discharge Plan and Services In-house Referral: Clinical Social Work   Post Acute Care Choice: [place]          DME Arranged: N/A                    Social Determinants of Health ([place]) Interventions     Readmission Risk Interventions Readmission Risk Prevention Plan 04/28/2021  Transportation Screening Complete  Home Care Screening Complete  Medication Review (RN CM) Complete  Some recent data might be hidden

## 2021-05-06 NOTE — Progress Notes (Signed)
Nsg Discharge Note  Admit Date:  04/27/2021 Discharge date: 05/06/2021   Cristine Polio to be D/C'd Rehab per MD order.  AVS completed.  Copy for chart, and copy for patient signed, and dated. Patient/caregiver able to verbalize understanding.  Discharge Medication: Allergies as of 05/06/2021   No Known Allergies      Medication List     STOP taking these medications    chlorpheniramine-HYDROcodone 10-8 MG/5ML [place] Commonly known as: TUSSIONEX   ciprofloxacin 500 MG tablet Commonly known as: Cipro   hydrochlorothiazide 25 MG tablet Commonly known as: HYDRODIURIL   HYDROcodone-acetaminophen 5-325 MG tablet Commonly known as: NORCO/VICODIN   nitrofurantoin (macrocrystal-monohydrate) 100 MG capsule Commonly known as: Macrobid   prochlorperazine 10 MG tablet Commonly known as: COMPAZINE       TAKE these medications    acetaminophen 325 MG tablet Commonly known as: TYLENOL Take 2 tablets (650 mg total) by mouth every 6 (six) hours as needed for mild pain (or Fever >/= 101).   amoxicillin 500 MG capsule Commonly known as: AMOXIL Take 2 capsules (1,000 mg total) by mouth 3 (three) times daily for 10 days.   apixaban 5 MG Tabs tablet Commonly known as: Eliquis Take 1 tablet (5 mg total) by mouth 2 (two) times daily.   Ensure Max Protein Liqd Take 330 mLs (11 oz total) by mouth daily.   nutrition supplement (JUVEN) Pack Take 1 packet by mouth 2 (two) times daily between meals.   fentaNYL 50 MCG/HR Commonly known as: [place] 1 patch onto the skin every 3 (three) days.   ferrous sulfate 325 (65 FE) MG tablet Take 325 mg by mouth daily with breakfast.   folic acid 1 MG tablet Commonly known as: FOLVITE TAKE 1 TABLET BY MOUTH EVERY DAY   gabapentin 300 MG capsule Commonly known as: NEURONTIN Take 1 capsule (300 mg total) by mouth 3 (three) times daily.   Gerhardt's butt cream Crea Apply 1 application topically 3 (three) times daily.    HYDROmorphone 2 MG tablet Commonly known as: Dilaudid Take 1 tablet (2 mg total) by mouth every 4 (four) hours as needed for severe pain.   Klor-Con M10 10 MEQ tablet Generic drug: potassium chloride Take 10 mEq by mouth daily.   lidocaine 5 % Commonly known as: LIDODERM Place 1 patch onto the skin daily. Remove & Discard patch within 12 hours or as directed by MD   lidocaine-prilocaine cream Commonly known as: EMLA Apply 1 application topically as needed.   losartan 50 MG tablet Commonly known as: COZAAR Take 50 mg by mouth daily.   metFORMIN 500 MG tablet Commonly known as: GLUCOPHAGE Take 500 mg by mouth 2 (two) times daily with a meal.   metoprolol succinate 25 MG 24 hr tablet Commonly known as: Toprol XL Take 1 tablet (25 mg total) by mouth daily.   ondansetron 4 MG tablet Commonly known as: Zofran Take 1 tablet (4 mg total) by mouth daily as needed for nausea or vomiting.   polyethylene glycol 17 g packet Commonly known as: MIRALAX / GLYCOLAX Take 17 g by mouth 2 (two) times daily.   pravastatin 10 MG tablet Commonly known as: PRAVACHOL Take 10 mg by mouth daily.   senna-docusate 8.6-50 MG tablet Commonly known as: Senokot-S Take 2 tablets by mouth at bedtime.               Discharge Care Instructions  (From admission, onward)  Start     Ordered   05/06/21 0000  Discharge wound care:       Comments: Provide constant repositioning; use contact barriers and keep area clean and dry.   05/06/21 0836            Discharge Assessment: Vitals:   05/05/21 2124 05/06/21 0519  BP: (!) 110/58 (!) 108/56  Pulse: 92 91  Resp: 18 20  Temp: 98.1 F (36.7 C) (!) 97.4 F (36.3 C)  SpO2: 99% 98%   Skin clean, dry and intact without evidence of skin break down, no evidence of skin tears noted. IV catheter discontinued intact. Site without signs and symptoms of complications - no redness or edema noted at insertion site, patient denies c/o  pain - only slight tenderness at site.  Dressing with slight pressure applied.  D/c Instructions-Education: Discharge instructions given to patient/family with verbalized understanding. D/c education completed with patient/family including follow up instructions, medication list, d/c activities limitations if indicated, with other d/c instructions as indicated by MD - patient able to verbalize understanding, all questions fully answered. Patient instructed to return to ED, call 911, or call MD for any changes in condition.  Patient escorted via [place], and D/C home via private auto.  Clovis Fredrickson, LPN 56/07/1495 0:26 PM

## 2021-05-06 NOTE — Discharge Summary (Signed)
Physician Discharge Summary  [name]:[id number] DOB: 08/25/1950 DOA: 04/27/2021  PCP: Lemmie Evens, MD  Admit date: 04/27/2021 Discharge date: 05/06/2021  Time spent: 35 minutes  Recommendations for Outpatient Follow-up:  Complete antibiotics as instructed Maintain adequate hydration Follow up with PCP in 2 weeks Heart healthy/low sodium diet recommended. Physical therapy and rehab as per SNF protocol. Repeat CBC in 1 week to follow hemoglobin and platelet count stability. Repeat basic metabolic panel in 1 week to follow electrolytes and renal function trend.   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  Discharge to skilled nursing facility for further care and rehabilitation.  CODE STATUS: DNR/DNI  Diet recommendation: Heart healthy/modified carbohydrate diet  Filed Weights   04/28/21 0400 04/28/21 1631 04/30/21 0700  Weight: 115.1 kg 116.6 kg 118.3 kg    History of present illness:  As per H&P written by Dr. Denton Brick on 04/27/2021 CHRISTINEA Vega is a 70 y.o. female with medical history significant for breast cancer undergoing chemotherapy, hypertension, diabetes, colon mass, iron deficiency anemia. Patient was brought to the ED by EMS with reports of left leg pain.  Reports she was getting out of bed 2 weeks ago, she put her legs on the floor and slid down landing on her butt.  She did not hit her head or and knee when she fell.  Since the fall she has not been able to ambulate.  She has not been able to lift  her left leg due to pain.  Prior to the fall she has had chronic problems-with pain involving her left lower extremity hip and knees. She denies any change in stools, reports her stools are black chronically as she is on iron tablets.  No vomiting of blood and no blood in her stools.  Denies palpitations.  No chest pain , no  difficulty breathing.   She reports she has been lying in bed for the past 2 weeks and her husband brings food for her, but she has not been able to eat well.   ED Course: Tachycardic to 160s at the time of my evaluation, temperature 98.6.  Respiratory rate 18-26.  EKG shows sinus tachycardia.  O2 sats 98- 100 % on room air.  Hemoglobin 7.3.  Platelets 56.  Sodium 129.   Pelvic x-ray shows advanced osteoarthritis of the left hip no visible traumatic finding, subsequent CT left hip confirms the same.   Left knee x-ray shows osteoarthritis and joint effusion , subsequent CT left knee-shows severe osteoarthritis of the knee with proliferative spurring, no fractures, but sensitivity for subtle structures is reduced due to degree of spurring and associated cortical irregularities, also moderate knee effusion and subcutaneous edema anterior to the patella.   Chest x-ray-without finding, but with persistent tachycardia, CTA Chest was obtained, and abdominal CT with contrast obtained to rule out intra-abdominal bleed -no PE, no pneumonia or pulmonary edema.  Details metastatic lymphadenopathy, liver lesions, lytic appearing lesion within C6 vertebral.  Hospital Course:  1-left knee/lower back pain -Secondary to degenerative osteoarthritis and  radiculopathy -No fractures identified -Appreciate recommendations by orthopedic surgery; expressing steroid injections into the joint will not be helpful at this time. -Continue gabapentin 300 mg 3 times a day along with the use of Duragesic patch for pain management. -Physical therapy has been recommended.  2-streptococcal  bacteremia -Without any association to chronic Port-A-Cath -Case discussed with infectious disease (Dr. Jule Ser), who recommended a total of 14 days of amoxicillin 1 g 3 times a day. -Patient has also received 5 days of IV ceftriaxone while receiving treatment for UTI. -No fever, normal WBCs at discharge.  3-atrial tachycardia -Appreciate cardiology service recommendations -Echocardiogram with preserved ejection fraction and no significant wall motion abnormalities of bipolar disease -Continue metoprolol 25 mg twice a day -Rate controlled and is stable at discharge. -Sinus rhythm appreciated  4-Klebsiella pneumonia and Proteus mirabilis UTI -Maintain adequate hydration -Patient has been treated with ceftriaxone for 5 days. -No dysuria setting at discharge.  5-metastatic left breast cancer -Continue oncology therapy as per Dr. Tera Helper  -Port-A-Cath in place.  6-type 2 diabetes -Modify carbohydrate diet recommended -Continue the use of metformin.  7-right colon adenocarcinoma -Case discussed by Dr. Denton Brick with oncologist (Dr. Delton Coombes), not a candidate for surgical or aggressive treatment for her colon malignancy due to ongoing metastatic breast cancer. -Patient with episodes of regarding bloody stool suspected from colon malignancy. -Overall prognosis is poor. -Continue following hemoglobin levels and as needed transfusion; goal is for hemoglobin above 7. -Patient will continue follow-up with oncology as an outpatient.  8-left leg DVT -Was receiving apixaban; currently on hold due to severe thrombocytopenia in the setting of malignancy and chemotherapy treatment. -Follow-up recommendations by oncology service -Continue to follow platelets count.  9-anemia of neoplastic disease with superimposed acute blood loss from colon malignancy -Status post 1 unit PRBC transfusion during this hospitalization -Hemoglobin has remained stable -As mentioned above hemoglobin goal  is to be above 7.  10-stage II left and right buttocks pressure injuries present on admission -Continue constant repositioning -Consult barriers and keeping area clean and dry recommended.  11-morbid obesity -Body mass index is 46.2 kg/m. -Low calorie diet, portion control and increase physical activity discussed with patient.  12-weakness and deconditioning/ambulatory dysfunction -Patient will be discharged to skilled nursing facility for rehabilitation and conditioning.  13-social/ethics: Patient with multiple comorbidities, overall process. -Palliative care consultation appreciated -Patient is DNR/DNI -Continue outpatient follow-up with oncology service  Procedures: See below for x-ray reports.  Consultations: Palliative care Infectious disease Orthopedic  Cardiology  Oncology service (Dr. Delton Coombes) was curbside by Dr. Denton Brick.  Discharge Exam: Vitals:   05/05/21 2124 05/06/21 0519  BP: (!) 110/58 (!) 108/56  Pulse: 92 91  Resp: 18 20  Temp: 98.1 F (36.7 C) (!) 97.4 F (36.3 C)  SpO2: 99% 98%    General: Afebrile, no chest pain, no nausea vomiting.  Chronically ill in appearance and deconditioned.  Patient denies palpitations. Cardiovascular: S1 and S2, no rubs, no gallops, no JVD on exam. Respiratory: Clear to auscultation bilaterally; no using accessory muscle.  Good saturation on room air Abdomen: Soft, nontender, positive bowel sounds Extremities: No cyanosis or clubbing.  Discharge Instructions   Discharge Instructions     Discharge instructions   Complete by: As directed    Complete antibiotics as instructed Maintain adequate hydration Follow up with PCP in 2 weeks Heart healthy/low sodium diet recommended. Physical therapy and rehab as per SNF protocol.   Discharge wound care:   Complete by: As directed    Provide constant  repositioning; use contact barriers and keep area clean and dry.   Increase activity slowly   Complete by: As directed        Allergies as of 05/06/2021   No Known Allergies      Medication List     STOP taking these medications    chlorpheniramine-HYDROcodone 10-8 MG/5ML Suer Commonly known as: TUSSIONEX   ciprofloxacin 500 MG tablet Commonly known as: Cipro   hydrochlorothiazide 25 MG tablet Commonly known as: HYDRODIURIL   HYDROcodone-acetaminophen 5-325 MG tablet Commonly known as: NORCO/VICODIN   nitrofurantoin (macrocrystal-monohydrate) 100 MG capsule Commonly known as: Macrobid   prochlorperazine 10 MG tablet Commonly known as: COMPAZINE       TAKE these medications    acetaminophen 325 MG tablet Commonly known as: TYLENOL Take 2 tablets (650 mg total) by mouth every 6 (six) hours as needed for mild pain (or Fever >/= 101).   amoxicillin 500 MG capsule Commonly known as: AMOXIL Take 2 capsules (1,000 mg total) by mouth 3 (three) times daily for 10 days.   apixaban 5 MG Tabs tablet Commonly known as: Eliquis Take 1 tablet (5 mg total) by mouth 2 (two) times daily.   Ensure Max Protein Liqd Take 330 mLs (11 oz total) by mouth daily.   nutrition supplement (JUVEN) Pack Take 1 packet by mouth 2 (two) times daily between meals.   fentaNYL 50 MCG/HR Commonly known as: [place] 1 patch onto the skin every 3 (three) days.   ferrous sulfate 325 (65 FE) MG tablet Take 325 mg by mouth daily with breakfast.   folic acid 1 MG tablet Commonly known as: FOLVITE TAKE 1 TABLET BY MOUTH EVERY DAY   gabapentin 300 MG capsule Commonly known as: NEURONTIN Take 1 capsule (300 mg total) by mouth 3 (three) times daily.   Gerhardt's butt cream Crea Apply 1 application topically 3 (three) times daily.   HYDROmorphone 2 MG tablet Commonly known as: Dilaudid Take 1 tablet (2 mg total) by mouth every 4 (four) hours as needed for severe pain.   Klor-Con M10 10 MEQ tablet Generic drug: potassium chloride Take 10 mEq by mouth daily.   lidocaine 5 % Commonly known as:  LIDODERM Place 1 patch onto the skin daily. Remove & Discard patch within 12 hours or as directed by MD   lidocaine-prilocaine cream Commonly known as: EMLA Apply 1 application topically as needed.   losartan 50 MG tablet Commonly known as: COZAAR Take 50 mg by mouth daily.   metFORMIN 500 MG tablet Commonly known as: GLUCOPHAGE Take 500 mg by mouth 2 (two) times daily with a meal.   metoprolol succinate 25 MG 24 hr tablet Commonly known as: Toprol XL Take 1 tablet (25 mg total) by mouth daily.   ondansetron 4 MG tablet Commonly known as: Zofran Take 1 tablet (4 mg total) by mouth daily as needed for nausea or vomiting.   polyethylene glycol 17 g packet Commonly known as: MIRALAX / GLYCOLAX Take 17 g by mouth 2 (two) times daily.   pravastatin 10 MG tablet Commonly known as: PRAVACHOL Take 10 mg by mouth daily.   senna-docusate 8.6-50 MG tablet Commonly known as: Senokot-S Take 2 tablets by mouth at bedtime.               Discharge Care Instructions  (From admission, onward)           Start     Ordered   05/06/21 0000  Discharge wound care:  Comments: Provide constant repositioning; use contact barriers and keep area clean and dry.   05/06/21 0836           No Known Allergies  Contact information for after-discharge care     [facility] .   Service: Skilled Nursing Contact information: [address] [place] [phone]                     The results of significant diagnostics from this hospitalization (including imaging, microbiology, ancillary and laboratory) are listed below for reference.    Significant Diagnostic Studies: DG Chest 1 View  Result Date: 04/27/2021 CLINICAL DATA:  [place] 2 weeks ago EXAM: CHEST  1 VIEW COMPARISON:  07/28/2020 FINDINGS: Power port on the right with the tip in the right atrium, possibly approaching the tricuspid  valve. No evidence of heart failure. Patient has taken a poor inspiration. No traumatic regional finding. IMPRESSION: Poor inspiration. No traumatic finding. Power port tip in the right atrium, possibly near the tricuspid valve. Electronically Signed   By: Nelson Chimes M.D.   On: 04/27/2021 13:18   DG Lumbar Spine 2-3 Views  Result Date: 04/29/2021 CLINICAL DATA:  Low back and left hip pain EXAM: LUMBAR SPINE - 2-3 VIEW COMPARISON:  CT 04/27/2021 FINDINGS: Limited resolution. Five lumbar type vertebral bodies do not show any visible malalignment. There is degenerative disc disease and degenerative facet disease throughout the lumbar region. Bilateral sacroiliac degenerated changes noted. Advanced chronic arthropathy of the hips, left worse than right. IMPRESSION: Limited imaging. 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 Bilateral sacroiliac arthritis. Electronically Signed   By: Nelson Chimes M.D.   On: 04/29/2021 09:52   DG Knee 1-2 Views Left  Result Date: 04/27/2021 CLINICAL DATA:  [place] 2 weeks ago.  Pain and limited range of motion. EXAM: LEFT KNEE - 1-2 VIEW COMPARISON:  None. FINDINGS: There is tricompartmental osteoarthritis with a joint effusion. No evidence of fracture or focal bone lesion. IMPRESSION: Osteoarthritis and joint effusion. No traumatic bone finding visible. Electronically Signed   By: Nelson Chimes M.D.   On: 04/27/2021 13:16   CT Angio Chest PE W and/or Wo Contrast  Result Date: 04/27/2021 CLINICAL DATA:  PE suspected, high probability. LEFT leg pain and weakness status post fall 2 weeks ago. History of metastatic LEFT breast cancer, per clinical data provided on previous CT report. EXAM: CT ANGIOGRAPHY CHEST CT ABDOMEN AND PELVIS WITH CONTRAST TECHNIQUE: Multidetector CT imaging of the chest was performed using the standard protocol during bolus administration of intravenous contrast. Multiplanar CT image reconstructions and MIPs  were obtained to evaluate the vascular anatomy. Multidetector CT imaging of the abdomen and pelvis was performed using the standard protocol during bolus administration of intravenous contrast. CONTRAST:  182mL OMNIPAQUE IOHEXOL 350 MG/ML SOLN COMPARISON:  CT chest abdomen and pelvis dated 04/09/2021. FINDINGS: CTA CHEST FINDINGS Cardiovascular: Some of the most peripheral subsegmental pulmonary arteries are difficult to definitively characterize due to patient breathing motion artifact, however, there is no pulmonary embolism seen within the main, lobar or central segmental pulmonary arteries bilaterally. No thoracic aortic aneurysm or evidence of aortic dissection. No pericardial effusion. Mediastinum/Nodes: Mediastinal and perihilar lymphadenopathy, including a 1.2 cm short axis lymph node within the anterior mediastinum and a 1.6 cm short axis lymph node within the RIGHT lower paratracheal mediastinum, not significantly changed compared to the recent chest CT of 04/09/2021, compatible  with metastatic lymphadenopathy. Additional conglomerate lymphadenopathy is also again seen within the LEFT supraclavicular region, incompletely imaged. z Esophagus is unremarkable. Trachea and central bronchi are unremarkable. Lungs/Pleura: Lungs are clear.  No pleural effusion or pneumothorax. Musculoskeletal: No acute appearing osseous abnormality. Lytic-appearing lesion within the T6 vertebral body is suspicious for osseous metastasis. Additional destructive/lytic changes within the sternum, also suggesting metastatic disease. Conglomerate lymphadenopathy within the LEFT axilla, and mass/lymphadenopathy in the axillary tail region of the LEFT breast measures 4 cm greatest dimension, all of which is not significantly changed compared to the recent chest CT of 04/09/2021. Additional milder lymphadenopathy within the RIGHT axilla is redemonstrated and stable. Review of the MIP images confirms the above findings. CT ABDOMEN and  PELVIS FINDINGS Hepatobiliary: Numerous small hypodense lesions within the bilateral liver lobes, as also described on the earlier CT abdomen report of 12/12/2019, presumably numerous small liver metastases. Single stone within the otherwise normal-appearing gallbladder. No bile duct dilatation is seen. Pancreas: Unremarkable. No pancreatic ductal dilatation or surrounding inflammatory changes. Spleen: Normal in size without focal abnormality. Adrenals/Urinary Tract: Adrenals are unremarkable. Bilateral renal cysts. Kidneys are otherwise unremarkable without suspicious mass, stone or hydronephrosis. No ureteral or bladder calculi are identified. Bladder is unremarkable. Stomach/Bowel: No dilated large or small bowel loops. Scattered diverticulosis of the descending and sigmoid colon but no focal inflammatory change to suggest acute diverticulitis. No evidence of acute bowel wall inflammation. Stomach is unremarkable. Appendix is normal. Vascular/Lymphatic: Vascular structures of the abdomen and pelvis are unremarkable. Conglomerate lymphadenopathy above the pancreatic head and adjacent to the porta hepatis, as previously described, presumed lymph node metastases. Reproductive: Calcified uterine fibroids. No adnexal mass or free fluid. Other: No free fluid or abscess collection. No free intraperitoneal air. Musculoskeletal: No acute findings. Degenerative spondylosis of the lumbar spine, mild to moderate in degree. 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 Review of the MIP images confirms the above findings. IMPRESSION: 1. No acute findings within the chest. No pulmonary embolism is seen, with mild study limitations detailed above. No evidence of pneumonia or pulmonary edema. 2. Metastatic lymphadenopathy is redemonstrated within the mediastinum, bilateral axillae, and supraclavicular LEFT neck. Also redemonstrated is the mass versus conglomerate lymphadenopathy in the axillary tail region of the LEFT breast. These findings are  stable compared to the recent chest CT of 04/09/2021. 3. Lytic-appearing lesion within the T6 vertebral body, and destructive/lytic changes within the sternum, suspicious for metastatic osseous disease. Consider nuclear medicine bone scan or PET scan for confirmation. 4. Numerous small hypodense lesions within the bilateral liver lobes, as also described on the earlier CT abdomen report of 12/12/2019, presumably liver metastases. 5. Cholelithiasis without evidence of acute cholecystitis. 6. Colonic diverticulosis without evidence of acute diverticulitis. 7. No acute findings within the abdomen or pelvis. No bowel obstruction or evidence of acute bowel wall inflammation. No free fluid or abscess collection. No evidence of acute solid organ abnormality. 8. No evidence of acute osseous fracture or dislocation is seen. Electronically Signed   By: Franki Cabot M.D.   On: 04/27/2021 17:09   CT Knee Left Wo Contrast  Result Date: 04/27/2021 CLINICAL DATA:  Fall 2 weeks ago comment limited range of motion EXAM: CT OF THE LEFT KNEE WITHOUT CONTRAST TECHNIQUE: Multidetector CT imaging of the left knee was performed according to the standard protocol. Multiplanar CT image reconstructions were also generated. COMPARISON:  Radiographs 04/27/2021 FINDINGS: Bones/Joint/Cartilage The knee was imaged in a moderately flexed orientation. There is marked proliferative spurring all 3  compartments with associated cortical irregularity which can reduce sensitivity for subtle fractures. I do not see a well-defined cortical discontinuity to indicate fracture. There is a moderate knee effusion in the suprapatellar bursa. Ligaments Suboptimally assessed by CT. Muscles and Tendons Mild regional muscular atrophy diffusely. Soft tissues Atherosclerosis. Subcutaneous edema anterior to the patella, cannot exclude prepatellar bruising. IMPRESSION: 1. 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 No fracture is identified,  although sensitivity for subtle fractures is reduced due to the degree of spurring and associated cortical irregularities. 2. Moderate knee effusion. 3. Atherosclerosis. 4. Subcutaneous edema anterior to the patella. Electronically Signed   By: Van Clines M.D.   On: 04/27/2021 17:15   CT ABDOMEN PELVIS W CONTRAST  Result Date: 04/27/2021 CLINICAL DATA:  PE suspected, high probability. LEFT leg pain and weakness status post fall 2 weeks ago. History of metastatic LEFT breast cancer, per clinical data provided on previous CT report. EXAM: CT ANGIOGRAPHY CHEST CT ABDOMEN AND PELVIS WITH CONTRAST TECHNIQUE: Multidetector CT imaging of the chest was performed using the standard protocol during bolus administration of intravenous contrast. Multiplanar CT image reconstructions and MIPs were obtained to evaluate the vascular anatomy. Multidetector CT imaging of the abdomen and pelvis was performed using the standard protocol during bolus administration of intravenous contrast. CONTRAST:  136mL OMNIPAQUE IOHEXOL 350 MG/ML SOLN COMPARISON:  CT chest abdomen and pelvis dated 04/09/2021. FINDINGS: CTA CHEST FINDINGS Cardiovascular: Some of the most peripheral subsegmental pulmonary arteries are difficult to definitively characterize due to patient breathing motion artifact, however, there is no pulmonary embolism seen within the main, lobar or central segmental pulmonary arteries bilaterally. No thoracic aortic aneurysm or evidence of aortic dissection. No pericardial effusion. Mediastinum/Nodes: Mediastinal and perihilar lymphadenopathy, including a 1.2 cm short axis lymph node within the anterior mediastinum and a 1.6 cm short axis lymph node within the RIGHT lower paratracheal mediastinum, not significantly changed compared to the recent chest CT of 04/09/2021, compatible with metastatic lymphadenopathy. Additional conglomerate lymphadenopathy is also again seen within the LEFT supraclavicular region,  incompletely imaged. z Esophagus is unremarkable. Trachea and central bronchi are unremarkable. Lungs/Pleura: Lungs are clear.  No pleural effusion or pneumothorax. Musculoskeletal: No acute appearing osseous abnormality. Lytic-appearing lesion within the T6 vertebral body is suspicious for osseous metastasis. Additional destructive/lytic changes within the sternum, also suggesting metastatic disease. Conglomerate lymphadenopathy within the LEFT axilla, and mass/lymphadenopathy in the axillary tail region of the LEFT breast measures 4 cm greatest dimension, all of which is not significantly changed compared to the recent chest CT of 04/09/2021. Additional milder lymphadenopathy within the RIGHT axilla is redemonstrated and stable. Review of the MIP images confirms the above findings. CT ABDOMEN and PELVIS FINDINGS Hepatobiliary: Numerous small hypodense lesions within the bilateral liver lobes, as also described on the earlier CT abdomen report of 12/12/2019, presumably numerous small liver metastases. Single stone within the otherwise normal-appearing gallbladder. No bile duct dilatation is seen. Pancreas: Unremarkable. No pancreatic ductal dilatation or surrounding inflammatory changes. Spleen: Normal in size without focal abnormality. Adrenals/Urinary Tract: Adrenals are unremarkable. Bilateral renal cysts. Kidneys are otherwise unremarkable without suspicious mass, stone or hydronephrosis. No ureteral or bladder calculi are identified. Bladder is unremarkable. Stomach/Bowel: No dilated large or small bowel loops. Scattered diverticulosis of the descending and sigmoid colon but no focal inflammatory change to suggest acute diverticulitis. No evidence of acute bowel wall inflammation. Stomach is unremarkable. Appendix is normal. Vascular/Lymphatic: Vascular structures of the abdomen and pelvis are unremarkable. Conglomerate lymphadenopathy above the  pancreatic head and adjacent to the porta hepatis, as previously  described, presumed lymph node metastases. Reproductive: Calcified uterine fibroids. No adnexal mass or free fluid. Other: No free fluid or abscess collection. No free intraperitoneal air. Musculoskeletal: No acute findings. Degenerative spondylosis of the lumbar spine, mild to moderate in degree. 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 Review of the MIP images confirms the above findings. IMPRESSION: 1. No acute findings within the chest. No pulmonary embolism is seen, with mild study limitations detailed above. No evidence of pneumonia or pulmonary edema. 2. Metastatic lymphadenopathy is redemonstrated within the mediastinum, bilateral axillae, and supraclavicular LEFT neck. Also redemonstrated is the mass versus conglomerate lymphadenopathy in the axillary tail region of the LEFT breast. These findings are stable compared to the recent chest CT of 04/09/2021. 3. Lytic-appearing lesion within the T6 vertebral body, and destructive/lytic changes within the sternum, suspicious for metastatic osseous disease. Consider nuclear medicine bone scan or PET scan for confirmation. 4. Numerous small hypodense lesions within the bilateral liver lobes, as also described on the earlier CT abdomen report of 12/12/2019, presumably liver metastases. 5. Cholelithiasis without evidence of acute cholecystitis. 6. Colonic diverticulosis without evidence of acute diverticulitis. 7. No acute findings within the abdomen or pelvis. No bowel obstruction or evidence of acute bowel wall inflammation. No free fluid or abscess collection. No evidence of acute solid organ abnormality. 8. No evidence of acute osseous fracture or dislocation is seen. Electronically Signed   By: Franki Cabot M.D.   On: 04/27/2021 17:09   CT Hip Left Wo Contrast  Result Date: 04/27/2021 CLINICAL DATA:  Hip trauma, fracture suspected. EXAM: CT OF THE LEFT HIP WITHOUT CONTRAST TECHNIQUE: Multidetector CT imaging of the left hip was performed according to the standard  protocol. Multiplanar CT image reconstructions were also generated. COMPARISON:  None. FINDINGS: No evidence of acute fracture or dislocation at the LEFT hip. 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 Deformity of the LEFT humeral head/neck is compatible with previous injury versus chronic deformity related to the overlying degenerative joint disease. Visualized osseous structures of the LEFT hemipelvis appear intact and normally aligned. Additional degenerative change noted at the LEFT SI joint and within the lower lumbar spine. Visualized soft tissues about the LEFT hip are unremarkable. IMPRESSION: 1. No evidence of acute fracture or dislocation at the LEFT hip. 2. 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 Electronically Signed   By: Franki Cabot M.D.   On: 04/27/2021 17:13   CT CHEST ABDOMEN PELVIS W CONTRAST  Result Date: 04/10/2021 CLINICAL DATA:  Metastatic left breast cancer, assess treatment response, ongoing chemotherapy EXAM: CT CHEST, ABDOMEN, AND PELVIS WITH CONTRAST TECHNIQUE: Multidetector CT imaging of the chest, abdomen and pelvis was performed following the standard protocol during bolus administration of intravenous contrast. CONTRAST:  131mL OMNIPAQUE IOHEXOL 300 MG/ML SOLN, additional oral enteric contrast COMPARISON:  01/06/2021 FINDINGS: CT CHEST FINDINGS Cardiovascular: Right chest port catheter. Aortic atherosclerosis. Normal heart size. No pericardial effusion. Mediastinum/Nodes: Numerous enlarged bilateral axillary, lower cervical, and mediastinal lymph nodes. Some nodes are slightly enlarged compared to prior examination, index left subpectoral lymph node measuring 2.7 x 1.5 cm, previously 2.4 x 1.3 cm when measured similarly, index lower left cervical/supraclavicular node measuring 2.8 x 2.0 cm, previously 2.7 x  1.7 cm when measured similarly (series 2, image 6). Others are not significantly changed,  for example in the right axilla and in the mediastinum. There is a soft tissue mass in the inferior left axilla measuring 4.0 x 4.0 cm, previously 3.7 x 3.6 cm (series 2, image 25). Thyroid gland, trachea, and esophagus demonstrate no significant findings. Lungs/Pleura: Mild, scarring and volume loss of the right lung base. No pleural effusion or pneumothorax. Musculoskeletal: Extensive skin thickening of the left breast, partially imaged. Significant interval sclerosis of a lytic lesion of the left aspect of the manubrium (series 2, image 14). More subtle interval sclerosis of a lytic lesion of the posterior aspect of T4 (series 5, image 102). CT ABDOMEN PELVIS FINDINGS Hepatobiliary: Multiple, very subtle hypodense liver lesions are again seen. These are very difficult to compared to immediate prior examination dated 01/06/2021 due to extensive streak artifact at that time, however they appear slightly enlarged compared to more remote prior examination dated 09/15/2020, index nodule in the peripheral right lobe of the liver, hepatic segment VI, measuring 2.3 x 1.5 cm, previously 1.2 x 1.1 cm (series 2, image 44). Additional index lesion in the peripheral liver dome, hepatic segment V/VIII measures 1.7 x 1.6 cm, previously 1.3 x 1.2 cm (series 2, image 32). Tiny gallstones in the dependent gallbladder. No gallbladder wall thickening, or biliary dilatation. Pancreas: Unremarkable. No pancreatic ductal dilatation or surrounding inflammatory changes. Spleen: Normal in size without significant abnormality. Adrenals/Urinary Tract: Adrenal glands are unremarkable. Kidneys are normal, without renal calculi, solid lesion, or hydronephrosis. Bladder is unremarkable. Stomach/Bowel: Stomach is within normal limits. Appendix appears normal. Unchanged circumferential thickening of the mid ascending colon with prominent subcentimeter  adjacent mesenteric lymph nodes (series 4, image 84). Descending and sigmoid diverticulosis. Vascular/Lymphatic: No significant vascular findings are present. Unchanged enlarged portacaval lymph node measuring 3.6 x 1.6 cm (series 2, image 54) Reproductive: Calcified uterine fibroids. Other: No abdominal wall hernia or abnormality. No abdominopelvic ascites. Musculoskeletal: No acute or significant osseous findings. IMPRESSION: 1. Numerous enlarged bilateral axillary, lower cervical, and mediastinal lymph nodes, stable to slightly enlarged, concerning for worsened nodal metastatic disease. 2. Soft tissue mass in the inferior left axilla slightly enlarged, measuring 4.0 x 4.0 cm, previously 3.7 x 3.6 cm. 3. Multiple, very subtle hypodense liver lesions are again seen. These are very difficult to compare to immediate prior examination dated 01/06/2021 due to extensive streak artifact at that time, however they appear enlarged compared to more remote prior examination dated 09/15/2020. 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 4. Slight interval sclerotic healing of osseous metastatic lesions of the manubrium and T4. No new osseous lesions noted. 5. Unchanged enlarged portacaval lymph node. 6. Unchanged skin thickening of the left breast, in keeping with known breast malignancy. 7. Unchanged circumferential wall thickening of the mid ascending colon, in keeping with known colon malignancy. 8. Cholelithiasis. Aortic Atherosclerosis (ICD10-I70.0). Electronically Signed   By: Delanna Ahmadi M.D.   On: 04/10/2021 10:58   DG CHEST PORT 1 VIEW  Result Date: 04/29/2021 CLINICAL DATA:  Leukocytosis, atrial tachycardia, history of breast cancer EXAM: PORTABLE CHEST 1 VIEW COMPARISON:  04/27/2021 FINDINGS: Right chest wall port catheter is unchanged. Persistent elevation of the right hemidiaphragm. No new consolidation or edema. No pleural effusion. Stable cardiomediastinal  contours. IMPRESSION: No acute process in the chest. Electronically Signed   By: Macy Mis M.D.   On: 04/29/2021 08:05   ECHOCARDIOGRAM COMPLETE  Result Date: 04/30/2021    ECHOCARDIOGRAM REPORT   Patient Name:   Crystal Vega Date of  Exam: 04/30/2021 Medical Rec #:  [id number]       Height:       63.0 in Accession #:    [id number]      Weight:       257.1 lb Date of Birth:  1950-10-16       BSA:          2.151 m Patient Age:    70 years        BP:           112/70 mmHg Patient Gender: F               HR:           84 bpm. Exam Location:  [facility] Procedure: 2D Echo, Cardiac Doppler and Color Doppler Indications:    Bacteremia  History:        Patient has prior history of Echocardiogram examinations, most                 recent 09/20/1942. Signs/Symptoms:Chest Pain; Risk                 Factors:Hypertension and Diabetes. Port-a-cath access only, no                 IV, Breast CA,.  Sonographer:    Wenda Low Referring Phys: [id number] COUR[employer] EMOKPAE  Sonographer Comments: Patient is morbidly obese and Technically difficult study due to poor echo windows. IMPRESSIONS  1. Left ventricular ejection fraction, by estimation, is 60 to 65%. The left ventricle has normal function. Left ventricular endocardial border not optimally defined to evaluate regional wall motion. There is moderate left ventricular hypertrophy. Left ventricular diastolic parameters are indeterminate.  2. Right ventricular systolic function is normal. The right ventricular size is mildly enlarged. There is normal pulmonary artery systolic pressure. The estimated right ventricular systolic pressure is 79.8 mmHg.  3. The mitral valve is grossly normal. Trivial mitral valve regurgitation.  4. The aortic valve is tricuspid. Aortic valve regurgitation is not visualized. Aortic valve sclerosis is present, with no evidence of aortic valve stenosis. Aortic valve mean gradient measures 3.0 mmHg.  5. The inferior vena cava is normal in size with  greater than 50% respiratory variability, suggesting right atrial pressure of 3 mmHg. Comparison(s): No significant change from prior study. Prior images reviewed side by side. FINDINGS  Left Ventricle: Left ventricular ejection fraction, by estimation, is 60 to 65%. The left ventricle has normal function. Left ventricular endocardial border not optimally defined to evaluate regional wall motion. The left ventricular internal cavity size was normal in size. There is moderate left ventricular hypertrophy. Left ventricular diastolic parameters are indeterminate. Right Ventricle: The right ventricular size is mildly enlarged. No increase in right ventricular wall thickness. Right ventricular systolic function is normal. There is normal pulmonary artery systolic pressure. The tricuspid regurgitant velocity is 2.64  m/s, and with an assumed right atrial pressure of 3 mmHg, the estimated right ventricular systolic pressure is 92.1 mmHg. Left Atrium: Left atrial size was normal in size. Right Atrium: Right atrial size was normal in size. Pericardium: There is no evidence of pericardial effusion. Mitral Valve: The mitral valve is grossly normal. Trivial mitral valve regurgitation. MV peak gradient, 3.7 mmHg. The mean mitral valve gradient is 2.0 mmHg. Tricuspid Valve: The tricuspid valve is grossly normal. Tricuspid valve regurgitation is mild. Aortic Valve: The aortic valve is tricuspid. There is mild aortic valve annular calcification. Aortic valve regurgitation is not visualized. Aortic valve sclerosis  is present, with no evidence of aortic valve stenosis. Aortic valve mean gradient measures  3.0 mmHg. Aortic valve peak gradient measures 4.9 mmHg. Aortic valve area, by VTI measures 2.32 cm. Pulmonic Valve: The pulmonic valve was grossly normal. Pulmonic valve regurgitation is trivial. Aorta: The aortic root is normal in size and structure. Venous: The inferior vena cava is normal in size with greater than 50%  respiratory variability, suggesting right atrial pressure of 3 mmHg. IAS/Shunts: No atrial level shunt detected by color flow Doppler.  LEFT VENTRICLE PLAX 2D LVIDd:         3.70 cm LVIDs:         2.50 cm LV PW:         1.40 cm LV IVS:        1.30 cm LVOT diam:     2.00 cm LV SV:         61 LV SV Index:   28 LVOT Area:     3.14 cm  RIGHT VENTRICLE RV Basal diam:  3.45 cm RV Mid diam:    3.70 cm RV S prime:     11.70 cm/s LEFT ATRIUM           Index        RIGHT ATRIUM           Index LA diam:      3.00 cm 1.39 cm/m   RA Area:     12.60 cm LA Vol (A4C): 26.8 ml 12.46 ml/m  RA Volume:   31.80 ml  14.78 ml/m  AORTIC VALVE                    PULMONIC VALVE AV Area (Vmax):    2.33 cm     PV Vmax:       0.86 m/s AV Area (Vmean):   2.06 cm     PV Peak grad:  3.0 mmHg AV Area (VTI):     2.32 cm AV Vmax:           111.00 cm/s AV Vmean:          81.400 cm/s AV VTI:            0.263 m AV Peak Grad:      4.9 mmHg AV Mean Grad:      3.0 mmHg LVOT Vmax:         82.20 cm/s LVOT Vmean:        53.400 cm/s LVOT VTI:          0.194 m LVOT/AV VTI ratio: 0.74  AORTA Ao Root diam: 3.10 cm Ao Asc diam:  3.10 cm MITRAL VALVE               TRICUSPID VALVE MV Area (PHT): 5.93 cm    TR Peak grad:   27.9 mmHg MV Area VTI:   2.83 cm    TR Vmax:        264.00 cm/s MV Peak grad:  3.7 mmHg MV Mean grad:  2.0 mmHg    SHUNTS MV Vmax:       0.96 m/s    Systemic VTI:  0.19 m MV Vmean:      67.4 cm/s   Systemic Diam: 2.00 cm MV Decel Time: 128 msec MV E velocity: 94.30 cm/s MV A velocity: 91.30 cm/s MV E/A ratio:  1.03 Rozann Lesches MD Electronically signed by Rozann Lesches MD Signature Date/Time: 04/30/2021/10:53:15 AM    Final    DG Hip  Unilat W or Wo Pelvis 2-3 Views Left  Result Date: 04/27/2021 CLINICAL DATA:  [place] 2 weeks ago.  Pain. EXAM: DG HIP (WITH OR WITHOUT PELVIS) 2-3V LEFT COMPARISON:  None. FINDINGS: There is advanced osteoarthritis of the left hip with joint space narrowing, sclerosis, osteophyte and flattening of  femoral head. No definite acute regional fracture. 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 IMPRESSION: Advanced osteoarthritis of the left hip. No visible traumatic finding. Flattening of the humeral head which is probably chronic. Electronically Signed   By: Nelson Chimes M.D.   On: 04/27/2021 13:17    Microbiology: Recent Results (from the past 240 hour(s))  Resp Panel by RT-PCR (Flu A&B, Covid) Nasopharyngeal Swab     Status: None   Collection Time: 04/27/21  6:07 PM   Specimen: Nasopharyngeal Swab; Nasopharyngeal(NP) swabs in vial transport medium  Result Value Ref Range Status   SARS Coronavirus 2 by RT PCR NEGATIVE NEGATIVE Final    Comment: (NOTE) SARS-CoV-2 target nucleic acids are NOT DETECTED.  The SARS-CoV-2 RNA is generally detectable in upper respiratory specimens during the acute phase of infection. The lowest concentration of SARS-CoV-2 viral copies this assay can detect is 138 copies/mL. A negative result does not preclude SARS-Cov-2 infection and should not be used as the sole basis for treatment or other patient management decisions. A negative result may occur with  improper specimen collection/handling, submission of specimen other than nasopharyngeal swab, presence of viral mutation(s) within the areas targeted by this assay, and inadequate number of viral copies(<138 copies/mL). A negative result must be combined with clinical observations, patient history, and epidemiological information. The expected result is Negative.  Fact Sheet for Patients:  EntrepreneurPulse.com.au  Fact Sheet for Healthcare Providers:  IncredibleEmployment.be  This test is no t yet approved or cleared by the [place] FDA and  has been authorized for detection and/or diagnosis of SARS-CoV-2 by FDA under an Emergency Use Authorization (EUA). This EUA will remain  in effect (meaning this test can be used) for the duration of  the COVID-19 declaration under Section 564(b)(1) of the Act, 21 U.S.C.section 360bbb-3(b)(1), unless the authorization is terminated  or revoked sooner.       Influenza A by PCR NEGATIVE NEGATIVE Final   Influenza B by PCR NEGATIVE NEGATIVE Final    Comment: (NOTE) The Xpert Xpress SARS-CoV-2/FLU/RSV plus assay is intended as an aid in the diagnosis of influenza from Nasopharyngeal swab specimens and should not be used as a sole basis for treatment. Nasal washings and aspirates are unacceptable for Xpert Xpress SARS-CoV-2/FLU/RSV testing.  Fact Sheet for Patients: EntrepreneurPulse.com.au  Fact Sheet for Healthcare Providers: IncredibleEmployment.be  This test is not yet approved or cleared by the [place] FDA and has been authorized for detection and/or diagnosis of SARS-CoV-2 by FDA under an Emergency Use Authorization (EUA). This EUA will remain in effect (meaning this test can be used) for the duration of the COVID-19 declaration under Section 564(b)(1) of the Act, 21 U.S.C. section 360bbb-3(b)(1), unless the authorization is terminated or revoked.  Performed at [facility], [address] [place], [place] [address]   Urine Culture     Status: Abnormal   Collection Time: 04/27/21  6:07 PM   Specimen: Urine, Clean Catch  Result Value Ref Range Status   Specimen Description   Final    URINE, CLEAN CATCH Performed at [facility], [address], [place], [place] [address]    Special Requests   Final  NONE Performed at [facility], [address], [place], [place] [address]    Culture (A)  Final    >=100,000 COLONIES/mL KLEBSIELLA PNEUMONIAE 50,000 COLONIES/mL PROTEUS MIRABILIS    Report Status 05/01/2021 FINAL  Final   Organism ID, Bacteria KLEBSIELLA PNEUMONIAE (A)  Final   Organism ID, Bacteria PROTEUS MIRABILIS (A)  Final      Susceptibility   Klebsiella pneumoniae - MIC*    AMPICILLIN >=32 RESISTANT Resistant      CEFAZOLIN <=4 SENSITIVE Sensitive     CEFEPIME <=0.12 SENSITIVE Sensitive     CEFTRIAXONE <=0.25 SENSITIVE Sensitive     CIPROFLOXACIN <=0.25 SENSITIVE Sensitive     GENTAMICIN <=1 SENSITIVE Sensitive     IMIPENEM <=0.25 SENSITIVE Sensitive     NITROFURANTOIN 64 INTERMEDIATE Intermediate     TRIMETH/SULFA <=20 SENSITIVE Sensitive     AMPICILLIN/SULBACTAM 4 SENSITIVE Sensitive     PIP/TAZO <=4 SENSITIVE Sensitive     * >=100,000 COLONIES/mL KLEBSIELLA PNEUMONIAE   Proteus mirabilis - MIC*    AMPICILLIN <=2 SENSITIVE Sensitive     CEFAZOLIN <=4 SENSITIVE Sensitive     CEFEPIME <=0.12 SENSITIVE Sensitive     CEFTRIAXONE <=0.25 SENSITIVE Sensitive     CIPROFLOXACIN <=0.25 SENSITIVE Sensitive     GENTAMICIN <=1 SENSITIVE Sensitive     IMIPENEM 2 SENSITIVE Sensitive     NITROFURANTOIN 128 RESISTANT Resistant     TRIMETH/SULFA <=20 SENSITIVE Sensitive     AMPICILLIN/SULBACTAM <=2 SENSITIVE Sensitive     PIP/TAZO <=4 SENSITIVE Sensitive     * 50,000 COLONIES/mL PROTEUS MIRABILIS  MRSA Next Gen by PCR, Nasal     Status: None   Collection Time: 04/27/21  9:31 PM   Specimen: Nasal Mucosa; Nasal Swab  Result Value Ref Range Status   MRSA by PCR Next Gen NOT DETECTED NOT DETECTED Final    Comment: (NOTE) The GeneXpert MRSA Assay (FDA approved for NASAL specimens only), is one component of a comprehensive MRSA colonization surveillance program. It is not intended to diagnose MRSA infection nor to guide or monitor treatment for MRSA infections. Test performance is not FDA approved in patients less than 61 years old. Performed at [facility], [address] [place], [place] [address]   Culture, blood (routine x 2)     Status: Abnormal   Collection Time: 04/28/21  7:49 AM   Specimen: BLOOD  Result Value Ref Range Status   Specimen Description   Final    BLOOD RIGHT PORT Performed at [facility], [address] [place], [place] [address]    Special Requests   Final    BOTTLES DRAWN  AEROBIC AND ANAEROBIC Blood Culture adequate volume Performed at [facility], [address], [place], [place] [address]    Culture  Setup Time   Final    GRAM POSITIVE COCCI Gram Stain Report Called to,Read Back By and Verified With: Caraballo,[email or web address]  by [email or web address], b 11.29.22 CRITICAL RESULT CALLED TO, READ BACK BY AND VERIFIED WITH: S ALSTON,[email or web address]  04/29/21 [place] IN BOTH AEROBIC AND ANAEROBIC BOTTLES    Culture (A)  Final    STREPTOCOCCUS MITIS/ORALIS STAPHYLOCOCCUS HOMINIS THE SIGNIFICANCE OF ISOLATING THIS ORGANISM FROM A SINGLE SET OF BLOOD CULTURES WHEN MULTIPLE SETS ARE DRAWN IS UNCERTAIN. PLEASE NOTIFY THE MICROBIOLOGY DEPARTMENT WITHIN ONE WEEK IF SPECIATION AND SENSITIVITIES ARE REQUIRED. Performed at [facility], [place] [address], [place], [place] [address]    Report Status 05/01/2021 FINAL  Final   Organism ID, Bacteria STREPTOCOCCUS MITIS/ORALIS  Final  Susceptibility   Streptococcus mitis/oralis - MIC*    TETRACYCLINE <=0.25 SENSITIVE Sensitive     VANCOMYCIN 0.5 SENSITIVE Sensitive     CLINDAMYCIN <=0.25 SENSITIVE Sensitive     PENICILLIN Value in next row Sensitive      SENSITIVE0.06    CEFTRIAXONE Value in next row Sensitive      SENSITIVE0.25    * STREPTOCOCCUS MITIS/ORALIS  Blood Culture ID Panel (Reflexed)     Status: Abnormal   Collection Time: 04/28/21  7:49 AM  Result Value Ref Range Status   Enterococcus faecalis NOT DETECTED NOT DETECTED Final   Enterococcus Faecium NOT DETECTED NOT DETECTED Final   Listeria monocytogenes NOT DETECTED NOT DETECTED Final   Staphylococcus species NOT DETECTED NOT DETECTED Final   Staphylococcus aureus (BCID) NOT DETECTED NOT DETECTED Final   Staphylococcus epidermidis NOT DETECTED NOT DETECTED Final   Staphylococcus lugdunensis NOT DETECTED NOT DETECTED Final   Streptococcus species DETECTED (A) NOT DETECTED Final    Comment: Not Enterococcus species, Streptococcus agalactiae, Streptococcus pyogenes, or Streptococcus  pneumoniae. CRITICAL RESULT CALLED TO, READ BACK BY AND VERIFIED WITH: S ALSTON,[email or web address]@0325  04/29/21 [place]    Streptococcus agalactiae NOT DETECTED NOT DETECTED Final   Streptococcus pneumoniae NOT DETECTED NOT DETECTED Final   Streptococcus pyogenes NOT DETECTED NOT DETECTED Final   A.calcoaceticus-baumannii NOT DETECTED NOT DETECTED Final   Bacteroides fragilis NOT DETECTED NOT DETECTED Final   Enterobacterales NOT DETECTED NOT DETECTED Final   Enterobacter cloacae complex NOT DETECTED NOT DETECTED Final   Escherichia coli NOT DETECTED NOT DETECTED Final   Klebsiella aerogenes NOT DETECTED NOT DETECTED Final   Klebsiella oxytoca NOT DETECTED NOT DETECTED Final   Klebsiella pneumoniae NOT DETECTED NOT DETECTED Final   Proteus species NOT DETECTED NOT DETECTED Final   Salmonella species NOT DETECTED NOT DETECTED Final   Serratia marcescens NOT DETECTED NOT DETECTED Final   Haemophilus influenzae NOT DETECTED NOT DETECTED Final   Neisseria meningitidis NOT DETECTED NOT DETECTED Final   Pseudomonas aeruginosa NOT DETECTED NOT DETECTED Final   Stenotrophomonas maltophilia NOT DETECTED NOT DETECTED Final   Candida albicans NOT DETECTED NOT DETECTED Final   Candida auris NOT DETECTED NOT DETECTED Final   Candida glabrata NOT DETECTED NOT DETECTED Final   Candida krusei NOT DETECTED NOT DETECTED Final   Candida parapsilosis NOT DETECTED NOT DETECTED Final   Candida tropicalis NOT DETECTED NOT DETECTED Final   Cryptococcus neoformans/gattii NOT DETECTED NOT DETECTED Final    Comment: Performed at [facility], [address] [place], [place] [address]  Culture, blood (routine x 2)     Status: Abnormal   Collection Time: 04/28/21  8:34 AM   Specimen: Right Antecubital; Blood  Result Value Ref Range Status   Specimen Description   Final    RIGHT ANTECUBITAL Performed at [facility], [address], [place], [place] [address]    Special Requests   Final    BOTTLES DRAWN AEROBIC AND  ANAEROBIC Blood Culture adequate volume Performed at [facility], [address] [place], [place] [address]    Culture  Setup Time   Final    GRAM POSITIVE COCCI anaerobic AND AEROBIC BOTTLEbottle Gram Stain Report Called to,Read Back By and Verified With: Caraballo,[email or web address]  by [email or web address],b 11.29.22 CRITICAL VALUE NOTED.  VALUE IS CONSISTENT WITH PREVIOUSLY REPORTED AND CALLED VALUE.    Culture (A)  Final    STREPTOCOCCUS MITIS/ORALIS SUSCEPTIBILITIES PERFORMED ON PREVIOUS CULTURE WITHIN THE LAST 5 DAYS. Performed at [facility], [address] [place], [place] [address]    Report Status 05/01/2021 FINAL  Final     Labs: Basic Metabolic Panel: Recent Labs  Lab 04/30/21 0459 05/01/21 0500 05/04/21 0429 05/06/21 0544  NA 130* 133* 131* 133*  K 4.2 4.1 4.0 3.8  CL 98 102 99 99  CO2 26 23 28 26   GLUCOSE 192* 129* 110* 108*  BUN 24* 23 15 15   CREATININE 0.51 0.51 0.41* 0.42*  CALCIUM 8.6* 8.2* 8.2* 8.3*   Liver Function Tests: Recent Labs  Lab 05/01/21 0500 05/06/21 0544  AST 30 25  ALT 13 16  ALKPHOS 93 116  BILITOT 0.4 0.3  PROT 5.9* 5.9*  ALBUMIN 2.0* 2.0*   CBC: Recent Labs  Lab 05/01/21 0500 05/02/21 0417 05/03/21 0525 05/04/21 0429 05/05/21 0935  WBC 10.5 7.8 7.4 8.6 9.6  HGB 7.3* 7.2* 7.1* 7.0* 7.3*  HCT 23.7* 23.4* 23.6* 22.5* 23.6*  MCV 91.5 92.9 93.7 94.9 94.4  PLT 56* 54* 54* 58* 70*    CBG: Recent Labs  Lab 05/05/21 0709 05/05/21 1116 05/05/21 1609 05/05/21 2113 05/06/21 0715  GLUCAP 117* 165* 164* 119* 61*   Signed:  Barton Dubois MD.  [facility] 05/06/2021, 8:36 AM

## 2021-05-06 NOTE — Progress Notes (Signed)
Report attempted to [facility] with no answer will try again later

## 2021-05-06 NOTE — Progress Notes (Signed)
Skin tare noted on left side of labia. MD notified orders to apply ointment was received.

## 2021-05-11 ENCOUNTER — Encounter (HOSPITAL_COMMUNITY): Payer: Self-pay | Admitting: *Deleted

## 2021-05-11 NOTE — Progress Notes (Signed)
Crystal Vega was contacted by telephone to verify understanding of discharge instructions status post their most recent discharge from the hospital on the date:  05/06/2021.  Spoke with Chelsie at [facility] regarding  cancer center appointment on 05/14/2021 @ 2:30.  Verification of understanding for oncology specific follow-up was validated using the Teach Back method.    Transportation to appointments were confirmed for the patient as being  [employer] .

## 2021-05-13 NOTE — Progress Notes (Shared)
Crystal Vega, [place] [id number]   CLINIC:  Medical Oncology/Hematology  PCP:  Lemmie Evens, MD [place]. / [place] [address] [phone]   REASON FOR VISIT:  Follow-up for ***  PRIOR THERAPY: ***  NGS Results: ***  CURRENT THERAPY: ***  BRIEF ONCOLOGIC HISTORY:  Oncology History  Malignant neoplasm of overlapping sites of left breast in female, estrogen receptor positive ([facility])  11/16/2019 Cancer Staging   Staging form: Breast, AJCC 8th Edition - Clinical stage from 11/16/2019: Stage IV (cT4b, cN1, cM1, G3, ER+, PR-, HER2-) - Signed by [name], MD on 12/17/2019    11/28/2019 Initial Diagnosis   Patient noted intermittent left breast pain and swelling for several months. Mammogram showed a 6.2cm mass in the left breast axillary tail with an adjacent 0.9cm mass, 2 abnormal left axillary lymph nodes, 1 abnormal right axillary lymph node, and diffuse left breast edema and skin thickening. Biopsy showed IDC, grade 3 in the left breast and bilateral axillas, HER-2 negative (1+), ER+ 100%, PR- 0%, Ki67 30%.    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 ([facility])   02/01/2021 Genetic Testing          CANCER STAGING: Cancer Staging  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, a 70 y.o. female, returns for routine follow-up of her ***. Crystal Vega was last seen on {XX/XX/XXXX}.    REVIEW OF SYSTEMS:  Review of Systems - Oncology  PAST MEDICAL/SURGICAL HISTORY:  Past Medical History:  Diagnosis Date   Anemia    Arthritis    per patient " left knee"   DVT (deep venous thrombosis) (HCC)    left leg   Dyspnea    Essential hypertension, benign    Family history of cancer of female genital organ    Family history of GI tract cancer    Metastatic breast cancer ([facility])    left breast   Port-A-Cath in place 12/25/2019   Type 2 diabetes mellitus [facility])    Past Surgical History:  Procedure Laterality Date   BIOPSY  11/27/2020   Procedure: BIOPSY;  Surgeon: Daneil Dolin, MD;  Location[facility];  Service: Endoscopy;;   CATARACT EXTRACTION W/ INTRAOCULAR LENS IMPLANT Right    COLONOSCOPY WITH PROPOFOL N/A 11/27/2020   Procedure: COLONOSCOPY WITH PROPOFOL;  Surgeon: Daneil Dolin, MD;  Location: [facility];  Service: Endoscopy;  Laterality: N/A;  9:00am   HYSTEROSCOPY WITH D & C N/A 06/05/2014   Procedure: DILATATION AND CURETTAGE /HYSTEROSCOPY;  Surgeon: Florian Buff, MD;  Location: [facility] ORS;  Service: Gynecology;  Laterality: N/A;   POLYPECTOMY N/A 06/05/2014   Procedure: ENDOMETRIAL POLYPECTOMY;  Surgeon: Florian Buff, MD;  Location: [facility] OR[facility];  Service: Gynecology;  Laterality: N/A;   PORTACATH PLACEMENT N/A 12/14/2019   Procedure: INSERTION PORT-A-CATH WITH ULTRASOUND GUIDANCE;  Surgeon: Donnie Mesa, MD;  Location: [facility];  Service: General;  Laterality: N/A;    TUBAL LIGATION      SOCIAL HISTORY:  Social History   Socioeconomic History   Marital status: Married    Spouse name: Herbie [place]   Number of children: 2   Years of education: Not on file   Highest education level: Not on file  Occupational History   Occupation: retired  Tobacco Use   Smoking status: Former    Types: Cigarettes   Smokeless tobacco: Never   Tobacco comments:    quit about 40+ years ago  Vaping Use   Vaping Use: Never used  Substance and Sexual Activity   Alcohol use: Never   Drug use: No   Sexual activity: Not on file  Other Topics Concern   Not on file  Social History Narrative   Not on file   Social Determinants of Health   Financial Resource Strain: Not on file  Food Insecurity: Not on file  Transportation Needs: Not on file  Physical Activity: Not on file  Stress: Not on file  Social Connections: Not on file  Intimate Partner Violence: Not on file    FAMILY HISTORY:  Family History  Problem Relation Age of Onset   Diabetes Mellitus II Father    Congestive Heart Failure Father    Colon cancer Mother 72       patient not sure if colon vs stomach   Stroke Maternal Grandmother    Cancer Cousin        female reproductive cancer, dx. 50s/60s    CURRENT MEDICATIONS:  Current Outpatient Medications  Medication Sig Dispense Refill   acetaminophen (TYLENOL) 325 MG tablet Take 2 tablets (650 mg total) by mouth every 6 (six) hours as needed for mild pain (or Fever >/= 101). 12 tablet 0   amoxicillin (AMOXIL) 500 MG capsule Take 2 capsules (1,000 mg total) by mouth 3 (three) times daily for 10 days. 60 capsule 0   apixaban (ELIQUIS) 5 MG TABS tablet Take 1 tablet (5 mg total) by mouth 2 (two) times daily. 60 tablet 3   Ensure Max Protein (ENSURE MAX PROTEIN) LIQD Take 330 mLs (11 oz total) by mouth daily. 330 mL 2   fentaNYL (DURAGESIC) 50 MCG/HR Place 1 patch onto the skin every 3 (three) days. 2 patch 0   ferrous sulfate 325 (65 FE) MG tablet Take  325 mg by mouth daily with breakfast.     folic acid (FOLVITE) 1 MG tablet TAKE 1 TABLET BY MOUTH EVERY DAY (Patient taking differently: Take 1 mg by mouth daily.) 90 tablet 1   gabapentin (NEURONTIN) 300 MG capsule Take 1 capsule (300 mg total) by mouth 3 (three) times daily. 90 capsule 3   HYDROmorphone (DILAUDID) 2 MG tablet Take 1 tablet (2 mg total) by mouth every 4 (four) hours as needed for severe pain. 15 tablet 0   KLOR-CON M10 10 MEQ tablet Take 10  mEq by mouth daily.     lidocaine (LIDODERM) 5 % Place 1 patch onto the skin daily. Remove & Discard patch within 12 hours or as directed by MD 30 patch 0   lidocaine-prilocaine (EMLA) cream Apply 1 application topically as needed. 30 g 3   losartan (COZAAR) 50 MG tablet Take 50 mg by mouth daily.     metFORMIN (GLUCOPHAGE) 500 MG tablet Take 500 mg by mouth 2 (two) times daily with a meal.     metoprolol succinate (TOPROL XL) 25 MG 24 hr tablet Take 1 tablet (25 mg total) by mouth daily. 30 tablet 11   nutrition supplement, JUVEN, (JUVEN) PACK Take 1 packet by mouth 2 (two) times daily between meals. 30 packet 2   Nystatin (GERHARDT'S BUTT CREAM) CREA Apply 1 application topically 3 (three) times daily. 30 each 1   ondansetron (ZOFRAN) 4 MG tablet Take 1 tablet (4 mg total) by mouth daily as needed for nausea or vomiting. 30 tablet 1   polyethylene glycol (MIRALAX / GLYCOLAX) 17 g packet Take 17 g by mouth 2 (two) times daily. 60 each 2   pravastatin (PRAVACHOL) 10 MG tablet Take 10 mg by mouth daily.      senna-docusate (SENOKOT-S) 8.6-50 MG tablet Take 2 tablets by mouth at bedtime. 60 tablet 1   No current facility-administered medications for this visit.    ALLERGIES:  No Known Allergies  PHYSICAL EXAM:  Performance status (ECOG): {CHL ONC TJ:[id number]}  There were no vitals filed for this visit. Wt Readings from Last 3 Encounters:  04/30/21 260 lb 12.9 oz (118.3 kg)  03/31/21 256 lb 6.4 oz (116.3 kg)  03/24/21 264 lb 9.6 oz  (120 kg)   Physical Exam   LABORATORY DATA:  I have reviewed the labs as listed.  CBC Latest Ref Rng & Units 05/05/2021 05/04/2021 05/03/2021  WBC 4.0 - 10.5 K/uL 9.6 8.6 7.4  Hemoglobin 12.0 - 15.0 g/dL 7.3(L) 7.0(L) 7.1(L)  Hematocrit 36.0 - 46.0 % 23.6(L) 22.5(L) 23.6(L)  Platelets 150 - 400 K/uL 70(L) 58(L) 54(L)   CMP Latest Ref Rng & Units 05/06/2021 05/04/2021 05/01/2021  Glucose 70 - 99 mg/dL 108(H) 110(H) 129(H)  BUN 8 - 23 mg/dL _0 Creatinine 0.44 - 1.00 mg/dL 0.42(L) 0.41(L) 0.51  Sodium 135 - 145 mmol/L 133(L) 131(L) 133(L)  Potassium 3.5 - 5.1 mmol/L 3.8 4.0 4.1  Chloride 98 - 111 mmol/L 99 99 102  CO2 22 - 32 mmol/L _1 Calcium 8.9 - 10.3 mg/dL 8.3(L) 8.2(L) 8.2(L)  Total Protein 6.5 - 8.1 g/dL 5.9(L) - 5.9(L)  Total Bilirubin 0.3 - 1.2 mg/dL 0.3 - 0.4  Alkaline Phos 38 - 126 U/L 116 - 93  AST 15 - 41 U/L 25 - 30  ALT 0 - 44 U/L 16 - 13    DIAGNOSTIC IMAGING:  I have independently reviewed the scans and discussed with the patient. DG Chest 1 View  Result Date: 04/27/2021 CLINICAL DATA:  [place] 2 weeks ago EXAM: CHEST  1 VIEW COMPARISON:  07/28/2020 FINDINGS: Power port on the right with the tip in the right atrium, possibly approaching the tricuspid valve. No evidence of heart failure. Patient has taken a poor inspiration. No traumatic regional finding. IMPRESSION: Poor inspiration. No traumatic finding. Power port tip in the right atrium, possibly near the tricuspid valve. Electronically Signed   By: Nelson Chimes M.D.   On: 04/27/2021 13:18   DG Lumbar Spine 2-3 Views  Result  Date: 04/29/2021 CLINICAL DATA:  Low back and left hip pain EXAM: LUMBAR SPINE - 2-3 VIEW COMPARISON:  CT 04/27/2021 FINDINGS: Limited resolution. Five lumbar type vertebral bodies do not show any visible malalignment. There is degenerative disc disease and degenerative facet disease throughout the lumbar region. Bilateral sacroiliac degenerated changes noted. Advanced chronic  arthropathy of the hips, left worse than right. IMPRESSION: Limited imaging. 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 Bilateral sacroiliac arthritis. Electronically Signed   By: Nelson Chimes M.D.   On: 04/29/2021 09:52   DG Knee 1-2 Views Left  Result Date: 04/27/2021 CLINICAL DATA:  [place] 2 weeks ago.  Pain and limited range of motion. EXAM: LEFT KNEE - 1-2 VIEW COMPARISON:  None. FINDINGS: There is tricompartmental osteoarthritis with a joint effusion. No evidence of fracture or focal bone lesion. IMPRESSION: Osteoarthritis and joint effusion. No traumatic bone finding visible. Electronically Signed   By: Nelson Chimes M.D.   On: 04/27/2021 13:16   CT Angio Chest PE W and/or Wo Contrast  Result Date: 04/27/2021 CLINICAL DATA:  PE suspected, high probability. LEFT leg pain and weakness status post fall 2 weeks ago. History of metastatic LEFT breast cancer, per clinical data provided on previous CT report. EXAM: CT ANGIOGRAPHY CHEST CT ABDOMEN AND PELVIS WITH CONTRAST TECHNIQUE: Multidetector CT imaging of the chest was performed using the standard protocol during bolus administration of intravenous contrast. Multiplanar CT image reconstructions and MIPs were obtained to evaluate the vascular anatomy. Multidetector CT imaging of the abdomen and pelvis was performed using the standard protocol during bolus administration of intravenous contrast. CONTRAST:  133m OMNIPAQUE IOHEXOL 350 MG/ML SOLN COMPARISON:  CT chest abdomen and pelvis dated 04/09/2021. FINDINGS: CTA CHEST FINDINGS Cardiovascular: Some of the most peripheral subsegmental pulmonary arteries are difficult to definitively characterize due to patient breathing motion artifact, however, there is no pulmonary embolism seen within the main, lobar or central segmental pulmonary arteries bilaterally. No thoracic aortic aneurysm or evidence of aortic dissection. No pericardial effusion. Mediastinum/Nodes:  Mediastinal and perihilar lymphadenopathy, including a 1.2 cm short axis lymph node within the anterior mediastinum and a 1.6 cm short axis lymph node within the RIGHT lower paratracheal mediastinum, not significantly changed compared to the recent chest CT of 04/09/2021, compatible with metastatic lymphadenopathy. Additional conglomerate lymphadenopathy is also again seen within the LEFT supraclavicular region, incompletely imaged. z Esophagus is unremarkable. Trachea and central bronchi are unremarkable. Lungs/Pleura: Lungs are clear.  No pleural effusion or pneumothorax. Musculoskeletal: No acute appearing osseous abnormality. Lytic-appearing lesion within the T6 vertebral body is suspicious for osseous metastasis. Additional destructive/lytic changes within the sternum, also suggesting metastatic disease. Conglomerate lymphadenopathy within the LEFT axilla, and mass/lymphadenopathy in the axillary tail region of the LEFT breast measures 4 cm greatest dimension, all of which is not significantly changed compared to the recent chest CT of 04/09/2021. Additional milder lymphadenopathy within the RIGHT axilla is redemonstrated and stable. Review of the MIP images confirms the above findings. CT ABDOMEN and PELVIS FINDINGS Hepatobiliary: Numerous small hypodense lesions within the bilateral liver lobes, as also described on the earlier CT abdomen report of 12/12/2019, presumably numerous small liver metastases. Single stone within the otherwise normal-appearing gallbladder. No bile duct dilatation is seen. Pancreas: Unremarkable. No pancreatic ductal dilatation or surrounding inflammatory changes. Spleen: Normal in size without focal abnormality. Adrenals/Urinary Tract: Adrenals are unremarkable. Bilateral renal cysts. Kidneys are otherwise unremarkable without suspicious mass, stone or hydronephrosis. No ureteral or bladder calculi are identified.  Bladder is unremarkable. Stomach/Bowel: No dilated large or small  bowel loops. Scattered diverticulosis of the descending and sigmoid colon but no focal inflammatory change to suggest acute diverticulitis. No evidence of acute bowel wall inflammation. Stomach is unremarkable. Appendix is normal. Vascular/Lymphatic: Vascular structures of the abdomen and pelvis are unremarkable. Conglomerate lymphadenopathy above the pancreatic head and adjacent to the porta hepatis, as previously described, presumed lymph node metastases. Reproductive: Calcified uterine fibroids. No adnexal mass or free fluid. Other: No free fluid or abscess collection. No free intraperitoneal air. Musculoskeletal: No acute findings. Degenerative spondylosis of the lumbar spine, mild to moderate in degree. 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 Review of the MIP images confirms the above findings. IMPRESSION: 1. No acute findings within the chest. No pulmonary embolism is seen, with mild study limitations detailed above. No evidence of pneumonia or pulmonary edema. 2. Metastatic lymphadenopathy is redemonstrated within the mediastinum, bilateral axillae, and supraclavicular LEFT neck. Also redemonstrated is the mass versus conglomerate lymphadenopathy in the axillary tail region of the LEFT breast. These findings are stable compared to the recent chest CT of 04/09/2021. 3. Lytic-appearing lesion within the T6 vertebral body, and destructive/lytic changes within the sternum, suspicious for metastatic osseous disease. Consider nuclear medicine bone scan or PET scan for confirmation. 4. Numerous small hypodense lesions within the bilateral liver lobes, as also described on the earlier CT abdomen report of 12/12/2019, presumably liver metastases. 5. Cholelithiasis without evidence of acute cholecystitis. 6. Colonic diverticulosis without evidence of acute diverticulitis. 7. No acute findings within the abdomen or pelvis. No bowel obstruction or evidence of acute bowel wall inflammation. No free fluid or abscess collection. No  evidence of acute solid organ abnormality. 8. No evidence of acute osseous fracture or dislocation is seen. Electronically Signed   By: Franki Cabot M.D.   On: 04/27/2021 17:09   CT Knee Left Wo Contrast  Result Date: 04/27/2021 CLINICAL DATA:  Fall 2 weeks ago comment limited range of motion EXAM: CT OF THE LEFT KNEE WITHOUT CONTRAST TECHNIQUE: Multidetector CT imaging of the left knee was performed according to the standard protocol. Multiplanar CT image reconstructions were also generated. COMPARISON:  Radiographs 04/27/2021 FINDINGS: Bones/Joint/Cartilage The knee was imaged in a moderately flexed orientation. There is marked proliferative spurring all 3 compartments with associated cortical irregularity which can reduce sensitivity for subtle fractures. I do not see a well-defined cortical discontinuity to indicate fracture. There is a moderate knee effusion in the suprapatellar bursa. Ligaments Suboptimally assessed by CT. Muscles and Tendons Mild regional muscular atrophy diffusely. Soft tissues Atherosclerosis. Subcutaneous edema anterior to the patella, cannot exclude prepatellar bruising. IMPRESSION: 1. 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 No fracture is identified, although sensitivity for subtle fractures is reduced due to the degree of spurring and associated cortical irregularities. 2. Moderate knee effusion. 3. Atherosclerosis. 4. Subcutaneous edema anterior to the patella. Electronically Signed   By: Van Clines M.D.   On: 04/27/2021 17:15   CT ABDOMEN PELVIS W CONTRAST  Result Date: 04/27/2021 CLINICAL DATA:  PE suspected, high probability. LEFT leg pain and weakness status post fall 2 weeks ago. History of metastatic LEFT breast cancer, per clinical data provided on previous CT report. EXAM: CT ANGIOGRAPHY CHEST CT ABDOMEN AND PELVIS WITH CONTRAST TECHNIQUE: Multidetector CT imaging of the chest was performed using the standard protocol during bolus  administration of intravenous contrast. Multiplanar CT image reconstructions and MIPs were obtained to evaluate the vascular anatomy. Multidetector CT imaging of the  abdomen and pelvis was performed using the standard protocol during bolus administration of intravenous contrast. CONTRAST:  137m OMNIPAQUE IOHEXOL 350 MG/ML SOLN COMPARISON:  CT chest abdomen and pelvis dated 04/09/2021. FINDINGS: CTA CHEST FINDINGS Cardiovascular: Some of the most peripheral subsegmental pulmonary arteries are difficult to definitively characterize due to patient breathing motion artifact, however, there is no pulmonary embolism seen within the main, lobar or central segmental pulmonary arteries bilaterally. No thoracic aortic aneurysm or evidence of aortic dissection. No pericardial effusion. Mediastinum/Nodes: Mediastinal and perihilar lymphadenopathy, including a 1.2 cm short axis lymph node within the anterior mediastinum and a 1.6 cm short axis lymph node within the RIGHT lower paratracheal mediastinum, not significantly changed compared to the recent chest CT of 04/09/2021, compatible with metastatic lymphadenopathy. Additional conglomerate lymphadenopathy is also again seen within the LEFT supraclavicular region, incompletely imaged. z Esophagus is unremarkable. Trachea and central bronchi are unremarkable. Lungs/Pleura: Lungs are clear.  No pleural effusion or pneumothorax. Musculoskeletal: No acute appearing osseous abnormality. Lytic-appearing lesion within the T6 vertebral body is suspicious for osseous metastasis. Additional destructive/lytic changes within the sternum, also suggesting metastatic disease. Conglomerate lymphadenopathy within the LEFT axilla, and mass/lymphadenopathy in the axillary tail region of the LEFT breast measures 4 cm greatest dimension, all of which is not significantly changed compared to the recent chest CT of 04/09/2021. Additional milder lymphadenopathy within the RIGHT axilla is  redemonstrated and stable. Review of the MIP images confirms the above findings. CT ABDOMEN and PELVIS FINDINGS Hepatobiliary: Numerous small hypodense lesions within the bilateral liver lobes, as also described on the earlier CT abdomen report of 12/12/2019, presumably numerous small liver metastases. Single stone within the otherwise normal-appearing gallbladder. No bile duct dilatation is seen. Pancreas: Unremarkable. No pancreatic ductal dilatation or surrounding inflammatory changes. Spleen: Normal in size without focal abnormality. Adrenals/Urinary Tract: Adrenals are unremarkable. Bilateral renal cysts. Kidneys are otherwise unremarkable without suspicious mass, stone or hydronephrosis. No ureteral or bladder calculi are identified. Bladder is unremarkable. Stomach/Bowel: No dilated large or small bowel loops. Scattered diverticulosis of the descending and sigmoid colon but no focal inflammatory change to suggest acute diverticulitis. No evidence of acute bowel wall inflammation. Stomach is unremarkable. Appendix is normal. Vascular/Lymphatic: Vascular structures of the abdomen and pelvis are unremarkable. Conglomerate lymphadenopathy above the pancreatic head and adjacent to the porta hepatis, as previously described, presumed lymph node metastases. Reproductive: Calcified uterine fibroids. No adnexal mass or free fluid. Other: No free fluid or abscess collection. No free intraperitoneal air. Musculoskeletal: No acute findings. Degenerative spondylosis of the lumbar spine, mild to moderate in degree. 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 Review of the MIP images confirms the above findings. IMPRESSION: 1. No acute findings within the chest. No pulmonary embolism is seen, with mild study limitations detailed above. No evidence of pneumonia or pulmonary edema. 2. Metastatic lymphadenopathy is redemonstrated within the mediastinum, bilateral axillae, and supraclavicular LEFT neck. Also redemonstrated is the mass versus  conglomerate lymphadenopathy in the axillary tail region of the LEFT breast. These findings are stable compared to the recent chest CT of 04/09/2021. 3. Lytic-appearing lesion within the T6 vertebral body, and destructive/lytic changes within the sternum, suspicious for metastatic osseous disease. Consider nuclear medicine bone scan or PET scan for confirmation. 4. Numerous small hypodense lesions within the bilateral liver lobes, as also described on the earlier CT abdomen report of 12/12/2019, presumably liver metastases. 5. Cholelithiasis without evidence of acute cholecystitis. 6. Colonic diverticulosis without evidence of acute diverticulitis. 7. No acute findings  within the abdomen or pelvis. No bowel obstruction or evidence of acute bowel wall inflammation. No free fluid or abscess collection. No evidence of acute solid organ abnormality. 8. No evidence of acute osseous fracture or dislocation is seen. Electronically Signed   By: Franki Cabot M.D.   On: 04/27/2021 17:09   CT Hip Left Wo Contrast  Result Date: 04/27/2021 CLINICAL DATA:  Hip trauma, fracture suspected. EXAM: CT OF THE LEFT HIP WITHOUT CONTRAST TECHNIQUE: Multidetector CT imaging of the left hip was performed according to the standard protocol. Multiplanar CT image reconstructions were also generated. COMPARISON:  None. FINDINGS: No evidence of acute fracture or dislocation at the LEFT hip. 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 Deformity of the LEFT humeral head/neck is compatible with previous injury versus chronic deformity related to the overlying degenerative joint disease. Visualized osseous structures of the LEFT hemipelvis appear intact and normally aligned. Additional degenerative change noted at the LEFT SI joint and within the lower lumbar spine. Visualized soft tissues  about the LEFT hip are unremarkable. IMPRESSION: 1. No evidence of acute fracture or dislocation at the LEFT hip. 2. 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 Electronically Signed   By: Franki Cabot M.D.   On: 04/27/2021 17:13   DG CHEST PORT 1 VIEW  Result Date: 04/29/2021 CLINICAL DATA:  Leukocytosis, atrial tachycardia, history of breast cancer EXAM: PORTABLE CHEST 1 VIEW COMPARISON:  04/27/2021 FINDINGS: Right chest wall port catheter is unchanged. Persistent elevation of the right hemidiaphragm. No new consolidation or edema. No pleural effusion. Stable cardiomediastinal contours. IMPRESSION: No acute process in the chest. Electronically Signed   By: Macy Mis M.D.   On: 04/29/2021 08:05   ECHOCARDIOGRAM COMPLETE  Result Date: 04/30/2021    ECHOCARDIOGRAM REPORT   Patient Name:   Crystal Vega Date of Exam: 04/30/2021 Medical Rec #:  [id number]       Height:       63.0 in Accession #:    [id number]      Weight:       257.1 lb Date of Birth:  02/17/1951       BSA:          2.151 m Patient Age:    71 years        BP:           112/70 mmHg Patient Gender: F               HR:           84 bpm. Exam Location:  [facility] Procedure: 2D Echo, Cardiac Doppler and Color Doppler Indications:    Bacteremia  History:        Patient has prior history of Echocardiogram examinations, most                 recent 09/20/1942. Signs/Symptoms:Chest Pain; Risk                 Factors:Hypertension and Diabetes. Port-a-cath access only, no                 IV, Breast CA,.  Sonographer:    Wenda Low Referring Phys: [id number] COURAGE EMOKPAE  Sonographer Comments: Patient is morbidly obese and Technically difficult study due to poor echo windows. IMPRESSIONS  1. Left ventricular ejection fraction, by estimation, is 60 to 65%.  The left ventricle has normal function. Left ventricular endocardial border not optimally defined to evaluate regional wall motion. There is moderate left ventricular hypertrophy.  Left ventricular diastolic parameters are indeterminate.  2. Right ventricular systolic function is normal. The right ventricular size is mildly enlarged. There is normal pulmonary artery systolic pressure. The estimated right ventricular systolic pressure is 86.1 mmHg.  3. The mitral valve is grossly normal. Trivial mitral valve regurgitation.  4. The aortic valve is tricuspid. Aortic valve regurgitation is not visualized. Aortic valve sclerosis is present, with no evidence of aortic valve stenosis. Aortic valve mean gradient measures 3.0 mmHg.  5. The inferior vena cava is normal in size with greater than 50% respiratory variability, suggesting right atrial pressure of 3 mmHg. Comparison(s): No significant change from prior study. Prior images reviewed side by side. FINDINGS  Left Ventricle: Left ventricular ejection fraction, by estimation, is 60 to 65%. The left ventricle has normal function. Left ventricular endocardial border not optimally defined to evaluate regional wall motion. The left ventricular internal cavity size was normal in size. There is moderate left ventricular hypertrophy. Left ventricular diastolic parameters are indeterminate. Right Ventricle: The right ventricular size is mildly enlarged. No increase in right ventricular wall thickness. Right ventricular systolic function is normal. There is normal pulmonary artery systolic pressure. The tricuspid regurgitant velocity is 2.64  m/s, and with an assumed right atrial pressure of 3 mmHg, the estimated right ventricular systolic pressure is 68.3 mmHg. Left Atrium: Left atrial size was normal in size. Right Atrium: Right atrial size was normal in size. Pericardium: There is no evidence of pericardial effusion. Mitral Valve: The mitral valve is grossly normal. Trivial mitral valve regurgitation. MV peak gradient, 3.7 mmHg. The mean mitral valve gradient is 2.0 mmHg. Tricuspid Valve: The tricuspid valve is grossly normal. Tricuspid valve  regurgitation is mild. Aortic Valve: The aortic valve is tricuspid. There is mild aortic valve annular calcification. Aortic valve regurgitation is not visualized. Aortic valve sclerosis is present, with no evidence of aortic valve stenosis. Aortic valve mean gradient measures  3.0 mmHg. Aortic valve peak gradient measures 4.9 mmHg. Aortic valve area, by VTI measures 2.32 cm. Pulmonic Valve: The pulmonic valve was grossly normal. Pulmonic valve regurgitation is trivial. Aorta: The aortic root is normal in size and structure. Venous: The inferior vena cava is normal in size with greater than 50% respiratory variability, suggesting right atrial pressure of 3 mmHg. IAS/Shunts: No atrial level shunt detected by color flow Doppler.  LEFT VENTRICLE PLAX 2D LVIDd:         3.70 cm LVIDs:         2.50 cm LV PW:         1.40 cm LV IVS:        1.30 cm LVOT diam:     2.00 cm LV SV:         61 LV SV Index:   28 LVOT Area:     3.14 cm  RIGHT VENTRICLE RV Basal diam:  3.45 cm RV Mid diam:    3.70 cm RV S prime:     11.70 cm/s LEFT ATRIUM           Index        RIGHT ATRIUM           Index LA diam:      3.00 cm 1.39 cm/m   RA Area:     12.60 cm LA Vol (A4C): 26.8 ml 12.46 ml/m  RA Volume:  31.80 ml  14.78 ml/m  AORTIC VALVE                    PULMONIC VALVE AV Area (Vmax):    2.33 cm     PV Vmax:       0.86 m/s AV Area (Vmean):   2.06 cm     PV Peak grad:  3.0 mmHg AV Area (VTI):     2.32 cm AV Vmax:           111.00 cm/s AV Vmean:          81.400 cm/s AV VTI:            0.263 m AV Peak Grad:      4.9 mmHg AV Mean Grad:      3.0 mmHg LVOT Vmax:         82.20 cm/s LVOT Vmean:        53.400 cm/s LVOT VTI:          0.194 m LVOT/AV VTI ratio: 0.74  AORTA Ao Root diam: 3.10 cm Ao Asc diam:  3.10 cm MITRAL VALVE               TRICUSPID VALVE MV Area (PHT): 5.93 cm    TR Peak grad:   27.9 mmHg MV Area VTI:   2.83 cm    TR Vmax:        264.00 cm/s MV Peak grad:  3.7 mmHg MV Mean grad:  2.0 mmHg    SHUNTS MV Vmax:       0.96 m/s     Systemic VTI:  0.19 m MV Vmean:      67.4 cm/s   Systemic Diam: 2.00 cm MV Decel Time: 128 msec MV E velocity: 94.30 cm/s MV A velocity: 91.30 cm/s MV E/A ratio:  1.03 Rozann Lesches MD Electronically signed by Rozann Lesches MD Signature Date/Time: 04/30/2021/10:53:15 AM    Final    DG Hip Unilat W or Wo Pelvis 2-3 Views Left  Result Date: 04/27/2021 CLINICAL DATA:  [place] 2 weeks ago.  Pain. EXAM: DG HIP (WITH OR WITHOUT PELVIS) 2-3V LEFT COMPARISON:  None. FINDINGS: There is advanced osteoarthritis of the left hip with joint space narrowing, sclerosis, osteophyte and flattening of femoral head. No definite acute regional fracture. 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 IMPRESSION: Advanced osteoarthritis of the left hip. No visible traumatic finding. Flattening of the humeral head which is probably chronic. Electronically Signed   By: Nelson Chimes M.D.   On: 04/27/2021 13:17     ASSESSMENT:  ***   PLAN:  ***   Orders placed this encounter:  No orders of the defined types were placed in this encounter.    [name], MD [facility] [phone]   I, ***, am acting as a scribe for Dr. [name].  {Add Barista Statement}

## 2021-05-14 ENCOUNTER — Ambulatory Visit (HOSPITAL_COMMUNITY): Payer: Medicare HMO | Admitting: Hematology

## 2021-05-15 ENCOUNTER — Inpatient Hospital Stay (HOSPITAL_COMMUNITY)
Admission: EM | Admit: 2021-05-15 | Discharge: 2021-05-26 | DRG: 853 | Disposition: A | Discharge status: Skilled Nursing Facility | Payer: Medicare HMO | Attending: Internal Medicine | Admitting: Internal Medicine

## 2021-05-15 ENCOUNTER — Encounter (HOSPITAL_COMMUNITY): Payer: Self-pay | Admitting: *Deleted

## 2021-05-15 ENCOUNTER — Other Ambulatory Visit: Payer: Self-pay

## 2021-05-15 DIAGNOSIS — Z6841 Body Mass Index (BMI) 40.0 and over, adult: Secondary | ICD-10-CM | POA: Diagnosis not present

## 2021-05-15 DIAGNOSIS — R000 Tachycardia, unspecified: Secondary | ICD-10-CM | POA: Diagnosis not present

## 2021-05-15 DIAGNOSIS — R6521 Severe sepsis with septic shock: Secondary | ICD-10-CM | POA: Diagnosis not present

## 2021-05-15 DIAGNOSIS — K921 Melena: Secondary | ICD-10-CM | POA: Diagnosis not present

## 2021-05-15 DIAGNOSIS — I10 Essential (primary) hypertension: Secondary | ICD-10-CM | POA: Diagnosis present

## 2021-05-15 DIAGNOSIS — N179 Acute kidney failure, unspecified: Secondary | ICD-10-CM | POA: Diagnosis not present

## 2021-05-15 DIAGNOSIS — E785 Hyperlipidemia, unspecified: Secondary | ICD-10-CM | POA: Diagnosis present

## 2021-05-15 DIAGNOSIS — Z5320 Procedure and treatment not carried out because of patient's decision for unspecified reasons: Secondary | ICD-10-CM | POA: Diagnosis not present

## 2021-05-15 DIAGNOSIS — D696 Thrombocytopenia, unspecified: Secondary | ICD-10-CM | POA: Diagnosis present

## 2021-05-15 DIAGNOSIS — M25562 Pain in left knee: Secondary | ICD-10-CM

## 2021-05-15 DIAGNOSIS — E1165 Type 2 diabetes mellitus with hyperglycemia: Secondary | ICD-10-CM

## 2021-05-15 DIAGNOSIS — Z95828 Presence of other vascular implants and grafts: Secondary | ICD-10-CM | POA: Diagnosis not present

## 2021-05-15 DIAGNOSIS — N939 Abnormal uterine and vaginal bleeding, unspecified: Secondary | ICD-10-CM | POA: Diagnosis not present

## 2021-05-15 DIAGNOSIS — M25462 Effusion, left knee: Secondary | ICD-10-CM | POA: Diagnosis not present

## 2021-05-15 DIAGNOSIS — R7881 Bacteremia: Secondary | ICD-10-CM | POA: Diagnosis not present

## 2021-05-15 DIAGNOSIS — C189 Malignant neoplasm of colon, unspecified: Secondary | ICD-10-CM | POA: Diagnosis present

## 2021-05-15 DIAGNOSIS — Z823 Family history of stroke: Secondary | ICD-10-CM

## 2021-05-15 DIAGNOSIS — A4151 Sepsis due to Escherichia coli [E. coli]: Secondary | ICD-10-CM | POA: Diagnosis present

## 2021-05-15 DIAGNOSIS — D649 Anemia, unspecified: Secondary | ICD-10-CM

## 2021-05-15 DIAGNOSIS — Z7189 Other specified counseling: Secondary | ICD-10-CM | POA: Diagnosis not present

## 2021-05-15 DIAGNOSIS — Z66 Do not resuscitate: Secondary | ICD-10-CM | POA: Diagnosis present

## 2021-05-15 DIAGNOSIS — R52 Pain, unspecified: Secondary | ICD-10-CM | POA: Diagnosis not present

## 2021-05-15 DIAGNOSIS — N950 Postmenopausal bleeding: Secondary | ICD-10-CM

## 2021-05-15 DIAGNOSIS — Z79899 Other long term (current) drug therapy: Secondary | ICD-10-CM

## 2021-05-15 DIAGNOSIS — E871 Hypo-osmolality and hyponatremia: Secondary | ICD-10-CM | POA: Diagnosis present

## 2021-05-15 DIAGNOSIS — A419 Sepsis, unspecified organism: Secondary | ICD-10-CM | POA: Diagnosis not present

## 2021-05-15 DIAGNOSIS — C50912 Malignant neoplasm of unspecified site of left female breast: Secondary | ICD-10-CM | POA: Diagnosis present

## 2021-05-15 DIAGNOSIS — U071 COVID-19: Secondary | ICD-10-CM

## 2021-05-15 DIAGNOSIS — K219 Gastro-esophageal reflux disease without esophagitis: Secondary | ICD-10-CM | POA: Diagnosis present

## 2021-05-15 DIAGNOSIS — R571 Hypovolemic shock: Secondary | ICD-10-CM | POA: Diagnosis not present

## 2021-05-15 DIAGNOSIS — C787 Secondary malignant neoplasm of liver and intrahepatic bile duct: Secondary | ICD-10-CM | POA: Diagnosis present

## 2021-05-15 DIAGNOSIS — R579 Shock, unspecified: Secondary | ICD-10-CM | POA: Diagnosis not present

## 2021-05-15 DIAGNOSIS — Z86718 Personal history of other venous thrombosis and embolism: Secondary | ICD-10-CM | POA: Diagnosis not present

## 2021-05-15 DIAGNOSIS — L89892 Pressure ulcer of other site, stage 2: Secondary | ICD-10-CM | POA: Diagnosis present

## 2021-05-15 DIAGNOSIS — E861 Hypovolemia: Secondary | ICD-10-CM | POA: Diagnosis present

## 2021-05-15 DIAGNOSIS — Z833 Family history of diabetes mellitus: Secondary | ICD-10-CM

## 2021-05-15 DIAGNOSIS — Z170 Estrogen receptor positive status [ER+]: Secondary | ICD-10-CM

## 2021-05-15 DIAGNOSIS — L89312 Pressure ulcer of right buttock, stage 2: Secondary | ICD-10-CM | POA: Diagnosis present

## 2021-05-15 DIAGNOSIS — Z515 Encounter for palliative care: Secondary | ICD-10-CM | POA: Diagnosis not present

## 2021-05-15 DIAGNOSIS — B9620 Unspecified Escherichia coli [E. coli] as the cause of diseases classified elsewhere: Secondary | ICD-10-CM | POA: Diagnosis not present

## 2021-05-15 DIAGNOSIS — R339 Retention of urine, unspecified: Secondary | ICD-10-CM | POA: Diagnosis present

## 2021-05-15 DIAGNOSIS — D62 Acute posthemorrhagic anemia: Secondary | ICD-10-CM | POA: Diagnosis not present

## 2021-05-15 DIAGNOSIS — N871 Moderate cervical dysplasia: Secondary | ICD-10-CM | POA: Diagnosis present

## 2021-05-15 DIAGNOSIS — M25559 Pain in unspecified hip: Secondary | ICD-10-CM | POA: Diagnosis not present

## 2021-05-15 DIAGNOSIS — E860 Dehydration: Secondary | ICD-10-CM | POA: Diagnosis present

## 2021-05-15 DIAGNOSIS — L89322 Pressure ulcer of left buttock, stage 2: Secondary | ICD-10-CM | POA: Diagnosis present

## 2021-05-15 DIAGNOSIS — Z7901 Long term (current) use of anticoagulants: Secondary | ICD-10-CM

## 2021-05-15 DIAGNOSIS — Z8249 Family history of ischemic heart disease and other diseases of the circulatory system: Secondary | ICD-10-CM

## 2021-05-15 DIAGNOSIS — Z9181 History of falling: Secondary | ICD-10-CM

## 2021-05-15 DIAGNOSIS — F419 Anxiety disorder, unspecified: Secondary | ICD-10-CM | POA: Diagnosis present

## 2021-05-15 DIAGNOSIS — Z800 Family history of malignant neoplasm of digestive organs: Secondary | ICD-10-CM

## 2021-05-15 DIAGNOSIS — M1712 Unilateral primary osteoarthritis, left knee: Secondary | ICD-10-CM | POA: Diagnosis present

## 2021-05-15 DIAGNOSIS — Z87891 Personal history of nicotine dependence: Secondary | ICD-10-CM

## 2021-05-15 DIAGNOSIS — Z87440 Personal history of urinary (tract) infections: Secondary | ICD-10-CM

## 2021-05-15 LAB — CBC WITH DIFFERENTIAL/PLATELET
Abs Immature Granulocytes: 0.04 10*3/uL (ref 0.00–0.07)
Basophils Absolute: 0 10*3/uL (ref 0.0–0.1)
Basophils Relative: 0 %
Eosinophils Absolute: 0.1 10*3/uL (ref 0.0–0.5)
Eosinophils Relative: 1 %
HCT: 16.5 % — ABNORMAL LOW (ref 36.0–46.0)
Hemoglobin: 5 g/dL — CL (ref 12.0–15.0)
Immature Granulocytes: 1 %
Lymphocytes Relative: 23 %
Lymphs Abs: 1.3 10*3/uL (ref 0.7–4.0)
MCH: 28.6 pg (ref 26.0–34.0)
MCHC: 30.3 g/dL (ref 30.0–36.0)
MCV: 94.3 fL (ref 80.0–100.0)
Monocytes Absolute: 0.7 10*3/uL (ref 0.1–1.0)
Monocytes Relative: 12 %
Neutro Abs: 3.7 10*3/uL (ref 1.7–7.7)
Neutrophils Relative %: 63 %
Platelets: 97 10*3/uL — ABNORMAL LOW (ref 150–400)
RBC: 1.75 MIL/uL — ABNORMAL LOW (ref 3.87–5.11)
RDW: 18.2 % — ABNORMAL HIGH (ref 11.5–15.5)
WBC: 5.9 10*3/uL (ref 4.0–10.5)
nRBC: 0 % (ref 0.0–0.2)

## 2021-05-15 LAB — RESP PANEL BY RT-PCR (FLU A&B, COVID) ARPGX2
Influenza A by PCR: NEGATIVE
Influenza B by PCR: NEGATIVE
SARS Coronavirus 2 by RT PCR: POSITIVE — AB

## 2021-05-15 LAB — COMPREHENSIVE METABOLIC PANEL
ALT: 12 U/L (ref 0–44)
AST: 28 U/L (ref 15–41)
Albumin: 1.8 g/dL — ABNORMAL LOW (ref 3.5–5.0)
Alkaline Phosphatase: 119 U/L (ref 38–126)
Anion gap: 8 (ref 5–15)
BUN: 62 mg/dL — ABNORMAL HIGH (ref 8–23)
CO2: 25 mmol/L (ref 22–32)
Calcium: 7.4 mg/dL — ABNORMAL LOW (ref 8.9–10.3)
Chloride: 92 mmol/L — ABNORMAL LOW (ref 98–111)
Creatinine, Ser: 1.85 mg/dL — ABNORMAL HIGH (ref 0.44–1.00)
GFR, Estimated: 29 mL/min — ABNORMAL LOW (ref >60)
Glucose, Bld: 125 mg/dL — ABNORMAL HIGH (ref 70–99)
Potassium: 4.6 mmol/L (ref 3.5–5.1)
Sodium: 125 mmol/L — ABNORMAL LOW (ref 135–145)
Total Bilirubin: 0.1 mg/dL — ABNORMAL LOW (ref 0.3–1.2)
Total Protein: 5.9 g/dL — ABNORMAL LOW (ref 6.5–8.1)

## 2021-05-15 LAB — PROTIME-INR
INR: 2 — ABNORMAL HIGH (ref 0.8–1.2)
Prothrombin Time: 23 seconds — ABNORMAL HIGH (ref 11.4–15.2)

## 2021-05-15 LAB — BRAIN NATRIURETIC PEPTIDE: B Natriuretic Peptide: 121 pg/mL — ABNORMAL HIGH (ref 0.0–100.0)

## 2021-05-15 LAB — TROPONIN I (HIGH SENSITIVITY): Troponin I (High Sensitivity): 10 ng/L (ref <18)

## 2021-05-15 LAB — LACTIC ACID, PLASMA: Lactic Acid, Venous: 1.3 mmol/L (ref 0.5–1.9)

## 2021-05-15 MED ORDER — SODIUM CHLORIDE 0.9 % IV BOLUS (SEPSIS)
1000.0000 mL | Freq: Once | INTRAVENOUS | Status: AC
  Administered 2021-05-15: 1000 mL via INTRAVENOUS

## 2021-05-15 MED ORDER — NOREPINEPHRINE 4 MG/250ML-% IV SOLN
0.0000 ug/min | INTRAVENOUS | Status: DC
Start: 2021-05-15 — End: 2021-05-17
  Administered 2021-05-15: 2 ug/min via INTRAVENOUS
  Administered 2021-05-16: 6 ug/min via INTRAVENOUS
  Filled 2021-05-15 (×2): qty 250

## 2021-05-15 MED ORDER — MEGESTROL ACETATE 40 MG PO TABS
120.0000 mg | ORAL_TABLET | Freq: Every day | ORAL | Status: DC
  Administered 2021-05-15 – 2021-05-18 (×4): 120 mg via ORAL
  Filled 2021-05-15 (×4): qty 3

## 2021-05-15 MED ORDER — TRANEXAMIC ACID FOR EPISTAXIS
500.0000 mg | Freq: Once | TOPICAL | Status: DC
  Filled 2021-05-15: qty 10

## 2021-05-15 MED ORDER — TRANEXAMIC ACID-NACL 1000-0.7 MG/100ML-% IV SOLN
1000.0000 mg | INTRAVENOUS | Status: AC
  Administered 2021-05-15: 1000 mg via INTRAVENOUS
  Filled 2021-05-15: qty 100

## 2021-05-15 MED ORDER — SODIUM CHLORIDE 0.9 % IV SOLN
10.0000 mL/h | Freq: Once | INTRAVENOUS | Status: AC
  Administered 2021-05-15: 10 mL/h via INTRAVENOUS

## 2021-05-15 MED ORDER — PANTOPRAZOLE SODIUM 40 MG IV SOLR
40.0000 mg | Freq: Once | INTRAVENOUS | Status: AC
  Administered 2021-05-15: 40 mg via INTRAVENOUS
  Filled 2021-05-15: qty 40

## 2021-05-15 MED ORDER — SODIUM CHLORIDE 0.9 % IV BOLUS
1000.0000 mL | Freq: Once | INTRAVENOUS | Status: AC
  Administered 2021-05-15: 1000 mL via INTRAVENOUS

## 2021-05-15 NOTE — ED Notes (Signed)
Date and time results received: 05/15/21 1949   Test: COVID Critical Value: POSITIVE  Name of Provider Notified: Karle Starch, MD; Ileene Patrick, [place]

## 2021-05-15 NOTE — ED Triage Notes (Signed)
Pt sent here from [place] due to her Hgb is off.pt denies any pain

## 2021-05-15 NOTE — ED Notes (Signed)
Date and time results received: 05/15/21 5:54 PM    Test: Hbg Critical Value: 5  Name of Provider Notified: Delmer Islam  Orders Received? Or Actions Taken?: See orders

## 2021-05-15 NOTE — ED Provider Notes (Signed)
[facility] Provider Note   CSN: [id number] Arrival date & time: 05/15/21  1556     History Chief Complaint  Patient presents with   Abnormal Lab    Crystal Vega is a 70 y.o. female.  HPI  Patient with medical history including metastatic breast cancer currently receiving chemotherapy hypertension, diabetes, DVT, presents with chief complaint of abnormal hemoglobin.  Patient is coming from [place] where they noted that her hemoglobin was low and was sent here for further evaluation.  She states that over the last few weeks she has felt slightly fatigued, tired and cold but she states that at baseline she remains in bed and does not ambulate.she does not endorse any lightheaded or dizziness, shortness of breath or chest pain.  She does note that she has been having some vaginal bleeding this was going for last 2 weeks she has no vaginal pain no urinary symptoms, no stomach pains nausea vomiting constipation or diarrhea.  She does note that she does note that she has dark  stools but she states this is normal for her as she has a history of colon cancer, denies any frank hematochezia.  She does not endorse fevers, chills, nasal congestion, sore throat, cough, chest pain, general body aches, she has no other complaints at this time.  After reviewing patient's charts patient has no history of colon adenocarcinoma fortunately she is not a candidate for surgery, and will have episodes of bloody stools which they suspect likely from malignancy.  Patient's current treatment plan is to continue with chemotherapy was taken off Eliquis, transfusion as needed to keep him above 7.  Also of note patient had a previous transvaginal ultrasound which shows thickened endometrial strips measuring up to 19 mm with recommendations to obtain endometrial sampling to rule out of carcinoma.  Past Medical History:  Diagnosis Date   Anemia    Arthritis    per patient " left  knee"   DVT (deep venous thrombosis) (HCC)    left leg   Dyspnea    Essential hypertension, benign    Family history of cancer of female genital organ    Family history of GI tract cancer    Metastatic breast cancer ([place])    left breast   Port-A-Cath in place 12/25/2019   Type 2 diabetes mellitus [facility])     Patient Active Problem List   Diagnosis Date Noted   Circulatory failure ([place]) 05/15/2021   Streptococcal (Strep Oralis/Mitis)  bacteremia--Pt has PorthAcath 05/04/2021   Pressure injury of skin 04/28/2021   Proteus Mirabilis and Klebsiella UTI (urinary tract infection) 04/28/2021   Fall 04/27/2021   Tachycardia 04/27/2021   Thrombocytopenia ([place]) 04/27/2021   Chronic anemia 04/27/2021   Drug-induced neutropenia (HCC) 02/03/2021   Iron deficiency anemia 02/03/2021   Nausea without vomiting 11/12/2020   Rectal bleeding 08/06/2020   Left leg DVT ([place]) 03/03/2020   Family history of GI tract cancer    Family history of cancer of female genital organ    Port-A-Cath in place 12/25/2019   Colonic mass 12/19/2019   Goals of care, counseling/discussion 12/17/2019   Malignant neoplasm of overlapping sites of left breast in female, estrogen receptor positive ([place]) 11/28/2019   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   Heme positive stool 02/27/2018   FH: colon cancer 02/27/2018   Constipation 02/27/2018   Simple endometrial 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 04/16/2014   Atypical chest pain 10/10/2013   Essential hypertension, benign  10/10/2013   Morbid obesity ([place]) 10/10/2013   Type 2 diabetes mellitus ([place]) 10/10/2013    Past Surgical History:  Procedure Laterality Date   BIOPSY  11/27/2020   Procedure: BIOPSY;  Surgeon: Daneil Dolin, MD;  [facility];  Service: Endoscopy;;   CATARACT EXTRACTION W/ INTRAOCULAR LENS IMPLANT Right    COLONOSCOPY WITH PROPOFOL N/A 11/27/2020   Procedure: COLONOSCOPY WITH PROPOFOL;  Surgeon: Daneil Dolin,  MD;  Location: [facility];  Service: Endoscopy;  Laterality: N/A;  9:00am   HYSTEROSCOPY WITH D & C N/A 06/05/2014   Procedure: DILATATION AND CURETTAGE /HYSTEROSCOPY;  Surgeon: Florian Buff, MD;  Location: [facility] ORS;  Service: Gynecology;  Laterality: N/A;   POLYPECTOMY N/A 06/05/2014   Procedure: ENDOMETRIAL POLYPECTOMY;  Surgeon: Florian Buff, MD;  Location: [facility];  Service: Gynecology;  Laterality: N/A;   PORTACATH PLACEMENT N/A 12/14/2019   Procedure: INSERTION PORT-A-CATH WITH ULTRASOUND GUIDANCE;  Surgeon: Donnie [place], MD;  Location: [facility];  Service: General;  Laterality: N/A;   TUBAL LIGATION       OB History   No obstetric history on file.     Family History  Problem Relation Age of Onset   Diabetes Mellitus II Father    Congestive Heart Failure Father    Colon cancer Mother 68       patient not sure if colon vs stomach   Stroke Maternal Grandmother    Cancer Cousin        female reproductive cancer, dx. 50s/60s    Social History   Tobacco Use   Smoking status: Former    Types: Cigarettes   Smokeless tobacco: Never   Tobacco comments:    quit about 40+ years ago  Vaping Use   Vaping Use: Never used  Substance Use Topics   Alcohol use: Never   Drug use: No    Home Medications Prior to Admission medications   Medication Sig Start Date End Date Taking? Authorizing Provider  acetaminophen (TYLENOL) 325 MG tablet Take 2 tablets (650 mg total) by mouth every 6 (six) hours as needed for mild pain (or Fever >/= 101). 05/05/21   Roxan Hockey, MD  amoxicillin (AMOXIL) 500 MG capsule Take 2 capsules (1,000 mg total) by mouth 3 (three) times daily for 10 days. 05/06/21 05/16/21  Roxan Hockey, MD  apixaban (ELIQUIS) 5 MG TABS tablet Take 1 tablet (5 mg total) by mouth 2 (two) times daily. 01/30/21   [name], MD  Ensure Max Protein (ENSURE MAX PROTEIN) LIQD Take 330 mLs (11 oz total) by mouth daily. 05/06/21   Roxan Hockey, MD  fentaNYL (DURAGESIC) 50  MCG/HR Place 1 patch onto the skin every 3 (three) days. 05/05/21   Roxan Hockey, MD  ferrous sulfate 325 (65 FE) MG tablet Take 325 mg by mouth daily with breakfast.    [provider]  folic acid (FOLVITE) 1 MG tablet TAKE 1 TABLET BY MOUTH EVERY DAY Patient taking differently: Take 1 mg by mouth daily. 02/09/21   [name], MD  gabapentin (NEURONTIN) 300 MG capsule Take 1 capsule (300 mg total) by mouth 3 (three) times daily. 05/05/21   Roxan Hockey, MD  HYDROmorphone (DILAUDID) 2 MG tablet Take 1 tablet (2 mg total) by mouth every 4 (four) hours as needed for severe pain. 05/05/21   Roxan Hockey, MD  KLOR-CON M10 10 MEQ tablet Take 10 mEq by mouth daily. 12/30/20   [provider]  lidocaine (LIDODERM) 5 %  Place 1 patch onto the skin daily. Remove & Discard patch within 12 hours or as directed by MD 05/06/21   Roxan Hockey, MD  lidocaine-prilocaine (EMLA) cream Apply 1 application topically as needed. 04/02/21   [name], MD  losartan (COZAAR) 50 MG tablet Take 50 mg by mouth daily.    [provider]  metFORMIN (GLUCOPHAGE) 500 MG tablet Take 500 mg by mouth 2 (two) times daily with a meal.    [provider]  metoprolol succinate (TOPROL XL) 25 MG 24 hr tablet Take 1 tablet (25 mg total) by mouth daily. 05/05/21 05/05/22  Roxan Hockey, MD  nutrition supplement, JUVEN, (JUVEN) PACK Take 1 packet by mouth 2 (two) times daily between meals. 05/06/21   Roxan Hockey, MD  Nystatin (GERHARDT'S BUTT CREAM) CREA Apply 1 application topically 3 (three) times daily. 05/05/21   Roxan Hockey, MD  ondansetron (ZOFRAN) 4 MG tablet Take 1 tablet (4 mg total) by mouth daily as needed for nausea or vomiting. 05/05/21 05/05/22  Roxan Hockey, MD  polyethylene glycol (MIRALAX / GLYCOLAX) 17 g packet Take 17 g by mouth 2 (two) times daily. 05/05/21   Roxan Hockey, MD  pravastatin (PRAVACHOL) 10 MG tablet Take 10 mg by mouth daily.      [provider]  senna-docusate (SENOKOT-S) 8.6-50 MG tablet Take 2 tablets by mouth at bedtime. 05/05/21 05/05/22  Roxan Hockey, MD    Allergies    Patient has no known allergies.  Review of Systems   Review of Systems  Constitutional:  Positive for fatigue. Negative for chills and fever.  HENT:  Negative for congestion.   Respiratory:  Negative for shortness of breath.   Cardiovascular:  Negative for chest pain.  Gastrointestinal:  Negative for abdominal pain.  Endocrine: Positive for cold intolerance.  Genitourinary:  Positive for vaginal bleeding. Negative for enuresis and pelvic pain.  Musculoskeletal:  Negative for back pain.  Skin:  Negative for rash.  Neurological:  Negative for dizziness.  Hematological:  Does not bruise/bleed easily.   Physical Exam Updated Vital Signs BP 125/70    Pulse 71    Temp 98.2 F (36.8 C) (Axillary)    Resp 14    Ht _0  (1.6 m)    Wt 114.3 kg    SpO2 100%    BMI 44.64 kg/m   Physical Exam Vitals and nursing note reviewed. Exam conducted with a chaperone present.  Constitutional:      General: She is not in acute distress.    Appearance: She is not ill-appearing.     Comments: Deconditioned state, pale, vital signs significant for hypotension initial blood pressure of 89/57, alert and oriented no acute signs distress  HENT:     Head: Normocephalic and atraumatic.     Nose: No congestion.     Mouth/Throat:     Mouth: Mucous membranes are moist.     Pharynx: Oropharynx is clear. No oropharyngeal exudate or posterior oropharyngeal erythema.  Eyes:     Conjunctiva/sclera: Conjunctivae normal.  Cardiovascular:     Rate and Rhythm: Normal rate and regular rhythm.     Pulses: Normal pulses.     Heart sounds: No murmur heard.   No friction rub. No gallop.  Pulmonary:     Effort: No respiratory distress.     Breath sounds: No wheezing, rhonchi or rales.  Abdominal:     Palpations: Abdomen is soft.     Tenderness: There is no  abdominal tenderness. There is no  right CVA tenderness or left CVA tenderness.  Genitourinary:    Comments: With chaperone present rectal exam was performed there is no external hemorrhoids present, no palpable internal hemorrhoids or other gross abnormalities present, patient has dark tarry stools, a positive Hemoccult, of note she did have vaginal bleeding unclear if this may have contaminated the Hemoccult.  Pelvic exam was performed no abnormalities of the external genitalia, she had large clots noted in the posterior aspect of the vaginal vault, there is small amount of blood noted in the posterior vaginal vault, had difficulty visualizing the cervix, there is no other gross abnormalities present, she is nontender on exam, no chandelier sign or adnexal pain. Musculoskeletal:     Right lower leg: No edema.     Left lower leg: No edema.  Skin:    General: Skin is warm and dry.  Neurological:     Mental Status: She is alert.  Psychiatric:        Mood and Affect: Mood normal.    ED Results / Procedures / Treatments   Labs (all labs ordered are listed, but only abnormal results are displayed) Labs Reviewed  RESP PANEL BY RT-PCR (FLU A&B, COVID) ARPGX2 - Abnormal; Notable for the following components:      Result Value   SARS Coronavirus 2 by RT PCR POSITIVE (*)    All other components within normal limits  CBC WITH DIFFERENTIAL/PLATELET - Abnormal; Notable for the following components:   RBC 1.75 (*)    Hemoglobin 5.0 (*)    HCT 16.5 (*)    RDW 18.2 (*)    Platelets 97 (*)    All other components within normal limits  BRAIN NATRIURETIC PEPTIDE - Abnormal; Notable for the following components:   B Natriuretic Peptide 121.0 (*)    All other components within normal limits  COMPREHENSIVE METABOLIC PANEL - Abnormal; Notable for the following components:   Sodium 125 (*)    Chloride 92 (*)    Glucose, Bld 125 (*)    BUN 62 (*)    Creatinine, Ser 1.85 (*)    Calcium 7.4 (*)     Total Protein 5.9 (*)    Albumin 1.8 (*)    Total Bilirubin 0.1 (*)    GFR, Estimated 29 (*)    All other components within normal limits  PROTIME-INR - Abnormal; Notable for the following components:   Prothrombin Time 23.0 (*)    INR 2.0 (*)    All other components within normal limits  CULTURE, BLOOD (ROUTINE X 2)  CULTURE, BLOOD (ROUTINE X 2)  LACTIC ACID, PLASMA  DIC (DISSEMINATED INTRAVASCULAR COAGULATION)PANEL  VITAMIN K1, SERUM  CBC WITH DIFFERENTIAL/PLATELET  POC OCCULT BLOOD, ED  TYPE AND SCREEN  PREPARE RBC (CROSSMATCH)  TROPONIN I (HIGH SENSITIVITY)    EKG EKG Interpretation  Date/Time:  Friday May 15 2021 20:51:20 EST Ventricular Rate:  77 PR Interval:  181 QRS Duration: 101 QT Interval:  350 QTC Calculation: 396 R Axis:   16 Text Interpretation: Sinus rhythm Low voltage, precordial leads Since last tracing Sinus rhythm has replaced Narrow QRS tachycardia Confirmed by Calvert Cantor [phone]) on 05/15/2021 9:51:47 PM  Radiology No results found.  Procedures .Critical Care Performed by: Marcello Fennel, PA-C Authorized by: Marcello Fennel, PA-C   Critical care provider statement:    Critical care time (minutes):  74   Critical care time was exclusive of:  Separately billable procedures and treating other patients   Critical care was necessary  to treat or prevent imminent or life-threatening deterioration of the following conditions:  Circulatory failure   Critical care was time spent personally by me on the following activities:  Ordering and performing treatments and interventions, ordering and review of laboratory studies, ordering and review of radiographic studies, pulse oximetry, re-evaluation of patient's condition, review of old charts, examination of patient, evaluation of patient's response to treatment and discussions with consultants   I assumed direction of critical care for this patient from another provider in my specialty: no      Care discussed with: admitting provider     Medications Ordered in ED Medications  tranexamic acid (CYKLOKAPRON) 1000 MG/10ML topical solution 500 mg (500 mg Topical Not Given 05/15/21 2130)  megestrol (MEGACE) tablet 120 mg (120 mg Oral Given 05/15/21 2107)  norepinephrine (LEVOPHED) 54m in 2574m(0.016 mg/mL) premix infusion (10 mcg/min Intravenous Rate/Dose Change 05/15/21 2329)  sodium chloride 0.9 % bolus 1,000 mL (0 mLs Intravenous Stopped 05/15/21 1934)  0.9 %  sodium chloride infusion (0 mL/hr Intravenous Stopped 05/15/21 2334)  pantoprazole (PROTONIX) injection 40 mg (40 mg Intravenous Given 05/15/21 1838)  sodium chloride 0.9 % bolus 1,000 mL (0 mLs Intravenous Stopped 05/15/21 2147)    Followed by  sodium chloride 0.9 % bolus 1,000 mL (0 mLs Intravenous Stopped 05/15/21 2147)  tranexamic acid (CYKLOKAPRON) IVPB 1,000 mg (0 mg Intravenous Stopped 05/16/21 0007)    ED Course  I have reviewed the triage vital signs and the nursing notes.  Pertinent labs & imaging results that were available during my care of the patient were reviewed by me and considered in my medical decision making (see chart for details).    MDM Rules/Calculators/A&P                         Initial impression-presents with abnormal hemoglobin, she is alert, does not show signs acute distress, vital signs significant for hypotension, I am concerned for lower GI bleed will obtain basic lab work-up, start patient on fluids, type and screen and reassess.  Work-up-CBC shows normocytic anemia hemoglobin of 5, CMP shows sodium of 125, chloride 92, glucose 125, BUN of 62, creatinine 1.85, calcium 7.4, albumin 1.8, GFR 29, prothrombin time 23 INR 2, lactic 1.7, troponin 10, respiratory panel positive for COVID, BNP 121.  EKG sinus without signs of ischemia.  Reassessment-patient was started on fluids, BP still remains low at the systolic in the 6053Gobtain manual blood pressure confirm that this is accurate, patient is  placed in Trendelenburg.  Noted that patient has hemoglobin of 5 and a positive Hemoccult with active vaginal bleeding will provide emergency blood at this time.  Due to concerns of vaginal bleeding will consult with OB/GYN as well as GI for further recommendations.  Patient  reassessed despite 2 L of fluids as well as unit of blood still remains hypotensive, will initiate Levophed and consult critical care for further recommendations.  Patient is reassessed after Levophed BP has remained stable, she has no complaints, will continue to monitor.  Consult-  spoke with Dr. CaJenetta Downerf GI he recommends stabilizing the patient, NPO, if she is stable will bring patient to endoscopy tomorrow for further evaluation of upper GI bleed.  2. Spoke with Dr. StSanto Heldhe hospitalist who is also an OB/GYN he recommends Megace, TXA, and on DIC panel.   3. Spoke with Dr. IsRosana Bergerho will accept the patient, he would like vitamin K and DIC panel add on the  patient.  Rule out-I have low suspicion for systemic infection patient nontoxic-appearing, no leukocytosis, lactic normal ,she is noted to be hypotensive but I suspect this is likely secondary due to a chronic bleed likely multifactorial,  vaginal as well as upper GI.  I have low suspicion for cardiogenic shock as she is having no chest pain, no peripheral edema, troponin is 10, EKG without signs of ischemia.  Low suspicion for ruptured stomach ulcer as abdomen is nondistended, no peritoneal sign, presentation atypical etiology.   I have low suspicion for liver or gallbladder abnormality as she has no right upper quadrant tenderness, liver enzymes, alk phos, T bili all within normal limits.  Low suspicion for bowel obstruction as abdomen is nondistended normal bowel sounds, so passing gas and having normal bowel movements.  Low suspicion for complicated diverticulitis as she is nontoxic-appearing, vital signs reassuring no leukocytosis present.   I have low  suspicion for intra-abdominal infection as she has low risk factors, no leukocytosis, will defer imaging at this time.   Plan-admission and transfer to [facility] for circulatory failure.  I suspect this is likely secondary to acute on chronic internal bleed which is likely multifactorial upper GI bleed from colon cancer as well as vaginal bleeding which is concerning for possible uterine cancer.     Final Clinical Impression(s) / ED Diagnoses Final diagnoses:  Symptomatic anemia  Gastrointestinal hemorrhage with melena  Vaginal bleeding    Rx / DC Orders ED Discharge Orders     None        Marcello Fennel, PA-C 05/16/21 0017    Truddie Hidden, MD 05/17/21 1505

## 2021-05-15 NOTE — ED Notes (Signed)
Pt hypotensive, PA Ileene Patrick.

## 2021-05-15 NOTE — H&P (Signed)
NAME:  Crystal Vega, MRN:  [id number], DOB:  November 09, 1950, LOS: 0 ADMISSION DATE:  05/15/2021, CONSULTATION DATE:  12/16 REFERRING MD:  Dr. Karle Starch, CHIEF COMPLAINT:  ABLA; hypovolemic shock   History of Present Illness:  Patient is a 70 yo female with pertinent PMH of breast cancer undergoing chemo, colonic adenocarcinoma, iron deficiency anemia, DVT on Eliquis presents to [facility] ED on 12/16 with ABLA.  Patient was at [facility] where they noted on 12/16 her hemoglobin was low and sent to [facility] ED for further eval.  Patient reports more fatigued and some vaginal bleeding for about 2 weeks.  Denies any frank red bloody stools but does have hx of colon adenocarcinoma and has occasional bloody stools.  Denies N/V, fever.  Patient has history of DVT and is on Eliquis.  Patient has had a recent transvaginal ultrasound which showed thickened endometrium up to 19 mm with plans to receive a endometrial sampling to rule out carcinoma.  ED course: Patient a/o, hypotensive with SBP in 60s.  Pertinent labs: NA 125, BUN 62, creatinine 1.85, HGB 5, WBC 5.9, platelets 97.  COVID-positive.  Hemoccult positive.  DIC panel pending.  Given vitamin K, Megace, TXA.  Patient started on IV fluids and given a PRBC transfusion.  OB/GYN and GI consulted.  Started on Levophed.  PCCM consulted for ICU admission and transfer from [facility].  Pertinent  Medical History   Past Medical History:  Diagnosis Date   Anemia    Arthritis    per patient " left knee"   DVT (deep venous thrombosis) (HCC)    left leg   Dyspnea    Essential hypertension, benign    Family history of cancer of female genital organ    Family history of GI tract cancer    Metastatic breast cancer ([place])    left breast   Port-A-Cath in place 12/25/2019   Type 2 diabetes mellitus ([place])      Significant Hospital Events: Including procedures, antibiotic start and stop dates in addition to other pertinent events   12/17: transferring from [facility]  ED to [facility] for ICU admission; hypovolemic shock  Interim History / Subjective:  Patient alert and oriented Patient hgb now 9 On low dose of levo  Objective   Blood pressure 127/69, pulse 70, temperature 98.2 F (36.8 C), temperature source Axillary, resp. rate 12, height 5\' 3"  (1.6 m), weight 114.3 kg, SpO2 100 %.        Intake/Output Summary (Last 24 hours) at 05/15/2021 2352 Last data filed at 05/15/2021 2147 Gross per 24 hour  Intake 1430 ml  Output --  Net 1430 ml   Filed Weights   05/15/21 1601  Weight: 114.3 kg    Examination: General:   NAD HEENT: MM pink/moist; [place] in place Neuro: Aox3; MAE CV: s1s2, RRR, no m/r/g PULM:  dim clear BS bilaterally; [place] 2L GI: soft, bsx4 active  Extremities: warm/dry, trace BLE edema; pain in left leg described as muscle spasms Skin: no rashes or lesions    Resolved Hospital Problem list     Assessment & Plan:  Hypovolemic shock Vaginal bleeding: transvaginal u/s shows 19 mm thick endometrium; will need biopsy when stable to r/o uterine cancer Hemoccult positive: possible GI bleed ABLA on chronic iron deficient anemia Thrombocytopenia POA P: -admit to icu for closer monitoring -hold home eliquis -continue levo for MAP goal >65 -trend CBC -transfuse for Hgb <7 or hemodynamically unstable -OBGYN consulted: given txa and megace -continue to monitor  vaginal bleeding -GI consulted: NPO; consider endoscopy when stable -PPI -vitamin K given -follow Bcx2 -follow DIC panel  AKI P: -IV fluids given -Trend BMP / urinary output -Replace electrolytes as indicated -Avoid nephrotoxic agents, ensure adequate renal perfusion  Hyponatremia P: -likely due to hypovolemia; consider further workup -trend BMP  Incidental Covid positive P: -covid precautions in place -respiratory status stable -check CXR -consider covid treatment: Remdesivir vs. Supportive care; paxlovid contraindicated in this patient -pulm toiletry:  IS -PT/OT  Hx of DVT P: -hold home eliquis -consider restarting when bleeding stable -SCDs  Hx of L breast cancer: chemotherapy outpatient Hx of R colon adenocarcinoma P: -continue to follow up outpatient  Hx of HTN, HLD P: -hold home anti-hypertensives (losartan and toprol) -hold home statin while npo  T2DM P: -SSI and cbg monitoring -check a1c  GERD P: -PPI  GOC P: -12/17: spoke with patient with RN at bedside. Patient expressed she wishes to be DNR and not have any medications or compressions done if her heart were to stop. She also expressed she would not want to be intubated if her heart were to stop.  Best Practice (right click and "Reselect all SmartList Selections" daily)   Diet/type: NPO w/ oral meds DVT prophylaxis: SCD GI prophylaxis: PPI Lines: Central line Foley:  N/A Code Status:  DNR Last date of multidisciplinary goals of care discussion [12/17: spoke with patient with RN at bedside. Patient expressed she wishes to be DNR and not have any medications or compressions done if her heart were to stop. She also expressed she would not want to be intubated if her heart were to stop.]  Labs   CBC: Recent Labs  Lab 05/15/21 1658  WBC 5.9  NEUTROABS 3.7  HGB 5.0*  HCT 16.5*  MCV 94.3  PLT 97*    Basic Metabolic Panel: Recent Labs  Lab 05/15/21 1659  NA 125*  K 4.6  CL 92*  CO2 25  GLUCOSE 125*  BUN 62*  CREATININE 1.85*  CALCIUM 7.4*   GFR: Estimated Creatinine Clearance: 35 mL/min (A) (by C-G formula based on SCr of 1.85 mg/dL (H)). Recent Labs  Lab 05/15/21 1658 05/15/21 2053  WBC 5.9  --   LATICACIDVEN  --  1.3    Liver Function Tests: Recent Labs  Lab 05/15/21 1659  AST 28  ALT 12  ALKPHOS 119  BILITOT 0.1*  PROT 5.9*  ALBUMIN 1.8*   No results for input(s): LIPASE, AMYLASE in the last 168 hours. No results for input(s): AMMONIA in the last 168 hours.  ABG No results found for: PHART, PCO2ART, PO2ART, HCO3, TCO2,  ACIDBASEDEF, O2SAT   Coagulation Profile: Recent Labs  Lab 05/15/21 1658  INR 2.0*    Cardiac Enzymes: No results for input(s): CKTOTAL, CKMB, CKMBINDEX, TROPONINI in the last 168 hours.  HbA1C: Hgb A1c MFr Bld  Date/Time Value Ref Range Status  04/28/2021 04:32 AM 5.5 4.8 - 5.6 % Final    Comment:    (NOTE)         Prediabetes: 5.7 - 6.4         Diabetes: >6.4         Glycemic control for adults with diabetes: <7.0   12/11/2019 02:35 PM 6.2 (H) 4.8 - 5.6 % Final    Comment:    (NOTE) Pre diabetes:          5.7%-6.4%  Diabetes:              >6.4%  Glycemic  control for   <7.0% adults with diabetes     CBG: No results for input(s): GLUCAP in the last 168 hours.  Review of Systems:   Review of Systems  Constitutional:  Positive for malaise/fatigue. Negative for fever.  HENT:  Negative for congestion and sinus pain.   Respiratory:  Negative for cough, shortness of breath and wheezing.   Cardiovascular:  Negative for chest pain.  Gastrointestinal:  Negative for diarrhea, nausea and vomiting.    Past Medical History:  She,  has a past medical history of Anemia, Arthritis, DVT (deep venous thrombosis) ([place]), Dyspnea, Essential hypertension, benign, Family history of cancer of female genital organ, Family history of GI tract cancer, Metastatic breast cancer ([place]), Port-A-Cath in place (12/25/2019), and Type 2 diabetes mellitus ([place]).   Surgical History:   Past Surgical History:  Procedure Laterality Date   BIOPSY  11/27/2020   Procedure: BIOPSY;  Surgeon: Daneil Dolin, MD;  Location: [facility] ENDO SUITE;  Service: Endoscopy;;   CATARACT EXTRACTION W/ INTRAOCULAR LENS IMPLANT Right    COLONOSCOPY WITH PROPOFOL N/A 11/27/2020   Procedure: COLONOSCOPY WITH PROPOFOL;  Surgeon: Daneil Dolin, MD;  Location: [facility] ENDO SUITE;  Service: Endoscopy;  Laterality: N/A;  9:00am   HYSTEROSCOPY WITH D & C N/A 06/05/2014   Procedure: DILATATION AND CURETTAGE /HYSTEROSCOPY;  Surgeon:  Florian Buff, MD;  Location: [facility] ORS;  Service: Gynecology;  Laterality: N/A;   POLYPECTOMY N/A 06/05/2014   Procedure: ENDOMETRIAL POLYPECTOMY;  Surgeon: Florian Buff, MD;  Location: [facility] ORS;  Service: Gynecology;  Laterality: N/A;   PORTACATH PLACEMENT N/A 12/14/2019   Procedure: INSERTION PORT-A-CATH WITH ULTRASOUND GUIDANCE;  Surgeon: Donnie Mesa, MD;  Location: [place];  Service: General;  Laterality: N/A;   TUBAL LIGATION       Social History:   reports that she has quit smoking. Her smoking use included cigarettes. She has never used smokeless tobacco. She reports that she does not drink alcohol and does not use drugs.   Family History:  Her family history includes Cancer in her cousin; Colon cancer (age of onset: 29) in her mother; Congestive Heart Failure in her father; Diabetes Mellitus II in her father; Stroke in her maternal grandmother.   Allergies No Known Allergies   Home Medications  Prior to Admission medications   Medication Sig Start Date End Date Taking? Authorizing Provider  acetaminophen (TYLENOL) 325 MG tablet Take 2 tablets (650 mg total) by mouth every 6 (six) hours as needed for mild pain (or Fever >/= 101). 05/05/21   Roxan Hockey, MD  amoxicillin (AMOXIL) 500 MG capsule Take 2 capsules (1,000 mg total) by mouth 3 (three) times daily for 10 days. 05/06/21 05/16/21  Roxan Hockey, MD  apixaban (ELIQUIS) 5 MG TABS tablet Take 1 tablet (5 mg total) by mouth 2 (two) times daily. 01/30/21   [name], MD  Ensure Max Protein (ENSURE MAX PROTEIN) LIQD Take 330 mLs (11 oz total) by mouth daily. 05/06/21   Roxan Hockey, MD  fentaNYL (DURAGESIC) 50 MCG/HR Place 1 patch onto the skin every 3 (three) days. 05/05/21   Roxan Hockey, MD  ferrous sulfate 325 (65 FE) MG tablet Take 325 mg by mouth daily with breakfast.    [provider]  folic acid (FOLVITE) 1 MG tablet TAKE 1 TABLET BY MOUTH EVERY DAY Patient taking differently: Take 1 mg by mouth  daily. 02/09/21   [name], MD  gabapentin (NEURONTIN) 300 MG capsule Take 1 capsule (300 mg  total) by mouth 3 (three) times daily. 05/05/21   Roxan Hockey, MD  HYDROmorphone (DILAUDID) 2 MG tablet Take 1 tablet (2 mg total) by mouth every 4 (four) hours as needed for severe pain. 05/05/21   Roxan Hockey, MD  KLOR-CON M10 10 MEQ tablet Take 10 mEq by mouth daily. 12/30/20   [provider]  lidocaine (LIDODERM) 5 % Place 1 patch onto the skin daily. Remove & Discard patch within 12 hours or as directed by MD 05/06/21   Roxan Hockey, MD  lidocaine-prilocaine (EMLA) cream Apply 1 application topically as needed. 04/02/21   [name], MD  losartan (COZAAR) 50 MG tablet Take 50 mg by mouth daily.    [provider]  metFORMIN (GLUCOPHAGE) 500 MG tablet Take 500 mg by mouth 2 (two) times daily with a meal.    [provider]  metoprolol succinate (TOPROL XL) 25 MG 24 hr tablet Take 1 tablet (25 mg total) by mouth daily. 05/05/21 05/05/22  Roxan Hockey, MD  nutrition supplement, JUVEN, (JUVEN) PACK Take 1 packet by mouth 2 (two) times daily between meals. 05/06/21   Roxan Hockey, MD  Nystatin (GERHARDT'S BUTT CREAM) CREA Apply 1 application topically 3 (three) times daily. 05/05/21   Roxan Hockey, MD  ondansetron (ZOFRAN) 4 MG tablet Take 1 tablet (4 mg total) by mouth daily as needed for nausea or vomiting. 05/05/21 05/05/22  Roxan Hockey, MD  polyethylene glycol (MIRALAX / GLYCOLAX) 17 g packet Take 17 g by mouth 2 (two) times daily. 05/05/21   Roxan Hockey, MD  pravastatin (PRAVACHOL) 10 MG tablet Take 10 mg by mouth daily.     [provider]  senna-docusate (SENOKOT-S) 8.6-50 MG tablet Take 2 tablets by mouth at bedtime. 05/05/21 05/05/22  Roxan Hockey, MD     Critical care time: 45 minutes    JD [facility] 05/15/2021, 11:53 PM  Please see [email or web address] for pager details.  From 7A-7P  if no response, please call [phone]. After hours, please call ELink [phone].

## 2021-05-16 ENCOUNTER — Inpatient Hospital Stay (HOSPITAL_COMMUNITY): Payer: Medicare HMO

## 2021-05-16 DIAGNOSIS — E1165 Type 2 diabetes mellitus with hyperglycemia: Secondary | ICD-10-CM

## 2021-05-16 DIAGNOSIS — R571 Hypovolemic shock: Secondary | ICD-10-CM | POA: Diagnosis present

## 2021-05-16 DIAGNOSIS — K921 Melena: Secondary | ICD-10-CM

## 2021-05-16 DIAGNOSIS — U071 COVID-19: Secondary | ICD-10-CM

## 2021-05-16 DIAGNOSIS — D649 Anemia, unspecified: Secondary | ICD-10-CM

## 2021-05-16 DIAGNOSIS — N939 Abnormal uterine and vaginal bleeding, unspecified: Secondary | ICD-10-CM

## 2021-05-16 DIAGNOSIS — R579 Shock, unspecified: Secondary | ICD-10-CM

## 2021-05-16 DIAGNOSIS — N179 Acute kidney failure, unspecified: Secondary | ICD-10-CM

## 2021-05-16 LAB — URINALYSIS, COMPLETE (UACMP) WITH MICROSCOPIC
Bilirubin Urine: NEGATIVE
Glucose, UA: NEGATIVE mg/dL
Ketones, ur: NEGATIVE mg/dL
Nitrite: NEGATIVE
Protein, ur: 30 mg/dL — AB
Specific Gravity, Urine: 1.01 (ref 1.005–1.030)
pH: 5.5 (ref 5.0–8.0)

## 2021-05-16 LAB — DIC (DISSEMINATED INTRAVASCULAR COAGULATION)PANEL
D-Dimer, Quant: 2.98 ug/mL-FEU — ABNORMAL HIGH (ref 0.00–0.50)
Fibrinogen: 473 mg/dL (ref 210–475)
INR: 1.9 — ABNORMAL HIGH (ref 0.8–1.2)
Platelets: 103 10*3/uL — ABNORMAL LOW (ref 150–400)
Prothrombin Time: 21.8 seconds — ABNORMAL HIGH (ref 11.4–15.2)
Smear Review: NONE SEEN
aPTT: 60 seconds — ABNORMAL HIGH (ref 24–36)

## 2021-05-16 LAB — COMPREHENSIVE METABOLIC PANEL
ALT: 14 U/L (ref 0–44)
AST: 29 U/L (ref 15–41)
Albumin: 1.7 g/dL — ABNORMAL LOW (ref 3.5–5.0)
Alkaline Phosphatase: 117 U/L (ref 38–126)
Anion gap: 8 (ref 5–15)
BUN: 33 mg/dL — ABNORMAL HIGH (ref 8–23)
CO2: 24 mmol/L (ref 22–32)
Calcium: 7.9 mg/dL — ABNORMAL LOW (ref 8.9–10.3)
Chloride: 101 mmol/L (ref 98–111)
Creatinine, Ser: 0.77 mg/dL (ref 0.44–1.00)
GFR, Estimated: >60 mL/min (ref >60)
Glucose, Bld: 129 mg/dL — ABNORMAL HIGH (ref 70–99)
Potassium: 4 mmol/L (ref 3.5–5.1)
Sodium: 133 mmol/L — ABNORMAL LOW (ref 135–145)
Total Bilirubin: 0.8 mg/dL (ref 0.3–1.2)
Total Protein: 5.9 g/dL — ABNORMAL LOW (ref 6.5–8.1)

## 2021-05-16 LAB — BLOOD CULTURE ID PANEL (REFLEXED) - BCID2

## 2021-05-16 LAB — CBC WITH DIFFERENTIAL/PLATELET
Abs Immature Granulocytes: 0.08 10*3/uL — ABNORMAL HIGH (ref 0.00–0.07)
Basophils Absolute: 0 10*3/uL (ref 0.0–0.1)
Basophils Relative: 1 %
Eosinophils Absolute: 0.1 10*3/uL (ref 0.0–0.5)
Eosinophils Relative: 1 %
HCT: 28.9 % — ABNORMAL LOW (ref 36.0–46.0)
Hemoglobin: 9.3 g/dL — ABNORMAL LOW (ref 12.0–15.0)
Immature Granulocytes: 1 %
Lymphocytes Relative: 14 %
Lymphs Abs: 1.2 10*3/uL (ref 0.7–4.0)
MCH: 29.6 pg (ref 26.0–34.0)
MCHC: 32.2 g/dL (ref 30.0–36.0)
MCV: 92 fL (ref 80.0–100.0)
Monocytes Absolute: 0.6 10*3/uL (ref 0.1–1.0)
Monocytes Relative: 8 %
Neutro Abs: 6.4 10*3/uL (ref 1.7–7.7)
Neutrophils Relative %: 75 %
Platelets: 101 10*3/uL — ABNORMAL LOW (ref 150–400)
RBC: 3.14 MIL/uL — ABNORMAL LOW (ref 3.87–5.11)
RDW: 16.5 % — ABNORMAL HIGH (ref 11.5–15.5)
WBC: 8.3 10*3/uL (ref 4.0–10.5)
nRBC: 0 % (ref 0.0–0.2)

## 2021-05-16 LAB — HEMOGLOBIN A1C
Hgb A1c MFr Bld: 5.5 % (ref 4.8–5.6)
Mean Plasma Glucose: 111.15 mg/dL

## 2021-05-16 LAB — GLUCOSE, CAPILLARY
Glucose-Capillary: 185 mg/dL — ABNORMAL HIGH (ref 70–99)
Glucose-Capillary: 159 mg/dL — ABNORMAL HIGH (ref 70–99)
Glucose-Capillary: 159 mg/dL — ABNORMAL HIGH (ref 70–99)
Glucose-Capillary: 116 mg/dL — ABNORMAL HIGH (ref 70–99)
Glucose-Capillary: 81 mg/dL (ref 70–99)
Glucose-Capillary: 114 mg/dL — ABNORMAL HIGH (ref 70–99)
Glucose-Capillary: 92 mg/dL (ref 70–99)

## 2021-05-16 LAB — CBC
HCT: 25.5 % — ABNORMAL LOW (ref 36.0–46.0)
Hemoglobin: 8.1 g/dL — ABNORMAL LOW (ref 12.0–15.0)
MCH: 28.6 pg (ref 26.0–34.0)
MCHC: 31.8 g/dL (ref 30.0–36.0)
MCV: 90.1 fL (ref 80.0–100.0)
Platelets: UNDETERMINED 10*3/uL (ref 150–400)
RBC: 2.83 MIL/uL — ABNORMAL LOW (ref 3.87–5.11)
RDW: 16.4 % — ABNORMAL HIGH (ref 11.5–15.5)
WBC: 7.1 10*3/uL (ref 4.0–10.5)
nRBC: 0 % (ref 0.0–0.2)

## 2021-05-16 LAB — MAGNESIUM: Magnesium: 1.5 mg/dL — ABNORMAL LOW (ref 1.7–2.4)

## 2021-05-16 LAB — MRSA NEXT GEN BY PCR, NASAL: MRSA by PCR Next Gen: NOT DETECTED

## 2021-05-16 LAB — PREPARE RBC (CROSSMATCH)

## 2021-05-16 MED ORDER — OXYCODONE HCL 5 MG PO TABS
5.0000 mg | ORAL_TABLET | Freq: Four times a day (QID) | ORAL | Status: DC | PRN
  Administered 2021-05-16 – 2021-05-26 (×17): 5 mg via ORAL
  Filled 2021-05-16 (×18): qty 1

## 2021-05-16 MED ORDER — INSULIN ASPART 100 UNIT/ML IJ SOLN
0.0000 [IU] | INTRAMUSCULAR | Status: DC
  Administered 2021-05-16 (×2): 2 [IU] via SUBCUTANEOUS
  Administered 2021-05-17 – 2021-05-19 (×3): 1 [IU] via SUBCUTANEOUS
  Administered 2021-05-19: 18:00:00 2 [IU] via SUBCUTANEOUS
  Administered 2021-05-19: 1 [IU] via SUBCUTANEOUS
  Administered 2021-05-20: 17:00:00 2 [IU] via SUBCUTANEOUS
  Administered 2021-05-21 – 2021-05-22 (×3): 1 [IU] via SUBCUTANEOUS
  Administered 2021-05-23: 21:00:00 2 [IU] via SUBCUTANEOUS
  Administered 2021-05-24 – 2021-05-25 (×5): 1 [IU] via SUBCUTANEOUS

## 2021-05-16 MED ORDER — DOCUSATE SODIUM 100 MG PO CAPS
100.0000 mg | ORAL_CAPSULE | Freq: Two times a day (BID) | ORAL | Status: DC | PRN
  Administered 2021-05-22 – 2021-05-23 (×2): 100 mg via ORAL
  Filled 2021-05-16: qty 1

## 2021-05-16 MED ORDER — SODIUM CHLORIDE 0.9 % IV SOLN
2.0000 g | Freq: Two times a day (BID) | INTRAVENOUS | Status: DC
  Administered 2021-05-16 – 2021-05-17 (×3): 2 g via INTRAVENOUS
  Filled 2021-05-16 (×3): qty 2

## 2021-05-16 MED ORDER — LACTATED RINGERS IV BOLUS
1000.0000 mL | Freq: Once | INTRAVENOUS | Status: AC
  Administered 2021-05-16: 1000 mL via INTRAVENOUS

## 2021-05-16 MED ORDER — FENTANYL 50 MCG/HR TD PT72
1.0000 | MEDICATED_PATCH | TRANSDERMAL | Status: DC
  Administered 2021-05-16 – 2021-05-25 (×4): 1 via TRANSDERMAL
  Filled 2021-05-16 (×4): qty 1

## 2021-05-16 MED ORDER — CHLORHEXIDINE GLUCONATE CLOTH 2 % EX PADS
6.0000 | MEDICATED_PAD | Freq: Every day | CUTANEOUS | Status: DC
  Administered 2021-05-16 – 2021-05-26 (×10): 6 via TOPICAL

## 2021-05-16 MED ORDER — PANTOPRAZOLE SODIUM 40 MG IV SOLR
40.0000 mg | Freq: Two times a day (BID) | INTRAVENOUS | Status: DC
  Administered 2021-05-16 – 2021-05-17 (×4): 40 mg via INTRAVENOUS
  Filled 2021-05-16 (×4): qty 40

## 2021-05-16 MED ORDER — POLYETHYLENE GLYCOL 3350 17 G PO PACK: 17.0000 g | PACK | Freq: Every day | ORAL | Status: DC | PRN

## 2021-05-16 MED ORDER — PANTOPRAZOLE SODIUM 40 MG IV SOLR: 40.0000 mg | Freq: Every day | INTRAVENOUS | Status: DC

## 2021-05-16 NOTE — Evaluation (Signed)
Occupational Therapy Evaluation Patient Details Name: Crystal Vega MRN: [id number] DOB: [birth year] Today's Date: 05/16/2021   History of Present Illness Patient is a 70 yo female admitted to [facility] ED on 12/16 with ABLA.  transfer to [facility] on 12/17 due to hypotensive with incr medical issues.  Also positive for COVID.   Pt was at [facility] since early December after a falll.  Patient reports more fatigued and some vaginal bleeding for about 2 weeks. PMH of breast cancer undergoing chemo, colonic adenocarcinoma, iron deficiency anemia, DVT on Eliquis   Clinical Impression   Pt admitted with above. She demonstrates the below listed deficits and will benefit from continued OT to maximize safety and independence with BADLs.  Pt presents to OT with generalized weakness, impaired activity tolerance, increased pain, impaired Lt UE function.  She currently requires min A for grooming and max A for UB ADLs and total A for LB ADLs. She requires max A +2 for bed mobility.  She has been at [facility] for the last several weeks and reports she has not been able to stand since last admission due to Lt hip/LE pain.   Anticipate she will require return to SNF.  Recommend palliative care consult.        Recommendations for follow up therapy are one component of a multi-disciplinary discharge planning process, led by the attending physician.  Recommendations may be updated based on patient status, additional functional criteria and insurance authorization.   Follow Up Recommendations  Skilled nursing-short term rehab (<3 hours/day)    Assistance Recommended at Discharge Frequent or constant Supervision/Assistance  Functional Status Assessment  Patient has had a recent decline in their functional status and/or demonstrates limited ability to make significant improvements in function in a reasonable and predictable amount of time  Equipment Recommendations  None recommended by OT    Recommendations for  Other Services       Precautions / Restrictions Precautions Precautions: Fall Restrictions Weight Bearing Restrictions: No      Mobility Bed Mobility Overal bed mobility: Needs Assistance Bed Mobility: Rolling Rolling: Max assist;Mod assist;+2 for physical assistance         General bed mobility comments: Pt was wet with urine and needed all bed linens changed.  Pt was able to assit with roll right and left needing mod to max assist as pain limits her with sustaining lying on her side.  Rolled multiple times as pt was really wet. changed all dressings mepiplex and had nursing replace purewick and hope for better position than on arrival.    Transfers                   General transfer comment: unable at present      Balance                                           ADL either performed or assessed with clinical judgement   ADL Overall ADL's : Needs assistance/impaired Eating/Feeding: NPO   Grooming: Wash/dry hands;Wash/dry face;Oral care;Set up;Bed level   Upper Body Bathing: Moderate assistance;Bed level   Lower Body Bathing: Total assistance;Bed level   Upper Body Dressing : Bed level;Maximal assistance   Lower Body Dressing: Total assistance;Bed level   Toilet Transfer: Total assistance           Functional mobility during ADLs: Maximal assistance;+2 for safety/equipment;+2 for  physical assistance (bed mobility only)       Vision         Perception     Praxis      Pertinent Vitals/Pain Pain Assessment: Faces Faces Pain Scale: Hurts whole lot Pain Location: left knee and hip with any movement Pain Descriptors / Indicators: Grimacing;Guarding;Sore;Moaning;Crying Pain Intervention(s): Monitored during session;Limited activity within patient's tolerance;Repositioned     Hand Dominance     Extremity/Trunk Assessment Upper Extremity Assessment Upper Extremity Assessment: Defer to OT evaluation LUE Deficits / Details:  grossly -3/64for shoulder, baseline per patient due to nerve problem since Chemo therapy.  Pt with 0/5 wrist extension and finger extension LUE Sensation: WNL LUE Coordination: decreased fine motor;decreased gross motor   Lower Extremity Assessment Lower Extremity Assessment: Defer to PT evaluation RLE Deficits / Details: grossly 3-/5 LLE Deficits / Details: grossly 2-/5 mostly due to pain LLE: Unable to fully assess due to pain       Communication Communication Communication: No difficulties   Cognition Arousal/Alertness: Awake/alert Behavior During Therapy: WFL for tasks assessed/performed Overall Cognitive Status: Within Functional Limits for tasks assessed                                       General Comments  77 bpm, 34, 138/116    Exercises Exercises: General Lower Extremity General Exercises - Lower Extremity Ankle Circles/Pumps: Supine;Both;Strengthening;AROM;10 reps Quad Sets: AROM;Right;10 reps;Supine Heel Slides: AAROM;10 reps;AROM;Supine;Right Hip ABduction/ADduction: AAROM;Right;10 reps;Supine Straight Leg Raises: AROM;Right;AAROM;10 reps;Supine   Shoulder Instructions      Home Living Family/patient expects to be discharged to:: Private residence Living Arrangements: Spouse/significant other Available Help at Discharge: Family;Available PRN/intermittently Type of Home: House Home Access: Stairs to enter [employer] of Steps: 1-2 Entrance Stairs-Rails: None Home Layout: One level     Bathroom Shower/Tub: Teacher, early years/pre: Handicapped height Bathroom Accessibility: Yes   Home Equipment: Conservation officer, nature (2 wheels);Cane - single point;Grab bars - tub/shower          Prior Functioning/Environment Prior Level of Function : Needs assist  Cognitive Assist : Mobility (cognitive);ADLs (cognitive) Mobility (Cognitive): Set up cues ADLs (Cognitive): Set up cues Physical Assist : Mobility (physical);ADLs  (physical) Mobility (physical): Bed mobility;Transfers;Gait ADLs (physical): Bathing;Dressing;Toileting Mobility Comments: household and short distanced community ambulator prior to [place] of this year, hasnt been  OOB since Nov per pt - was working her LES in facility. does not drive ADLs Comments: A with ADLs since the fall        OT Problem List: Decreased strength;Decreased activity tolerance;Impaired balance (sitting and/or standing);Decreased safety awareness;Decreased knowledge of use of DME or AE;Obesity;Impaired UE functional use;Pain      OT Treatment/Interventions: Self-care/ADL training;Neuromuscular education;Therapeutic exercise;DME and/or AE instruction;Therapeutic activities;Patient/family education;Balance training;Splinting    OT Goals(Current goals can be found in the care plan section) Acute Rehab OT Goals Patient Stated Goal: to have less pain OT Goal Formulation: With patient Time For Goal Achievement: 05/30/21 Potential to Achieve Goals: Fair ADL Goals Pt Will Perform Upper Body Bathing: with min assist;bed level Pt Will Perform Lower Body Bathing: with max assist;with adaptive equipment;bed level Pt Will Perform Upper Body Dressing: with mod assist;bed level Pt/caregiver will Perform Home Exercise Program: Increased ROM;Increased strength;Both right and left upper extremity;With written HEP provided;With Supervision  OT Frequency: Min 2X/week   Barriers to D/C:  Co-evaluation              AM-PAC OT "6 Clicks" Daily Activity     Outcome Measure Help from another person eating meals?: A Little Help from another person taking care of personal grooming?: A Little Help from another person toileting, which includes using toliet, bedpan, or urinal?: Total Help from another person bathing (including washing, rinsing, drying)?: A Lot Help from another person to put on and taking off regular upper body clothing?: A Lot Help from another person  to put on and taking off regular lower body clothing?: Total 6 Click Score: 12   End of Session Nurse Communication: Mobility status  Activity Tolerance: Patient limited by pain Patient left: in bed;with call bell/phone within reach  OT Visit Diagnosis: Pain;Muscle weakness (generalized) (M62.81);Unsteadiness on feet (R26.81) Pain - Right/Left: Left Pain - part of body: Hip                Time: 1856-3149 OT Time Calculation (min): 45 min Charges:  OT General Charges $OT Visit: 1 Visit  Nilsa Nutting., OTR/L Acute Rehabilitation Services Pager [phone] Office [phone]   Lucille Passy M 05/16/2021, 12:25 PM

## 2021-05-16 NOTE — Progress Notes (Signed)
NAME:  Crystal Vega, MRN:  [id number], DOB:  1951/02/18, LOS: 1 ADMISSION DATE:  05/15/2021, CONSULTATION DATE:  12/16 REFERRING MD:  Dr. Karle Starch, CHIEF COMPLAINT:  ABLA; hypovolemic shock   History of Present Illness:  Patient is a 70 yo female with pertinent PMH of breast cancer undergoing chemo, colonic adenocarcinoma, iron deficiency anemia, DVT on Eliquis presents to [facility] ED on 12/16 with ABLA.  Patient was at [facility] where they noted on 12/16 her hemoglobin was low and sent to [facility] ED for further eval.  Patient reports more fatigued and some vaginal bleeding for about 2 weeks.  Denies any frank red bloody stools but does have hx of colon adenocarcinoma and has occasional bloody stools.  Denies N/V, fever.  Patient has history of DVT and is on Eliquis.  Patient has had a recent transvaginal ultrasound which showed thickened endometrium up to 19 mm with plans to receive a endometrial sampling to rule out carcinoma.  ED course: Patient a/o, hypotensive with SBP in 60s.  Pertinent labs: NA 125, BUN 62, creatinine 1.85, HGB 5, WBC 5.9, platelets 97.  COVID-positive.  Hemoccult positive.  DIC panel pending.  Given vitamin K, Megace, TXA.  Patient started on IV fluids and given a PRBC transfusion.  OB/GYN and GI consulted.  Started on Levophed.  PCCM consulted for ICU admission and transfer from [facility].  Pertinent  Medical History   Past Medical History:  Diagnosis Date   Anemia    Arthritis    per patient " left knee"   DVT (deep venous thrombosis) (HCC)    left leg   Dyspnea    Essential hypertension, benign    Family history of cancer of female genital organ    Family history of GI tract cancer    Metastatic breast cancer ([place])    left breast   Port-A-Cath in place 12/25/2019   Type 2 diabetes mellitus ([place])      Significant Hospital Events: Including procedures, antibiotic start and stop dates in addition to other pertinent events   12/17: transferring from [facility]  ED to [facility] for ICU admission; hypovolemic shock  Interim History / Subjective:  Patient alert and oriented Patient hgb now 9 On low dose of levo  Objective   Blood pressure 128/60, pulse 69, temperature 97.7 F (36.5 C), temperature source Oral, resp. rate 18, height 5\' 3"  (1.6 m), weight 120.8 kg, SpO2 100 %.        Intake/Output Summary (Last 24 hours) at 05/16/2021 1115 Last data filed at 05/16/2021 0600 Gross per 24 hour  Intake 1430 ml  Output 1850 ml  Net -420 ml    Filed Weights   05/15/21 1601 05/16/21 0244  Weight: 114.3 kg 120.8 kg    Examination: General:   NAD HEENT: MM pink/moist; [place] in place Neuro: Aox3; MAE CV: s1s2, RRR, no m/r/g PULM:  dim clear BS bilaterally; [place] 2L GI: soft, bsx4 active  Extremities: warm/dry, trace BLE edema; pain in left leg described as muscle spasms Skin: no rashes or lesions    Resolved Hospital Problem list     Assessment & Plan:  Hypovolemic shock  Vaginal bleeding: transvaginal u/s shows 19 mm thick endometrium; will need biopsy when stable to r/o uterine cancer Hemoccult positive: possible GI bleed ABLA on chronic iron deficient anemia Thrombocytopenia POA P: -admit to icu for closer monitoring -hold home eliquis -continue levo for MAP goal >65 -trend CBC -s/p 2u pRBC with robust response (hgb 5 to  9) -OBGYN consulted: given txa and megace -continue to monitor vaginal bleeding -GI consulted -PPI -vitamin K given -follow DIC panel  Septic Shock due to GNR bacteremia: CXR clear, UA clean. Line infection from port? --cefepime AM 12/17 --1L LR bolus --NE MAP > 65  AKI: Hypovolemia, ATN likely. Good UOP overnight with fluids. P: -IV fluids  -Trend BMP / urinary output -Replace electrolytes as indicated -Avoid nephrotoxic agents, ensure adequate renal perfusion  Hyponatremia P: -likely due to hypovolemia; consider further workup -trend BMP  Incidental Covid positive P: -covid precautions in  place -respiratory status stable -check CXR -denies DOE, no COVID directed therapies -pulm toiletry: IS -PT/OT  Hx of DVT P: -hold home eliquis -consider restarting when bleeding stable -SCDs  Hx of L breast cancer: chemotherapy outpatient Hx of R colon adenocarcinoma P: -continue to follow up outpatient  Hx of HTN, HLD P: -hold home anti-hypertensives (losartan and toprol) -hold home statin while npo  T2DM P: -SSI and cbg monitoring -check a1c  GERD P: -PPI  GOC P: -12/17: admitting provider spoke with patient with RN at bedside. Patient expressed she wishes to be DNR and not have any medications or compressions done if her heart were to stop. She also expressed she would not want to be intubated if her heart were to stop.  Best Practice (right click and "Reselect all SmartList Selections" daily)   Diet/type: Regular consistency (see orders) DVT prophylaxis: SCD GI prophylaxis: PPI Lines: Central line Foley:  N/A Code Status:  DNR Last date of multidisciplinary goals of care discussion [12/17: admitting provider spoke with patient with RN at bedside. Patient expressed she wishes to be DNR and not have any medications or compressions done if her heart were to stop. She also expressed she would not want to be intubated if her heart were to stop.]  Labs   CBC: Recent Labs  Lab 05/15/21 1658 05/16/21 0013  WBC 5.9 8.3  NEUTROABS 3.7 6.4  HGB 5.0* 9.3*  HCT 16.5* 28.9*  MCV 94.3 92.0  PLT 97* 103*   101*     Basic Metabolic Panel: Recent Labs  Lab 05/15/21 1659  NA 125*  K 4.6  CL 92*  CO2 25  GLUCOSE 125*  BUN 62*  CREATININE 1.85*  CALCIUM 7.4*    GFR: Estimated Creatinine Clearance: 36.2 mL/min (A) (by C-G formula based on SCr of 1.85 mg/dL (H)). Recent Labs  Lab 05/15/21 1658 05/15/21 2053 05/16/21 0013  WBC 5.9  --  8.3  LATICACIDVEN  --  1.3  --      Liver Function Tests: Recent Labs  Lab 05/15/21 1659  AST 28  ALT 12   ALKPHOS 119  BILITOT 0.1*  PROT 5.9*  ALBUMIN 1.8*    No results for input(s): LIPASE, AMYLASE in the last 168 hours. No results for input(s): AMMONIA in the last 168 hours.  ABG No results found for: PHART, PCO2ART, PO2ART, HCO3, TCO2, ACIDBASEDEF, O2SAT   Coagulation Profile: Recent Labs  Lab 05/15/21 1658 05/16/21 0013  INR 2.0* 1.9*     Cardiac Enzymes: No results for input(s): CKTOTAL, CKMB, CKMBINDEX, TROPONINI in the last 168 hours.  HbA1C: Hgb A1c MFr Bld  Date/Time Value Ref Range Status  04/28/2021 04:32 AM 5.5 4.8 - 5.6 % Final    Comment:    (NOTE)         Prediabetes: 5.7 - 6.4         Diabetes: >6.4  Glycemic control for adults with diabetes: <7.0   12/11/2019 02:35 PM 6.2 (H) 4.8 - 5.6 % Final    Comment:    (NOTE) Pre diabetes:          5.7%-6.4%  Diabetes:              >6.4%  Glycemic control for   <7.0% adults with diabetes     CBG: Recent Labs  Lab 05/16/21 0243 05/16/21 0604 05/16/21 0718  GLUCAP 185* 159* 159*    Review of Systems:   N/a   Past Medical History:  She,  has a past medical history of Anemia, Arthritis, DVT (deep venous thrombosis) ([place]), Dyspnea, Essential hypertension, benign, Family history of cancer of female genital organ, Family history of GI tract cancer, Metastatic breast cancer ([place]), Port-A-Cath in place (12/25/2019), and Type 2 diabetes mellitus ([place]).   Surgical History:   Past Surgical History:  Procedure Laterality Date   BIOPSY  11/27/2020   Procedure: BIOPSY;  Surgeon: Daneil Dolin, MD;  Location: [facility] END[facility];  Service: Endoscopy;;   CATARACT EXTRACTION W/ INTRAOCULAR LENS IMPLANT Right    COLONOSCOPY WITH PROPOFOL N/A 11/27/2020   Procedure: COLONOSCOPY WITH PROPOFOL;  Surgeon: Daneil Dolin, MD;  Location: [facility] ENDO [facility];  Service: Endoscopy;  Laterality: N/A;  9:00am   HYSTEROSCOPY WITH D & C N/A 06/05/2014   Procedure: DILATATION AND CURETTAGE /HYSTEROSCOPY;  Surgeon: Florian Buff, MD;  Location: [facility] ORS;  Service: Gynecology;  Laterality: N/A;   POLYPECTOMY N/A 06/05/2014   Procedure: ENDOMETRIAL POLYPECTOMY;  Surgeon: Florian Buff, MD;  Location: [facility] ORS;  Service: Gynecology;  Laterality: N/A;   PORTACATH PLACEMENT N/A 12/14/2019   Procedure: INSERTION PORT-A-CATH WITH ULTRASOUND GUIDANCE;  Surgeon: Donnie Mesa, MD;  Location: [facility];  Service: General;  Laterality: N/A;   TUBAL LIGATION       Social History:   reports that she has quit smoking. Her smoking use included cigarettes. She has never used smokeless tobacco. She reports that she does not drink alcohol and does not use drugs.   Family History:  Her family history includes Cancer in her cousin; Colon cancer (age of onset: 96) in her mother; Congestive Heart Failure in her father; Diabetes Mellitus II in her father; Stroke in her maternal grandmother.   Allergies No Known Allergies   Home Medications  Prior to Admission medications   Medication Sig Start Date End Date Taking? Authorizing Provider  acetaminophen (TYLENOL) 325 MG tablet Take 2 tablets (650 mg total) by mouth every 6 (six) hours as needed for mild pain (or Fever >/= 101). 05/05/21   Roxan Hockey, MD  amoxicillin (AMOXIL) 500 MG capsule Take 2 capsules (1,000 mg total) by mouth 3 (three) times daily for 10 days. 05/06/21 05/16/21  Roxan Hockey, MD  apixaban (ELIQUIS) 5 MG TABS tablet Take 1 tablet (5 mg total) by mouth 2 (two) times daily. 01/30/21   [name], MD  Ensure Max Protein (ENSURE MAX PROTEIN) LIQD Take 330 mLs (11 oz total) by mouth daily. 05/06/21   Roxan Hockey, MD  fentaNYL (DURAGESIC) 50 MCG/HR Place 1 patch onto the skin every 3 (three) days. 05/05/21   Roxan Hockey, MD  ferrous sulfate 325 (65 FE) MG tablet Take 325 mg by mouth daily with breakfast.    [provider]  folic acid (FOLVITE) 1 MG tablet TAKE 1 TABLET BY MOUTH EVERY DAY Patient taking differently: Take 1 mg by mouth daily.  02/09/21  [name], MD  gabapentin (NEURONTIN) 300 MG capsule Take 1 capsule (300 mg total) by mouth 3 (three) times daily. 05/05/21   Roxan Hockey, MD  HYDROmorphone (DILAUDID) 2 MG tablet Take 1 tablet (2 mg total) by mouth every 4 (four) hours as needed for severe pain. 05/05/21   Roxan Hockey, MD  KLOR-CON M10 10 MEQ tablet Take 10 mEq by mouth daily. 12/30/20   [provider]  lidocaine (LIDODERM) 5 % Place 1 patch onto the skin daily. Remove & Discard patch within 12 hours or as directed by MD 05/06/21   Roxan Hockey, MD  lidocaine-prilocaine (EMLA) cream Apply 1 application topically as needed. 04/02/21   [name], MD  losartan (COZAAR) 50 MG tablet Take 50 mg by mouth daily.    [provider]  metFORMIN (GLUCOPHAGE) 500 MG tablet Take 500 mg by mouth 2 (two) times daily with a meal.    [provider]  metoprolol succinate (TOPROL XL) 25 MG 24 hr tablet Take 1 tablet (25 mg total) by mouth daily. 05/05/21 05/05/22  Roxan Hockey, MD  nutrition supplement, JUVEN, (JUVEN) PACK Take 1 packet by mouth 2 (two) times daily between meals. 05/06/21   Roxan Hockey, MD  Nystatin (GERHARDT'S BUTT CREAM) CREA Apply 1 application topically 3 (three) times daily. 05/05/21   Roxan Hockey, MD  ondansetron (ZOFRAN) 4 MG tablet Take 1 tablet (4 mg total) by mouth daily as needed for nausea or vomiting. 05/05/21 05/05/22  Roxan Hockey, MD  polyethylene glycol (MIRALAX / GLYCOLAX) 17 g packet Take 17 g by mouth 2 (two) times daily. 05/05/21   Roxan Hockey, MD  pravastatin (PRAVACHOL) 10 MG tablet Take 10 mg by mouth daily.     [provider]  senna-docusate (SENOKOT-S) 8.6-50 MG tablet Take 2 tablets by mouth at bedtime. 05/05/21 05/05/22  Roxan Hockey, MD     Critical care time:     CRITICAL CARE Performed by: Lanier Clam   Total critical care time: 33 minutes  Critical care time was exclusive of separately  billable procedures and treating other patients.  Critical care was necessary to treat or prevent imminent or life-threatening deterioration.  Critical care was time spent personally by me on the following activities: development of treatment plan with patient and/or surrogate as well as nursing, discussions with consultants, evaluation of patient's response to treatment, examination of patient, obtaining history from patient or surrogate, ordering and performing treatments and interventions, ordering and review of laboratory studies, ordering and review of radiographic studies, pulse oximetry and re-evaluation of patient's condition.   Lanier Clam, MD [facility] 05/16/2021, 11:15 AM  Please see [email or web address] for pager details.  From 7A-7P if no response, please call [phone]. After hours, please call ELink [phone].

## 2021-05-16 NOTE — Evaluation (Signed)
Physical Therapy Evaluation Patient Details Name: Crystal Vega MRN: [id number] DOB: 05-23-1951 Today's Date: 05/16/2021  History of Present Illness  Patient is a 69 yo female admitted to [facility] ED on 12/16 with ABLA.  transfer to [facility] on 12/17 due to hypotensive with incr medical issues.  Also positive for COVID.   Pt was at [facility] since early December after a falll.  Patient reports more fatigued and some vaginal bleeding for about 2 weeks. PMH of breast cancer undergoing chemo, colonic adenocarcinoma, iron deficiency anemia, DVT on Eliquis  Clinical Impression  Pt admitted with above diagnosis. Pt was able to roll to be cleaned with mod to max assist of 2 persons.  Pt with too much left LE pain to sit EOB or do more than roll and some LE exercises. Will follow anjd progress pt as able.   Pt currently with functional limitations due to the deficits listed below (see PT Problem List). Pt will benefit from skilled PT to increase their independence and safety with mobility to allow discharge to the venue listed below.          Recommendations for follow up therapy are one component of a multi-disciplinary discharge planning process, led by the attending physician.  Recommendations may be updated based on patient status, additional functional criteria and insurance authorization.  Follow Up Recommendations Skilled nursing-short term rehab (<3 hours/day)    Assistance Recommended at Discharge Frequent or constant Supervision/Assistance  Functional Status Assessment Patient has had a recent decline in their functional status and/or demonstrates limited ability to make significant improvements in function in a reasonable and predictable amount of time  Equipment Recommendations  None recommended by PT    Recommendations for Other Services       Precautions / Restrictions Precautions Precautions: Fall Restrictions Weight Bearing Restrictions: No      Mobility  Bed  Mobility Overal bed mobility: Needs Assistance Bed Mobility: Rolling Rolling: Max assist;Mod assist;+2 for physical assistance         General bed mobility comments: Pt was wet with urine and needed all bed linens changed.  Pt was able to assit with roll right and left needing mod to max assist as pain limits her with sustaining lying on her side.  Rolled multiple times as pt was really wet. changed all dressings mepiplex and had nursing replace purewick and hope for better position than on arrival.    Transfers                   General transfer comment: unable at present    Ambulation/Gait                  Stairs            Wheelchair Mobility    Modified Rankin (Stroke Patients Only)       Balance                                             Pertinent Vitals/Pain Pain Assessment: Faces Faces Pain Scale: Hurts whole lot Pain Location: left knee and hip with any movement Pain Descriptors / Indicators: Grimacing;Guarding;Sore;Moaning;Crying Pain Intervention(s): Limited activity within patient's tolerance;Monitored during session;Repositioned;Patient requesting pain meds-RN notified    Home Living Family/patient expects to be discharged to:: Private residence Living Arrangements: Spouse/significant other Available Help at Discharge: Family;Available PRN/intermittently Type of Home:  House Home Access: Stairs to enter Entrance Stairs-Rails: None Entrance Stairs-Number of Steps: 1-2   Home Layout: One level Home Equipment: Conservation officer, nature (2 wheels);Cane - single point;Grab bars - tub/shower      Prior Function Prior Level of Function : Needs assist  Cognitive Assist : Mobility (cognitive);ADLs (cognitive) Mobility (Cognitive): Set up cues ADLs (Cognitive): Set up cues Physical Assist : Mobility (physical);ADLs (physical) Mobility (physical): Bed mobility;Transfers;Gait ADLs (physical): Bathing;Dressing;Toileting Mobility  Comments: household and short distanced community ambulator prior to [place] [facility] of this year, hasnt been  OOB since Nov per pt - was working her LES in facility. does not drive ADLs Comments: A with ADLs since the fall     Hand Dominance        Extremity/Trunk Assessment   Upper Extremity Assessment Upper Extremity Assessment: Defer to OT evaluation LUE Deficits / Details: grossly -3/5, baseline per patient due to nerve problem since Chemo therapy LUE Sensation: WNL LUE Coordination: decreased fine motor;decreased gross motor    Lower Extremity Assessment Lower Extremity Assessment: RLE deficits/detail;LLE deficits/detail RLE Deficits / Details: grossly 3-/5 LLE Deficits / Details: grossly 2-/5 mostly due to pain LLE: Unable to fully assess due to pain       Communication   Communication: No difficulties  Cognition Arousal/Alertness: Awake/alert Behavior During Therapy: WFL for tasks assessed/performed Overall Cognitive Status: Within Functional Limits for tasks assessed                                          General Comments General comments (skin integrity, edema, etc.): 77 bpm, 34, 138/116    Exercises General Exercises - Lower Extremity Ankle Circles/Pumps: Supine;Both;Strengthening;AROM;10 reps Quad Sets: AROM;Right;10 reps;Supine Heel Slides: AAROM;10 reps;AROM;Supine;Right Hip ABduction/ADduction: AAROM;Right;10 reps;Supine Straight Leg Raises: AROM;Right;AAROM;10 reps;Supine   Assessment/Plan    PT Assessment Patient needs continued PT services  PT Problem List Decreased strength;Decreased activity tolerance;Decreased balance;Decreased mobility;Decreased knowledge of use of DME;Decreased safety awareness;Decreased knowledge of precautions;Cardiopulmonary status limiting activity;Pain;Obesity;Decreased skin integrity       PT Treatment Interventions DME instruction;Stair training;Gait training;Functional mobility training;Therapeutic  activities;Therapeutic exercise;Patient/family education;Balance training    PT Goals (Current goals can be found in the Care Plan section)  Acute Rehab PT Goals Patient Stated Goal: go back to Rehab PT Goal Formulation: With patient Time For Goal Achievement: 05/30/21 Potential to Achieve Goals: Fair    Frequency Min 2X/week   Barriers to discharge Decreased caregiver support      Co-evaluation               AM-PAC PT "6 Clicks" Mobility  Outcome Measure Help needed turning from your back to your side while in a flat bed without using bedrails?: Total Help needed moving from lying on your back to sitting on the side of a flat bed without using bedrails?: Total Help needed moving to and from a bed to a chair (including a wheelchair)?: Total Help needed standing up from a chair using your arms (e.g., wheelchair or bedside chair)?: Total Help needed to walk in hospital room?: Total Help needed climbing 3-5 steps with a railing? : Total 6 Click Score: 6    End of Session Equipment Utilized During Treatment: Gait belt Activity Tolerance: Patient limited by fatigue;Patient limited by pain Patient left: in bed;with call bell/phone within reach Nurse Communication: Mobility status;Need for lift equipment PT Visit Diagnosis: Unsteadiness on feet (R26.81);Other abnormalities of  gait and mobility (R26.89);Muscle weakness (generalized) (M62.81)    Time: 3536-1443 PT Time Calculation (min) (ACUTE ONLY): 45 min   Charges:   PT Evaluation $PT Eval Moderate Complexity: 1 Mod PT Treatments $Therapeutic Activity: 8-22 mins        Peni Rupard M,PT Acute Rehab Services [phone] (pager)   Alvira Philips 05/16/2021, 11:21 AM

## 2021-05-16 NOTE — ED Notes (Signed)
[facility] at bedside 

## 2021-05-17 LAB — BLOOD CULTURE ID PANEL (REFLEXED) - BCID2

## 2021-05-17 LAB — BASIC METABOLIC PANEL
Anion gap: 6 (ref 5–15)
BUN: 23 mg/dL (ref 8–23)
CO2: 27 mmol/L (ref 22–32)
Calcium: 8 mg/dL — ABNORMAL LOW (ref 8.9–10.3)
Chloride: 102 mmol/L (ref 98–111)
Creatinine, Ser: 0.67 mg/dL (ref 0.44–1.00)
GFR, Estimated: >60 mL/min (ref >60)
Glucose, Bld: 98 mg/dL (ref 70–99)
Potassium: 3.9 mmol/L (ref 3.5–5.1)
Sodium: 135 mmol/L (ref 135–145)

## 2021-05-17 LAB — CBC
HCT: 22.7 % — ABNORMAL LOW (ref 36.0–46.0)
HCT: 23.9 % — ABNORMAL LOW (ref 36.0–46.0)
Hemoglobin: 7.4 g/dL — ABNORMAL LOW (ref 12.0–15.0)
Hemoglobin: 7.8 g/dL — ABNORMAL LOW (ref 12.0–15.0)
MCH: 28.8 pg (ref 26.0–34.0)
MCH: 29.4 pg (ref 26.0–34.0)
MCHC: 32.6 g/dL (ref 30.0–36.0)
MCHC: 32.6 g/dL (ref 30.0–36.0)
MCV: 88.3 fL (ref 80.0–100.0)
MCV: 90.2 fL (ref 80.0–100.0)
Platelets: 78 10*3/uL — ABNORMAL LOW (ref 150–400)
Platelets: 99 10*3/uL — ABNORMAL LOW (ref 150–400)
RBC: 2.57 MIL/uL — ABNORMAL LOW (ref 3.87–5.11)
RBC: 2.65 MIL/uL — ABNORMAL LOW (ref 3.87–5.11)
RDW: 16.6 % — ABNORMAL HIGH (ref 11.5–15.5)
RDW: 16.5 % — ABNORMAL HIGH (ref 11.5–15.5)
WBC: 6.1 10*3/uL (ref 4.0–10.5)
WBC: 7.3 10*3/uL (ref 4.0–10.5)
nRBC: 0 % (ref 0.0–0.2)
nRBC: 0 % (ref 0.0–0.2)

## 2021-05-17 LAB — GLUCOSE, CAPILLARY
Glucose-Capillary: 84 mg/dL (ref 70–99)
Glucose-Capillary: 91 mg/dL (ref 70–99)
Glucose-Capillary: 148 mg/dL — ABNORMAL HIGH (ref 70–99)
Glucose-Capillary: 132 mg/dL — ABNORMAL HIGH (ref 70–99)
Glucose-Capillary: 103 mg/dL — ABNORMAL HIGH (ref 70–99)

## 2021-05-17 LAB — BPAM RBC
Blood Product Expiration Date: 202301202359
Blood Product Expiration Date: 202301202359
ISSUE DATE / TIME: 202212161901
ISSUE DATE / TIME: 202212162116
Unit Type and Rh: 5100
Unit Type and Rh: 5100

## 2021-05-17 LAB — TYPE AND SCREEN
ABO/RH(D): O POS
Antibody Screen: NEGATIVE
Unit division: 0
Unit division: 0

## 2021-05-17 LAB — PROTIME-INR
INR: 1.3 — ABNORMAL HIGH (ref 0.8–1.2)
Prothrombin Time: 15.7 seconds — ABNORMAL HIGH (ref 11.4–15.2)

## 2021-05-17 MED ORDER — HYDROXYZINE HCL 25 MG PO TABS
25.0000 mg | ORAL_TABLET | Freq: Once | ORAL | Status: AC
  Administered 2021-05-17: 18:00:00 25 mg via ORAL
  Filled 2021-05-17: qty 1

## 2021-05-17 MED ORDER — SODIUM CHLORIDE 0.9 % IV SOLN
2.0000 g | INTRAVENOUS | Status: DC
  Administered 2021-05-17 – 2021-05-21 (×5): 2 g via INTRAVENOUS
  Filled 2021-05-17 (×5): qty 20

## 2021-05-17 MED ORDER — BUSPIRONE HCL 10 MG PO TABS: 10.0000 mg | ORAL_TABLET | Freq: Three times a day (TID) | ORAL | Status: DC | PRN

## 2021-05-17 MED ORDER — PRAVASTATIN SODIUM 10 MG PO TABS
10.0000 mg | ORAL_TABLET | Freq: Every day | ORAL | Status: DC
  Administered 2021-05-17 – 2021-05-25 (×10): 10 mg via ORAL
  Filled 2021-05-17 (×14): qty 1

## 2021-05-17 NOTE — Progress Notes (Addendum)
NAME:  Crystal Vega, MRN:  [id number], DOB:  10-Jun-1950, LOS: 2 ADMISSION DATE:  05/15/2021, CONSULTATION DATE:  12/16 REFERRING MD:  Dr. Karle Starch, CHIEF COMPLAINT:  ABLA; hypovolemic shock   History of Present Illness:  Patient is a 70 yo female with pertinent PMH of breast cancer undergoing chemo, colonic adenocarcinoma, iron deficiency anemia, DVT on Eliquis presents to [facility] ED on 12/16 with ABLA.  Patient was at [facility] where they noted on 12/16 her hemoglobin was low and sent to [facility] ED for further eval.  Patient reports more fatigued and some vaginal bleeding for about 2 weeks.  Denies any frank red bloody stools but does have hx of colon adenocarcinoma and has occasional bloody stools.  Denies N/V, fever.  Patient has history of DVT and is on Eliquis.  Patient has had a recent transvaginal ultrasound which showed thickened endometrium up to 19 mm with plans to receive a endometrial sampling to rule out carcinoma.  ED course: Patient a/o, hypotensive with SBP in 60s.  Pertinent labs: NA 125, BUN 62, creatinine 1.85, HGB 5, WBC 5.9, platelets 97.  COVID-positive.  Hemoccult positive.  DIC panel pending.  Given vitamin K, Megace, TXA.  Patient started on IV fluids and given a PRBC transfusion.  OB/GYN and GI consulted.  Started on Levophed.  PCCM consulted for ICU admission and transfer from [facility].  Pertinent  Medical History   Past Medical History:  Diagnosis Date   Anemia    Arthritis    per patient " left knee"   DVT (deep venous thrombosis) (HCC)    left leg   Dyspnea    Essential hypertension, benign    Family history of cancer of female genital organ    Family history of GI tract cancer    Metastatic breast cancer ([place])    left breast   Port-A-Cath in place 12/25/2019   Type 2 diabetes mellitus ([place])      Significant Hospital Events: Including procedures, antibiotic start and stop dates in addition to other pertinent events   12/17: transferring from [facility]  ED to [facility] for ICU admission; hypovolemic shock. Off NE 12/18 remains off NE   Interim History / Subjective:  Still off NE Hgb 7.4 from 8.1 Plt 78   Asking for a hot bath   Objective   Blood pressure 100/61, pulse 95, temperature 98.2 F (36.8 C), temperature source Oral, resp. rate 17, height 5\' 3"  (1.6 m), weight 120.8 kg, SpO2 100 %.        Intake/Output Summary (Last 24 hours) at 05/17/2021 0902 Last data filed at 05/17/2021 0700 Gross per 24 hour  Intake 250.14 ml  Output 1400 ml  Net -1149.86 ml   Filed Weights   05/15/21 1601 05/16/21 0244  Weight: 114.3 kg 120.8 kg    Examination: General:   Obese chronically ill NAD  HEENT: NCAT pink mm  Neuro:  AAOx3 following commands CV: rrr cap refill brisk  PULM:  diminished bibasilar sounds. Even and unlabored  GI: obese soft nontender  Extremities: no acute joint deformity. Trace lower extremity edema  Skin: pale c/d/w    Resolved Hospital Problem list   Hypovolemic shock due to blood loss  AKI Hyponatremia   Assessment & Plan:   Vaginal bleeding ABLA Blood in stool Thrombocytopenia  transvaginal u/s shows 19 mm thick endometrium; will need biopsy when stable to r/o uterine cancer + hemeoccult, recent bloody stool but not ongoing. Hx adenocarcinoma of colon and states this  happens  P: -holding home eliquis  -given 2PRBC, TXA, megace  -trend CBC  -monitor for vaginal bleeding   Septic shock due to E coli bacteremia, shock now improved   Line infection from port? P -Cefepime  Hx HTN Hx HLD P: -hold home meds, restart as able  incidental covid positive  Asymptomatic  P -no covid tx -on precautions   Hx of DVT P: -hold home eliquis -consider restarting when bleeding stable  Hx of L breast cancer: chemotherapy outpatient Hx of R colon adenocarcinoma P: -continue to follow up outpatient  DM2 P: -SSI  GERD P: -PPT  GOC DNR status P: -12/17: admitting provider spoke with patient  with RN at bedside. Patient expressed she wishes to be DNR and not have any medications or compressions done if her heart were to stop. She also expressed she would not want to be intubated if her heart were to stop.   Stable for transfer out of the ICU 12/18.  Will ask TRH to resume care 12/19.    Best Practice (right click and "Reselect all SmartList Selections" daily)   Diet/type: Regular consistency (see orders) DVT prophylaxis: SCD GI prophylaxis: PPI Lines: Central line Foley:  N/A Code Status:  DNR Last date of multidisciplinary goals of care discussion [12/17: admitting provider spoke with patient with RN at bedside. Patient expressed she wishes to be DNR and not have any medications or compressions done if her heart were to stop. She also expressed she would not want to be intubated if her heart were to stop.]  Labs   CBC: Recent Labs  Lab 05/15/21 1658 05/16/21 0013 05/16/21 1949 05/17/21 0441  WBC 5.9 8.3 7.1 6.1  NEUTROABS 3.7 6.4  --   --   HGB 5.0* 9.3* 8.1* 7.4*  HCT 16.5* 28.9* 25.5* 22.7*  MCV 94.3 92.0 90.1 88.3  PLT 97* 103*   101* PLATELET CLUMPS NOTED ON SMEAR, UNABLE TO ESTIMATE 78*    Basic Metabolic Panel: Recent Labs  Lab 05/15/21 1659 05/16/21 1948 05/17/21 0441  NA 125* 133* 135  K 4.6 4.0 3.9  CL 92* 101 102  CO2 25 24 27   GLUCOSE 125* 129* 98  BUN 62* 33* 23  CREATININE 1.85* 0.77 0.67  CALCIUM 7.4* 7.9* 8.0*  MG  --  1.5*  --    GFR: Estimated Creatinine Clearance: 83.6 mL/min (by C-G formula based on SCr of 0.67 mg/dL). Recent Labs  Lab 05/15/21 1658 05/15/21 2053 05/16/21 0013 05/16/21 1949 05/17/21 0441  WBC 5.9  --  8.3 7.1 6.1  LATICACIDVEN  --  1.3  --   --   --     Liver Function Tests: Recent Labs  Lab 05/15/21 1659 05/16/21 1948  AST 28 29  ALT 12 14  ALKPHOS 119 117  BILITOT 0.1* 0.8  PROT 5.9* 5.9*  ALBUMIN 1.8* 1.7*   No results for input(s): LIPASE, AMYLASE in the last 168 hours. No results for  input(s): AMMONIA in the last 168 hours.  ABG No results found for: PHART, PCO2ART, PO2ART, HCO3, TCO2, ACIDBASEDEF, O2SAT   Coagulation Profile: Recent Labs  Lab 05/15/21 1658 05/16/21 0013  INR 2.0* 1.9*    Cardiac Enzymes: No results for input(s): CKTOTAL, CKMB, CKMBINDEX, TROPONINI in the last 168 hours.  HbA1C: Hgb A1c MFr Bld  Date/Time Value Ref Range Status  05/16/2021 07:48 PM 5.5 4.8 - 5.6 % Final    Comment:    (NOTE) Pre diabetes:  5.7%-6.4%  Diabetes:              >6.4%  Glycemic control for   <7.0% adults with diabetes   04/28/2021 04:32 AM 5.5 4.8 - 5.6 % Final    Comment:    (NOTE)         Prediabetes: 5.7 - 6.4         Diabetes: >6.4         Glycemic control for adults with diabetes: <7.0     CBG: Recent Labs  Lab 05/16/21 1540 05/16/21 1924 05/16/21 2315 05/17/21 0315 05/17/21 0745  GLUCAP 81 114* 92 84 91   CCT: n/a  Eliseo Gum MSN, AGACNP-BC [place] for pager  05/17/2021, 10:06 AM

## 2021-05-17 NOTE — Progress Notes (Signed)
PHARMACY - PHYSICIAN COMMUNICATION CRITICAL VALUE ALERT - BLOOD CULTURE IDENTIFICATION (BCID)  Crystal Vega is an 70 y.o. female who presented to [facility] on 05/15/2021 with a chief complaint of anemia   Name of physician (or Provider) Contacted: Dr. Salley Slaughter  Current antibiotics: Cefepime  Changes to prescribed antibiotics recommended:  None, pt already on Cefepime for known E Coli bacteremia   Results for orders placed or performed during the hospital encounter of 05/15/21  Blood Culture ID Panel (Reflexed) (Collected: 05/16/2021 12:12 AM)  Result Value Ref Range   Enterococcus faecalis NOT DETECTED NOT DETECTED   Enterococcus Faecium NOT DETECTED NOT DETECTED   Listeria monocytogenes NOT DETECTED NOT DETECTED   Staphylococcus species NOT DETECTED NOT DETECTED   Staphylococcus aureus (BCID) NOT DETECTED NOT DETECTED   Staphylococcus epidermidis NOT DETECTED NOT DETECTED   Staphylococcus lugdunensis NOT DETECTED NOT DETECTED   Streptococcus species NOT DETECTED NOT DETECTED   Streptococcus agalactiae NOT DETECTED NOT DETECTED   Streptococcus pneumoniae NOT DETECTED NOT DETECTED   Streptococcus pyogenes NOT DETECTED NOT DETECTED   A.calcoaceticus-baumannii NOT DETECTED NOT DETECTED   Bacteroides fragilis NOT DETECTED NOT DETECTED   Enterobacterales DETECTED (A) NOT DETECTED   Enterobacter cloacae complex NOT DETECTED NOT DETECTED   Escherichia coli DETECTED (A) NOT DETECTED   Klebsiella aerogenes NOT DETECTED NOT DETECTED   Klebsiella oxytoca NOT DETECTED NOT DETECTED   Klebsiella pneumoniae NOT DETECTED NOT DETECTED   Proteus species NOT DETECTED NOT DETECTED   Salmonella species NOT DETECTED NOT DETECTED   Serratia marcescens NOT DETECTED NOT DETECTED   Haemophilus influenzae NOT DETECTED NOT DETECTED   Neisseria meningitidis NOT DETECTED NOT DETECTED   Pseudomonas aeruginosa NOT DETECTED NOT DETECTED   Stenotrophomonas maltophilia NOT DETECTED NOT DETECTED   Candida  albicans NOT DETECTED NOT DETECTED   Candida auris NOT DETECTED NOT DETECTED   Candida glabrata NOT DETECTED NOT DETECTED   Candida krusei NOT DETECTED NOT DETECTED   Candida parapsilosis NOT DETECTED NOT DETECTED   Candida tropicalis NOT DETECTED NOT DETECTED   Cryptococcus neoformans/gattii NOT DETECTED NOT DETECTED   CTX-M ESBL NOT DETECTED NOT DETECTED   Carbapenem resistance IMP NOT DETECTED NOT DETECTED   Carbapenem resistance KPC NOT DETECTED NOT DETECTED   Carbapenem resistance NDM NOT DETECTED NOT DETECTED   Carbapenem resist OXA 48 LIKE NOT DETECTED NOT DETECTED   Carbapenem resistance VIM NOT DETECTED NOT DETECTED    Narda Bonds, PharmD, BCPS Clinical Pharmacist Phone: [phone]

## 2021-05-18 ENCOUNTER — Inpatient Hospital Stay (HOSPITAL_COMMUNITY): Payer: Medicare HMO

## 2021-05-18 ENCOUNTER — Other Ambulatory Visit: Payer: Self-pay

## 2021-05-18 DIAGNOSIS — D649 Anemia, unspecified: Secondary | ICD-10-CM

## 2021-05-18 DIAGNOSIS — R6521 Severe sepsis with septic shock: Secondary | ICD-10-CM

## 2021-05-18 DIAGNOSIS — N950 Postmenopausal bleeding: Secondary | ICD-10-CM

## 2021-05-18 DIAGNOSIS — A419 Sepsis, unspecified organism: Secondary | ICD-10-CM

## 2021-05-18 DIAGNOSIS — R7881 Bacteremia: Secondary | ICD-10-CM

## 2021-05-18 DIAGNOSIS — B9620 Unspecified Escherichia coli [E. coli] as the cause of diseases classified elsewhere: Secondary | ICD-10-CM

## 2021-05-18 DIAGNOSIS — N939 Abnormal uterine and vaginal bleeding, unspecified: Secondary | ICD-10-CM

## 2021-05-18 LAB — FERRITIN: Ferritin: 151 ng/mL (ref 11–307)

## 2021-05-18 LAB — IRON AND TIBC
Iron: 34 ug/dL (ref 28–170)
Saturation Ratios: 15 % (ref 10.4–31.8)
TIBC: 224 ug/dL — ABNORMAL LOW (ref 250–450)
UIBC: 190 ug/dL

## 2021-05-18 LAB — CBC
HCT: 23.2 % — ABNORMAL LOW (ref 36.0–46.0)
HCT: 23.4 % — ABNORMAL LOW (ref 36.0–46.0)
Hemoglobin: 7.5 g/dL — ABNORMAL LOW (ref 12.0–15.0)
Hemoglobin: 7.4 g/dL — ABNORMAL LOW (ref 12.0–15.0)
MCH: 28.1 pg (ref 26.0–34.0)
MCH: 28.2 pg (ref 26.0–34.0)
MCHC: 32.3 g/dL (ref 30.0–36.0)
MCHC: 31.6 g/dL (ref 30.0–36.0)
MCV: 86.9 fL (ref 80.0–100.0)
MCV: 89.3 fL (ref 80.0–100.0)
Platelets: 102 10*3/uL — ABNORMAL LOW (ref 150–400)
Platelets: 96 10*3/uL — ABNORMAL LOW (ref 150–400)
RBC: 2.67 MIL/uL — ABNORMAL LOW (ref 3.87–5.11)
RBC: 2.62 MIL/uL — ABNORMAL LOW (ref 3.87–5.11)
RDW: 16.5 % — ABNORMAL HIGH (ref 11.5–15.5)
RDW: 17.2 % — ABNORMAL HIGH (ref 11.5–15.5)
WBC: 8.1 10*3/uL (ref 4.0–10.5)
WBC: 6.5 10*3/uL (ref 4.0–10.5)
nRBC: 0 % (ref 0.0–0.2)
nRBC: 0 % (ref 0.0–0.2)

## 2021-05-18 LAB — BASIC METABOLIC PANEL
Anion gap: 9 (ref 5–15)
BUN: 16 mg/dL (ref 8–23)
CO2: 22 mmol/L (ref 22–32)
Calcium: 7.7 mg/dL — ABNORMAL LOW (ref 8.9–10.3)
Chloride: 102 mmol/L (ref 98–111)
Creatinine, Ser: 0.64 mg/dL (ref 0.44–1.00)
GFR, Estimated: >60 mL/min (ref >60)
Glucose, Bld: 92 mg/dL (ref 70–99)
Potassium: 4.1 mmol/L (ref 3.5–5.1)
Sodium: 133 mmol/L — ABNORMAL LOW (ref 135–145)

## 2021-05-18 LAB — VITAMIN B12: Vitamin B-12: 456 pg/mL (ref 180–914)

## 2021-05-18 LAB — GLUCOSE, CAPILLARY
Glucose-Capillary: 110 mg/dL — ABNORMAL HIGH (ref 70–99)
Glucose-Capillary: 111 mg/dL — ABNORMAL HIGH (ref 70–99)
Glucose-Capillary: 115 mg/dL — ABNORMAL HIGH (ref 70–99)
Glucose-Capillary: 80 mg/dL (ref 70–99)
Glucose-Capillary: 87 mg/dL (ref 70–99)

## 2021-05-18 LAB — CULTURE, BLOOD (ROUTINE X 2): Special Requests: ADEQUATE

## 2021-05-18 LAB — FOLATE: Folate: 17.8 ng/mL (ref >5.9)

## 2021-05-18 LAB — RETICULOCYTES
Immature Retic Fract: 22.1 % — ABNORMAL HIGH (ref 2.3–15.9)
RBC.: 2.76 MIL/uL — ABNORMAL LOW (ref 3.87–5.11)
Retic Count, Absolute: 65.7 10*3/uL (ref 19.0–186.0)
Retic Ct Pct: 2.4 % (ref 0.4–3.1)

## 2021-05-18 LAB — MAGNESIUM: Magnesium: 0.9 mg/dL — CL (ref 1.7–2.4)

## 2021-05-18 LAB — PREPARE RBC (CROSSMATCH)

## 2021-05-18 MED ORDER — COLLAGENASE 250 UNIT/GM EX OINT
TOPICAL_OINTMENT | Freq: Every day | CUTANEOUS | Status: DC
  Filled 2021-05-18 (×4): qty 30

## 2021-05-18 MED ORDER — MEGESTROL ACETATE 40 MG PO TABS
80.0000 mg | ORAL_TABLET | Freq: Three times a day (TID) | ORAL | Status: DC
  Administered 2021-05-18 – 2021-05-26 (×22): 80 mg via ORAL
  Filled 2021-05-18 (×27): qty 2

## 2021-05-18 MED ORDER — MAGNESIUM SULFATE 4 GM/100ML IV SOLN
4.0000 g | Freq: Once | INTRAVENOUS | Status: AC
  Administered 2021-05-18: 16:00:00 4 g via INTRAVENOUS
  Filled 2021-05-18: qty 100

## 2021-05-18 MED ORDER — ADULT MULTIVITAMIN W/MINERALS CH
1.0000 | ORAL_TABLET | Freq: Every day | ORAL | Status: DC
  Administered 2021-05-18 – 2021-05-26 (×8): 1 via ORAL
  Filled 2021-05-18 (×9): qty 1

## 2021-05-18 MED ORDER — MAGNESIUM SULFATE 2 GM/50ML IV SOLN: 2.0000 g | Freq: Once | INTRAVENOUS | Status: DC

## 2021-05-18 MED ORDER — SODIUM CHLORIDE 0.9% IV SOLUTION: Freq: Once | INTRAVENOUS | Status: AC

## 2021-05-18 MED ORDER — PANTOPRAZOLE SODIUM 40 MG PO TBEC
40.0000 mg | DELAYED_RELEASE_TABLET | Freq: Two times a day (BID) | ORAL | Status: DC
  Administered 2021-05-18 – 2021-05-26 (×17): 40 mg via ORAL
  Filled 2021-05-18 (×17): qty 1

## 2021-05-18 MED ORDER — SODIUM CHLORIDE 0.9% FLUSH
10.0000 mL | Freq: Two times a day (BID) | INTRAVENOUS | Status: DC
  Administered 2021-05-18 (×2): 10 mL
  Administered 2021-05-19: 11:00:00 20 mL
  Administered 2021-05-19 – 2021-05-25 (×11): 10 mL
  Administered 2021-05-25: 10:00:00 20 mL
  Administered 2021-05-26: 09:00:00 10 mL

## 2021-05-18 MED ORDER — ENSURE ENLIVE PO LIQD
237.0000 mL | Freq: Three times a day (TID) | ORAL | Status: DC
  Administered 2021-05-18 – 2021-05-26 (×17): 237 mL via ORAL
  Filled 2021-05-18 (×2): qty 237

## 2021-05-18 MED ORDER — PROSOURCE PLUS PO LIQD
30.0000 mL | Freq: Two times a day (BID) | ORAL | Status: DC
  Administered 2021-05-19 – 2021-05-26 (×13): 30 mL via ORAL
  Filled 2021-05-18 (×14): qty 30

## 2021-05-18 MED ORDER — SODIUM CHLORIDE 0.9 % IV BOLUS
500.0000 mL | Freq: Once | INTRAVENOUS | Status: AC
  Administered 2021-05-18: 08:00:00 500 mL via INTRAVENOUS

## 2021-05-18 MED ORDER — SODIUM CHLORIDE 0.9 % IV BOLUS
500.0000 mL | Freq: Once | INTRAVENOUS | Status: AC
  Administered 2021-05-18: 09:00:00 500 mL via INTRAVENOUS

## 2021-05-18 MED ORDER — SODIUM CHLORIDE 0.9% FLUSH: 10.0000 mL | INTRAVENOUS | Status: DC | PRN

## 2021-05-18 MED ORDER — LIP MEDEX EX OINT
TOPICAL_OINTMENT | CUTANEOUS | Status: DC | PRN
  Administered 2021-05-26: 75 via TOPICAL
  Filled 2021-05-18 (×2): qty 7

## 2021-05-18 NOTE — Progress Notes (Signed)
Helle, RR, at bedside, Dr. Tyrell Antonio came to see pt and wrote transfer to [facility]. IV bolus and blood transfusing.

## 2021-05-18 NOTE — Significant Event (Signed)
Rapid Response Event Note   Reason for Call :  HR 140  Initial Focused Assessment:  Patient is alert and oriented.  She denies any chest pain or shortness of breath.  She is warm and dry.  BP 120/65  HR 140  RR 16  o2 sat 100% on ra CBG 110     Interventions:  1 u PRBC 1L NS bolus Foley catheter placed per orders  Plan of Care:  [facility] to [facility]  Event Summary:   MD Notified: Regalado at bedside Call Time: 0816 Arrival Time: 0820 End Time: 0920  Raliegh Ip, RN

## 2021-05-18 NOTE — Consult Note (Signed)
[facility]    Date of Admission:  05/15/2021     Reason for Consult: septic shock, ecoli bacteremia    Referring Provider: Jerald Kief    Lines:  Chronic port-a-cath  Abx: 12/18-c ceftriaxone  12/17 cefepime      12/07-17 amox 11/29-12/07 vanc-->ceftriaxone  Assessment: Ecoli bacteremia (R amp/sulb and cefazolin) Septic shock Incidental covid positive test Recent (11/29) high burden strep mitis/oralis bacteremia no TEE  Metastatic left cancer (left breast -- on outpatient chemo) Hx of right colon adenocx Hx dvt  70 yo female colon cancer/breast cancer on chemo (last cycle ?end of October -- on hold due to infection) here with vaginal bleeding/blood loss anemia and septic shock due to ecoli bacteremia  Agree source of the ecoli bacteremia is difficult (?gi from colon cancer vs port; no urinary sx and no urine cx obtained). Outside of culturing the port tip, would be impossible to determine source now  Also of concern is her recent strep mitis/oralis high burden bacteremia from 11/29. No culture repeat was done. The tte was negative. The source could have been the port/gut vs endocarditis.   At this juncture, would be prudent to remove the port given severe presentation/gram negative sepsis, and previous bacteremia of unclear source. Would also check TEE as she is at risk for endocarditis (marantic changes and new bacteremia of unclear source)  She had chronic dvt previously but less likely for that to be source of either of these bacteremic episodes  Plan: Continue ceftriaxone for now Please discuss case with oncology team -- I would recommend removing the port Would also get TEE - I will discuss with cards team tomorrow to setup; her eliquis was just stopped 12/16 Discussed with primary team    ------------------------------------------------ Principal Problem:   Circulatory failure ([place]) Active Problems:   Hypovolemic shock  ([place])   COVID-19   Symptomatic anemia   Vaginal bleeding   AKI (acute kidney injury) ([place])   Hyperglycemia due to diabetes mellitus ([place])   Gastrointestinal hemorrhage with melena    HPI: Crystal Vega is a 70 y.o. female metastatic colon cancer, breast cancer on active outpatient chemo, dvt on anticoagulation, presence of port-a-cath, recent strep mitis/oralis bacteremia, admitted with lab c/w acute blood loss anemia/hypovolumic-septic shock also found to have high burden ecoli bacteremia  Patient has been staying at a nursing home from recent hospital admission for vgs bacteremia. On 12/16 hb low sent to [facility] for further evaluation; reported 2 weeks vaginal bleeding in setting eliquis for her dvt, and assoicated malaise. No subjective fever or documented fever on presentation. Patient hypotensive to 60s though and hb 5.   Bcx returned with ecoli. Both sets peripheral.   Was in icu for shock and received blood transfusion and pressors. Anticoagulation hold. Obgyn consulted for vaginal bleeding -- given txa and megace. Started on appropriate abx. Overall clinically better. Off pressors and vaginal bleeding resolving -- transvaginal u/s thickened endometrium awaiting near future endometrial biopsy when stable  Covid incidental positive without respiratory sx or sign of pneumonitis. No specific tx started   She reports less vaginal bleeding now. She is currently getting another unit prbc as I am interviewing her. She is on the ward now  She also reports improving pain left hip/knee (had ct scan previous admission without fracture/metastatic concern.  Denies respiratory sx suggestive of covid/upper respiratory viral infection   With regard to her cancer hx, per 03/17/21 progress note from  hematology outpatient PRIOR THERAPY:  1. Paclitaxel x 6 cycles from 12/31/2019 to 06/02/2020. 2. Xeloda through 09/17/2020.   NGS Results: ER/HER-2 positive, PR negative, Ki-67 30%   CURRENT  THERAPY: Carboplatin & Taxol D1,8,15 every 4 weeks x 6 cycles (cycle 3 on 03/17/2021  Plan: 1.  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 - She had to receive G-CSF for day 15 of cycle 2. - She was able to take Cipro only for 2 days as it caused palpitations and also caused her "chest feel funny".  She still has some dysuria.  Will give Macrobid 100 mg twice daily for 5 days. - I have reviewed her labs today.  White count is 3.2 and hemoglobin is 8.1.  Platelet count is 158.  MCV is 88. - We will check ferritin and iron panel for normocytic anemia. - She will proceed with cycle 3 today, 3 weeks on/1 week off. - RTC 4 weeks with tumor markers and CT CAP prior to next visit.   2.  Right colonic adenocarcinoma: - Last CT scan showed slight worsening of right colon mass and adenopathy.  We are holding off on surgical management of colon cancer because of her advanced breast cancer.   Family History  Problem Relation Age of Onset   Diabetes Mellitus II Father    Congestive Heart Failure Father    Colon cancer Mother 7       patient not sure if colon vs stomach   Stroke Maternal Grandmother    Cancer Cousin        female reproductive cancer, dx. 50s/60s    Social History   Tobacco Use   Smoking status: Former    Types: Cigarettes   Smokeless tobacco: Never   Tobacco comments:    quit about 40+ years ago  Vaping Use   Vaping Use: Never used  Substance Use Topics   Alcohol use: Never   Drug use: No    No Known Allergies  Review of Systems: ROS All Other ROS was negative, except mentioned above   Past Medical History:  Diagnosis Date   Anemia    Arthritis    per patient " left knee"   DVT (deep venous thrombosis) (HCC)    left leg   Dyspnea    Essential hypertension, benign    Family history of cancer of female genital organ    Family history of GI tract cancer    Metastatic breast cancer ([place])    left  breast   Port-A-Cath in place 12/25/2019   Type 2 diabetes mellitus ([place])        Scheduled Meds:  Chlorhexidine Gluconate Cloth  6 each Topical Q0600   collagenase   Topical Daily   fentaNYL  1 patch Transdermal Q72H   insulin aspart  0-9 Units Subcutaneous Q4H   megestrol  120 mg Oral Daily   pantoprazole  40 mg Oral BID   pravastatin  10 mg Oral q1800   sodium chloride flush  10-40 mL Intracatheter Q12H   Continuous Infusions:  cefTRIAXone (ROCEPHIN)  IV 2 g (05/17/21 2106)   magnesium sulfate bolus IVPB     PRN Meds:.busPIRone, docusate sodium, oxyCODONE, polyethylene glycol, sodium chloride flush   OBJECTIVE: Blood pressure 128/75, pulse (!) 105, temperature 98.6 F (37 C), temperature source Oral, resp. rate 16, height 5' 3"  (1.6 m), weight 120.8 kg, SpO2 100 %.  Physical Exam  General/constitutional: no distress, pleasant,  obese HEENT: Normocephalic, PER, Conj Clear, EOMI, Oropharynx clear Neck supple CV: rrr no mrg Lungs: clear to auscultation, normal respiratory effort Abd: Soft, Nontender Ext: no edema Skin: No Rash Neuro: nonfocal MSK: no peripheral joint swelling/tenderness/warmth; back spines nontender Psych: alert/oriented  Central line presence: right chest port site no erythema/tenderness   Lab Results Lab Results  Component Value Date   WBC 6.5 05/18/2021   HGB 7.4 (L) 05/18/2021   HCT 23.4 (L) 05/18/2021   MCV 89.3 05/18/2021   PLT 96 (L) 05/18/2021    Lab Results  Component Value Date   CREATININE 0.64 05/18/2021   BUN 16 05/18/2021   NA 133 (L) 05/18/2021   K 4.1 05/18/2021   CL 102 05/18/2021   CO2 22 05/18/2021    Lab Results  Component Value Date   ALT 14 05/16/2021   AST 29 05/16/2021   ALKPHOS 117 05/16/2021   BILITOT 0.8 05/16/2021      Microbiology: Recent Results (from the past 240 hour(s))  Resp Panel by RT-PCR (Flu A&B, Covid) Nasopharyngeal Swab     Status: Abnormal   Collection Time: 05/15/21  6:50 PM    Specimen: Nasopharyngeal Swab; Nasopharyngeal(NP) swabs in vial transport medium  Result Value Ref Range Status   SARS Coronavirus 2 by RT PCR POSITIVE (A) NEGATIVE Final    Comment: RESULT CALLED TO, READ BACK BY AND VERIFIED WITH: SAPPELT,J ON 05/15/21 AT 1950 BY LOY,C (NOTE) SARS-CoV-2 target nucleic acids are DETECTED.  The SARS-CoV-2 RNA is generally detectable in upper respiratory specimens during the acute phase of infection. Positive results are indicative of the presence of the identified virus, but do not rule out bacterial infection or co-infection with other pathogens not detected by the test. Clinical correlation with patient history and other diagnostic information is necessary to determine patient infection status. The expected result is Negative.  Fact Sheet for Patients: EntrepreneurPulse.com.au  Fact Sheet for Healthcare Providers: IncredibleEmployment.be  This test is not yet approved or cleared by the [place] FDA and  has been authorized for detection and/or diagnosis of SARS-CoV-2 by FDA under an Emergency Use Authorization (EUA).  This EUA will remain in effect (meaning this test c an be used) for the duration of  the COVID-19 declaration under Section 564(b)(1) of the Act, 21 U.S.C. section 360bbb-3(b)(1), unless the authorization is terminated or revoked sooner.     Influenza A by PCR NEGATIVE NEGATIVE Final   Influenza B by PCR NEGATIVE NEGATIVE Final    Comment: (NOTE) The Xpert Xpress SARS-CoV-2/FLU/RSV plus assay is intended as an aid in the diagnosis of influenza from Nasopharyngeal swab specimens and should not be used as a sole basis for treatment. Nasal washings and aspirates are unacceptable for Xpert Xpress SARS-CoV-2/FLU/RSV testing.  Fact Sheet for Patients: EntrepreneurPulse.com.au  Fact Sheet for Healthcare Providers: IncredibleEmployment.be  This test is not  yet approved or cleared by the [place] FDA and has been authorized for detection and/or diagnosis of SARS-CoV-2 by FDA under an Emergency Use Authorization (EUA). This EUA will remain in effect (meaning this test can be used) for the duration of the COVID-19 declaration under Section 564(b)(1) of the Act, 21 U.S.C. section 360bbb-3(b)(1), unless the authorization is terminated or revoked.  Performed at [facility], [address], [place], [place] [address]   Blood culture (routine x 2)     Status: Abnormal   Collection Time: 05/15/21  8:53 PM   Specimen: BLOOD  Result Value Ref Range Status  Specimen Description   Final    BLOOD RIGHT ANTECUBITAL Performed at [facility] [address] [place], [place] [address]    Special Requests   Final    BOTTLES DRAWN AEROBIC AND ANAEROBIC Blood Culture adequate volume Performed at [facility], [address], [place], [place] [address]    Culture  Setup Time   Final    GRAM NEGATIVE RODS ANAEROBIC BOTTLE ONLY aerobic bottle GNR Gram Stain Report Called to,Read Back By and Verified With: DAWKINS @ 1020 ON [id number] BY HENDERSON L CRITICAL RESULT CALLED TO, READ BACK BY AND VERIFIED WITH: G,COFFEE PHARMD @1328  05/16/21 EB IN BOTH AEROBIC AND ANAEROBIC BOTTLES Performed at [facility], [place] [address], [place], [place] [address]    Culture ESCHERICHIA COLI (A)  Final   Report Status 05/18/2021 FINAL  Final   Organism ID, Bacteria ESCHERICHIA COLI  Final      Susceptibility   Escherichia coli - MIC*    AMPICILLIN >=32 RESISTANT Resistant     CEFAZOLIN >=64 RESISTANT Resistant     CEFEPIME <=0.12 SENSITIVE Sensitive     CEFTAZIDIME <=1 SENSITIVE Sensitive     CEFTRIAXONE 1 SENSITIVE Sensitive     CIPROFLOXACIN <=0.25 SENSITIVE Sensitive     GENTAMICIN <=1 SENSITIVE Sensitive     IMIPENEM <=0.25 SENSITIVE Sensitive     TRIMETH/SULFA <=20 SENSITIVE Sensitive     AMPICILLIN/SULBACTAM >=32 RESISTANT Resistant     * ESCHERICHIA  COLI  Blood Culture ID Panel (Reflexed)     Status: Abnormal   Collection Time: 05/15/21  8:53 PM  Result Value Ref Range Status   Enterococcus faecalis NOT DETECTED NOT DETECTED Final   Enterococcus Faecium NOT DETECTED NOT DETECTED Final   Listeria monocytogenes NOT DETECTED NOT DETECTED Final   Staphylococcus species NOT DETECTED NOT DETECTED Final   Staphylococcus aureus (BCID) NOT DETECTED NOT DETECTED Final   Staphylococcus epidermidis NOT DETECTED NOT DETECTED Final   Staphylococcus lugdunensis NOT DETECTED NOT DETECTED Final   Streptococcus species NOT DETECTED NOT DETECTED Final   Streptococcus agalactiae NOT DETECTED NOT DETECTED Final   Streptococcus pneumoniae NOT DETECTED NOT DETECTED Final   Streptococcus pyogenes NOT DETECTED NOT DETECTED Final   A.calcoaceticus-baumannii NOT DETECTED NOT DETECTED Final   Bacteroides fragilis NOT DETECTED NOT DETECTED Final   Enterobacterales DETECTED (A) NOT DETECTED Final    Comment: Enterobacterales represent a large order of gram negative bacteria, not a single organism. CRITICAL RESULT CALLED TO, READ BACK BY AND VERIFIED WITH: G,COFFEE PHARMD @1328  05/16/21 EB    Enterobacter cloacae complex NOT DETECTED NOT DETECTED Final   Escherichia coli DETECTED (A) NOT DETECTED Final    Comment: CRITICAL RESULT CALLED TO, READ BACK BY AND VERIFIED WITH: G,COFFEE PHARMD @1328  05/16/21 EB    Klebsiella aerogenes NOT DETECTED NOT DETECTED Final   Klebsiella oxytoca NOT DETECTED NOT DETECTED Final   Klebsiella pneumoniae NOT DETECTED NOT DETECTED Final   Proteus species NOT DETECTED NOT DETECTED Final   Salmonella species NOT DETECTED NOT DETECTED Final   Serratia marcescens NOT DETECTED NOT DETECTED Final   Haemophilus influenzae NOT DETECTED NOT DETECTED Final   Neisseria meningitidis NOT DETECTED NOT DETECTED Final   Pseudomonas aeruginosa NOT DETECTED NOT DETECTED Final   Stenotrophomonas maltophilia NOT DETECTED NOT DETECTED Final    Candida albicans NOT DETECTED NOT DETECTED Final   Candida auris NOT DETECTED NOT DETECTED Final   Candida glabrata NOT DETECTED NOT DETECTED Final   Candida krusei NOT DETECTED NOT  DETECTED Final   Candida parapsilosis NOT DETECTED NOT DETECTED Final   Candida tropicalis NOT DETECTED NOT DETECTED Final   Cryptococcus neoformans/gattii NOT DETECTED NOT DETECTED Final   CTX-M ESBL NOT DETECTED NOT DETECTED Final   Carbapenem resistance IMP NOT DETECTED NOT DETECTED Final   Carbapenem resistance KPC NOT DETECTED NOT DETECTED Final   Carbapenem resistance NDM NOT DETECTED NOT DETECTED Final   Carbapenem resist OXA 48 LIKE NOT DETECTED NOT DETECTED Final   Carbapenem resistance VIM NOT DETECTED NOT DETECTED Final    Comment: Performed at [facility], [place] [address], [place], [place] [address]  Blood culture (routine x 2)     Status: Abnormal (Preliminary result)   Collection Time: 05/16/21 12:12 AM   Specimen: Right Antecubital; Blood  Result Value Ref Range Status   Specimen Description   Final    RIGHT ANTECUBITAL BOTTLES DRAWN AEROBIC ONLY Performed at [facility], [address] [place], [place] [address]    Special Requests   Final    Blood Culture adequate volume Performed at [facility], [address] [place], [place] [address]    Culture  Setup Time   Final    GRAM NEGATIVE RODS AEROBIC BOTTLE ONLY Gram Stain Report Called to,Read Back By and Verified With: DAWKINS N @ 1410 ON 05/16/21 BY HENDERSON L CRITICAL RESULT CALLED TO, READ BACK BY AND VERIFIED WITH: PHARMD JAMES LEDFORD 05/17/21@3 :28 BY TW    Culture (A)  Final    ESCHERICHIA COLI SUSCEPTIBILITIES TO FOLLOW Performed at [facility], [place] [address], [place], [place] [address]    Report Status PENDING  Incomplete  Blood Culture ID Panel (Reflexed)     Status: Abnormal   Collection Time: 05/16/21 12:12 AM  Result Value Ref Range Status   Enterococcus faecalis NOT DETECTED NOT DETECTED Final    Enterococcus Faecium NOT DETECTED NOT DETECTED Final   Listeria monocytogenes NOT DETECTED NOT DETECTED Final   Staphylococcus species NOT DETECTED NOT DETECTED Final   Staphylococcus aureus (BCID) NOT DETECTED NOT DETECTED Final   Staphylococcus epidermidis NOT DETECTED NOT DETECTED Final   Staphylococcus lugdunensis NOT DETECTED NOT DETECTED Final   Streptococcus species NOT DETECTED NOT DETECTED Final   Streptococcus agalactiae NOT DETECTED NOT DETECTED Final   Streptococcus pneumoniae NOT DETECTED NOT DETECTED Final   Streptococcus pyogenes NOT DETECTED NOT DETECTED Final   A.calcoaceticus-baumannii NOT DETECTED NOT DETECTED Final   Bacteroides fragilis NOT DETECTED NOT DETECTED Final   Enterobacterales DETECTED (A) NOT DETECTED Final    Comment: Enterobacterales represent a large order of gram negative bacteria, not a single organism. CRITICAL RESULT CALLED TO, READ BACK BY AND VERIFIED WITH: PHARMD JAMES LEDFORD 05/17/21@3 :28 BY TW    Enterobacter cloacae complex NOT DETECTED NOT DETECTED Final   Escherichia coli DETECTED (A) NOT DETECTED Final    Comment: CRITICAL RESULT CALLED TO, READ BACK BY AND VERIFIED WITH: PHARMD JAMES LEDFORD 05/17/21@3 :28 BY TW    Klebsiella aerogenes NOT DETECTED NOT DETECTED Final   Klebsiella oxytoca NOT DETECTED NOT DETECTED Final   Klebsiella pneumoniae NOT DETECTED NOT DETECTED Final   Proteus species NOT DETECTED NOT DETECTED Final   Salmonella species NOT DETECTED NOT DETECTED Final   Serratia marcescens NOT DETECTED NOT DETECTED Final   Haemophilus influenzae NOT DETECTED NOT DETECTED Final   Neisseria meningitidis NOT DETECTED NOT DETECTED Final   Pseudomonas aeruginosa NOT DETECTED NOT DETECTED Final   Stenotrophomonas maltophilia NOT DETECTED NOT DETECTED Final  Candida albicans NOT DETECTED NOT DETECTED Final   Candida auris NOT DETECTED NOT DETECTED Final   Candida glabrata NOT DETECTED NOT DETECTED Final   Candida krusei NOT DETECTED  NOT DETECTED Final   Candida parapsilosis NOT DETECTED NOT DETECTED Final   Candida tropicalis NOT DETECTED NOT DETECTED Final   Cryptococcus neoformans/gattii NOT DETECTED NOT DETECTED Final   CTX-M ESBL NOT DETECTED NOT DETECTED Final   Carbapenem resistance IMP NOT DETECTED NOT DETECTED Final   Carbapenem resistance KPC NOT DETECTED NOT DETECTED Final   Carbapenem resistance NDM NOT DETECTED NOT DETECTED Final   Carbapenem resist OXA 48 LIKE NOT DETECTED NOT DETECTED Final   Carbapenem resistance VIM NOT DETECTED NOT DETECTED Final    Comment: Performed at [facility], [address], [place], [place] [address]  MRSA Next Gen by PCR, Nasal     Status: None   Collection Time: 05/16/21  2:43 AM   Specimen: Nasal Mucosa; Nasal Swab  Result Value Ref Range Status   MRSA by PCR Next Gen NOT DETECTED NOT DETECTED Final    Comment: (NOTE) The GeneXpert MRSA Assay (FDA approved for NASAL specimens only), is one component of a comprehensive MRSA colonization surveillance program. It is not intended to diagnose MRSA infection nor to guide or monitor treatment for MRSA infections. Test performance is not FDA approved in patients less than 76 years old. Performed at [facility], [place] [address], [place], [place] [address]      Serology:    Imaging: If present, new imagings (plain films, ct scans, and mri) have been personally visualized and interpreted; radiology reports have been reviewed. Decision making incorporated into the Impression / Recommendations.  11/28 ct abd pelv/chest with contrast 1. No acute findings within the chest. No pulmonary embolism is seen, with mild study limitations detailed above. No evidence of pneumonia or pulmonary edema. 2. Metastatic lymphadenopathy is redemonstrated within the mediastinum, bilateral axillae, and supraclavicular LEFT neck. Also redemonstrated is the mass versus conglomerate lymphadenopathy in the axillary tail region of  the LEFT breast. These findings are stable compared to the recent chest CT of 04/09/2021. 3. Lytic-appearing lesion within the T6 vertebral body, and destructive/lytic changes within the sternum, suspicious for metastatic osseous disease. Consider nuclear medicine bone scan or PET scan for confirmation. 4. Numerous small hypodense lesions within the bilateral liver lobes, as also described on the earlier CT abdomen report of 12/12/2019, presumably liver metastases. 5. Cholelithiasis without evidence of acute cholecystitis. 6. Colonic diverticulosis without evidence of acute diverticulitis. 7. No acute findings within the abdomen or pelvis. No bowel obstruction or evidence of acute bowel wall inflammation. No free fluid or abscess collection. No evidence of acute solid organ abnormality. 8. No evidence of acute osseous fracture or dislocation is seen.  11/28 ct left hip and left knee without contrast 1. No evidence of acute fracture or dislocation at the LEFT hip. 2. 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 1. 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 No fracture is identified, although sensitivity for subtle fractures is reduced due to the degree of spurring and associated cortical irregularities. 2. Moderate knee effusion. 3. Atherosclerosis. 4. Subcutaneous edema anterior to the patella.   Jabier Mutton, [facility] [place] [phone] pager    05/18/2021, 2:01 PM

## 2021-05-18 NOTE — Consult Note (Signed)
Reason for Consult: post menopausal vaginal bleeding   Crystal Vega is an 70 y.o. female postmenopausal female admitted with hypovolemic shock. Patient with active metastatic breast cancer and colon cancer admitted with acute blood loss anemia secondary to vaginal bleeding and septic shock. Patient reports a history of intermittent vaginal bleeding since her D&C in 2016. She states that her vaginal bleeding has worsened over the past 3 weeks, reporting heavy flow and passage of large clots. Patient reports some improvement in her vaginal bleeding with megace. Her most bothersome symptoms are sharp hip pains.   Pertinent Gynecological History: Menses: post-menopausal Bleeding: post menopausal bleeding Contraception: tubal ligation DES exposure: denies Blood transfusions: currently receiving Sexually transmitted diseases: no past history Previous GYN Procedures: DNC  Last mammogram: metastatic breast cancer   Menstrual History: No LMP recorded. Patient is postmenopausal.    Past Medical History:  Diagnosis Date   Anemia    Arthritis    per patient " left knee"   DVT (deep venous thrombosis) (HCC)    left leg   Dyspnea    Essential hypertension, benign    Family history of cancer of female genital organ    Family history of GI tract cancer    Metastatic breast cancer ([facility])    left breast   Port-A-Cath in place 12/25/2019   Type 2 diabetes mellitus [facility])     Past Surgical History:  Procedure Laterality Date   BIOPSY  11/27/2020   Procedure: BIOPSY;  Surgeon: Daneil Dolin, MD;  Location[facility];  Service: Endoscopy;;   CATARACT EXTRACTION W/ INTRAOCULAR LENS IMPLANT Right    COLONOSCOPY WITH PROPOFOL N/A 11/27/2020   Procedure: COLONOSCOPY WITH PROPOFOL;  Surgeon: Daneil Dolin, MD;  Location: [facility];  Service: Endoscopy;  Laterality: N/A;  9:00am   HYSTEROSCOPY WITH D & C N/A 06/05/2014   Procedure: DILATATION AND CURETTAGE /HYSTEROSCOPY;  Surgeon: Florian Buff, MD;  Location: [facility] ORS;  Service: Gynecology;  Laterality: N/A;   POLYPECTOMY N/A 06/05/2014   Procedure: ENDOMETRIAL POLYPECTOMY;  Surgeon: Florian Buff, MD;  Location: [facility] ORS;  Service: Gynecology;  Laterality: N/A;   PORTACATH PLACEMENT N/A 12/14/2019   Procedure: INSERTION PORT-A-CATH WITH ULTRASOUND GUIDANCE;  Surgeon: Donnie Mesa, MD;  Location: [facility];  Service: General;  Laterality: N/A;   TUBAL LIGATION      Family History  Problem Relation Age of Onset   Diabetes Mellitus II Father    Congestive Heart Failure Father    Colon cancer Mother 10       patient not sure if colon vs stomach   Stroke Maternal Grandmother    Cancer Cousin        female reproductive cancer, dx. 50s/60s    Social History:  reports that she has quit smoking. Her smoking use included cigarettes. She has never used smokeless tobacco. She reports that she does not drink alcohol and does not use drugs.  Allergies: No Known Allergies  Medications: I have reviewed the patient's current medications.  Review of Systems  Blood pressure 122/79, pulse 90, temperature 98 F (36.7 C), temperature source Oral, resp. rate 16, height 5\' 3"  (1.6 m), weight 120.8 kg, SpO2 100 %. Physical Exam  Results for orders placed or performed during the hospital encounter of 05/15/21 (from the past 48 hour(s))  Glucose, capillary     Status: None   Collection Time: 05/16/21  3:40 PM  Result Value Ref Range   Glucose-Capillary 81 70 -  99 mg/dL    Comment: Glucose reference range applies only to samples taken after fasting for at least 8 hours.  Type and screen [place]     Status: None (Preliminary result)   Collection Time: 05/16/21  6:15 PM  Result Value Ref Range   ABO/RH(D) O POS    Antibody Screen NEG    Sample Expiration 05/19/2021,2359    Unit Number [id number]    Blood Component Type RED CELLS,LR    Unit division 00    Status of Unit ISSUED    Transfusion Status OK TO TRANSFUSE     Crossmatch Result Compatible    Unit Number [id number]    Blood Component Type RED CELLS,LR    Unit division 00    Status of Unit ISSUED    Transfusion Status OK TO TRANSFUSE    Crossmatch Result      Compatible Performed at [facility], [place] [address] [place], [place] [address]   Glucose, capillary     Status: Abnormal   Collection Time: 05/16/21  7:24 PM  Result Value Ref Range   Glucose-Capillary 114 (H) 70 - 99 mg/dL    Comment: Glucose reference range applies only to samples taken after fasting for at least 8 hours.  Comprehensive metabolic panel     Status: Abnormal   Collection Time: 05/16/21  7:48 PM  Result Value Ref Range   Sodium 133 (L) 135 - 145 mmol/L   Potassium 4.0 3.5 - 5.1 mmol/L   Chloride 101 98 - 111 mmol/L   CO2 24 22 - 32 mmol/L   Glucose, Bld 129 (H) 70 - 99 mg/dL    Comment: Glucose reference range applies only to samples taken after fasting for at least 8 hours.   BUN 33 (H) 8 - 23 mg/dL   Creatinine, Ser 0.77 0.44 - 1.00 mg/dL   Calcium 7.9 (L) 8.9 - 10.3 mg/dL   Total Protein 5.9 (L) 6.5 - 8.1 g/dL   Albumin 1.7 (L) 3.5 - 5.0 g/dL   AST 29 15 - 41 U/L   ALT 14 0 - 44 U/L   Alkaline Phosphatase 117 38 - 126 U/L   Total Bilirubin 0.8 0.3 - 1.2 mg/dL   GFR, Estimated >60 >60 mL/min    Comment: (NOTE) Calculated using the CKD-EPI Creatinine Equation (2021)    Anion gap 8 5 - 15    Comment: Performed at [facility], [place] [address] [place], [place] [address]  Hemoglobin A1c     Status: None   Collection Time: 05/16/21  7:48 PM  Result Value Ref Range   Hgb A1c MFr Bld 5.5 4.8 - 5.6 %    Comment: (NOTE) Pre diabetes:          5.7%-6.4%  Diabetes:              >6.4%  Glycemic control for   <7.0% adults with diabetes    Mean Plasma Glucose 111.15 mg/dL    Comment: Performed at [facility] [address], [place], [place] [address]  Magnesium     Status: Abnormal   Collection Time: 05/16/21  7:48 PM  Result Value Ref  Range   Magnesium 1.5 (L) 1.7 - 2.4 mg/dL    Comment: Performed at [facility] [address] [place], [place] [address]  CBC     Status: Abnormal   Collection Time: 05/16/21  7:49 PM  Result Value Ref Range  WBC 7.1 4.0 - 10.5 K/uL   RBC 2.83 (L) 3.87 - 5.11 MIL/uL   Hemoglobin 8.1 (L) 12.0 - 15.0 g/dL   HCT 25.5 (L) 36.0 - 46.0 %   MCV 90.1 80.0 - 100.0 fL   MCH 28.6 26.0 - 34.0 pg   MCHC 31.8 30.0 - 36.0 g/dL   RDW 16.4 (H) 11.5 - 15.5 %   Platelets PLATELET CLUMPS NOTED ON SMEAR, UNABLE TO ESTIMATE 150 - 400 K/uL   nRBC 0.0 0.0 - 0.2 %    Comment: Performed at [facility] [address] [place], [place] [address]  Glucose, capillary     Status: None   Collection Time: 05/16/21 11:15 PM  Result Value Ref Range   Glucose-Capillary 92 70 - 99 mg/dL    Comment: Glucose reference range applies only to samples taken after fasting for at least 8 hours.  Glucose, capillary     Status: None   Collection Time: 05/17/21  3:15 AM  Result Value Ref Range   Glucose-Capillary 84 70 - 99 mg/dL    Comment: Glucose reference range applies only to samples taken after fasting for at least 8 hours.  CBC     Status: Abnormal   Collection Time: 05/17/21  4:41 AM  Result Value Ref Range   WBC 6.1 4.0 - 10.5 K/uL   RBC 2.57 (L) 3.87 - 5.11 MIL/uL   Hemoglobin 7.4 (L) 12.0 - 15.0 g/dL   HCT 22.7 (L) 36.0 - 46.0 %   MCV 88.3 80.0 - 100.0 fL   MCH 28.8 26.0 - 34.0 pg   MCHC 32.6 30.0 - 36.0 g/dL   RDW 16.6 (H) 11.5 - 15.5 %   Platelets 78 (L) 150 - 400 K/uL    Comment: Immature Platelet Fraction may be clinically indicated, consider ordering this additional test [id number] REPEATED TO VERIFY PLATELET COUNT CONFIRMED BY SMEAR PLATELET CLUMPS NOTED ON SMEAR, COUNT APPEARS ADEQUATE    nRBC 0.0 0.0 - 0.2 %    Comment: Performed at [facility] [address], [place], [place] [address]  Basic metabolic panel     Status: Abnormal   Collection Time: 05/17/21  4:41 AM   Result Value Ref Range   Sodium 135 135 - 145 mmol/L   Potassium 3.9 3.5 - 5.1 mmol/L   Chloride 102 98 - 111 mmol/L   CO2 27 22 - 32 mmol/L   Glucose, Bld 98 70 - 99 mg/dL    Comment: Glucose reference range applies only to samples taken after fasting for at least 8 hours.   BUN 23 8 - 23 mg/dL   Creatinine, Ser 0.67 0.44 - 1.00 mg/dL   Calcium 8.0 (L) 8.9 - 10.3 mg/dL   GFR, Estimated >60 >60 mL/min    Comment: (NOTE) Calculated using the CKD-EPI Creatinine Equation (2021)    Anion gap 6 5 - 15    Comment: Performed at [facility] [address], [place], [place] [address]  Glucose, capillary     Status: None   Collection Time: 05/17/21  7:45 AM  Result Value Ref Range   Glucose-Capillary 91 70 - 99 mg/dL    Comment: Glucose reference range applies only to samples taken after fasting for at least 8 hours.  Glucose, capillary     Status: Abnormal   Collection Time: 05/17/21 11:13 AM  Result Value Ref Range   Glucose-Capillary 148 (H) 70 - 99 mg/dL    Comment: Glucose reference  range applies only to samples taken after fasting for at least 8 hours.  CBC     Status: Abnormal   Collection Time: 05/17/21  1:51 PM  Result Value Ref Range   WBC 7.3 4.0 - 10.5 K/uL   RBC 2.65 (L) 3.87 - 5.11 MIL/uL   Hemoglobin 7.8 (L) 12.0 - 15.0 g/dL   HCT 23.9 (L) 36.0 - 46.0 %   MCV 90.2 80.0 - 100.0 fL   MCH 29.4 26.0 - 34.0 pg   MCHC 32.6 30.0 - 36.0 g/dL   RDW 16.5 (H) 11.5 - 15.5 %   Platelets 99 (L) 150 - 400 K/uL    Comment: Immature Platelet Fraction may be clinically indicated, consider ordering this additional test [id number] PLATELET COUNT CONFIRMED BY SMEAR    nRBC 0.0 0.0 - 0.2 %    Comment: Performed at [facility], [place] [address], [place], [place] [address]  Protime-INR     Status: Abnormal   Collection Time: 05/17/21  2:20 PM  Result Value Ref Range   Prothrombin Time 15.7 (H) 11.4 - 15.2 seconds   INR 1.3 (H) 0.8 - 1.2    Comment: (NOTE) INR goal varies  based on device and disease states. Performed at [facility], [place] [address], [place], [place] [address]   Glucose, capillary     Status: Abnormal   Collection Time: 05/17/21  4:12 PM  Result Value Ref Range   Glucose-Capillary 132 (H) 70 - 99 mg/dL    Comment: Glucose reference range applies only to samples taken after fasting for at least 8 hours.  Glucose, capillary     Status: Abnormal   Collection Time: 05/17/21  8:44 PM  Result Value Ref Range   Glucose-Capillary 103 (H) 70 - 99 mg/dL    Comment: Glucose reference range applies only to samples taken after fasting for at least 8 hours.  Glucose, capillary     Status: None   Collection Time: 05/18/21 12:20 AM  Result Value Ref Range   Glucose-Capillary 80 70 - 99 mg/dL    Comment: Glucose reference range applies only to samples taken after fasting for at least 8 hours.  CBC     Status: Abnormal   Collection Time: 05/18/21  1:59 AM  Result Value Ref Range   WBC 8.1 4.0 - 10.5 K/uL   RBC 2.67 (L) 3.87 - 5.11 MIL/uL   Hemoglobin 7.5 (L) 12.0 - 15.0 g/dL   HCT 23.2 (L) 36.0 - 46.0 %   MCV 86.9 80.0 - 100.0 fL   MCH 28.1 26.0 - 34.0 pg   MCHC 32.3 30.0 - 36.0 g/dL   RDW 16.5 (H) 11.5 - 15.5 %   Platelets 102 (L) 150 - 400 K/uL    Comment: Immature Platelet Fraction may be clinically indicated, consider ordering this additional test [id number] CONSISTENT WITH PREVIOUS RESULT REPEATED TO VERIFY    nRBC 0.0 0.0 - 0.2 %    Comment: Performed at [facility], [place] [address], [place], [place] [address]  Basic metabolic panel     Status: Abnormal   Collection Time: 05/18/21  1:59 AM  Result Value Ref Range   Sodium 133 (L) 135 - 145 mmol/L   Potassium 4.1 3.5 - 5.1 mmol/L   Chloride 102 98 - 111 mmol/L   CO2 22 22 - 32 mmol/L   Glucose, Bld 92 70 - 99 mg/dL    Comment: Glucose reference range applies only to samples taken  after fasting for at least 8 hours.   BUN 16 8 - 23 mg/dL   Creatinine, Ser 0.64 0.44 - 1.00  mg/dL   Calcium 7.7 (L) 8.9 - 10.3 mg/dL   GFR, Estimated >60 >60 mL/min    Comment: (NOTE) Calculated using the CKD-EPI Creatinine Equation (2021)    Anion gap 9 5 - 15    Comment: Performed at [facility] [address], [place], [place] [address]  Glucose, capillary     Status: None   Collection Time: 05/18/21  4:24 AM  Result Value Ref Range   Glucose-Capillary 87 70 - 99 mg/dL    Comment: Glucose reference range applies only to samples taken after fasting for at least 8 hours.  Magnesium     Status: Abnormal   Collection Time: 05/18/21  7:45 AM  Result Value Ref Range   Magnesium 0.9 (LL) 1.7 - 2.4 mg/dL    Comment: CRITICAL RESULT CALLED TO, READ BACK BY AND VERIFIED WITH: J MATOS RN BY SSTEPHENS [id number] Performed at [facility], [place] [address], [place], [place] [address]   Vitamin B12     Status: None   Collection Time: 05/18/21  7:45 AM  Result Value Ref Range   Vitamin B-12 456 180 - 914 pg/mL    Comment: (NOTE) This assay is not validated for testing neonatal or myeloproliferative syndrome specimens for Vitamin B12 levels. Performed at [facility], [place] [address], [place], [place] [address]   Iron and TIBC     Status: Abnormal   Collection Time: 05/18/21  7:45 AM  Result Value Ref Range   Iron 34 28 - 170 ug/dL   TIBC 224 (L) 250 - 450 ug/dL   Saturation Ratios 15 10.4 - 31.8 %   UIBC 190 ug/dL    Comment: Performed at [facility], [place] [address] [place], [place] [address]  Ferritin     Status: None   Collection Time: 05/18/21  7:45 AM  Result Value Ref Range   Ferritin 151 11 - 307 ng/mL    Comment: Performed at [facility], [place] [address], [place], [place] [address]  CBC     Status: Abnormal   Collection Time: 05/18/21  7:45 AM  Result Value Ref Range   WBC 6.5 4.0 - 10.5 K/uL   RBC 2.62 (L) 3.87 - 5.11 MIL/uL   Hemoglobin 7.4 (L) 12.0 - 15.0 g/dL   HCT 23.4 (L) 36.0 - 46.0 %   MCV 89.3 80.0 - 100.0 fL   MCH 28.2  26.0 - 34.0 pg   MCHC 31.6 30.0 - 36.0 g/dL   RDW 17.2 (H) 11.5 - 15.5 %   Platelets 96 (L) 150 - 400 K/uL    Comment: Immature Platelet Fraction may be clinically indicated, consider ordering this additional test [id number] CONSISTENT WITH PREVIOUS RESULT REPEATED TO VERIFY    nRBC 0.0 0.0 - 0.2 %    Comment: Performed at [facility], [place] [address], [place], [place] [address]  Prepare RBC (crossmatch)     Status: None   Collection Time: 05/18/21  7:45 AM  Result Value Ref Range   Order Confirmation      ORDER PROCESSED BY BLOOD BANK Performed at [facility], [place] [address] [place], [place] [address]   Prepare RBC (crossmatch)     Status: None   Collection Time: 05/18/21  8:15 AM  Result Value Ref Range  Order Confirmation      ORDER PROCESSED BY BLOOD BANK Performed at [facility], [place] [address], [place], [place] [address]   Glucose, capillary     Status: Abnormal   Collection Time: 05/18/21  8:34 AM  Result Value Ref Range   Glucose-Capillary 110 (H) 70 - 99 mg/dL    Comment: Glucose reference range applies only to samples taken after fasting for at least 8 hours.  Folate     Status: None   Collection Time: 05/18/21 11:45 AM  Result Value Ref Range   Folate 17.8 >5.9 ng/mL    Comment: Performed at [facility], [place] [address], [place], [place] [address]  Reticulocytes     Status: Abnormal   Collection Time: 05/18/21 11:45 AM  Result Value Ref Range   Retic Ct Pct 2.4 0.4 - 3.1 %   RBC. 2.76 (L) 3.87 - 5.11 MIL/uL   Retic Count, Absolute 65.7 19.0 - 186.0 K/uL   Immature Retic Fract 22.1 (H) 2.3 - 15.9 %    Comment: Performed at [facility] [address] [place], [place] [address]  Glucose, capillary     Status: Abnormal   Collection Time: 05/18/21 12:16 PM  Result Value Ref Range   Glucose-Capillary 111 (H) 70 - 99 mg/dL    Comment: Glucose reference range applies only to samples taken after fasting for at least 8 hours.    08/2020 ultrasound IMPRESSION: 1. Thickened endometrial stripe measuring up to 19.3 mm. In the setting of post-menopausal bleeding, endometrial sampling is indicated to exclude carcinoma. If results are benign, sonohysterogram should be considered for focal lesion work-up. (Ref: Radiological Reasoning: Algorithmic Workup of Abnormal Vaginal Bleeding with Endovaginal Sonography and Sonohysterography. AJR 2008; 403:K74-25). Please note that evaluation of the endometrial stripe is somewhat limited on this transabdominal only exam. 2. Two distinct partially calcified fibroids measuring up to 5.1 cm as above. 3. Normal sonographic appearance of the left ovary, with nonvisualization of the right ovary. No adnexal mass or free fluid.     Electronically Signed   By: Jeannine Boga M.D.   On: 09/24/2020 01:26 No results found.  Assessment/Plan: Postmenopausal vaginal bleeding - Intermittent vaginal bleeding since 2016. Discussed with patient concern for metastatic disease or primary endometrial cancer - Discussed attempted bedside endometrial biopsy or D&C in operating. Patient opted for D&C which I was able to schedule on Wednesday morning at 8:30 am - Risks, benefits and alternatives were reviewed with the patient including but not limited to risks of bleeding, infection, uterine perforation and damage to adjacent organs. Patient verbalized understanding and all questions were answered. Patient unable to sign consent due to difficulty with left hand and is requesting to wait for her husband. - Will continue megace for now at the following dose 80 mg TID - Continue management of e. coli bacteriemia per primary team with potential port removal   [place] 05/18/2021

## 2021-05-18 NOTE — Consult Note (Addendum)
[facility] Nurse Consult Note: Reason for Consult: Consult requested for left buttock. Pt is in isolation for Covid. Wound type: Unstageable pressure injury to left buttock; 5X3cm, 100% tightly adhered eschar, small amt tan drainage Right heel with 5X5cm dark red purple deep tissue pressure injury Pressure Injury POA: Yes Dressing procedure/placement/frequency: Pt could benefit from home health assistance for wound care after discharge; please order if desired.  Topical treatment orders provided for bedside nurses to perform as follows to assist with enzymatic debridement of nonviable tissue: Apply Santyl to left buttock wound Q day, then cover with moist gauze and foam dressing.  (Change foam dressing Q 3 days or PRN soiling.) Float heel to reduce pressure. Please re-consult if further assistance is needed.  Thank-you,  Julien Girt MSN, [place], [place], [place], [place]

## 2021-05-18 NOTE — Progress Notes (Signed)
Called [facility] (non urgent but letting her know), [place] and informed her of pts heart rate and history. Dr. Tyrell Antonio has written orders for bolus and to transfuse.

## 2021-05-18 NOTE — Progress Notes (Signed)
Initial Nutrition Assessment  DOCUMENTATION CODES:   Obesity unspecified  INTERVENTION:  - Encourage PO intake  - Ensure Enlive po BID, each supplement provides 350 kcal and 20 grams of protein (prefers vanilla)  - 30 ml ProSource Plus BID, each supplement provides 100 kcals and 15 grams protein  - Magic cup TID with meals, each supplement provides 290 kcal and 9 grams of protein  - MVI with minerals daily  NUTRITION DIAGNOSIS:   Increased nutrient needs related to chronic illness (metastatic breast cancer, colonic adenocarcinoma) as evidenced by estimated needs.  GOAL:   Patient will meet greater than or equal to 90% of their needs  MONITOR:   PO intake, Supplement acceptance, Labs, Weight trends  REASON FOR ASSESSMENT:   Malnutrition Screening Tool    ASSESSMENT:   Pt admitted from [facility] with ABLA and hypovolemic shock secondary to vaginal bleeding and septic shock d/t E. Coli bactermia. PMH includes metastatic breast cancer undergoing chemo, colonic adenocarcinoma, iron deficiency anemia, T2DM, and DVT. Incidentally COVID positive.  Per obstetrics MD, plans to have D&C on Wednesday morning d/t concern for metastatic disease or primary endometrial cancer.  Pt provided limited history d/t drowsiness and pain. She states that she has been unable to go to chemotherapy sessions d/t not feeling well. She reports having poor PO intake for quite some time. Pt reports sometimes she is able to eat 1 "good" meal but that is not consistent with no snacks throughout the day. Suspect that pt may not adequately meet nutritional needs, however will order supplements and continue to monitor PO intake and need for additional nutrition interventions.  She endorses weight loss but is unsure of how much. Per review of chart, her weight trends remain steady. Will continue to monitor trends throughout admission.  Medications: SSI, megace, protonix, IV abx  Labs: sodium  133, HgbA1C 5.5%, CBG's 80-148 x 24 hours  NUTRITION - FOCUSED PHYSICAL EXAM:  Flowsheet Row Most Recent Value  Orbital Region No depletion  Upper Arm Region No depletion  Thoracic and Lumbar Region No depletion  Buccal Region Mild depletion  Temple Region Mild depletion  Clavicle Bone Region No depletion  Clavicle and Acromion Bone Region No depletion  Scapular Bone Region No depletion  Dorsal Hand No depletion  Patellar Region No depletion  Anterior Thigh Region No depletion  Posterior Calf Region No depletion  Edema (RD Assessment) Mild  Hair Other (Comment)  [hair loss secondary to chemo]  Eyes Reviewed  Mouth Reviewed  Skin Reviewed  Nails Reviewed       Diet Order:   Diet Order             Diet NPO time specified Except for: Sips with Meds  Diet effective midnight           Diet regular Room service appropriate? Yes; Fluid consistency: Thin  Diet effective now                   EDUCATION NEEDS:   Not appropriate for education at this time  Skin:  Skin Assessment: Skin Integrity Issues: Skin Integrity Issues:: Other (Comment), DTI, Unstageable DTI: R heel Unstageable: L buttocks Other: MASD bilateral breast; non-pressure injury wound L breast  Last BM:  05/16/21  Height:   Ht Readings from Last 1 Encounters:  05/15/21 5\' 3"  (1.6 m)    Weight:   Wt Readings from Last 1 Encounters:  05/16/21 120.8 kg    BMI:  Body mass index is  47.18 kg/m.  Estimated Nutritional Needs:   Kcal:  1700-1900  Protein:  90-100g  Fluid:  >/=1.7L  Clayborne Dana, RDN, LDN Clinical Nutrition

## 2021-05-18 NOTE — Progress Notes (Addendum)
PROGRESS NOTE    Crystal Vega  LOV:[id number] DOB: 15-Jul-1950 DOA: 05/15/2021 PCP: Lemmie Evens, MD   Brief Narrative: 70 year old with past medical history significant for breast cancer undergoing chemo, colonic adenocarcinoma, iron deficiency anemia, DVT on Eliquis presents to [facility] ED on 12/16 with acute blood loss anemia.  Patient has been feeling more fatigue and report vaginal bleeding for about 2 weeks.  She denies frank red bloody stool but does have a history of colon adenocarcinoma and has occasional bloody stool.  Recent transvaginal ultrasound showed thickened endometrium up to 19 mm with plans to receive endometrial sampling to rule out carcinoma.  Patient was admitted with hypotension, systolic blood pressure in the 60.  Sodium 125 creatinine 1.8, hemoglobin of 5, platelets 97 COVID-positive.  OB/gynecology and GI consulted.  Patient received blood transfusion, started on Megace, she was also started on IV pressors Levophed.  She was admitted by the ICU team.  Patient stabilized, transferred to [facility] 12/19.   Assessment & Plan:   Principal Problem:   Circulatory failure ([place]) Active Problems:   Hypovolemic shock ([facility])   COVID-19   Symptomatic anemia   Vaginal bleeding   AKI (acute kidney injury) ([place])   Hyperglycemia due to diabetes mellitus ([place])   Gastrointestinal hemorrhage with melena  1-Hypovolemic Shock, secondary to Vaginal Bleeding. Acute Blood loss anemia;  Transvaginal US 09/24/2020; 19 mm thick endometrium.  -She will need endometrial biopsy/  -Holding Eliquis.  -Discussed with CCM, three was not a formal OB/GYN; started Megace, received TXA.  -Patient tachycardic HR up to 140. Reported large Blood clot yesterday.  -plan to transfuse 2 units PRBC, IV bolus.  -GYN consulted.   2-Tachycardia; in setting of hypovolemia, anemia vaginal bleed.  -PRBC transfusion and IV fluids.  Repeat labs today post transfusion.   3-Septic Shock, due to E coli  Bacteremia;  Blood culture positive for E coli 05/16/2021 ID consulted. They recommend oncology input in regards to [facility]. Page Dr Delton Coombes.  On IV ceftriaxone.  Discussed case with oncology, Dr. Delton Coombes.  He was okay with [place] removing the port.  He also think that patient is appropriate for palliative and hospice.  I will consult palliative care team for goals of care.   4-AKI;  Secondary to hypovolemia, ATN On IV fluids.  Resolved.   Hyponatremia: On IV fluids.   Incidental Covid:  Respiratory status stable.  COVID-19 Labs  Recent Labs    05/16/21 0013 05/18/21 0745  DDIMER 2.98*  --   FERRITIN  --  151    Lab Results  Component Value Date   SARSCOV2NAA POSITIVE (A) 05/15/2021   [place] NEGATIVE 04/27/2021   [place] NEGATIVE 12/29/2019   [place] NEGATIVE 12/11/2019     Hx of DVT;  Holding eliquis due to bleeding.  Resume when bleeding stable/resolved.    HTN, HLD; holding meds in setting of bleeding.   GERD; PPI.   DM type 2:  SSI.   Thrombocytopenia; monitor.  Severe hypomagnesemia; replete IV>  History of breast cancer and colon cancer.    Pressure Injury 04/27/21 Buttocks Right Stage 2 -  Partial thickness loss of dermis presenting as a shallow open injury with a red, pink wound bed without slough. (Active)  04/27/21 2230  Location: Buttocks  Location Orientation: Right  Staging: Stage 2 -  Partial thickness loss of dermis presenting as a shallow open injury with a red, pink wound bed without slough.  Wound Description (Comments):   Present on Admission: Yes  Pressure Injury 04/27/21 Buttocks Left Stage 2 -  Partial thickness loss of dermis presenting as a shallow open injury with a red, pink wound bed without slough. (Active)  04/27/21 2200  Location: Buttocks  Location Orientation: Left  Staging: Stage 2 -  Partial thickness loss of dermis presenting as a shallow open injury with a red, pink wound bed without slough.  Wound  Description (Comments):   Present on Admission: Yes     Pressure Injury 04/27/21 Buttocks Left Stage 2 -  Partial thickness loss of dermis presenting as a shallow open injury with a red, pink wound bed without slough. (Active)  04/27/21 2200  Location: Buttocks  Location Orientation: Left  Staging: Stage 2 -  Partial thickness loss of dermis presenting as a shallow open injury with a red, pink wound bed without slough.  Wound Description (Comments):   Present on Admission: Yes     Pressure Injury 05/16/21 Heel Right Deep Tissue Pressure Injury - Purple or maroon localized area of discolored intact skin or blood-filled blister due to damage of underlying soft tissue from pressure and/or shear. (Active)  05/16/21 0300  Location: Heel  Location Orientation: Right  Staging: Deep Tissue Pressure Injury - Purple or maroon localized area of discolored intact skin or blood-filled blister due to damage of underlying soft tissue from pressure and/or shear.  Wound Description (Comments):   Present on Admission: Yes     Pressure Injury 05/16/21 Buttocks Left Unstageable - Full thickness tissue loss in which the base of the injury is covered by slough (yellow, tan, gray, green or brown) and/or eschar (tan, brown or black) in the wound bed. (Active)  05/16/21 0300  Location: Buttocks  Location Orientation: Left  Staging: Unstageable - Full thickness tissue loss in which the base of the injury is covered by slough (yellow, tan, gray, green or brown) and/or eschar (tan, brown or black) in the wound bed.  Wound Description (Comments):   Present on Admission: Yes                  Estimated body mass index is 47.18 kg/m as calculated from the following:   Height as of this encounter: 5\' 3"  (1.6 m).   Weight as of this encounter: 120.8 kg.   DVT prophylaxis: SCD Code Status: DNR Family Communication: care discussed with patient.  Disposition Plan:  Status is: Inpatient  Remains  inpatient appropriate because: admitted with vaginal bleeding, hypovolemic shock. Still unstable tachycardic.         Consultants:  GYN CCM admitted patient.  ID   Procedures:  none  Antimicrobials:    Subjective: She is alert, not feeling well. Had large blood clot bigger tennis ball yesterday   Objective: Vitals:   05/17/21 2222 05/18/21 0013 05/18/21 0436 05/18/21 0600  BP: 127/65 129/72 (!) 87/65 (!) 130/91  Pulse: 79 (!) 105 (!) 109 (!) 140  Resp:    18  Temp: 98.6 F (37 C) 98.3 F (36.8 C) 98.2 F (36.8 C) 98.3 F (36.8 C)  TempSrc: Oral Oral Oral Oral  SpO2: 100% 100% 100% 100%  Weight:      Height:        Intake/Output Summary (Last 24 hours) at 05/18/2021 0737 Last data filed at 05/17/2021 1700 Gross per 24 hour  Intake 250 ml  Output 800 ml  Net -550 ml   Filed Weights   05/15/21 1601 05/16/21 0244  Weight: 114.3 kg 120.8 kg    Examination:  General exam:  Appears calm and comfortable  Respiratory system: Clear to auscultation. Respiratory effort normal. Cardiovascular system: S1 & S2 heard, RRR. No JVD, murmurs, rubs, gallops or clicks. No pedal edema. Gastrointestinal system: Abdomen is nondistended, soft and nontender. No organomegaly or masses felt. Normal bowel sounds heard. Central nervous system: Alert and oriented.  Extremities: Symmetric 5 x 5 power.   Data Reviewed: I have personally reviewed following labs and imaging studies  CBC: Recent Labs  Lab 05/15/21 1658 05/16/21 0013 05/16/21 1949 05/17/21 0441 05/17/21 1351 05/18/21 0159  WBC 5.9 8.3 7.1 6.1 7.3 8.1  NEUTROABS 3.7 6.4  --   --   --   --   HGB 5.0* 9.3* 8.1* 7.4* 7.8* 7.5*  HCT 16.5* 28.9* 25.5* 22.7* 23.9* 23.2*  MCV 94.3 92.0 90.1 88.3 90.2 86.9  PLT 97* 103*   101* PLATELET CLUMPS NOTED ON SMEAR, UNABLE TO ESTIMATE 78* 99* 858*   Basic Metabolic Panel: Recent Labs  Lab 05/15/21 1659 05/16/21 1948 05/17/21 0441 05/18/21 0159  NA 125* 133* 135 133*   K 4.6 4.0 3.9 4.1  CL 92* 101 102 102  CO2 25 24 27 22   GLUCOSE 125* 129* 98 92  BUN 62* 33* 23 16  CREATININE 1.85* 0.77 0.67 0.64  CALCIUM 7.4* 7.9* 8.0* 7.7*  MG  --  1.5*  --   --    GFR: Estimated Creatinine Clearance: 83.6 mL/min (by C-G formula based on SCr of 0.64 mg/dL). Liver Function Tests: Recent Labs  Lab 05/15/21 1659 05/16/21 1948  AST 28 29  ALT 12 14  ALKPHOS 119 117  BILITOT 0.1* 0.8  PROT 5.9* 5.9*  ALBUMIN 1.8* 1.7*   No results for input(s): LIPASE, AMYLASE in the last 168 hours. No results for input(s): AMMONIA in the last 168 hours. Coagulation Profile: Recent Labs  Lab 05/15/21 1658 05/16/21 0013 05/17/21 1420  INR 2.0* 1.9* 1.3*   Cardiac Enzymes: No results for input(s): CKTOTAL, CKMB, CKMBINDEX, TROPONINI in the last 168 hours. BNP (last 3 results) No results for input(s): PROBNP in the last 8760 hours. HbA1C: Recent Labs    05/16/21 1948  HGBA1C 5.5   CBG: Recent Labs  Lab 05/17/21 1113 05/17/21 1612 05/17/21 2044 05/18/21 0020 05/18/21 0424  GLUCAP 148* 132* 103* 80 87   Lipid Profile: No results for input(s): CHOL, HDL, LDLCALC, TRIG, CHOLHDL, LDLDIRECT in the last 72 hours. Thyroid Function Tests: No results for input(s): TSH, T4TOTAL, FREET4, T3FREE, THYROIDAB in the last 72 hours. Anemia Panel: No results for input(s): VITAMINB12, FOLATE, FERRITIN, TIBC, IRON, RETICCTPCT in the last 72 hours. Sepsis Labs: Recent Labs  Lab 05/15/21 2053  LATICACIDVEN 1.3    Recent Results (from the past 240 hour(s))  Resp Panel by RT-PCR (Flu A&B, Covid) Nasopharyngeal Swab     Status: Abnormal   Collection Time: 05/15/21  6:50 PM   Specimen: Nasopharyngeal Swab; Nasopharyngeal(NP) swabs in vial transport medium  Result Value Ref Range Status   SARS Coronavirus 2 by RT PCR POSITIVE (A) NEGATIVE Final    Comment: RESULT CALLED TO, READ BACK BY AND VERIFIED WITH: SAPPELT,J ON 05/15/21 AT 1950 BY LOY,C (NOTE) SARS-CoV-2 target  nucleic acids are DETECTED.  The SARS-CoV-2 RNA is generally detectable in upper respiratory specimens during the acute phase of infection. Positive results are indicative of the presence of the identified virus, but do not rule out bacterial infection or co-infection with other pathogens not detected by the test. Clinical correlation with patient history and other  diagnostic information is necessary to determine patient infection status. The expected result is Negative.  Fact Sheet for Patients: EntrepreneurPulse.com.au  Fact Sheet for Healthcare Providers: IncredibleEmployment.be  This test is not yet approved or cleared by the [place] FDA and  has been authorized for detection and/or diagnosis of SARS-CoV-2 by FDA under an Emergency Use Authorization (EUA).  This EUA will remain in effect (meaning this test c an be used) for the duration of  the COVID-19 declaration under Section 564(b)(1) of the Act, 21 U.S.C. section 360bbb-3(b)(1), unless the authorization is terminated or revoked sooner.     Influenza A by PCR NEGATIVE NEGATIVE Final   Influenza B by PCR NEGATIVE NEGATIVE Final    Comment: (NOTE) The Xpert Xpress SARS-CoV-2/FLU/RSV plus assay is intended as an aid in the diagnosis of influenza from Nasopharyngeal swab specimens and should not be used as a sole basis for treatment. Nasal washings and aspirates are unacceptable for Xpert Xpress SARS-CoV-2/FLU/RSV testing.  Fact Sheet for Patients: EntrepreneurPulse.com.au  Fact Sheet for Healthcare Providers: IncredibleEmployment.be  This test is not yet approved or cleared by the [place] FDA and has been authorized for detection and/or diagnosis of SARS-CoV-2 by FDA under an Emergency Use Authorization (EUA). This EUA will remain in effect (meaning this test can be used) for the duration of the COVID-19 declaration under Section  564(b)(1) of the Act, 21 U.S.C. section 360bbb-3(b)(1), unless the authorization is terminated or revoked.  Performed at [facility], [address] [place], [place] [address]   Blood culture (routine x 2)     Status: Abnormal (Preliminary result)   Collection Time: 05/15/21  8:53 PM   Specimen: BLOOD  Result Value Ref Range Status   Specimen Description   Final    BLOOD RIGHT ANTECUBITAL Performed at [facility], [address] [place], [place] [address]    Special Requests   Final    BOTTLES DRAWN AEROBIC AND ANAEROBIC Blood Culture adequate volume Performed at [facility], [address] [place], [place] [address]    Culture  Setup Time   Final    GRAM NEGATIVE RODS ANAEROBIC BOTTLE ONLY aerobic bottle GNR Gram Stain Report Called to,Read Back By and Verified With: DAWKINS @ 1020 ON [id number] BY HENDERSON L CRITICAL RESULT CALLED TO, READ BACK BY AND VERIFIED WITH: G,COFFEE PHARMD @1328  05/16/21 EB IN BOTH AEROBIC AND ANAEROBIC BOTTLES    Culture (A)  Final    ESCHERICHIA COLI SUSCEPTIBILITIES TO FOLLOW Performed at [facility], [place] [address], [place],  [address]    Report Status PENDING  Incomplete  Blood Culture ID Panel (Reflexed)     Status: Abnormal   Collection Time: 05/15/21  8:53 PM  Result Value Ref Range Status   Enterococcus faecalis NOT DETECTED NOT DETECTED Final   Enterococcus Faecium NOT DETECTED NOT DETECTED Final   Listeria monocytogenes NOT DETECTED NOT DETECTED Final   Staphylococcus species NOT DETECTED NOT DETECTED Final   Staphylococcus aureus (BCID) NOT DETECTED NOT DETECTED Final   Staphylococcus epidermidis NOT DETECTED NOT DETECTED Final   Staphylococcus lugdunensis NOT DETECTED NOT DETECTED Final   Streptococcus species NOT DETECTED NOT DETECTED Final   Streptococcus agalactiae NOT DETECTED NOT DETECTED Final   Streptococcus pneumoniae NOT DETECTED NOT DETECTED Final   Streptococcus pyogenes NOT DETECTED NOT DETECTED Final    A.calcoaceticus-baumannii NOT DETECTED NOT DETECTED Final   Bacteroides fragilis NOT DETECTED NOT DETECTED Final   Enterobacterales DETECTED (A) NOT DETECTED Final  Comment: Enterobacterales represent a large order of gram negative bacteria, not a single organism. CRITICAL RESULT CALLED TO, READ BACK BY AND VERIFIED WITH: G,COFFEE PHARMD @1328  05/16/21 EB    Enterobacter cloacae complex NOT DETECTED NOT DETECTED Final   Escherichia coli DETECTED (A) NOT DETECTED Final    Comment: CRITICAL RESULT CALLED TO, READ BACK BY AND VERIFIED WITH: G,COFFEE PHARMD @1328  05/16/21 EB    Klebsiella aerogenes NOT DETECTED NOT DETECTED Final   Klebsiella oxytoca NOT DETECTED NOT DETECTED Final   Klebsiella pneumoniae NOT DETECTED NOT DETECTED Final   Proteus species NOT DETECTED NOT DETECTED Final   Salmonella species NOT DETECTED NOT DETECTED Final   Serratia marcescens NOT DETECTED NOT DETECTED Final   Haemophilus influenzae NOT DETECTED NOT DETECTED Final   Neisseria meningitidis NOT DETECTED NOT DETECTED Final   Pseudomonas aeruginosa NOT DETECTED NOT DETECTED Final   Stenotrophomonas maltophilia NOT DETECTED NOT DETECTED Final   Candida albicans NOT DETECTED NOT DETECTED Final   Candida auris NOT DETECTED NOT DETECTED Final   Candida glabrata NOT DETECTED NOT DETECTED Final   Candida krusei NOT DETECTED NOT DETECTED Final   Candida parapsilosis NOT DETECTED NOT DETECTED Final   Candida tropicalis NOT DETECTED NOT DETECTED Final   Cryptococcus neoformans/gattii NOT DETECTED NOT DETECTED Final   CTX-M ESBL NOT DETECTED NOT DETECTED Final   Carbapenem resistance IMP NOT DETECTED NOT DETECTED Final   Carbapenem resistance KPC NOT DETECTED NOT DETECTED Final   Carbapenem resistance NDM NOT DETECTED NOT DETECTED Final   Carbapenem resist OXA 48 LIKE NOT DETECTED NOT DETECTED Final   Carbapenem resistance VIM NOT DETECTED NOT DETECTED Final    Comment: Performed at [facility], [address], [place], [place] [id number]  Blood culture (routine x 2)     Status: None (Preliminary result)   Collection Time: 05/16/21 12:12 AM   Specimen: Right Antecubital; Blood  Result Value Ref Range Status   Specimen Description   Final    RIGHT ANTECUBITAL BOTTLES DRAWN AEROBIC ONLY Performed at [facility], [address], [place], [place] [address]    Special Requests   Final    Blood Culture adequate volume Performed at [facility], [address] [place], [place] [address]    Culture  Setup Time   Final    GRAM NEGATIVE RODS AEROBIC BOTTLE ONLY Gram Stain Report Called to,Read Back By and Verified With: DAWKINS N @ 1410 ON 05/16/21 BY HENDERSON L CRITICAL RESULT CALLED TO, READ BACK BY AND VERIFIED WITH: PHARMD JAMES LEDFORD 05/17/21@3 :28 BY TW Performed at [facility], [place] [address] [place], [place] [address]    Culture   Final    NO GROWTH 2 DAYS Performed at [facility], [address], [place], [place] [address]    Report Status PENDING  Incomplete  Blood Culture ID Panel (Reflexed)     Status: Abnormal   Collection Time: 05/16/21 12:12 AM  Result Value Ref Range Status   Enterococcus faecalis NOT DETECTED NOT DETECTED Final   Enterococcus Faecium NOT DETECTED NOT DETECTED Final   Listeria monocytogenes NOT DETECTED NOT DETECTED Final   Staphylococcus species NOT DETECTED NOT DETECTED Final   Staphylococcus aureus (BCID) NOT DETECTED NOT DETECTED Final   Staphylococcus epidermidis NOT DETECTED NOT DETECTED Final   Staphylococcus lugdunensis NOT DETECTED NOT DETECTED Final   Streptococcus species NOT DETECTED NOT DETECTED Final   Streptococcus agalactiae NOT DETECTED NOT DETECTED Final   Streptococcus pneumoniae NOT DETECTED  NOT DETECTED Final   Streptococcus pyogenes NOT DETECTED NOT DETECTED Final   A.calcoaceticus-baumannii NOT DETECTED NOT DETECTED Final   Bacteroides fragilis NOT DETECTED NOT DETECTED Final   Enterobacterales DETECTED (A) NOT DETECTED Final     Comment: Enterobacterales represent a large order of gram negative bacteria, not a single organism. CRITICAL RESULT CALLED TO, READ BACK BY AND VERIFIED WITH: PHARMD JAMES LEDFORD 05/17/21@3 :28 BY TW    Enterobacter cloacae complex NOT DETECTED NOT DETECTED Final   Escherichia coli DETECTED (A) NOT DETECTED Final    Comment: CRITICAL RESULT CALLED TO, READ BACK BY AND VERIFIED WITH: PHARMD JAMES LEDFORD 05/17/21@3 :28 BY TW    Klebsiella aerogenes NOT DETECTED NOT DETECTED Final   Klebsiella oxytoca NOT DETECTED NOT DETECTED Final   Klebsiella pneumoniae NOT DETECTED NOT DETECTED Final   Proteus species NOT DETECTED NOT DETECTED Final   Salmonella species NOT DETECTED NOT DETECTED Final   Serratia marcescens NOT DETECTED NOT DETECTED Final   Haemophilus influenzae NOT DETECTED NOT DETECTED Final   Neisseria meningitidis NOT DETECTED NOT DETECTED Final   Pseudomonas aeruginosa NOT DETECTED NOT DETECTED Final   Stenotrophomonas maltophilia NOT DETECTED NOT DETECTED Final   Candida albicans NOT DETECTED NOT DETECTED Final   Candida auris NOT DETECTED NOT DETECTED Final   Candida glabrata NOT DETECTED NOT DETECTED Final   Candida krusei NOT DETECTED NOT DETECTED Final   Candida parapsilosis NOT DETECTED NOT DETECTED Final   Candida tropicalis NOT DETECTED NOT DETECTED Final   Cryptococcus neoformans/gattii NOT DETECTED NOT DETECTED Final   CTX-M ESBL NOT DETECTED NOT DETECTED Final   Carbapenem resistance IMP NOT DETECTED NOT DETECTED Final   Carbapenem resistance KPC NOT DETECTED NOT DETECTED Final   Carbapenem resistance NDM NOT DETECTED NOT DETECTED Final   Carbapenem resist OXA 48 LIKE NOT DETECTED NOT DETECTED Final   Carbapenem resistance VIM NOT DETECTED NOT DETECTED Final    Comment: Performed at [facility], [place] [address], [place], [place] [address]  MRSA Next Gen by PCR, Nasal     Status: None   Collection Time: 05/16/21  2:43 AM   Specimen: Nasal Mucosa; Nasal  Swab  Result Value Ref Range Status   MRSA by PCR Next Gen NOT DETECTED NOT DETECTED Final    Comment: (NOTE) The GeneXpert MRSA Assay (FDA approved for NASAL specimens only), is one component of a comprehensive MRSA colonization surveillance program. It is not intended to diagnose MRSA infection nor to guide or monitor treatment for MRSA infections. Test performance is not FDA approved in patients less than 4 years old. Performed at [facility], [place] [address], [place], [place] [address]          Radiology Studies: No results found.      Scheduled Meds:  Chlorhexidine Gluconate Cloth  6 each Topical Q0600   fentaNYL  1 patch Transdermal Q72H   insulin aspart  0-9 Units Subcutaneous Q4H   megestrol  120 mg Oral Daily   pantoprazole (PROTONIX) IV  40 mg Intravenous Q12H   pravastatin  10 mg Oral q1800   Continuous Infusions:  cefTRIAXone (ROCEPHIN)  IV 2 g (05/17/21 2106)     LOS: 3 days    Time spent: 35 minutes.     Elmarie Shiley, MD [facility]   If 7PM-7AM, please contact night-coverage [email or web address]  05/18/2021, 7:37 AM

## 2021-05-19 DIAGNOSIS — R000 Tachycardia, unspecified: Secondary | ICD-10-CM

## 2021-05-19 DIAGNOSIS — R571 Hypovolemic shock: Secondary | ICD-10-CM

## 2021-05-19 DIAGNOSIS — D62 Acute posthemorrhagic anemia: Secondary | ICD-10-CM

## 2021-05-19 LAB — BASIC METABOLIC PANEL
Anion gap: 5 (ref 5–15)
BUN: 11 mg/dL (ref 8–23)
CO2: 26 mmol/L (ref 22–32)
Calcium: 7.4 mg/dL — ABNORMAL LOW (ref 8.9–10.3)
Chloride: 104 mmol/L (ref 98–111)
Creatinine, Ser: 0.62 mg/dL (ref 0.44–1.00)
GFR, Estimated: >60 mL/min (ref >60)
Glucose, Bld: 109 mg/dL — ABNORMAL HIGH (ref 70–99)
Potassium: 3.6 mmol/L (ref 3.5–5.1)
Sodium: 135 mmol/L (ref 135–145)

## 2021-05-19 LAB — BPAM RBC
Blood Product Expiration Date: 202301122359
Blood Product Expiration Date: 202301122359
ISSUE DATE / TIME: 202212190811
ISSUE DATE / TIME: 202212191324
Unit Type and Rh: 5100
Unit Type and Rh: 5100

## 2021-05-19 LAB — TYPE AND SCREEN
ABO/RH(D): O POS
Antibody Screen: NEGATIVE
Unit division: 0
Unit division: 0

## 2021-05-19 LAB — GLUCOSE, CAPILLARY
Glucose-Capillary: 101 mg/dL — ABNORMAL HIGH (ref 70–99)
Glucose-Capillary: 127 mg/dL — ABNORMAL HIGH (ref 70–99)
Glucose-Capillary: 141 mg/dL — ABNORMAL HIGH (ref 70–99)
Glucose-Capillary: 168 mg/dL — ABNORMAL HIGH (ref 70–99)
Glucose-Capillary: 82 mg/dL (ref 70–99)
Glucose-Capillary: 98 mg/dL (ref 70–99)

## 2021-05-19 LAB — CBC
HCT: 27.3 % — ABNORMAL LOW (ref 36.0–46.0)
Hemoglobin: 8.8 g/dL — ABNORMAL LOW (ref 12.0–15.0)
MCH: 28.5 pg (ref 26.0–34.0)
MCHC: 32.2 g/dL (ref 30.0–36.0)
MCV: 88.3 fL (ref 80.0–100.0)
Platelets: 121 10*3/uL — ABNORMAL LOW (ref 150–400)
RBC: 3.09 MIL/uL — ABNORMAL LOW (ref 3.87–5.11)
RDW: 17.2 % — ABNORMAL HIGH (ref 11.5–15.5)
WBC: 6.9 10*3/uL (ref 4.0–10.5)
nRBC: 0 % (ref 0.0–0.2)

## 2021-05-19 LAB — CULTURE, BLOOD (ROUTINE X 2): Special Requests: ADEQUATE

## 2021-05-19 LAB — MAGNESIUM: Magnesium: 1.6 mg/dL — ABNORMAL LOW (ref 1.7–2.4)

## 2021-05-19 MED ORDER — SODIUM CHLORIDE 0.9 % IV SOLN
100.0000 mg | INTRAVENOUS | Status: AC
  Administered 2021-05-20 – 2021-05-21 (×2): 100 mg via INTRAVENOUS
  Filled 2021-05-19 (×2): qty 20

## 2021-05-19 MED ORDER — MAGNESIUM SULFATE 2 GM/50ML IV SOLN
2.0000 g | Freq: Once | INTRAVENOUS | Status: AC
  Administered 2021-05-19: 11:00:00 2 g via INTRAVENOUS
  Filled 2021-05-19: qty 50

## 2021-05-19 MED ORDER — SODIUM CHLORIDE 0.9 % IV SOLN
200.0000 mg | Freq: Once | INTRAVENOUS | Status: AC
  Administered 2021-05-20: 03:00:00 200 mg via INTRAVENOUS
  Filled 2021-05-19: qty 40

## 2021-05-19 NOTE — Plan of Care (Signed)
  Problem: Pain Managment: Goal: General experience of comfort will improve Outcome: Progressing   

## 2021-05-19 NOTE — Progress Notes (Addendum)
PROGRESS NOTE    Crystal Vega  QQI:[id number] DOB: 1950-06-22 DOA: 05/15/2021 PCP: Lemmie Evens, MD   Brief Narrative: 70 year old with past medical history significant for breast cancer undergoing chemo, colonic adenocarcinoma, iron deficiency anemia, DVT on Eliquis presents to [facility] ED on 12/16 with acute blood loss anemia.  Patient has been feeling more fatigue and report vaginal bleeding for about 2 weeks.  She denies frank red bloody stool but does have a history of colon adenocarcinoma and has occasional bloody stool.  Recent transvaginal ultrasound showed thickened endometrium up to 19 mm with plans to receive endometrial sampling to rule out carcinoma.  Patient was admitted with hypotension, systolic blood pressure in the 60.  Sodium 125 creatinine 1.8, hemoglobin of 5, platelets 97 COVID-positive.  OB/gynecology and GI consulted.  Patient received blood transfusion, started on Megace, she was also started on IV pressors Levophed.  She was admitted by the ICU team.  Patient stabilized, transferred to [facility] 12/19.  Admitted hemorrhagic and septic shock. Received Blood transfusion. GYN consulted, plan for D and C uterus 12/21. She was also found to have E coli Bacteremia. ID recommend Port cath removal. IR consulted. Plan for [facility] removal 12/21.  Assessment & Plan:   Principal Problem:   Circulatory failure ([place]) Active Problems:   E coli bacteremia   Hypovolemic shock (HCC)   COVID-19   Symptomatic anemia   Vaginal bleeding   AKI (acute kidney injury) ([place])   Hyperglycemia due to diabetes mellitus (HCC)   Gastrointestinal hemorrhage with melena   Severe sepsis with septic shock (HCC)   Postmenopausal vaginal bleeding  1-Hypovolemic Shock, secondary to Vaginal Bleeding. Acute Blood loss anemia;  Transvaginal US 09/24/2020; 19 mm thick endometrium.  -She will need endometrial biopsy/  -Holding Eliquis.  -Discussed with CCM, three was not a formal OB/GYN; started  Megace, received TXA.  -Patient tachycardic HR up to 140( 12/20) Reported large Blood clot 12/19.  -Received  2 units PRBC 12/19. -GYN consulted. Plan for D and C 12/21.  2-Tachycardia; in setting of hypovolemia, anemia vaginal bleed.  -PRBC transfusion and IV fluids.  Improved with IV fluids and blood transfusion.   3-Septic Shock, due to E coli Bacteremia;  Blood culture positive for E coli 05/16/2021 ID consulted. They recommend oncology input in regards to [facility]. Page Dr Delton Coombes.  On IV ceftriaxone.  Discussed case with oncology, Dr. Delton Coombes.  He was okay with [place] removing the port.  He also think that patient is appropriate for palliative and hospice.  I will consult palliative care team for goals of care. Plan for TEE on Thursday.  Port cath removal 12/21 by IR  4-AKI;  Secondary to hypovolemia, ATN On IV fluids.  Resolved.   Hyponatremia: Received fluids. Resolved.   Incidental Covid:  Respiratory status stable.  Will start Remdesivir for 3 days.  Patient report worsening cough COVID-19 Labs  Recent Labs    05/18/21 0745  FERRITIN 151     Lab Results  Component Value Date   SARSCOV2NAA POSITIVE (A) 05/15/2021   [place] NEGATIVE 04/27/2021   [place] NEGATIVE 12/29/2019   [place] NEGATIVE 12/11/2019      Hx of DVT;  Holding eliquis due to bleeding.  Resume when bleeding stable/resolved.   HTN, HLD; holding meds in setting of bleeding.   GERD; PPI.   DM type 2:  SSI.   Thrombocytopenia; monitor.  Severe hypomagnesemia; replete IV> again today Mg 0.9--16 History of breast cancer and colon cancer. Follow with  Dr Delton Coombes. Palliative care consulted.    Pressure Injury 05/16/21 Heel Right Deep Tissue Pressure Injury - Purple or maroon localized area of discolored intact skin or blood-filled blister due to damage of underlying soft tissue from pressure and/or shear. (Active)  05/16/21 0300  Location: Heel  Location Orientation: Right   Staging: Deep Tissue Pressure Injury - Purple or maroon localized area of discolored intact skin or blood-filled blister due to damage of underlying soft tissue from pressure and/or shear.  Wound Description (Comments):   Present on Admission: Yes     Pressure Injury 05/16/21 Buttocks Left Unstageable - Full thickness tissue loss in which the base of the injury is covered by slough (yellow, tan, gray, green or brown) and/or eschar (tan, brown or black) in the wound bed. (Active)  05/16/21 0300  Location: Buttocks  Location Orientation: Left  Staging: Unstageable - Full thickness tissue loss in which the base of the injury is covered by slough (yellow, tan, gray, green or brown) and/or eschar (tan, brown or black) in the wound bed.  Wound Description (Comments):   Present on Admission: Yes     Pressure Injury 05/18/21 Thigh Left;Posterior;Proximal Stage 2 -  Partial thickness loss of dermis presenting as a shallow open injury with a red, pink wound bed without slough. pink, red (Active)  05/18/21 1245  Location: Thigh  Location Orientation: Left;Posterior;Proximal  Staging: Stage 2 -  Partial thickness loss of dermis presenting as a shallow open injury with a red, pink wound bed without slough.  Wound Description (Comments): pink, red  Present on Admission: Yes     Pressure Injury 05/18/21 Buttocks Medial Stage 2 -  Partial thickness loss of dermis presenting as a shallow open injury with a red, pink wound bed without slough. red pink (Active)  05/18/21 1245  Location: Buttocks  Location Orientation: Medial  Staging: Stage 2 -  Partial thickness loss of dermis presenting as a shallow open injury with a red, pink wound bed without slough.  Wound Description (Comments): red pink  Present on Admission: Yes     Nutrition Problem: Increased nutrient needs Etiology: chronic illness (metastatic breast cancer, colonic adenocarcinoma)    Signs/Symptoms: estimated  needs    Interventions: Ensure Enlive (each supplement provides 350kcal and 20 grams of protein), Prostat, MVI, Magic cup  Estimated body mass index is 47.18 kg/m as calculated from the following:   Height as of this encounter: 5\' 3"  (1.6 m).   Weight as of this encounter: 120.8 kg.   DVT prophylaxis: SCD Code Status: DNR Family Communication: care discussed with patient. Husband updated over phone.  Disposition Plan:  Status is: Inpatient  Remains inpatient appropriate because: admitted with vaginal bleeding, hypovolemic shock. Still unstable tachycardic.         Consultants:  GYN CCM admitted patient.  ID   Procedures:  none  Antimicrobials:    Subjective: She is feeling better. Per nurse vaginal bleeding mild to moderate.   Objective: Vitals:   05/18/21 2100 05/19/21 0416 05/19/21 0829 05/19/21 1218  BP: 133/78 (!) 151/81 134/85 135/80  Pulse: (!) 101 96 (!) 103 (!) 108  Resp: 17 16 17 18   Temp: 98.2 F (36.8 C) 98.7 F (37.1 C) 98.2 F (36.8 C) 97.9 F (36.6 C)  TempSrc: Oral Oral Oral Oral  SpO2: 100% 98% 97% 98%  Weight:      Height:        Intake/Output Summary (Last 24 hours) at 05/19/2021 1459 Last data filed at 05/18/2021  2110 Gross per 24 hour  Intake 100 ml  Output 900 ml  Net -800 ml    Filed Weights   05/15/21 1601 05/16/21 0244  Weight: 114.3 kg 120.8 kg    Examination:  General exam: NAD Respiratory system: CTA Cardiovascular system: S 1, S 2 RRR Gastrointestinal system: BS present, soft , nt Central nervous system: alert Extremities: no edema   Data Reviewed: I have personally reviewed following labs and imaging studies  CBC: Recent Labs  Lab 05/15/21 1658 05/16/21 0013 05/16/21 1949 05/17/21 0441 05/17/21 1351 05/18/21 0159 05/18/21 0745 05/19/21 0503  WBC 5.9 8.3   < > 6.1 7.3 8.1 6.5 6.9  NEUTROABS 3.7 6.4  --   --   --   --   --   --   HGB 5.0* 9.3*   < > 7.4* 7.8* 7.5* 7.4* 8.8*  HCT 16.5* 28.9*   <  > 22.7* 23.9* 23.2* 23.4* 27.3*  MCV 94.3 92.0   < > 88.3 90.2 86.9 89.3 88.3  PLT 97* 103*   101*   < > 78* 99* 102* 96* 121*   < > = values in this interval not displayed.    Basic Metabolic Panel: Recent Labs  Lab 05/15/21 1659 05/16/21 1948 05/17/21 0441 05/18/21 0159 05/18/21 0745 05/19/21 0503  NA 125* 133* 135 133*  --  135  K 4.6 4.0 3.9 4.1  --  3.6  CL 92* 101 102 102  --  104  CO2 25 24 27 22   --  26  GLUCOSE 125* 129* 98 92  --  109*  BUN 62* 33* 23 16  --  11  CREATININE 1.85* 0.77 0.67 0.64  --  0.62  CALCIUM 7.4* 7.9* 8.0* 7.7*  --  7.4*  MG  --  1.5*  --   --  0.9* 1.6*    GFR: Estimated Creatinine Clearance: 83.6 mL/min (by C-G formula based on SCr of 0.62 mg/dL). Liver Function Tests: Recent Labs  Lab 05/15/21 1659 05/16/21 1948  AST 28 29  ALT 12 14  ALKPHOS 119 117  BILITOT 0.1* 0.8  PROT 5.9* 5.9*  ALBUMIN 1.8* 1.7*    No results for input(s): LIPASE, AMYLASE in the last 168 hours. No results for input(s): AMMONIA in the last 168 hours. Coagulation Profile: Recent Labs  Lab 05/15/21 1658 05/16/21 0013 05/17/21 1420  INR 2.0* 1.9* 1.3*    Cardiac Enzymes: No results for input(s): CKTOTAL, CKMB, CKMBINDEX, TROPONINI in the last 168 hours. BNP (last 3 results) No results for input(s): PROBNP in the last 8760 hours. HbA1C: Recent Labs    05/16/21 1948  HGBA1C 5.5    CBG: Recent Labs  Lab 05/18/21 2129 05/19/21 0016 05/19/21 0414 05/19/21 0827 05/19/21 1215  GLUCAP 115* 127* 101* 82 141*    Lipid Profile: No results for input(s): CHOL, HDL, LDLCALC, TRIG, CHOLHDL, LDLDIRECT in the last 72 hours. Thyroid Function Tests: No results for input(s): TSH, T4TOTAL, FREET4, T3FREE, THYROIDAB in the last 72 hours. Anemia Panel: Recent Labs    05/18/21 0745 05/18/21 1145  VITAMINB12 456  --   FOLATE  --  17.8  FERRITIN 151  --   TIBC 224*  --   IRON 34  --   RETICCTPCT  --  2.4   Sepsis Labs: Recent Labs  Lab  05/15/21 2053  LATICACIDVEN 1.3     Recent Results (from the past 240 hour(s))  Resp Panel by RT-PCR (Flu A&B, Covid) Nasopharyngeal Swab  Status: Abnormal   Collection Time: 05/15/21  6:50 PM   Specimen: Nasopharyngeal Swab; Nasopharyngeal(NP) swabs in vial transport medium  Result Value Ref Range Status   SARS Coronavirus 2 by RT PCR POSITIVE (A) NEGATIVE Final    Comment: RESULT CALLED TO, READ BACK BY AND VERIFIED WITH: SAPPELT,J ON 05/15/21 AT 1950 BY LOY,C (NOTE) SARS-CoV-2 target nucleic acids are DETECTED.  The SARS-CoV-2 RNA is generally detectable in upper respiratory specimens during the acute phase of infection. Positive results are indicative of the presence of the identified virus, but do not rule out bacterial infection or co-infection with other pathogens not detected by the test. Clinical correlation with patient history and other diagnostic information is necessary to determine patient infection status. The expected result is Negative.  Fact Sheet for Patients: EntrepreneurPulse.com.au  Fact Sheet for Healthcare Providers: IncredibleEmployment.be  This test is not yet approved or cleared by the [place] FDA and  has been authorized for detection and/or diagnosis of SARS-CoV-2 by FDA under an Emergency Use Authorization (EUA).  This EUA will remain in effect (meaning this test c an be used) for the duration of  the COVID-19 declaration under Section 564(b)(1) of the Act, 21 U.S.C. section 360bbb-3(b)(1), unless the authorization is terminated or revoked sooner.     Influenza A by PCR NEGATIVE NEGATIVE Final   Influenza B by PCR NEGATIVE NEGATIVE Final    Comment: (NOTE) The Xpert Xpress SARS-CoV-2/FLU/RSV plus assay is intended as an aid in the diagnosis of influenza from Nasopharyngeal swab specimens and should not be used as a sole basis for treatment. Nasal washings and aspirates are unacceptable for Xpert  Xpress SARS-CoV-2/FLU/RSV testing.  Fact Sheet for Patients: EntrepreneurPulse.com.au  Fact Sheet for Healthcare Providers: IncredibleEmployment.be  This test is not yet approved or cleared by the [place] FDA and has been authorized for detection and/or diagnosis of SARS-CoV-2 by FDA under an Emergency Use Authorization (EUA). This EUA will remain in effect (meaning this test can be used) for the duration of the COVID-19 declaration under Section 564(b)(1) of the Act, 21 U.S.C. section 360bbb-3(b)(1), unless the authorization is terminated or revoked.  Performed at [facility], [address], [place], [place] [address]   Blood culture (routine x 2)     Status: Abnormal   Collection Time: 05/15/21  8:53 PM   Specimen: BLOOD  Result Value Ref Range Status   Specimen Description   Final    BLOOD RIGHT ANTECUBITAL Performed at [facility], [address], [place], [place] [address]    Special Requests   Final    BOTTLES DRAWN AEROBIC AND ANAEROBIC Blood Culture adequate volume Performed at [facility], [address] [place], [place] [address]    Culture  Setup Time   Final    GRAM NEGATIVE RODS ANAEROBIC BOTTLE ONLY aerobic bottle GNR Gram Stain Report Called to,Read Back By and Verified With: DAWKINS @ 1020 ON [id number] BY HENDERSON L CRITICAL RESULT CALLED TO, READ BACK BY AND VERIFIED WITH: G,COFFEE PHARMD @1328  05/16/21 EB IN BOTH AEROBIC AND ANAEROBIC BOTTLES Performed at [facility], [place] [address] [place],  [address]    Culture ESCHERICHIA COLI (A)  Final   Report Status 05/18/2021 FINAL  Final   Organism ID, Bacteria ESCHERICHIA COLI  Final      Susceptibility   Escherichia coli - MIC*    AMPICILLIN >=32 RESISTANT Resistant     CEFAZOLIN >=64 RESISTANT Resistant     CEFEPIME <=0.12 SENSITIVE Sensitive  CEFTAZIDIME <=1 SENSITIVE Sensitive     CEFTRIAXONE 1 SENSITIVE Sensitive     CIPROFLOXACIN <=0.25  SENSITIVE Sensitive     GENTAMICIN <=1 SENSITIVE Sensitive     IMIPENEM <=0.25 SENSITIVE Sensitive     TRIMETH/SULFA <=20 SENSITIVE Sensitive     AMPICILLIN/SULBACTAM >=32 RESISTANT Resistant     * ESCHERICHIA COLI  Blood Culture ID Panel (Reflexed)     Status: Abnormal   Collection Time: 05/15/21  8:53 PM  Result Value Ref Range Status   Enterococcus faecalis NOT DETECTED NOT DETECTED Final   Enterococcus Faecium NOT DETECTED NOT DETECTED Final   Listeria monocytogenes NOT DETECTED NOT DETECTED Final   Staphylococcus species NOT DETECTED NOT DETECTED Final   Staphylococcus aureus (BCID) NOT DETECTED NOT DETECTED Final   Staphylococcus epidermidis NOT DETECTED NOT DETECTED Final   Staphylococcus lugdunensis NOT DETECTED NOT DETECTED Final   Streptococcus species NOT DETECTED NOT DETECTED Final   Streptococcus agalactiae NOT DETECTED NOT DETECTED Final   Streptococcus pneumoniae NOT DETECTED NOT DETECTED Final   Streptococcus pyogenes NOT DETECTED NOT DETECTED Final   A.calcoaceticus-baumannii NOT DETECTED NOT DETECTED Final   Bacteroides fragilis NOT DETECTED NOT DETECTED Final   Enterobacterales DETECTED (A) NOT DETECTED Final    Comment: Enterobacterales represent a large order of gram negative bacteria, not a single organism. CRITICAL RESULT CALLED TO, READ BACK BY AND VERIFIED WITH: G,COFFEE PHARMD @1328  05/16/21 EB    Enterobacter cloacae complex NOT DETECTED NOT DETECTED Final   Escherichia coli DETECTED (A) NOT DETECTED Final    Comment: CRITICAL RESULT CALLED TO, READ BACK BY AND VERIFIED WITH: G,COFFEE PHARMD @1328  05/16/21 EB    Klebsiella aerogenes NOT DETECTED NOT DETECTED Final   Klebsiella oxytoca NOT DETECTED NOT DETECTED Final   Klebsiella pneumoniae NOT DETECTED NOT DETECTED Final   Proteus species NOT DETECTED NOT DETECTED Final   Salmonella species NOT DETECTED NOT DETECTED Final   Serratia marcescens NOT DETECTED NOT DETECTED Final   Haemophilus influenzae  NOT DETECTED NOT DETECTED Final   Neisseria meningitidis NOT DETECTED NOT DETECTED Final   Pseudomonas aeruginosa NOT DETECTED NOT DETECTED Final   Stenotrophomonas maltophilia NOT DETECTED NOT DETECTED Final   Candida albicans NOT DETECTED NOT DETECTED Final   Candida auris NOT DETECTED NOT DETECTED Final   Candida glabrata NOT DETECTED NOT DETECTED Final   Candida krusei NOT DETECTED NOT DETECTED Final   Candida parapsilosis NOT DETECTED NOT DETECTED Final   Candida tropicalis NOT DETECTED NOT DETECTED Final   Cryptococcus neoformans/gattii NOT DETECTED NOT DETECTED Final   CTX-M ESBL NOT DETECTED NOT DETECTED Final   Carbapenem resistance IMP NOT DETECTED NOT DETECTED Final   Carbapenem resistance KPC NOT DETECTED NOT DETECTED Final   Carbapenem resistance NDM NOT DETECTED NOT DETECTED Final   Carbapenem resist OXA 48 LIKE NOT DETECTED NOT DETECTED Final   Carbapenem resistance VIM NOT DETECTED NOT DETECTED Final    Comment: Performed at [facility], [address], [place], [place] [address]  Blood culture (routine x 2)     Status: Abnormal   Collection Time: 05/16/21 12:12 AM   Specimen: Right Antecubital; Blood  Result Value Ref Range Status   Specimen Description   Final    RIGHT ANTECUBITAL BOTTLES DRAWN AEROBIC ONLY Performed at [facility], [address], [place], [place] [address]    Special Requests   Final    Blood Culture adequate volume Performed at [facility], [address] [place], [place] [address]  Culture  Setup Time   Final    GRAM NEGATIVE RODS AEROBIC BOTTLE ONLY Gram Stain Report Called to,Read Back By and Verified With: DAWKINS N @ 1410 ON 05/16/21 BY HENDERSON L CRITICAL RESULT CALLED TO, READ BACK BY AND VERIFIED WITH: PHARMD JAMES LEDFORD 05/17/21@3 :28 BY TW Performed at [facility], [place] [address], [place], [place] [address]    Culture ESCHERICHIA COLI (A)  Final   Report Status 05/19/2021 FINAL  Final   Organism ID, Bacteria  ESCHERICHIA COLI  Final      Susceptibility   Escherichia coli - MIC*    AMPICILLIN >=32 RESISTANT Resistant     CEFAZOLIN >=64 RESISTANT Resistant     CEFEPIME <=0.12 SENSITIVE Sensitive     CEFTAZIDIME <=1 SENSITIVE Sensitive     CEFTRIAXONE 0.5 SENSITIVE Sensitive     CIPROFLOXACIN <=0.25 SENSITIVE Sensitive     GENTAMICIN <=1 SENSITIVE Sensitive     IMIPENEM <=0.25 SENSITIVE Sensitive     TRIMETH/SULFA <=20 SENSITIVE Sensitive     AMPICILLIN/SULBACTAM >=32 RESISTANT Resistant     PIP/TAZO 8 SENSITIVE Sensitive     * ESCHERICHIA COLI  Blood Culture ID Panel (Reflexed)     Status: Abnormal   Collection Time: 05/16/21 12:12 AM  Result Value Ref Range Status   Enterococcus faecalis NOT DETECTED NOT DETECTED Final   Enterococcus Faecium NOT DETECTED NOT DETECTED Final   Listeria monocytogenes NOT DETECTED NOT DETECTED Final   Staphylococcus species NOT DETECTED NOT DETECTED Final   Staphylococcus aureus (BCID) NOT DETECTED NOT DETECTED Final   Staphylococcus epidermidis NOT DETECTED NOT DETECTED Final   Staphylococcus lugdunensis NOT DETECTED NOT DETECTED Final   Streptococcus species NOT DETECTED NOT DETECTED Final   Streptococcus agalactiae NOT DETECTED NOT DETECTED Final   Streptococcus pneumoniae NOT DETECTED NOT DETECTED Final   Streptococcus pyogenes NOT DETECTED NOT DETECTED Final   A.calcoaceticus-baumannii NOT DETECTED NOT DETECTED Final   Bacteroides fragilis NOT DETECTED NOT DETECTED Final   Enterobacterales DETECTED (A) NOT DETECTED Final    Comment: Enterobacterales represent a large order of gram negative bacteria, not a single organism. CRITICAL RESULT CALLED TO, READ BACK BY AND VERIFIED WITH: PHARMD JAMES LEDFORD 05/17/21@3 :28 BY TW    Enterobacter cloacae complex NOT DETECTED NOT DETECTED Final   Escherichia coli DETECTED (A) NOT DETECTED Final    Comment: CRITICAL RESULT CALLED TO, READ BACK BY AND VERIFIED WITH: PHARMD JAMES LEDFORD 05/17/21@3 :28 BY TW     Klebsiella aerogenes NOT DETECTED NOT DETECTED Final   Klebsiella oxytoca NOT DETECTED NOT DETECTED Final   Klebsiella pneumoniae NOT DETECTED NOT DETECTED Final   Proteus species NOT DETECTED NOT DETECTED Final   Salmonella species NOT DETECTED NOT DETECTED Final   Serratia marcescens NOT DETECTED NOT DETECTED Final   Haemophilus influenzae NOT DETECTED NOT DETECTED Final   Neisseria meningitidis NOT DETECTED NOT DETECTED Final   Pseudomonas aeruginosa NOT DETECTED NOT DETECTED Final   Stenotrophomonas maltophilia NOT DETECTED NOT DETECTED Final   Candida albicans NOT DETECTED NOT DETECTED Final   Candida auris NOT DETECTED NOT DETECTED Final   Candida glabrata NOT DETECTED NOT DETECTED Final   Candida krusei NOT DETECTED NOT DETECTED Final   Candida parapsilosis NOT DETECTED NOT DETECTED Final   Candida tropicalis NOT DETECTED NOT DETECTED Final   Cryptococcus neoformans/gattii NOT DETECTED NOT DETECTED Final   CTX-M ESBL NOT DETECTED NOT DETECTED Final   Carbapenem resistance IMP NOT DETECTED NOT DETECTED Final   Carbapenem resistance KPC NOT  DETECTED NOT DETECTED Final   Carbapenem resistance NDM NOT DETECTED NOT DETECTED Final   Carbapenem resist OXA 48 LIKE NOT DETECTED NOT DETECTED Final   Carbapenem resistance VIM NOT DETECTED NOT DETECTED Final    Comment: Performed at [facility], [place] [address], [place], [place] [address]  MRSA Next Gen by PCR, Nasal     Status: None   Collection Time: 05/16/21  2:43 AM   Specimen: Nasal Mucosa; Nasal Swab  Result Value Ref Range Status   MRSA by PCR Next Gen NOT DETECTED NOT DETECTED Final    Comment: (NOTE) The GeneXpert MRSA Assay (FDA approved for NASAL specimens only), is one component of a comprehensive MRSA colonization surveillance program. It is not intended to diagnose MRSA infection nor to guide or monitor treatment for MRSA infections. Test performance is not FDA approved in patients less than 64 years old. Performed  at [facility], [place] [address] [place], [place] [address]           Radiology Studies: DG HIP UNILAT WITH PELVIS 2-3 VIEWS LEFT  Result Date: 05/18/2021 CLINICAL DATA:  Chronic left hip pain. EXAM: DG HIP (WITH OR WITHOUT PELVIS) 2-3V LEFT COMPARISON:  None. FINDINGS: There is no evidence of an acute hip fracture or dislocation. Marked severity chronic and degenerative changes are seen in the form of joint space narrowing, acetabular sclerosis and subchondral cyst formation. Lobulated, calcified uterine fibroids are suspected. IMPRESSION: Marked severity chronic and degenerative changes in the left hip. Electronically Signed   By: Virgina Norfolk M.D.   On: 05/18/2021 20:46        Scheduled Meds:  (feeding supplement) PROSource Plus  30 mL Oral BID BM   Chlorhexidine Gluconate Cloth  6 each Topical Q0600   collagenase   Topical Daily   feeding supplement  237 mL Oral TID BM   fentaNYL  1 patch Transdermal Q72H   insulin aspart  0-9 Units Subcutaneous Q4H   megestrol  80 mg Oral Q8H   multivitamin with minerals  1 tablet Oral Daily   pantoprazole  40 mg Oral BID   pravastatin  10 mg Oral q1800   sodium chloride flush  10-40 mL Intracatheter Q12H   Continuous Infusions:  cefTRIAXone (ROCEPHIN)  IV 2 g (05/18/21 2124)     LOS: 4 days    Time spent: 35 minutes.     Elmarie Shiley, MD [facility]   If 7PM-7AM, please contact night-coverage [email or web address]  05/19/2021, 2:59 PM

## 2021-05-19 NOTE — Consult Note (Signed)
Chief Complaint: Patient was seen in consultation today for  Chief Complaint  Patient presents with   Abnormal Lab    Referring Physician(s): Dr. Tyrell Antonio  Supervising Physician: Sandi Mariscal  Patient Status: [facility] - In-pt  History of Present Illness: Crystal Vega is a 70 y.o. female with a medical history significant for HTN, DM, DVT (Eliquis) and metastatic breast cancer. She resides at a nursing facility and was transported to the ED for low hemoglobin levels. She endorsed occasional black stools and vaginal bleeding. Work up in the ED was notable for positive COVID test, hemoglobin of 5, E. Coli bacteremia, hemoccult positive and hypotension with systolic pressures in the 60's. She was admitted to the ICU and started on pressors and IV fluids.   She has a port-a-catheter in place for chemotherapy and the Infectious Disease team has recommended removal due to bacteremia. The port-a-catheter was placed by General Surgery 12/14/19. The patient is familiar to IR from a liver lesion biopsy 12/28/19.   Past Medical History:  Diagnosis Date   Anemia    Arthritis    per patient " left knee"   DVT (deep venous thrombosis) (HCC)    left leg   Dyspnea    Essential hypertension, benign    Family history of cancer of female genital organ    Family history of GI tract cancer    Metastatic breast cancer ([place])    left breast   Port-A-Cath in place 12/25/2019   Type 2 diabetes mellitus [facility])     Past Surgical History:  Procedure Laterality Date   BIOPSY  11/27/2020   Procedure: BIOPSY;  Surgeon: Daneil Dolin, MD;  Location: [facility];  Service: Endoscopy;;   CATARACT EXTRACTION W/ INTRAOCULAR LENS IMPLANT Right    COLONOSCOPY WITH PROPOFOL N/A 11/27/2020   Procedure: COLONOSCOPY WITH PROPOFOL;  Surgeon: Daneil Dolin, MD;  Location: [facility];  Service: Endoscopy;  Laterality: N/A;  9:00am   HYSTEROSCOPY WITH D & C N/A 06/05/2014   Procedure: DILATATION AND CURETTAGE  /HYSTEROSCOPY;  Surgeon: Florian Buff, MD;  Location: [facility] ORS;  Service: Gynecology;  Laterality: N/A;   POLYPECTOMY N/A 06/05/2014   Procedure: ENDOMETRIAL POLYPECTOMY;  Surgeon: Florian Buff, MD;  Location: [facility] OR[facility];  Service: Gynecology;  Laterality: N/A;   PORTACATH PLACEMENT N/A 12/14/2019   Procedure: INSERTION PORT-A-CATH WITH ULTRASOUND GUIDANCE;  Surgeon: Donnie Mesa, MD;  Location: [place];  Service: General;  Laterality: N/A;   TUBAL LIGATION      Allergies: Patient has no known allergies.  Medications: Prior to Admission medications   Medication Sig Start Date End Date Taking? Authorizing Provider  acetaminophen (TYLENOL) 325 MG tablet Take 2 tablets (650 mg total) by mouth every 6 (six) hours as needed for mild pain (or Fever >/= 101). 05/05/21  Yes Roxan Hockey, MD  apixaban (ELIQUIS) 5 MG TABS tablet Take 1 tablet (5 mg total) by mouth 2 (two) times daily. 01/30/21  Yes [name], MD  Ascorbic Acid (VITAMIN C) 100 MG tablet Take 100 mg by mouth in the morning and at bedtime.   Yes [provider]  Ensure Max Protein (ENSURE MAX PROTEIN) LIQD Take 330 mLs (11 oz total) by mouth daily. 05/06/21  Yes Emokpae, Courage, MD  fentaNYL (DURAGESIC) 50 MCG/HR Place 1 patch onto the skin every 3 (three) days. 05/05/21  Yes Emokpae, Courage, MD  ferrous sulfate 325 (65 FE) MG tablet Take 325 mg by mouth daily with breakfast.  Yes [provider]  folic acid (FOLVITE) 1 MG tablet TAKE 1 TABLET BY MOUTH EVERY DAY Patient taking differently: Take 1 mg by mouth daily. 02/09/21  Yes [name], MD  gabapentin (NEURONTIN) 300 MG capsule Take 1 capsule (300 mg total) by mouth 3 (three) times daily. 05/05/21  Yes Emokpae, Courage, MD  HYDROmorphone (DILAUDID) 2 MG tablet Take 1 tablet (2 mg total) by mouth every 4 (four) hours as needed for severe pain. Patient taking differently: Take 2 mg by mouth every 8 (eight) hours as needed for severe pain. 05/05/21  Yes  Emokpae, Courage, MD  KLOR-CON M10 10 MEQ tablet Take 10 mEq by mouth daily. 12/30/20  Yes [provider]  lidocaine (LIDODERM) 5 % Place 1 patch onto the skin daily. Remove & Discard patch within 12 hours or as directed by MD 05/06/21  Yes Roxan Hockey, MD  lidocaine-prilocaine (EMLA) cream Apply 1 application topically as needed. Patient taking differently: Apply 1 application topically daily as needed (pain). 04/02/21  Yes [name], MD  losartan (COZAAR) 50 MG tablet Take 50 mg by mouth daily.   Yes [provider]  metFORMIN (GLUCOPHAGE) 500 MG tablet Take 500 mg by mouth 2 (two) times daily with a meal.   Yes [provider]  metoprolol succinate (TOPROL XL) 25 MG 24 hr tablet Take 1 tablet (25 mg total) by mouth daily. 05/05/21 05/05/22 Yes Emokpae, Courage, MD  nutrition supplement, JUVEN, (JUVEN) PACK Take 1 packet by mouth 2 (two) times daily between meals. 05/06/21  Yes Emokpae, Courage, MD  ondansetron (ZOFRAN) 4 MG tablet Take 1 tablet (4 mg total) by mouth daily as needed for nausea or vomiting. 05/05/21 05/05/22 Yes Emokpae, Courage, MD  polyethylene glycol (MIRALAX / GLYCOLAX) 17 g packet Take 17 g by mouth 2 (two) times daily. 05/05/21  Yes Emokpae, Courage, MD  pravastatin (PRAVACHOL) 10 MG tablet Take 10 mg by mouth at bedtime.   Yes [provider]  senna-docusate (SENOKOT-S) 8.6-50 MG tablet Take 2 tablets by mouth at bedtime. 05/05/21 05/05/22 Yes Emokpae, Courage, MD  Nystatin (GERHARDT'S BUTT CREAM) CREA Apply 1 application topically 3 (three) times daily. Patient not taking: Reported on 05/16/2021 05/05/21   Roxan Hockey, MD     Family History  Problem Relation Age of Onset   Diabetes Mellitus II Father    Congestive Heart Failure Father    Colon cancer Mother 33       patient not sure if colon vs stomach   Stroke Maternal Grandmother    Cancer Cousin        female reproductive cancer, dx. 50s/60s    Social History    Socioeconomic History   Marital status: Married    Spouse name: Herbie [place]   Number of children: 2   Years of education: Not on file   Highest education level: Not on file  Occupational History   Occupation: retired  Tobacco Use   Smoking status: Former    Types: Cigarettes   Smokeless tobacco: Never   Tobacco comments:    quit about 40+ years ago  Vaping Use   Vaping Use: Never used  Substance and Sexual Activity   Alcohol use: Never   Drug use: No   Sexual activity: Not on file  Other Topics Concern   Not on file  Social History Narrative   Not on file   Social Determinants of Health   Financial Resource Strain: Not on file  Food Insecurity: Not on file  Transportation Needs: Not on file  Physical Activity: Not on file  Stress: Not on file  Social Connections: Not on file    Review of Systems: A 12 point ROS discussed and pertinent positives are indicated in the HPI above.  All other systems are negative.  Review of Systems  Constitutional:  Positive for appetite change and fatigue.  Respiratory:  Negative for cough and shortness of breath.   Cardiovascular:  Negative for chest pain and leg swelling.  Gastrointestinal:  Positive for nausea. Negative for diarrhea and vomiting.  Neurological:  Negative for headaches.   Vital Signs: BP 134/85 (BP Location: Right Leg)    Pulse (!) 103    Temp 98.2 F (36.8 C) (Oral)    Resp 17    Ht 5\' 3"  (1.6 m)    Wt 266 lb 5.1 oz (120.8 kg)    SpO2 97%    BMI 47.18 kg/m   Physical Exam Constitutional:      General: She is not in acute distress. HENT:     Mouth/Throat:     Mouth: Mucous membranes are moist.     Pharynx: Oropharynx is clear.  Cardiovascular:     Comments: Right IJ  port-a-cathter; accessed. Site unremarkable.  Pulmonary:     Effort: Pulmonary effort is normal.  Neurological:     Mental Status: She is alert and oriented to person, place, and time.    Imaging: DG Chest 1 View  Result Date:  04/27/2021 CLINICAL DATA:  [place] 2 weeks ago EXAM: CHEST  1 VIEW COMPARISON:  07/28/2020 FINDINGS: Power port on the right with the tip in the right atrium, possibly approaching the tricuspid valve. No evidence of heart failure. Patient has taken a poor inspiration. No traumatic regional finding. IMPRESSION: Poor inspiration. No traumatic finding. Power port tip in the right atrium, possibly near the tricuspid valve. Electronically Signed   By: Nelson Chimes M.D.   On: 04/27/2021 13:18   DG Lumbar Spine 2-3 Views  Result Date: 04/29/2021 CLINICAL DATA:  Low back and left hip pain EXAM: LUMBAR SPINE - 2-3 VIEW COMPARISON:  CT 04/27/2021 FINDINGS: Limited resolution. Five lumbar type vertebral bodies do not show any visible malalignment. There is degenerative disc disease and degenerative facet disease throughout the lumbar region. Bilateral sacroiliac degenerated changes noted. Advanced chronic arthropathy of the hips, left worse than right. IMPRESSION: Limited imaging. 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 Bilateral sacroiliac arthritis. Electronically Signed   By: Nelson Chimes M.D.   On: 04/29/2021 09:52   DG Knee 1-2 Views Left  Result Date: 04/27/2021 CLINICAL DATA:  [place] 2 weeks ago.  Pain and limited range of motion. EXAM: LEFT KNEE - 1-2 VIEW COMPARISON:  None. FINDINGS: There is tricompartmental osteoarthritis with a joint effusion. No evidence of fracture or focal bone lesion. IMPRESSION: Osteoarthritis and joint effusion. No traumatic bone finding visible. Electronically Signed   By: Nelson Chimes M.D.   On: 04/27/2021 13:16   CT Angio Chest PE W and/or Wo Contrast  Result Date: 04/27/2021 CLINICAL DATA:  PE suspected, high probability. LEFT leg pain and weakness status post fall 2 weeks ago. History of metastatic LEFT breast cancer, per clinical data provided on previous CT report. EXAM: CT ANGIOGRAPHY CHEST CT ABDOMEN AND PELVIS WITH CONTRAST  TECHNIQUE: Multidetector CT imaging of the chest was performed using the standard protocol during bolus administration of intravenous contrast. Multiplanar CT image reconstructions and MIPs were obtained to evaluate the vascular  anatomy. Multidetector CT imaging of the abdomen and pelvis was performed using the standard protocol during bolus administration of intravenous contrast. CONTRAST:  139mL OMNIPAQUE IOHEXOL 350 MG/ML SOLN COMPARISON:  CT chest abdomen and pelvis dated 04/09/2021. FINDINGS: CTA CHEST FINDINGS Cardiovascular: Some of the most peripheral subsegmental pulmonary arteries are difficult to definitively characterize due to patient breathing motion artifact, however, there is no pulmonary embolism seen within the main, lobar or central segmental pulmonary arteries bilaterally. No thoracic aortic aneurysm or evidence of aortic dissection. No pericardial effusion. Mediastinum/Nodes: Mediastinal and perihilar lymphadenopathy, including a 1.2 cm short axis lymph node within the anterior mediastinum and a 1.6 cm short axis lymph node within the RIGHT lower paratracheal mediastinum, not significantly changed compared to the recent chest CT of 04/09/2021, compatible with metastatic lymphadenopathy. Additional conglomerate lymphadenopathy is also again seen within the LEFT supraclavicular region, incompletely imaged. z Esophagus is unremarkable. Trachea and central bronchi are unremarkable. Lungs/Pleura: Lungs are clear.  No pleural effusion or pneumothorax. Musculoskeletal: No acute appearing osseous abnormality. Lytic-appearing lesion within the T6 vertebral body is suspicious for osseous metastasis. Additional destructive/lytic changes within the sternum, also suggesting metastatic disease. Conglomerate lymphadenopathy within the LEFT axilla, and mass/lymphadenopathy in the axillary tail region of the LEFT breast measures 4 cm greatest dimension, all of which is not significantly changed compared to the  recent chest CT of 04/09/2021. Additional milder lymphadenopathy within the RIGHT axilla is redemonstrated and stable. Review of the MIP images confirms the above findings. CT ABDOMEN and PELVIS FINDINGS Hepatobiliary: Numerous small hypodense lesions within the bilateral liver lobes, as also described on the earlier CT abdomen report of 12/12/2019, presumably numerous small liver metastases. Single stone within the otherwise normal-appearing gallbladder. No bile duct dilatation is seen. Pancreas: Unremarkable. No pancreatic ductal dilatation or surrounding inflammatory changes. Spleen: Normal in size without focal abnormality. Adrenals/Urinary Tract: Adrenals are unremarkable. Bilateral renal cysts. Kidneys are otherwise unremarkable without suspicious mass, stone or hydronephrosis. No ureteral or bladder calculi are identified. Bladder is unremarkable. Stomach/Bowel: No dilated large or small bowel loops. Scattered diverticulosis of the descending and sigmoid colon but no focal inflammatory change to suggest acute diverticulitis. No evidence of acute bowel wall inflammation. Stomach is unremarkable. Appendix is normal. Vascular/Lymphatic: Vascular structures of the abdomen and pelvis are unremarkable. Conglomerate lymphadenopathy above the pancreatic head and adjacent to the porta hepatis, as previously described, presumed lymph node metastases. Reproductive: Calcified uterine fibroids. No adnexal mass or free fluid. Other: No free fluid or abscess collection. No free intraperitoneal air. Musculoskeletal: No acute findings. Degenerative spondylosis of the lumbar spine, mild to moderate in degree. 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 Review of the MIP images confirms the above findings. IMPRESSION: 1. No acute findings within the chest. No pulmonary embolism is seen, with mild study limitations detailed above. No evidence of pneumonia or pulmonary edema. 2. Metastatic lymphadenopathy is redemonstrated within the  mediastinum, bilateral axillae, and supraclavicular LEFT neck. Also redemonstrated is the mass versus conglomerate lymphadenopathy in the axillary tail region of the LEFT breast. These findings are stable compared to the recent chest CT of 04/09/2021. 3. Lytic-appearing lesion within the T6 vertebral body, and destructive/lytic changes within the sternum, suspicious for metastatic osseous disease. Consider nuclear medicine bone scan or PET scan for confirmation. 4. Numerous small hypodense lesions within the bilateral liver lobes, as also described on the earlier CT abdomen report of 12/12/2019, presumably liver metastases. 5. Cholelithiasis without evidence of acute cholecystitis. 6. Colonic diverticulosis without evidence of  acute diverticulitis. 7. No acute findings within the abdomen or pelvis. No bowel obstruction or evidence of acute bowel wall inflammation. No free fluid or abscess collection. No evidence of acute solid organ abnormality. 8. No evidence of acute osseous fracture or dislocation is seen. Electronically Signed   By: Franki Cabot M.D.   On: 04/27/2021 17:09   CT Knee Left Wo Contrast  Result Date: 04/27/2021 CLINICAL DATA:  Fall 2 weeks ago comment limited range of motion EXAM: CT OF THE LEFT KNEE WITHOUT CONTRAST TECHNIQUE: Multidetector CT imaging of the left knee was performed according to the standard protocol. Multiplanar CT image reconstructions were also generated. COMPARISON:  Radiographs 04/27/2021 FINDINGS: Bones/Joint/Cartilage The knee was imaged in a moderately flexed orientation. There is marked proliferative spurring all 3 compartments with associated cortical irregularity which can reduce sensitivity for subtle fractures. I do not see a well-defined cortical discontinuity to indicate fracture. There is a moderate knee effusion in the suprapatellar bursa. Ligaments Suboptimally assessed by CT. Muscles and Tendons Mild regional muscular atrophy diffusely. Soft tissues  Atherosclerosis. Subcutaneous edema anterior to the patella, cannot exclude prepatellar bruising. IMPRESSION: 1. 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 No fracture is identified, although sensitivity for subtle fractures is reduced due to the degree of spurring and associated cortical irregularities. 2. Moderate knee effusion. 3. Atherosclerosis. 4. Subcutaneous edema anterior to the patella. Electronically Signed   By: Van Clines M.D.   On: 04/27/2021 17:15   CT ABDOMEN PELVIS W CONTRAST  Result Date: 04/27/2021 CLINICAL DATA:  PE suspected, high probability. LEFT leg pain and weakness status post fall 2 weeks ago. History of metastatic LEFT breast cancer, per clinical data provided on previous CT report. EXAM: CT ANGIOGRAPHY CHEST CT ABDOMEN AND PELVIS WITH CONTRAST TECHNIQUE: Multidetector CT imaging of the chest was performed using the standard protocol during bolus administration of intravenous contrast. Multiplanar CT image reconstructions and MIPs were obtained to evaluate the vascular anatomy. Multidetector CT imaging of the abdomen and pelvis was performed using the standard protocol during bolus administration of intravenous contrast. CONTRAST:  183mL OMNIPAQUE IOHEXOL 350 MG/ML SOLN COMPARISON:  CT chest abdomen and pelvis dated 04/09/2021. FINDINGS: CTA CHEST FINDINGS Cardiovascular: Some of the most peripheral subsegmental pulmonary arteries are difficult to definitively characterize due to patient breathing motion artifact, however, there is no pulmonary embolism seen within the main, lobar or central segmental pulmonary arteries bilaterally. No thoracic aortic aneurysm or evidence of aortic dissection. No pericardial effusion. Mediastinum/Nodes: Mediastinal and perihilar lymphadenopathy, including a 1.2 cm short axis lymph node within the anterior mediastinum and a 1.6 cm short axis lymph node within the RIGHT lower paratracheal mediastinum, not significantly  changed compared to the recent chest CT of 04/09/2021, compatible with metastatic lymphadenopathy. Additional conglomerate lymphadenopathy is also again seen within the LEFT supraclavicular region, incompletely imaged. z Esophagus is unremarkable. Trachea and central bronchi are unremarkable. Lungs/Pleura: Lungs are clear.  No pleural effusion or pneumothorax. Musculoskeletal: No acute appearing osseous abnormality. Lytic-appearing lesion within the T6 vertebral body is suspicious for osseous metastasis. Additional destructive/lytic changes within the sternum, also suggesting metastatic disease. Conglomerate lymphadenopathy within the LEFT axilla, and mass/lymphadenopathy in the axillary tail region of the LEFT breast measures 4 cm greatest dimension, all of which is not significantly changed compared to the recent chest CT of 04/09/2021. Additional milder lymphadenopathy within the RIGHT axilla is redemonstrated and stable. Review of the MIP images confirms the above findings. CT ABDOMEN and PELVIS FINDINGS Hepatobiliary: Numerous  small hypodense lesions within the bilateral liver lobes, as also described on the earlier CT abdomen report of 12/12/2019, presumably numerous small liver metastases. Single stone within the otherwise normal-appearing gallbladder. No bile duct dilatation is seen. Pancreas: Unremarkable. No pancreatic ductal dilatation or surrounding inflammatory changes. Spleen: Normal in size without focal abnormality. Adrenals/Urinary Tract: Adrenals are unremarkable. Bilateral renal cysts. Kidneys are otherwise unremarkable without suspicious mass, stone or hydronephrosis. No ureteral or bladder calculi are identified. Bladder is unremarkable. Stomach/Bowel: No dilated large or small bowel loops. Scattered diverticulosis of the descending and sigmoid colon but no focal inflammatory change to suggest acute diverticulitis. No evidence of acute bowel wall inflammation. Stomach is unremarkable. Appendix  is normal. Vascular/Lymphatic: Vascular structures of the abdomen and pelvis are unremarkable. Conglomerate lymphadenopathy above the pancreatic head and adjacent to the porta hepatis, as previously described, presumed lymph node metastases. Reproductive: Calcified uterine fibroids. No adnexal mass or free fluid. Other: No free fluid or abscess collection. No free intraperitoneal air. Musculoskeletal: No acute findings. Degenerative spondylosis of the lumbar spine, mild to moderate in degree. 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 Review of the MIP images confirms the above findings. IMPRESSION: 1. No acute findings within the chest. No pulmonary embolism is seen, with mild study limitations detailed above. No evidence of pneumonia or pulmonary edema. 2. Metastatic lymphadenopathy is redemonstrated within the mediastinum, bilateral axillae, and supraclavicular LEFT neck. Also redemonstrated is the mass versus conglomerate lymphadenopathy in the axillary tail region of the LEFT breast. These findings are stable compared to the recent chest CT of 04/09/2021. 3. Lytic-appearing lesion within the T6 vertebral body, and destructive/lytic changes within the sternum, suspicious for metastatic osseous disease. Consider nuclear medicine bone scan or PET scan for confirmation. 4. Numerous small hypodense lesions within the bilateral liver lobes, as also described on the earlier CT abdomen report of 12/12/2019, presumably liver metastases. 5. Cholelithiasis without evidence of acute cholecystitis. 6. Colonic diverticulosis without evidence of acute diverticulitis. 7. No acute findings within the abdomen or pelvis. No bowel obstruction or evidence of acute bowel wall inflammation. No free fluid or abscess collection. No evidence of acute solid organ abnormality. 8. No evidence of acute osseous fracture or dislocation is seen. Electronically Signed   By: Franki Cabot M.D.   On: 04/27/2021 17:09   CT Hip Left Wo Contrast  Result  Date: 04/27/2021 CLINICAL DATA:  Hip trauma, fracture suspected. EXAM: CT OF THE LEFT HIP WITHOUT CONTRAST TECHNIQUE: Multidetector CT imaging of the left hip was performed according to the standard protocol. Multiplanar CT image reconstructions were also generated. COMPARISON:  None. FINDINGS: No evidence of acute fracture or dislocation at the LEFT hip. 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 Deformity of the LEFT humeral head/neck is compatible with previous injury versus chronic deformity related to the overlying degenerative joint disease. Visualized osseous structures of the LEFT hemipelvis appear intact and normally aligned. Additional degenerative change noted at the LEFT SI joint and within the lower lumbar spine. Visualized soft tissues about the LEFT hip are unremarkable. IMPRESSION: 1. No evidence of acute fracture or dislocation at the LEFT hip. 2. 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 Electronically Signed   By: Franki Cabot M.D.   On: 04/27/2021 17:13   DG Chest Port 1 View  Result Date: 05/16/2021 CLINICAL DATA:  Short of breath.  COVID-19. EXAM: PORTABLE CHEST 1 VIEW COMPARISON:  04/29/2021 and older exams. FINDINGS: Cardiac silhouette normal in size.  No mediastinal or hilar masses. Stable elevation of the right hemidiaphragm. Lungs are clear. No convincing pleural effusion and no pneumothorax. Right internal jugular Port-A-Cath is also stable, tip projecting in the right atrium. IMPRESSION: 1. No acute cardiopulmonary disease. Electronically Signed   By: Lajean Manes M.D.   On: 05/16/2021 09:11   DG CHEST PORT 1 VIEW  Result Date: 04/29/2021 CLINICAL DATA:  Leukocytosis, atrial tachycardia, history of breast cancer EXAM: PORTABLE CHEST 1 VIEW COMPARISON:  04/27/2021 FINDINGS: Right chest wall port catheter is  unchanged. Persistent elevation of the right hemidiaphragm. No new consolidation or edema. No pleural effusion. Stable cardiomediastinal contours. IMPRESSION: No acute process in the chest. Electronically Signed   By: Macy Mis M.D.   On: 04/29/2021 08:05   ECHOCARDIOGRAM COMPLETE  Result Date: 04/30/2021    ECHOCARDIOGRAM REPORT   Patient Name:   Crystal Vega Date of Exam: 04/30/2021 Medical Rec #:  [id number]       Height:       63.0 in Accession #:    [id number]      Weight:       257.1 lb Date of Birth:  [birth year]       BSA:          2.151 m Patient Age:    36 years        BP:           112/70 mmHg Patient Gender: F               HR:           84 bpm. Exam Location:  [facility] Procedure: 2D Echo, Cardiac Doppler and Color Doppler Indications:    Bacteremia  History:        Patient has prior history of Echocardiogram examinations, most                 recent 09/20/1941. Signs/Symptoms:Chest Pain; Risk                 Factors:Hypertension and Diabetes. Port-a-cath access only, no                 IV, Breast CA,.  Sonographer:    Wenda Low Referring Phys: [id number] COURAGE EMOKPAE  Sonographer Comments: Patient is morbidly obese and Technically difficult study due to poor echo windows. IMPRESSIONS  1. Left ventricular ejection fraction, by estimation, is 60 to 65%. The left ventricle has normal function. Left ventricular endocardial border not optimally defined to evaluate regional wall motion. There is moderate left ventricular hypertrophy. Left ventricular diastolic parameters are indeterminate.  2. Right ventricular systolic function is normal. The right ventricular size is mildly enlarged. There is normal pulmonary artery systolic pressure. The estimated right ventricular systolic pressure is 27.2 mmHg.  3. The mitral valve is grossly normal. Trivial mitral valve regurgitation.  4. The aortic valve is tricuspid. Aortic valve regurgitation is not visualized. Aortic valve sclerosis is present, with  no evidence of aortic valve stenosis. Aortic valve mean gradient measures 3.0 mmHg.  5. The inferior vena cava is normal in size with greater than 50% respiratory variability, suggesting right atrial pressure of 3 mmHg. Comparison(s): No significant change from prior study. Prior images reviewed side by side. FINDINGS  Left Ventricle: Left ventricular ejection fraction, by estimation, is 60 to 65%. The left ventricle has normal function. Left ventricular endocardial border  not optimally defined to evaluate regional wall motion. The left ventricular internal cavity size was normal in size. There is moderate left ventricular hypertrophy. Left ventricular diastolic parameters are indeterminate. Right Ventricle: The right ventricular size is mildly enlarged. No increase in right ventricular wall thickness. Right ventricular systolic function is normal. There is normal pulmonary artery systolic pressure. The tricuspid regurgitant velocity is 2.64  m/s, and with an assumed right atrial pressure of 3 mmHg, the estimated right ventricular systolic pressure is 25.9 mmHg. Left Atrium: Left atrial size was normal in size. Right Atrium: Right atrial size was normal in size. Pericardium: There is no evidence of pericardial effusion. Mitral Valve: The mitral valve is grossly normal. Trivial mitral valve regurgitation. MV peak gradient, 3.7 mmHg. The mean mitral valve gradient is 2.0 mmHg. Tricuspid Valve: The tricuspid valve is grossly normal. Tricuspid valve regurgitation is mild. Aortic Valve: The aortic valve is tricuspid. There is mild aortic valve annular calcification. Aortic valve regurgitation is not visualized. Aortic valve sclerosis is present, with no evidence of aortic valve stenosis. Aortic valve mean gradient measures  3.0 mmHg. Aortic valve peak gradient measures 4.9 mmHg. Aortic valve area, by VTI measures 2.32 cm. Pulmonic Valve: The pulmonic valve was grossly normal. Pulmonic valve regurgitation is trivial.  Aorta: The aortic root is normal in size and structure. Venous: The inferior vena cava is normal in size with greater than 50% respiratory variability, suggesting right atrial pressure of 3 mmHg. IAS/Shunts: No atrial level shunt detected by color flow Doppler.  LEFT VENTRICLE PLAX 2D LVIDd:         3.70 cm LVIDs:         2.50 cm LV PW:         1.40 cm LV IVS:        1.30 cm LVOT diam:     2.00 cm LV SV:         61 LV SV Index:   28 LVOT Area:     3.14 cm  RIGHT VENTRICLE RV Basal diam:  3.45 cm RV Mid diam:    3.70 cm RV S prime:     11.70 cm/s LEFT ATRIUM           Index        RIGHT ATRIUM           Index LA diam:      3.00 cm 1.39 cm/m   RA Area:     12.60 cm LA Vol (A4C): 26.8 ml 12.46 ml/m  RA Volume:   31.80 ml  14.78 ml/m  AORTIC VALVE                    PULMONIC VALVE AV Area (Vmax):    2.33 cm     PV Vmax:       0.86 m/s AV Area (Vmean):   2.06 cm     PV Peak grad:  3.0 mmHg AV Area (VTI):     2.32 cm AV Vmax:           111.00 cm/s AV Vmean:          81.400 cm/s AV VTI:            0.263 m AV Peak Grad:      4.9 mmHg AV Mean Grad:      3.0 mmHg LVOT Vmax:         82.20 cm/s LVOT Vmean:        53.400 cm/s LVOT VTI:  0.194 m LVOT/AV VTI ratio: 0.74  AORTA Ao Root diam: 3.10 cm Ao Asc diam:  3.10 cm MITRAL VALVE               TRICUSPID VALVE MV Area (PHT): 5.93 cm    TR Peak grad:   27.9 mmHg MV Area VTI:   2.83 cm    TR Vmax:        264.00 cm/s MV Peak grad:  3.7 mmHg MV Mean grad:  2.0 mmHg    SHUNTS MV Vmax:       0.96 m/s    Systemic VTI:  0.19 m MV Vmean:      67.4 cm/s   Systemic Diam: 2.00 cm MV Decel Time: 128 msec MV E velocity: 94.30 cm/s MV A velocity: 91.30 cm/s MV E/A ratio:  1.03 Rozann Lesches MD Electronically signed by Rozann Lesches MD Signature Date/Time: 04/30/2021/10:53:15 AM    Final    DG HIP UNILAT WITH PELVIS 2-3 VIEWS LEFT  Result Date: 05/18/2021 CLINICAL DATA:  Chronic left hip pain. EXAM: DG HIP (WITH OR WITHOUT PELVIS) 2-3V LEFT COMPARISON:  None. FINDINGS:  There is no evidence of an acute hip fracture or dislocation. Marked severity chronic and degenerative changes are seen in the form of joint space narrowing, acetabular sclerosis and subchondral cyst formation. Lobulated, calcified uterine fibroids are suspected. IMPRESSION: Marked severity chronic and degenerative changes in the left hip. Electronically Signed   By: Virgina Norfolk M.D.   On: 05/18/2021 20:46   DG Hip Unilat W or Wo Pelvis 2-3 Views Left  Result Date: 04/27/2021 CLINICAL DATA:  [place] 2 weeks ago.  Pain. EXAM: DG HIP (WITH OR WITHOUT PELVIS) 2-3V LEFT COMPARISON:  None. FINDINGS: There is advanced osteoarthritis of the left hip with joint space narrowing, sclerosis, osteophyte and flattening of femoral head. No definite acute regional fracture. 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 IMPRESSION: Advanced osteoarthritis of the left hip. No visible traumatic finding. Flattening of the humeral head which is probably chronic. Electronically Signed   By: Nelson Chimes M.D.   On: 04/27/2021 13:17    Labs:  CBC: Recent Labs    05/17/21 1351 05/18/21 0159 05/18/21 0745 05/19/21 0503  WBC 7.3 8.1 6.5 6.9  HGB 7.8* 7.5* 7.4* 8.8*  HCT 23.9* 23.2* 23.4* 27.3*  PLT 99* 102* 96* 121*    COAGS: Recent Labs    05/15/21 1658 05/16/21 0013 05/17/21 1420  INR 2.0* 1.9* 1.3*  APTT  --  60*  --     BMP: Recent Labs    05/16/21 1948 05/17/21 0441 05/18/21 0159 05/19/21 0503  NA 133* 135 133* 135  K 4.0 3.9 4.1 3.6  CL 101 102 102 104  CO2 24 27 22 26   GLUCOSE 129* 98 92 109*  BUN 33* 23 16 11   CALCIUM 7.9* 8.0* 7.7* 7.4*  CREATININE 0.77 0.67 0.64 0.62  GFRNONAA >60 >60 >60 >60    LIVER FUNCTION TESTS: Recent Labs    05/01/21 0500 05/06/21 0544 05/15/21 1659 05/16/21 1948  BILITOT 0.4 0.3 0.1* 0.8  AST 30 25 28 29   ALT 13 16 12 14   ALKPHOS 93 116 119 117  PROT 5.9* 5.9* 5.9* 5.9*  ALBUMIN 2.0* 2.0* 1.8* 1.7*    TUMOR MARKERS: No results  for input(s): AFPTM, CEA, CA199, CHROMGRNA in the last 8760 hours.  Assessment and Plan:  Bacteremia; port-a-catheter in place: Riki Sheer Viviani, 70 year old female, is tentatively scheduled tomorrow 05/20/21 for  port-a-catheter removal. She is also scheduled tomorrow at 0830 for a [facility] with gynecology.  Risks and benefits of port-a-catheter removal were discussed with the patient including, but not limited to bleeding and infection.  All of the patient's questions were answered, patient is agreeable to proceed.  Consent signed and in IR.  Thank you for this interesting consult.  I greatly enjoyed meeting SHAKEITHA UMBAUGH and look forward to participating in their care.  A copy of this report was sent to the requesting provider on this date.  Electronically Signed: Soyla Dryer, AGACNP-BC [phone] 05/19/2021, 9:12 AM   I spent a total of 20 Minutes    in face to face in clinical consultation, greater than 50% of which was counseling/coordinating care for port-a-catheter removal.

## 2021-05-19 NOTE — Progress Notes (Signed)
Notified wound care nurse Julien Girt about 2 sacral pressure injuries on patient. One sacral injury documented prior to admission to floor. Wound care aware and states to continue foam dressings.

## 2021-05-19 NOTE — Progress Notes (Signed)
Physical Therapy Treatment Patient Details Name: Crystal Vega MRN: [id number] DOB: Feb 23, 1951 Today's Date: 05/19/2021   History of Present Illness Patient is a 70 yo female admitted to [facility] ED on 12/16 with ABLA.  transfer to [facility] on 12/17 due to hypotension with incr medical issues.  Also positive for COVID.   Pt was at [facility] since early December after a falll.  Patient reports more fatigued and some vaginal bleeding for about 2 weeks. Admitted with hemorrhagic and septic shock, E.Coli bacteremia. Plan for [facility] of the uterus 12/21. Plan for [facility] removal 12/21. PMH of breast cancer undergoing chemo, colonic adenocarcinoma, iron deficiency anemia, DVT on Eliquis    PT Comments    Patient not progressing with mobility due to pain in left hip/LE with any movement. Tolerated exercise with RLE and trunk. Pt not allowing therapist to touch LLE, swelling noted in knee joint. Worked on repositioning- floating heels on pillows, adjusting trunk, there ex RLE and HEP. Will provide theraband next session and instruct pt in UE exercises for strengthening. Recommend maxi move lift for OOB with nursing as tolerated. Will follow.   Recommendations for follow up therapy are one component of a multi-disciplinary discharge planning process, led by the attending physician.  Recommendations may be updated based on patient status, additional functional criteria and insurance authorization.  Follow Up Recommendations  Skilled nursing-short term rehab (<3 hours/day)     Assistance Recommended at Discharge Frequent or constant Supervision/Assistance  Equipment Recommendations  None recommended by PT    Recommendations for Other Services       Precautions / Restrictions Precautions Precautions: Fall Restrictions Weight Bearing Restrictions: No     Mobility  Bed Mobility               General bed mobility comments: Worked on trying to pull trunk away from bed using UEs, need to  manually place left hand on bed rail.    Transfers                   General transfer comment: unable at present    Ambulation/Gait                   Stairs             Wheelchair Mobility    Modified Rankin (Stroke Patients Only)       Balance                                            Cognition Arousal/Alertness: Awake/alert Behavior During Therapy: WFL for tasks assessed/performed Overall Cognitive Status: Within Functional Limits for tasks assessed                                          Exercises General Exercises - Lower Extremity Ankle Circles/Pumps: AROM;Both;Supine;20 reps Quad Sets: AROM;Right;10 reps;Supine Gluteal Sets: AROM;Both;10 reps;Supine Heel Slides: AROM;Right;Supine (x2)    General Comments General comments (skin integrity, edema, etc.): HR ranging from 115-136 bpm in bed, worked on repositioning- floating heels on pillows, adjusting trunk, there ex RLE.      Pertinent Vitals/Pain Pain Assessment: Faces Faces Pain Scale: Hurts worst Pain Location: left knee and hip with any movement Pain Descriptors / Indicators: Grimacing;Guarding;Sore;Moaning;Constant Pain Intervention(s): Monitored during  session;Limited activity within patient's tolerance;Repositioned    Home Living                          Prior Function            PT Goals (current goals can now be found in the care plan section) Progress towards PT goals: Not progressing toward goals - comment (due to pain)    Frequency    Min 2X/week      PT Plan Current plan remains appropriate    Co-evaluation              AM-PAC PT "6 Clicks" Mobility   Outcome Measure  Help needed turning from your back to your side while in a flat bed without using bedrails?: Total Help needed moving from lying on your back to sitting on the side of a flat bed without using bedrails?: Total Help needed moving to and  from a bed to a chair (including a wheelchair)?: Total Help needed standing up from a chair using your arms (e.g., wheelchair or bedside chair)?: Total Help needed to walk in hospital room?: Total Help needed climbing 3-5 steps with a railing? : Total 6 Click Score: 6    End of Session   Activity Tolerance: Patient limited by pain Patient left: in bed;with call bell/phone within reach;with bed alarm set Nurse Communication: Need for lift equipment;Mobility status PT Visit Diagnosis: Unsteadiness on feet (R26.81);Other abnormalities of gait and mobility (R26.89);Muscle weakness (generalized) (M62.81);Pain Pain - Right/Left: Left Pain - part of body: Leg     Time: 1420-1449 PT Time Calculation (min) (ACUTE ONLY): 29 min  Charges:  $Therapeutic Exercise: 23-37 mins                     [name], PT, DPT Acute Rehabilitation Services Pager [phone] Office [phone]      Marguarite Arbour A Sabra Heck 05/19/2021, 3:26 PM

## 2021-05-19 NOTE — Progress Notes (Addendum)
[facility]  Date of Admission:  05/15/2021     Lines:  Chronic port-a-cath   Abx: 12/18-c ceftriaxone   12/17 cefepime                                                            12/07-17 amox 11/29-12/07 vanc-->ceftriaxone   Assessment: Ecoli bacteremia (R amp/sulb and cefazolin) Septic shock Incidental covid positive test Recent (11/29) high burden strep mitis/oralis bacteremia no TEE  Metastatic left cancer (left breast -- on outpatient chemo) Hx of right colon adenocx Hx dvt   70 yo female colon cancer/breast cancer on chemo (last cycle ?end of October -- on hold due to infection) here with vaginal bleeding/blood loss anemia and septic shock due to ecoli bacteremia   Agree source of the ecoli bacteremia is difficult (?gi from colon cancer vs port; no urinary sx and no urine cx obtained). Outside of culturing the port tip, would be impossible to determine source now   Also of concern is her recent strep mitis/oralis high burden bacteremia from 11/29. No culture repeat was done. The tte was negative. The source could have been the port/gut vs endocarditis.    At this juncture, would be prudent to remove the port given severe presentation/gram negative sepsis, and previous bacteremia of unclear source. Would also check TEE as she is at risk for endocarditis (marantic changes and new bacteremia of unclear source)   She had chronic dvt previously but less likely for that to be source of either of these bacteremic episodes    ------ 12/20 assessment Discussed with cardiology -- tee planned for Thursday this week Patient await d&C of uterus tomorrow Tolerating ceftriaxone  She does have significant left hip pain and exam is limited due to anxiety. MRI left hip would be helpful to r/o bacteremic septic joint complication    -continue ceftriaxone -please coordinate with oncology/IR vs surgery to remove the port -await tee tentatively  discussed for Thursday afternoon -if tee doesn't show evidence of endocarditis, could finish abx treatment with oral option -will order mri left hip -discussed with primary team    Principal Problem:   Circulatory failure ([place]) Active Problems:   E coli bacteremia   Hypovolemic shock ([place])   COVID-19   Symptomatic anemia   Vaginal bleeding   AKI (acute kidney injury) ([place])   Hyperglycemia due to diabetes mellitus ([place])   Gastrointestinal hemorrhage with melena   Severe sepsis with septic shock ([facility])   Postmenopausal vaginal bleeding   No Known Allergies  Scheduled Meds:  (feeding supplement) PROSource Plus  30 mL Oral BID BM   Chlorhexidine Gluconate Cloth  6 each Topical Q0600   collagenase   Topical Daily   feeding supplement  237 mL Oral TID BM   fentaNYL  1 patch Transdermal Q72H   insulin aspart  0-9 Units Subcutaneous Q4H   megestrol  80 mg Oral Q8H   multivitamin with minerals  1 tablet Oral Daily   pantoprazole  40 mg Oral BID   pravastatin  10 mg Oral q1800   sodium chloride flush  10-40 mL Intracatheter Q12H   Continuous Infusions:  cefTRIAXone (ROCEPHIN)  IV 2 g (05/18/21 2124)   PRN Meds:.busPIRone, docusate sodium, lip balm, oxyCODONE,  polyethylene glycol, sodium chloride flush   SUBJECTIVE: Reviewed chart Patient very anxious Complains of stable but significant left lateral/posterior hip pain No fever/chill  Unclear if bleeding more/less in vaginal area  Review of Systems: ROS All other ROS was negative, except mentioned above     OBJECTIVE: Vitals:   05/18/21 2100 05/19/21 0416 05/19/21 0829 05/19/21 1218  BP: 133/78 (!) 151/81 134/85 135/80  Pulse: (!) 101 96 (!) 103 (!) 108  Resp: 17 16 17 18   Temp: 98.2 F (36.8 C) 98.7 F (37.1 C) 98.2 F (36.8 C) 97.9 F (36.6 C)  TempSrc: Oral Oral Oral Oral  SpO2: 100% 98% 97% 98%  Weight:      Height:       Body mass index is 47.18 kg/m.  Physical Exam  General/constitutional: no  distress, pleasant HEENT: Normocephalic, PER, Conj Clear, EOMI, Oropharynx clear Neck supple CV: rrr no mrg Lungs: clear to auscultation, normal respiratory effort Abd: Soft, Nontender Ext: no edema Skin: No Rash Neuro: nonfocal MSK: tender left hip lateroposteriorly on palpation, doesn't allow me to passively do ROM hip but some active rom noted without issue   Central line -- right chest port site no erythema/purulence  Lab Results Lab Results  Component Value Date   WBC 6.9 05/19/2021   HGB 8.8 (L) 05/19/2021   HCT 27.3 (L) 05/19/2021   MCV 88.3 05/19/2021   PLT 121 (L) 05/19/2021    Lab Results  Component Value Date   CREATININE 0.62 05/19/2021   BUN 11 05/19/2021   NA 135 05/19/2021   K 3.6 05/19/2021   CL 104 05/19/2021   CO2 26 05/19/2021    Lab Results  Component Value Date   ALT 14 05/16/2021   AST 29 05/16/2021   ALKPHOS 117 05/16/2021   BILITOT 0.8 05/16/2021      Microbiology: Recent Results (from the past 240 hour(s))  Resp Panel by RT-PCR (Flu A&B, Covid) Nasopharyngeal Swab     Status: Abnormal   Collection Time: 05/15/21  6:50 PM   Specimen: Nasopharyngeal Swab; Nasopharyngeal(NP) swabs in vial transport medium  Result Value Ref Range Status   SARS Coronavirus 2 by RT PCR POSITIVE (A) NEGATIVE Final    Comment: RESULT CALLED TO, READ BACK BY AND VERIFIED WITH: SAPPELT,J ON 05/15/21 AT 1950 BY LOY,C (NOTE) SARS-CoV-2 target nucleic acids are DETECTED.  The SARS-CoV-2 RNA is generally detectable in upper respiratory specimens during the acute phase of infection. Positive results are indicative of the presence of the identified virus, but do not rule out bacterial infection or co-infection with other pathogens not detected by the test. Clinical correlation with patient history and other diagnostic information is necessary to determine patient infection status. The expected result is Negative.  Fact Sheet for  Patients: EntrepreneurPulse.com.au  Fact Sheet for Healthcare Providers: IncredibleEmployment.be  This test is not yet approved or cleared by the [place] FDA and  has been authorized for detection and/or diagnosis of SARS-CoV-2 by FDA under an Emergency Use Authorization (EUA).  This EUA will remain in effect (meaning this test c an be used) for the duration of  the COVID-19 declaration under Section 564(b)(1) of the Act, 21 U.S.C. section 360bbb-3(b)(1), unless the authorization is terminated or revoked sooner.     Influenza A by PCR NEGATIVE NEGATIVE Final   Influenza B by PCR NEGATIVE NEGATIVE Final    Comment: (NOTE) The Xpert Xpress SARS-CoV-2/FLU/RSV plus assay is intended as an aid in the diagnosis of influenza from  Nasopharyngeal swab specimens and should not be used as a sole basis for treatment. Nasal washings and aspirates are unacceptable for Xpert Xpress SARS-CoV-2/FLU/RSV testing.  Fact Sheet for Patients: EntrepreneurPulse.com.au  Fact Sheet for Healthcare Providers: IncredibleEmployment.be  This test is not yet approved or cleared by the [place] FDA and has been authorized for detection and/or diagnosis of SARS-CoV-2 by FDA under an Emergency Use Authorization (EUA). This EUA will remain in effect (meaning this test can be used) for the duration of the COVID-19 declaration under Section 564(b)(1) of the Act, 21 U.S.C. section 360bbb-3(b)(1), unless the authorization is terminated or revoked.  Performed at [facility], [address] [place], [place] [address]   Blood culture (routine x 2)     Status: Abnormal   Collection Time: 05/15/21  8:53 PM   Specimen: BLOOD  Result Value Ref Range Status   Specimen Description   Final    BLOOD RIGHT ANTECUBITAL Performed at [facility], [address] [place], [place] [address]    Special Requests   Final    BOTTLES DRAWN AEROBIC  AND ANAEROBIC Blood Culture adequate volume Performed at [facility], [address], [place], [place] [address]    Culture  Setup Time   Final    GRAM NEGATIVE RODS ANAEROBIC BOTTLE ONLY aerobic bottle GNR Gram Stain Report Called to,Read Back By and Verified With: DAWKINS @ 1020 ON [id number] BY HENDERSON L CRITICAL RESULT CALLED TO, READ BACK BY AND VERIFIED WITH: G,COFFEE PHARMD @1328  05/16/21 EB IN BOTH AEROBIC AND ANAEROBIC BOTTLES Performed at [facility], [place] [address] [place],  [address]    Culture ESCHERICHIA COLI (A)  Final   Report Status 05/18/2021 FINAL  Final   Organism ID, Bacteria ESCHERICHIA COLI  Final      Susceptibility   Escherichia coli - MIC*    AMPICILLIN >=32 RESISTANT Resistant     CEFAZOLIN >=64 RESISTANT Resistant     CEFEPIME <=0.12 SENSITIVE Sensitive     CEFTAZIDIME <=1 SENSITIVE Sensitive     CEFTRIAXONE 1 SENSITIVE Sensitive     CIPROFLOXACIN <=0.25 SENSITIVE Sensitive     GENTAMICIN <=1 SENSITIVE Sensitive     IMIPENEM <=0.25 SENSITIVE Sensitive     TRIMETH/SULFA <=20 SENSITIVE Sensitive     AMPICILLIN/SULBACTAM >=32 RESISTANT Resistant     * ESCHERICHIA COLI  Blood Culture ID Panel (Reflexed)     Status: Abnormal   Collection Time: 05/15/21  8:53 PM  Result Value Ref Range Status   Enterococcus faecalis NOT DETECTED NOT DETECTED Final   Enterococcus Faecium NOT DETECTED NOT DETECTED Final   Listeria monocytogenes NOT DETECTED NOT DETECTED Final   Staphylococcus species NOT DETECTED NOT DETECTED Final   Staphylococcus aureus (BCID) NOT DETECTED NOT DETECTED Final   Staphylococcus epidermidis NOT DETECTED NOT DETECTED Final   Staphylococcus lugdunensis NOT DETECTED NOT DETECTED Final   Streptococcus species NOT DETECTED NOT DETECTED Final   Streptococcus agalactiae NOT DETECTED NOT DETECTED Final   Streptococcus pneumoniae NOT DETECTED NOT DETECTED Final   Streptococcus pyogenes NOT DETECTED NOT DETECTED Final    A.calcoaceticus-baumannii NOT DETECTED NOT DETECTED Final   Bacteroides fragilis NOT DETECTED NOT DETECTED Final   Enterobacterales DETECTED (A) NOT DETECTED Final    Comment: Enterobacterales represent a large order of gram negative bacteria, not a single organism. CRITICAL RESULT CALLED TO, READ BACK BY AND VERIFIED WITH: G,COFFEE PHARMD @1328  05/16/21 EB    Enterobacter cloacae complex NOT DETECTED NOT DETECTED Final  Escherichia coli DETECTED (A) NOT DETECTED Final    Comment: CRITICAL RESULT CALLED TO, READ BACK BY AND VERIFIED WITH: G,COFFEE PHARMD @1328  05/16/21 EB    Klebsiella aerogenes NOT DETECTED NOT DETECTED Final   Klebsiella oxytoca NOT DETECTED NOT DETECTED Final   Klebsiella pneumoniae NOT DETECTED NOT DETECTED Final   Proteus species NOT DETECTED NOT DETECTED Final   Salmonella species NOT DETECTED NOT DETECTED Final   Serratia marcescens NOT DETECTED NOT DETECTED Final   Haemophilus influenzae NOT DETECTED NOT DETECTED Final   Neisseria meningitidis NOT DETECTED NOT DETECTED Final   Pseudomonas aeruginosa NOT DETECTED NOT DETECTED Final   Stenotrophomonas maltophilia NOT DETECTED NOT DETECTED Final   Candida albicans NOT DETECTED NOT DETECTED Final   Candida auris NOT DETECTED NOT DETECTED Final   Candida glabrata NOT DETECTED NOT DETECTED Final   Candida krusei NOT DETECTED NOT DETECTED Final   Candida parapsilosis NOT DETECTED NOT DETECTED Final   Candida tropicalis NOT DETECTED NOT DETECTED Final   Cryptococcus neoformans/gattii NOT DETECTED NOT DETECTED Final   CTX-M ESBL NOT DETECTED NOT DETECTED Final   Carbapenem resistance IMP NOT DETECTED NOT DETECTED Final   Carbapenem resistance KPC NOT DETECTED NOT DETECTED Final   Carbapenem resistance NDM NOT DETECTED NOT DETECTED Final   Carbapenem resist OXA 48 LIKE NOT DETECTED NOT DETECTED Final   Carbapenem resistance VIM NOT DETECTED NOT DETECTED Final    Comment: Performed at [facility], [address], [place], [place] [address]  Blood culture (routine x 2)     Status: Abnormal   Collection Time: 05/16/21 12:12 AM   Specimen: Right Antecubital; Blood  Result Value Ref Range Status   Specimen Description   Final    RIGHT ANTECUBITAL BOTTLES DRAWN AEROBIC ONLY Performed at [facility], [address], [place], [place] [address]    Special Requests   Final    Blood Culture adequate volume Performed at [facility], [address] [place], [place] [address]    Culture  Setup Time   Final    GRAM NEGATIVE RODS AEROBIC BOTTLE ONLY Gram Stain Report Called to,Read Back By and Verified With: DAWKINS N @ 1410 ON 05/16/21 BY HENDERSON L CRITICAL RESULT CALLED TO, READ BACK BY AND VERIFIED WITH: PHARMD JAMES LEDFORD 05/17/21@3 :28 BY TW Performed at [facility], [place] [address], [place], [place] [address]    Culture ESCHERICHIA COLI (A)  Final   Report Status 05/19/2021 FINAL  Final   Organism ID, Bacteria ESCHERICHIA COLI  Final      Susceptibility   Escherichia coli - MIC*    AMPICILLIN >=32 RESISTANT Resistant     CEFAZOLIN >=64 RESISTANT Resistant     CEFEPIME <=0.12 SENSITIVE Sensitive     CEFTAZIDIME <=1 SENSITIVE Sensitive     CEFTRIAXONE 0.5 SENSITIVE Sensitive     CIPROFLOXACIN <=0.25 SENSITIVE Sensitive     GENTAMICIN <=1 SENSITIVE Sensitive     IMIPENEM <=0.25 SENSITIVE Sensitive     TRIMETH/SULFA <=20 SENSITIVE Sensitive     AMPICILLIN/SULBACTAM >=32 RESISTANT Resistant     PIP/TAZO 8 SENSITIVE Sensitive     * ESCHERICHIA COLI  Blood Culture ID Panel (Reflexed)     Status: Abnormal   Collection Time: 05/16/21 12:12 AM  Result Value Ref Range Status   Enterococcus faecalis NOT DETECTED NOT DETECTED Final   Enterococcus Faecium NOT DETECTED NOT DETECTED Final   Listeria monocytogenes NOT DETECTED NOT DETECTED Final   Staphylococcus species NOT DETECTED  NOT DETECTED Final   Staphylococcus aureus (BCID) NOT DETECTED NOT DETECTED Final   Staphylococcus epidermidis  NOT DETECTED NOT DETECTED Final   Staphylococcus lugdunensis NOT DETECTED NOT DETECTED Final   Streptococcus species NOT DETECTED NOT DETECTED Final   Streptococcus agalactiae NOT DETECTED NOT DETECTED Final   Streptococcus pneumoniae NOT DETECTED NOT DETECTED Final   Streptococcus pyogenes NOT DETECTED NOT DETECTED Final   A.calcoaceticus-baumannii NOT DETECTED NOT DETECTED Final   Bacteroides fragilis NOT DETECTED NOT DETECTED Final   Enterobacterales DETECTED (A) NOT DETECTED Final    Comment: Enterobacterales represent a large order of gram negative bacteria, not a single organism. CRITICAL RESULT CALLED TO, READ BACK BY AND VERIFIED WITH: PHARMD JAMES LEDFORD 05/17/21@3 :28 BY TW    Enterobacter cloacae complex NOT DETECTED NOT DETECTED Final   Escherichia coli DETECTED (A) NOT DETECTED Final    Comment: CRITICAL RESULT CALLED TO, READ BACK BY AND VERIFIED WITH: PHARMD JAMES LEDFORD 05/17/21@3 :28 BY TW    Klebsiella aerogenes NOT DETECTED NOT DETECTED Final   Klebsiella oxytoca NOT DETECTED NOT DETECTED Final   Klebsiella pneumoniae NOT DETECTED NOT DETECTED Final   Proteus species NOT DETECTED NOT DETECTED Final   Salmonella species NOT DETECTED NOT DETECTED Final   Serratia marcescens NOT DETECTED NOT DETECTED Final   Haemophilus influenzae NOT DETECTED NOT DETECTED Final   Neisseria meningitidis NOT DETECTED NOT DETECTED Final   Pseudomonas aeruginosa NOT DETECTED NOT DETECTED Final   Stenotrophomonas maltophilia NOT DETECTED NOT DETECTED Final   Candida albicans NOT DETECTED NOT DETECTED Final   Candida auris NOT DETECTED NOT DETECTED Final   Candida glabrata NOT DETECTED NOT DETECTED Final   Candida krusei NOT DETECTED NOT DETECTED Final   Candida parapsilosis NOT DETECTED NOT DETECTED Final   Candida tropicalis NOT DETECTED NOT DETECTED Final   Cryptococcus neoformans/gattii NOT DETECTED NOT DETECTED Final   CTX-M ESBL NOT DETECTED NOT DETECTED Final   Carbapenem  resistance IMP NOT DETECTED NOT DETECTED Final   Carbapenem resistance KPC NOT DETECTED NOT DETECTED Final   Carbapenem resistance NDM NOT DETECTED NOT DETECTED Final   Carbapenem resist OXA 48 LIKE NOT DETECTED NOT DETECTED Final   Carbapenem resistance VIM NOT DETECTED NOT DETECTED Final    Comment: Performed at [facility], [address], [place], [place] [address]  MRSA Next Gen by PCR, Nasal     Status: None   Collection Time: 05/16/21  2:43 AM   Specimen: Nasal Mucosa; Nasal Swab  Result Value Ref Range Status   MRSA by PCR Next Gen NOT DETECTED NOT DETECTED Final    Comment: (NOTE) The GeneXpert MRSA Assay (FDA approved for NASAL specimens only), is one component of a comprehensive MRSA colonization surveillance program. It is not intended to diagnose MRSA infection nor to guide or monitor treatment for MRSA infections. Test performance is not FDA approved in patients less than 68 years old. Performed at [facility], [place] [address], [place], [place] [address]      Serology:   Imaging: If present, new imagings (plain films, ct scans, and mri) have been personally visualized and interpreted; radiology reports have been reviewed. Decision making incorporated into the Impression / Recommendations.  11/28 ct abd pelv/chest with contrast 1. No acute findings within the chest. No pulmonary embolism is seen, with mild study limitations detailed above. No evidence of pneumonia or pulmonary edema. 2. Metastatic lymphadenopathy is redemonstrated within the mediastinum, bilateral axillae, and supraclavicular LEFT neck. Also redemonstrated is  the mass versus conglomerate lymphadenopathy in the axillary tail region of the LEFT breast. These findings are stable compared to the recent chest CT of 04/09/2021. 3. Lytic-appearing lesion within the T6 vertebral body, and destructive/lytic changes within the sternum, suspicious for metastatic osseous disease. Consider nuclear  medicine bone scan or PET scan for confirmation. 4. Numerous small hypodense lesions within the bilateral liver lobes, as also described on the earlier CT abdomen report of 12/12/2019, presumably liver metastases. 5. Cholelithiasis without evidence of acute cholecystitis. 6. Colonic diverticulosis without evidence of acute diverticulitis. 7. No acute findings within the abdomen or pelvis. No bowel obstruction or evidence of acute bowel wall inflammation. No free fluid or abscess collection. No evidence of acute solid organ abnormality. 8. No evidence of acute osseous fracture or dislocation is seen.   11/28 ct left hip and left knee without contrast 1. No evidence of acute fracture or dislocation at the LEFT hip. 2. 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 1. 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 No fracture is identified, although sensitivity for subtle fractures is reduced due to the degree of spurring and associated cortical irregularities. 2. Moderate knee effusion. 3. Atherosclerosis. 4. Subcutaneous edema anterior to the patella.     Jabier Mutton, [facility] [place] [place] [phone] pager    05/19/2021, 2:12 PM

## 2021-05-19 NOTE — Progress Notes (Signed)
Pharmacy Antibiotic Note  Crystal Vega is a 70 y.o. female admitted on 05/15/2021 with acute blood loss anemia and found to have e. coli bacteremia. Pharmacy has been consulted for remdesivir dosing for incidental COVID.  Patient found to be incidentally positive for COVID on 12/16, but was asymptomatic (remained on room air with saturations wnl). Per discussion with Dr. Tyrell Antonio 12/20, patient's respiratory status remains stable on room air but has a new worsening cough and would like to treat for incidental COVID x3 days.  Plan: Remdesivir 200mg  x1, followed by remdesivir 100mg  daily x2 days  Height: 5\' 3"  (160 cm) Weight: 120.8 kg (266 lb 5.1 oz) IBW/kg (Calculated) : 52.4  Temp (24hrs), Avg:98.3 F (36.8 C), Min:97.9 F (36.6 C), Max:98.7 F (37.1 C)  Recent Labs  Lab 05/15/21 1659 05/15/21 2053 05/16/21 0013 05/16/21 1948 05/16/21 1949 05/17/21 0441 05/17/21 1351 05/18/21 0159 05/18/21 0745 05/19/21 0503  WBC  --   --    < >  --    < > 6.1 7.3 8.1 6.5 6.9  CREATININE 1.85*  --   --  0.77  --  0.67  --  0.64  --  0.62  LATICACIDVEN  --  1.3  --   --   --   --   --   --   --   --    < > = values in this interval not displayed.    Estimated Creatinine Clearance: 83.6 mL/min (by C-G formula based on SCr of 0.62 mg/dL).    No Known Allergies  Microbiology results: 12/16 COVID: positive    Thank you for allowing pharmacy to be a part of this patients care.  Ardyth Harps, PharmD Clinical Pharmacist

## 2021-05-19 NOTE — Progress Notes (Signed)
Subjective: Patient reports some mild nausea this morning. Patient unable to tell if vaginal bleeding is improving on new megace dosing. Patient frustrated by her inability to obtain a functioning phone to contact her husband.    Objective: Blood pressure 134/85, pulse (!) 103, temperature 98.2 F (36.8 C), temperature source Oral, resp. rate 17, height 5\' 3"  (1.6 m), weight 120.8 kg, SpO2 97 %.   GENERAL: Well-developed, well-nourished female in no acute distress.  LUNGS: Clear to auscultation bilaterally.  HEART: Regular rate and rhythm. ABDOMEN: Soft, nontender, nondistended. No organomegaly. PELVIC: Not indicated EXTREMITIES: No cyanosis, clubbing, or edema, 2+ distal pulses.    Assessment/Plan: 70 yo with postmenopausal vaginal bleeding and active metastatic breast and colon cancer - Plan for [facility] tomorrow morning at 8:30am - NPO after midnight except for sips with meds - Continue current care per primary team  LOS: 4 days    Crystal Vega 05/19/2021, 10:11 AM

## 2021-05-20 ENCOUNTER — Ambulatory Visit (HOSPITAL_COMMUNITY): Payer: Medicare HMO | Admitting: Hematology

## 2021-05-20 ENCOUNTER — Inpatient Hospital Stay (HOSPITAL_COMMUNITY): Payer: Medicare HMO

## 2021-05-20 ENCOUNTER — Inpatient Hospital Stay (HOSPITAL_COMMUNITY): Payer: Medicare HMO | Admitting: Anesthesiology

## 2021-05-20 ENCOUNTER — Encounter (HOSPITAL_COMMUNITY)
Admission: EM | Disposition: A | Discharge status: Skilled Nursing Facility | Payer: Self-pay | Source: Home / Self Care | Attending: Internal Medicine

## 2021-05-20 DIAGNOSIS — N950 Postmenopausal bleeding: Secondary | ICD-10-CM

## 2021-05-20 HISTORY — PX: DILATION AND CURETTAGE OF UTERUS: SHX78

## 2021-05-20 HISTORY — PX: IR REMOVAL TUN ACCESS W/ PORT W/O FL MOD SED: IMG2290

## 2021-05-20 LAB — CBC
HCT: 26 % — ABNORMAL LOW (ref 36.0–46.0)
Hemoglobin: 8.5 g/dL — ABNORMAL LOW (ref 12.0–15.0)
MCH: 29 pg (ref 26.0–34.0)
MCHC: 32.7 g/dL (ref 30.0–36.0)
MCV: 88.7 fL (ref 80.0–100.0)
Platelets: 126 10*3/uL — ABNORMAL LOW (ref 150–400)
RBC: 2.93 MIL/uL — ABNORMAL LOW (ref 3.87–5.11)
RDW: 16.5 % — ABNORMAL HIGH (ref 11.5–15.5)
WBC: 7.2 10*3/uL (ref 4.0–10.5)
nRBC: 0 % (ref 0.0–0.2)

## 2021-05-20 LAB — BASIC METABOLIC PANEL
Anion gap: 5 (ref 5–15)
BUN: 12 mg/dL (ref 8–23)
CO2: 26 mmol/L (ref 22–32)
Calcium: 7.5 mg/dL — ABNORMAL LOW (ref 8.9–10.3)
Chloride: 104 mmol/L (ref 98–111)
Creatinine, Ser: 0.77 mg/dL (ref 0.44–1.00)
GFR, Estimated: >60 mL/min (ref >60)
Glucose, Bld: 125 mg/dL — ABNORMAL HIGH (ref 70–99)
Potassium: 3.5 mmol/L (ref 3.5–5.1)
Sodium: 135 mmol/L (ref 135–145)

## 2021-05-20 LAB — GLUCOSE, CAPILLARY
Glucose-Capillary: 103 mg/dL — ABNORMAL HIGH (ref 70–99)
Glucose-Capillary: 113 mg/dL — ABNORMAL HIGH (ref 70–99)
Glucose-Capillary: 147 mg/dL — ABNORMAL HIGH (ref 70–99)
Glucose-Capillary: 160 mg/dL — ABNORMAL HIGH (ref 70–99)
Glucose-Capillary: 90 mg/dL (ref 70–99)

## 2021-05-20 LAB — VITAMIN K1, SERUM: VITAMIN K1: 0.15 ng/mL (ref 0.10–2.20)

## 2021-05-20 SURGERY — DILATION AND CURETTAGE
Anesthesia: General

## 2021-05-20 MED ORDER — LACTATED RINGERS IV SOLN: INTRAVENOUS | Status: DC | PRN

## 2021-05-20 MED ORDER — AMISULPRIDE (ANTIEMETIC) 5 MG/2ML IV SOLN: 10.0000 mg | Freq: Once | INTRAVENOUS | Status: DC | PRN

## 2021-05-20 MED ORDER — 0.9 % SODIUM CHLORIDE (POUR BTL) OPTIME
TOPICAL | Status: DC | PRN
  Administered 2021-05-20: 10:00:00 1000 mL

## 2021-05-20 MED ORDER — FENTANYL CITRATE (PF) 250 MCG/5ML IJ SOLN
INTRAMUSCULAR | Status: AC
  Filled 2021-05-20: qty 5

## 2021-05-20 MED ORDER — ONDANSETRON HCL 4 MG/2ML IJ SOLN
INTRAMUSCULAR | Status: DC | PRN
  Administered 2021-05-20: 4 mg via INTRAVENOUS

## 2021-05-20 MED ORDER — METOPROLOL SUCCINATE ER 25 MG PO TB24
25.0000 mg | ORAL_TABLET | Freq: Every day | ORAL | Status: DC
  Administered 2021-05-20 – 2021-05-26 (×7): 25 mg via ORAL
  Filled 2021-05-20 (×7): qty 1

## 2021-05-20 MED ORDER — OXYCODONE HCL 5 MG/5ML PO SOLN: 5.0000 mg | Freq: Once | ORAL | Status: DC | PRN

## 2021-05-20 MED ORDER — MIDAZOLAM HCL 2 MG/2ML IJ SOLN
INTRAMUSCULAR | Status: DC | PRN
  Administered 2021-05-20: 1 mg via INTRAVENOUS

## 2021-05-20 MED ORDER — PROPOFOL 10 MG/ML IV BOLUS
INTRAVENOUS | Status: AC
  Filled 2021-05-20: qty 20

## 2021-05-20 MED ORDER — FENTANYL CITRATE (PF) 250 MCG/5ML IJ SOLN
INTRAMUSCULAR | Status: DC | PRN
  Administered 2021-05-20: 50 ug via INTRAVENOUS

## 2021-05-20 MED ORDER — LIDOCAINE 2% (20 MG/ML) 5 ML SYRINGE
INTRAMUSCULAR | Status: DC | PRN
  Administered 2021-05-20: 60 mg via INTRAVENOUS

## 2021-05-20 MED ORDER — SUCCINYLCHOLINE CHLORIDE 200 MG/10ML IV SOSY
PREFILLED_SYRINGE | INTRAVENOUS | Status: DC | PRN
  Administered 2021-05-20: 200 mg via INTRAVENOUS

## 2021-05-20 MED ORDER — LIDOCAINE-EPINEPHRINE 1 %-1:100000 IJ SOLN
INTRAMUSCULAR | Status: AC
  Administered 2021-05-20: 13:00:00 15 mL
  Filled 2021-05-20: qty 1

## 2021-05-20 MED ORDER — OXYCODONE HCL 5 MG PO TABS: 5.0000 mg | ORAL_TABLET | Freq: Once | ORAL | Status: DC | PRN

## 2021-05-20 MED ORDER — POTASSIUM CHLORIDE 20 MEQ PO PACK
40.0000 meq | PACK | Freq: Once | ORAL | Status: AC
  Administered 2021-05-20: 14:00:00 40 meq via ORAL
  Filled 2021-05-20: qty 2

## 2021-05-20 MED ORDER — ONDANSETRON HCL 4 MG/2ML IJ SOLN: 4.0000 mg | Freq: Once | INTRAMUSCULAR | Status: DC | PRN

## 2021-05-20 MED ORDER — FENTANYL CITRATE (PF) 100 MCG/2ML IJ SOLN: 25.0000 ug | INTRAMUSCULAR | Status: DC | PRN

## 2021-05-20 MED ORDER — FENTANYL CITRATE (PF) 100 MCG/2ML IJ SOLN
INTRAMUSCULAR | Status: DC | PRN
  Administered 2021-05-20: 50 ug via INTRAVENOUS

## 2021-05-20 MED ORDER — FENTANYL CITRATE (PF) 100 MCG/2ML IJ SOLN
INTRAMUSCULAR | Status: AC
  Filled 2021-05-20: qty 2

## 2021-05-20 MED ORDER — MIDAZOLAM HCL 2 MG/2ML IJ SOLN
INTRAMUSCULAR | Status: AC
  Filled 2021-05-20: qty 2

## 2021-05-20 MED ORDER — MIDAZOLAM HCL 2 MG/2ML IJ SOLN
INTRAMUSCULAR | Status: DC | PRN
  Administered 2021-05-20 (×2): 1 mg via INTRAVENOUS

## 2021-05-20 MED ORDER — PROPOFOL 10 MG/ML IV BOLUS
INTRAVENOUS | Status: DC | PRN
  Administered 2021-05-20: 200 mg via INTRAVENOUS

## 2021-05-20 SURGICAL SUPPLY — 14 items
CATH ROBINSON RED A/P 16FR (CATHETERS) ×3 IMPLANT
CNTNR URN SCR LID CUP LEK RST (MISCELLANEOUS) ×2 IMPLANT
CONT SPEC 4OZ STRL OR WHT (MISCELLANEOUS) ×2
GAUZE 4X4 16PLY ~~LOC~~+RFID DBL (SPONGE) ×1 IMPLANT
GLOVE SURG POLYISO LF SZ6.5 (GLOVE) ×3 IMPLANT
GLOVE SURG UNDER POLY LF SZ6.5 (GLOVE) ×3 IMPLANT
GLOVE SURG UNDER POLY LF SZ7 (GLOVE) ×3 IMPLANT
GOWN STRL REUS W/ TWL LRG LVL3 (GOWN DISPOSABLE) ×4 IMPLANT
GOWN STRL REUS W/TWL LRG LVL3 (GOWN DISPOSABLE) ×4
KIT TURNOVER KIT B (KITS) ×3 IMPLANT
NS IRRIG 1000ML POUR BTL (IV SOLUTION) ×3 IMPLANT
PACK VAGINAL MINOR WOMEN LF (CUSTOM PROCEDURE TRAY) ×3 IMPLANT
TOWEL GREEN STERILE FF (TOWEL DISPOSABLE) ×6 IMPLANT
UNDERPAD 30X36 HEAVY ABSORB (UNDERPADS AND DIAPERS) ×3 IMPLANT

## 2021-05-20 NOTE — Progress Notes (Addendum)
PROGRESS NOTE    Crystal Vega  KAJ:[id number] DOB: [birth year] DOA: 05/15/2021 PCP: Lemmie Evens, MD   Brief Narrative: 70 year old with past medical history significant for breast cancer undergoing chemo, colonic adenocarcinoma, iron deficiency anemia, DVT on Eliquis presents to [facility] ED on 12/16 with acute blood loss anemia.  Patient has been feeling more fatigue and report vaginal bleeding for about 2 weeks.  She denies frank red bloody stool but does have a history of colon adenocarcinoma and has occasional bloody stool.  Recent transvaginal ultrasound showed thickened endometrium up to 19 mm with plans to receive endometrial sampling to rule out carcinoma.  Patient was admitted with hypotension, systolic blood pressure in the 60.  Sodium 125 creatinine 1.8, hemoglobin of 5, platelets 97 COVID-positive.  OB/gynecology and GI consulted.  Patient received blood transfusion, started on Megace, she was also started on IV pressors Levophed.  She was admitted by the ICU team.  Patient stabilized, transferred to [facility] 12/19.  Admitted hemorrhagic and septic shock. Received Blood transfusion. GYN consulted, plan for D and C uterus 12/21. She was also found to have E coli Bacteremia. ID recommend Port cath removal. IR consulted. Plan for [facility] removal 12/21.  Assessment & Plan:    Hypovolemic Shock, secondary to Vaginal Bleeding. Acute Blood loss anemia;  Transvaginal US 09/24/2020; 19 mm thick endometrium.  Eliquis was placed on hold.  Patient was seen by OB/GYN. Underwent D&C on 12/21. Remains on Megace. Hemoglobin stable.  She did receive 2 units of PRBC on 12/19.  Sinus tachycardia  Likely multifactorial in setting of acute blood loss and sepsis.  Noted to be on beta-blocker at home which was held at the time of admission.  Could be experiencing some rebound tachycardia.  We will reinitiate her metoprolol.  Septic Shock, due to E coli Bacteremia;  Blood culture positive for E  coli 05/16/2021 ID consulted. They recommend oncology input in regards to [facility].  Case was discussed with patient's oncologist, Dr. Delton Coombes.  He was okay with removal of port.  To be done by interventional radiology. TEE is planned for 12/22.   Patient is complaining of left hip pain.  Plain films did not show any acute findings.  MRI has been ordered by ID. Remains on ceftriaxone.  Acute kidney injury Seems to have resolved with IV fluids.  Monitor urine output.    Hyponatremia Resolved with IV fluids.  Incidental Covid:  Noted to be on oxygen saturating in the late 90s.  Patient does mention cough.  Does not have any fever.  Patient was started on Remdesivir for 3 days.   Will check her inflammatory markers.  Repeat chest x-ray.    Hx of DVT Was on Eliquis which is currently on hold due to vaginal bleeding.  Resume when cleared to do so by GYN.  Hypertension/hyperlipidemia Medications on hold currently.  Monitor blood pressures closely.  Metoprolol to be resumed as discussed above.  Was also on ARB which will remain on hold for now.  Thrombocytopenia Stable platelet counts.  Severe hypomagnesemia Was repleted.  We will recheck tomorrow.  History of breast and colon cancer Followed by Dr. Delton Coombes with hematology/oncology.  Diabetes mellitus type 2, controlled Monitor CBGs.  SSI.  Goals of care Palliative care has been consulted.  Morbid obesity Estimated body mass index is 47.18 kg/m as calculated from the following:   Height as of this encounter: 5\' 3"  (1.6 m).   Weight as of this encounter: 120.8 kg.  DVT prophylaxis: SCD Code Status: DNR Family Communication: No family at bedside.  Discussed with patient Disposition: Unclear for now  Status is: Inpatient  Remains inpatient appropriate because: admitted with vaginal bleeding, hypovolemic shock. Still unstable tachycardic.         Consultants:  GYN CCM admitted patient.  ID   Procedures:   none  Antimicrobials:    Subjective: Patient just returned from her D&C.  Somewhat sedated.  Does not have any significant pain currently.  Denies any chest pain or shortness of breath.  Has been coughing some.  Objective: Vitals:   05/20/21 0320 05/20/21 0945 05/20/21 1000 05/20/21 1023  BP: 101/72 (!) 141/90 125/82 140/65  Pulse: (!) 106 (!) 110 (!) 109 (!) 104  Resp: 19 16 15 17   Temp: 99.3 F (37.4 C) 98.7 F (37.1 C) 98.7 F (37.1 C) 98.7 F (37.1 C)  TempSrc: Oral   Oral  SpO2: 100% 99% 100% 100%  Weight:      Height:        Intake/Output Summary (Last 24 hours) at 05/20/2021 1135 Last data filed at 05/20/2021 1005 Gross per 24 hour  Intake 926.81 ml  Output 1650 ml  Net -723.19 ml    Filed Weights   05/15/21 1601 05/16/21 0244  Weight: 114.3 kg 120.8 kg    Examination:  General appearance: Somnolent but easily arousable.  In no distress Resp: Normal effort at rest.  Diminished air entry at the bases.  No wheezing or rhonchi. Cardio: S1-S2 is normal regular.  No S3-S4.  No rubs murmurs or bruit GI: Abdomen is soft.  Nontender nondistended.  Bowel sounds are present normal.  No masses organomegaly Extremities: No edema.   Neurologic: Somnolent but easily arousable.  No obvious focal neurological deficits    Data Reviewed: I have personally reviewed following labs and imaging studies  CBC: Recent Labs  Lab 05/15/21 1658 05/16/21 0013 05/16/21 1949 05/17/21 1351 05/18/21 0159 05/18/21 0745 05/19/21 0503 05/20/21 0444  WBC 5.9 8.3   < > 7.3 8.1 6.5 6.9 7.2  NEUTROABS 3.7 6.4  --   --   --   --   --   --   HGB 5.0* 9.3*   < > 7.8* 7.5* 7.4* 8.8* 8.5*  HCT 16.5* 28.9*   < > 23.9* 23.2* 23.4* 27.3* 26.0*  MCV 94.3 92.0   < > 90.2 86.9 89.3 88.3 88.7  PLT 97* 103*   101*   < > 99* 102* 96* 121* 126*   < > = values in this interval not displayed.    Basic Metabolic Panel: Recent Labs  Lab 05/16/21 1948 05/17/21 0441 05/18/21 0159  05/18/21 0745 05/19/21 0503 05/20/21 0444  NA 133* 135 133*  --  135 135  K 4.0 3.9 4.1  --  3.6 3.5  CL 101 102 102  --  104 104  CO2 24 27 22   --  26 26  GLUCOSE 129* 98 92  --  109* 125*  BUN 33* 23 16  --  11 12  CREATININE 0.77 0.67 0.64  --  0.62 0.77  CALCIUM 7.9* 8.0* 7.7*  --  7.4* 7.5*  MG 1.5*  --   --  0.9* 1.6*  --     GFR: Estimated Creatinine Clearance: 83.6 mL/min (by C-G formula based on SCr of 0.77 mg/dL). Liver Function Tests: Recent Labs  Lab 05/15/21 1659 05/16/21 1948  AST 28 29  ALT 12 14  ALKPHOS 119 117  BILITOT 0.1* 0.8  PROT 5.9* 5.9*  ALBUMIN 1.8* 1.7*    Coagulation Profile: Recent Labs  Lab 05/15/21 1658 05/16/21 0013 05/17/21 1420  INR 2.0* 1.9* 1.3*    CBG: Recent Labs  Lab 05/19/21 1610 05/19/21 2056 05/20/21 0009 05/20/21 0336 05/20/21 0950  GLUCAP 168* 98 147* 113* 90    Anemia Panel: Recent Labs    05/18/21 0745 05/18/21 1145  VITAMINB12 456  --   FOLATE  --  17.8  FERRITIN 151  --   TIBC 224*  --   IRON 34  --   RETICCTPCT  --  2.4    Sepsis Labs: Recent Labs  Lab 05/15/21 2053  LATICACIDVEN 1.3     Recent Results (from the past 240 hour(s))  Resp Panel by RT-PCR (Flu A&B, Covid) Nasopharyngeal Swab     Status: Abnormal   Collection Time: 05/15/21  6:50 PM   Specimen: Nasopharyngeal Swab; Nasopharyngeal(NP) swabs in vial transport medium  Result Value Ref Range Status   SARS Coronavirus 2 by RT PCR POSITIVE (A) NEGATIVE Final    Comment: RESULT CALLED TO, READ BACK BY AND VERIFIED WITH: SAPPELT,J ON 05/15/21 AT 1950 BY LOY,C (NOTE) SARS-CoV-2 target nucleic acids are DETECTED.  The SARS-CoV-2 RNA is generally detectable in upper respiratory specimens during the acute phase of infection. Positive results are indicative of the presence of the identified virus, but do not rule out bacterial infection or co-infection with other pathogens not detected by the test. Clinical correlation with patient  history and other diagnostic information is necessary to determine patient infection status. The expected result is Negative.  Fact Sheet for Patients: EntrepreneurPulse.com.au  Fact Sheet for Healthcare Providers: IncredibleEmployment.be  This test is not yet approved or cleared by the [place] FDA and  has been authorized for detection and/or diagnosis of SARS-CoV-2 by FDA under an Emergency Use Authorization (EUA).  This EUA will remain in effect (meaning this test c an be used) for the duration of  the COVID-19 declaration under Section 564(b)(1) of the Act, 21 U.S.C. section 360bbb-3(b)(1), unless the authorization is terminated or revoked sooner.     Influenza A by PCR NEGATIVE NEGATIVE Final   Influenza B by PCR NEGATIVE NEGATIVE Final    Comment: (NOTE) The Xpert Xpress SARS-CoV-2/FLU/RSV plus assay is intended as an aid in the diagnosis of influenza from Nasopharyngeal swab specimens and should not be used as a sole basis for treatment. Nasal washings and aspirates are unacceptable for Xpert Xpress SARS-CoV-2/FLU/RSV testing.  Fact Sheet for Patients: EntrepreneurPulse.com.au  Fact Sheet for Healthcare Providers: IncredibleEmployment.be  This test is not yet approved or cleared by the [place] FDA and has been authorized for detection and/or diagnosis of SARS-CoV-2 by FDA under an Emergency Use Authorization (EUA). This EUA will remain in effect (meaning this test can be used) for the duration of the COVID-19 declaration under Section 564(b)(1) of the Act, 21 U.S.C. section 360bbb-3(b)(1), unless the authorization is terminated or revoked.  Performed at [facility], [address], [place], [place] [address]   Blood culture (routine x 2)     Status: Abnormal   Collection Time: 05/15/21  8:53 PM   Specimen: BLOOD  Result Value Ref Range Status   Specimen Description   Final     BLOOD RIGHT ANTECUBITAL Performed at [facility], [address] [place], [place] [address]    Special Requests   Final    BOTTLES DRAWN AEROBIC AND ANAEROBIC Blood Culture adequate volume  Performed at [facility], [address] [place], [place] [address]    Culture  Setup Time   Final    GRAM NEGATIVE RODS ANAEROBIC BOTTLE ONLY aerobic bottle GNR Gram Stain Report Called to,Read Back By and Verified With: DAWKINS @ 1020 ON [id number] BY HENDERSON L CRITICAL RESULT CALLED TO, READ BACK BY AND VERIFIED WITH: G,COFFEE PHARMD @1328  05/16/21 EB IN BOTH AEROBIC AND ANAEROBIC BOTTLES Performed at [facility], [place] [address], [place], [place] [address]    Culture ESCHERICHIA COLI (A)  Final   Report Status 05/18/2021 FINAL  Final   Organism ID, Bacteria ESCHERICHIA COLI  Final      Susceptibility   Escherichia coli - MIC*    AMPICILLIN >=32 RESISTANT Resistant     CEFAZOLIN >=64 RESISTANT Resistant     CEFEPIME <=0.12 SENSITIVE Sensitive     CEFTAZIDIME <=1 SENSITIVE Sensitive     CEFTRIAXONE 1 SENSITIVE Sensitive     CIPROFLOXACIN <=0.25 SENSITIVE Sensitive     GENTAMICIN <=1 SENSITIVE Sensitive     IMIPENEM <=0.25 SENSITIVE Sensitive     TRIMETH/SULFA <=20 SENSITIVE Sensitive     AMPICILLIN/SULBACTAM >=32 RESISTANT Resistant     * ESCHERICHIA COLI  Blood Culture ID Panel (Reflexed)     Status: Abnormal   Collection Time: 05/15/21  8:53 PM  Result Value Ref Range Status   Enterococcus faecalis NOT DETECTED NOT DETECTED Final   Enterococcus Faecium NOT DETECTED NOT DETECTED Final   Listeria monocytogenes NOT DETECTED NOT DETECTED Final   Staphylococcus species NOT DETECTED NOT DETECTED Final   Staphylococcus aureus (BCID) NOT DETECTED NOT DETECTED Final   Staphylococcus epidermidis NOT DETECTED NOT DETECTED Final   Staphylococcus lugdunensis NOT DETECTED NOT DETECTED Final   Streptococcus species NOT DETECTED NOT DETECTED Final   Streptococcus agalactiae NOT DETECTED NOT  DETECTED Final   Streptococcus pneumoniae NOT DETECTED NOT DETECTED Final   Streptococcus pyogenes NOT DETECTED NOT DETECTED Final   A.calcoaceticus-baumannii NOT DETECTED NOT DETECTED Final   Bacteroides fragilis NOT DETECTED NOT DETECTED Final   Enterobacterales DETECTED (A) NOT DETECTED Final    Comment: Enterobacterales represent a large order of gram negative bacteria, not a single organism. CRITICAL RESULT CALLED TO, READ BACK BY AND VERIFIED WITH: G,COFFEE PHARMD @1328  05/16/21 EB    Enterobacter cloacae complex NOT DETECTED NOT DETECTED Final   Escherichia coli DETECTED (A) NOT DETECTED Final    Comment: CRITICAL RESULT CALLED TO, READ BACK BY AND VERIFIED WITH: G,COFFEE PHARMD @1328  05/16/21 EB    Klebsiella aerogenes NOT DETECTED NOT DETECTED Final   Klebsiella oxytoca NOT DETECTED NOT DETECTED Final   Klebsiella pneumoniae NOT DETECTED NOT DETECTED Final   Proteus species NOT DETECTED NOT DETECTED Final   Salmonella species NOT DETECTED NOT DETECTED Final   Serratia marcescens NOT DETECTED NOT DETECTED Final   Haemophilus influenzae NOT DETECTED NOT DETECTED Final   Neisseria meningitidis NOT DETECTED NOT DETECTED Final   Pseudomonas aeruginosa NOT DETECTED NOT DETECTED Final   Stenotrophomonas maltophilia NOT DETECTED NOT DETECTED Final   Candida albicans NOT DETECTED NOT DETECTED Final   Candida auris NOT DETECTED NOT DETECTED Final   Candida glabrata NOT DETECTED NOT DETECTED Final   Candida krusei NOT DETECTED NOT DETECTED Final   Candida parapsilosis NOT DETECTED NOT DETECTED Final   Candida tropicalis NOT DETECTED NOT DETECTED Final   Cryptococcus neoformans/gattii NOT DETECTED NOT DETECTED Final   CTX-M ESBL NOT DETECTED NOT DETECTED Final   Carbapenem resistance IMP  NOT DETECTED NOT DETECTED Final   Carbapenem resistance KPC NOT DETECTED NOT DETECTED Final   Carbapenem resistance NDM NOT DETECTED NOT DETECTED Final   Carbapenem resist OXA 48 LIKE NOT DETECTED  NOT DETECTED Final   Carbapenem resistance VIM NOT DETECTED NOT DETECTED Final    Comment: Performed at [facility], [place] [address], [place], [place] [address]  Blood culture (routine x 2)     Status: Abnormal   Collection Time: 05/16/21 12:12 AM   Specimen: Right Antecubital; Blood  Result Value Ref Range Status   Specimen Description   Final    RIGHT ANTECUBITAL BOTTLES DRAWN AEROBIC ONLY Performed at [facility], [address] [place], [place] [address]    Special Requests   Final    Blood Culture adequate volume Performed at [facility], [address] [place], [place] [address]    Culture  Setup Time   Final    GRAM NEGATIVE RODS AEROBIC BOTTLE ONLY Gram Stain Report Called to,Read Back By and Verified With: DAWKINS N @ 1410 ON 05/16/21 BY HENDERSON L CRITICAL RESULT CALLED TO, READ BACK BY AND VERIFIED WITH: PHARMD JAMES LEDFORD 05/17/21@3 :28 BY TW Performed at [facility], [place] [address], [place], [place] [address]    Culture ESCHERICHIA COLI (A)  Final   Report Status 05/19/2021 FINAL  Final   Organism ID, Bacteria ESCHERICHIA COLI  Final      Susceptibility   Escherichia coli - MIC*    AMPICILLIN >=32 RESISTANT Resistant     CEFAZOLIN >=64 RESISTANT Resistant     CEFEPIME <=0.12 SENSITIVE Sensitive     CEFTAZIDIME <=1 SENSITIVE Sensitive     CEFTRIAXONE 0.5 SENSITIVE Sensitive     CIPROFLOXACIN <=0.25 SENSITIVE Sensitive     GENTAMICIN <=1 SENSITIVE Sensitive     IMIPENEM <=0.25 SENSITIVE Sensitive     TRIMETH/SULFA <=20 SENSITIVE Sensitive     AMPICILLIN/SULBACTAM >=32 RESISTANT Resistant     PIP/TAZO 8 SENSITIVE Sensitive     * ESCHERICHIA COLI  Blood Culture ID Panel (Reflexed)     Status: Abnormal   Collection Time: 05/16/21 12:12 AM  Result Value Ref Range Status   Enterococcus faecalis NOT DETECTED NOT DETECTED Final   Enterococcus Faecium NOT DETECTED NOT DETECTED Final   Listeria monocytogenes NOT DETECTED NOT DETECTED Final    Staphylococcus species NOT DETECTED NOT DETECTED Final   Staphylococcus aureus (BCID) NOT DETECTED NOT DETECTED Final   Staphylococcus epidermidis NOT DETECTED NOT DETECTED Final   Staphylococcus lugdunensis NOT DETECTED NOT DETECTED Final   Streptococcus species NOT DETECTED NOT DETECTED Final   Streptococcus agalactiae NOT DETECTED NOT DETECTED Final   Streptococcus pneumoniae NOT DETECTED NOT DETECTED Final   Streptococcus pyogenes NOT DETECTED NOT DETECTED Final   A.calcoaceticus-baumannii NOT DETECTED NOT DETECTED Final   Bacteroides fragilis NOT DETECTED NOT DETECTED Final   Enterobacterales DETECTED (A) NOT DETECTED Final    Comment: Enterobacterales represent a large order of gram negative bacteria, not a single organism. CRITICAL RESULT CALLED TO, READ BACK BY AND VERIFIED WITH: PHARMD JAMES LEDFORD 05/17/21@3 :28 BY TW    Enterobacter cloacae complex NOT DETECTED NOT DETECTED Final   Escherichia coli DETECTED (A) NOT DETECTED Final    Comment: CRITICAL RESULT CALLED TO, READ BACK BY AND VERIFIED WITH: PHARMD JAMES LEDFORD 05/17/21@3 :28 BY TW    Klebsiella aerogenes NOT DETECTED NOT DETECTED Final   Klebsiella oxytoca NOT DETECTED NOT DETECTED Final   Klebsiella pneumoniae NOT DETECTED NOT DETECTED Final  Proteus species NOT DETECTED NOT DETECTED Final   Salmonella species NOT DETECTED NOT DETECTED Final   Serratia marcescens NOT DETECTED NOT DETECTED Final   Haemophilus influenzae NOT DETECTED NOT DETECTED Final   Neisseria meningitidis NOT DETECTED NOT DETECTED Final   Pseudomonas aeruginosa NOT DETECTED NOT DETECTED Final   Stenotrophomonas maltophilia NOT DETECTED NOT DETECTED Final   Candida albicans NOT DETECTED NOT DETECTED Final   Candida auris NOT DETECTED NOT DETECTED Final   Candida glabrata NOT DETECTED NOT DETECTED Final   Candida krusei NOT DETECTED NOT DETECTED Final   Candida parapsilosis NOT DETECTED NOT DETECTED Final   Candida tropicalis NOT DETECTED NOT  DETECTED Final   Cryptococcus neoformans/gattii NOT DETECTED NOT DETECTED Final   CTX-M ESBL NOT DETECTED NOT DETECTED Final   Carbapenem resistance IMP NOT DETECTED NOT DETECTED Final   Carbapenem resistance KPC NOT DETECTED NOT DETECTED Final   Carbapenem resistance NDM NOT DETECTED NOT DETECTED Final   Carbapenem resist OXA 48 LIKE NOT DETECTED NOT DETECTED Final   Carbapenem resistance VIM NOT DETECTED NOT DETECTED Final    Comment: Performed at [facility], [address], [place], [place] [address]  MRSA Next Gen by PCR, Nasal     Status: None   Collection Time: 05/16/21  2:43 AM   Specimen: Nasal Mucosa; Nasal Swab  Result Value Ref Range Status   MRSA by PCR Next Gen NOT DETECTED NOT DETECTED Final    Comment: (NOTE) The GeneXpert MRSA Assay (FDA approved for NASAL specimens only), is one component of a comprehensive MRSA colonization surveillance program. It is not intended to diagnose MRSA infection nor to guide or monitor treatment for MRSA infections. Test performance is not FDA approved in patients less than 74 years old. Performed at [facility], [place] [address], [place], [place] [address]           Radiology Studies: DG HIP UNILAT WITH PELVIS 2-3 VIEWS LEFT  Result Date: 05/18/2021 CLINICAL DATA:  Chronic left hip pain. EXAM: DG HIP (WITH OR WITHOUT PELVIS) 2-3V LEFT COMPARISON:  None. FINDINGS: There is no evidence of an acute hip fracture or dislocation. Marked severity chronic and degenerative changes are seen in the form of joint space narrowing, acetabular sclerosis and subchondral cyst formation. Lobulated, calcified uterine fibroids are suspected. IMPRESSION: Marked severity chronic and degenerative changes in the left hip. Electronically Signed   By: Virgina Norfolk M.D.   On: 05/18/2021 20:46        Scheduled Meds:  (feeding supplement) PROSource Plus  30 mL Oral BID BM   Chlorhexidine Gluconate Cloth  6 each Topical Q0600    collagenase   Topical Daily   feeding supplement  237 mL Oral TID BM   fentaNYL  1 patch Transdermal Q72H   insulin aspart  0-9 Units Subcutaneous Q4H   megestrol  80 mg Oral Q8H   metoprolol succinate  25 mg Oral Daily   multivitamin with minerals  1 tablet Oral Daily   pantoprazole  40 mg Oral BID   pravastatin  10 mg Oral q1800   sodium chloride flush  10-40 mL Intracatheter Q12H   Continuous Infusions:  cefTRIAXone (ROCEPHIN)  IV 2 g (05/19/21 2054)   remdesivir 100 mg in NS 100 mL       LOS: 5 days     Bonnielee Haff, MD [facility]   If 7PM-7AM, please contact night-coverage [email or web address]  05/20/2021, 11:35 AM

## 2021-05-20 NOTE — Op Note (Signed)
PREOPERATIVE DIAGNOSIS:  postmenopausal uterine bleeding. POSTOPERATIVE DIAGNOSIS: The same PROCEDURE: Dilation and Curettage. SURGEON:  Dr. Mora Bellman   INDICATIONS: 70 y.o. with postmenopausal vaginal bleeding here for scheduled surgery.   Risks of surgery were discussed with the patient including but not limited to: bleeding which may require transfusion; infection which may require antibiotics; injury to uterus or surrounding organs; intrauterine scarring which may impair future fertility; need for additional procedures including laparotomy or laparoscopy; and other postoperative/anesthesia complications. Written informed consent was obtained.    FINDINGS:  A 10- week size uterus.    ANESTHESIA:   General,  INTRAVENOUS FLUIDS:  400 ml of LR ESTIMATED BLOOD LOSS:  Less than 20 ml SPECIMENS: Endometrial curettings sent to pathology COMPLICATIONS:  None immediate.  PROCEDURE DETAILS:  The patient received intravenous antibiotics while in the preoperative area.  She was then taken to the operating room where general anesthesia was administered and was found to be adequate.  After an adequate timeout was performed, she was placed in the dorsal lithotomy position (her right leg was placed in stirrups but due to limited mobility of her leg hip, her left leg was semi bent at the knee on the bed) and examined; then prepped and draped in the sterile manner.   A foley catheter was already present. A speculum was then placed in the patient's vagina and a single tooth tenaculum was applied to the anterior lip of the cervix. The cervix was sounded to 10 cm and easily accommodate a small curette without the need for cervical dilation.  A sharp curettage was then performed to obtain a moderate amount of endometrial curettings.  The tenaculum was removed from the anterior lip of the cervix and the vaginal speculum was removed after noting good hemostasis.  The patient tolerated the procedure well and was  taken to the recovery area awake, extubated and in stable condition.

## 2021-05-20 NOTE — Sedation Documentation (Signed)
Vital signs stable. 

## 2021-05-20 NOTE — Progress Notes (Signed)
° ° ° °  Referral received for Crystal Vega for goals of care discussion. Chart reviewed and updates received from RN. I met with the patient and introduced palliative medicine and the role we fill. The patient then got a call from family that she was waiting for. We agreed to follow-up tomorrow for a more in-depth [place] discussion.  Detailed note and recommendations to follow once GOC has been completed.   Thank you for your referral and allowing PMT to assist in [place] care.   Walden Field, NP Palliative Medicine Team Phone: [phone]  NO CHARGE

## 2021-05-20 NOTE — Anesthesia Postprocedure Evaluation (Signed)
Anesthesia Post Note  Patient: Crystal Vega  Procedure(s) Performed: DILATATION AND CURETTAGE     Patient location during evaluation: PACU Anesthesia Type: General Level of consciousness: awake and alert, oriented and patient cooperative Pain management: pain level controlled Vital Signs Assessment: post-procedure vital signs reviewed and stable Respiratory status: spontaneous breathing, nonlabored ventilation and respiratory function stable Cardiovascular status: blood pressure returned to baseline and stable Postop Assessment: no apparent nausea or vomiting Anesthetic complications: no   No notable events documented.  Last Vitals:  Vitals:   05/20/21 0320 05/20/21 0945  BP: 101/72 (!) 141/90  Pulse: (!) 106 (!) 110  Resp: 19 16  Temp: 37.4 C 37.1 C  SpO2: 100% 99%    Last Pain:  Vitals:   05/20/21 0945  TempSrc:   PainSc: 0-No pain    LLE Motor Response: Purposeful movement (05/20/21 0945)   RLE Motor Response: Purposeful movement (05/20/21 0945)        Pervis Hocking

## 2021-05-20 NOTE — Progress Notes (Signed)
MRI attempted on pt.  When she was put in scanner the pt became very anxious and started  yelling to stop and take her out.  Rn notified and we can try again once meds are orderded.

## 2021-05-20 NOTE — Anesthesia Procedure Notes (Signed)
Procedure Name: Intubation Date/Time: 05/20/2021 9:11 AM Performed by: Eligha Bridegroom, CRNA Pre-anesthesia Checklist: Patient identified, Emergency Drugs available, Patient being monitored, Suction available and Timeout performed Patient Re-evaluated:Patient Re-evaluated prior to induction Oxygen Delivery Method: Circle system utilized Preoxygenation: Pre-oxygenation with 100% oxygen Induction Type: IV induction Grade View: Grade II Tube type: Oral Tube size: 7.0 mm Number of attempts: 1 Placement Confirmation: ETT inserted through vocal cords under direct vision, positive ETCO2 and breath sounds checked- equal and bilateral Secured at: 21 cm Tube secured with: Tape Dental Injury: Teeth and Oropharynx as per pre-operative assessment

## 2021-05-20 NOTE — Procedures (Signed)
Interventional Radiology Procedure Note  Procedure: Port removal  Findings: Please refer to procedural dictation for full description.  Complications: None immediate  Estimated Blood Loss: < 5 mL  Recommendations: Local wound care.  Follow up with IR as needed.   Ruthann Cancer, MD

## 2021-05-20 NOTE — Sedation Documentation (Signed)
Patient is resting comfortably. VSS °

## 2021-05-20 NOTE — Sedation Documentation (Signed)
Patient is resting comfortably. 

## 2021-05-20 NOTE — Progress Notes (Signed)
OT Cancellation Note  Patient Details Name: BABS DABBS MRN: [id number] DOB: 10-21-1950   Cancelled Treatment:    Reason Eval/Treat Not Completed: Patient at procedure or test/ unavailable. Pt s/p D&C of uterus today. Will follow up as able.  Helane Gunther, OT/L  [facility] 05/20/2021, 5:21 PM

## 2021-05-20 NOTE — Progress Notes (Signed)
Subjective: Patient reports feeling frustrated this morning and anxious. She reports some improvement in her vaginal bleeding  Objective: Blood pressure 134/85, pulse (!) 103, temperature 98.2 F (36.8 C), temperature source Oral, resp. rate 17, height 5\' 3"  (1.6 m), weight 120.8 kg, SpO2 97 %.   GENERAL: Well-developed, well-nourished female in no acute distress.  LUNGS: Clear to auscultation bilaterally.  HEART: Regular rate and rhythm. ABDOMEN: Soft, nontender, nondistended. No organomegaly. PELVIC: Not indicated EXTREMITIES: No cyanosis, clubbing, or edema, 2+ distal pulses.    Assessment/Plan: - 70 yo with active metastatic breast and colon cancer with intermittent postmenopausal vaginal bleeding since 2016 and thickened endometrium on 08/2020 ultrasound here for D&C. Risks, benefits and alternatives were explained including but not limited to risks of bleeding, infection, uterine perforation and damage to adjacent organs. Patient verbalized understanding and all questions were answered. - Continue megace at current dose - Continue current care per primary team  LOS: 5 days    Crystal Vega 05/20/2021, 8:18 AM

## 2021-05-20 NOTE — Anesthesia Preprocedure Evaluation (Addendum)
Anesthesia Evaluation  Patient identified by MRN, date of birth, ID band Patient awake    Reviewed: Allergy & Precautions, NPO status , Patient's Chart, lab work & pertinent test results, reviewed documented beta blocker date and time   Airway Mallampati: II  TM Distance: >3 FB Neck ROM: Full    Dental  (+) Poor Dentition, Loose, Dental Advisory Given, Missing,    Pulmonary former smoker,  05/15/21 COVID +   Pulmonary exam normal breath sounds clear to auscultation       Cardiovascular hypertension, Pt. on medications and Pt. on home beta blockers + DVT (LLE)  Normal cardiovascular exam Rhythm:Regular Rate:Normal  Echo 04/2021:  1. Left ventricular ejection fraction, by estimation, is 60 to 65%. The  left ventricle has normal function. Left ventricular endocardial border  not optimally defined to evaluate regional wall motion. There is moderate  left ventricular hypertrophy. Left  ventricular diastolic parameters are indeterminate.  2. Right ventricular systolic function is normal. The right ventricular  size is mildly enlarged. There is normal pulmonary artery systolic  pressure. The estimated right ventricular systolic pressure is 57.8 mmHg.  3. The mitral valve is grossly normal. Trivial mitral valve  regurgitation.  4. The aortic valve is tricuspid. Aortic valve regurgitation is not  visualized. Aortic valve sclerosis is present, with no evidence of aortic  valve stenosis. Aortic valve mean gradient measures 3.0 mmHg.  5. The inferior vena cava is normal in size with greater than 50%  respiratory variability, suggesting right atrial pressure of 3 mmHg.    Neuro/Psych negative neurological ROS  negative psych ROS   GI/Hepatic negative GI ROS, Neg liver ROS,   Endo/Other  diabetes, Well Controlled, Type 2, Oral Hypoglycemic AgentsMorbid obesityBMI 47 a1c 5.5  Renal/GU negative Renal ROS  Female GU  complaint     Musculoskeletal  (+) Arthritis , Osteoarthritis,    Abdominal (+) + obese,   Peds  Hematology  (+) Blood dyscrasia, anemia , 8.5/26, plt 126   Anesthesia Other Findings L breast ca  eliquis-   Reproductive/Obstetrics negative OB ROS                           Anesthesia Physical Anesthesia Plan  ASA: 4  Anesthesia Plan: General   Post-op Pain Management: Tylenol PO (pre-op)   Induction: Intravenous  PONV Risk Score and Plan: 3 and Ondansetron, Dexamethasone, Midazolam and Treatment may vary due to age or medical condition  Airway Management Planned: LMA  Additional Equipment: None  Intra-op Plan:   Post-operative Plan: Extubation in OR  Informed Consent: I have reviewed the patients History and Physical, chart, labs and discussed the procedure including the risks, benefits and alternatives for the proposed anesthesia with the patient or authorized representative who has indicated his/her understanding and acceptance.   Patient has DNR.  Suspend DNR.   Dental advisory given  Plan Discussed with: CRNA  Anesthesia Plan Comments:        Anesthesia Quick Evaluation

## 2021-05-20 NOTE — Transfer of Care (Signed)
Immediate Anesthesia Transfer of Care Note  Patient: Crystal Vega  Procedure(s) Performed: DILATATION AND CURETTAGE  Patient Location: PACU  Anesthesia Type:General  Level of Consciousness: awake, alert  and oriented  Airway & Oxygen Therapy: Patient Spontanous Breathing and Patient connected to nasal cannula oxygen  Post-op Assessment: Report given to RN and Post -op Vital signs reviewed and stable  Post vital signs: Reviewed and stable  Last Vitals:  Vitals Value Taken Time  BP 141/90 05/20/21 0945  Temp    Pulse 113 05/20/21 0952  Resp 22 05/20/21 0952  SpO2 100 % 05/20/21 0952  Vitals shown include unvalidated device data.  Last Pain:  Vitals:   05/20/21 0320  TempSrc: Oral  PainSc:          Complications: No notable events documented.

## 2021-05-21 ENCOUNTER — Encounter (HOSPITAL_COMMUNITY): Payer: Self-pay | Admitting: Obstetrics and Gynecology

## 2021-05-21 ENCOUNTER — Inpatient Hospital Stay (HOSPITAL_COMMUNITY): Payer: Medicare HMO

## 2021-05-21 ENCOUNTER — Encounter (HOSPITAL_COMMUNITY): Payer: Self-pay | Admitting: Certified Registered Nurse Anesthetist

## 2021-05-21 DIAGNOSIS — M25462 Effusion, left knee: Secondary | ICD-10-CM

## 2021-05-21 DIAGNOSIS — Z515 Encounter for palliative care: Secondary | ICD-10-CM

## 2021-05-21 DIAGNOSIS — Z7189 Other specified counseling: Secondary | ICD-10-CM

## 2021-05-21 DIAGNOSIS — R52 Pain, unspecified: Secondary | ICD-10-CM

## 2021-05-21 DIAGNOSIS — M25559 Pain in unspecified hip: Secondary | ICD-10-CM

## 2021-05-21 DIAGNOSIS — C50919 Malignant neoplasm of unspecified site of unspecified female breast: Secondary | ICD-10-CM

## 2021-05-21 DIAGNOSIS — C7951 Secondary malignant neoplasm of bone: Secondary | ICD-10-CM

## 2021-05-21 DIAGNOSIS — R9389 Abnormal findings on diagnostic imaging of other specified body structures: Secondary | ICD-10-CM

## 2021-05-21 DIAGNOSIS — M25562 Pain in left knee: Secondary | ICD-10-CM

## 2021-05-21 DIAGNOSIS — C787 Secondary malignant neoplasm of liver and intrahepatic bile duct: Secondary | ICD-10-CM

## 2021-05-21 LAB — CBC
HCT: 30.1 % — ABNORMAL LOW (ref 36.0–46.0)
Hemoglobin: 9.5 g/dL — ABNORMAL LOW (ref 12.0–15.0)
MCH: 28.4 pg (ref 26.0–34.0)
MCHC: 31.6 g/dL (ref 30.0–36.0)
MCV: 89.9 fL (ref 80.0–100.0)
Platelets: 130 10*3/uL — ABNORMAL LOW (ref 150–400)
RBC: 3.35 MIL/uL — ABNORMAL LOW (ref 3.87–5.11)
RDW: 16.5 % — ABNORMAL HIGH (ref 11.5–15.5)
WBC: 6.7 10*3/uL (ref 4.0–10.5)
nRBC: 0 % (ref 0.0–0.2)

## 2021-05-21 LAB — BASIC METABOLIC PANEL
Anion gap: 6 (ref 5–15)
BUN: 12 mg/dL (ref 8–23)
CO2: 25 mmol/L (ref 22–32)
Calcium: 7.6 mg/dL — ABNORMAL LOW (ref 8.9–10.3)
Chloride: 102 mmol/L (ref 98–111)
Creatinine, Ser: 0.59 mg/dL (ref 0.44–1.00)
GFR, Estimated: >60 mL/min (ref >60)
Glucose, Bld: 105 mg/dL — ABNORMAL HIGH (ref 70–99)
Potassium: 4.3 mmol/L (ref 3.5–5.1)
Sodium: 133 mmol/L — ABNORMAL LOW (ref 135–145)

## 2021-05-21 LAB — GLUCOSE, CAPILLARY
Glucose-Capillary: 103 mg/dL — ABNORMAL HIGH (ref 70–99)
Glucose-Capillary: 112 mg/dL — ABNORMAL HIGH (ref 70–99)
Glucose-Capillary: 118 mg/dL — ABNORMAL HIGH (ref 70–99)
Glucose-Capillary: 128 mg/dL — ABNORMAL HIGH (ref 70–99)
Glucose-Capillary: 78 mg/dL (ref 70–99)
Glucose-Capillary: 82 mg/dL (ref 70–99)
Glucose-Capillary: 95 mg/dL (ref 70–99)

## 2021-05-21 LAB — MAGNESIUM: Magnesium: 1.2 mg/dL — ABNORMAL LOW (ref 1.7–2.4)

## 2021-05-21 LAB — C-REACTIVE PROTEIN: CRP: 9.6 mg/dL — ABNORMAL HIGH (ref <1.0)

## 2021-05-21 MED ORDER — MAGNESIUM SULFATE 4 GM/100ML IV SOLN
4.0000 g | Freq: Two times a day (BID) | INTRAVENOUS | Status: AC
  Administered 2021-05-21 (×2): 4 g via INTRAVENOUS
  Filled 2021-05-21 (×2): qty 100

## 2021-05-21 NOTE — Progress Notes (Signed)
[place] [facility] Infect[facility]  Date of Admission:  05/15/2021     Lines:  Chronic port-a-cath -- removed 12/21   Abx: 12/18-c ceftriaxone   12/17 cefepime                                                            12/07-17 amox 11/29-12/07 vanc-->ceftriaxone   Assessment: Ecoli bacteremia (R amp/sulb and cefazolin) Septic shock Incidental covid positive test Recent (11/29) high burden strep mitis/oralis bacteremia no TEE  Metastatic left cancer (left breast -- on outpatient chemo) Hx of right colon adenocx Hx dvt   70 yo female colon cancer/breast cancer on chemo (last cycle ?end of October -- on hold due to infection) here with vaginal bleeding/blood loss anemia and septic shock due to ecoli bacteremia   Agree source of the ecoli bacteremia is difficult (?gi from colon cancer vs port; no urinary sx and no urine cx obtained). Outside of culturing the port tip, would be impossible to determine source now   Also of concern is her recent strep mitis/oralis high burden bacteremia from 11/29. No culture repeat was done. The tte was negative. The source could have been the port/gut vs endocarditis.    At this juncture, would be prudent to remove the port given severe presentation/gram negative sepsis, and previous bacteremia of unclear source. Would also check TEE as she is at risk for endocarditis (marantic changes and new bacteremia of unclear source)   She had chronic dvt previously but less likely for that to be source of either of these bacteremic episodes    ------ 12/22 assessment No fever/leukocytosis (this admission -- bcx obtained for hypotension initially) Tee planned 12/23 Left hip pain/knee pain remained severe and I haven't been able to passively examine ROM and haven't seen her done active ROM since my initial assessment. While initial ct doesn't mention joint effusion of the hip (moderate effusion seen in the knee), in setting of  cancer/bacteremia, if it doesn't improve a more definitive imaging modality vs arthrocentesis is needed. Not able to tolerate MRI due to hip/knee pain  Had d&c uterus 12/21    -continue ceftriaxone -await tee -if hip/knee pain or ROM doesn't improve in a few days, would need to r/o septic arthritis process -agree with palliative care input    Principal Problem:   Circulatory failure (HCC) Active Problems:   E coli bacteremia   Hypovolemic shock (HCC)   COVID-19   Symptomatic anemia   Vaginal bleeding   AKI (acute kidney injury) ([place])   Hyperglycemia due to diabetes mellitus ([place])   Gastrointestinal hemorrhage with melena   Severe sepsis with septic shock (HCC)   Postmenopausal vaginal bleeding   No Known Allergies  Scheduled Meds:  (feeding supplement) PROSource Plus  30 mL Oral BID BM   Chlorhexidine Gluconate Cloth  6 each Topical Q0600   collagenase   Topical Daily   feeding supplement  237 mL Oral TID BM   fentaNYL  1 patch Transdermal Q72H   insulin aspart  0-9 Units Subcutaneous Q4H   megestrol  80 mg Oral Q8H   metoprolol succinate  25 mg Oral Daily   multivitamin with minerals  1 tablet Oral Daily   pantoprazole  40 mg Oral BID  pravastatin  10 mg Oral q1800   sodium chloride flush  10-40 mL Intracatheter Q12H   Continuous Infusions:  cefTRIAXone (ROCEPHIN)  IV 2 g (05/20/21 2050)   magnesium sulfate bolus IVPB 4 g (05/21/21 1132)   remdesivir 100 mg in NS 100 mL 100 mg (05/20/21 1628)   PRN Meds:.busPIRone, docusate sodium, fentaNYL, lip balm, midazolam, oxyCODONE, polyethylene glycol, sodium chloride flush   SUBJECTIVE: Port removed Unable to tolerate MRI Anxious/flustered "doesn't want more procedures at this time -- too many things thrown at me and I am tired"   Review of Systems: ROS All other ROS was negative, except mentioned above     OBJECTIVE: Vitals:   05/21/21 0538 05/21/21 0700 05/21/21 0947 05/21/21 1247  BP: (!) 121/91 115/64  121/80 139/66  Pulse: 95 77  89  Resp: 18 16  17   Temp: 98.5 F (36.9 C) 97.8 F (36.6 C) 98 F (36.7 C) 97.8 F (36.6 C)  TempSrc: Oral Oral Oral Oral  SpO2: 100% 100% 100% 94%  Weight:      Height:       Body mass index is 47.18 kg/m.  Physical Exam  General/constitutional: no distress, pleasant HEENT: Normocephalic, PER, Conj Clear, EOMI, Oropharynx clear Neck supple CV: rrr no mrg Lungs: clear to auscultation, normal respiratory effort Abd: Soft, Nontender Ext: no edema Skin: No Rash Neuro: nonfocal MSK: tender left hip lateroposteriorly on palpation, doesn't allow me to passively do ROM hip but some active rom noted without issue   Central line -- right chest port had been removed   Lab Results Lab Results  Component Value Date   WBC 6.7 05/21/2021   HGB 9.5 (L) 05/21/2021   HCT 30.1 (L) 05/21/2021   MCV 89.9 05/21/2021   PLT 130 (L) 05/21/2021    Lab Results  Component Value Date   CREATININE 0.59 05/21/2021   BUN 12 05/21/2021   NA 133 (L) 05/21/2021   K 4.3 05/21/2021   CL 102 05/21/2021   CO2 25 05/21/2021    Lab Results  Component Value Date   ALT 14 05/16/2021   AST 29 05/16/2021   ALKPHOS 117 05/16/2021   BILITOT 0.8 05/16/2021      Microbiology: Recent Results (from the past 240 hour(s))  Resp Panel by RT-PCR (Flu A&B, Covid) Nasopharyngeal Swab     Status: Abnormal   Collection Time: 05/15/21  6:50 PM   Specimen: Nasopharyngeal Swab; Nasopharyngeal(NP) swabs in vial transport medium  Result Value Ref Range Status   SARS Coronavirus 2 by RT PCR POSITIVE (A) NEGATIVE Final    Comment: RESULT CALLED TO, READ BACK BY AND VERIFIED WITH: SAPPELT,J ON 05/15/21 AT 1950 BY LOY,C (NOTE) SARS-CoV-2 target nucleic acids are DETECTED.  The SARS-CoV-2 RNA is generally detectable in upper respiratory specimens during the acute phase of infection. Positive results are indicative of the presence of the identified virus, but do not rule out  bacterial infection or co-infection with other pathogens not detected by the test. Clinical correlation with patient history and other diagnostic information is necessary to determine patient infection status. The expected result is Negative.  Fact Sheet for Patients: EntrepreneurPulse.com.au  Fact Sheet for Healthcare Providers: IncredibleEmployment.be  This test is not yet approved or cleared by the [place] FDA and  has been authorized for detection and/or diagnosis of SARS-CoV-2 by FDA under an Emergency Use Authorization (EUA).  This EUA will remain in effect (meaning this test c an be used) for the duration of  the COVID-19 declaration under Section 564(b)(1) of the Act, 21 U.S.C. section 360bbb-3(b)(1), unless the authorization is terminated or revoked sooner.     Influenza A by PCR NEGATIVE NEGATIVE Final   Influenza B by PCR NEGATIVE NEGATIVE Final    Comment: (NOTE) The Xpert Xpress SARS-CoV-2/FLU/RSV plus assay is intended as an aid in the diagnosis of influenza from Nasopharyngeal swab specimens and should not be used as a sole basis for treatment. Nasal washings and aspirates are unacceptable for Xpert Xpress SARS-CoV-2/FLU/RSV testing.  Fact Sheet for Patients: EntrepreneurPulse.com.au  Fact Sheet for Healthcare Providers: IncredibleEmployment.be  This test is not yet approved or cleared by the [place] FDA and has been authorized for detection and/or diagnosis of SARS-CoV-2 by FDA under an Emergency Use Authorization (EUA). This EUA will remain in effect (meaning this test can be used) for the duration of the COVID-19 declaration under Section 564(b)(1) of the Act, 21 U.S.C. section 360bbb-3(b)(1), unless the authorization is terminated or revoked.  Performed at [facility], [address], [place], [place] [address]   Blood culture (routine x 2)     Status: Abnormal    Collection Time: 05/15/21  8:53 PM   Specimen: BLOOD  Result Value Ref Range Status   Specimen Description   Final    BLOOD RIGHT ANTECUBITAL Performed at [facility], [address] [place], [place] [address]    Special Requests   Final    BOTTLES DRAWN AEROBIC AND ANAEROBIC Blood Culture adequate volume Performed at [facility], [address] [place], [place] [address]    Culture  Setup Time   Final    GRAM NEGATIVE RODS ANAEROBIC BOTTLE ONLY aerobic bottle GNR Gram Stain Report Called to,Read Back By and Verified With: DAWKINS @ 1020 ON [id number] BY HENDERSON L CRITICAL RESULT CALLED TO, READ BACK BY AND VERIFIED WITH: G,COFFEE PHARMD @1328  05/16/21 EB IN BOTH AEROBIC AND ANAEROBIC BOTTLES Performed at [facility], [place] [address] [place], [place] [address]    Culture ESCHERICHIA COLI (A)  Final   Report Status 05/18/2021 FINAL  Final   Organism ID, Bacteria ESCHERICHIA COLI  Final      Susceptibility   Escherichia coli - MIC*    AMPICILLIN >=32 RESISTANT Resistant     CEFAZOLIN >=64 RESISTANT Resistant     CEFEPIME <=0.12 SENSITIVE Sensitive     CEFTAZIDIME <=1 SENSITIVE Sensitive     CEFTRIAXONE 1 SENSITIVE Sensitive     CIPROFLOXACIN <=0.25 SENSITIVE Sensitive     GENTAMICIN <=1 SENSITIVE Sensitive     IMIPENEM <=0.25 SENSITIVE Sensitive     TRIMETH/SULFA <=20 SENSITIVE Sensitive     AMPICILLIN/SULBACTAM >=32 RESISTANT Resistant     * ESCHERICHIA COLI  Blood Culture ID Panel (Reflexed)     Status: Abnormal   Collection Time: 05/15/21  8:53 PM  Result Value Ref Range Status   Enterococcus faecalis NOT DETECTED NOT DETECTED Final   Enterococcus Faecium NOT DETECTED NOT DETECTED Final   Listeria monocytogenes NOT DETECTED NOT DETECTED Final   Staphylococcus species NOT DETECTED NOT DETECTED Final   Staphylococcus aureus (BCID) NOT DETECTED NOT DETECTED Final   Staphylococcus epidermidis NOT DETECTED NOT DETECTED Final   Staphylococcus lugdunensis NOT DETECTED NOT  DETECTED Final   Streptococcus species NOT DETECTED NOT DETECTED Final   Streptococcus agalactiae NOT DETECTED NOT DETECTED Final   Streptococcus pneumoniae NOT DETECTED NOT DETECTED Final   Streptococcus pyogenes NOT DETECTED NOT DETECTED Final   A.calcoaceticus-baumannii NOT DETECTED NOT DETECTED  Final   Bacteroides fragilis NOT DETECTED NOT DETECTED Final   Enterobacterales DETECTED (A) NOT DETECTED Final    Comment: Enterobacterales represent a large order of gram negative bacteria, not a single organism. CRITICAL RESULT CALLED TO, READ BACK BY AND VERIFIED WITH: G,COFFEE PHARMD @1328  05/16/21 EB    Enterobacter cloacae complex NOT DETECTED NOT DETECTED Final   Escherichia coli DETECTED (A) NOT DETECTED Final    Comment: CRITICAL RESULT CALLED TO, READ BACK BY AND VERIFIED WITH: G,COFFEE PHARMD @1328  05/16/21 EB    Klebsiella aerogenes NOT DETECTED NOT DETECTED Final   Klebsiella oxytoca NOT DETECTED NOT DETECTED Final   Klebsiella pneumoniae NOT DETECTED NOT DETECTED Final   Proteus species NOT DETECTED NOT DETECTED Final   Salmonella species NOT DETECTED NOT DETECTED Final   Serratia marcescens NOT DETECTED NOT DETECTED Final   Haemophilus influenzae NOT DETECTED NOT DETECTED Final   Neisseria meningitidis NOT DETECTED NOT DETECTED Final   Pseudomonas aeruginosa NOT DETECTED NOT DETECTED Final   Stenotrophomonas maltophilia NOT DETECTED NOT DETECTED Final   Candida albicans NOT DETECTED NOT DETECTED Final   Candida auris NOT DETECTED NOT DETECTED Final   Candida glabrata NOT DETECTED NOT DETECTED Final   Candida krusei NOT DETECTED NOT DETECTED Final   Candida parapsilosis NOT DETECTED NOT DETECTED Final   Candida tropicalis NOT DETECTED NOT DETECTED Final   Cryptococcus neoformans/gattii NOT DETECTED NOT DETECTED Final   CTX-M ESBL NOT DETECTED NOT DETECTED Final   Carbapenem resistance IMP NOT DETECTED NOT DETECTED Final   Carbapenem resistance KPC NOT DETECTED NOT  DETECTED Final   Carbapenem resistance NDM NOT DETECTED NOT DETECTED Final   Carbapenem resist OXA 48 LIKE NOT DETECTED NOT DETECTED Final   Carbapenem resistance VIM NOT DETECTED NOT DETECTED Final    Comment: Performed at [facility], [address], [place], [place] [address]  Blood culture (routine x 2)     Status: Abnormal   Collection Time: 05/16/21 12:12 AM   Specimen: Right Antecubital; Blood  Result Value Ref Range Status   Specimen Description   Final    RIGHT ANTECUBITAL BOTTLES DRAWN AEROBIC ONLY Performed at [facility], [address] [place], [place] [address]    Special Requests   Final    Blood Culture adequate volume Performed at [facility], [address] [place], [place] [address]    Culture  Setup Time   Final    GRAM NEGATIVE RODS AEROBIC BOTTLE ONLY Gram Stain Report Called to,Read Back By and Verified With: DAWKINS N @ 1410 ON 05/16/21 BY HENDERSON L CRITICAL RESULT CALLED TO, READ BACK BY AND VERIFIED WITH: PHARMD JAMES LEDFORD 05/17/21@3 :28 BY TW Performed at [facility], [place] [address] [place], [place] [address]    Culture ESCHERICHIA COLI (A)  Final   Report Status 05/19/2021 FINAL  Final   Organism ID, Bacteria ESCHERICHIA COLI  Final      Susceptibility   Escherichia coli - MIC*    AMPICILLIN >=32 RESISTANT Resistant     CEFAZOLIN >=64 RESISTANT Resistant     CEFEPIME <=0.12 SENSITIVE Sensitive     CEFTAZIDIME <=1 SENSITIVE Sensitive     CEFTRIAXONE 0.5 SENSITIVE Sensitive     CIPROFLOXACIN <=0.25 SENSITIVE Sensitive     GENTAMICIN <=1 SENSITIVE Sensitive     IMIPENEM <=0.25 SENSITIVE Sensitive     TRIMETH/SULFA <=20 SENSITIVE Sensitive     AMPICILLIN/SULBACTAM >=32 RESISTANT Resistant     PIP/TAZO 8 SENSITIVE Sensitive     *  ESCHERICHIA COLI  Blood Culture ID Panel (Reflexed)     Status: Abnormal   Collection Time: 05/16/21 12:12 AM  Result Value Ref Range Status   Enterococcus faecalis NOT DETECTED NOT DETECTED Final    Enterococcus Faecium NOT DETECTED NOT DETECTED Final   Listeria monocytogenes NOT DETECTED NOT DETECTED Final   Staphylococcus species NOT DETECTED NOT DETECTED Final   Staphylococcus aureus (BCID) NOT DETECTED NOT DETECTED Final   Staphylococcus epidermidis NOT DETECTED NOT DETECTED Final   Staphylococcus lugdunensis NOT DETECTED NOT DETECTED Final   Streptococcus species NOT DETECTED NOT DETECTED Final   Streptococcus agalactiae NOT DETECTED NOT DETECTED Final   Streptococcus pneumoniae NOT DETECTED NOT DETECTED Final   Streptococcus pyogenes NOT DETECTED NOT DETECTED Final   A.calcoaceticus-baumannii NOT DETECTED NOT DETECTED Final   Bacteroides fragilis NOT DETECTED NOT DETECTED Final   Enterobacterales DETECTED (A) NOT DETECTED Final    Comment: Enterobacterales represent a large order of gram negative bacteria, not a single organism. CRITICAL RESULT CALLED TO, READ BACK BY AND VERIFIED WITH: PHARMD JAMES LEDFORD 05/17/21@3 :28 BY TW    Enterobacter cloacae complex NOT DETECTED NOT DETECTED Final   Escherichia coli DETECTED (A) NOT DETECTED Final    Comment: CRITICAL RESULT CALLED TO, READ BACK BY AND VERIFIED WITH: PHARMD JAMES LEDFORD 05/17/21@3 :28 BY TW    Klebsiella aerogenes NOT DETECTED NOT DETECTED Final   Klebsiella oxytoca NOT DETECTED NOT DETECTED Final   Klebsiella pneumoniae NOT DETECTED NOT DETECTED Final   Proteus species NOT DETECTED NOT DETECTED Final   Salmonella species NOT DETECTED NOT DETECTED Final   Serratia marcescens NOT DETECTED NOT DETECTED Final   Haemophilus influenzae NOT DETECTED NOT DETECTED Final   Neisseria meningitidis NOT DETECTED NOT DETECTED Final   Pseudomonas aeruginosa NOT DETECTED NOT DETECTED Final   Stenotrophomonas maltophilia NOT DETECTED NOT DETECTED Final   Candida albicans NOT DETECTED NOT DETECTED Final   Candida auris NOT DETECTED NOT DETECTED Final   Candida glabrata NOT DETECTED NOT DETECTED Final   Candida krusei NOT DETECTED  NOT DETECTED Final   Candida parapsilosis NOT DETECTED NOT DETECTED Final   Candida tropicalis NOT DETECTED NOT DETECTED Final   Cryptococcus neoformans/gattii NOT DETECTED NOT DETECTED Final   CTX-M ESBL NOT DETECTED NOT DETECTED Final   Carbapenem resistance IMP NOT DETECTED NOT DETECTED Final   Carbapenem resistance KPC NOT DETECTED NOT DETECTED Final   Carbapenem resistance NDM NOT DETECTED NOT DETECTED Final   Carbapenem resist OXA 48 LIKE NOT DETECTED NOT DETECTED Final   Carbapenem resistance VIM NOT DETECTED NOT DETECTED Final    Comment: Performed at [facility], [address] [place], [place] [address]  MRSA Next Gen by PCR, Nasal     Status: None   Collection Time: 05/16/21  2:43 AM   Specimen: Nasal Mucosa; Nasal Swab  Result Value Ref Range Status   MRSA by PCR Next Gen NOT DETECTED NOT DETECTED Final    Comment: (NOTE) The GeneXpert MRSA Assay (FDA approved for NASAL specimens only), is one component of a comprehensive MRSA colonization surveillance program. It is not intended to diagnose MRSA infection nor to guide or monitor treatment for MRSA infections. Test performance is not FDA approved in patients less than 44 years old. Performed at [facility], [place] [address] [place],  [address]      Serology:   Imaging: If present, new imagings (plain films, ct scans, and mri) have been personally visualized and interpreted; radiology reports  have been reviewed. Decision making incorporated into the Impression / Recommendations.  11/28 ct abd pelv/chest with contrast 1. No acute findings within the chest. No pulmonary embolism is seen, with mild study limitations detailed above. No evidence of pneumonia or pulmonary edema. 2. Metastatic lymphadenopathy is redemonstrated within the mediastinum, bilateral axillae, and supraclavicular LEFT neck. Also redemonstrated is the mass versus conglomerate lymphadenopathy in the axillary tail region of the  LEFT breast. These findings are stable compared to the recent chest CT of 04/09/2021. 3. Lytic-appearing lesion within the T6 vertebral body, and destructive/lytic changes within the sternum, suspicious for metastatic osseous disease. Consider nuclear medicine bone scan or PET scan for confirmation. 4. Numerous small hypodense lesions within the bilateral liver lobes, as also described on the earlier CT abdomen report of 12/12/2019, presumably liver metastases. 5. Cholelithiasis without evidence of acute cholecystitis. 6. Colonic diverticulosis without evidence of acute diverticulitis. 7. No acute findings within the abdomen or pelvis. No bowel obstruction or evidence of acute bowel wall inflammation. No free fluid or abscess collection. No evidence of acute solid organ abnormality. 8. No evidence of acute osseous fracture or dislocation is seen.   11/28 ct left hip and left knee without contrast 1. No evidence of acute fracture or dislocation at the LEFT hip. 2. 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 1. 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 No fracture is identified, although sensitivity for subtle fractures is reduced due to the degree of spurring and associated cortical irregularities. 2. Moderate knee effusion. 3. Atherosclerosis. 4. Subcutaneous edema anterior to the patella.     Jabier Mutton, [place] [facility] [place] [phone] pager    05/21/2021, 2:27 PM

## 2021-05-21 NOTE — Consult Note (Signed)
Reason for Consult:Left knee/hip pain Referring Physician: Bonnielee Haff Time called: [id number] Time at bedside: Crystal Vega is an 70 y.o. female.  HPI: Crystal Vega was admitted to the hospital about 5d ago with fatigue. She was incidentally found to have Covid as well at a UTI and e Coli bacteremia. She also has been having joint pain for 2+ months and medical service worried about septic joint in setting of bacteremia. She notes the knee/hip pain have been present since she fell several months ago but is unsure if the fall was the cause of the pain or how temporally related the two were. She notes fairly constant pain that is quite severe though it's better now than it was a week ago. She notes pain in multiple other joints as well that has been going on for close to a month. The pain is bad enough that she cannot ambulate. X-rays were only remarkable for OA.  Past Medical History:  Diagnosis Date   Anemia    Arthritis    per patient " left knee"   DVT (deep venous thrombosis) (HCC)    left leg   Dyspnea    Essential hypertension, benign    Family history of cancer of female genital organ    Family history of GI tract cancer    Metastatic breast cancer ([place])    left breast   Port-A-Cath in place 12/25/2019   Type 2 diabetes mellitus [facility])     Past Surgical History:  Procedure Laterality Date   BIOPSY  11/27/2020   Procedure: BIOPSY;  Surgeon: Daneil Dolin, MD;  [facility];  Service: Endoscopy;;   CATARACT EXTRACTION W/ INTRAOCULAR LENS IMPLANT Right    COLONOSCOPY WITH PROPOFOL N/A 11/27/2020   Procedure: COLONOSCOPY WITH PROPOFOL;  Surgeon: Daneil Dolin, MD;  Location[facility];  Service: Endoscopy;  Laterality: N/A;  9:00am   DILATION AND CURETTAGE OF UTERUS N/A 05/20/2021   Procedure: DILATATION AND CURETTAGE;  Surgeon: Mora Bellman, MD;  Location: [place];  Service: Gynecology;  Laterality: N/A;   HYSTEROSCOPY WITH D & C N/A 06/05/2014   Procedure:  DILATATION AND CURETTAGE /HYSTEROSCOPY;  Surgeon: Florian Buff, MD;  Location: [facility];  Service: Gynecology;  Laterality: N/A;   IR REMOVAL TUN ACCESS W/ PORT W/O FL MOD SED  05/20/2021   POLYPECTOMY N/A 06/05/2014   Procedure: ENDOMETRIAL POLYPECTOMY;  Surgeon: Florian Buff, MD;  Location: [facility];  Service: Gynecology;  Laterality: N/A;   PORTACATH PLACEMENT N/A 12/14/2019   Procedure: INSERTION PORT-A-CATH WITH ULTRASOUND GUIDANCE;  Surgeon: Donnie Mesa, MD;  Location: [place];  Service: General;  Laterality: N/A;   TUBAL LIGATION      Family History  Problem Relation Age of Onset   Diabetes Mellitus II Father    Congestive Heart Failure Father    Colon cancer Mother 65       patient not sure if colon vs stomach   Stroke Maternal Grandmother    Cancer Cousin        female reproductive cancer, dx. 50s/60s    Social History:  reports that she has quit smoking. Her smoking use included cigarettes. She has never used smokeless tobacco. She reports that she does not drink alcohol and does not use drugs.  Allergies: No Known Allergies  Medications: I have reviewed the patient's current medications.  Results for orders placed or performed during the hospital encounter of 05/15/21 (from the past 48 hour(s))  Glucose,  capillary     Status: Abnormal   Collection Time: 05/19/21  4:10 PM  Result Value Ref Range   Glucose-Capillary 168 (H) 70 - 99 mg/dL    Comment: Glucose reference range applies only to samples taken after fasting for at least 8 hours.  Glucose, capillary     Status: None   Collection Time: 05/19/21  8:56 PM  Result Value Ref Range   Glucose-Capillary 98 70 - 99 mg/dL    Comment: Glucose reference range applies only to samples taken after fasting for at least 8 hours.  Glucose, capillary     Status: Abnormal   Collection Time: 05/20/21 12:09 AM  Result Value Ref Range   Glucose-Capillary 147 (H) 70 - 99 mg/dL    Comment: Glucose reference range applies only to samples  taken after fasting for at least 8 hours.  Glucose, capillary     Status: Abnormal   Collection Time: 05/20/21  3:36 AM  Result Value Ref Range   Glucose-Capillary 113 (H) 70 - 99 mg/dL    Comment: Glucose reference range applies only to samples taken after fasting for at least 8 hours.  CBC     Status: Abnormal   Collection Time: 05/20/21  4:44 AM  Result Value Ref Range   WBC 7.2 4.0 - 10.5 K/uL   RBC 2.93 (L) 3.87 - 5.11 MIL/uL   Hemoglobin 8.5 (L) 12.0 - 15.0 g/dL   HCT 26.0 (L) 36.0 - 46.0 %   MCV 88.7 80.0 - 100.0 fL   MCH 29.0 26.0 - 34.0 pg   MCHC 32.7 30.0 - 36.0 g/dL   RDW 16.5 (H) 11.5 - 15.5 %   Platelets 126 (L) 150 - 400 K/uL    Comment: Immature Platelet Fraction may be clinically indicated, consider ordering this additional test [id number] REPEATED TO VERIFY    nRBC 0.0 0.0 - 0.2 %    Comment: Performed at [facility], [place] [address], [place], [place] [address]  Basic metabolic panel     Status: Abnormal   Collection Time: 05/20/21  4:44 AM  Result Value Ref Range   Sodium 135 135 - 145 mmol/L   Potassium 3.5 3.5 - 5.1 mmol/L   Chloride 104 98 - 111 mmol/L   CO2 26 22 - 32 mmol/L   Glucose, Bld 125 (H) 70 - 99 mg/dL    Comment: Glucose reference range applies only to samples taken after fasting for at least 8 hours.   BUN 12 8 - 23 mg/dL   Creatinine, Ser 0.77 0.44 - 1.00 mg/dL   Calcium 7.5 (L) 8.9 - 10.3 mg/dL   GFR, Estimated >60 >60 mL/min    Comment: (NOTE) Calculated using the CKD-EPI Creatinine Equation (2021)    Anion gap 5 5 - 15    Comment: Performed at [facility] [address] [place], [place] [address]  Glucose, capillary     Status: None   Collection Time: 05/20/21  9:50 AM  Result Value Ref Range   Glucose-Capillary 90 70 - 99 mg/dL    Comment: Glucose reference range applies only to samples taken after fasting for at least 8 hours.  Glucose, capillary     Status: Abnormal   Collection Time: 05/20/21  4:17 PM  Result  Value Ref Range   Glucose-Capillary 160 (H) 70 - 99 mg/dL    Comment: Glucose reference range applies only to samples taken after fasting for at least 8 hours.  Glucose, capillary  Status: Abnormal   Collection Time: 05/20/21  8:49 PM  Result Value Ref Range   Glucose-Capillary 103 (H) 70 - 99 mg/dL    Comment: Glucose reference range applies only to samples taken after fasting for at least 8 hours.  Glucose, capillary     Status: None   Collection Time: 05/21/21  5:31 AM  Result Value Ref Range   Glucose-Capillary 78 70 - 99 mg/dL    Comment: Glucose reference range applies only to samples taken after fasting for at least 8 hours.  CBC     Status: Abnormal   Collection Time: 05/21/21  6:32 AM  Result Value Ref Range   WBC 6.7 4.0 - 10.5 K/uL   RBC 3.35 (L) 3.87 - 5.11 MIL/uL   Hemoglobin 9.5 (L) 12.0 - 15.0 g/dL   HCT 30.1 (L) 36.0 - 46.0 %   MCV 89.9 80.0 - 100.0 fL   MCH 28.4 26.0 - 34.0 pg   MCHC 31.6 30.0 - 36.0 g/dL   RDW 16.5 (H) 11.5 - 15.5 %   Platelets 130 (L) 150 - 400 K/uL   nRBC 0.0 0.0 - 0.2 %    Comment: Performed at [facility] [address], [place], [place] [address]  Basic metabolic panel     Status: Abnormal   Collection Time: 05/21/21  6:32 AM  Result Value Ref Range   Sodium 133 (L) 135 - 145 mmol/L   Potassium 4.3 3.5 - 5.1 mmol/L    Comment: NO VISIBLE HEMOLYSIS   Chloride 102 98 - 111 mmol/L   CO2 25 22 - 32 mmol/L   Glucose, Bld 105 (H) 70 - 99 mg/dL    Comment: Glucose reference range applies only to samples taken after fasting for at least 8 hours.   BUN 12 8 - 23 mg/dL   Creatinine, Ser 0.59 0.44 - 1.00 mg/dL   Calcium 7.6 (L) 8.9 - 10.3 mg/dL   GFR, Estimated >60 >60 mL/min    Comment: (NOTE) Calculated using the CKD-EPI Creatinine Equation (2021)    Anion gap 6 5 - 15    Comment: Performed at [facility] [address] [place], [place] [address]  Magnesium     Status: Abnormal   Collection Time: 05/21/21  6:32 AM   Result Value Ref Range   Magnesium 1.2 (L) 1.7 - 2.4 mg/dL    Comment: Performed at [facility] [address], [place], [place] [address]  Glucose, capillary     Status: None   Collection Time: 05/21/21  8:17 AM  Result Value Ref Range   Glucose-Capillary 82 70 - 99 mg/dL    Comment: Glucose reference range applies only to samples taken after fasting for at least 8 hours.  Glucose, capillary     Status: Abnormal   Collection Time: 05/21/21  9:44 AM  Result Value Ref Range   Glucose-Capillary 128 (H) 70 - 99 mg/dL    Comment: Glucose reference range applies only to samples taken after fasting for at least 8 hours.  Glucose, capillary     Status: Abnormal   Collection Time: 05/21/21 12:40 PM  Result Value Ref Range   Glucose-Capillary 103 (H) 70 - 99 mg/dL    Comment: Glucose reference range applies only to samples taken after fasting for at least 8 hours.    IR REMOVAL TUN ACCESS W/ PORT W/O FL MOD SED  Result Date: 05/20/2021 CLINICAL DATA:  70 year old female with history of breast  cancer and recent bacteremia. Port removal requested. EXAM: REMOVAL OF IMPLANTED TUNNELED PORT-A-CATH MEDICATIONS: None. ANESTHESIA/SEDATION: Moderate (conscious) sedation was employed during this procedure. A total of Versed 2 mg and Fentanyl 50 mcg was administered intravenously. Moderate Sedation Time: 20 minutes. The patient's level of consciousness and vital signs were monitored continuously by radiology nursing throughout the procedure under my direct supervision. FLUOROSCOPY TIME:  None PROCEDURE: Informed written consent was obtained from the patient after a discussion of the risk, benefits and alternatives to the procedure. The patient was positioned supine on the fluoroscopy table and the right chest Port-A-Cath site was prepped with chlorhexidine. A sterile gown and gloves were worn during the procedure. Local anesthesia was provided with 1% lidocaine with epinephrine. A timeout was performed  prior to the initiation of the procedure. An incision was made overlying the Port-A-Cath with a #15 scalpel. Utilizing sharp and blunt dissection, the Port-A-Cath was removed completely. The pocked was irrigated with sterile saline. Wound closure was performed with deep dermal 2-0 and Dermabond. A dressing was placed. The patient tolerated the procedure well without immediate post procedural complication. FINDINGS: Successful removal of implant Port-A-Cath without immediate post procedural complication. IMPRESSION: Successful removal of implanted Port-A-Cath. Ruthann Cancer, MD [facility] Electronically Signed   By: Ruthann Cancer M.D.   On: 05/20/2021 13:56   DG Knee Left Port  Result Date: 05/21/2021 CLINICAL DATA:  Swollen knee EXAM: PORTABLE LEFT KNEE - 1-2 VIEW COMPARISON:  None. FINDINGS: Patient not able to fully cooperate with positioning. Suboptimal projections. 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 No acute fracture or mass lesion.  No joint effusion IMPRESSION: Tricompartmental degenerative change.  No acute abnormality. Electronically Signed   By: Franchot Gallo M.D.   On: 05/21/2021 11:12    Review of Systems  Constitutional:  Negative for chills, diaphoresis and fever.  HENT:  Negative for ear discharge, ear pain, hearing loss and tinnitus.   Eyes:  Negative for photophobia and pain.  Respiratory:  Negative for cough and shortness of breath.   Cardiovascular:  Negative for chest pain.  Gastrointestinal:  Negative for abdominal pain, nausea and vomiting.  Genitourinary:  Negative for dysuria, flank pain, frequency and urgency.  Musculoskeletal:  Positive for arthralgias (All joints, but esp left knee > left hip). Negative for back pain, myalgias and neck pain.  Neurological:  Negative for dizziness and headaches.  Hematological:  Does not  bruise/bleed easily.  Psychiatric/Behavioral:  The patient is not nervous/anxious.   Blood pressure 139/66, pulse 89, temperature 97.8 F (36.6 C), temperature source Oral, resp. rate 17, height 5\' 3"  (1.6 m), weight 120.8 kg, SpO2 94 %. Physical Exam Constitutional:      General: She is not in acute distress.    Appearance: She is well-developed. She is not diaphoretic.  HENT:     Head: Normocephalic and atraumatic.  Eyes:     General: No scleral icterus.       Right eye: No discharge.        Left eye: No discharge.     Conjunctiva/sclera: Conjunctivae normal.  Cardiovascular:     Rate and Rhythm: Normal rate and regular rhythm.  Pulmonary:     Effort: Pulmonary effort is normal. No respiratory distress.  Musculoskeletal:     Cervical back: Normal range of motion.     Comments: LLE No traumatic wounds, ecchymosis, or rash  Severe TTP knee, would let  me range passively about 15 degrees, no sig TTP AP/lat comp pelvis  No obvious knee or ankle effusion but body habitus makes determination difficult  Sens DPN, SPN, TN intact  Motor EHL, ext, flex, evers 5/5  DP 2+, PT 0, No significant edema  Skin:    General: Skin is warm and dry.  Neurological:     Mental Status: She is alert.  Psychiatric:        Mood and Affect: Mood normal.        Behavior: Behavior normal.    Assessment/Plan: Left knee and hip pain -- Given history I do not think this likely to represent septic arthritis. Moreover I don't think she'd tolerate bedside arthrocentesis given technical difficulty related to her size and willingness to undergo procedure. I think she needs workup for polyarthralgia and will leave that to her medical team. Dr. Lucia Gaskins to evaluate as well.    Lisette Abu, PA-C Orthopedic Surgery [phone] 05/21/2021, 3:45 PM

## 2021-05-21 NOTE — TOC Initial Note (Signed)
Transition of Care [facility]) - Initial/Assessment Note    Patient Details  Name: Crystal Vega MRN: [id number] Date of Birth: Apr 10, 1951  Transition of Care [facility]) CM/SW Contact:    Joanne Chars, [place] Phone Number: 05/21/2021, 4:02 PM  Clinical Narrative:    CSW spoke with pt from distance, pt still in covid isolation and cannot locate her phone.  Permission given to speak with husband Herbie Baltimore.  CSW spoke with Herbie Baltimore by phone, he confirmed that pt was doing short term rehab at [facility] prior to being readmitted.  Pt insurance auth had run out but they had won an appeal to get more time.  We discussed PT recommendation for SNF again and he does want pt to return to [facility].  CSW LM with Debbie at [facility] asking about pt return.  There is waiver on auth for [employer] at this time.                Expected Discharge Plan: Skilled Nursing Facility Barriers to Discharge: Continued Medical Work up   Patient Goals and CMS Choice   CMS Medicare.gov Compare Post Acute Care list provided to::  (pt would like to return to [facility])    Expected Discharge Plan and Services Expected Discharge Plan: [place] Choice: [place] arrangements for the past 2 months: Single Family Home                                      Prior Living Arrangements/Services Living arrangements for the past 2 months: Single Family Home Lives with:: Spouse Patient language and need for interpreter reviewed:: Yes        Need for Family Participation in Patient Care: No (Comment) Care giver support system in place?: Yes (comment) Current home services: Other (comment) (none) Criminal Activity/Legal Involvement Pertinent to Current Situation/Hospitalization: No - Comment as needed  Activities of Daily Living Home Assistive Devices/Equipment: Cane (specify quad or straight) ADL Screening (condition at time of admission) Patient's  cognitive ability adequate to safely complete daily activities?: Yes Is the patient deaf or have difficulty hearing?: No Does the patient have difficulty seeing, even when wearing glasses/contacts?: No Does the patient have difficulty concentrating, remembering, or making decisions?: No Patient able to express need for assistance with ADLs?: Yes Does the patient have difficulty dressing or bathing?: Yes Independently performs ADLs?: No Communication: Independent Dressing (OT): Needs assistance Is this a change from baseline?: Pre-admission baseline Grooming: Needs assistance Is this a change from baseline?: Pre-admission baseline Feeding: Needs assistance Is this a change from baseline?: Pre-admission baseline Bathing: Dependent Is this a change from baseline?: Pre-admission baseline Toileting: Dependent Is this a change from baseline?: Pre-admission baseline In/Out Bed: Dependent Is this a change from baseline?: Pre-admission baseline Walks in Home: Dependent Is this a change from baseline?: Pre-admission baseline Does the patient have difficulty walking or climbing stairs?: Yes Weakness of Legs: Both Weakness of Arms/Hands: Left  Permission Sought/Granted Permission sought to share information with : Family Supports Permission granted to share information with : Yes, Verbal Permission Granted  Share Information with NAME: husband Herbie Baltimore           Emotional Assessment Appearance:: Appears stated age Attitude/Demeanor/Rapport: Engaged Affect (typically observed): Appropriate Orientation: : Oriented to Self, Oriented to Place, Oriented to  Time, Oriented to Situation Alcohol / Substance Use: Not Applicable Psych  Involvement: No (comment)  Admission diagnosis:  Vaginal bleeding [N93.9] Circulatory failure ([place]) [R57.9] Gastrointestinal hemorrhage with melena [K92.1] Symptomatic anemia [D64.9] Hypovolemic shock (HCC) [R57.1] Patient Active Problem List   Diagnosis Date  Noted   Hip pain    Acute pain of left knee    Severe sepsis with septic shock ([place])    Postmenopausal vaginal bleeding    Hypovolemic shock ([place]) 05/16/2021   COVID-19    Symptomatic anemia    Vaginal bleeding    AKI (acute kidney injury) ([place])    Hyperglycemia due to diabetes mellitus ([place])    Gastrointestinal hemorrhage with melena    Circulatory failure ([place]) 05/15/2021   E coli bacteremia 05/04/2021   Pressure injury of skin 04/28/2021   Proteus Mirabilis and Klebsiella UTI (urinary tract infection) 04/28/2021   Fall 04/27/2021   Tachycardia 04/27/2021   Thrombocytopenia ([place]) 04/27/2021   Chronic anemia 04/27/2021   Drug-induced neutropenia (HCC) 02/03/2021   Iron deficiency anemia 02/03/2021   Nausea without vomiting 11/12/2020   Rectal bleeding 08/06/2020   Left leg DVT ([place]) 03/03/2020   Family history of GI tract cancer    Family history of cancer of female genital organ    Port-A-Cath in place 12/25/2019   Colonic mass 12/19/2019   Goals of care, counseling/discussion 12/17/2019   Malignant neoplasm of overlapping sites of left breast in female, estrogen receptor positive ([place]) 11/28/2019   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   Heme positive stool 02/27/2018   FH: colon cancer 02/27/2018   Constipation 02/27/2018   Simple endometrial 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 04/16/2014   Atypical chest pain 10/10/2013   Essential hypertension, benign 10/10/2013   Morbid obesity ([place]) 10/10/2013   Type 2 diabetes mellitus ([place]) 10/10/2013   PCP:  Lemmie Evens, MD Pharmacy:   [id number] - [place], [place] [address] [place] [address] Phone: [phone] Fax: [phone]     Social Determinants of Health (SDOH) Interventions    Readmission Risk Interventions Readmission Risk Prevention Plan 04/28/2021  Transportation Screening Complete  Home Care Screening Complete  Medication  Review (RN CM) Complete  Some recent data might be hidden

## 2021-05-21 NOTE — Care Management Important Message (Signed)
Important Message  Patient Details  Name: LEGEND PECORE MRN: [id number] Date of Birth: 1951-02-02   Medicare Important Message Given:  Yes  12/22 IM Letter mailed to pt's address due to isolation status   Hannah Beat 05/21/2021, 2:41 PM

## 2021-05-21 NOTE — Progress Notes (Signed)
OT Cancellation Note  Patient Details Name: Crystal Vega MRN: [id number] DOB: 1950/06/08   Cancelled Treatment:    Reason Eval/Treat Not Completed: Fatigue/lethargy limiting ability to participate;Other (comment) (pt sleeping soundly). OT will follow up next available time  Britt Bottom 05/21/2021, 3:19 PM

## 2021-05-21 NOTE — Consult Note (Signed)
Palliative Care Consult Note                                  Date: 05/21/2021   Patient Name: Crystal Vega  DOB: Jul 13, 1950  MRN: [id number]  Age / Sex: 70 y.o., female  PCP: Lemmie Evens, MD Referring Physician: Bonnielee Haff, MD  Reason for Consultation: Establishing goals of care  HPI/Patient Profile: 70 y.o. female  with past medical history of breast cancer undergoing chemo, colonic adenocarcinoma, iron deficiency anemia, DVT on Eliquis presents to [facility] ED on 12/16 with acute blood loss anemia.  Patient has been feeling more fatigue and report vaginal bleeding for about 2 weeks.  She denies frank red bloody stool but does have a history of colon adenocarcinoma and has occasional bloody stool.  Recent transvaginal ultrasound showed thickened endometrium up to 19 mm with plans to receive endometrial sampling to rule out carcinoma.   Patient was admitted with hypotension, systolic blood pressure in the 60.  Sodium 125 creatinine 1.8, hemoglobin of 5, platelets 97 COVID-positive.  OB/gynecology and GI consulted.  Patient received blood transfusion, started on Megace, she was also started on IV pressors Levophed.  She was admitted by the ICU team.   Patient stabilized, transferred to [facility] 12/19.   Admitted hemorrhagic and septic shock. Received Blood transfusion.  Patient was seen by gynecology.  Underwent D&C on 12/21.  Port-A-Cath was removed on 12/21.  PMT was consulted for [place] conversations.  Past Medical History:  Diagnosis Date   Anemia    Arthritis    per patient " left knee"   DVT (deep venous thrombosis) (HCC)    left leg   Dyspnea    Essential hypertension, benign    Family history of cancer of female genital organ    Family history of GI tract cancer    Metastatic breast cancer ([place])    left breast   Port-A-Cath in place 12/25/2019   Type 2 diabetes mellitus (HCC)     Subjective:   This NP Walden Field  reviewed medical records, received report from team, assessed the patient and then meet at the patient's bedside to discuss diagnosis, prognosis, GOC, EOL wishes disposition and options.  I met with the patient at the bedside.   Concept of Palliative Care was introduced as specialized medical care for people and their families living with serious illness.  If focuses on providing relief from the symptoms and stress of a serious illness.  The goal is to improve quality of life for both the patient and the family. Values and goals of care important to patient and family were attempted to be elicited.  Created space and opportunity for patient  and family to explore thoughts and feelings regarding current medical situation   Natural trajectory and current clinical status were discussed. Questions and concerns addressed. Patient  encouraged to call with questions or concerns.    Patient/Family Understanding of Illness: She knows her cancer has no cure and it is spreading.  She knows there is a lot of things going on and she seems frustrated and overwhelmed that everyone is wanting to do  things for her all at once.  She feels that she is being asked to make multiple decisions and participate in multiple therapies.  She expresses gratitude that people are trying to take care of her, but states that she just needs "time to think".  She states that she was doing okay when she was getting chemo and feeling better.  However, chemo is on hold currently.  Right now she is trying to "just move".  She is asking to be given a break.  She feels overwhelmed.  Life Review: The patient has a spouse Herbie [place] and 2 grown daughters Lynelle Smoke and [name].  She also has 2 grandchildren.  Patient Values: Faith, family  Goals: Unsure  Today's Discussion: I gently reviewed that she does have cancer that appears to be advanced.  I discussed that there are multiple options that can be done, but there is also the option to focus  on comfort.  Given how overwhelmed she is I did not press further for decision making today.  She states that sometimes she cries and feels like giving up.  However, crying does not help relieve her stress or anxiety anymore.  She has asked God previously to let her live long enough to raise her children, and he did which she expresses gratitude for.  She feels that she is having to fight to support her husband to be able to support her through this, support her children.  She feels all alone at times being that her daughters work.  Also, family is not able to visit because her husband has COPD and she does not want him to get COVID-19.  She also does not want her daughters to get COVID-19.  She seems isolated, overwhelmed, and lost.  She did express a strong faith in God.  I offered spiritual support through the chaplain program and she has accepted.  I provided emotional general support therapeutic listening, empathy, sharing of stories, and other techniques.  I answered all questions and addressed all concerns to the best of my ability.  Review of Systems  Constitutional:  Positive for fatigue.  Gastrointestinal:  Negative for abdominal pain, nausea and vomiting.  Musculoskeletal:  Positive for arthralgias (left hip pain (chronic)).  Psychiatric/Behavioral:         Overwhelmed, "tired"   Objective:   Primary Diagnoses: Present on Admission:  Circulatory failure ([place])  Hypovolemic shock ([place])   Physical Exam Vitals and nursing note reviewed.  Constitutional:      General: She is not in acute distress.    Appearance: She is obese. She is ill-appearing.  HENT:     Head: Normocephalic and atraumatic.  Cardiovascular:     Rate and Rhythm: Normal rate.  Pulmonary:     Effort: No respiratory distress.     Breath sounds: No wheezing or rhonchi.  Abdominal:     General: Abdomen is flat.     Palpations: Abdomen is soft.  Skin:    General: Skin is warm and dry.  Neurological:      Mental Status: She is alert.    Vital Signs:  BP 139/66 (BP Location: Right Leg)    Pulse 89    Temp 97.8 F (36.6 C) (Oral)    Resp 17    Ht 5' 3" (1.6 m)    Wt 120.8 kg    SpO2 94%    BMI 47.18 kg/m   Palliative Assessment/Data: 40-50%    Advanced Care Planning:   Primary Decision Maker: PATIENT  Code  Status/Advance Care Planning: DNR  A discussion was had today regarding advanced directives. Brought up the need to begin discussions on "what ifs" and "goals, things important to her". Did not make and concrete decisions today given how overwhelmed she is.  Decisions/Changes to ACP: None today  Assessment & Plan:   Impression: 70 year old female with metastatic breast cancer and now with concerns for possible endometrial cancer status post D&C with postmenopausal vaginal bleeding and endometrial thickening.  She seems to be in a difficult situation.  Because of sepsis her port was removed.  Pending TEE, possible MRI although she feels overwhelmed by all of these requests.  Long-term prognosis does not seem good.  However, given how overwhelmed she is we have elected to hold off on definite decision making today.  We will continue to support the patient as best as we can and continue gentle goals of care conversations.  SUMMARY OF RECOMMENDATIONS   Remain DNR Emotional, spiritual support Continue gentle goals of care discussions PMT will continue to follow  Symptom Management:  Per primary team PMT is available to assist as needed  Prognosis:  Unable to determine  Discharge Planning:  To Be Determined   Discussed with: Medical team, nursing team, patient    Thank you for allowing [place] to participate in the care of Crystal Vega PMT will continue to support holistically.  Time Total: 70 min  Greater than 50%  of this time was spent counseling and coordinating care related to the above assessment and plan.  Signed by: Walden Field, NP Palliative Medicine  Team  Team Phone # [phone] (Nights/Weekends)  05/21/2021, 2:54 PM

## 2021-05-21 NOTE — Progress Notes (Signed)
° ° °  [facility] has been requested to perform a transesophageal echocardiogram on Yunique G Joslyn for bacteremia, hs of breast cancer undergoing chemo, colonic adenocarcinoma, DVT on eliquis, COVID + .  After careful review of history and examination, the risks and benefits of transesophageal echocardiogram have been explained including risks of esophageal damage, perforation (1:10,000 risk), bleeding, pharyngeal hematoma as well as other potential complications associated with conscious sedation including aspiration, arrhythmia, respiratory failure and death. Alternatives to treatment were discussed, questions were answered. Patient wants to wait until morning to decide.  I will make NPO after midnight .   Cecilie Kicks, NP  05/21/2021 2:27 PM

## 2021-05-21 NOTE — Progress Notes (Signed)
PROGRESS NOTE    Crystal Vega  CWC:[id number] DOB: 11-12-1950 DOA: 05/15/2021 PCP: Lemmie Evens, MD   Brief Narrative: 70 year old with past medical history significant for breast cancer undergoing chemo, colonic adenocarcinoma, iron deficiency anemia, DVT on Eliquis presents to [facility] ED on 12/16 with acute blood loss anemia.  Patient has been feeling more fatigue and report vaginal bleeding for about 2 weeks.  She denies frank red bloody stool but does have a history of colon adenocarcinoma and has occasional bloody stool.  Recent transvaginal ultrasound showed thickened endometrium up to 19 mm with plans to receive endometrial sampling to rule out carcinoma.  Patient was admitted with hypotension, systolic blood pressure in the 60.  Sodium 125 creatinine 1.8, hemoglobin of 5, platelets 97 COVID-positive.  OB/gynecology and GI consulted.  Patient received blood transfusion, started on Megace, she was also started on IV pressors Levophed.  She was admitted by the ICU team.  Patient stabilized, transferred to [facility] 12/19.  Admitted hemorrhagic and septic shock. Received Blood transfusion.  Patient was seen by gynecology.  Underwent D&C on 12/21.  Port-A-Cath was removed on 12/21.      Assessment & Plan:   Hypovolemic Shock, secondary to Vaginal Bleeding. Acute Blood loss anemia;  Transvaginal US 09/24/2020; 19 mm thick endometrium.  Eliquis was placed on hold.  Patient was seen by OB/GYN. Underwent D&C on 12/21. Remains on Megace.  Does not appear to have significant vaginal bleeding at this time. Hemoglobin stable.  Continue to monitor. She did receive 2 units of PRBC on 12/19.  Sinus tachycardia  Likely multifactorial in setting of acute blood loss and sepsis.  Noted to be on beta-blocker at home which was held at the time of admission.  Could have been experiencing some rebound tachycardia.  Metoprolol was resumed yesterday.  Improvement in heart rate noted.   Septic Shock,  due to E coli Bacteremia;  Blood culture positive for E coli 05/16/2021 ID consulted. They recommend oncology input in regards to [facility]  Case was discussed with patient's oncologist, Dr. Delton Coombes.  He was okay with removal of port.  Was removed on 12/21. TEE is planned for 12/22.   Patient is complaining of left hip pain.  Plain films did not show any acute findings.  MRI was ordered by ID however patient could not tolerate it yesterday. Remains on ceftriaxone.  Left knee pain and swelling Appears to have swollen left knee joint.  This is been a longstanding issue per patient but appears to have worsened in the last couple weeks.  She did have a CT scan of her left knee in end of November which did show moderate joint effusion.  She has significantly restricted range of motion of the left knee.  We will repeat plain films.  She might benefit from arthrocentesis for symptom relief.  Acute kidney injury Seems to have resolved with IV fluids.  Monitor urine output.    Hyponatremia Sodium levels noted to be stable for the most part.  Continue to monitor every so often.  Hypomagnesemia Magnesium remains low at 1.2.  We will aggressively replete.  Incidental Covid:  Noted to be on oxygen saturating in the late 90s.  Patient does mention cough.  Does not have any fever.  Patient was given Remdesivir for 3 days.   Respiratory status is stable.  She is currently saturating normal on room air.  Hx of DVT Was on Eliquis which is currently on hold due to vaginal bleeding.  Resume  when cleared to do so by GYN.  Hypertension/hyperlipidemia Metoprolol was resumed.  ARB is on hold.  Continue to monitor.    Thrombocytopenia Stable platelet counts.  History of breast and colon cancer Followed by Dr. Delton Coombes with hematology/oncology.  Diabetes mellitus type 2, controlled Monitor CBGs.  SSI.  Goals of care Palliative care has been consulted.  Morbid obesity Estimated body mass index is  47.18 kg/m as calculated from the following:   Height as of this encounter: 5\' 3"  (1.6 m).   Weight as of this encounter: 120.8 kg.   DVT prophylaxis: SCD Code Status: DNR Family Communication: No family at bedside.  Discussed with patient Disposition: Unclear for now  Status is: Inpatient  Remains inpatient appropriate because: admitted with vaginal bleeding, hypovolemic shock. Still unstable tachycardic.         Consultants:  GYN CCM admitted patient.  ID   Procedures:  none  Antimicrobials:    Subjective: Patient noted to be more awake and alert this morning compared to yesterday.  Complains of pain in the left knee area along with generalized body aches.  Denies any shortness of breath cough or chest pain.    Objective: Vitals:   05/20/21 2058 05/21/21 0538 05/21/21 0700 05/21/21 0947  BP: 108/71 (!) 121/91 115/64 121/80  Pulse: 96 95 77   Resp: (!) 21 18 16    Temp:  98.5 F (36.9 C) 97.8 F (36.6 C) 98 F (36.7 C)  TempSrc:  Oral Oral Oral  SpO2: 98% 100% 100% 100%  Weight:      Height:        Intake/Output Summary (Last 24 hours) at 05/21/2021 1001 Last data filed at 05/21/2021 0540 Gross per 24 hour  Intake 460.19 ml  Output 2150 ml  Net -1689.81 ml    Filed Weights   05/15/21 1601 05/16/21 0244  Weight: 114.3 kg 120.8 kg    Examination:  General appearance: Awake alert.  In no distress Resp: Clear to auscultation bilaterally.  Normal effort Cardio: S1-S2 is normal regular.  No S3-S4.  No rubs murmurs or bruit GI: Abdomen is soft.  Nontender nondistended.  Bowel sounds are present normal.  No masses organomegaly Extremities: Edema noted bilateral lower extremities.  Left knee is noted to be much more swollen compared to the right.  Significantly restricted range of motion of the left knee without tenderness. Neurologic: No focal neurological deficits.     Data Reviewed: I have personally reviewed following labs and imaging  studies  CBC: Recent Labs  Lab 05/15/21 1658 05/16/21 0013 05/16/21 1949 05/18/21 0159 05/18/21 0745 05/19/21 0503 05/20/21 0444 05/21/21 0632  WBC 5.9 8.3   < > 8.1 6.5 6.9 7.2 6.7  NEUTROABS 3.7 6.4  --   --   --   --   --   --   HGB 5.0* 9.3*   < > 7.5* 7.4* 8.8* 8.5* 9.5*  HCT 16.5* 28.9*   < > 23.2* 23.4* 27.3* 26.0* 30.1*  MCV 94.3 92.0   < > 86.9 89.3 88.3 88.7 89.9  PLT 97* 103*   101*   < > 102* 96* 121* 126* 130*   < > = values in this interval not displayed.    Basic Metabolic Panel: Recent Labs  Lab 05/16/21 1948 05/17/21 0441 05/18/21 0159 05/18/21 0745 05/19/21 0503 05/20/21 0444 05/21/21 0632  NA 133* 135 133*  --  135 135 133*  K 4.0 3.9 4.1  --  3.6 3.5 4.3  CL  101 102 102  --  104 104 102  CO2 24 27 22   --  26 26 25   GLUCOSE 129* 98 92  --  109* 125* 105*  BUN 33* 23 16  --  11 12 12   CREATININE 0.77 0.67 0.64  --  0.62 0.77 0.59  CALCIUM 7.9* 8.0* 7.7*  --  7.4* 7.5* 7.6*  MG 1.5*  --   --  0.9* 1.6*  --  1.2*    GFR: Estimated Creatinine Clearance: 83.6 mL/min (by C-G formula based on SCr of 0.59 mg/dL). Liver Function Tests: Recent Labs  Lab 05/15/21 1659 05/16/21 1948  AST 28 29  ALT 12 14  ALKPHOS 119 117  BILITOT 0.1* 0.8  PROT 5.9* 5.9*  ALBUMIN 1.8* 1.7*    Coagulation Profile: Recent Labs  Lab 05/15/21 1658 05/16/21 0013 05/17/21 1420  INR 2.0* 1.9* 1.3*    CBG: Recent Labs  Lab 05/20/21 1617 05/20/21 2049 05/21/21 0531 05/21/21 0817 05/21/21 0944  GLUCAP 160* 103* 78 82 128*    Anemia Panel: Recent Labs    05/18/21 1145  FOLATE 17.8  RETICCTPCT 2.4    Sepsis Labs: Recent Labs  Lab 05/15/21 2053  LATICACIDVEN 1.3     Recent Results (from the past 240 hour(s))  Resp Panel by RT-PCR (Flu A&B, Covid) Nasopharyngeal Swab     Status: Abnormal   Collection Time: 05/15/21  6:50 PM   Specimen: Nasopharyngeal Swab; Nasopharyngeal(NP) swabs in vial transport medium  Result Value Ref Range Status   SARS  Coronavirus 2 by RT PCR POSITIVE (A) NEGATIVE Final    Comment: RESULT CALLED TO, READ BACK BY AND VERIFIED WITH: SAPPELT,J ON 05/15/21 AT 1950 BY LOY,C (NOTE) SARS-CoV-2 target nucleic acids are DETECTED.  The SARS-CoV-2 RNA is generally detectable in upper respiratory specimens during the acute phase of infection. Positive results are indicative of the presence of the identified virus, but do not rule out bacterial infection or co-infection with other pathogens not detected by the test. Clinical correlation with patient history and other diagnostic information is necessary to determine patient infection status. The expected result is Negative.  Fact Sheet for Patients: EntrepreneurPulse.com.au  Fact Sheet for Healthcare Providers: IncredibleEmployment.be  This test is not yet approved or cleared by the [place] FDA and  has been authorized for detection and/or diagnosis of SARS-CoV-2 by FDA under an Emergency Use Authorization (EUA).  This EUA will remain in effect (meaning this test c an be used) for the duration of  the COVID-19 declaration under Section 564(b)(1) of the Act, 21 U.S.C. section 360bbb-3(b)(1), unless the authorization is terminated or revoked sooner.     Influenza A by PCR NEGATIVE NEGATIVE Final   Influenza B by PCR NEGATIVE NEGATIVE Final    Comment: (NOTE) The Xpert Xpress SARS-CoV-2/FLU/RSV plus assay is intended as an aid in the diagnosis of influenza from Nasopharyngeal swab specimens and should not be used as a sole basis for treatment. Nasal washings and aspirates are unacceptable for Xpert Xpress SARS-CoV-2/FLU/RSV testing.  Fact Sheet for Patients: EntrepreneurPulse.com.au  Fact Sheet for Healthcare Providers: IncredibleEmployment.be  This test is not yet approved or cleared by the [place] FDA and has been authorized for detection and/or diagnosis of SARS-CoV-2  by FDA under an Emergency Use Authorization (EUA). This EUA will remain in effect (meaning this test can be used) for the duration of the COVID-19 declaration under Section 564(b)(1) of the Act, 21 U.S.C. section 360bbb-3(b)(1), unless the authorization is terminated  or revoked.  Performed at [facility], [address] [place], [place] [address]   Blood culture (routine x 2)     Status: Abnormal   Collection Time: 05/15/21  8:53 PM   Specimen: BLOOD  Result Value Ref Range Status   Specimen Description   Final    BLOOD RIGHT ANTECUBITAL Performed at [facility], [address], [place], [place] [address]    Special Requests   Final    BOTTLES DRAWN AEROBIC AND ANAEROBIC Blood Culture adequate volume Performed at [facility], [address] [place], [place] [address]    Culture  Setup Time   Final    GRAM NEGATIVE RODS ANAEROBIC BOTTLE ONLY aerobic bottle GNR Gram Stain Report Called to,Read Back By and Verified With: DAWKINS @ 1020 ON [id number] BY HENDERSON L CRITICAL RESULT CALLED TO, READ BACK BY AND VERIFIED WITH: G,COFFEE PHARMD @1328  05/16/21 EB IN BOTH AEROBIC AND ANAEROBIC BOTTLES Performed at [facility], [place] [address], [place],  [address]    Culture ESCHERICHIA COLI (A)  Final   Report Status 05/18/2021 FINAL  Final   Organism ID, Bacteria ESCHERICHIA COLI  Final      Susceptibility   Escherichia coli - MIC*    AMPICILLIN >=32 RESISTANT Resistant     CEFAZOLIN >=64 RESISTANT Resistant     CEFEPIME <=0.12 SENSITIVE Sensitive     CEFTAZIDIME <=1 SENSITIVE Sensitive     CEFTRIAXONE 1 SENSITIVE Sensitive     CIPROFLOXACIN <=0.25 SENSITIVE Sensitive     GENTAMICIN <=1 SENSITIVE Sensitive     IMIPENEM <=0.25 SENSITIVE Sensitive     TRIMETH/SULFA <=20 SENSITIVE Sensitive     AMPICILLIN/SULBACTAM >=32 RESISTANT Resistant     * ESCHERICHIA COLI  Blood Culture ID Panel (Reflexed)     Status: Abnormal   Collection Time: 05/15/21  8:53 PM  Result Value Ref  Range Status   Enterococcus faecalis NOT DETECTED NOT DETECTED Final   Enterococcus Faecium NOT DETECTED NOT DETECTED Final   Listeria monocytogenes NOT DETECTED NOT DETECTED Final   Staphylococcus species NOT DETECTED NOT DETECTED Final   Staphylococcus aureus (BCID) NOT DETECTED NOT DETECTED Final   Staphylococcus epidermidis NOT DETECTED NOT DETECTED Final   Staphylococcus lugdunensis NOT DETECTED NOT DETECTED Final   Streptococcus species NOT DETECTED NOT DETECTED Final   Streptococcus agalactiae NOT DETECTED NOT DETECTED Final   Streptococcus pneumoniae NOT DETECTED NOT DETECTED Final   Streptococcus pyogenes NOT DETECTED NOT DETECTED Final   A.calcoaceticus-baumannii NOT DETECTED NOT DETECTED Final   Bacteroides fragilis NOT DETECTED NOT DETECTED Final   Enterobacterales DETECTED (A) NOT DETECTED Final    Comment: Enterobacterales represent a large order of gram negative bacteria, not a single organism. CRITICAL RESULT CALLED TO, READ BACK BY AND VERIFIED WITH: G,COFFEE PHARMD @1328  05/16/21 EB    Enterobacter cloacae complex NOT DETECTED NOT DETECTED Final   Escherichia coli DETECTED (A) NOT DETECTED Final    Comment: CRITICAL RESULT CALLED TO, READ BACK BY AND VERIFIED WITH: G,COFFEE PHARMD @1328  05/16/21 EB    Klebsiella aerogenes NOT DETECTED NOT DETECTED Final   Klebsiella oxytoca NOT DETECTED NOT DETECTED Final   Klebsiella pneumoniae NOT DETECTED NOT DETECTED Final   Proteus species NOT DETECTED NOT DETECTED Final   Salmonella species NOT DETECTED NOT DETECTED Final   Serratia marcescens NOT DETECTED NOT DETECTED Final   Haemophilus influenzae NOT DETECTED NOT DETECTED Final   Neisseria meningitidis NOT DETECTED NOT DETECTED Final   Pseudomonas aeruginosa NOT DETECTED  NOT DETECTED Final   Stenotrophomonas maltophilia NOT DETECTED NOT DETECTED Final   Candida albicans NOT DETECTED NOT DETECTED Final   Candida auris NOT DETECTED NOT DETECTED Final   Candida glabrata NOT  DETECTED NOT DETECTED Final   Candida krusei NOT DETECTED NOT DETECTED Final   Candida parapsilosis NOT DETECTED NOT DETECTED Final   Candida tropicalis NOT DETECTED NOT DETECTED Final   Cryptococcus neoformans/gattii NOT DETECTED NOT DETECTED Final   CTX-M ESBL NOT DETECTED NOT DETECTED Final   Carbapenem resistance IMP NOT DETECTED NOT DETECTED Final   Carbapenem resistance KPC NOT DETECTED NOT DETECTED Final   Carbapenem resistance NDM NOT DETECTED NOT DETECTED Final   Carbapenem resist OXA 48 LIKE NOT DETECTED NOT DETECTED Final   Carbapenem resistance VIM NOT DETECTED NOT DETECTED Final    Comment: Performed at [facility], [address] [place], [place] [address]  Blood culture (routine x 2)     Status: Abnormal   Collection Time: 05/16/21 12:12 AM   Specimen: Right Antecubital; Blood  Result Value Ref Range Status   Specimen Description   Final    RIGHT ANTECUBITAL BOTTLES DRAWN AEROBIC ONLY Performed at [facility], [address] [place], [place] [address]    Special Requests   Final    Blood Culture adequate volume Performed at [facility], [address], [place], [place] [address]    Culture  Setup Time   Final    GRAM NEGATIVE RODS AEROBIC BOTTLE ONLY Gram Stain Report Called to,Read Back By and Verified With: DAWKINS N @ 1410 ON 05/16/21 BY HENDERSON L CRITICAL RESULT CALLED TO, READ BACK BY AND VERIFIED WITH: PHARMD JAMES LEDFORD 05/17/21@3 :28 BY TW Performed at [facility], [place] [address] [place], [place] [address]    Culture ESCHERICHIA COLI (A)  Final   Report Status 05/19/2021 FINAL  Final   Organism ID, Bacteria ESCHERICHIA COLI  Final      Susceptibility   Escherichia coli - MIC*    AMPICILLIN >=32 RESISTANT Resistant     CEFAZOLIN >=64 RESISTANT Resistant     CEFEPIME <=0.12 SENSITIVE Sensitive     CEFTAZIDIME <=1 SENSITIVE Sensitive     CEFTRIAXONE 0.5 SENSITIVE Sensitive     CIPROFLOXACIN <=0.25 SENSITIVE Sensitive     GENTAMICIN <=1  SENSITIVE Sensitive     IMIPENEM <=0.25 SENSITIVE Sensitive     TRIMETH/SULFA <=20 SENSITIVE Sensitive     AMPICILLIN/SULBACTAM >=32 RESISTANT Resistant     PIP/TAZO 8 SENSITIVE Sensitive     * ESCHERICHIA COLI  Blood Culture ID Panel (Reflexed)     Status: Abnormal   Collection Time: 05/16/21 12:12 AM  Result Value Ref Range Status   Enterococcus faecalis NOT DETECTED NOT DETECTED Final   Enterococcus Faecium NOT DETECTED NOT DETECTED Final   Listeria monocytogenes NOT DETECTED NOT DETECTED Final   Staphylococcus species NOT DETECTED NOT DETECTED Final   Staphylococcus aureus (BCID) NOT DETECTED NOT DETECTED Final   Staphylococcus epidermidis NOT DETECTED NOT DETECTED Final   Staphylococcus lugdunensis NOT DETECTED NOT DETECTED Final   Streptococcus species NOT DETECTED NOT DETECTED Final   Streptococcus agalactiae NOT DETECTED NOT DETECTED Final   Streptococcus pneumoniae NOT DETECTED NOT DETECTED Final   Streptococcus pyogenes NOT DETECTED NOT DETECTED Final   A.calcoaceticus-baumannii NOT DETECTED NOT DETECTED Final   Bacteroides fragilis NOT DETECTED NOT DETECTED Final   Enterobacterales DETECTED (A) NOT DETECTED Final    Comment: Enterobacterales represent a large order of gram negative  bacteria, not a single organism. CRITICAL RESULT CALLED TO, READ BACK BY AND VERIFIED WITH: PHARMD JAMES LEDFORD 05/17/21@3 :28 BY TW    Enterobacter cloacae complex NOT DETECTED NOT DETECTED Final   Escherichia coli DETECTED (A) NOT DETECTED Final    Comment: CRITICAL RESULT CALLED TO, READ BACK BY AND VERIFIED WITH: PHARMD JAMES LEDFORD 05/17/21@3 :28 BY TW    Klebsiella aerogenes NOT DETECTED NOT DETECTED Final   Klebsiella oxytoca NOT DETECTED NOT DETECTED Final   Klebsiella pneumoniae NOT DETECTED NOT DETECTED Final   Proteus species NOT DETECTED NOT DETECTED Final   Salmonella species NOT DETECTED NOT DETECTED Final   Serratia marcescens NOT DETECTED NOT DETECTED Final   Haemophilus  influenzae NOT DETECTED NOT DETECTED Final   Neisseria meningitidis NOT DETECTED NOT DETECTED Final   Pseudomonas aeruginosa NOT DETECTED NOT DETECTED Final   Stenotrophomonas maltophilia NOT DETECTED NOT DETECTED Final   Candida albicans NOT DETECTED NOT DETECTED Final   Candida auris NOT DETECTED NOT DETECTED Final   Candida glabrata NOT DETECTED NOT DETECTED Final   Candida krusei NOT DETECTED NOT DETECTED Final   Candida parapsilosis NOT DETECTED NOT DETECTED Final   Candida tropicalis NOT DETECTED NOT DETECTED Final   Cryptococcus neoformans/gattii NOT DETECTED NOT DETECTED Final   CTX-M ESBL NOT DETECTED NOT DETECTED Final   Carbapenem resistance IMP NOT DETECTED NOT DETECTED Final   Carbapenem resistance KPC NOT DETECTED NOT DETECTED Final   Carbapenem resistance NDM NOT DETECTED NOT DETECTED Final   Carbapenem resist OXA 48 LIKE NOT DETECTED NOT DETECTED Final   Carbapenem resistance VIM NOT DETECTED NOT DETECTED Final    Comment: Performed at [facility], [address] [place], [place] [address]  MRSA Next Gen by PCR, Nasal     Status: None   Collection Time: 05/16/21  2:43 AM   Specimen: Nasal Mucosa; Nasal Swab  Result Value Ref Range Status   MRSA by PCR Next Gen NOT DETECTED NOT DETECTED Final    Comment: (NOTE) The GeneXpert MRSA Assay (FDA approved for NASAL specimens only), is one component of a comprehensive MRSA colonization surveillance program. It is not intended to diagnose MRSA infection nor to guide or monitor treatment for MRSA infections. Test performance is not FDA approved in patients less than 39 years old. Performed at [facility], [place] [address], [place], [place] [address]           Radiology Studies: IR REMOVAL TUN ACCESS W/ PORT W/O FL MOD SED  Result Date: 05/20/2021 CLINICAL DATA:  70 year old female with history of breast cancer and recent bacteremia. Port removal requested. EXAM: REMOVAL OF IMPLANTED TUNNELED PORT-A-CATH  MEDICATIONS: None. ANESTHESIA/SEDATION: Moderate (conscious) sedation was employed during this procedure. A total of Versed 2 mg and Fentanyl 50 mcg was administered intravenously. Moderate Sedation Time: 20 minutes. The patient's level of consciousness and vital signs were monitored continuously by radiology nursing throughout the procedure under my direct supervision. FLUOROSCOPY TIME:  None PROCEDURE: Informed written consent was obtained from the patient after a discussion of the risk, benefits and alternatives to the procedure. The patient was positioned supine on the fluoroscopy table and the right chest Port-A-Cath site was prepped with chlorhexidine. A sterile gown and gloves were worn during the procedure. Local anesthesia was provided with 1% lidocaine with epinephrine. A timeout was performed prior to the initiation of the procedure. An incision was made overlying the Port-A-Cath with a #15 scalpel. Utilizing sharp and blunt dissection, the Port-A-Cath was  removed completely. The pocked was irrigated with sterile saline. Wound closure was performed with deep dermal 2-0 and Dermabond. A dressing was placed. The patient tolerated the procedure well without immediate post procedural complication. FINDINGS: Successful removal of implant Port-A-Cath without immediate post procedural complication. IMPRESSION: Successful removal of implanted Port-A-Cath. Ruthann Cancer, MD [facility] Electronically Signed   By: Ruthann Cancer M.D.   On: 05/20/2021 13:56        Scheduled Meds:  (feeding supplement) PROSource Plus  30 mL Oral BID BM   Chlorhexidine Gluconate Cloth  6 each Topical Q0600   collagenase   Topical Daily   feeding supplement  237 mL Oral TID BM   fentaNYL  1 patch Transdermal Q72H   insulin aspart  0-9 Units Subcutaneous Q4H   megestrol  80 mg Oral Q8H   metoprolol succinate  25 mg Oral Daily   multivitamin with minerals  1 tablet  Oral Daily   pantoprazole  40 mg Oral BID   pravastatin  10 mg Oral q1800   sodium chloride flush  10-40 mL Intracatheter Q12H   Continuous Infusions:  cefTRIAXone (ROCEPHIN)  IV 2 g (05/20/21 2050)   remdesivir 100 mg in NS 100 mL 100 mg (05/20/21 1628)     LOS: 6 days     Bonnielee Haff, MD [facility]   If 7PM-7AM, please contact night-coverage [email or web address]  05/21/2021, 10:01 AM

## 2021-05-21 NOTE — Progress Notes (Signed)
This chaplain responded to PMT consult for spiritual care.   The chaplain understands from PMT updates a chaplain phone call to the Pt. room is acceptable to the Pt. The Pt. has COVID entrance precautions.  The chaplain phoned the Pt. room [phone] and the phone was not answered.    This chaplain will try again.  The chaplain understands the Pt. enjoys selecting and discussing scripture.  Chaplain Sallyanne Kuster [phone]

## 2021-05-21 NOTE — Progress Notes (Signed)
Called the floor to check and see if patient was willing to have an MRI.  She has refused the MRI twice today even when offered medication by the RN.

## 2021-05-22 ENCOUNTER — Encounter (HOSPITAL_COMMUNITY)
Admission: EM | Disposition: A | Discharge status: Skilled Nursing Facility | Payer: Self-pay | Source: Home / Self Care | Attending: Internal Medicine

## 2021-05-22 ENCOUNTER — Inpatient Hospital Stay (HOSPITAL_COMMUNITY): Payer: Medicare HMO

## 2021-05-22 LAB — CBC
HCT: 27.9 % — ABNORMAL LOW (ref 36.0–46.0)
Hemoglobin: 8.9 g/dL — ABNORMAL LOW (ref 12.0–15.0)
MCH: 28.6 pg (ref 26.0–34.0)
MCHC: 31.9 g/dL (ref 30.0–36.0)
MCV: 89.7 fL (ref 80.0–100.0)
Platelets: 133 10*3/uL — ABNORMAL LOW (ref 150–400)
RBC: 3.11 MIL/uL — ABNORMAL LOW (ref 3.87–5.11)
RDW: 16.3 % — ABNORMAL HIGH (ref 11.5–15.5)
WBC: 8.2 10*3/uL (ref 4.0–10.5)
nRBC: 0 % (ref 0.0–0.2)

## 2021-05-22 LAB — BASIC METABOLIC PANEL
Anion gap: 5 (ref 5–15)
BUN: 13 mg/dL (ref 8–23)
CO2: 25 mmol/L (ref 22–32)
Calcium: 7.7 mg/dL — ABNORMAL LOW (ref 8.9–10.3)
Chloride: 103 mmol/L (ref 98–111)
Creatinine, Ser: 0.62 mg/dL (ref 0.44–1.00)
GFR, Estimated: >60 mL/min (ref >60)
Glucose, Bld: 89 mg/dL (ref 70–99)
Potassium: 4.1 mmol/L (ref 3.5–5.1)
Sodium: 133 mmol/L — ABNORMAL LOW (ref 135–145)

## 2021-05-22 LAB — GLUCOSE, CAPILLARY
Glucose-Capillary: 95 mg/dL (ref 70–99)
Glucose-Capillary: 75 mg/dL (ref 70–99)
Glucose-Capillary: 80 mg/dL (ref 70–99)
Glucose-Capillary: 126 mg/dL — ABNORMAL HIGH (ref 70–99)
Glucose-Capillary: 123 mg/dL — ABNORMAL HIGH (ref 70–99)

## 2021-05-22 LAB — SEDIMENTATION RATE: Sed Rate: 49 mm/hr — ABNORMAL HIGH (ref 0–22)

## 2021-05-22 LAB — MAGNESIUM: Magnesium: 1.8 mg/dL (ref 1.7–2.4)

## 2021-05-22 SURGERY — ECHOCARDIOGRAM, TRANSESOPHAGEAL
Anesthesia: Monitor Anesthesia Care

## 2021-05-22 MED ORDER — LEVOFLOXACIN 500 MG PO TABS
750.0000 mg | ORAL_TABLET | Freq: Every day | ORAL | Status: DC
  Administered 2021-05-22 – 2021-05-25 (×4): 750 mg via ORAL
  Filled 2021-05-22 (×4): qty 2

## 2021-05-22 MED ORDER — PHENOL 1.4 % MT LIQD: 1.0000 | OROMUCOSAL | Status: DC | PRN

## 2021-05-22 NOTE — Progress Notes (Signed)
Pt is currently on ceftriaxone for e.coli bacteremia likely from portacath. Pt refused TEE. D/w Dr Gale Journey and we will transition to levofloxacin instead due to resistance to cefazolin. Plan to treat for 10d total from date of port removal.   Levofloxacin 750mg  PO qday [place], PharmD, [place], AAHIVP, CPP Infectious Disease Pharmacist 05/22/2021 1:02 PM

## 2021-05-22 NOTE — Progress Notes (Signed)
Occupational Therapy Treatment Patient Details Name: Crystal Vega MRN: [id number] DOB: Jul 07, 1950 Today's Date: 05/22/2021   History of present illness Patient is a 70 yo female admitted to [facility] ED on 12/16 with ABLA.  transfer to [facility] on 12/17 due to hypotension with incr medical issues.  Also positive for COVID.   Pt was at [facility] since early December after a fall.  Patient reports more fatigued and some vaginal bleeding for about 2 weeks. Admitted with hemorrhagic and septic shock, E.Coli bacteremia. s/p D&C of the uterus 12/21 and  Port removal 12/21. PMH of breast cancer undergoing chemo, colonic adenocarcinoma, iron deficiency anemia, DVT on Eliquis   OT comments      Patient not progressing with functional goals this session. Declines attempting to sit EOB despite encouragement from OT and PT and for assist of 2 people. Agreeable to exercise for UEs in supine and OT provided theraband level 2 (orange) with instruction. Pt continues to report pain in L LE and unable to extend knee stating "let's wait until my knee gets straightened out". Pt assisted with repositioning off right hip per request.      Recommendations for follow up therapy are one component of a multi-disciplinary discharge planning process, led by the attending physician.  Recommendations may be updated based on patient status, additional functional criteria and insurance authorization.    Follow Up Recommendations  Skilled nursing-short term rehab (<3 hours/day)    Assistance Recommended at Discharge Frequent or constant Supervision/Assistance  Equipment Recommendations  None recommended by OT    Recommendations for Other Services      Precautions / Restrictions Precautions Precautions: Fall Restrictions Weight Bearing Restrictions: No       Mobility Bed Mobility Overal bed mobility: Needs Assistance Bed Mobility: Rolling Rolling: Max assist;Mod assist;+2 for physical assistance          General bed mobility comments: Asisted with repositioning in bed to offload right hip due to pain, totalA to scoot up in bed. Refused sitting EOB    Transfers                         Balance                                           ADL either performed or assessed with clinical judgement   ADL                                              Extremity/Trunk Assessment Upper Extremity Assessment Upper Extremity Assessment: Generalized weakness;LUE deficits/detail LUE Deficits / Details: grossly -3/4for shoulder, baseline per patient due to "nerve damage since Chemo therapy" per pt.  Pt with 0/5 wrist extension and finger extension LUE Coordination: decreased fine motor;decreased gross motor   Lower Extremity Assessment Lower Extremity Assessment: Defer to PT evaluation        Vision Baseline Vision/History: 1 Wears glasses Ability to See in Adequate Light: 0 Adequate Patient Visual Report: No change from baseline     Perception     Praxis      Cognition Arousal/Alertness: Awake/alert Behavior During Therapy: WFL for tasks assessed/performed Overall Cognitive Status: Within Functional Limits for tasks assessed  Exercises General Exercises - Lower Extremity Ankle Circles/Pumps: AROM;Both;Supine;10 reps Heel Slides: AROM;Right;Supine;10 reps Hip ABduction/ADduction: AROM;Right;10 reps;Supine Other Exercises Other Exercises: Pt instructed on UE exercises with level 2 theraband and able to return demo   Shoulder Instructions       General Comments Assisted with repositioning and there ex.    Pertinent Vitals/ Pain       Pain Assessment: Faces Faces Pain Scale: Hurts little more Breathing: normal Negative Vocalization: none Pain Location: left knee and hip with any movement Pain Descriptors / Indicators: Grimacing;Guarding;Sore;Tender Pain Intervention(s):  Limited activity within patient's tolerance;Monitored during session;Repositioned  Home Living                                          Prior Functioning/Environment              Frequency  Min 2X/week        Progress Toward Goals  OT Goals(current goals can now be found in the care plan section)  Progress towards OT goals: Not progressing toward goals - comment (pt is self limiting/unwilling to fully participate)     Plan Discharge plan remains appropriate;Frequency remains appropriate    Co-evaluation                 AM-PAC OT "6 Clicks" Daily Activity     Outcome Measure   Help from another person eating meals?: A Little Help from another person taking care of personal grooming?: A Little Help from another person toileting, which includes using toliet, bedpan, or urinal?: Total Help from another person bathing (including washing, rinsing, drying)?: A Lot Help from another person to put on and taking off regular upper body clothing?: A Lot Help from another person to put on and taking off regular lower body clothing?: Total 6 Click Score: 12    End of Session    OT Visit Diagnosis: Pain;Muscle weakness (generalized) (M62.81);Unsteadiness on feet (R26.81) Pain - Right/Left: Left Pain - part of body: Hip;Knee;Leg   Activity Tolerance Other (comment) (pt is self limiting)   Patient Left in bed;with call bell/phone within reach   Nurse Communication          Time: 4142-3953 OT Time Calculation (min): 32 min  Charges: OT General Charges $OT Visit: 1 Visit OT Treatments $Therapeutic Exercise: 8-22 mins    Britt Bottom 05/22/2021, 4:14 PM

## 2021-05-22 NOTE — Consult Note (Signed)
Crystal Vega reports feeling well this morning. Her bleeding has been stable. She denies any significant complaints following the D&C.     Past Medical History:  Diagnosis Date   Anemia    Arthritis    per patient " left knee"   DVT (deep venous thrombosis) (HCC)    left leg   Dyspnea    Essential hypertension, benign    Family history of cancer of female genital organ    Family history of GI tract cancer    Metastatic breast cancer ([place])    left breast   Port-A-Cath in place 12/25/2019   Type 2 diabetes mellitus [facility])     Past Surgical History:  Procedure Laterality Date   BIOPSY  11/27/2020   Procedure: BIOPSY;  Surgeon: Daneil Dolin, MD;  [facility];  Service: Endoscopy;;   CATARACT EXTRACTION W/ INTRAOCULAR LENS IMPLANT Right    COLONOSCOPY WITH PROPOFOL N/A 11/27/2020   Procedure: COLONOSCOPY WITH PROPOFOL;  Surgeon: Daneil Dolin, MD;  Location[facility];  Service: Endoscopy;  Laterality: N/A;  9:00am   DILATION AND CURETTAGE OF UTERUS N/A 05/20/2021   Procedure: DILATATION AND CURETTAGE;  Surgeon: Mora Bellman, MD;  Location: [facility];  Service: Gynecology;  Laterality: N/A;   HYSTEROSCOPY WITH D & C N/A 06/05/2014   Procedure: DILATATION AND CURETTAGE /HYSTEROSCOPY;  Surgeon: Florian Buff, MD;  Location: [facility] ORS;  Service: Gynecology;  Laterality: N/A;   IR REMOVAL TUN ACCESS W/ PORT W/O FL MOD SED  05/20/2021   POLYPECTOMY N/A 06/05/2014   Procedure: ENDOMETRIAL POLYPECTOMY;  Surgeon: Florian Buff, MD;  Location: [facility] ORS;  Service: Gynecology;  Laterality: N/A;   PORTACATH PLACEMENT N/A 12/14/2019   Procedure: INSERTION PORT-A-CATH WITH ULTRASOUND GUIDANCE;  Surgeon: Donnie Mesa, MD;  Location: [facility];  Service: General;  Laterality: N/A;   TUBAL LIGATION      Family History  Problem Relation Age of Onset   Diabetes Mellitus II Father    Congestive Heart Failure Father    Colon cancer Mother 69       patient not sure if colon vs stomach    Stroke Maternal Grandmother    Cancer Cousin        female reproductive cancer, dx. 50s/60s    Social History:  reports that she has quit smoking. Her smoking use included cigarettes. She has never used smokeless tobacco. She reports that she does not drink alcohol and does not use drugs.  Allergies: No Known Allergies Medications: I have reviewed the patient's current medications.   Blood pressure 124/87, pulse 93, temperature 97.6 F (36.4 C), temperature source Oral, resp. rate 18, height 5\' 3"  (1.6 m), weight 121.2 kg, SpO2 100 %. Physical Exam GENERAL: Well-developed, well-nourished female in no acute distress.  LUNGS: Clear to auscultation bilaterally.  HEART: Regular rate and rhythm. ABDOMEN: Soft, nontender, nondistended. No organomegaly. PELVIC: Not performed EXTREMITIES: No cyanosis, clubbing, or edema, 2+ distal pulses.  Results for orders placed or performed during the hospital encounter of 05/15/21 (from the past 48 hour(s))  Glucose, capillary     Status: None   Collection Time: 05/20/21  9:50 AM  Result Value Ref Range   Glucose-Capillary 90 70 - 99 mg/dL    Comment: Glucose reference range applies only to samples taken after fasting for at least 8 hours.  Glucose, capillary     Status: Abnormal   Collection Time: 05/20/21  4:17 PM  Result Value Ref Range  Glucose-Capillary 160 (H) 70 - 99 mg/dL    Comment: Glucose reference range applies only to samples taken after fasting for at least 8 hours.  Glucose, capillary     Status: Abnormal   Collection Time: 05/20/21  8:49 PM  Result Value Ref Range   Glucose-Capillary 103 (H) 70 - 99 mg/dL    Comment: Glucose reference range applies only to samples taken after fasting for at least 8 hours.  Glucose, capillary     Status: None   Collection Time: 05/21/21  5:31 AM  Result Value Ref Range   Glucose-Capillary 78 70 - 99 mg/dL    Comment: Glucose reference range applies only to samples taken after fasting for at least  8 hours.  CBC     Status: Abnormal   Collection Time: 05/21/21  6:32 AM  Result Value Ref Range   WBC 6.7 4.0 - 10.5 K/uL   RBC 3.35 (L) 3.87 - 5.11 MIL/uL   Hemoglobin 9.5 (L) 12.0 - 15.0 g/dL   HCT 30.1 (L) 36.0 - 46.0 %   MCV 89.9 80.0 - 100.0 fL   MCH 28.4 26.0 - 34.0 pg   MCHC 31.6 30.0 - 36.0 g/dL   RDW 16.5 (H) 11.5 - 15.5 %   Platelets 130 (L) 150 - 400 K/uL   nRBC 0.0 0.0 - 0.2 %    Comment: Performed at [facility] [address] [place], [place] [address]  Basic metabolic panel     Status: Abnormal   Collection Time: 05/21/21  6:32 AM  Result Value Ref Range   Sodium 133 (L) 135 - 145 mmol/L   Potassium 4.3 3.5 - 5.1 mmol/L    Comment: NO VISIBLE HEMOLYSIS   Chloride 102 98 - 111 mmol/L   CO2 25 22 - 32 mmol/L   Glucose, Bld 105 (H) 70 - 99 mg/dL    Comment: Glucose reference range applies only to samples taken after fasting for at least 8 hours.   BUN 12 8 - 23 mg/dL   Creatinine, Ser 0.59 0.44 - 1.00 mg/dL   Calcium 7.6 (L) 8.9 - 10.3 mg/dL   GFR, Estimated >60 >60 mL/min    Comment: (NOTE) Calculated using the CKD-EPI Creatinine Equation (2021)    Anion gap 6 5 - 15    Comment: Performed at [facility] [address] [place], [place] [address]  Magnesium     Status: Abnormal   Collection Time: 05/21/21  6:32 AM  Result Value Ref Range   Magnesium 1.2 (L) 1.7 - 2.4 mg/dL    Comment: Performed at [facility] [address] [place], [place] [address]  Glucose, capillary     Status: None   Collection Time: 05/21/21  8:17 AM  Result Value Ref Range   Glucose-Capillary 82 70 - 99 mg/dL    Comment: Glucose reference range applies only to samples taken after fasting for at least 8 hours.  Glucose, capillary     Status: Abnormal   Collection Time: 05/21/21  9:44 AM  Result Value Ref Range   Glucose-Capillary 128 (H) 70 - 99 mg/dL    Comment: Glucose reference range applies only to samples taken after fasting for at least 8 hours.   Glucose, capillary     Status: Abnormal   Collection Time: 05/21/21 12:40 PM  Result Value Ref Range   Glucose-Capillary 103 (H) 70 - 99 mg/dL    Comment: Glucose reference range applies only to samples  taken after fasting for at least 8 hours.  Glucose, capillary     Status: Abnormal   Collection Time: 05/21/21  4:27 PM  Result Value Ref Range   Glucose-Capillary 112 (H) 70 - 99 mg/dL    Comment: Glucose reference range applies only to samples taken after fasting for at least 8 hours.  C-reactive protein     Status: Abnormal   Collection Time: 05/21/21  7:00 PM  Result Value Ref Range   CRP 9.6 (H) <1.0 mg/dL    Comment: Performed at [facility], [place] [address], [place], [place] [address]  Glucose, capillary     Status: Abnormal   Collection Time: 05/21/21  9:13 PM  Result Value Ref Range   Glucose-Capillary 118 (H) 70 - 99 mg/dL    Comment: Glucose reference range applies only to samples taken after fasting for at least 8 hours.  Glucose, capillary     Status: None   Collection Time: 05/21/21 11:46 PM  Result Value Ref Range   Glucose-Capillary 95 70 - 99 mg/dL    Comment: Glucose reference range applies only to samples taken after fasting for at least 8 hours.   Comment 1 Document in Chart   Glucose, capillary     Status: None   Collection Time: 05/22/21  4:50 AM  Result Value Ref Range   Glucose-Capillary 95 70 - 99 mg/dL    Comment: Glucose reference range applies only to samples taken after fasting for at least 8 hours.  Basic metabolic panel     Status: Abnormal   Collection Time: 05/22/21  6:14 AM  Result Value Ref Range   Sodium 133 (L) 135 - 145 mmol/L   Potassium 4.1 3.5 - 5.1 mmol/L   Chloride 103 98 - 111 mmol/L   CO2 25 22 - 32 mmol/L   Glucose, Bld 89 70 - 99 mg/dL    Comment: Glucose reference range applies only to samples taken after fasting for at least 8 hours.   BUN 13 8 - 23 mg/dL   Creatinine, Ser 0.62 0.44 - 1.00 mg/dL   Calcium 7.7 (L) 8.9 -  10.3 mg/dL   GFR, Estimated >60 >60 mL/min    Comment: (NOTE) Calculated using the CKD-EPI Creatinine Equation (2021)    Anion gap 5 5 - 15    Comment: Performed at [facility] [address] [place], [place] [address]  Magnesium     Status: None   Collection Time: 05/22/21  6:14 AM  Result Value Ref Range   Magnesium 1.8 1.7 - 2.4 mg/dL    Comment: Performed at [facility] [address] [place], [place] [address]  CBC     Status: Abnormal   Collection Time: 05/22/21  6:14 AM  Result Value Ref Range   WBC 8.2 4.0 - 10.5 K/uL   RBC 3.11 (L) 3.87 - 5.11 MIL/uL   Hemoglobin 8.9 (L) 12.0 - 15.0 g/dL   HCT 27.9 (L) 36.0 - 46.0 %   MCV 89.7 80.0 - 100.0 fL   MCH 28.6 26.0 - 34.0 pg   MCHC 31.9 30.0 - 36.0 g/dL   RDW 16.3 (H) 11.5 - 15.5 %   Platelets 133 (L) 150 - 400 K/uL   nRBC 0.0 0.0 - 0.2 %    Comment: Performed at [facility] [address], [place], [place] [address]    IR REMOVAL TUN ACCESS W/ PORT W/O FL MOD SED  Result Date:  05/20/2021 CLINICAL DATA:  70 year old female with history of breast cancer and recent bacteremia. Port removal requested. EXAM: REMOVAL OF IMPLANTED TUNNELED PORT-A-CATH MEDICATIONS: None. ANESTHESIA/SEDATION: Moderate (conscious) sedation was employed during this procedure. A total of Versed 2 mg and Fentanyl 50 mcg was administered intravenously. Moderate Sedation Time: 20 minutes. The patient's level of consciousness and vital signs were monitored continuously by radiology nursing throughout the procedure under my direct supervision. FLUOROSCOPY TIME:  None PROCEDURE: Informed written consent was obtained from the patient after a discussion of the risk, benefits and alternatives to the procedure. The patient was positioned supine on the fluoroscopy table and the right chest Port-A-Cath site was prepped with chlorhexidine. A sterile gown and gloves were worn during the procedure. Local anesthesia was provided with 1% lidocaine with  epinephrine. A timeout was performed prior to the initiation of the procedure. An incision was made overlying the Port-A-Cath with a #15 scalpel. Utilizing sharp and blunt dissection, the Port-A-Cath was removed completely. The pocked was irrigated with sterile saline. Wound closure was performed with deep dermal 2-0 and Dermabond. A dressing was placed. The patient tolerated the procedure well without immediate post procedural complication. FINDINGS: Successful removal of implant Port-A-Cath without immediate post procedural complication. IMPRESSION: Successful removal of implanted Port-A-Cath. Ruthann Cancer, MD [facility] Electronically Signed   By: Ruthann Cancer M.D.   On: 05/20/2021 13:56   DG Knee Left Port  Result Date: 05/21/2021 CLINICAL DATA:  Swollen knee EXAM: PORTABLE LEFT KNEE - 1-2 VIEW COMPARISON:  None. FINDINGS: Patient not able to fully cooperate with positioning. Suboptimal projections. 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 No acute fracture or mass lesion.  No joint effusion IMPRESSION: Tricompartmental degenerative change.  No acute abnormality. Electronically Signed   By: Franchot Gallo M.D.   On: 05/21/2021 11:12    Assessment/Plan: 70 yo with active metastatic breast and colon cancer with postmenopausal bleeding - Awaiting pathology results from D&C - Continue megace 80 TID - Continue care per primary team  Crystal Vega 05/22/2021

## 2021-05-22 NOTE — TOC Progression Note (Signed)
Transition of Care [facility]) - Progression Note    Patient Details  Name: Crystal Vega MRN: [id number] Date of Birth: 1950-12-13  Transition of Care [facility]) CM/SW [place], [place] Phone Number: 05/22/2021, 11:53 AM  Clinical Narrative:    CSW followed up with [facility] and they noted they will be able to accept pt back with [facility]. They clarified that pt had one an appeal for an extension of stay, but since pt had discharged back to the hospital, the case was closed. CSW was notified that pt will have to be reauthorized at the facility and may need to go through the appeal process again, but the facility won't know until pt is released to their care. CSW relayed this information to pt's husband who is agreeable to pt discharging to [facility] when ready. FL2 will be completed when PT OT make recommendations. TOC will continue to follow.    Expected Discharge Plan: [place] Barriers to Discharge: Continued Medical Work up  Expected Discharge Plan and Services Expected Discharge Plan: [place] Choice: [place] arrangements for the past 2 months: Single Family Home                                       Social Determinants of Health (SDOH) Interventions    Readmission Risk Interventions Readmission Risk Prevention Plan 04/28/2021  Transportation Screening Complete  Home Care Screening Complete  Medication Review (RN CM) Complete  Some recent data might be hidden

## 2021-05-22 NOTE — Progress Notes (Addendum)
Id brief note   Ortho evaluated. At this time doesn't appear they plan on investigating the left hip/knee severe pain/tenderness process  Port removed, thought to be source for her previous ecoli and strep bacteremia  Crystal Vega is pending for today   -continue ceftriaxone -f/u tee -if tee is negative, could switch ceftriaxone to cefdinir 300 mg po bid to finish 7 day course of antibiotics therapy from 12/21 to 12/28  ------------ Addendum  Patient refused tee Discussed with ID pharmacist Devin Going, will change abx to oral quinolone as unclear susceptibility for cefdinir based on cefazolin result   -levoflox -total abx duration until 12/28 -will sign off -if recurrent ecoli bacteremia/worsening hip process would investigate for septic arthritis/endocarditis

## 2021-05-22 NOTE — Progress Notes (Signed)
This chaplain phoned the Pt. in response to the PMT consult for spiritual care.  The Pt. answered the hospital phone and the chaplain introduced herself. The chaplain understands the Pt. is waiting on her lunch at 2pm and declined more than a greeting and promise of prayer today. This chaplain is available for F/U spiritual care as needed.  Chaplain Sallyanne Kuster [phone]

## 2021-05-22 NOTE — Progress Notes (Addendum)
PROGRESS NOTE    Crystal Vega  SWN:[id number] DOB: 1950-08-21 DOA: 05/15/2021 PCP: Lemmie Evens, MD   Brief Narrative: 70 year old with past medical history significant for breast cancer undergoing chemo, colonic adenocarcinoma, iron deficiency anemia, DVT on Eliquis presents to [facility] ED on 12/16 with acute blood loss anemia.  Patient has been feeling more fatigue and report vaginal bleeding for about 2 weeks.  She denies frank red bloody stool but does have a history of colon adenocarcinoma and has occasional bloody stool.  Recent transvaginal ultrasound showed thickened endometrium up to 19 mm with plans to receive endometrial sampling to rule out carcinoma.  Patient was admitted with hypotension, systolic blood pressure in the 60.  Sodium 125 creatinine 1.8, hemoglobin of 5, platelets 97 COVID-positive.  OB/gynecology and GI consulted.  Patient received blood transfusion, started on Megace, she was also started on IV pressors Levophed.  She was admitted by the ICU team.  Patient stabilized, transferred to [facility] 12/19.  Admitted hemorrhagic and septic shock. Received Blood transfusion.  Patient was seen by gynecology.  Underwent D&C on 12/21.  Port-A-Cath was removed on 12/21.      Assessment & Plan:   Hypovolemic Shock, secondary to Vaginal Bleeding. Acute Blood loss anemia;  Transvaginal US 09/24/2020; 19 mm thick endometrium.  Eliquis was placed on hold.  Patient was seen by OB/GYN. Underwent D&C on 12/21. Remains on Megace.  Does not appear to have significant vaginal bleeding at this time.  GYN continues to follow. She did receive 2 units of PRBC on 12/19.  Sinus tachycardia  Likely multifactorial in setting of acute blood loss and sepsis.  Noted to be on beta-blocker at home which was held at the time of admission.  Could have been experiencing some rebound tachycardia.  Metoprolol was resumed.  Improvement in heart rate noted.   Septic Shock, due to E coli Bacteremia;   Blood culture positive for E coli 05/16/2021 ID consulted. They recommend oncology input in regards to [facility]  Case was discussed with patient's oncologist, Dr. Delton Coombes.  He was okay with removal of port.  Port cath was removed on 12/21. TEE planned for today but patient apparently is refusing despite being told the importance of undergoing the study. Patient was complaining of left-sided hip pain and knee pain.  It appears that it has been ongoing for several weeks.  No fractures found on imaging studies done so far.  Concern for septic arthritis.  Per ID recommendations orthopedics was consulted.  They do not feel the patient has septic arthritis.  MRI was ordered however patient has been refusing.  Left knee pain and swelling/polyarthralgia Apart from her left hip pain she also has left knee pain.  X-ray of the knee does not show any joint effusion.  Severe arthritis is noted.  No uric acid level noted in our system.  We will order a uric acid level.   Continue physical therapy.    Acute kidney injury Seems to have resolved with IV fluids.  Monitor urine output.   Noted to have Foley catheter.  This was placed on 12/19 for ? retention.  That was when patient was quite critically ill.  Could do a voiding trial.  Remove Foley catheter today.  Hyponatremia Sodium levels noted to be stable for the most part.    Hypomagnesemia Aggressively repleted.  Noted to be 1.8 today.  Incidental Covid:  Noted to be on oxygen saturating in the late 90s.  Occasional cough and throat irritation.  Chloraseptic spray has been ordered.  She was given Remdesivir for 3 days.  Respiratory status is stable with saturations noted to be normal on room air.    Hx of DVT Was on Eliquis which is currently on hold due to vaginal bleeding.  Resume when cleared to do so by GYN.  Hypertension/hyperlipidemia Metoprolol was resumed.  ARB is on hold.  Blood pressure is reasonably well controlled.  Remains on  pravastatin.  Thrombocytopenia Stable platelet counts.  History of breast and colon cancer Followed by Dr. Delton Coombes with hematology/oncology.  Diabetes mellitus type 2, controlled Monitor CBGs.  SSI.  Goals of care Palliative care following  Morbid obesity Estimated body mass index is 47.33 kg/m as calculated from the following:   Height as of this encounter: 5\' 3"  (1.6 m).   Weight as of this encounter: 121.2 kg.   DVT prophylaxis: SCD Code Status: DNR Family Communication: No family at bedside.  Discussed with patient Disposition: SNF when medically stable  Status is: Inpatient  Remains inpatient appropriate because: E. coli bacteremia      Consultants:  GYN CCM admitted patient.  ID   Procedures:  none  Antimicrobials:    Subjective: Noted to be awake alert.  Denies any chest pain shortness of breath.  Continues to have left hip and knee pain as before.  No nausea vomiting.  Reluctant to undergo studies.   Objective: Vitals:   05/22/21 0400 05/22/21 0449 05/22/21 0521 05/22/21 0821  BP:   124/87 107/76  Pulse:    89  Resp: 14  18 17   Temp:   97.6 F (36.4 C) 98.2 F (36.8 C)  TempSrc:   Oral Oral  SpO2:   100% 100%  Weight:  121.2 kg    Height:        Intake/Output Summary (Last 24 hours) at 05/22/2021 1024 Last data filed at 05/22/2021 0600 Gross per 24 hour  Intake --  Output 1775 ml  Net -1775 ml    Filed Weights   05/15/21 1601 05/16/21 0244 05/22/21 0449  Weight: 114.3 kg 120.8 kg 121.2 kg    Examination:  General appearance: Awake alert.  In no distress Resp: Clear to auscultation bilaterally.  Normal effort Cardio: S1-S2 is normal regular.  No S3-S4.  No rubs murmurs or bruit GI: Abdomen is soft.  Nontender nondistended.  Bowel sounds are present normal.  No masses organomegaly Extremities: Improved movement of the right lower extremity is noted.  Decreased range of motion of the left knee and hand due to issues outlined  above. Neurologic:  No focal neurological deficits.     Data Reviewed: I have personally reviewed following labs and imaging studies  CBC: Recent Labs  Lab 05/15/21 1658 05/16/21 0013 05/16/21 1949 05/18/21 0745 05/19/21 0503 05/20/21 0444 05/21/21 0632 05/22/21 0614  WBC 5.9 8.3   < > 6.5 6.9 7.2 6.7 8.2  NEUTROABS 3.7 6.4  --   --   --   --   --   --   HGB 5.0* 9.3*   < > 7.4* 8.8* 8.5* 9.5* 8.9*  HCT 16.5* 28.9*   < > 23.4* 27.3* 26.0* 30.1* 27.9*  MCV 94.3 92.0   < > 89.3 88.3 88.7 89.9 89.7  PLT 97* 103*   101*   < > 96* 121* 126* 130* 133*   < > = values in this interval not displayed.    Basic Metabolic Panel: Recent Labs  Lab 05/16/21 1948 05/17/21 0441 05/18/21 0159 05/18/21  0745 05/19/21 0503 05/20/21 0444 05/21/21 0632 05/22/21 0614  NA 133*   < > 133*  --  135 135 133* 133*  K 4.0   < > 4.1  --  3.6 3.5 4.3 4.1  CL 101   < > 102  --  104 104 102 103  CO2 24   < > 22  --  26 26 25 25   GLUCOSE 129*   < > 92  --  109* 125* 105* 89  BUN 33*   < > 16  --  11 12 12 13   CREATININE 0.77   < > 0.64  --  0.62 0.77 0.59 0.62  CALCIUM 7.9*   < > 7.7*  --  7.4* 7.5* 7.6* 7.7*  MG 1.5*  --   --  0.9* 1.6*  --  1.2* 1.8   < > = values in this interval not displayed.    GFR: Estimated Creatinine Clearance: 83.7 mL/min (by C-G formula based on SCr of 0.62 mg/dL). Liver Function Tests: Recent Labs  Lab 05/15/21 1659 05/16/21 1948  AST 28 29  ALT 12 14  ALKPHOS 119 117  BILITOT 0.1* 0.8  PROT 5.9* 5.9*  ALBUMIN 1.8* 1.7*    Coagulation Profile: Recent Labs  Lab 05/15/21 1658 05/16/21 0013 05/17/21 1420  INR 2.0* 1.9* 1.3*    CBG: Recent Labs  Lab 05/21/21 1627 05/21/21 2113 05/21/21 2346 05/22/21 0450 05/22/21 1005  GLUCAP 112* 118* 95 95 75     Sepsis Labs: Recent Labs  Lab 05/15/21 2053  LATICACIDVEN 1.3     Recent Results (from the past 240 hour(s))  Resp Panel by RT-PCR (Flu A&B, Covid) Nasopharyngeal Swab     Status:  Abnormal   Collection Time: 05/15/21  6:50 PM   Specimen: Nasopharyngeal Swab; Nasopharyngeal(NP) swabs in vial transport medium  Result Value Ref Range Status   SARS Coronavirus 2 by RT PCR POSITIVE (A) NEGATIVE Final    Comment: RESULT CALLED TO, READ BACK BY AND VERIFIED WITH: SAPPELT,J ON 05/15/21 AT 1950 BY LOY,C (NOTE) SARS-CoV-2 target nucleic acids are DETECTED.  The SARS-CoV-2 RNA is generally detectable in upper respiratory specimens during the acute phase of infection. Positive results are indicative of the presence of the identified virus, but do not rule out bacterial infection or co-infection with other pathogens not detected by the test. Clinical correlation with patient history and other diagnostic information is necessary to determine patient infection status. The expected result is Negative.  Fact Sheet for Patients: EntrepreneurPulse.com.au  Fact Sheet for Healthcare Providers: IncredibleEmployment.be  This test is not yet approved or cleared by the [place] FDA and  has been authorized for detection and/or diagnosis of SARS-CoV-2 by FDA under an Emergency Use Authorization (EUA).  This EUA will remain in effect (meaning this test c an be used) for the duration of  the COVID-19 declaration under Section 564(b)(1) of the Act, 21 U.S.C. section 360bbb-3(b)(1), unless the authorization is terminated or revoked sooner.     Influenza A by PCR NEGATIVE NEGATIVE Final   Influenza B by PCR NEGATIVE NEGATIVE Final    Comment: (NOTE) The Xpert Xpress SARS-CoV-2/FLU/RSV plus assay is intended as an aid in the diagnosis of influenza from Nasopharyngeal swab specimens and should not be used as a sole basis for treatment. Nasal washings and aspirates are unacceptable for Xpert Xpress SARS-CoV-2/FLU/RSV testing.  Fact Sheet for Patients: EntrepreneurPulse.com.au  Fact Sheet for Healthcare  Providers: IncredibleEmployment.be  This test is not yet  approved or cleared by the [place] and has been authorized for detection and/or diagnosis of SARS-CoV-2 by FDA under an Emergency Use Authorization (EUA). This EUA will remain in effect (meaning this test can be used) for the duration of the COVID-19 declaration under Section 564(b)(1) of the Act, 21 U.S.C. section 360bbb-3(b)(1), unless the authorization is terminated or revoked.  Performed at [facility], [address], [place], [place] [address]   Blood culture (routine x 2)     Status: Abnormal   Collection Time: 05/15/21  8:53 PM   Specimen: BLOOD  Result Value Ref Range Status   Specimen Description   Final    BLOOD RIGHT ANTECUBITAL Performed at [facility], [address] [place], [place] [address]    Special Requests   Final    BOTTLES DRAWN AEROBIC AND ANAEROBIC Blood Culture adequate volume Performed at [facility], [address], [place], [place] [address]    Culture  Setup Time   Final    GRAM NEGATIVE RODS ANAEROBIC BOTTLE ONLY aerobic bottle GNR Gram Stain Report Called to,Read Back By and Verified With: DAWKINS @ 1020 ON [id number] BY HENDERSON L CRITICAL RESULT CALLED TO, READ BACK BY AND VERIFIED WITH: G,COFFEE PHARMD @1328  05/16/21 EB IN BOTH AEROBIC AND ANAEROBIC BOTTLES Performed at [facility], [place] [address], [place], [place] [address]    Culture ESCHERICHIA COLI (A)  Final   Report Status 05/18/2021 FINAL  Final   Organism ID, Bacteria ESCHERICHIA COLI  Final      Susceptibility   Escherichia coli - MIC*    AMPICILLIN >=32 RESISTANT Resistant     CEFAZOLIN >=64 RESISTANT Resistant     CEFEPIME <=0.12 SENSITIVE Sensitive     CEFTAZIDIME <=1 SENSITIVE Sensitive     CEFTRIAXONE 1 SENSITIVE Sensitive     CIPROFLOXACIN <=0.25 SENSITIVE Sensitive     GENTAMICIN <=1 SENSITIVE Sensitive     IMIPENEM <=0.25 SENSITIVE Sensitive     TRIMETH/SULFA <=20 SENSITIVE  Sensitive     AMPICILLIN/SULBACTAM >=32 RESISTANT Resistant     * ESCHERICHIA COLI  Blood Culture ID Panel (Reflexed)     Status: Abnormal   Collection Time: 05/15/21  8:53 PM  Result Value Ref Range Status   Enterococcus faecalis NOT DETECTED NOT DETECTED Final   Enterococcus Faecium NOT DETECTED NOT DETECTED Final   Listeria monocytogenes NOT DETECTED NOT DETECTED Final   Staphylococcus species NOT DETECTED NOT DETECTED Final   Staphylococcus aureus (BCID) NOT DETECTED NOT DETECTED Final   Staphylococcus epidermidis NOT DETECTED NOT DETECTED Final   Staphylococcus lugdunensis NOT DETECTED NOT DETECTED Final   Streptococcus species NOT DETECTED NOT DETECTED Final   Streptococcus agalactiae NOT DETECTED NOT DETECTED Final   Streptococcus pneumoniae NOT DETECTED NOT DETECTED Final   Streptococcus pyogenes NOT DETECTED NOT DETECTED Final   A.calcoaceticus-baumannii NOT DETECTED NOT DETECTED Final   Bacteroides fragilis NOT DETECTED NOT DETECTED Final   Enterobacterales DETECTED (A) NOT DETECTED Final    Comment: Enterobacterales represent a large order of gram negative bacteria, not a single organism. CRITICAL RESULT CALLED TO, READ BACK BY AND VERIFIED WITH: G,COFFEE PHARMD @1328  05/16/21 EB    Enterobacter cloacae complex NOT DETECTED NOT DETECTED Final   Escherichia coli DETECTED (A) NOT DETECTED Final    Comment: CRITICAL RESULT CALLED TO, READ BACK BY AND VERIFIED WITH: G,COFFEE PHARMD @1328  05/16/21 EB    Klebsiella aerogenes NOT DETECTED NOT DETECTED Final   Klebsiella oxytoca NOT DETECTED NOT DETECTED Final  Klebsiella pneumoniae NOT DETECTED NOT DETECTED Final   Proteus species NOT DETECTED NOT DETECTED Final   Salmonella species NOT DETECTED NOT DETECTED Final   Serratia marcescens NOT DETECTED NOT DETECTED Final   Haemophilus influenzae NOT DETECTED NOT DETECTED Final   Neisseria meningitidis NOT DETECTED NOT DETECTED Final   Pseudomonas aeruginosa NOT DETECTED NOT  DETECTED Final   Stenotrophomonas maltophilia NOT DETECTED NOT DETECTED Final   Candida albicans NOT DETECTED NOT DETECTED Final   Candida auris NOT DETECTED NOT DETECTED Final   Candida glabrata NOT DETECTED NOT DETECTED Final   Candida krusei NOT DETECTED NOT DETECTED Final   Candida parapsilosis NOT DETECTED NOT DETECTED Final   Candida tropicalis NOT DETECTED NOT DETECTED Final   Cryptococcus neoformans/gattii NOT DETECTED NOT DETECTED Final   CTX-M ESBL NOT DETECTED NOT DETECTED Final   Carbapenem resistance IMP NOT DETECTED NOT DETECTED Final   Carbapenem resistance KPC NOT DETECTED NOT DETECTED Final   Carbapenem resistance NDM NOT DETECTED NOT DETECTED Final   Carbapenem resist OXA 48 LIKE NOT DETECTED NOT DETECTED Final   Carbapenem resistance VIM NOT DETECTED NOT DETECTED Final    Comment: Performed at [facility], [address] [place], [place] [address]  Blood culture (routine x 2)     Status: Abnormal   Collection Time: 05/16/21 12:12 AM   Specimen: Right Antecubital; Blood  Result Value Ref Range Status   Specimen Description   Final    RIGHT ANTECUBITAL BOTTLES DRAWN AEROBIC ONLY Performed at [facility], [address] [place], [place] [address]    Special Requests   Final    Blood Culture adequate volume Performed at [facility], [address], [place], [place] [address]    Culture  Setup Time   Final    GRAM NEGATIVE RODS AEROBIC BOTTLE ONLY Gram Stain Report Called to,Read Back By and Verified With: DAWKINS N @ 1410 ON 05/16/21 BY HENDERSON L CRITICAL RESULT CALLED TO, READ BACK BY AND VERIFIED WITH: PHARMD JAMES LEDFORD 05/17/21@3 :28 BY TW Performed at [facility], [place] [address], [place], [place] [address]    Culture ESCHERICHIA COLI (A)  Final   Report Status 05/19/2021 FINAL  Final   Organism ID, Bacteria ESCHERICHIA COLI  Final      Susceptibility   Escherichia coli - MIC*    AMPICILLIN >=32 RESISTANT Resistant     CEFAZOLIN >=64  RESISTANT Resistant     CEFEPIME <=0.12 SENSITIVE Sensitive     CEFTAZIDIME <=1 SENSITIVE Sensitive     CEFTRIAXONE 0.5 SENSITIVE Sensitive     CIPROFLOXACIN <=0.25 SENSITIVE Sensitive     GENTAMICIN <=1 SENSITIVE Sensitive     IMIPENEM <=0.25 SENSITIVE Sensitive     TRIMETH/SULFA <=20 SENSITIVE Sensitive     AMPICILLIN/SULBACTAM >=32 RESISTANT Resistant     PIP/TAZO 8 SENSITIVE Sensitive     * ESCHERICHIA COLI  Blood Culture ID Panel (Reflexed)     Status: Abnormal   Collection Time: 05/16/21 12:12 AM  Result Value Ref Range Status   Enterococcus faecalis NOT DETECTED NOT DETECTED Final   Enterococcus Faecium NOT DETECTED NOT DETECTED Final   Listeria monocytogenes NOT DETECTED NOT DETECTED Final   Staphylococcus species NOT DETECTED NOT DETECTED Final   Staphylococcus aureus (BCID) NOT DETECTED NOT DETECTED Final   Staphylococcus epidermidis NOT DETECTED NOT DETECTED Final   Staphylococcus lugdunensis NOT DETECTED NOT DETECTED Final   Streptococcus species NOT DETECTED NOT DETECTED Final   Streptococcus agalactiae NOT DETECTED  NOT DETECTED Final   Streptococcus pneumoniae NOT DETECTED NOT DETECTED Final   Streptococcus pyogenes NOT DETECTED NOT DETECTED Final   A.calcoaceticus-baumannii NOT DETECTED NOT DETECTED Final   Bacteroides fragilis NOT DETECTED NOT DETECTED Final   Enterobacterales DETECTED (A) NOT DETECTED Final    Comment: Enterobacterales represent a large order of gram negative bacteria, not a single organism. CRITICAL RESULT CALLED TO, READ BACK BY AND VERIFIED WITH: PHARMD JAMES LEDFORD 05/17/21@3 :28 BY TW    Enterobacter cloacae complex NOT DETECTED NOT DETECTED Final   Escherichia coli DETECTED (A) NOT DETECTED Final    Comment: CRITICAL RESULT CALLED TO, READ BACK BY AND VERIFIED WITH: PHARMD JAMES LEDFORD 05/17/21@3 :28 BY TW    Klebsiella aerogenes NOT DETECTED NOT DETECTED Final   Klebsiella oxytoca NOT DETECTED NOT DETECTED Final   Klebsiella pneumoniae  NOT DETECTED NOT DETECTED Final   Proteus species NOT DETECTED NOT DETECTED Final   Salmonella species NOT DETECTED NOT DETECTED Final   Serratia marcescens NOT DETECTED NOT DETECTED Final   Haemophilus influenzae NOT DETECTED NOT DETECTED Final   Neisseria meningitidis NOT DETECTED NOT DETECTED Final   Pseudomonas aeruginosa NOT DETECTED NOT DETECTED Final   Stenotrophomonas maltophilia NOT DETECTED NOT DETECTED Final   Candida albicans NOT DETECTED NOT DETECTED Final   Candida auris NOT DETECTED NOT DETECTED Final   Candida glabrata NOT DETECTED NOT DETECTED Final   Candida krusei NOT DETECTED NOT DETECTED Final   Candida parapsilosis NOT DETECTED NOT DETECTED Final   Candida tropicalis NOT DETECTED NOT DETECTED Final   Cryptococcus neoformans/gattii NOT DETECTED NOT DETECTED Final   CTX-M ESBL NOT DETECTED NOT DETECTED Final   Carbapenem resistance IMP NOT DETECTED NOT DETECTED Final   Carbapenem resistance KPC NOT DETECTED NOT DETECTED Final   Carbapenem resistance NDM NOT DETECTED NOT DETECTED Final   Carbapenem resist OXA 48 LIKE NOT DETECTED NOT DETECTED Final   Carbapenem resistance VIM NOT DETECTED NOT DETECTED Final    Comment: Performed at [facility], [address], [place], [place] [address]  MRSA Next Gen by PCR, Nasal     Status: None   Collection Time: 05/16/21  2:43 AM   Specimen: Nasal Mucosa; Nasal Swab  Result Value Ref Range Status   MRSA by PCR Next Gen NOT DETECTED NOT DETECTED Final    Comment: (NOTE) The GeneXpert MRSA Assay (FDA approved for NASAL specimens only), is one component of a comprehensive MRSA colonization surveillance program. It is not intended to diagnose MRSA infection nor to guide or monitor treatment for MRSA infections. Test performance is not FDA approved in patients less than 40 years old. Performed at [facility], [place] [address], [place], [place] [address]           Radiology Studies: IR REMOVAL TUN ACCESS W/  PORT W/O FL MOD SED  Result Date: 05/20/2021 CLINICAL DATA:  70 year old female with history of breast cancer and recent bacteremia. Port removal requested. EXAM: REMOVAL OF IMPLANTED TUNNELED PORT-A-CATH MEDICATIONS: None. ANESTHESIA/SEDATION: Moderate (conscious) sedation was employed during this procedure. A total of Versed 2 mg and Fentanyl 50 mcg was administered intravenously. Moderate Sedation Time: 20 minutes. The patient's level of consciousness and vital signs were monitored continuously by radiology nursing throughout the procedure under my direct supervision. FLUOROSCOPY TIME:  None PROCEDURE: Informed written consent was obtained from the patient after a discussion of the risk, benefits and alternatives to the procedure. The patient was positioned supine on the fluoroscopy table and  the right chest Port-A-Cath site was prepped with chlorhexidine. A sterile gown and gloves were worn during the procedure. Local anesthesia was provided with 1% lidocaine with epinephrine. A timeout was performed prior to the initiation of the procedure. An incision was made overlying the Port-A-Cath with a #15 scalpel. Utilizing sharp and blunt dissection, the Port-A-Cath was removed completely. The pocked was irrigated with sterile saline. Wound closure was performed with deep dermal 2-0 and Dermabond. A dressing was placed. The patient tolerated the procedure well without immediate post procedural complication. FINDINGS: Successful removal of implant Port-A-Cath without immediate post procedural complication. IMPRESSION: Successful removal of implanted Port-A-Cath. Ruthann Cancer, MD [facility] Radiology Electronically Signed   By: Ruthann Cancer M.D.   On: 05/20/2021 13:56   DG Knee Left Port  Result Date: 05/21/2021 CLINICAL DATA:  Swollen knee EXAM: PORTABLE LEFT KNEE - 1-2 VIEW COMPARISON:  None. FINDINGS: Patient not able to fully cooperate with positioning.  Suboptimal projections. 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 No acute fracture or mass lesion.  No joint effusion IMPRESSION: Tricompartmental degenerative change.  No acute abnormality. Electronically Signed   By: Franchot Gallo M.D.   On: 05/21/2021 11:12        Scheduled Meds:  (feeding supplement) PROSource Plus  30 mL Oral BID BM   Chlorhexidine Gluconate Cloth  6 each Topical Q0600   collagenase   Topical Daily   feeding supplement  237 mL Oral TID BM   fentaNYL  1 patch Transdermal Q72H   insulin aspart  0-9 Units Subcutaneous Q4H   megestrol  80 mg Oral Q8H   metoprolol succinate  25 mg Oral Daily   multivitamin with minerals  1 tablet Oral Daily   pantoprazole  40 mg Oral BID   pravastatin  10 mg Oral q1800   sodium chloride flush  10-40 mL Intracatheter Q12H   Continuous Infusions:  cefTRIAXone (ROCEPHIN)  IV 2 g (05/21/21 2243)     LOS: 7 days     Bonnielee Haff, MD [facility]   If 7PM-7AM, please contact night-coverage [email or web address]  05/22/2021, 10:24 AM

## 2021-05-22 NOTE — Progress Notes (Signed)
Physical Therapy Treatment Patient Details Name: Crystal Vega MRN: [id number] DOB: 10/09/1950 Today's Date: 05/22/2021   History of Present Illness Patient is a 70 yo female admitted to [facility] ED on 12/16 with ABLA.  transfer to [facility] on 12/17 due to hypotension with incr medical issues.  Also positive for COVID.   Pt was at [place] since early December after a falll.  Patient reports more fatigued and some vaginal bleeding for about 2 weeks. Admitted with hemorrhagic and septic shock, E.Coli bacteremia. s/p D&C of the uterus 12/21 and  Port removal 12/21. PMH of breast cancer undergoing chemo, colonic adenocarcinoma, iron deficiency anemia, DVT on Eliquis    PT Comments    Patient not progressing with mobility this session. Declines attempting to sit EOB despite encouragement and assist of 2 people. Agreeable to exercise and OT provided theraband for UEs. Continues to have pain in LLE, improved from prior session but unable to extend knee. Tolerated exercises supine in bed and assisted with repositioning off right hip per request. Recommend maximove lift to chair per nursing. Will follow.   Recommendations for follow up therapy are one component of a multi-disciplinary discharge planning process, led by the attending physician.  Recommendations may be updated based on patient status, additional functional criteria and insurance authorization.  Follow Up Recommendations  Skilled nursing-short term rehab (<3 hours/day)     Assistance Recommended at Discharge Frequent or constant Supervision/Assistance  Equipment Recommendations  None recommended by PT    Recommendations for Other Services       Precautions / Restrictions Precautions Precautions: Fall Restrictions Weight Bearing Restrictions: No     Mobility  Bed Mobility Overal bed mobility: Needs Assistance             General bed mobility comments: ASsisted with repositioning in bed to offload right hip due to  pain, totalA to scoot up in bed.    Transfers                        Ambulation/Gait                   Stairs             Wheelchair Mobility    Modified Rankin (Stroke Patients Only)       Balance                                            Cognition Arousal/Alertness: Awake/alert Behavior During Therapy: WFL for tasks assessed/performed Overall Cognitive Status: Within Functional Limits for tasks assessed                                          Exercises General Exercises - Lower Extremity Ankle Circles/Pumps: AROM;Both;Supine;10 reps Heel Slides: AROM;Right;Supine;10 reps Hip ABduction/ADduction: AROM;Right;10 reps;Supine    General Comments General comments (skin integrity, edema, etc.): Assisted with repositioning and there ex.      Pertinent Vitals/Pain Pain Assessment: Faces Pain Location: left knee and hip with any movement Pain Descriptors / Indicators: Grimacing;Guarding;Sore;Tender Pain Intervention(s): Monitored during session;Limited activity within patient's tolerance;Repositioned    Home Living  Prior Function            PT Goals (current goals can now be found in the care plan section) Progress towards PT goals: Not progressing toward goals - comment (unwillingness)    Frequency    Min 2X/week      PT Plan Current plan remains appropriate    Co-evaluation              AM-PAC PT "6 Clicks" Mobility   Outcome Measure  Help needed turning from your back to your side while in a flat bed without using bedrails?: Total Help needed moving from lying on your back to sitting on the side of a flat bed without using bedrails?: Total Help needed moving to and from a bed to a chair (including a wheelchair)?: Total Help needed standing up from a chair using your arms (e.g., wheelchair or bedside chair)?: Total Help needed to walk in hospital  room?: Total Help needed climbing 3-5 steps with a railing? : Total 6 Click Score: 6    End of Session   Activity Tolerance: Patient limited by pain;Patient limited by fatigue Patient left: in bed;with call bell/phone within reach;with bed alarm set Nurse Communication: Mobility status;Need for lift equipment PT Visit Diagnosis: Unsteadiness on feet (R26.81);Other abnormalities of gait and mobility (R26.89);Muscle weakness (generalized) (M62.81);Pain Pain - Right/Left: Left Pain - part of body: Leg     Time: 6962-9528 PT Time Calculation (min) (ACUTE ONLY): 31 min  Charges:  $Therapeutic Exercise: 8-22 mins                     [name], PT, DPT Acute Rehabilitation Services Pager [phone] Office [phone]      Marguarite Arbour A Sabra Heck 05/22/2021, 3:41 PM

## 2021-05-23 DIAGNOSIS — Z86718 Personal history of other venous thrombosis and embolism: Secondary | ICD-10-CM

## 2021-05-23 LAB — MAGNESIUM: Magnesium: 1.4 mg/dL — ABNORMAL LOW (ref 1.7–2.4)

## 2021-05-23 LAB — BASIC METABOLIC PANEL
Anion gap: 5 (ref 5–15)
BUN: 12 mg/dL (ref 8–23)
CO2: 27 mmol/L (ref 22–32)
Calcium: 7.8 mg/dL — ABNORMAL LOW (ref 8.9–10.3)
Chloride: 99 mmol/L (ref 98–111)
Creatinine, Ser: 0.53 mg/dL (ref 0.44–1.00)
GFR, Estimated: >60 mL/min (ref >60)
Glucose, Bld: 94 mg/dL (ref 70–99)
Potassium: 4.3 mmol/L (ref 3.5–5.1)
Sodium: 131 mmol/L — ABNORMAL LOW (ref 135–145)

## 2021-05-23 LAB — GLUCOSE, CAPILLARY
Glucose-Capillary: 93 mg/dL (ref 70–99)
Glucose-Capillary: 81 mg/dL (ref 70–99)
Glucose-Capillary: 94 mg/dL (ref 70–99)
Glucose-Capillary: 118 mg/dL — ABNORMAL HIGH (ref 70–99)
Glucose-Capillary: 186 mg/dL — ABNORMAL HIGH (ref 70–99)

## 2021-05-23 LAB — CBC
HCT: 30.6 % — ABNORMAL LOW (ref 36.0–46.0)
Hemoglobin: 9.7 g/dL — ABNORMAL LOW (ref 12.0–15.0)
MCH: 28.4 pg (ref 26.0–34.0)
MCHC: 31.7 g/dL (ref 30.0–36.0)
MCV: 89.5 fL (ref 80.0–100.0)
Platelets: 173 10*3/uL (ref 150–400)
RBC: 3.42 MIL/uL — ABNORMAL LOW (ref 3.87–5.11)
RDW: 16.2 % — ABNORMAL HIGH (ref 11.5–15.5)
WBC: 7.8 10*3/uL (ref 4.0–10.5)
nRBC: 0 % (ref 0.0–0.2)

## 2021-05-23 LAB — URIC ACID: Uric Acid, Serum: 3.5 mg/dL (ref 2.5–7.1)

## 2021-05-23 MED ORDER — MAGNESIUM SULFATE 4 GM/100ML IV SOLN
4.0000 g | Freq: Once | INTRAVENOUS | Status: AC
  Administered 2021-05-23: 09:00:00 4 g via INTRAVENOUS
  Filled 2021-05-23: qty 100

## 2021-05-23 MED ORDER — MAGNESIUM OXIDE -MG SUPPLEMENT 400 (240 MG) MG PO TABS
400.0000 mg | ORAL_TABLET | Freq: Two times a day (BID) | ORAL | Status: DC
  Administered 2021-05-23 – 2021-05-24 (×4): 400 mg via ORAL
  Filled 2021-05-23 (×4): qty 1

## 2021-05-23 NOTE — Progress Notes (Signed)
Declined dressing change at this time

## 2021-05-23 NOTE — Progress Notes (Signed)
PROGRESS NOTE    Crystal Vega  LOV:[id number] DOB: Oct 18, 1950 DOA: 05/15/2021 PCP: Lemmie Evens, MD   Brief Narrative: 70 year old with past medical history significant for breast cancer undergoing chemo, colonic adenocarcinoma, iron deficiency anemia, DVT on Eliquis presents to [facility] ED on 12/16 with acute blood loss anemia.  Patient has been feeling more fatigue and report vaginal bleeding for about 2 weeks.  She denies frank red bloody stool but does have a history of colon adenocarcinoma and has occasional bloody stool.  Recent transvaginal ultrasound showed thickened endometrium up to 19 mm with plans to receive endometrial sampling to rule out carcinoma.  Patient was admitted with hypotension, systolic blood pressure in the 60.  Sodium 125 creatinine 1.8, hemoglobin of 5, platelets 97 COVID-positive.  OB/gynecology and GI consulted.  Patient received blood transfusion, started on Megace, she was also started on IV pressors Levophed.  She was admitted by the ICU team.  Patient stabilized, transferred to [facility] 12/19.  Admitted hemorrhagic and septic shock. Received Blood transfusion.  Patient was seen by gynecology.  Underwent D&C on 12/21.  Port-A-Cath was removed on 12/21.      Assessment & Plan:   Hypovolemic Shock, secondary to Vaginal Bleeding. Acute Blood loss anemia;  Transvaginal US 09/24/2020; 19 mm thick endometrium.  Eliquis was placed on hold.  Patient was seen by OB/GYN. She did receive 2 units of PRBC on 12/19. Underwent D&C on 12/21.  Pathology is pending. Remains on Megace.  Vaginal bleeding appears to have subsided.  GYN continues to follow.   Sinus tachycardia  Likely multifactorial in setting of acute blood loss and sepsis.  Was noted to be on beta-blocker at home which was held at the time of admission.  Could have been experiencing some rebound tachycardia.  Metoprolol was resumed.  Improvement in heart rate noted.   Septic Shock, due to E coli  Bacteremia;  Blood culture positive for E coli 05/16/2021 ID consulted. They recommend oncology input in regards to [facility].  Case was discussed with patient's oncologist, Dr. Delton Coombes.  He was okay with removal of port.  Port cath was removed on 12/21. TEE was recommended by ID.  This is scheduled for 12/23 however patient refused to undergo the study being encouraged to do so.  Has been canceled. Patient was complaining of left-sided hip pain and knee pain.  It appears that it has been ongoing for several weeks.  No fractures found on imaging studies done so far.  Per infectious disease there was some concern for joint infection and so orthopedics was consulted.  Orthopedics evaluated the patient and did not feel there was any concern for septic arthritis. Seen by ID again yesterday and patient has been transitioned from ceftriaxone to levofloxacin.  To continue treatment till 12/28.    Left knee pain and swelling/polyarthralgia Apart from her left hip pain she also has left knee pain.  X-ray of the knee does not show any joint effusion.  Severe arthritis is noted.  Uric acid level is 3.5.  Not suggestive of acute gout. She will need physical therapy.  No further intervention to be done at this time.  Seen by orthopedics as mentioned above. Patient will need to be seen by orthopedics in the outpatient setting.   Acute kidney injury/urinary retention Seems to have resolved with IV fluids.  Monitor urine output.   Noted to have Foley catheter.  This was placed on 12/19 for ? retention.  That was when patient was quite  critically ill.   Foley catheter was removed on 12/23.  Has been able to void on her own.  Continue to monitor.    Hyponatremia Sodium levels noted to be stable for the most part.    Hypomagnesemia Will replete intravenously and will also add scheduled oral magnesium oxide.  Incidental Covid:  Noted to be on oxygen saturating in the late 90s.  Occasional cough and throat  irritation.  Chloraseptic spray has been ordered.  She was given Remdesivir for 3 days.  Respiratory status is stable with saturations noted to be normal on room air.    Hx of DVT DVT was diagnosed in the left leg in October 2021.  DVT was in the setting of malignancy.  Hence anticoagulation likely needs to be continued lifelong if possible.   Patient was on Eliquis which is currently on hold due to vaginal bleeding.  Resume when cleared to do so by GYN.  Essential hypertension/hyperlipidemia Metoprolol was resumed.  ARB is on hold.  Blood pressure is reasonably well controlled.  Remains on pravastatin.  Thrombocytopenia Stable platelet counts.  History of breast and colon cancer Followed by Dr. Delton Coombes with hematology/oncology.  Diabetes mellitus type 2, controlled Monitor CBGs.  SSI.  Goals of care Palliative care following  Morbid obesity Estimated body mass index is 47.33 kg/m as calculated from the following:   Height as of this encounter: 5\' 3"  (1.6 m).   Weight as of this encounter: 121.2 kg.   DVT prophylaxis: SCD Code Status: DNR Family Communication: No family at bedside.  Discussed with patient Disposition: SNF when medically stable  Status is: Inpatient  Remains inpatient appropriate because: E. coli bacteremia    Consultants:  GYN CCM admitted patient.  ID   Procedures:  none  Antimicrobials:    Subjective: Awake alert.  Denies any complaints this morning.  Continues to have limited mobility of the left lower extremity which has been ongoing for several weeks now.   Objective: Vitals:   05/22/21 1800 05/22/21 2000 05/23/21 0500 05/23/21 0700  BP:  110/65  (!) 143/71  Pulse:  80  100  Resp: (!) 22 17  19   Temp:  99.5 F (37.5 C)  98 F (36.7 C)  TempSrc:  Oral  Oral  SpO2: 98% 98%  98%  Weight:   121.2 kg   Height:        Intake/Output Summary (Last 24 hours) at 05/23/2021 0950 Last data filed at 05/23/2021 0845 Gross per 24 hour   Intake --  Output 800 ml  Net -800 ml    Filed Weights   05/16/21 0244 05/22/21 0449 05/23/21 0500  Weight: 120.8 kg 121.2 kg 121.2 kg    Examination:  General appearance: Awake alert.  In no distress.  Mildly distracted Resp: Clear to auscultation bilaterally.  Normal effort Cardio: S1-S2 is normal regular.  No S3-S4.  No rubs murmurs or bruit GI: Abdomen is soft.  Nontender nondistended.  Bowel sounds are present normal.  No masses organomegaly Extremities: Continues to have limited mobility of the left lower extremity with swelling of the left knee.  Stable findings. Neurologic:   No focal neurological deficits.     Data Reviewed: I have personally reviewed following labs and imaging studies  CBC: Recent Labs  Lab 05/19/21 0503 05/20/21 0444 05/21/21 0632 05/22/21 0614 05/23/21 0227  WBC 6.9 7.2 6.7 8.2 7.8  HGB 8.8* 8.5* 9.5* 8.9* 9.7*  HCT 27.3* 26.0* 30.1* 27.9* 30.6*  MCV 88.3 88.7 89.9  89.7 89.5  PLT 121* 126* 130* 133* 841    Basic Metabolic Panel: Recent Labs  Lab 05/18/21 0745 05/19/21 0503 05/20/21 0444 05/21/21 0632 05/22/21 0614 05/23/21 0227  NA  --  135 135 133* 133* 131*  K  --  3.6 3.5 4.3 4.1 4.3  CL  --  104 104 102 103 99  CO2  --  26 26 25 25 27   GLUCOSE  --  109* 125* 105* 89 94  BUN  --  11 12 12 13 12   CREATININE  --  0.62 0.77 0.59 0.62 0.53  CALCIUM  --  7.4* 7.5* 7.6* 7.7* 7.8*  MG 0.9* 1.6*  --  1.2* 1.8 1.4*    GFR: Estimated Creatinine Clearance: 83.7 mL/min (by C-G formula based on SCr of 0.53 mg/dL). Liver Function Tests: Recent Labs  Lab 05/16/21 1948  AST 29  ALT 14  ALKPHOS 117  BILITOT 0.8  PROT 5.9*  ALBUMIN 1.7*    Coagulation Profile: Recent Labs  Lab 05/17/21 1420  INR 1.3*    CBG: Recent Labs  Lab 05/22/21 1121 05/22/21 1616 05/22/21 2053 05/23/21 0506 05/23/21 0608  GLUCAP 80 126* 123* 93 81      Recent Results (from the past 240 hour(s))  Resp Panel by RT-PCR (Flu A&B, Covid)  Nasopharyngeal Swab     Status: Abnormal   Collection Time: 05/15/21  6:50 PM   Specimen: Nasopharyngeal Swab; Nasopharyngeal(NP) swabs in vial transport medium  Result Value Ref Range Status   SARS Coronavirus 2 by RT PCR POSITIVE (A) NEGATIVE Final    Comment: RESULT CALLED TO, READ BACK BY AND VERIFIED WITH: SAPPELT,J ON 05/15/21 AT 1950 BY LOY,C (NOTE) SARS-CoV-2 target nucleic acids are DETECTED.  The SARS-CoV-2 RNA is generally detectable in upper respiratory specimens during the acute phase of infection. Positive results are indicative of the presence of the identified virus, but do not rule out bacterial infection or co-infection with other pathogens not detected by the test. Clinical correlation with patient history and other diagnostic information is necessary to determine patient infection status. The expected result is Negative.  Fact Sheet for Patients: EntrepreneurPulse.com.au  Fact Sheet for Healthcare Providers: IncredibleEmployment.be  This test is not yet approved or cleared by the [place] FDA and  has been authorized for detection and/or diagnosis of SARS-CoV-2 by FDA under an Emergency Use Authorization (EUA).  This EUA will remain in effect (meaning this test c an be used) for the duration of  the COVID-19 declaration under Section 564(b)(1) of the Act, 21 U.S.C. section 360bbb-3(b)(1), unless the authorization is terminated or revoked sooner.     Influenza A by PCR NEGATIVE NEGATIVE Final   Influenza B by PCR NEGATIVE NEGATIVE Final    Comment: (NOTE) The Xpert Xpress SARS-CoV-2/FLU/RSV plus assay is intended as an aid in the diagnosis of influenza from Nasopharyngeal swab specimens and should not be used as a sole basis for treatment. Nasal washings and aspirates are unacceptable for Xpert Xpress SARS-CoV-2/FLU/RSV testing.  Fact Sheet for Patients: EntrepreneurPulse.com.au  Fact Sheet for  Healthcare Providers: IncredibleEmployment.be  This test is not yet approved or cleared by the [place] FDA and has been authorized for detection and/or diagnosis of SARS-CoV-2 by FDA under an Emergency Use Authorization (EUA). This EUA will remain in effect (meaning this test can be used) for the duration of the COVID-19 declaration under Section 564(b)(1) of the Act, 21 U.S.C. section 360bbb-3(b)(1), unless the authorization is terminated or revoked.  Performed at [facility], [address], [place], [place] [address]   Blood culture (routine x 2)     Status: Abnormal   Collection Time: 05/15/21  8:53 PM   Specimen: BLOOD  Result Value Ref Range Status   Specimen Description   Final    BLOOD RIGHT ANTECUBITAL Performed at [facility], [address] [place], [place] [address]    Special Requests   Final    BOTTLES DRAWN AEROBIC AND ANAEROBIC Blood Culture adequate volume Performed at [facility], [address], [place], [place] [address]    Culture  Setup Time   Final    GRAM NEGATIVE RODS ANAEROBIC BOTTLE ONLY aerobic bottle GNR Gram Stain Report Called to,Read Back By and Verified With: DAWKINS @ 1020 ON [id number] BY HENDERSON L CRITICAL RESULT CALLED TO, READ BACK BY AND VERIFIED WITH: G,COFFEE PHARMD @1328  05/16/21 EB IN BOTH AEROBIC AND ANAEROBIC BOTTLES Performed at [facility], [place] [address] [place],  [address]    Culture ESCHERICHIA COLI (A)  Final   Report Status 05/18/2021 FINAL  Final   Organism ID, Bacteria ESCHERICHIA COLI  Final      Susceptibility   Escherichia coli - MIC*    AMPICILLIN >=32 RESISTANT Resistant     CEFAZOLIN >=64 RESISTANT Resistant     CEFEPIME <=0.12 SENSITIVE Sensitive     CEFTAZIDIME <=1 SENSITIVE Sensitive     CEFTRIAXONE 1 SENSITIVE Sensitive     CIPROFLOXACIN <=0.25 SENSITIVE Sensitive     GENTAMICIN <=1 SENSITIVE Sensitive     IMIPENEM <=0.25 SENSITIVE Sensitive     TRIMETH/SULFA <=20  SENSITIVE Sensitive     AMPICILLIN/SULBACTAM >=32 RESISTANT Resistant     * ESCHERICHIA COLI  Blood Culture ID Panel (Reflexed)     Status: Abnormal   Collection Time: 05/15/21  8:53 PM  Result Value Ref Range Status   Enterococcus faecalis NOT DETECTED NOT DETECTED Final   Enterococcus Faecium NOT DETECTED NOT DETECTED Final   Listeria monocytogenes NOT DETECTED NOT DETECTED Final   Staphylococcus species NOT DETECTED NOT DETECTED Final   Staphylococcus aureus (BCID) NOT DETECTED NOT DETECTED Final   Staphylococcus epidermidis NOT DETECTED NOT DETECTED Final   Staphylococcus lugdunensis NOT DETECTED NOT DETECTED Final   Streptococcus species NOT DETECTED NOT DETECTED Final   Streptococcus agalactiae NOT DETECTED NOT DETECTED Final   Streptococcus pneumoniae NOT DETECTED NOT DETECTED Final   Streptococcus pyogenes NOT DETECTED NOT DETECTED Final   A.calcoaceticus-baumannii NOT DETECTED NOT DETECTED Final   Bacteroides fragilis NOT DETECTED NOT DETECTED Final   Enterobacterales DETECTED (A) NOT DETECTED Final    Comment: Enterobacterales represent a large order of gram negative bacteria, not a single organism. CRITICAL RESULT CALLED TO, READ BACK BY AND VERIFIED WITH: G,COFFEE PHARMD @1328  05/16/21 EB    Enterobacter cloacae complex NOT DETECTED NOT DETECTED Final   Escherichia coli DETECTED (A) NOT DETECTED Final    Comment: CRITICAL RESULT CALLED TO, READ BACK BY AND VERIFIED WITH: G,COFFEE PHARMD @1328  05/16/21 EB    Klebsiella aerogenes NOT DETECTED NOT DETECTED Final   Klebsiella oxytoca NOT DETECTED NOT DETECTED Final   Klebsiella pneumoniae NOT DETECTED NOT DETECTED Final   Proteus species NOT DETECTED NOT DETECTED Final   Salmonella species NOT DETECTED NOT DETECTED Final   Serratia marcescens NOT DETECTED NOT DETECTED Final   Haemophilus influenzae NOT DETECTED NOT DETECTED Final   Neisseria meningitidis NOT DETECTED NOT DETECTED Final   Pseudomonas aeruginosa NOT  DETECTED NOT DETECTED  Final   Stenotrophomonas maltophilia NOT DETECTED NOT DETECTED Final   Candida albicans NOT DETECTED NOT DETECTED Final   Candida auris NOT DETECTED NOT DETECTED Final   Candida glabrata NOT DETECTED NOT DETECTED Final   Candida krusei NOT DETECTED NOT DETECTED Final   Candida parapsilosis NOT DETECTED NOT DETECTED Final   Candida tropicalis NOT DETECTED NOT DETECTED Final   Cryptococcus neoformans/gattii NOT DETECTED NOT DETECTED Final   CTX-M ESBL NOT DETECTED NOT DETECTED Final   Carbapenem resistance IMP NOT DETECTED NOT DETECTED Final   Carbapenem resistance KPC NOT DETECTED NOT DETECTED Final   Carbapenem resistance NDM NOT DETECTED NOT DETECTED Final   Carbapenem resist OXA 48 LIKE NOT DETECTED NOT DETECTED Final   Carbapenem resistance VIM NOT DETECTED NOT DETECTED Final    Comment: Performed at [facility], [address] [place], [place] [address]  Blood culture (routine x 2)     Status: Abnormal   Collection Time: 05/16/21 12:12 AM   Specimen: Right Antecubital; Blood  Result Value Ref Range Status   Specimen Description   Final    RIGHT ANTECUBITAL BOTTLES DRAWN AEROBIC ONLY Performed at [facility], [address] [place], [place] [address]    Special Requests   Final    Blood Culture adequate volume Performed at [facility], [address] [place], [place] [address]    Culture  Setup Time   Final    GRAM NEGATIVE RODS AEROBIC BOTTLE ONLY Gram Stain Report Called to,Read Back By and Verified With: DAWKINS N @ 1410 ON 05/16/21 BY HENDERSON L CRITICAL RESULT CALLED TO, READ BACK BY AND VERIFIED WITH: PHARMD JAMES LEDFORD 05/17/21@3 :28 BY TW Performed at [facility], [place] [address] [place], [place] [address]    Culture ESCHERICHIA COLI (A)  Final   Report Status 05/19/2021 FINAL  Final   Organism ID, Bacteria ESCHERICHIA COLI  Final      Susceptibility   Escherichia coli - MIC*    AMPICILLIN >=32 RESISTANT Resistant      CEFAZOLIN >=64 RESISTANT Resistant     CEFEPIME <=0.12 SENSITIVE Sensitive     CEFTAZIDIME <=1 SENSITIVE Sensitive     CEFTRIAXONE 0.5 SENSITIVE Sensitive     CIPROFLOXACIN <=0.25 SENSITIVE Sensitive     GENTAMICIN <=1 SENSITIVE Sensitive     IMIPENEM <=0.25 SENSITIVE Sensitive     TRIMETH/SULFA <=20 SENSITIVE Sensitive     AMPICILLIN/SULBACTAM >=32 RESISTANT Resistant     PIP/TAZO 8 SENSITIVE Sensitive     * ESCHERICHIA COLI  Blood Culture ID Panel (Reflexed)     Status: Abnormal   Collection Time: 05/16/21 12:12 AM  Result Value Ref Range Status   Enterococcus faecalis NOT DETECTED NOT DETECTED Final   Enterococcus Faecium NOT DETECTED NOT DETECTED Final   Listeria monocytogenes NOT DETECTED NOT DETECTED Final   Staphylococcus species NOT DETECTED NOT DETECTED Final   Staphylococcus aureus (BCID) NOT DETECTED NOT DETECTED Final   Staphylococcus epidermidis NOT DETECTED NOT DETECTED Final   Staphylococcus lugdunensis NOT DETECTED NOT DETECTED Final   Streptococcus species NOT DETECTED NOT DETECTED Final   Streptococcus agalactiae NOT DETECTED NOT DETECTED Final   Streptococcus pneumoniae NOT DETECTED NOT DETECTED Final   Streptococcus pyogenes NOT DETECTED NOT DETECTED Final   A.calcoaceticus-baumannii NOT DETECTED NOT DETECTED Final   Bacteroides fragilis NOT DETECTED NOT DETECTED Final   Enterobacterales DETECTED (A) NOT DETECTED Final    Comment: Enterobacterales represent a large order of gram negative bacteria, not a  single organism. CRITICAL RESULT CALLED TO, READ BACK BY AND VERIFIED WITH: PHARMD JAMES LEDFORD 05/17/21@3 :28 BY TW    Enterobacter cloacae complex NOT DETECTED NOT DETECTED Final   Escherichia coli DETECTED (A) NOT DETECTED Final    Comment: CRITICAL RESULT CALLED TO, READ BACK BY AND VERIFIED WITH: PHARMD JAMES LEDFORD 05/17/21@3 :28 BY TW    Klebsiella aerogenes NOT DETECTED NOT DETECTED Final   Klebsiella oxytoca NOT DETECTED NOT DETECTED Final    Klebsiella pneumoniae NOT DETECTED NOT DETECTED Final   Proteus species NOT DETECTED NOT DETECTED Final   Salmonella species NOT DETECTED NOT DETECTED Final   Serratia marcescens NOT DETECTED NOT DETECTED Final   Haemophilus influenzae NOT DETECTED NOT DETECTED Final   Neisseria meningitidis NOT DETECTED NOT DETECTED Final   Pseudomonas aeruginosa NOT DETECTED NOT DETECTED Final   Stenotrophomonas maltophilia NOT DETECTED NOT DETECTED Final   Candida albicans NOT DETECTED NOT DETECTED Final   Candida auris NOT DETECTED NOT DETECTED Final   Candida glabrata NOT DETECTED NOT DETECTED Final   Candida krusei NOT DETECTED NOT DETECTED Final   Candida parapsilosis NOT DETECTED NOT DETECTED Final   Candida tropicalis NOT DETECTED NOT DETECTED Final   Cryptococcus neoformans/gattii NOT DETECTED NOT DETECTED Final   CTX-M ESBL NOT DETECTED NOT DETECTED Final   Carbapenem resistance IMP NOT DETECTED NOT DETECTED Final   Carbapenem resistance KPC NOT DETECTED NOT DETECTED Final   Carbapenem resistance NDM NOT DETECTED NOT DETECTED Final   Carbapenem resist OXA 48 LIKE NOT DETECTED NOT DETECTED Final   Carbapenem resistance VIM NOT DETECTED NOT DETECTED Final    Comment: Performed at [facility], [place] [address], [place], [place] [address]  MRSA Next Gen by PCR, Nasal     Status: None   Collection Time: 05/16/21  2:43 AM   Specimen: Nasal Mucosa; Nasal Swab  Result Value Ref Range Status   MRSA by PCR Next Gen NOT DETECTED NOT DETECTED Final    Comment: (NOTE) The GeneXpert MRSA Assay (FDA approved for NASAL specimens only), is one component of a comprehensive MRSA colonization surveillance program. It is not intended to diagnose MRSA infection nor to guide or monitor treatment for MRSA infections. Test performance is not FDA approved in patients less than 14 years old. Performed at [facility], [place] [address] [place], [place] [address]           Radiology Studies: DG  Knee Left Port  Result Date: 05/21/2021 CLINICAL DATA:  Swollen knee EXAM: PORTABLE LEFT KNEE - 1-2 VIEW COMPARISON:  None. FINDINGS: Patient not able to fully cooperate with positioning. Suboptimal projections. 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 No acute fracture or mass lesion.  No joint effusion IMPRESSION: Tricompartmental degenerative change.  No acute abnormality. Electronically Signed   By: Franchot Gallo M.D.   On: 05/21/2021 11:12      Scheduled Meds:  (feeding supplement) PROSource Plus  30 mL Oral BID BM   Chlorhexidine Gluconate Cloth  6 each Topical Q0600   collagenase   Topical Daily   feeding supplement  237 mL Oral TID BM   fentaNYL  1 patch Transdermal Q72H   insulin aspart  0-9 Units Subcutaneous Q4H   levofloxacin  750 mg Oral q1800   magnesium oxide  400 mg Oral BID   megestrol  80 mg Oral Q8H   metoprolol succinate  25 mg Oral Daily   multivitamin with  minerals  1 tablet Oral Daily   pantoprazole  40 mg Oral BID   pravastatin  10 mg Oral q1800   sodium chloride flush  10-40 mL Intracatheter Q12H   Continuous Infusions:  magnesium sulfate bolus IVPB 4 g (05/23/21 0836)     LOS: 8 days     Bonnielee Haff, MD [facility]   If 7PM-7AM, please contact night-coverage [email or web address]  05/23/2021, 9:50 AM

## 2021-05-23 NOTE — Progress Notes (Signed)
Daily Progress Note   Patient Name: Crystal Vega       Date: 05/23/2021 DOB: 1950-06-25  Age: 70 y.o. MRN#: [id number] Attending Physician: Bonnielee Haff, MD Primary Care Physician: Lemmie Evens, MD Admit Date: 05/15/2021 Length of Stay: 8 days  Reason for Consultation/Follow-up: Establishing goals of care  HPI/Patient Profile:  70 y.o. female  with past medical history of breast cancer undergoing chemo, colonic adenocarcinoma, iron deficiency anemia, DVT on Eliquis presents to [facility] ED on 12/16 with acute blood loss anemia.  Patient has been feeling more fatigue and report vaginal bleeding for about 2 weeks.  She denies frank red bloody stool but does have a history of colon adenocarcinoma and has occasional bloody stool.  Recent transvaginal ultrasound showed thickened endometrium up to 19 mm with plans to receive endometrial sampling to rule out carcinoma.   Patient was admitted with hypotension, systolic blood pressure in the 60.  Sodium 125 creatinine 1.8, hemoglobin of 5, platelets 97 COVID-positive.  OB/gynecology and GI consulted.  Patient received blood transfusion, started on Megace, she was also started on IV pressors Levophed.  She was admitted by the ICU team.   Patient stabilized, transferred to [facility] 12/19.   Admitted hemorrhagic and septic shock. Received Blood transfusion.  Patient was seen by gynecology.  Underwent D&C on 12/21.  Port-A-Cath was removed on 12/21.   PMT was consulted for [place] conversations. Attempted conversations thus far haven't progressed due to patient being very overwhelmed.  Subjective:   Subjective: Chart Reviewed. Updates received. Patient Assessed. Created space and opportunity for patient  and family to explore thoughts and feelings regarding current medical situation.  Today's Discussion: Met with the patient at the bedside.  She states she is still quite overwhelmed.  However, we discussed the discharge plan and mention including  discharge to skilled nursing facility rehab.  I asked her if she is okay with this.  She states "I do not really have much choice".  We discussed that there are resources, but does depend on her goals.  We have further extensive discussion on her goals which she settled on "getting better (at least is much as possible) and get stronger to be able to function more independently at home."  She would like to follow-up with outpatient oncology to decide what options she does have left.  No life or treatment decisions are desired to be made in the hospital, which is reasonable given her established relationship with her trusted oncologist Dr. Delton Coombes.   We had further discussions on life, the role of palliative care for ongoing support, challenges with her multiple diagnoses.  I provided emotional support to her as she faces these uphill battles.  She seems to understand, at least somewhat, where she is at with all of this.  I offered outpatient palliative care follow-up to help assist with further goals of care discussions as she follows up with oncology for further treatment decisions.  She seemed to really liked this idea and accepted.  I provided emotional general support through therapeutic listening, empathy, and other techniques.  Answered all questions and addressed all concerns to the best of my ability.  Review of Systems  Objective:   Vital Signs:  BP 131/72 (BP Location: Right Leg)    Pulse 93    Temp 98.6 F (37 C) (Oral)    Resp 19    Ht _0  (1.6 m)    Wt 121.2 kg    SpO2 99%    BMI  47.33 kg/m   Physical Exam: Physical Exam  Palliative Assessment/Data: 40-50%   Assessment & Plan:   Impression: Present on Admission:  Circulatory failure ([place])  Hypovolemic shock ([place])  70 year old female with metastatic breast cancer and now with concerns for possible endometrial cancer status post D&C with postmenopausal vaginal bleeding and endometrial thickening.  She seems to be in a  difficult situation.  Because of sepsis her port was removed.  Pending TEE, possible MRI although she feels overwhelmed by all of these requests.  Long-term prognosis does not seem good.  However, given how overwhelmed she is we have elected to hold off on definite decision making initially.  Spoke with patient today who identifies goals to "get better and stronger" to support more independent functioning at home, avoid passing COVID-19 to her husband, and follow-up with oncology for options.  We will continue to support the patient while she is inpatient.  SUMMARY OF RECOMMENDATIONS   Remain DNR GOALS seems clear D/C plan in place for SNF/rehab (patient agrees) Continued emotional and spiritual support during difficult times We will follow peripherally for any additional needs  Code Status: DNR  Prognosis: Unable to determine  Discharge Planning:  SNF rehab  Discussed with: Medical team, nursing team, patient  Thank you for allowing [place] to participate in the care of ANDERIA LORENZO PMT will continue to support holistically.  Time Total: 75 min  Visit consisted of counseling and education dealing with the complex and emotionally intense issues of symptom management and palliative care in the setting of serious and potentially life-threatening illness. Greater than 50%  of this time was spent counseling and coordinating care related to the above assessment and plan.  Walden Field, NP Palliative Medicine Team  Team Phone # [phone] (Nights/Weekends)  01/27/2021, 8:17 AM

## 2021-05-24 LAB — GLUCOSE, CAPILLARY
Glucose-Capillary: 123 mg/dL — ABNORMAL HIGH (ref 70–99)
Glucose-Capillary: 92 mg/dL (ref 70–99)
Glucose-Capillary: 86 mg/dL (ref 70–99)
Glucose-Capillary: 148 mg/dL — ABNORMAL HIGH (ref 70–99)
Glucose-Capillary: 134 mg/dL — ABNORMAL HIGH (ref 70–99)
Glucose-Capillary: 140 mg/dL — ABNORMAL HIGH (ref 70–99)

## 2021-05-24 NOTE — Progress Notes (Signed)
PROGRESS NOTE    Crystal JIMMERSON  KDT:[id number] DOB: 03/01/1951 DOA: 05/15/2021 PCP: Lemmie Evens, MD   Brief Narrative: 70 year old with past medical history significant for breast cancer undergoing chemo, colonic adenocarcinoma, iron deficiency anemia, DVT on Eliquis presents to [facility] ED on 12/16 with acute blood loss anemia.  Patient has been feeling more fatigue and report vaginal bleeding for about 2 weeks.  She denies frank red bloody stool but does have a history of colon adenocarcinoma and has occasional bloody stool.  Recent transvaginal ultrasound showed thickened endometrium up to 19 mm with plans to receive endometrial sampling to rule out carcinoma.  Patient was admitted with hypotension, systolic blood pressure in the 60.  Sodium 125 creatinine 1.8, hemoglobin of 5, platelets 97 COVID-positive.  OB/gynecology and GI consulted.  Patient received blood transfusion, started on Megace, she was also started on IV pressors Levophed.  She was admitted by the ICU team.  Patient stabilized, transferred to [facility] 12/19.  Admitted hemorrhagic and septic shock. Received Blood transfusion.  Patient was seen by gynecology.  Underwent D&C on 12/21.  Port-A-Cath was removed on 12/21.      Assessment & Plan:   Hypovolemic Shock, secondary to Vaginal Bleeding. Acute Blood loss anemia;  Transvaginal US 09/24/2020; 19 mm thick endometrium.  Eliquis was placed on hold.  Patient was seen by OB/GYN. She did receive 2 units of PRBC on 12/19. Underwent D&C on 12/21.  Pathology is pending. Remains on Megace.  Vaginal bleeding appears to have subsided.  GYN continues to follow.  Will need to determine from GYN when it is okay to resume patient's Eliquis.  We will reach out to them tomorrow.  Sinus tachycardia  Likely multifactorial in setting of acute blood loss and sepsis.  Was noted to be on beta-blocker at home which was held at the time of admission.  Could have been experiencing some  rebound tachycardia.  Metoprolol was resumed.   Heart rate has improved and stable.  Telemetry was discontinued.  Septic Shock, due to E coli Bacteremia;  Blood culture positive for E coli 05/16/2021 ID consulted. They recommend oncology input in regards to [facility].  Case was discussed with patient's oncologist, Dr. Delton Coombes.  He was okay with removal of port.  Port cath was removed on 12/21. TEE was recommended by ID.  This is scheduled for 12/23 however patient refused to undergo the study being encouraged to do so.  Has been canceled. Patient was complaining of left-sided hip pain and knee pain.  It appears that it has been ongoing for several weeks.  No fractures found on imaging studies done so far.  Per infectious disease there was some concern for joint infection and so orthopedics was consulted.  Orthopedics evaluated the patient and did not feel there was any concern for septic arthritis. Patient was subsequently transitioned from ceftriaxone to levofloxacin. Final recommendations from ID is to continue levofloxacin until 12/28.    Left knee pain and swelling/polyarthralgia Apart from her left hip pain she also has left knee pain.  X-ray of the knee does not show any joint effusion.  Severe arthritis is noted.  Uric acid level is 3.5.  Not suggestive of acute gout. She will need physical therapy.  No further intervention to be done at this time.  Seen by orthopedics as mentioned above.  Patient will need to be seen by orthopedics in the outpatient setting.  Can be seen by Dr. Aline Brochure in [place].  Acute kidney injury/urinary retention  Seems to have resolved with IV fluids.  Monitor urine output.   Noted to have Foley catheter.  This was placed on 12/19 for ? retention.  That was when patient was quite critically ill.   Foley catheter was removed on 12/23.  Has been able to void on her own.  Continue to monitor.    Hyponatremia Sodium level has been low but stable for the most  part.  Hypomagnesemia Was repleted intravenously and placed on oral magnesium oxide.  We will recheck labs tomorrow.  Incidental Covid:  Noted to be on oxygen saturating in the late 90s.  Occasional cough and throat irritation.  Chloraseptic spray has been ordered.  She was given Remdesivir for 3 days.  Respiratory status is stable with saturations noted to be normal on room air.   She tested positive on 12/16.  She will complete 10 days of isolation on 12/26.  Can come off of isolation on 12/27.  Hx of DVT DVT was diagnosed in the left leg in October 2021.  DVT was in the setting of malignancy.  Hence anticoagulation likely needs to be continued lifelong if possible.   Patient was on Eliquis which is currently on hold due to vaginal bleeding.  Resume when cleared to do so by GYN.  Essential hypertension/hyperlipidemia Metoprolol was resumed.  ARB is on hold.  Blood pressure is reasonably well controlled.  Remains on pravastatin.  Thrombocytopenia Stable platelet counts.  History of breast and colon cancer Followed by Dr. Delton Coombes with hematology/oncology.  Diabetes mellitus type 2, controlled CBGs are reasonably well controlled.  Continue to monitor.  SSI   Goals of care Palliative care following  Morbid obesity Estimated body mass index is 47.33 kg/m as calculated from the following:   Height as of this encounter: 5\' 3"  (1.6 m).   Weight as of this encounter: 121.2 kg.   DVT prophylaxis: SCD Code Status: DNR Family Communication: No family at bedside.  Discussed with patient Disposition: SNF when medically stable  Status is: Inpatient  Remains inpatient appropriate because: E. coli bacteremia    Consultants:  GYN CCM admitted patient.  ID   Procedures:  none  Antimicrobials:    Subjective: Denies any complaints.  Slept well overnight.   Objective: Vitals:   05/23/21 1150 05/23/21 1616 05/23/21 2100 05/24/21 0908  BP: 131/72 (!) 109/96  121/76   Pulse: 93 82  80  Resp: 19 16  12   Temp: 98.6 F (37 C) 98.1 F (36.7 C) 98.6 F (37 C) 98.3 F (36.8 C)  TempSrc: Oral Oral  Oral  SpO2: 99% 98%  99%  Weight:      Height:        Intake/Output Summary (Last 24 hours) at 05/24/2021 1015 Last data filed at 05/24/2021 0500 Gross per 24 hour  Intake 130 ml  Output 750 ml  Net -620 ml    Filed Weights   05/16/21 0244 05/22/21 0449 05/23/21 0500  Weight: 120.8 kg 121.2 kg 121.2 kg    Examination:  General appearance: Awake alert.  In no distress Resp: Clear to auscultation bilaterally.  Normal effort Cardio: S1-S2 is normal regular.  No S3-S4.  No rubs murmurs or bruit GI: Abdomen is soft.  Nontender nondistended.  Bowel sounds are present normal.  No masses organomegaly Extremities: Limited mobility of the left lower extremity as discussed previously no changes. Neurologic:  No focal neurological deficits.      Data Reviewed: I have personally reviewed following labs and imaging  studies  CBC: Recent Labs  Lab 05/19/21 0503 05/20/21 0444 05/21/21 0632 05/22/21 0614 05/23/21 0227  WBC 6.9 7.2 6.7 8.2 7.8  HGB 8.8* 8.5* 9.5* 8.9* 9.7*  HCT 27.3* 26.0* 30.1* 27.9* 30.6*  MCV 88.3 88.7 89.9 89.7 89.5  PLT 121* 126* 130* 133* 161    Basic Metabolic Panel: Recent Labs  Lab 05/18/21 0745 05/19/21 0503 05/20/21 0444 05/21/21 0632 05/22/21 0614 05/23/21 0227  NA  --  135 135 133* 133* 131*  K  --  3.6 3.5 4.3 4.1 4.3  CL  --  104 104 102 103 99  CO2  --  26 26 25 25 27   GLUCOSE  --  109* 125* 105* 89 94  BUN  --  11 12 12 13 12   CREATININE  --  0.62 0.77 0.59 0.62 0.53  CALCIUM  --  7.4* 7.5* 7.6* 7.7* 7.8*  MG 0.9* 1.6*  --  1.2* 1.8 1.4*    GFR: Estimated Creatinine Clearance: 83.7 mL/min (by C-G formula based on SCr of 0.53 mg/dL).   Coagulation Profile: Recent Labs  Lab 05/17/21 1420  INR 1.3*    CBG: Recent Labs  Lab 05/23/21 1144 05/23/21 1611 05/23/21 2014 05/24/21 0505  05/24/21 0907  GLUCAP 94 118* 186* 123* 92      Recent Results (from the past 240 hour(s))  Resp Panel by RT-PCR (Flu A&B, Covid) Nasopharyngeal Swab     Status: Abnormal   Collection Time: 05/15/21  6:50 PM   Specimen: Nasopharyngeal Swab; Nasopharyngeal(NP) swabs in vial transport medium  Result Value Ref Range Status   SARS Coronavirus 2 by RT PCR POSITIVE (A) NEGATIVE Final    Comment: RESULT CALLED TO, READ BACK BY AND VERIFIED WITH: SAPPELT,J ON 05/15/21 AT 1950 BY LOY,C (NOTE) SARS-CoV-2 target nucleic acids are DETECTED.  The SARS-CoV-2 RNA is generally detectable in upper respiratory specimens during the acute phase of infection. Positive results are indicative of the presence of the identified virus, but do not rule out bacterial infection or co-infection with other pathogens not detected by the test. Clinical correlation with patient history and other diagnostic information is necessary to determine patient infection status. The expected result is Negative.  Fact Sheet for Patients: EntrepreneurPulse.com.au  Fact Sheet for Healthcare Providers: IncredibleEmployment.be  This test is not yet approved or cleared by the [place] FDA and  has been authorized for detection and/or diagnosis of SARS-CoV-2 by FDA under an Emergency Use Authorization (EUA).  This EUA will remain in effect (meaning this test c an be used) for the duration of  the COVID-19 declaration under Section 564(b)(1) of the Act, 21 U.S.C. section 360bbb-3(b)(1), unless the authorization is terminated or revoked sooner.     Influenza A by PCR NEGATIVE NEGATIVE Final   Influenza B by PCR NEGATIVE NEGATIVE Final    Comment: (NOTE) The Xpert Xpress SARS-CoV-2/FLU/RSV plus assay is intended as an aid in the diagnosis of influenza from Nasopharyngeal swab specimens and should not be used as a sole basis for treatment. Nasal washings and aspirates are unacceptable  for Xpert Xpress SARS-CoV-2/FLU/RSV testing.  Fact Sheet for Patients: EntrepreneurPulse.com.au  Fact Sheet for Healthcare Providers: IncredibleEmployment.be  This test is not yet approved or cleared by the [place] FDA and has been authorized for detection and/or diagnosis of SARS-CoV-2 by FDA under an Emergency Use Authorization (EUA). This EUA will remain in effect (meaning this test can be used) for the duration of the COVID-19 declaration under Section 564(b)(1)  of the Act, 21 U.S.C. section 360bbb-3(b)(1), unless the authorization is terminated or revoked.  Performed at [facility], [address], [place], [place] [address]   Blood culture (routine x 2)     Status: Abnormal   Collection Time: 05/15/21  8:53 PM   Specimen: BLOOD  Result Value Ref Range Status   Specimen Description   Final    BLOOD RIGHT ANTECUBITAL Performed at [facility] [place] [facility], [address] [place], [place] [address]    Special Requests   Final    BOTTLES DRAWN AEROBIC AND ANAEROBIC Blood Culture adequate volume Performed at [facility], [address] [place], [place] [address]    Culture  Setup Time   Final    GRAM NEGATIVE RODS ANAEROBIC BOTTLE ONLY aerobic bottle GNR Gram Stain Report Called to,Read Back By and Verified With: DAWKINS @ 1020 ON [id number] BY HENDERSON L CRITICAL RESULT CALLED TO, READ BACK BY AND VERIFIED WITH: G,COFFEE PHARMD @1328  05/16/21 EB IN BOTH AEROBIC AND ANAEROBIC BOTTLES Performed at [facility], [place] [address] [place], [place] [address]    Culture ESCHERICHIA COLI (A)  Final   Report Status 05/18/2021 FINAL  Final   Organism ID, Bacteria ESCHERICHIA COLI  Final      Susceptibility   Escherichia coli - MIC*    AMPICILLIN >=32 RESISTANT Resistant     CEFAZOLIN >=64 RESISTANT Resistant     CEFEPIME <=0.12 SENSITIVE Sensitive     CEFTAZIDIME <=1 SENSITIVE Sensitive     CEFTRIAXONE 1 SENSITIVE Sensitive     CIPROFLOXACIN  <=0.25 SENSITIVE Sensitive     GENTAMICIN <=1 SENSITIVE Sensitive     IMIPENEM <=0.25 SENSITIVE Sensitive     TRIMETH/SULFA <=20 SENSITIVE Sensitive     AMPICILLIN/SULBACTAM >=32 RESISTANT Resistant     * ESCHERICHIA COLI  Blood Culture ID Panel (Reflexed)     Status: Abnormal   Collection Time: 05/15/21  8:53 PM  Result Value Ref Range Status   Enterococcus faecalis NOT DETECTED NOT DETECTED Final   Enterococcus Faecium NOT DETECTED NOT DETECTED Final   Listeria monocytogenes NOT DETECTED NOT DETECTED Final   Staphylococcus species NOT DETECTED NOT DETECTED Final   Staphylococcus aureus (BCID) NOT DETECTED NOT DETECTED Final   Staphylococcus epidermidis NOT DETECTED NOT DETECTED Final   Staphylococcus lugdunensis NOT DETECTED NOT DETECTED Final   Streptococcus species NOT DETECTED NOT DETECTED Final   Streptococcus agalactiae NOT DETECTED NOT DETECTED Final   Streptococcus pneumoniae NOT DETECTED NOT DETECTED Final   Streptococcus pyogenes NOT DETECTED NOT DETECTED Final   A.calcoaceticus-baumannii NOT DETECTED NOT DETECTED Final   Bacteroides fragilis NOT DETECTED NOT DETECTED Final   Enterobacterales DETECTED (A) NOT DETECTED Final    Comment: Enterobacterales represent a large order of gram negative bacteria, not a single organism. CRITICAL RESULT CALLED TO, READ BACK BY AND VERIFIED WITH: G,COFFEE PHARMD @1328  05/16/21 EB    Enterobacter cloacae complex NOT DETECTED NOT DETECTED Final   Escherichia coli DETECTED (A) NOT DETECTED Final    Comment: CRITICAL RESULT CALLED TO, READ BACK BY AND VERIFIED WITH: G,COFFEE PHARMD @1328  05/16/21 EB    Klebsiella aerogenes NOT DETECTED NOT DETECTED Final   Klebsiella oxytoca NOT DETECTED NOT DETECTED Final   Klebsiella pneumoniae NOT DETECTED NOT DETECTED Final   Proteus species NOT DETECTED NOT DETECTED Final   Salmonella species NOT DETECTED NOT DETECTED Final   Serratia marcescens NOT DETECTED NOT DETECTED Final   Haemophilus  influenzae NOT DETECTED NOT DETECTED Final  Neisseria meningitidis NOT DETECTED NOT DETECTED Final   Pseudomonas aeruginosa NOT DETECTED NOT DETECTED Final   Stenotrophomonas maltophilia NOT DETECTED NOT DETECTED Final   Candida albicans NOT DETECTED NOT DETECTED Final   Candida auris NOT DETECTED NOT DETECTED Final   Candida glabrata NOT DETECTED NOT DETECTED Final   Candida krusei NOT DETECTED NOT DETECTED Final   Candida parapsilosis NOT DETECTED NOT DETECTED Final   Candida tropicalis NOT DETECTED NOT DETECTED Final   Cryptococcus neoformans/gattii NOT DETECTED NOT DETECTED Final   CTX-M ESBL NOT DETECTED NOT DETECTED Final   Carbapenem resistance IMP NOT DETECTED NOT DETECTED Final   Carbapenem resistance KPC NOT DETECTED NOT DETECTED Final   Carbapenem resistance NDM NOT DETECTED NOT DETECTED Final   Carbapenem resist OXA 48 LIKE NOT DETECTED NOT DETECTED Final   Carbapenem resistance VIM NOT DETECTED NOT DETECTED Final    Comment: Performed at [facility], [address] [place], [place] [address]  Blood culture (routine x 2)     Status: Abnormal   Collection Time: 05/16/21 12:12 AM   Specimen: Right Antecubital; Blood  Result Value Ref Range Status   Specimen Description   Final    RIGHT ANTECUBITAL BOTTLES DRAWN AEROBIC ONLY Performed at [facility], [address] [place], [place] [address]    Special Requests   Final    Blood Culture adequate volume Performed at [facility], [address] [place], [place] [address]    Culture  Setup Time   Final    GRAM NEGATIVE RODS AEROBIC BOTTLE ONLY Gram Stain Report Called to,Read Back By and Verified With: DAWKINS N @ 1410 ON 05/16/21 BY HENDERSON L CRITICAL RESULT CALLED TO, READ BACK BY AND VERIFIED WITH: PHARMD JAMES LEDFORD 05/17/21@3 :28 BY TW Performed at [facility], [place] [address], [place], [place] [address]    Culture ESCHERICHIA COLI (A)  Final   Report Status 05/19/2021 FINAL  Final   Organism ID,  Bacteria ESCHERICHIA COLI  Final      Susceptibility   Escherichia coli - MIC*    AMPICILLIN >=32 RESISTANT Resistant     CEFAZOLIN >=64 RESISTANT Resistant     CEFEPIME <=0.12 SENSITIVE Sensitive     CEFTAZIDIME <=1 SENSITIVE Sensitive     CEFTRIAXONE 0.5 SENSITIVE Sensitive     CIPROFLOXACIN <=0.25 SENSITIVE Sensitive     GENTAMICIN <=1 SENSITIVE Sensitive     IMIPENEM <=0.25 SENSITIVE Sensitive     TRIMETH/SULFA <=20 SENSITIVE Sensitive     AMPICILLIN/SULBACTAM >=32 RESISTANT Resistant     PIP/TAZO 8 SENSITIVE Sensitive     * ESCHERICHIA COLI  Blood Culture ID Panel (Reflexed)     Status: Abnormal   Collection Time: 05/16/21 12:12 AM  Result Value Ref Range Status   Enterococcus faecalis NOT DETECTED NOT DETECTED Final   Enterococcus Faecium NOT DETECTED NOT DETECTED Final   Listeria monocytogenes NOT DETECTED NOT DETECTED Final   Staphylococcus species NOT DETECTED NOT DETECTED Final   Staphylococcus aureus (BCID) NOT DETECTED NOT DETECTED Final   Staphylococcus epidermidis NOT DETECTED NOT DETECTED Final   Staphylococcus lugdunensis NOT DETECTED NOT DETECTED Final   Streptococcus species NOT DETECTED NOT DETECTED Final   Streptococcus agalactiae NOT DETECTED NOT DETECTED Final   Streptococcus pneumoniae NOT DETECTED NOT DETECTED Final   Streptococcus pyogenes NOT DETECTED NOT DETECTED Final   A.calcoaceticus-baumannii NOT DETECTED NOT DETECTED Final   Bacteroides fragilis NOT DETECTED NOT DETECTED Final   Enterobacterales DETECTED (A) NOT DETECTED Final  Comment: Enterobacterales represent a large order of gram negative bacteria, not a single organism. CRITICAL RESULT CALLED TO, READ BACK BY AND VERIFIED WITH: PHARMD JAMES LEDFORD 05/17/21@3 :28 BY TW    Enterobacter cloacae complex NOT DETECTED NOT DETECTED Final   Escherichia coli DETECTED (A) NOT DETECTED Final    Comment: CRITICAL RESULT CALLED TO, READ BACK BY AND VERIFIED WITH: PHARMD JAMES LEDFORD 05/17/21@3 :28 BY  TW    Klebsiella aerogenes NOT DETECTED NOT DETECTED Final   Klebsiella oxytoca NOT DETECTED NOT DETECTED Final   Klebsiella pneumoniae NOT DETECTED NOT DETECTED Final   Proteus species NOT DETECTED NOT DETECTED Final   Salmonella species NOT DETECTED NOT DETECTED Final   Serratia marcescens NOT DETECTED NOT DETECTED Final   Haemophilus influenzae NOT DETECTED NOT DETECTED Final   Neisseria meningitidis NOT DETECTED NOT DETECTED Final   Pseudomonas aeruginosa NOT DETECTED NOT DETECTED Final   Stenotrophomonas maltophilia NOT DETECTED NOT DETECTED Final   Candida albicans NOT DETECTED NOT DETECTED Final   Candida auris NOT DETECTED NOT DETECTED Final   Candida glabrata NOT DETECTED NOT DETECTED Final   Candida krusei NOT DETECTED NOT DETECTED Final   Candida parapsilosis NOT DETECTED NOT DETECTED Final   Candida tropicalis NOT DETECTED NOT DETECTED Final   Cryptococcus neoformans/gattii NOT DETECTED NOT DETECTED Final   CTX-M ESBL NOT DETECTED NOT DETECTED Final   Carbapenem resistance IMP NOT DETECTED NOT DETECTED Final   Carbapenem resistance KPC NOT DETECTED NOT DETECTED Final   Carbapenem resistance NDM NOT DETECTED NOT DETECTED Final   Carbapenem resist OXA 48 LIKE NOT DETECTED NOT DETECTED Final   Carbapenem resistance VIM NOT DETECTED NOT DETECTED Final    Comment: Performed at [facility], [address], [place], [place] [address]  MRSA Next Gen by PCR, Nasal     Status: None   Collection Time: 05/16/21  2:43 AM   Specimen: Nasal Mucosa; Nasal Swab  Result Value Ref Range Status   MRSA by PCR Next Gen NOT DETECTED NOT DETECTED Final    Comment: (NOTE) The GeneXpert MRSA Assay (FDA approved for NASAL specimens only), is one component of a comprehensive MRSA colonization surveillance program. It is not intended to diagnose MRSA infection nor to guide or monitor treatment for MRSA infections. Test performance is not FDA approved in patients less than 23  years old. Performed at [facility], [place] [address] [place], [place] [address]        Radiology Studies: No results found.    Scheduled Meds:  (feeding supplement) PROSource Plus  30 mL Oral BID BM   Chlorhexidine Gluconate Cloth  6 each Topical Q0600   collagenase   Topical Daily   feeding supplement  237 mL Oral TID BM   fentaNYL  1 patch Transdermal Q72H   insulin aspart  0-9 Units Subcutaneous Q4H   levofloxacin  750 mg Oral q1800   magnesium oxide  400 mg Oral BID   megestrol  80 mg Oral Q8H   metoprolol succinate  25 mg Oral Daily   multivitamin with minerals  1 tablet Oral Daily   pantoprazole  40 mg Oral BID   pravastatin  10 mg Oral q1800   sodium chloride flush  10-40 mL Intracatheter Q12H   Continuous Infusions:     LOS: 9 days   Bonnielee Haff, MD [facility]   If 7PM-7AM, please contact night-coverage [email or web address]  05/24/2021, 10:15 AM

## 2021-05-24 NOTE — NC FL2 (Signed)
[place] LEVEL OF CARE SCREENING TOOL     IDENTIFICATION  Patient Name: Crystal Vega Birthdate: [birth year] Sex: female Admission Date (Current Location): 05/15/2021  [facility] and [place] Number:  Herbalist and Address:  [facility] [place], [place] [address], [place], [place] [address]      Provider Number: [id number]  Attending Physician Name and Address:  Bonnielee Haff, MD  Relative Name and Phone Number:  Antonella Upson, [phone]    Current Level of Care: Hospital Recommended Level of Care: [place] Prior Approval Number:    Date Approved/Denied:   PASRR Number: [id number]  Discharge Plan: SNF    Current Diagnoses: Patient Active Problem List   Diagnosis Date Noted   Hip pain    Acute pain of left knee    Severe sepsis with septic shock [facility])    Postmenopausal vaginal bleeding    Hypovolemic shock ([place]) 05/16/2021   COVID-19    Symptomatic anemia    Vaginal bleeding    AKI (acute kidney injury) ([place])    Hyperglycemia due to diabetes mellitus ([place])    Gastrointestinal hemorrhage with melena    Circulatory failure ([place]) 05/15/2021   E coli bacteremia 05/04/2021   Pressure injury of skin 04/28/2021   Proteus Mirabilis and Klebsiella UTI (urinary tract infection) 04/28/2021   Fall 04/27/2021   Tachycardia 04/27/2021   Thrombocytopenia ([place]) 04/27/2021   Chronic anemia 04/27/2021   Drug-induced neutropenia ([place]) 02/03/2021   Iron deficiency anemia 02/03/2021   Nausea without vomiting 11/12/2020   Rectal bleeding 08/06/2020   Left leg DVT ([place]) 03/03/2020   Family history of GI tract cancer    Family history of cancer of female genital organ    Port-A-Cath in place 12/25/2019   Colonic mass 12/19/2019   Goals of care, counseling/discussion 12/17/2019   Malignant neoplasm of overlapping sites of left breast in female, estrogen receptor positive ([place]) 11/28/2019   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[place]) 11/28/2019   Heme positive stool 02/27/2018   FH: colon cancer 02/27/2018   Constipation 02/27/2018   Simple endometrial 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 04/16/2014   Atypical chest pain 10/10/2013   Essential hypertension, benign 10/10/2013   Morbid obesity ([place]) 10/10/2013   Type 2 diabetes mellitus ([place]) 10/10/2013    Orientation RESPIRATION BLADDER Height & Weight     Self, Time, Situation, Place  Normal Continent Weight: 267 lb 3.2 oz (121.2 kg) Height:  5\' 3"  (160 cm)  BEHAVIORAL SYMPTOMS/MOOD NEUROLOGICAL BOWEL NUTRITION STATUS      Incontinent Diet (see dc summary)  AMBULATORY STATUS COMMUNICATION OF NEEDS Skin   Extensive Assist Verbally PU Stage and Appropriate Care (Pressure injuries on Heel, Buttock and Thigh. Active Breast Cancer, Left, Vaginal incision)                       Personal Care Assistance Level of Assistance  Bathing, Feeding, Dressing Bathing Assistance: Maximum assistance Feeding assistance: Limited assistance Dressing Assistance: Maximum assistance     Functional Limitations Info  Sight, Hearing, Speech Sight Info: Adequate Hearing Info: Adequate Speech Info: Adequate    SPECIAL CARE FACTORS FREQUENCY  PT (By licensed PT), OT (By licensed OT)     PT Frequency: 5x week OT Frequency: 5x week            Contractures Contractures Info: Not present    Additional Factors Info  Code Status, Allergies Code Status Info: DNR Allergies  Info: NKA           Current Medications (05/24/2021):  This is the current hospital active medication list Current Facility-Administered Medications  Medication Dose Route Frequency Provider Last Rate Last Admin   (feeding supplement) PROSource Plus liquid 30 mL  30 mL Oral BID BM Constant, Peggy, MD   30 mL at 05/24/21 0946   busPIRone (BUSPAR) tablet 10 mg  10 mg Oral TID PRN Constant, Peggy, MD       Chlorhexidine Gluconate Cloth 2 % PADS 6 each  6 each Topical Q0600 Constant,  Peggy, MD   6 each at 05/24/21 0906   collagenase (SANTYL) ointment   Topical Daily Constant, Peggy, MD   Given at 05/24/21 0947   docusate sodium (COLACE) capsule 100 mg  100 mg Oral BID PRN Constant, Peggy, MD   100 mg at 05/23/21 2103   feeding supplement (ENSURE ENLIVE / ENSURE PLUS) liquid 237 mL  237 mL Oral TID BM Constant, Peggy, MD   237 mL at 05/24/21 0947   fentaNYL (DURAGESIC) 50 MCG/HR 1 patch  1 patch Transdermal Q72H Constant, Peggy, MD   1 patch at 05/22/21 1205   insulin aspart (novoLOG) injection 0-9 Units  0-9 Units Subcutaneous Q4H Constant, Peggy, MD   1 Units at 05/24/21 0906   levofloxacin (LEVAQUIN) tablet 750 mg  750 mg Oral q1800 Pham, Minh Q, RPH-CPP   750 mg at 05/23/21 1618   lip balm (CARMEX) ointment   Topical PRN Constant, Peggy, MD       magnesium oxide (MAG-OX) tablet 400 mg  400 mg Oral BID Bonnielee Haff, MD   400 mg at 05/24/21 0946   megestrol (MEGACE) tablet 80 mg  80 mg Oral Q8H Constant, Peggy, MD   80 mg at 05/24/21 0522   metoprolol succinate (TOPROL-XL) 24 hr tablet 25 mg  25 mg Oral Daily Bonnielee Haff, MD   25 mg at 05/24/21 0946   multivitamin with minerals tablet 1 tablet  1 tablet Oral Daily Constant, Peggy, MD   1 tablet at 05/23/21 5643   oxyCODONE (Oxy IR/ROXICODONE) immediate release tablet 5 mg  5 mg Oral Q6H PRN Constant, Peggy, MD   5 mg at 05/23/21 2104   pantoprazole (PROTONIX) EC tablet 40 mg  40 mg Oral BID Constant, Peggy, MD   40 mg at 05/24/21 0946   phenol (CHLORASEPTIC) mouth spray 1 spray  1 spray Mouth/Throat PRN Bonnielee Haff, MD       polyethylene glycol (MIRALAX / GLYCOLAX) packet 17 g  17 g Oral Daily PRN Constant, Peggy, MD       pravastatin (PRAVACHOL) tablet 10 mg  10 mg Oral q1800 Constant, Peggy, MD   10 mg at 05/23/21 1618   sodium chloride flush (NS) 0.9 % injection 10-40 mL  10-40 mL Intracatheter Q12H Constant, Peggy, MD   10 mL at 05/24/21 0947   sodium chloride flush (NS) 0.9 % injection 10-40 mL  10-40 mL  Intracatheter PRN Constant, Peggy, MD         Discharge Medications: Please see discharge summary for a list of discharge medications.  Relevant Imaging Results:  Relevant Lab Results:   Additional Information SSN: [id number], [place]

## 2021-05-25 LAB — GLUCOSE, CAPILLARY
Glucose-Capillary: 72 mg/dL (ref 70–99)
Glucose-Capillary: 69 mg/dL — ABNORMAL LOW (ref 70–99)
Glucose-Capillary: 103 mg/dL — ABNORMAL HIGH (ref 70–99)
Glucose-Capillary: 124 mg/dL — ABNORMAL HIGH (ref 70–99)
Glucose-Capillary: 180 mg/dL — ABNORMAL HIGH (ref 70–99)

## 2021-05-25 LAB — CBC
HCT: 28 % — ABNORMAL LOW (ref 36.0–46.0)
Hemoglobin: 8.9 g/dL — ABNORMAL LOW (ref 12.0–15.0)
MCH: 28.4 pg (ref 26.0–34.0)
MCHC: 31.8 g/dL (ref 30.0–36.0)
MCV: 89.5 fL (ref 80.0–100.0)
Platelets: UNDETERMINED 10*3/uL (ref 150–400)
RBC: 3.13 MIL/uL — ABNORMAL LOW (ref 3.87–5.11)
RDW: 16.3 % — ABNORMAL HIGH (ref 11.5–15.5)
WBC: 7.8 10*3/uL (ref 4.0–10.5)
nRBC: 0 % (ref 0.0–0.2)

## 2021-05-25 LAB — BASIC METABOLIC PANEL
Anion gap: 7 (ref 5–15)
BUN: 10 mg/dL (ref 8–23)
CO2: 25 mmol/L (ref 22–32)
Calcium: 7.9 mg/dL — ABNORMAL LOW (ref 8.9–10.3)
Chloride: 104 mmol/L (ref 98–111)
Creatinine, Ser: 0.67 mg/dL (ref 0.44–1.00)
GFR, Estimated: >60 mL/min (ref >60)
Glucose, Bld: 86 mg/dL (ref 70–99)
Potassium: 4.2 mmol/L (ref 3.5–5.1)
Sodium: 136 mmol/L (ref 135–145)

## 2021-05-25 LAB — MAGNESIUM: Magnesium: 1.3 mg/dL — ABNORMAL LOW (ref 1.7–2.4)

## 2021-05-25 MED ORDER — FENTANYL 50 MCG/HR TD PT72: 1.0000 | MEDICATED_PATCH | TRANSDERMAL | 0 refills | Status: AC

## 2021-05-25 MED ORDER — MEGESTROL ACETATE 40 MG PO TABS: 80.0000 mg | ORAL_TABLET | Freq: Three times a day (TID) | ORAL | Status: AC

## 2021-05-25 MED ORDER — LEVOFLOXACIN 750 MG PO TABS: 750.0000 mg | ORAL_TABLET | Freq: Every day | ORAL | Status: AC

## 2021-05-25 MED ORDER — PANTOPRAZOLE SODIUM 40 MG PO TBEC: 40.0000 mg | DELAYED_RELEASE_TABLET | Freq: Every day | ORAL | Status: AC

## 2021-05-25 MED ORDER — HYDROMORPHONE HCL 2 MG PO TABS: 2.0000 mg | ORAL_TABLET | ORAL | 0 refills | Status: AC | PRN

## 2021-05-25 MED ORDER — MAGNESIUM OXIDE -MG SUPPLEMENT 400 (240 MG) MG PO TABS
800.0000 mg | ORAL_TABLET | Freq: Two times a day (BID) | ORAL | Status: DC
  Administered 2021-05-25 – 2021-05-26 (×3): 800 mg via ORAL
  Filled 2021-05-25 (×3): qty 2

## 2021-05-25 MED ORDER — ADULT MULTIVITAMIN W/MINERALS CH: 1.0000 | ORAL_TABLET | Freq: Every day | ORAL | Status: AC

## 2021-05-25 MED ORDER — MAGNESIUM OXIDE -MG SUPPLEMENT 400 (240 MG) MG PO TABS
800.0000 mg | ORAL_TABLET | Freq: Two times a day (BID) | ORAL | Status: AC
Start: 2021-05-25 — End: ?

## 2021-05-25 MED ORDER — GABAPENTIN 300 MG PO CAPS: 300.0000 mg | ORAL_CAPSULE | Freq: Two times a day (BID) | ORAL | 3 refills | Status: AC

## 2021-05-25 MED ORDER — MAGNESIUM SULFATE 4 GM/100ML IV SOLN
4.0000 g | Freq: Once | INTRAVENOUS | Status: AC
  Administered 2021-05-25: 15:00:00 4 g via INTRAVENOUS
  Filled 2021-05-25: qty 100

## 2021-05-25 NOTE — TOC Progression Note (Signed)
Transition of Care [facility]) - Progression Note    Patient Details  Name: Crystal Vega MRN: [id number] Date of Birth: May 03, 1951  [facility] CM/SW Contact  Joanne Chars, LCSW Phone Number: 05/25/2021, 9:57 AM  Clinical Narrative:   Message left with Debbie/[place] about receiving pt today.  Awaiting response.      Expected Discharge Plan: [place] Barriers to Discharge: Continued Medical Work up  Expected Discharge Plan and Services Expected Discharge Plan: [place] Choice: [place] arrangements for the past 2 months: Single Family Home                                       Social Determinants of Health (SDOH) Interventions    Readmission Risk Interventions Readmission Risk Prevention Plan 04/28/2021  Transportation Screening Complete  Home Care Screening Complete  Medication Review (RN CM) Complete  Some recent data might be hidden

## 2021-05-25 NOTE — Discharge Summary (Signed)
[facility]  Physician Discharge Summary   Patient ID: Crystal Vega MRN: [id number] DOB/AGE: [birth year] 69 y.o.  Admit date: 05/15/2021 Discharge date:   05/25/2021   PCP: Lemmie Evens, MD  DISCHARGE DIAGNOSES:  Hypovolemic shock secondary to vaginal bleeding Postmenopausal vaginal bleeding/DU B Acute blood loss anemia Sepsis secondary to E. coli bacteremia Left-sided hip and knee pain likely due to severe arthritis Incidental COVID infection History of DVT Essential hypertension History of breast and colon cancer   RECOMMENDATIONS FOR OUTPATIENT FOLLOW UP: GYN has cleared for the patient to resume her Eliquis.  If there is recurrence of bleeding consideration may be given to low-dose Eliquis.  May need to discuss this further with patient's oncologist if she has recurrence of bleeding. Please check CBC basic metabolic panel and magnesium level before the end of this week, and then check weekly [facility]eds [facility] in 2 weeks   Home Health: SNF Equipment/Devices: None  CODE STATUS: DNR  DISCHARGE CONDITION: fair  Diet recommendation: Modified carbohydrate  INITIAL HISTORY: 70 year old with past medical history significant for breast cancer undergoing chemo, colonic adenocarcinoma, iron deficiency anemia, DVT on Eliquis presents to [facility] ED on 12/16 with acute blood loss anemia.  Patient has been feeling more fatigue and report vaginal bleeding for about 2 weeks.  She denies frank red bloody stool but does have a history of colon adenocarcinoma and has occasional bloody stool.  Recent transvaginal ultrasound showed thickened endometrium up to 19 mm with plans to receive endometrial sampling to rule out carcinoma.   Patient was admitted with hypotension, systolic blood pressure in the 60.  Sodium 125 creatinine 1.8, hemoglobin of 5, platelets 97 COVID-positive.  OB/gynecology and GI consulted.  Patient received blood  transfusion, started on Megace, she was also started on IV pressors Levophed.  She was admitted by the ICU team.   Patient stabilized, transferred to [facility] 12/19.   Admitted hemorrhagic and septic shock. Received Blood transfusion.  Patient was seen by gynecology.  Underwent D&C on 05/20/21.  Port-A-Cath was removed on 05/20/21.      Consultations: GYN Infectious disease Interventional radiology Palliative care  Procedures: Removal of Port-A-Cath D&C   HOSPITAL COURSE:   Hypovolemic Shock, secondary to Vaginal Bleeding. Acute Blood loss anemia Transvaginal US 09/24/2020; 19 mm thick endometrium.  Eliquis was placed on hold.  Patient was seen by OB/GYN. She did receive 2 units of PRBC on 12/19. Underwent D&C on 12/21.  Pathology is pending.  GYN will follow this in the outpatient setting. Remains on Megace.  To be continued on this medication Vaginal bleeding appears to have subsided.  Discussed with Dr. Roselie Awkward with the GYN today.  Continue Megace at current dose.  Okay to resume Eliquis.   Septic Shock, due to E coli Bacteremia;  Blood culture positive for E coli 05/16/2021 ID consulted. They recommend oncology input in regards to [facility].  Case was discussed with patient's oncologist, Dr. Delton Coombes.  He was okay with removal of port.  Port cath was removed on 12/21. TEE was recommended by ID.  This is scheduled for 12/23 however patient refused to undergo the study being encouraged to do so.  Has been canceled. Patient was complaining of left-sided hip pain and knee pain.  It appears that it has been ongoing for several weeks.  No fractures found on imaging studies done so far.  Per infectious disease there was some concern for joint infection and so orthopedics was consulted.  Orthopedics evaluated the patient and did not feel there was any concern for septic arthritis. Patient was subsequently transitioned from ceftriaxone to levofloxacin. Final recommendations from ID is to  continue levofloxacin until 12/28.     Left knee pain and swelling/polyarthralgia Apart from her left hip pain she also has left knee pain.  X-ray of the knee does not show any joint effusion.  Severe arthritis is noted.  Uric acid level is 3.5.  Not suggestive of acute gout. She will need physical therapy.  No further intervention to be done at this time.  Seen by orthopedics as mentioned above.  Patient will need to be seen by orthopedics in the outpatient setting.  Can be seen by Dr. Aline Brochure in [place].   Acute kidney injury/urinary retention Seems to have resolved with IV fluids.  Monitor urine output.   Noted to have Foley catheter.  This was placed on 12/19 for ? retention.  That was when patient was quite critically ill.   Foley catheter was removed on 12/23.  Has been able to void on her own.    Hyponatremia Sodium level has been low but stable for the most part.   Hypomagnesemia Was repleted intravenously.  Started on oral magnesium oxide.  Will benefit from this be checked again in the next few days   Incidental Covid:  She was given Remdesivir for 3 days.  Respiratory status is stable with saturations noted to be normal on room air.   She tested positive on 12/16.  She will complete 10 days of isolation on 12/26.  Can come off of isolation on 12/27.   Hx of DVT DVT was diagnosed in the left leg in October 2021.  DVT was in the setting of malignancy.  Hence anticoagulation likely needs to be continued lifelong if possible.   Eliquis was held due to vaginal bleeding.  Bleeding appears to have subsided.  Discussed with GYN who have cleared the patient to resume Eliquis.  If she has recurrence of bleeding this may need to be discussed further with her oncologist Dr. Delton Coombes.   Essential hypertension/hyperlipidemia Metoprolol was resumed.  ARB is on hold.  Blood pressure is reasonably well controlled.  Remains on pravastatin.  Sinus tachycardia  Likely multifactorial in  setting of acute blood loss and sepsis.  Was noted to be on beta-blocker at home which was held at the time of admission.  Could have been experiencing some rebound tachycardia.  Metoprolol was resumed.   Heart rate has improved and stable.  Telemetry was discontinued.   Thrombocytopenia Stable platelet counts.  History of breast and colon cancer Followed by Dr. Delton Coombes with hematology/oncology.   Diabetes mellitus type 2, controlled CBGs are reasonably well controlled.    Goals of care Palliative care to follow at skilled nursing facility  Morbid obesity Estimated body mass index is 47.33 kg/m as calculated from the following:   Height as of this encounter: 5\' 3"  (1.6 m).   Weight as of this encounter: 121.2 kg.  Stage II decubitus left thigh.  Stage II decubitus buttocks Pressure Injury 05/16/21 Heel Right Deep Tissue Pressure Injury - Purple or maroon localized area of discolored intact skin or blood-filled blister due to damage of underlying soft tissue from pressure and/or shear. (Active)  05/16/21 0300  Location: Heel  Location Orientation: Right  Staging: Deep Tissue Pressure Injury - Purple or maroon localized area of discolored intact skin or blood-filled blister due to damage of underlying soft tissue from pressure and/or  shear.  Wound Description (Comments):   Present on Admission: Yes     Pressure Injury 05/16/21 Buttocks Left Unstageable - Full thickness tissue loss in which the base of the injury is covered by slough (yellow, tan, gray, green or brown) and/or eschar (tan, brown or black) in the wound bed. (Active)  05/16/21 0300  Location: Buttocks  Location Orientation: Left  Staging: Unstageable - Full thickness tissue loss in which the base of the injury is covered by slough (yellow, tan, gray, green or brown) and/or eschar (tan, brown or black) in the wound bed.  Wound Description (Comments):   Present on Admission: Yes     Pressure Injury 05/18/21 Thigh  Left;Posterior;Proximal Stage 2 -  Partial thickness loss of dermis presenting as a shallow open injury with a red, pink wound bed without slough. pink, red (Active)  05/18/21 1245  Location: Thigh  Location Orientation: Left;Posterior;Proximal  Staging: Stage 2 -  Partial thickness loss of dermis presenting as a shallow open injury with a red, pink wound bed without slough. (skin tear)  Wound Description (Comments): pink, red  Present on Admission: Yes     Pressure Injury 05/18/21 Buttocks Medial Stage 2 -  Partial thickness loss of dermis presenting as a shallow open injury with a red, pink wound bed without slough. red pink (Active)  05/18/21 1245  Location: Buttocks  Location Orientation: Medial  Staging: Stage 2 -  Partial thickness loss of dermis presenting as a shallow open injury with a red, pink wound bed without slough.  Wound Description (Comments): red pink  Present on Admission: Yes    Patient is stable for discharge back to her skilled nursing facility.   PERTINENT LABS:  The results of significant diagnostics from this hospitalization (including imaging, microbiology, ancillary and laboratory) are listed below for reference.    Microbiology: Recent Results (from the past 240 hour(s))  Resp Panel by RT-PCR (Flu A&B, Covid) Nasopharyngeal Swab     Status: Abnormal   Collection Time: 05/15/21  6:50 PM   Specimen: Nasopharyngeal Swab; Nasopharyngeal(NP) swabs in vial transport medium  Result Value Ref Range Status   SARS Coronavirus 2 by RT PCR POSITIVE (A) NEGATIVE Final    Comment: RESULT CALLED TO, READ BACK BY AND VERIFIED WITH: SAPPELT,J ON 05/15/21 AT 1950 BY LOY,C (NOTE) SARS-CoV-2 target nucleic acids are DETECTED.  The SARS-CoV-2 RNA is generally detectable in upper respiratory specimens during the acute phase of infection. Positive results are indicative of the presence of the identified virus, but do not rule out bacterial infection or co-infection with  other pathogens not detected by the test. Clinical correlation with patient history and other diagnostic information is necessary to determine patient infection status. The expected result is Negative.  Fact Sheet for Patients: EntrepreneurPulse.com.au  Fact Sheet for Healthcare Providers: IncredibleEmployment.be  This test is not yet approved or cleared by the [place] FDA and  has been authorized for detection and/or diagnosis of SARS-CoV-2 by FDA under an Emergency Use Authorization (EUA).  This EUA will remain in effect (meaning this test c an be used) for the duration of  the COVID-19 declaration under Section 564(b)(1) of the Act, 21 U.S.C. section 360bbb-3(b)(1), unless the authorization is terminated or revoked sooner.     Influenza A by PCR NEGATIVE NEGATIVE Final   Influenza B by PCR NEGATIVE NEGATIVE Final    Comment: (NOTE) The Xpert Xpress SARS-CoV-2/FLU/RSV plus assay is intended as an aid in the diagnosis of influenza from Nasopharyngeal swab  specimens and should not be used as a sole basis for treatment. Nasal washings and aspirates are unacceptable for Xpert Xpress SARS-CoV-2/FLU/RSV testing.  Fact Sheet for Patients: EntrepreneurPulse.com.au  Fact Sheet for Healthcare Providers: IncredibleEmployment.be  This test is not yet approved or cleared by the [place] FDA and has been authorized for detection and/or diagnosis of SARS-CoV-2 by FDA under an Emergency Use Authorization (EUA). This EUA will remain in effect (meaning this test can be used) for the duration of the COVID-19 declaration under Section 564(b)(1) of the Act, 21 U.S.C. section 360bbb-3(b)(1), unless the authorization is terminated or revoked.  Performed at [facility], [address] [place], [place] [address]   Blood culture (routine x 2)     Status: Abnormal   Collection Time: 05/15/21  8:53 PM   Specimen:  BLOOD  Result Value Ref Range Status   Specimen Description   Final    BLOOD RIGHT ANTECUBITAL Performed at [facility], [address] [place], [place] [address]    Special Requests   Final    BOTTLES DRAWN AEROBIC AND ANAEROBIC Blood Culture adequate volume Performed at [facility], [address], [place], [place] [address]    Culture  Setup Time   Final    GRAM NEGATIVE RODS ANAEROBIC BOTTLE ONLY aerobic bottle GNR Gram Stain Report Called to,Read Back By and Verified With: DAWKINS @ 1020 ON [id number] BY HENDERSON L CRITICAL RESULT CALLED TO, READ BACK BY AND VERIFIED WITH: G,COFFEE PHARMD @1328  05/16/21 EB IN BOTH AEROBIC AND ANAEROBIC BOTTLES Performed at [facility], [place] [address] [place],  [address]    Culture ESCHERICHIA COLI (A)  Final   Report Status 05/18/2021 FINAL  Final   Organism ID, Bacteria ESCHERICHIA COLI  Final      Susceptibility   Escherichia coli - MIC*    AMPICILLIN >=32 RESISTANT Resistant     CEFAZOLIN >=64 RESISTANT Resistant     CEFEPIME <=0.12 SENSITIVE Sensitive     CEFTAZIDIME <=1 SENSITIVE Sensitive     CEFTRIAXONE 1 SENSITIVE Sensitive     CIPROFLOXACIN <=0.25 SENSITIVE Sensitive     GENTAMICIN <=1 SENSITIVE Sensitive     IMIPENEM <=0.25 SENSITIVE Sensitive     TRIMETH/SULFA <=20 SENSITIVE Sensitive     AMPICILLIN/SULBACTAM >=32 RESISTANT Resistant     * ESCHERICHIA COLI  Blood Culture ID Panel (Reflexed)     Status: Abnormal   Collection Time: 05/15/21  8:53 PM  Result Value Ref Range Status   Enterococcus faecalis NOT DETECTED NOT DETECTED Final   Enterococcus Faecium NOT DETECTED NOT DETECTED Final   Listeria monocytogenes NOT DETECTED NOT DETECTED Final   Staphylococcus species NOT DETECTED NOT DETECTED Final   Staphylococcus aureus (BCID) NOT DETECTED NOT DETECTED Final   Staphylococcus epidermidis NOT DETECTED NOT DETECTED Final   Staphylococcus lugdunensis NOT DETECTED NOT DETECTED Final   Streptococcus species NOT  DETECTED NOT DETECTED Final   Streptococcus agalactiae NOT DETECTED NOT DETECTED Final   Streptococcus pneumoniae NOT DETECTED NOT DETECTED Final   Streptococcus pyogenes NOT DETECTED NOT DETECTED Final   A.calcoaceticus-baumannii NOT DETECTED NOT DETECTED Final   Bacteroides fragilis NOT DETECTED NOT DETECTED Final   Enterobacterales DETECTED (A) NOT DETECTED Final    Comment: Enterobacterales represent a large order of gram negative bacteria, not a single organism. CRITICAL RESULT CALLED TO, READ BACK BY AND VERIFIED WITH: G,COFFEE PHARMD @1328  05/16/21 EB    Enterobacter cloacae complex NOT DETECTED NOT DETECTED Final   Escherichia coli  DETECTED (A) NOT DETECTED Final    Comment: CRITICAL RESULT CALLED TO, READ BACK BY AND VERIFIED WITH: G,COFFEE PHARMD @1328  05/16/21 EB    Klebsiella aerogenes NOT DETECTED NOT DETECTED Final   Klebsiella oxytoca NOT DETECTED NOT DETECTED Final   Klebsiella pneumoniae NOT DETECTED NOT DETECTED Final   Proteus species NOT DETECTED NOT DETECTED Final   Salmonella species NOT DETECTED NOT DETECTED Final   Serratia marcescens NOT DETECTED NOT DETECTED Final   Haemophilus influenzae NOT DETECTED NOT DETECTED Final   Neisseria meningitidis NOT DETECTED NOT DETECTED Final   Pseudomonas aeruginosa NOT DETECTED NOT DETECTED Final   Stenotrophomonas maltophilia NOT DETECTED NOT DETECTED Final   Candida albicans NOT DETECTED NOT DETECTED Final   Candida auris NOT DETECTED NOT DETECTED Final   Candida glabrata NOT DETECTED NOT DETECTED Final   Candida krusei NOT DETECTED NOT DETECTED Final   Candida parapsilosis NOT DETECTED NOT DETECTED Final   Candida tropicalis NOT DETECTED NOT DETECTED Final   Cryptococcus neoformans/gattii NOT DETECTED NOT DETECTED Final   CTX-M ESBL NOT DETECTED NOT DETECTED Final   Carbapenem resistance IMP NOT DETECTED NOT DETECTED Final   Carbapenem resistance KPC NOT DETECTED NOT DETECTED Final   Carbapenem resistance NDM NOT  DETECTED NOT DETECTED Final   Carbapenem resist OXA 48 LIKE NOT DETECTED NOT DETECTED Final   Carbapenem resistance VIM NOT DETECTED NOT DETECTED Final    Comment: Performed at [facility], [address], [place], [place] [address]  Blood culture (routine x 2)     Status: Abnormal   Collection Time: 05/16/21 12:12 AM   Specimen: Right Antecubital; Blood  Result Value Ref Range Status   Specimen Description   Final    RIGHT ANTECUBITAL BOTTLES DRAWN AEROBIC ONLY Performed at [facility], [address], [place], [place] [address]    Special Requests   Final    Blood Culture adequate volume Performed at [facility], [address], [place], [place] [address]    Culture  Setup Time   Final    GRAM NEGATIVE RODS AEROBIC BOTTLE ONLY Gram Stain Report Called to,Read Back By and Verified With: DAWKINS N @ 1410 ON 05/16/21 BY HENDERSON L CRITICAL RESULT CALLED TO, READ BACK BY AND VERIFIED WITH: PHARMD JAMES LEDFORD 05/17/21@3 :28 BY TW Performed at [facility], [place] [address], [place], [place] [address]    Culture ESCHERICHIA COLI (A)  Final   Report Status 05/19/2021 FINAL  Final   Organism ID, Bacteria ESCHERICHIA COLI  Final      Susceptibility   Escherichia coli - MIC*    AMPICILLIN >=32 RESISTANT Resistant     CEFAZOLIN >=64 RESISTANT Resistant     CEFEPIME <=0.12 SENSITIVE Sensitive     CEFTAZIDIME <=1 SENSITIVE Sensitive     CEFTRIAXONE 0.5 SENSITIVE Sensitive     CIPROFLOXACIN <=0.25 SENSITIVE Sensitive     GENTAMICIN <=1 SENSITIVE Sensitive     IMIPENEM <=0.25 SENSITIVE Sensitive     TRIMETH/SULFA <=20 SENSITIVE Sensitive     AMPICILLIN/SULBACTAM >=32 RESISTANT Resistant     PIP/TAZO 8 SENSITIVE Sensitive     * ESCHERICHIA COLI  Blood Culture ID Panel (Reflexed)     Status: Abnormal   Collection Time: 05/16/21 12:12 AM  Result Value Ref Range Status   Enterococcus faecalis NOT DETECTED NOT DETECTED Final   Enterococcus Faecium NOT DETECTED NOT DETECTED  Final   Listeria monocytogenes NOT DETECTED NOT DETECTED Final   Staphylococcus species NOT DETECTED NOT  DETECTED Final   Staphylococcus aureus (BCID) NOT DETECTED NOT DETECTED Final   Staphylococcus epidermidis NOT DETECTED NOT DETECTED Final   Staphylococcus lugdunensis NOT DETECTED NOT DETECTED Final   Streptococcus species NOT DETECTED NOT DETECTED Final   Streptococcus agalactiae NOT DETECTED NOT DETECTED Final   Streptococcus pneumoniae NOT DETECTED NOT DETECTED Final   Streptococcus pyogenes NOT DETECTED NOT DETECTED Final   A.calcoaceticus-baumannii NOT DETECTED NOT DETECTED Final   Bacteroides fragilis NOT DETECTED NOT DETECTED Final   Enterobacterales DETECTED (A) NOT DETECTED Final    Comment: Enterobacterales represent a large order of gram negative bacteria, not a single organism. CRITICAL RESULT CALLED TO, READ BACK BY AND VERIFIED WITH: PHARMD JAMES LEDFORD 05/17/21@3 :28 BY TW    Enterobacter cloacae complex NOT DETECTED NOT DETECTED Final   Escherichia coli DETECTED (A) NOT DETECTED Final    Comment: CRITICAL RESULT CALLED TO, READ BACK BY AND VERIFIED WITH: PHARMD JAMES LEDFORD 05/17/21@3 :28 BY TW    Klebsiella aerogenes NOT DETECTED NOT DETECTED Final   Klebsiella oxytoca NOT DETECTED NOT DETECTED Final   Klebsiella pneumoniae NOT DETECTED NOT DETECTED Final   Proteus species NOT DETECTED NOT DETECTED Final   Salmonella species NOT DETECTED NOT DETECTED Final   Serratia marcescens NOT DETECTED NOT DETECTED Final   Haemophilus influenzae NOT DETECTED NOT DETECTED Final   Neisseria meningitidis NOT DETECTED NOT DETECTED Final   Pseudomonas aeruginosa NOT DETECTED NOT DETECTED Final   Stenotrophomonas maltophilia NOT DETECTED NOT DETECTED Final   Candida albicans NOT DETECTED NOT DETECTED Final   Candida auris NOT DETECTED NOT DETECTED Final   Candida glabrata NOT DETECTED NOT DETECTED Final   Candida krusei NOT DETECTED NOT DETECTED Final   Candida parapsilosis NOT  DETECTED NOT DETECTED Final   Candida tropicalis NOT DETECTED NOT DETECTED Final   Cryptococcus neoformans/gattii NOT DETECTED NOT DETECTED Final   CTX-M ESBL NOT DETECTED NOT DETECTED Final   Carbapenem resistance IMP NOT DETECTED NOT DETECTED Final   Carbapenem resistance KPC NOT DETECTED NOT DETECTED Final   Carbapenem resistance NDM NOT DETECTED NOT DETECTED Final   Carbapenem resist OXA 48 LIKE NOT DETECTED NOT DETECTED Final   Carbapenem resistance VIM NOT DETECTED NOT DETECTED Final    Comment: Performed at [facility], [address], [place], [place] [address]  MRSA Next Gen by PCR, Nasal     Status: None   Collection Time: 05/16/21  2:43 AM   Specimen: Nasal Mucosa; Nasal Swab  Result Value Ref Range Status   MRSA by PCR Next Gen NOT DETECTED NOT DETECTED Final    Comment: (NOTE) The GeneXpert MRSA Assay (FDA approved for NASAL specimens only), is one component of a comprehensive MRSA colonization surveillance program. It is not intended to diagnose MRSA infection nor to guide or monitor treatment for MRSA infections. Test performance is not FDA approved in patients less than 50 years old. Performed at [facility], [place] [address] [place], [place] [address]      Labs:  COVID-19 Labs   Lab Results  Component Value Date   SARSCOV2NAA POSITIVE (A) 05/15/2021   [place] NEGATIVE 04/27/2021   [place] NEGATIVE 12/29/2019   [place] NEGATIVE 12/11/2019      Basic Metabolic Panel: Recent Labs  Lab 05/19/21 0503 05/20/21 0444 05/21/21 0632 05/22/21 0614 05/23/21 0227 05/25/21 0704  NA 135 135 133* 133* 131* 136  K 3.6 3.5 4.3 4.1 4.3 4.2  CL 104 104 102 103 99 104  CO2 26  26 25 25 27 25   GLUCOSE 109* 125* 105* 89 94 86  BUN 11 12 12 13 12 10   CREATININE 0.62 0.77 0.59 0.62 0.53 0.67  CALCIUM 7.4* 7.5* 7.6* 7.7* 7.8* 7.9*  MG 1.6*  --  1.2* 1.8 1.4* 1.3*    CBC: Recent Labs  Lab 05/20/21 0444 05/21/21 0632 05/22/21 0614  05/23/21 0227 05/25/21 0837  WBC 7.2 6.7 8.2 7.8 7.8  HGB 8.5* 9.5* 8.9* 9.7* 8.9*  HCT 26.0* 30.1* 27.9* 30.6* 28.0*  MCV 88.7 89.9 89.7 89.5 89.5  PLT 126* 130* 133* 173 PLATELET CLUMPS NOTED ON SMEAR, UNABLE TO ESTIMATE    BNP: BNP (last 3 results) Recent Labs    05/15/21 1658  BNP 121.0*    CBG: Recent Labs  Lab 05/24/21 2054 05/24/21 2315 05/25/21 0608 05/25/21 0953 05/25/21 1118  GLUCAP 134* 140* 72 69* 103*     IMAGING STUDIES DG Chest 1 View  Result Date: 04/27/2021 CLINICAL DATA:  [place] 2 weeks ago EXAM: CHEST  1 VIEW COMPARISON:  07/28/2020 FINDINGS: Power port on the right with the tip in the right atrium, possibly approaching the tricuspid valve. No evidence of heart failure. Patient has taken a poor inspiration. No traumatic regional finding. IMPRESSION: Poor inspiration. No traumatic finding. Power port tip in the right atrium, possibly near the tricuspid valve. Electronically Signed   By: Nelson Chimes M.D.   On: 04/27/2021 13:18   DG Lumbar Spine 2-3 Views  Result Date: 04/29/2021 CLINICAL DATA:  Low back and left hip pain EXAM: LUMBAR SPINE - 2-3 VIEW COMPARISON:  CT 04/27/2021 FINDINGS: Limited resolution. Five lumbar type vertebral bodies do not show any visible malalignment. There is degenerative disc disease and degenerative facet disease throughout the lumbar region. Bilateral sacroiliac degenerated changes noted. Advanced chronic arthropathy of the hips, left worse than right. IMPRESSION: Limited imaging. 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 Bilateral sacroiliac arthritis. Electronically Signed   By: Nelson Chimes M.D.   On: 04/29/2021 09:52   DG Knee 1-2 Views Left  Result Date: 04/27/2021 CLINICAL DATA:  Fill 2 weeks ago.  Pain and limited range of motion. EXAM: LEFT KNEE - 1-2 VIEW COMPARISON:  None. FINDINGS: There is tricompartmental osteoarthritis with a joint effusion. No evidence of fracture or  focal bone lesion. IMPRESSION: Osteoarthritis and joint effusion. No traumatic bone finding visible. Electronically Signed   By: Nelson Chimes M.D.   On: 04/27/2021 13:16   CT Angio Chest PE W and/or Wo Contrast  Result Date: 04/27/2021 CLINICAL DATA:  PE suspected, high probability. LEFT leg pain and weakness status post fall 2 weeks ago. History of metastatic LEFT breast cancer, per clinical data provided on previous CT report. EXAM: CT ANGIOGRAPHY CHEST CT ABDOMEN AND PELVIS WITH CONTRAST TECHNIQUE: Multidetector CT imaging of the chest was performed using the standard protocol during bolus administration of intravenous contrast. Multiplanar CT image reconstructions and MIPs were obtained to evaluate the vascular anatomy. Multidetector CT imaging of the abdomen and pelvis was performed using the standard protocol during bolus administration of intravenous contrast. CONTRAST:  197mL OMNIPAQUE IOHEXOL 350 MG/ML SOLN COMPARISON:  CT chest abdomen and pelvis dated 04/09/2021. FINDINGS: CTA CHEST FINDINGS Cardiovascular: Some of the most peripheral subsegmental pulmonary arteries are difficult to definitively characterize due to patient breathing motion artifact, however, there is no pulmonary embolism seen within the main, lobar or central segmental pulmonary arteries bilaterally. No thoracic aortic aneurysm or evidence of aortic dissection. No  pericardial effusion. Mediastinum/Nodes: Mediastinal and perihilar lymphadenopathy, including a 1.2 cm short axis lymph node within the anterior mediastinum and a 1.6 cm short axis lymph node within the RIGHT lower paratracheal mediastinum, not significantly changed compared to the recent chest CT of 04/09/2021, compatible with metastatic lymphadenopathy. Additional conglomerate lymphadenopathy is also again seen within the LEFT supraclavicular region, incompletely imaged. z Esophagus is unremarkable. Trachea and central bronchi are unremarkable. Lungs/Pleura: Lungs are  clear.  No pleural effusion or pneumothorax. Musculoskeletal: No acute appearing osseous abnormality. Lytic-appearing lesion within the T6 vertebral body is suspicious for osseous metastasis. Additional destructive/lytic changes within the sternum, also suggesting metastatic disease. Conglomerate lymphadenopathy within the LEFT axilla, and mass/lymphadenopathy in the axillary tail region of the LEFT breast measures 4 cm greatest dimension, all of which is not significantly changed compared to the recent chest CT of 04/09/2021. Additional milder lymphadenopathy within the RIGHT axilla is redemonstrated and stable. Review of the MIP images confirms the above findings. CT ABDOMEN and PELVIS FINDINGS Hepatobiliary: Numerous small hypodense lesions within the bilateral liver lobes, as also described on the earlier CT abdomen report of 12/12/2019, presumably numerous small liver metastases. Single stone within the otherwise normal-appearing gallbladder. No bile duct dilatation is seen. Pancreas: Unremarkable. No pancreatic ductal dilatation or surrounding inflammatory changes. Spleen: Normal in size without focal abnormality. Adrenals/Urinary Tract: Adrenals are unremarkable. Bilateral renal cysts. Kidneys are otherwise unremarkable without suspicious mass, stone or hydronephrosis. No ureteral or bladder calculi are identified. Bladder is unremarkable. Stomach/Bowel: No dilated large or small bowel loops. Scattered diverticulosis of the descending and sigmoid colon but no focal inflammatory change to suggest acute diverticulitis. No evidence of acute bowel wall inflammation. Stomach is unremarkable. Appendix is normal. Vascular/Lymphatic: Vascular structures of the abdomen and pelvis are unremarkable. Conglomerate lymphadenopathy above the pancreatic head and adjacent to the porta hepatis, as previously described, presumed lymph node metastases. Reproductive: Calcified uterine fibroids. No adnexal mass or free fluid.  Other: No free fluid or abscess collection. No free intraperitoneal air. Musculoskeletal: No acute findings. Degenerative spondylosis of the lumbar spine, mild to moderate in degree. 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 Review of the MIP images confirms the above findings. IMPRESSION: 1. No acute findings within the chest. No pulmonary embolism is seen, with mild study limitations detailed above. No evidence of pneumonia or pulmonary edema. 2. Metastatic lymphadenopathy is redemonstrated within the mediastinum, bilateral axillae, and supraclavicular LEFT neck. Also redemonstrated is the mass versus conglomerate lymphadenopathy in the axillary tail region of the LEFT breast. These findings are stable compared to the recent chest CT of 04/09/2021. 3. Lytic-appearing lesion within the T6 vertebral body, and destructive/lytic changes within the sternum, suspicious for metastatic osseous disease. Consider nuclear medicine bone scan or PET scan for confirmation. 4. Numerous small hypodense lesions within the bilateral liver lobes, as also described on the earlier CT abdomen report of 12/12/2019, presumably liver metastases. 5. Cholelithiasis without evidence of acute cholecystitis. 6. Colonic diverticulosis without evidence of acute diverticulitis. 7. No acute findings within the abdomen or pelvis. No bowel obstruction or evidence of acute bowel wall inflammation. No free fluid or abscess collection. No evidence of acute solid organ abnormality. 8. No evidence of acute osseous fracture or dislocation is seen. Electronically Signed   By: Franki Cabot M.D.   On: 04/27/2021 17:09   CT Knee Left Wo Contrast  Result Date: 04/27/2021 CLINICAL DATA:  Fall 2 weeks ago comment limited range of motion EXAM: CT OF THE LEFT KNEE WITHOUT  CONTRAST TECHNIQUE: Multidetector CT imaging of the left knee was performed according to the standard protocol. Multiplanar CT image reconstructions were also generated. COMPARISON:  Radiographs  04/27/2021 FINDINGS: Bones/Joint/Cartilage The knee was imaged in a moderately flexed orientation. There is marked proliferative spurring all 3 compartments with associated cortical irregularity which can reduce sensitivity for subtle fractures. I do not see a well-defined cortical discontinuity to indicate fracture. There is a moderate knee effusion in the suprapatellar bursa. Ligaments Suboptimally assessed by CT. Muscles and Tendons Mild regional muscular atrophy diffusely. Soft tissues Atherosclerosis. Subcutaneous edema anterior to the patella, cannot exclude prepatellar bruising. IMPRESSION: 1. 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 No fracture is identified, although sensitivity for subtle fractures is reduced due to the degree of spurring and associated cortical irregularities. 2. Moderate knee effusion. 3. Atherosclerosis. 4. Subcutaneous edema anterior to the patella. Electronically Signed   By: Van Clines M.D.   On: 04/27/2021 17:15   CT ABDOMEN PELVIS W CONTRAST  Result Date: 04/27/2021 CLINICAL DATA:  PE suspected, high probability. LEFT leg pain and weakness status post fall 2 weeks ago. History of metastatic LEFT breast cancer, per clinical data provided on previous CT report. EXAM: CT ANGIOGRAPHY CHEST CT ABDOMEN AND PELVIS WITH CONTRAST TECHNIQUE: Multidetector CT imaging of the chest was performed using the standard protocol during bolus administration of intravenous contrast. Multiplanar CT image reconstructions and MIPs were obtained to evaluate the vascular anatomy. Multidetector CT imaging of the abdomen and pelvis was performed using the standard protocol during bolus administration of intravenous contrast. CONTRAST:  124mL OMNIPAQUE IOHEXOL 350 MG/ML SOLN COMPARISON:  CT chest abdomen and pelvis dated 04/09/2021. FINDINGS: CTA CHEST FINDINGS Cardiovascular: Some of the most peripheral subsegmental pulmonary arteries are difficult to definitively  characterize due to patient breathing motion artifact, however, there is no pulmonary embolism seen within the main, lobar or central segmental pulmonary arteries bilaterally. No thoracic aortic aneurysm or evidence of aortic dissection. No pericardial effusion. Mediastinum/Nodes: Mediastinal and perihilar lymphadenopathy, including a 1.2 cm short axis lymph node within the anterior mediastinum and a 1.6 cm short axis lymph node within the RIGHT lower paratracheal mediastinum, not significantly changed compared to the recent chest CT of 04/09/2021, compatible with metastatic lymphadenopathy. Additional conglomerate lymphadenopathy is also again seen within the LEFT supraclavicular region, incompletely imaged. z Esophagus is unremarkable. Trachea and central bronchi are unremarkable. Lungs/Pleura: Lungs are clear.  No pleural effusion or pneumothorax. Musculoskeletal: No acute appearing osseous abnormality. Lytic-appearing lesion within the T6 vertebral body is suspicious for osseous metastasis. Additional destructive/lytic changes within the sternum, also suggesting metastatic disease. Conglomerate lymphadenopathy within the LEFT axilla, and mass/lymphadenopathy in the axillary tail region of the LEFT breast measures 4 cm greatest dimension, all of which is not significantly changed compared to the recent chest CT of 04/09/2021. Additional milder lymphadenopathy within the RIGHT axilla is redemonstrated and stable. Review of the MIP images confirms the above findings. CT ABDOMEN and PELVIS FINDINGS Hepatobiliary: Numerous small hypodense lesions within the bilateral liver lobes, as also described on the earlier CT abdomen report of 12/12/2019, presumably numerous small liver metastases. Single stone within the otherwise normal-appearing gallbladder. No bile duct dilatation is seen. Pancreas: Unremarkable. No pancreatic ductal dilatation or surrounding inflammatory changes. Spleen: Normal in size without focal  abnormality. Adrenals/Urinary Tract: Adrenals are unremarkable. Bilateral renal cysts. Kidneys are otherwise unremarkable without suspicious mass, stone or hydronephrosis. No ureteral or bladder calculi are identified. Bladder is unremarkable. Stomach/Bowel: No dilated large or small  bowel loops. Scattered diverticulosis of the descending and sigmoid colon but no focal inflammatory change to suggest acute diverticulitis. No evidence of acute bowel wall inflammation. Stomach is unremarkable. Appendix is normal. Vascular/Lymphatic: Vascular structures of the abdomen and pelvis are unremarkable. Conglomerate lymphadenopathy above the pancreatic head and adjacent to the porta hepatis, as previously described, presumed lymph node metastases. Reproductive: Calcified uterine fibroids. No adnexal mass or free fluid. Other: No free fluid or abscess collection. No free intraperitoneal air. Musculoskeletal: No acute findings. Degenerative spondylosis of the lumbar spine, mild to moderate in degree. 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 Review of the MIP images confirms the above findings. IMPRESSION: 1. No acute findings within the chest. No pulmonary embolism is seen, with mild study limitations detailed above. No evidence of pneumonia or pulmonary edema. 2. Metastatic lymphadenopathy is redemonstrated within the mediastinum, bilateral axillae, and supraclavicular LEFT neck. Also redemonstrated is the mass versus conglomerate lymphadenopathy in the axillary tail region of the LEFT breast. These findings are stable compared to the recent chest CT of 04/09/2021. 3. Lytic-appearing lesion within the T6 vertebral body, and destructive/lytic changes within the sternum, suspicious for metastatic osseous disease. Consider nuclear medicine bone scan or PET scan for confirmation. 4. Numerous small hypodense lesions within the bilateral liver lobes, as also described on the earlier CT abdomen report of 12/12/2019, presumably liver metastases.  5. Cholelithiasis without evidence of acute cholecystitis. 6. Colonic diverticulosis without evidence of acute diverticulitis. 7. No acute findings within the abdomen or pelvis. No bowel obstruction or evidence of acute bowel wall inflammation. No free fluid or abscess collection. No evidence of acute solid organ abnormality. 8. No evidence of acute osseous fracture or dislocation is seen. Electronically Signed   By: Franki Cabot M.D.   On: 04/27/2021 17:09   CT Hip Left Wo Contrast  Result Date: 04/27/2021 CLINICAL DATA:  Hip trauma, fracture suspected. EXAM: CT OF THE LEFT HIP WITHOUT CONTRAST TECHNIQUE: Multidetector CT imaging of the left hip was performed according to the standard protocol. Multiplanar CT image reconstructions were also generated. COMPARISON:  None. FINDINGS: No evidence of acute fracture or dislocation at the LEFT hip. 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 Deformity of the LEFT humeral head/neck is compatible with previous injury versus chronic deformity related to the overlying degenerative joint disease. Visualized osseous structures of the LEFT hemipelvis appear intact and normally aligned. Additional degenerative change noted at the LEFT SI joint and within the lower lumbar spine. Visualized soft tissues about the LEFT hip are unremarkable. IMPRESSION: 1. No evidence of acute fracture or dislocation at the LEFT hip. 2. 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 Electronically Signed   By: Franki Cabot M.D.   On: 04/27/2021 17:13   IR REMOVAL TUN ACCESS W/ PORT W/O FL MOD SED  Result Date: 05/20/2021 CLINICAL DATA:  70 year old female with history of breast cancer and recent bacteremia. Port removal requested. EXAM: REMOVAL OF IMPLANTED TUNNELED PORT-A-CATH MEDICATIONS: None. ANESTHESIA/SEDATION: Moderate  (conscious) sedation was employed during this procedure. A total of Versed 2 mg and Fentanyl 50 mcg was administered intravenously. Moderate Sedation Time: 20 minutes. The patient's level of consciousness and vital signs were monitored continuously by radiology nursing throughout the procedure under my direct supervision. FLUOROSCOPY TIME:  None PROCEDURE: Informed written consent was obtained from the patient after a discussion of the  risk, benefits and alternatives to the procedure. The patient was positioned supine on the fluoroscopy table and the right chest Port-A-Cath site was prepped with chlorhexidine. A sterile gown and gloves were worn during the procedure. Local anesthesia was provided with 1% lidocaine with epinephrine. A timeout was performed prior to the initiation of the procedure. An incision was made overlying the Port-A-Cath with a #15 scalpel. Utilizing sharp and blunt dissection, the Port-A-Cath was removed completely. The pocked was irrigated with sterile saline. Wound closure was performed with deep dermal 2-0 and Dermabond. A dressing was placed. The patient tolerated the procedure well without immediate post procedural complication. FINDINGS: Successful removal of implant Port-A-Cath without immediate post procedural complication. IMPRESSION: Successful removal of implanted Port-A-Cath. Ruthann Cancer, MD [facility] Electronically Signed   By: Ruthann Cancer M.D.   On: 05/20/2021 13:56   DG Chest Port 1 View  Result Date: 05/16/2021 CLINICAL DATA:  Short of breath.  COVID-19. EXAM: PORTABLE CHEST 1 VIEW COMPARISON:  04/29/2021 and older exams. FINDINGS: Cardiac silhouette normal in size.  No mediastinal or hilar masses. Stable elevation of the right hemidiaphragm. Lungs are clear. No convincing pleural effusion and no pneumothorax. Right internal jugular Port-A-Cath is also stable, tip projecting in the right atrium. IMPRESSION: 1.  No acute cardiopulmonary disease. Electronically Signed   By: Lajean Manes M.D.   On: 05/16/2021 09:11   DG CHEST PORT 1 VIEW  Result Date: 04/29/2021 CLINICAL DATA:  Leukocytosis, atrial tachycardia, history of breast cancer EXAM: PORTABLE CHEST 1 VIEW COMPARISON:  04/27/2021 FINDINGS: Right chest wall port catheter is unchanged. Persistent elevation of the right hemidiaphragm. No new consolidation or edema. No pleural effusion. Stable cardiomediastinal contours. IMPRESSION: No acute process in the chest. Electronically Signed   By: Macy Mis M.D.   On: 04/29/2021 08:05   DG Knee Left Port  Result Date: 05/21/2021 CLINICAL DATA:  Swollen knee EXAM: PORTABLE LEFT KNEE - 1-2 VIEW COMPARISON:  None. FINDINGS: Patient not able to fully cooperate with positioning. Suboptimal projections. 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 No acute fracture or mass lesion.  No joint effusion IMPRESSION: Tricompartmental degenerative change.  No acute abnormality. Electronically Signed   By: Franchot Gallo M.D.   On: 05/21/2021 11:12   ECHOCARDIOGRAM COMPLETE  Result Date: 04/30/2021    ECHOCARDIOGRAM REPORT   Patient Name:   DAYRIN STALLONE Date of Exam: 04/30/2021 Medical Rec #:  [id number]       Height:       63.0 in Accession #:    [id number]      Weight:       257.1 lb Date of Birth:  1951/05/20       BSA:          2.151 m Patient Age:    44 years        BP:           112/70 mmHg Patient Gender: F               HR:           84 bpm. Exam Location:  [facility] Procedure: 2D Echo, Cardiac Doppler and Color Doppler Indications:    Bacteremia  History:        Patient has prior history of Echocardiogram examinations, most                 recent 09/20/1942.  Signs/Symptoms:Chest Pain; Risk                 Factors:Hypertension and Diabetes. Port-a-cath access only, no                 IV, Breast CA,.  Sonographer:    Wenda Low  Referring Phys: [id number] COURAGE EMOKPAE  Sonographer Comments: Patient is morbidly obese and Technically difficult study due to poor echo windows. IMPRESSIONS  1. Left ventricular ejection fraction, by estimation, is 60 to 65%. The left ventricle has normal function. Left ventricular endocardial border not optimally defined to evaluate regional wall motion. There is moderate left ventricular hypertrophy. Left ventricular diastolic parameters are indeterminate.  2. Right ventricular systolic function is normal. The right ventricular size is mildly enlarged. There is normal pulmonary artery systolic pressure. The estimated right ventricular systolic pressure is 44.8 mmHg.  3. The mitral valve is grossly normal. Trivial mitral valve regurgitation.  4. The aortic valve is tricuspid. Aortic valve regurgitation is not visualized. Aortic valve sclerosis is present, with no evidence of aortic valve stenosis. Aortic valve mean gradient measures 3.0 mmHg.  5. The inferior vena cava is normal in size with greater than 50% respiratory variability, suggesting right atrial pressure of 3 mmHg. Comparison(s): No significant change from prior study. Prior images reviewed side by side. FINDINGS  Left Ventricle: Left ventricular ejection fraction, by estimation, is 60 to 65%. The left ventricle has normal function. Left ventricular endocardial border not optimally defined to evaluate regional wall motion. The left ventricular internal cavity size was normal in size. There is moderate left ventricular hypertrophy. Left ventricular diastolic parameters are indeterminate. Right Ventricle: The right ventricular size is mildly enlarged. No increase in right ventricular wall thickness. Right ventricular systolic function is normal. There is normal pulmonary artery systolic pressure. The tricuspid regurgitant velocity is 2.64  m/s, and with an assumed right atrial pressure of 3 mmHg, the estimated right ventricular systolic pressure is 18.5  mmHg. Left Atrium: Left atrial size was normal in size. Right Atrium: Right atrial size was normal in size. Pericardium: There is no evidence of pericardial effusion. Mitral Valve: The mitral valve is grossly normal. Trivial mitral valve regurgitation. MV peak gradient, 3.7 mmHg. The mean mitral valve gradient is 2.0 mmHg. Tricuspid Valve: The tricuspid valve is grossly normal. Tricuspid valve regurgitation is mild. Aortic Valve: The aortic valve is tricuspid. There is mild aortic valve annular calcification. Aortic valve regurgitation is not visualized. Aortic valve sclerosis is present, with no evidence of aortic valve stenosis. Aortic valve mean gradient measures  3.0 mmHg. Aortic valve peak gradient measures 4.9 mmHg. Aortic valve area, by VTI measures 2.32 cm. Pulmonic Valve: The pulmonic valve was grossly normal. Pulmonic valve regurgitation is trivial. Aorta: The aortic root is normal in size and structure. Venous: The inferior vena cava is normal in size with greater than 50% respiratory variability, suggesting right atrial pressure of 3 mmHg. IAS/Shunts: No atrial level shunt detected by color flow Doppler.  LEFT VENTRICLE PLAX 2D LVIDd:         3.70 cm LVIDs:         2.50 cm LV PW:         1.40 cm LV IVS:        1.30 cm LVOT diam:     2.00 cm LV SV:         61 LV SV Index:   28 LVOT Area:     3.14 cm  RIGHT VENTRICLE RV  Basal diam:  3.45 cm RV Mid diam:    3.70 cm RV S prime:     11.70 cm/s LEFT ATRIUM           Index        RIGHT ATRIUM           Index LA diam:      3.00 cm 1.39 cm/m   RA Area:     12.60 cm LA Vol (A4C): 26.8 ml 12.46 ml/m  RA Volume:   31.80 ml  14.78 ml/m  AORTIC VALVE                    PULMONIC VALVE AV Area (Vmax):    2.33 cm     PV Vmax:       0.86 m/s AV Area (Vmean):   2.06 cm     PV Peak grad:  3.0 mmHg AV Area (VTI):     2.32 cm AV Vmax:           111.00 cm/s AV Vmean:          81.400 cm/s AV VTI:            0.263 m AV Peak Grad:      4.9 mmHg AV Mean Grad:      3.0  mmHg LVOT Vmax:         82.20 cm/s LVOT Vmean:        53.400 cm/s LVOT VTI:          0.194 m LVOT/AV VTI ratio: 0.74  AORTA Ao Root diam: 3.10 cm Ao Asc diam:  3.10 cm MITRAL VALVE               TRICUSPID VALVE MV Area (PHT): 5.93 cm    TR Peak grad:   27.9 mmHg MV Area VTI:   2.83 cm    TR Vmax:        264.00 cm/s MV Peak grad:  3.7 mmHg MV Mean grad:  2.0 mmHg    SHUNTS MV Vmax:       0.96 m/s    Systemic VTI:  0.19 m MV Vmean:      67.4 cm/s   Systemic Diam: 2.00 cm MV Decel Time: 128 msec MV E velocity: 94.30 cm/s MV A velocity: 91.30 cm/s MV E/A ratio:  1.03 Rozann Lesches MD Electronically signed by Rozann Lesches MD Signature Date/Time: 04/30/2021/10:53:15 AM    Final    DG HIP UNILAT WITH PELVIS 2-3 VIEWS LEFT  Result Date: 05/18/2021 CLINICAL DATA:  Chronic left hip pain. EXAM: DG HIP (WITH OR WITHOUT PELVIS) 2-3V LEFT COMPARISON:  None. FINDINGS: There is no evidence of an acute hip fracture or dislocation. Marked severity chronic and degenerative changes are seen in the form of joint space narrowing, acetabular sclerosis and subchondral cyst formation. Lobulated, calcified uterine fibroids are suspected. IMPRESSION: Marked severity chronic and degenerative changes in the left hip. Electronically Signed   By: Virgina Norfolk M.D.   On: 05/18/2021 20:46   DG Hip Unilat W or Wo Pelvis 2-3 Views Left  Result Date: 04/27/2021 CLINICAL DATA:  [place] 2 weeks ago.  Pain. EXAM: DG HIP (WITH OR WITHOUT PELVIS) 2-3V LEFT COMPARISON:  None. FINDINGS: There is advanced osteoarthritis of the left hip with joint space narrowing, sclerosis, osteophyte and flattening of femoral head. No definite acute regional fracture. 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 IMPRESSION: Advanced osteoarthritis of the left hip. No visible traumatic  finding. Flattening of the humeral head which is probably chronic. Electronically Signed   By: Nelson Chimes M.D.   On: 04/27/2021 13:17    DISCHARGE  EXAMINATION: Vitals:   05/25/21 0100 05/25/21 0120 05/25/21 0141 05/25/21 0951  BP:  119/64  108/81  Pulse: 84 82 82 88  Resp: 18 15 18 17   Temp:    98.1 F (36.7 C)  TempSrc:    Oral  SpO2: 98% 100% 100% 100%  Weight:   121.5 kg   Height:       General appearance: Awake alert.  In no distress Resp: Clear to auscultation bilaterally.  Normal effort Cardio: S1-S2 is normal regular.  No S3-S4.  No rubs murmurs or bruit GI: Abdomen is soft.  Nontender nondistended.  Bowel sounds are present normal.  No masses organomegaly   DISPOSITION: SNF  Discharge Instructions     Call MD for:  difficulty breathing, headache or visual disturbances   Complete by: As directed    Call MD for:  extreme fatigue   Complete by: As directed    Call MD for:  persistant dizziness or light-headedness   Complete by: As directed    Call MD for:  persistant nausea and vomiting   Complete by: As directed    Call MD for:  severe uncontrolled pain   Complete by: As directed    Call MD for:  temperature >100.4   Complete by: As directed    Discharge instructions   Complete by: As directed    Please review instructions on the discharge summary.  You were cared for by a hospitalist during your hospital stay. If you have any questions about your discharge medications or the care you received while you were in the hospital after you are discharged, you can call the unit and asked to speak with the hospitalist on call if the hospitalist that took care of you is not available. Once you are discharged, your primary care physician will handle any further medical issues. Please note that NO REFILLS for any discharge medications will be authorized once you are discharged, as it is imperative that you return to your primary care physician (or establish a relationship with a primary care physician if you do not have one) for your aftercare needs so that they can reassess your need for medications and monitor your lab  values. If you do not have a primary care physician, you can call [phone] for a physician referral.   Discharge wound care:   Complete by: As directed    Wound care  Daily      Comments: Apply Santyl to left buttock wound Q day, then cover with moist gauze and foam dressing.  (Change foam dressing Q 3 days or PRN soiling.)   Increase activity slowly   Complete by: As directed           Allergies as of 05/25/2021   No Known Allergies      Medication List     STOP taking these medications    amoxicillin 500 MG capsule Commonly known as: AMOXIL   Gerhardt's butt cream Crea   losartan 50 MG tablet Commonly known as: COZAAR       TAKE these medications    acetaminophen 325 MG tablet Commonly known as: TYLENOL Take 2 tablets (650 mg total) by mouth every 6 (six) hours as needed for mild pain (or Fever >/= 101).   apixaban 5 MG Tabs tablet Commonly known as: Eliquis Take 1  tablet (5 mg total) by mouth 2 (two) times daily.   Ensure Max Protein Liqd Take 330 mLs (11 oz total) by mouth daily.   nutrition supplement (JUVEN) Pack Take 1 packet by mouth 2 (two) times daily between meals.   fentaNYL 50 MCG/HR Commonly known as: [place] 1 patch onto the skin every 3 (three) days.   ferrous sulfate 325 (65 FE) MG tablet Take 325 mg by mouth daily with breakfast.   folic acid 1 MG tablet Commonly known as: FOLVITE TAKE 1 TABLET BY MOUTH EVERY DAY   gabapentin 300 MG capsule Commonly known as: NEURONTIN Take 1 capsule (300 mg total) by mouth 2 (two) times daily. What changed: when to take this   HYDROmorphone 2 MG tablet Commonly known as: Dilaudid Take 1 tablet (2 mg total) by mouth every 4 (four) hours as needed for severe pain. What changed: when to take this   Klor-Con M10 10 MEQ tablet Generic drug: potassium chloride Take 10 mEq by mouth daily.   levofloxacin 750 MG tablet Commonly known as: LEVAQUIN Take 1 tablet (750 mg total) by mouth  daily at 6 PM for 3 days.   lidocaine 5 % Commonly known as: LIDODERM Place 1 patch onto the skin daily. Remove & Discard patch within 12 hours or as directed by MD   lidocaine-prilocaine cream Commonly known as: EMLA Apply 1 application topically as needed. What changed:  when to take this reasons to take this   magnesium oxide 400 (240 Mg) MG tablet Commonly known as: MAG-OX Take 2 tablets (800 mg total) by mouth 2 (two) times daily.   megestrol 40 MG tablet Commonly known as: MEGACE Take 2 tablets (80 mg total) by mouth every 8 (eight) hours.   metFORMIN 500 MG tablet Commonly known as: GLUCOPHAGE Take 500 mg by mouth 2 (two) times daily with a meal.   metoprolol succinate 25 MG 24 hr tablet Commonly known as: Toprol XL Take 1 tablet (25 mg total) by mouth daily.   multivitamin with minerals Tabs tablet Take 1 tablet by mouth daily. Start taking on: May 26, 2021   ondansetron 4 MG tablet Commonly known as: Zofran Take 1 tablet (4 mg total) by mouth daily as needed for nausea or vomiting.   pantoprazole 40 MG tablet Commonly known as: PROTONIX Take 1 tablet (40 mg total) by mouth daily.   polyethylene glycol 17 g packet Commonly known as: MIRALAX / GLYCOLAX Take 17 g by mouth 2 (two) times daily.   pravastatin 10 MG tablet Commonly known as: PRAVACHOL Take 10 mg by mouth at bedtime.   senna-docusate 8.6-50 MG tablet Commonly known as: Senokot-S Take 2 tablets by mouth at bedtime.   vitamin C 100 MG tablet Take 100 mg by mouth in the morning and at bedtime.               Discharge Care Instructions  (From admission, onward)           Start     Ordered   05/25/21 0000  Discharge wound care:       Comments: Wound care  Daily      Comments: Apply Santyl to left buttock wound Q day, then cover with moist gauze and foam dressing.  (Change foam dressing Q 3 days or PRN soiling.)   05/25/21 1207              Follow-up Information      [facility] Follow up  in 2 week(s).   Specialty: Obstetrics and Gynecology Why: Dr. Elonda Husky or Nelda Marseille f/u [facility], has seen [facility] previously Contact information: [address] [place]        Lemmie Evens, MD. Schedule an appointment as soon as possible for a visit in 1 week(s).   Specialty: Family Medicine Contact information: [place] [address] [phone]                 TOTAL DISCHARGE TIME: 63 minutes  [place]  [facility] Pager on [email or web address]  05/25/2021, 12:07 PM

## 2021-05-26 LAB — GLUCOSE, CAPILLARY
Glucose-Capillary: 127 mg/dL — ABNORMAL HIGH (ref 70–99)
Glucose-Capillary: 138 mg/dL — ABNORMAL HIGH (ref 70–99)
Glucose-Capillary: 82 mg/dL (ref 70–99)

## 2021-05-26 LAB — SURGICAL PATHOLOGY

## 2021-05-26 MED ORDER — APIXABAN 5 MG PO TABS: 5.0000 mg | ORAL_TABLET | Freq: Two times a day (BID) | ORAL | Status: DC

## 2021-05-26 NOTE — Progress Notes (Signed)
Attempted x2 to give report no answer. Pt picked up by PTAR.

## 2021-05-26 NOTE — Discharge Instructions (Addendum)
[facility] Stay Proper nutrition can help your body recover from illness and injury.   Foods and beverages high in protein, vitamins, and minerals help rebuild muscle loss, promote healing, & reduce fall risk.   In addition to eating healthy foods, a nutrition shake is an easy, delicious way to get the nutrition you need during and after your hospital stay  It is recommended that you continue to drink 3 bottles per day of: Ensure for at least 1 month (30 days) after your hospital stay   Tips for adding a nutrition shake into your routine: As allowed, drink one with vitamins or medications instead of water or juice Enjoy one as a tasty mid-morning or afternoon snack Drink cold or make a milkshake out of it Drink one instead of milk with cereal or snacks Use as a coffee creamer   Available at the following grocery stores and pharmacies:           * [place] [phone]            For COUPONS visit: www.ensure.com/join or [email or web address]-[email or web address]   Suggested Substitutions Ensure Plus = Boost Plus = Carnation Breakfast Essentials = Boost Compact Ensure Active Clear = Boost Breeze Glucerna Shake = Boost Glucose Control = Carnation Breakfast Essentials SUGAR FREE

## 2021-05-26 NOTE — Care Management Important Message (Signed)
Important Message  Patient Details  Name: Crystal Vega MRN: [id number] Date of Birth: 1951-05-22   Medicare Important Message Given:  Yes     Hannah Beat 05/26/2021, 1:35 PM

## 2021-05-26 NOTE — Progress Notes (Signed)
Physical Therapy Treatment Patient Details Name: Crystal Vega MRN: [id number] DOB: 1951-01-03 Today's Date: 05/26/2021   History of Present Illness Patient is a 70 yo female admitted to [facility] ED on 12/16 with ABLA.  transfer to [facility] on 12/17 due to hypotension with incr medical issues.  Also positive for COVID.   Pt was at [facility] since early December after a fall.  Patient reports more fatigued and some vaginal bleeding for about 2 weeks. Admitted with hemorrhagic and septic shock, E.Coli bacteremia. s/p D&C of the uterus 12/21 and  Port removal 12/21. PMH of breast cancer undergoing chemo, colonic adenocarcinoma, iron deficiency anemia, DVT on Eliquis    PT Comments    Pt demos improved motivation to participate today and is able to sit up EOB with mod A.  Pt expresses desire to work on standing and transfer to chair, but when provided with opportunity is reluctant to complete activity.  Appears to be self limited by fear.  Use of sara stedy for first sit > stand trial may help pt overcome anxiety of trialing OOB activity.  Pt's L side is stiff but pt c/o pain when PT attempts to assist with ROM.  PT to continue to motivate pt to trail functional mobility and strengthen B LE's.  Recommendations for follow up therapy are one component of a multi-disciplinary discharge planning process, led by the attending physician.  Recommendations may be updated based on patient status, additional functional criteria and insurance authorization.  Follow Up Recommendations  Skilled nursing-short term rehab (<3 hours/day)     Assistance Recommended at Discharge Frequent or constant Supervision/Assistance  Equipment Recommendations  Wheelchair (measurements PT);BSC/3in1    Recommendations for Other Services       Precautions / Restrictions Precautions Precautions: Fall     Mobility  Bed Mobility Overal bed mobility: Needs Assistance Bed Mobility: Supine to Sit;Rolling;Sit to  Supine Rolling: Mod assist   Supine to sit: Mod assist Sit to supine: Mod assist   General bed mobility comments: Pt requires mod A to roll L and R to change pads underneath her.  PT assist at pelvis/shoulder blade.  Pt. transfers to sitting EOB with mod A for trunk support.  Demos fair sitting balance initially but improves with time spent in sitting.  Pt. reports wanting to try to get to chair, but when PT sets up chair pt. states she is too scared.  PT attempts to encourage pt. to practice standing EOB with PT support from front, but pt. refuses at this time.  Staes she feels lightheaded and shaky in sitting.  After some LE therex, pt. is encouraged to transfer BTB.  Pt. req's mod A for B LE support to transfer and mod A with bed in trendelenberg to scoot up.  Pt completes some more LE therex in supine prior to requesting bed pan.  Pt. needs mod A to roll and position bed pan. Patient Response: Cooperative  Transfers                   General transfer comment: Would benefit from [place] stedy for sit > stand trial as pt. is fearful to attempt standing.    Ambulation/Gait                   Stairs             Wheelchair Mobility    Modified Rankin (Stroke Patients Only)       Balance Overall balance assessment: Modified  Independent Sitting-balance support: Bilateral upper extremity supported;Feet supported Sitting balance-Leahy Scale: Fair                                      Cognition Arousal/Alertness: Awake/alert Behavior During Therapy: WFL for tasks assessed/performed Overall Cognitive Status: Within Functional Limits for tasks assessed                                 General Comments: Pt. is agreeable to work with PT.  Wants to try to sit up EOB.  States that the pads underneath her are wet.  PT offers to assist her in changing pads prior to sitting up.        Exercises General Exercises - Lower Extremity Long Arc  Quad: AROM;Right;10 reps;Supine;Seated (Only does 5 AA on L side; states it hurts even with small ROM.) Hip ABduction/ADduction: AROM;Right;10 reps;AAROM;Left    General Comments        Pertinent Vitals/Pain Pain Assessment: 0-10 Pain Score: 0-No pain Pain Intervention(s): Limited activity within patient's tolerance    Home Living                          Prior Function            PT Goals (current goals can now be found in the care plan section) Progress towards PT goals: Progressing toward goals    Frequency    Min 2X/week      PT Plan Current plan remains appropriate    Co-evaluation              AM-PAC PT "6 Clicks" Mobility   Outcome Measure  Help needed turning from your back to your side while in a flat bed without using bedrails?: A Lot Help needed moving from lying on your back to sitting on the side of a flat bed without using bedrails?: A Lot Help needed moving to and from a bed to a chair (including a wheelchair)?: A Lot Help needed standing up from a chair using your arms (e.g., wheelchair or bedside chair)?: Total Help needed to walk in hospital room?: Total Help needed climbing 3-5 steps with a railing? : Total 6 Click Score: 9    End of Session   Activity Tolerance: Patient tolerated treatment well Patient left: in bed;with call bell/phone within reach         Time: 9485-4627 PT Time Calculation (min) (ACUTE ONLY): 38 min  Charges:  $Therapeutic Exercise: 8-22 mins $Therapeutic Activity: 23-37 mins                    Lamont Tant A. Amaka Gluth, PT, DPT Acute Rehabilitation Services Office: [place] 05/26/2021, 10:32 AM

## 2021-05-26 NOTE — TOC Transition Note (Addendum)
Transition of Care [facility]) - CM/SW Discharge Note   Patient Details  Name: Crystal Vega MRN: [id number] Date of Birth: May 15, 1951  Transition of [facility]) CM/SW Contact:  Joanne Chars, LCSW Phone Number: 05/26/2021, 11:25 AM   Clinical Narrative:   Pt discharging to [facility], rm D8-2.  RN call report to [phone]. PTAR called [phone].      Final next level of care: Skilled Nursing Facility Barriers to Discharge: Barriers Resolved   Patient Goals and CMS Choice   CMS Medicare.gov Compare Post Acute Care list provided to::  (pt would like to return to [place])    Discharge Placement              Patient chooses bed at:  [facility]) Patient to be transferred to facility by: [facility] Name of family member notified: husband Herbie [place] Patient and family notified of of transfer: 05/26/21  Discharge Plan and Services     Post Acute Care Choice: [facility]                               Social Determinants of Health (SDOH) Interventions     Readmission Risk Interventions Readmission Risk Prevention Plan 04/28/2021  Transportation Screening Complete  Home Care Screening Complete  Medication Review (RN CM) Complete  Some recent data might be hidden

## 2021-05-26 NOTE — Discharge Summary (Signed)
[facility]  Physician Discharge Summary   Patient ID: Crystal Vega MRN: [id number] DOB/AGE: [birth year] 70 y.o.  Admit date: 05/15/2021 Discharge date:   05/26/2021   PCP: Lemmie Evens, MD  DISCHARGE DIAGNOSES:  Hypovolemic shock secondary to vaginal bleeding Postmenopausal vaginal bleeding/DU B Acute blood loss anemia Sepsis secondary to E. coli bacteremia Left-sided hip and knee pain likely due to severe arthritis Incidental COVID infection History of DVT Essential hypertension History of breast and colon cancer   RECOMMENDATIONS FOR OUTPATIENT FOLLOW UP: GYN has cleared for the patient to resume her Eliquis.  If there is recurrence of bleeding consideration may be given to low-dose Eliquis.  May need to discuss this further with patient's oncologist if she has recurrence of bleeding. Follow-up with patient's oncologist, Dr. Delton Coombes, MD in 3 to 4 weeks. Please check CBC basic metabolic panel and magnesium level before the end of this week, and then check weekly [facility]eds [facility] in 2 weeks   Home Health: SNF Equipment/Devices: None  CODE STATUS: DNR  DISCHARGE CONDITION: fair  Diet recommendation: Modified carbohydrate  INITIAL HISTORY: 70 year old with past medical history significant for breast cancer undergoing chemo, colonic adenocarcinoma, iron deficiency anemia, DVT on Eliquis presents to [facility] ED on 12/16 with acute blood loss anemia.  Patient has been feeling more fatigue and report vaginal bleeding for about 2 weeks.  She denies frank red bloody stool but does have a history of colon adenocarcinoma and has occasional bloody stool.  Recent transvaginal ultrasound showed thickened endometrium up to 19 mm with plans to receive endometrial sampling to rule out carcinoma.   Patient was admitted with hypotension, systolic blood pressure in the 60.  Sodium 125 creatinine 1.8, hemoglobin of 5, platelets 97  COVID-positive.  OB/gynecology and GI consulted.  Patient received blood transfusion, started on Megace, she was also started on IV pressors Levophed.  She was admitted by the ICU team.   Patient stabilized, transferred to [facility] 12/19.   Admitted hemorrhagic and septic shock. Received Blood transfusion.  Patient was seen by gynecology.  Underwent D&C on 05/20/21.  Port-A-Cath was removed on 05/20/21.      Consultations: GYN Infectious disease Interventional radiology Palliative care  Procedures: Removal of Port-A-Cath D&C   HOSPITAL COURSE:   Hypovolemic Shock, secondary to Vaginal Bleeding. Acute Blood loss anemia Transvaginal US 09/24/2020; 19 mm thick endometrium.  Eliquis was placed on hold.  Patient was seen by OB/GYN. She did receive 2 units of PRBC on 12/19. Underwent D&C on 12/21.  Pathology is pending.  GYN will follow this in the outpatient setting. Remains on Megace.  To be continued on this medication Vaginal bleeding appears to have subsided.  Discussed with Dr. Roselie Awkward with the GYN.  Continue Megace at current dose.  Okay to resume Eliquis.   Septic Shock, due to E coli Bacteremia;  Blood culture positive for E coli 05/16/2021 ID consulted. They recommend oncology input in regards to [facility].  Case was discussed with patient's oncologist, Dr. Delton Coombes.  He was okay with removal of port.  Port cath was removed on 12/21. TEE was recommended by ID.  This is scheduled for 12/23 however patient refused to undergo the study being encouraged to do so.  Has been canceled. Patient was complaining of left-sided hip pain and knee pain.  It appears that it has been ongoing for several weeks.  No fractures found on imaging studies done so far.  Per infectious disease there was  some concern for joint infection and so orthopedics was consulted.  Orthopedics evaluated the patient and did not feel there was any concern for septic arthritis. Patient was subsequently transitioned from  ceftriaxone to levofloxacin. Final recommendations from ID is to continue levofloxacin until 12/28.     Left knee pain and swelling/polyarthralgia Apart from her left hip pain she also has left knee pain.  X-ray of the knee does not show any joint effusion.  Severe arthritis is noted.  Uric acid level is 3.5.  Not suggestive of acute gout. She will need physical therapy.  No further intervention to be done at this time.  Seen by orthopedics as mentioned above.  Patient will need to be seen by orthopedics in the outpatient setting.  Can be seen by Dr. Aline Brochure in [place].   Acute kidney injury/urinary retention Seems to have resolved with IV fluids.  Monitor urine output.   Noted to have Foley catheter.  This was placed on 12/19 for ? retention.  That was when patient was quite critically ill.   Foley catheter was removed on 12/23.  Has been able to void on her own.    Hyponatremia Sodium level has been low but stable for the most part.   Hypomagnesemia Was repleted intravenously.  Started on oral magnesium oxide.  Will benefit from this be checked again in the next few days   Incidental Covid:  She was given Remdesivir for 3 days.  Respiratory status is stable with saturations noted to be normal on room air.   She tested positive on 12/16.  She completed 10 days of isolation on 12/26.  Can come off of isolation today, 12/27.    Hx of DVT DVT was diagnosed in the left leg in October 2021.  DVT was in the setting of malignancy.  Hence anticoagulation likely needs to be continued lifelong if possible.   Eliquis was held due to vaginal bleeding.  Bleeding appears to have subsided.  Discussed with GYN who have cleared the patient to resume Eliquis.  If she has recurrence of bleeding this may need to be discussed further with her oncologist Dr. Delton Coombes.   Essential hypertension/hyperlipidemia Metoprolol was resumed.  ARB is on hold.  Blood pressure is reasonably well controlled.  Remains  on pravastatin.  Sinus tachycardia  Likely multifactorial in setting of acute blood loss and sepsis.  Was noted to be on beta-blocker at home which was held at the time of admission.  Could have been experiencing some rebound tachycardia.  Metoprolol was resumed.   Heart rate has improved and stable.  Telemetry was discontinued.   Thrombocytopenia Stable platelet counts.  History of breast and colon cancer Followed by Dr. Delton Coombes with hematology/oncology.   Diabetes mellitus type 2, controlled CBGs are reasonably well controlled.    Goals of care Palliative care to follow at skilled nursing facility  Morbid obesity Estimated body mass index is 47.33 kg/m as calculated from the following:   Height as of this encounter: 5\' 3"  (1.6 m).   Weight as of this encounter: 121.2 kg.  Stage II decubitus left thigh.  Stage II decubitus buttocks Pressure Injury 05/16/21 Heel Right Deep Tissue Pressure Injury - Purple or maroon localized area of discolored intact skin or blood-filled blister due to damage of underlying soft tissue from pressure and/or shear. (Active)  05/16/21 0300  Location: Heel  Location Orientation: Right  Staging: Deep Tissue Pressure Injury - Purple or maroon localized area of discolored intact skin or blood-filled  blister due to damage of underlying soft tissue from pressure and/or shear.  Wound Description (Comments):   Present on Admission: Yes     Pressure Injury 05/16/21 Buttocks Left Unstageable - Full thickness tissue loss in which the base of the injury is covered by slough (yellow, tan, gray, green or brown) and/or eschar (tan, brown or black) in the wound bed. (Active)  05/16/21 0300  Location: Buttocks  Location Orientation: Left  Staging: Unstageable - Full thickness tissue loss in which the base of the injury is covered by slough (yellow, tan, gray, green or brown) and/or eschar (tan, brown or black) in the wound bed.  Wound Description (Comments):    Present on Admission: Yes     Pressure Injury 05/18/21 Thigh Left;Posterior;Proximal Stage 2 -  Partial thickness loss of dermis presenting as a shallow open injury with a red, pink wound bed without slough. pink, red (Active)  05/18/21 1245  Location: Thigh  Location Orientation: Left;Posterior;Proximal  Staging: Stage 2 -  Partial thickness loss of dermis presenting as a shallow open injury with a red, pink wound bed without slough. (skin tear)  Wound Description (Comments): pink, red  Present on Admission: Yes     Pressure Injury 05/18/21 Buttocks Medial Stage 2 -  Partial thickness loss of dermis presenting as a shallow open injury with a red, pink wound bed without slough. red pink (Active)  05/18/21 1245  Location: Buttocks  Location Orientation: Medial  Staging: Stage 2 -  Partial thickness loss of dermis presenting as a shallow open injury with a red, pink wound bed without slough.  Wound Description (Comments): red pink  Present on Admission: Yes    Patient is stable for discharge back to her skilled nursing facility.   PERTINENT LABS:  The results of significant diagnostics from this hospitalization (including imaging, microbiology, ancillary and laboratory) are listed below for reference.    Microbiology: No results found for this or any previous visit (from the past 240 hour(s)).    Labs:  COVID-19 Labs   Lab Results  Component Value Date   SARSCOV2NAA POSITIVE (A) 05/15/2021   [place] NEGATIVE 04/27/2021   [place] NEGATIVE 12/29/2019   [place] NEGATIVE 12/11/2019      Basic Metabolic Panel: Recent Labs  Lab 05/20/21 0444 05/21/21 0632 05/22/21 0614 05/23/21 0227 05/25/21 0704  NA 135 133* 133* 131* 136  K 3.5 4.3 4.1 4.3 4.2  CL 104 102 103 99 104  CO2 26 25 25 27 25   GLUCOSE 125* 105* 89 94 86  BUN 12 12 13 12 10   CREATININE 0.77 0.59 0.62 0.53 0.67  CALCIUM 7.5* 7.6* 7.7* 7.8* 7.9*  MG  --  1.2* 1.8 1.4* 1.3*    CBC: Recent  Labs  Lab 05/20/21 0444 05/21/21 0632 05/22/21 0614 05/23/21 0227 05/25/21 0837  WBC 7.2 6.7 8.2 7.8 7.8  HGB 8.5* 9.5* 8.9* 9.7* 8.9*  HCT 26.0* 30.1* 27.9* 30.6* 28.0*  MCV 88.7 89.9 89.7 89.5 89.5  PLT 126* 130* 133* 173 PLATELET CLUMPS NOTED ON SMEAR, UNABLE TO ESTIMATE    BNP: BNP (last 3 results) Recent Labs    05/15/21 1658  BNP 121.0*    CBG: Recent Labs  Lab 05/25/21 1118 05/25/21 1621 05/25/21 2141 05/26/21 0012 05/26/21 0838  GLUCAP 103* 124* 180* 138* 82     IMAGING STUDIES DG Chest 1 View  Result Date: 04/27/2021 CLINICAL DATA:  [place] 2 weeks ago EXAM: CHEST  1 VIEW COMPARISON:  07/28/2020 FINDINGS: Power port  on the right with the tip in the right atrium, possibly approaching the tricuspid valve. No evidence of heart failure. Patient has taken a poor inspiration. No traumatic regional finding. IMPRESSION: Poor inspiration. No traumatic finding. Power port tip in the right atrium, possibly near the tricuspid valve. Electronically Signed   By: Nelson Chimes M.D.   On: 04/27/2021 13:18   DG Lumbar Spine 2-3 Views  Result Date: 04/29/2021 CLINICAL DATA:  Low back and left hip pain EXAM: LUMBAR SPINE - 2-3 VIEW COMPARISON:  CT 04/27/2021 FINDINGS: Limited resolution. Five lumbar type vertebral bodies do not show any visible malalignment. There is degenerative disc disease and degenerative facet disease throughout the lumbar region. Bilateral sacroiliac degenerated changes noted. Advanced chronic arthropathy of the hips, left worse than right. IMPRESSION: Limited imaging. 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 Bilateral sacroiliac arthritis. Electronically Signed   By: Nelson Chimes M.D.   On: 04/29/2021 09:52   DG Knee 1-2 Views Left  Result Date: 04/27/2021 CLINICAL DATA:  [place] 2 weeks ago.  Pain and limited range of motion. EXAM: LEFT KNEE - 1-2 VIEW COMPARISON:  None. FINDINGS: There is tricompartmental  osteoarthritis with a joint effusion. No evidence of fracture or focal bone lesion. IMPRESSION: Osteoarthritis and joint effusion. No traumatic bone finding visible. Electronically Signed   By: Nelson Chimes M.D.   On: 04/27/2021 13:16   CT Angio Chest PE W and/or Wo Contrast  Result Date: 04/27/2021 CLINICAL DATA:  PE suspected, high probability. LEFT leg pain and weakness status post fall 2 weeks ago. History of metastatic LEFT breast cancer, per clinical data provided on previous CT report. EXAM: CT ANGIOGRAPHY CHEST CT ABDOMEN AND PELVIS WITH CONTRAST TECHNIQUE: Multidetector CT imaging of the chest was performed using the standard protocol during bolus administration of intravenous contrast. Multiplanar CT image reconstructions and MIPs were obtained to evaluate the vascular anatomy. Multidetector CT imaging of the abdomen and pelvis was performed using the standard protocol during bolus administration of intravenous contrast. CONTRAST:  167mL OMNIPAQUE IOHEXOL 350 MG/ML SOLN COMPARISON:  CT chest abdomen and pelvis dated 04/09/2021. FINDINGS: CTA CHEST FINDINGS Cardiovascular: Some of the most peripheral subsegmental pulmonary arteries are difficult to definitively characterize due to patient breathing motion artifact, however, there is no pulmonary embolism seen within the main, lobar or central segmental pulmonary arteries bilaterally. No thoracic aortic aneurysm or evidence of aortic dissection. No pericardial effusion. Mediastinum/Nodes: Mediastinal and perihilar lymphadenopathy, including a 1.2 cm short axis lymph node within the anterior mediastinum and a 1.6 cm short axis lymph node within the RIGHT lower paratracheal mediastinum, not significantly changed compared to the recent chest CT of 04/09/2021, compatible with metastatic lymphadenopathy. Additional conglomerate lymphadenopathy is also again seen within the LEFT supraclavicular region, incompletely imaged. z Esophagus is unremarkable. Trachea  and central bronchi are unremarkable. Lungs/Pleura: Lungs are clear.  No pleural effusion or pneumothorax. Musculoskeletal: No acute appearing osseous abnormality. Lytic-appearing lesion within the T6 vertebral body is suspicious for osseous metastasis. Additional destructive/lytic changes within the sternum, also suggesting metastatic disease. Conglomerate lymphadenopathy within the LEFT axilla, and mass/lymphadenopathy in the axillary tail region of the LEFT breast measures 4 cm greatest dimension, all of which is not significantly changed compared to the recent chest CT of 04/09/2021. Additional milder lymphadenopathy within the RIGHT axilla is redemonstrated and stable. Review of the MIP images confirms the above findings. CT ABDOMEN and PELVIS FINDINGS Hepatobiliary: Numerous small hypodense lesions within the bilateral liver lobes,  as also described on the earlier CT abdomen report of 12/12/2019, presumably numerous small liver metastases. Single stone within the otherwise normal-appearing gallbladder. No bile duct dilatation is seen. Pancreas: Unremarkable. No pancreatic ductal dilatation or surrounding inflammatory changes. Spleen: Normal in size without focal abnormality. Adrenals/Urinary Tract: Adrenals are unremarkable. Bilateral renal cysts. Kidneys are otherwise unremarkable without suspicious mass, stone or hydronephrosis. No ureteral or bladder calculi are identified. Bladder is unremarkable. Stomach/Bowel: No dilated large or small bowel loops. Scattered diverticulosis of the descending and sigmoid colon but no focal inflammatory change to suggest acute diverticulitis. No evidence of acute bowel wall inflammation. Stomach is unremarkable. Appendix is normal. Vascular/Lymphatic: Vascular structures of the abdomen and pelvis are unremarkable. Conglomerate lymphadenopathy above the pancreatic head and adjacent to the porta hepatis, as previously described, presumed lymph node metastases. Reproductive:  Calcified uterine fibroids. No adnexal mass or free fluid. Other: No free fluid or abscess collection. No free intraperitoneal air. Musculoskeletal: No acute findings. Degenerative spondylosis of the lumbar spine, mild to moderate in degree. 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 Review of the MIP images confirms the above findings. IMPRESSION: 1. No acute findings within the chest. No pulmonary embolism is seen, with mild study limitations detailed above. No evidence of pneumonia or pulmonary edema. 2. Metastatic lymphadenopathy is redemonstrated within the mediastinum, bilateral axillae, and supraclavicular LEFT neck. Also redemonstrated is the mass versus conglomerate lymphadenopathy in the axillary tail region of the LEFT breast. These findings are stable compared to the recent chest CT of 04/09/2021. 3. Lytic-appearing lesion within the T6 vertebral body, and destructive/lytic changes within the sternum, suspicious for metastatic osseous disease. Consider nuclear medicine bone scan or PET scan for confirmation. 4. Numerous small hypodense lesions within the bilateral liver lobes, as also described on the earlier CT abdomen report of 12/12/2019, presumably liver metastases. 5. Cholelithiasis without evidence of acute cholecystitis. 6. Colonic diverticulosis without evidence of acute diverticulitis. 7. No acute findings within the abdomen or pelvis. No bowel obstruction or evidence of acute bowel wall inflammation. No free fluid or abscess collection. No evidence of acute solid organ abnormality. 8. No evidence of acute osseous fracture or dislocation is seen. Electronically Signed   By: Franki Cabot M.D.   On: 04/27/2021 17:09   CT Knee Left Wo Contrast  Result Date: 04/27/2021 CLINICAL DATA:  Fall 2 weeks ago comment limited range of motion EXAM: CT OF THE LEFT KNEE WITHOUT CONTRAST TECHNIQUE: Multidetector CT imaging of the left knee was performed according to the standard protocol. Multiplanar CT image  reconstructions were also generated. COMPARISON:  Radiographs 04/27/2021 FINDINGS: Bones/Joint/Cartilage The knee was imaged in a moderately flexed orientation. There is marked proliferative spurring all 3 compartments with associated cortical irregularity which can reduce sensitivity for subtle fractures. I do not see a well-defined cortical discontinuity to indicate fracture. There is a moderate knee effusion in the suprapatellar bursa. Ligaments Suboptimally assessed by CT. Muscles and Tendons Mild regional muscular atrophy diffusely. Soft tissues Atherosclerosis. Subcutaneous edema anterior to the patella, cannot exclude prepatellar bruising. IMPRESSION: 1. 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 No fracture is identified, although sensitivity for subtle fractures is reduced due to the degree of spurring and associated cortical irregularities. 2. Moderate knee effusion. 3. Atherosclerosis. 4. Subcutaneous edema anterior to the patella. Electronically Signed   By: Van Clines M.D.   On: 04/27/2021 17:15   CT ABDOMEN PELVIS W CONTRAST  Result Date: 04/27/2021 CLINICAL DATA:  PE suspected, high probability. LEFT leg  pain and weakness status post fall 2 weeks ago. History of metastatic LEFT breast cancer, per clinical data provided on previous CT report. EXAM: CT ANGIOGRAPHY CHEST CT ABDOMEN AND PELVIS WITH CONTRAST TECHNIQUE: Multidetector CT imaging of the chest was performed using the standard protocol during bolus administration of intravenous contrast. Multiplanar CT image reconstructions and MIPs were obtained to evaluate the vascular anatomy. Multidetector CT imaging of the abdomen and pelvis was performed using the standard protocol during bolus administration of intravenous contrast. CONTRAST:  175mL OMNIPAQUE IOHEXOL 350 MG/ML SOLN COMPARISON:  CT chest abdomen and pelvis dated 04/09/2021. FINDINGS: CTA CHEST FINDINGS Cardiovascular: Some of the most peripheral  subsegmental pulmonary arteries are difficult to definitively characterize due to patient breathing motion artifact, however, there is no pulmonary embolism seen within the main, lobar or central segmental pulmonary arteries bilaterally. No thoracic aortic aneurysm or evidence of aortic dissection. No pericardial effusion. Mediastinum/Nodes: Mediastinal and perihilar lymphadenopathy, including a 1.2 cm short axis lymph node within the anterior mediastinum and a 1.6 cm short axis lymph node within the RIGHT lower paratracheal mediastinum, not significantly changed compared to the recent chest CT of 04/09/2021, compatible with metastatic lymphadenopathy. Additional conglomerate lymphadenopathy is also again seen within the LEFT supraclavicular region, incompletely imaged. z Esophagus is unremarkable. Trachea and central bronchi are unremarkable. Lungs/Pleura: Lungs are clear.  No pleural effusion or pneumothorax. Musculoskeletal: No acute appearing osseous abnormality. Lytic-appearing lesion within the T6 vertebral body is suspicious for osseous metastasis. Additional destructive/lytic changes within the sternum, also suggesting metastatic disease. Conglomerate lymphadenopathy within the LEFT axilla, and mass/lymphadenopathy in the axillary tail region of the LEFT breast measures 4 cm greatest dimension, all of which is not significantly changed compared to the recent chest CT of 04/09/2021. Additional milder lymphadenopathy within the RIGHT axilla is redemonstrated and stable. Review of the MIP images confirms the above findings. CT ABDOMEN and PELVIS FINDINGS Hepatobiliary: Numerous small hypodense lesions within the bilateral liver lobes, as also described on the earlier CT abdomen report of 12/12/2019, presumably numerous small liver metastases. Single stone within the otherwise normal-appearing gallbladder. No bile duct dilatation is seen. Pancreas: Unremarkable. No pancreatic ductal dilatation or surrounding  inflammatory changes. Spleen: Normal in size without focal abnormality. Adrenals/Urinary Tract: Adrenals are unremarkable. Bilateral renal cysts. Kidneys are otherwise unremarkable without suspicious mass, stone or hydronephrosis. No ureteral or bladder calculi are identified. Bladder is unremarkable. Stomach/Bowel: No dilated large or small bowel loops. Scattered diverticulosis of the descending and sigmoid colon but no focal inflammatory change to suggest acute diverticulitis. No evidence of acute bowel wall inflammation. Stomach is unremarkable. Appendix is normal. Vascular/Lymphatic: Vascular structures of the abdomen and pelvis are unremarkable. Conglomerate lymphadenopathy above the pancreatic head and adjacent to the porta hepatis, as previously described, presumed lymph node metastases. Reproductive: Calcified uterine fibroids. No adnexal mass or free fluid. Other: No free fluid or abscess collection. No free intraperitoneal air. Musculoskeletal: No acute findings. Degenerative spondylosis of the lumbar spine, mild to moderate in degree. 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 Review of the MIP images confirms the above findings. IMPRESSION: 1. No acute findings within the chest. No pulmonary embolism is seen, with mild study limitations detailed above. No evidence of pneumonia or pulmonary edema. 2. Metastatic lymphadenopathy is redemonstrated within the mediastinum, bilateral axillae, and supraclavicular LEFT neck. Also redemonstrated is the mass versus conglomerate lymphadenopathy in the axillary tail region of the LEFT breast. These findings are stable compared to the recent chest CT of 04/09/2021. 3. Lytic-appearing  lesion within the T6 vertebral body, and destructive/lytic changes within the sternum, suspicious for metastatic osseous disease. Consider nuclear medicine bone scan or PET scan for confirmation. 4. Numerous small hypodense lesions within the bilateral liver lobes, as also described on the earlier CT  abdomen report of 12/12/2019, presumably liver metastases. 5. Cholelithiasis without evidence of acute cholecystitis. 6. Colonic diverticulosis without evidence of acute diverticulitis. 7. No acute findings within the abdomen or pelvis. No bowel obstruction or evidence of acute bowel wall inflammation. No free fluid or abscess collection. No evidence of acute solid organ abnormality. 8. No evidence of acute osseous fracture or dislocation is seen. Electronically Signed   By: Franki Cabot M.D.   On: 04/27/2021 17:09   CT Hip Left Wo Contrast  Result Date: 04/27/2021 CLINICAL DATA:  Hip trauma, fracture suspected. EXAM: CT OF THE LEFT HIP WITHOUT CONTRAST TECHNIQUE: Multidetector CT imaging of the left hip was performed according to the standard protocol. Multiplanar CT image reconstructions were also generated. COMPARISON:  None. FINDINGS: No evidence of acute fracture or dislocation at the LEFT hip. 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 Deformity of the LEFT humeral head/neck is compatible with previous injury versus chronic deformity related to the overlying degenerative joint disease. Visualized osseous structures of the LEFT hemipelvis appear intact and normally aligned. Additional degenerative change noted at the LEFT SI joint and within the lower lumbar spine. Visualized soft tissues about the LEFT hip are unremarkable. IMPRESSION: 1. No evidence of acute fracture or dislocation at the LEFT hip. 2. 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 Electronically Signed   By: Franki Cabot M.D.   On: 04/27/2021 17:13   IR REMOVAL TUN ACCESS W/ PORT W/O FL MOD SED  Result Date: 05/20/2021 CLINICAL DATA:  70 year old female with history of breast cancer and recent bacteremia. Port removal requested. EXAM: REMOVAL OF IMPLANTED TUNNELED  PORT-A-CATH MEDICATIONS: None. ANESTHESIA/SEDATION: Moderate (conscious) sedation was employed during this procedure. A total of Versed 2 mg and Fentanyl 50 mcg was administered intravenously. Moderate Sedation Time: 20 minutes. The patient's level of consciousness and vital signs were monitored continuously by radiology nursing throughout the procedure under my direct supervision. FLUOROSCOPY TIME:  None PROCEDURE: Informed written consent was obtained from the patient after a discussion of the risk, benefits and alternatives to the procedure. The patient was positioned supine on the fluoroscopy table and the right chest Port-A-Cath site was prepped with chlorhexidine. A sterile gown and gloves were worn during the procedure. Local anesthesia was provided with 1% lidocaine with epinephrine. A timeout was performed prior to the initiation of the procedure. An incision was made overlying the Port-A-Cath with a #15 scalpel. Utilizing sharp and blunt dissection, the Port-A-Cath was removed completely. The pocked was irrigated with sterile saline. Wound closure was performed with deep dermal 2-0 and Dermabond. A dressing was placed. The patient tolerated the procedure well without immediate post procedural complication. FINDINGS: Successful removal of implant Port-A-Cath without immediate post procedural complication. IMPRESSION: Successful removal of implanted Port-A-Cath. Ruthann Cancer, MD [facility] Electronically Signed   By: Ruthann Cancer M.D.   On: 05/20/2021 13:56   DG Chest Port 1 View  Result Date: 05/16/2021 CLINICAL DATA:  Short of breath.  COVID-19. EXAM: PORTABLE CHEST 1 VIEW COMPARISON:  04/29/2021 and older exams. FINDINGS: Cardiac silhouette  normal in size.  No mediastinal or hilar masses. Stable elevation of the right hemidiaphragm. Lungs are clear. No convincing pleural effusion and no pneumothorax. Right internal jugular Port-A-Cath is  also stable, tip projecting in the right atrium. IMPRESSION: 1. No acute cardiopulmonary disease. Electronically Signed   By: Lajean Manes M.D.   On: 05/16/2021 09:11   DG CHEST PORT 1 VIEW  Result Date: 04/29/2021 CLINICAL DATA:  Leukocytosis, atrial tachycardia, history of breast cancer EXAM: PORTABLE CHEST 1 VIEW COMPARISON:  04/27/2021 FINDINGS: Right chest wall port catheter is unchanged. Persistent elevation of the right hemidiaphragm. No new consolidation or edema. No pleural effusion. Stable cardiomediastinal contours. IMPRESSION: No acute process in the chest. Electronically Signed   By: Macy Mis M.D.   On: 04/29/2021 08:05   DG Knee Left Port  Result Date: 05/21/2021 CLINICAL DATA:  Swollen knee EXAM: PORTABLE LEFT KNEE - 1-2 VIEW COMPARISON:  None. FINDINGS: Patient not able to fully cooperate with positioning. Suboptimal projections. 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 No acute fracture or mass lesion.  No joint effusion IMPRESSION: Tricompartmental degenerative change.  No acute abnormality. Electronically Signed   By: Franchot Gallo M.D.   On: 05/21/2021 11:12   ECHOCARDIOGRAM COMPLETE  Result Date: 04/30/2021    ECHOCARDIOGRAM REPORT   Patient Name:   Crystal Vega Date of Exam: 04/30/2021 Medical Rec #:  [id number]       Height:       63.0 in Accession #:    [id number]      Weight:       257.1 lb Date of Birth:  [birth year]       BSA:          2.151 m Patient Age:    70 years        BP:           112/70 mmHg Patient Gender: F               HR:           84 bpm. Exam Location:  [facility] Procedure: 2D Echo, Cardiac Doppler and Color Doppler Indications:    Bacteremia  History:        Patient has prior history of Echocardiogram examinations, most                 recent 09/20/1942. Signs/Symptoms:Chest Pain; Risk                 Factors:Hypertension and Diabetes. Port-a-cath access only, no                  IV, Breast CA,.  Sonographer:    Wenda Low Referring Phys: [id number] COURAGE EMOKPAE  Sonographer Comments: Patient is morbidly obese and Technically difficult study due to poor echo windows. IMPRESSIONS  1. Left ventricular ejection fraction, by estimation, is 60 to 65%. The left ventricle has normal function. Left ventricular endocardial border not optimally defined to evaluate regional wall motion. There is moderate left ventricular hypertrophy. Left ventricular diastolic parameters are indeterminate.  2. Right ventricular systolic function is normal. The right ventricular size is mildly enlarged. There is normal pulmonary artery systolic pressure. The estimated right ventricular systolic pressure is 73.2 mmHg.  3. The mitral valve is grossly normal. Trivial mitral valve regurgitation.  4. The aortic valve is tricuspid. Aortic valve regurgitation is not visualized. Aortic valve sclerosis is present, with no evidence of aortic  valve stenosis. Aortic valve mean gradient measures 3.0 mmHg.  5. The inferior vena cava is normal in size with greater than 50% respiratory variability, suggesting right atrial pressure of 3 mmHg. Comparison(s): No significant change from prior study. Prior images reviewed side by side. FINDINGS  Left Ventricle: Left ventricular ejection fraction, by estimation, is 60 to 65%. The left ventricle has normal function. Left ventricular endocardial border not optimally defined to evaluate regional wall motion. The left ventricular internal cavity size was normal in size. There is moderate left ventricular hypertrophy. Left ventricular diastolic parameters are indeterminate. Right Ventricle: The right ventricular size is mildly enlarged. No increase in right ventricular wall thickness. Right ventricular systolic function is normal. There is normal pulmonary artery systolic pressure. The tricuspid regurgitant velocity is 2.64  m/s, and with an assumed right atrial pressure of 3 mmHg,  the estimated right ventricular systolic pressure is 16.1 mmHg. Left Atrium: Left atrial size was normal in size. Right Atrium: Right atrial size was normal in size. Pericardium: There is no evidence of pericardial effusion. Mitral Valve: The mitral valve is grossly normal. Trivial mitral valve regurgitation. MV peak gradient, 3.7 mmHg. The mean mitral valve gradient is 2.0 mmHg. Tricuspid Valve: The tricuspid valve is grossly normal. Tricuspid valve regurgitation is mild. Aortic Valve: The aortic valve is tricuspid. There is mild aortic valve annular calcification. Aortic valve regurgitation is not visualized. Aortic valve sclerosis is present, with no evidence of aortic valve stenosis. Aortic valve mean gradient measures  3.0 mmHg. Aortic valve peak gradient measures 4.9 mmHg. Aortic valve area, by VTI measures 2.32 cm. Pulmonic Valve: The pulmonic valve was grossly normal. Pulmonic valve regurgitation is trivial. Aorta: The aortic root is normal in size and structure. Venous: The inferior vena cava is normal in size with greater than 50% respiratory variability, suggesting right atrial pressure of 3 mmHg. IAS/Shunts: No atrial level shunt detected by color flow Doppler.  LEFT VENTRICLE PLAX 2D LVIDd:         3.70 cm LVIDs:         2.50 cm LV PW:         1.40 cm LV IVS:        1.30 cm LVOT diam:     2.00 cm LV SV:         61 LV SV Index:   28 LVOT Area:     3.14 cm  RIGHT VENTRICLE RV Basal diam:  3.45 cm RV Mid diam:    3.70 cm RV S prime:     11.70 cm/s LEFT ATRIUM           Index        RIGHT ATRIUM           Index LA diam:      3.00 cm 1.39 cm/m   RA Area:     12.60 cm LA Vol (A4C): 26.8 ml 12.46 ml/m  RA Volume:   31.80 ml  14.78 ml/m  AORTIC VALVE                    PULMONIC VALVE AV Area (Vmax):    2.33 cm     PV Vmax:       0.86 m/s AV Area (Vmean):   2.06 cm     PV Peak grad:  3.0 mmHg AV Area (VTI):     2.32 cm AV Vmax:           111.00 cm/s AV Vmean:  81.400 cm/s AV VTI:             0.263 m AV Peak Grad:      4.9 mmHg AV Mean Grad:      3.0 mmHg LVOT Vmax:         82.20 cm/s LVOT Vmean:        53.400 cm/s LVOT VTI:          0.194 m LVOT/AV VTI ratio: 0.74  AORTA Ao Root diam: 3.10 cm Ao Asc diam:  3.10 cm MITRAL VALVE               TRICUSPID VALVE MV Area (PHT): 5.93 cm    TR Peak grad:   27.9 mmHg MV Area VTI:   2.83 cm    TR Vmax:        264.00 cm/s MV Peak grad:  3.7 mmHg MV Mean grad:  2.0 mmHg    SHUNTS MV Vmax:       0.96 m/s    Systemic VTI:  0.19 m MV Vmean:      67.4 cm/s   Systemic Diam: 2.00 cm MV Decel Time: 128 msec MV E velocity: 94.30 cm/s MV A velocity: 91.30 cm/s MV E/A ratio:  1.03 Rozann Lesches MD Electronically signed by Rozann Lesches MD Signature Date/Time: 04/30/2021/10:53:15 AM    Final    DG HIP UNILAT WITH PELVIS 2-3 VIEWS LEFT  Result Date: 05/18/2021 CLINICAL DATA:  Chronic left hip pain. EXAM: DG HIP (WITH OR WITHOUT PELVIS) 2-3V LEFT COMPARISON:  None. FINDINGS: There is no evidence of an acute hip fracture or dislocation. Marked severity chronic and degenerative changes are seen in the form of joint space narrowing, acetabular sclerosis and subchondral cyst formation. Lobulated, calcified uterine fibroids are suspected. IMPRESSION: Marked severity chronic and degenerative changes in the left hip. Electronically Signed   By: Virgina Norfolk M.D.   On: 05/18/2021 20:46   DG Hip Unilat W or Wo Pelvis 2-3 Views Left  Result Date: 04/27/2021 CLINICAL DATA:  [place] 2 weeks ago.  Pain. EXAM: DG HIP (WITH OR WITHOUT PELVIS) 2-3V LEFT COMPARISON:  None. FINDINGS: There is advanced osteoarthritis of the left hip with joint space narrowing, sclerosis, osteophyte and flattening of femoral head. No definite acute regional fracture. 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 IMPRESSION: Advanced osteoarthritis of the left hip. No visible traumatic finding. Flattening of the humeral head which is probably chronic. Electronically Signed   By: Nelson Chimes M.D.   On: 04/27/2021 13:17    DISCHARGE EXAMINATION: Vitals:   05/25/21 0951 05/25/21 1421 05/25/21 1951 05/26/21 0824  BP: 108/81 123/70 135/61 (!) 128/101  Pulse: 88 79 86 80  Resp: 17 18 16 17   Temp: 98.1 F (36.7 C) 98.8 F (37.1 C) 98.3 F (36.8 C) 98.6 F (37 C)  TempSrc: Oral Oral Oral Oral  SpO2: 100% 100% 99% 100%  Weight:      Height:       Patient is awake alert.  In no distress Lungs are clear to auscultation bilaterally S1-S2 is normal regular.  No S3-S4.  No rubs murmurs or bruit Abdomen is soft.   DISPOSITION: SNF  Discharge Instructions     Call MD for:  difficulty breathing, headache or visual disturbances   Complete by: As directed    Call MD for:  extreme fatigue   Complete by: As directed    Call MD for:  persistant dizziness or light-headedness   Complete  by: As directed    Call MD for:  persistant nausea and vomiting   Complete by: As directed    Call MD for:  severe uncontrolled pain   Complete by: As directed    Call MD for:  temperature >100.4   Complete by: As directed    Discharge instructions   Complete by: As directed    Please review instructions on the discharge summary.  You were cared for by a hospitalist during your hospital stay. If you have any questions about your discharge medications or the care you received while you were in the hospital after you are discharged, you can call the unit and asked to speak with the hospitalist on call if the hospitalist that took care of you is not available. Once you are discharged, your primary care physician will handle any further medical issues. Please note that NO REFILLS for any discharge medications will be authorized once you are discharged, as it is imperative that you return to your primary care physician (or establish a relationship with a primary care physician if you do not have one) for your aftercare needs so that they can reassess your need for medications and monitor your lab  values. If you do not have a primary care physician, you can call [phone] for a physician referral.   Discharge wound care:   Complete by: As directed    Wound care  Daily      Comments: Apply Santyl to left buttock wound Q day, then cover with moist gauze and foam dressing.  (Change foam dressing Q 3 days or PRN soiling.)   Increase activity slowly   Complete by: As directed           Allergies as of 05/26/2021   No Known Allergies      Medication List     STOP taking these medications    amoxicillin 500 MG capsule Commonly known as: AMOXIL   Gerhardt's butt cream Crea   losartan 50 MG tablet Commonly known as: COZAAR       TAKE these medications    acetaminophen 325 MG tablet Commonly known as: TYLENOL Take 2 tablets (650 mg total) by mouth every 6 (six) hours as needed for mild pain (or Fever >/= 101).   apixaban 5 MG Tabs tablet Commonly known as: Eliquis Take 1 tablet (5 mg total) by mouth 2 (two) times daily.   Ensure Max Protein Liqd Take 330 mLs (11 oz total) by mouth daily.   nutrition supplement (JUVEN) Pack Take 1 packet by mouth 2 (two) times daily between meals.   fentaNYL 50 MCG/HR Commonly known as: [place] 1 patch onto the skin every 3 (three) days.   ferrous sulfate 325 (65 FE) MG tablet Take 325 mg by mouth daily with breakfast.   folic acid 1 MG tablet Commonly known as: FOLVITE TAKE 1 TABLET BY MOUTH EVERY DAY   gabapentin 300 MG capsule Commonly known as: NEURONTIN Take 1 capsule (300 mg total) by mouth 2 (two) times daily. What changed: when to take this   HYDROmorphone 2 MG tablet Commonly known as: Dilaudid Take 1 tablet (2 mg total) by mouth every 4 (four) hours as needed for severe pain. What changed: when to take this   Klor-Con M10 10 MEQ tablet Generic drug: potassium chloride Take 10 mEq by mouth daily.   levofloxacin 750 MG tablet Commonly known as: LEVAQUIN Take 1 tablet (750 mg total) by mouth  daily at 6 PM for  3 days.   lidocaine 5 % Commonly known as: LIDODERM Place 1 patch onto the skin daily. Remove & Discard patch within 12 hours or as directed by MD   lidocaine-prilocaine cream Commonly known as: EMLA Apply 1 application topically as needed. What changed:  when to take this reasons to take this   magnesium oxide 400 (240 Mg) MG tablet Commonly known as: MAG-OX Take 2 tablets (800 mg total) by mouth 2 (two) times daily.   megestrol 40 MG tablet Commonly known as: MEGACE Take 2 tablets (80 mg total) by mouth every 8 (eight) hours.   metFORMIN 500 MG tablet Commonly known as: GLUCOPHAGE Take 500 mg by mouth 2 (two) times daily with a meal.   metoprolol succinate 25 MG 24 hr tablet Commonly known as: Toprol XL Take 1 tablet (25 mg total) by mouth daily.   multivitamin with minerals Tabs tablet Take 1 tablet by mouth daily.   ondansetron 4 MG tablet Commonly known as: Zofran Take 1 tablet (4 mg total) by mouth daily as needed for nausea or vomiting.   pantoprazole 40 MG tablet Commonly known as: PROTONIX Take 1 tablet (40 mg total) by mouth daily.   polyethylene glycol 17 g packet Commonly known as: MIRALAX / GLYCOLAX Take 17 g by mouth 2 (two) times daily.   pravastatin 10 MG tablet Commonly known as: PRAVACHOL Take 10 mg by mouth at bedtime.   senna-docusate 8.6-50 MG tablet Commonly known as: Senokot-S Take 2 tablets by mouth at bedtime.   vitamin C 100 MG tablet Take 100 mg by mouth in the morning and at bedtime.               Discharge Care Instructions  (From admission, onward)           Start     Ordered   05/25/21 0000  Discharge wound care:       Comments: Wound care  Daily      Comments: Apply Santyl to left buttock wound Q day, then cover with moist gauze and foam dressing.  (Change foam dressing Q 3 days or PRN soiling.)   05/25/21 1207              Follow-up Information     [facility] Follow up  in 2 week(s).   Specialty: Obstetrics and Gynecology Why: Dr. Elonda Husky or Nelda Marseille f/u [facility], has seen [facility] previously Contact information: [address] [place]        Lemmie Evens, MD. Schedule an appointment as soon as possible for a visit in 1 week(s).   Specialty: Family Medicine Contact information: [place] [address] [phone]                 TOTAL DISCHARGE TIME: 57 minutes  [facility] Pager on [email or web address]  05/26/2021, 11:17 AM

## 2021-06-02 NOTE — Progress Notes (Signed)
[facility] [address], [place] [address]   CLINIC:  Medical Oncology/Hematology  PCP:  Gareth Morgan, MD [address] [place] / [place] [address] [phone]   REASON FOR VISIT:  Follow-up for left breast cancer  PRIOR THERAPY:  1. Paclitaxel x 6 cycles from 12/31/2019 to 06/02/2020. 2. Xeloda through 09/17/2020.  NGS Results: ER/HER-2 positive, PR negative, Ki-67 30%  CURRENT THERAPY: Carboplatin & Taxol D1,8,15 every 4 weeks x 6 cycles  BRIEF ONCOLOGIC HISTORY:  Oncology History  Malignant neoplasm of overlapping sites of left breast in female, estrogen receptor positive (HCC)  11/16/2019 Cancer Staging   Staging form: Breast, AJCC 8th Edition - Clinical stage from 11/16/2019: Stage IV (cT4b, cN1, cM1, G3, ER+, PR-, HER2-) - Signed by Doreatha Massed, MD on 12/17/2019    11/28/2019 Initial Diagnosis   Patient noted intermittent left breast pain and swelling for several months. Mammogram showed a 6.2cm mass in the left breast axillary tail with an adjacent 0.9cm mass, 2 abnormal left axillary lymph nodes, 1 abnormal right axillary lymph node, and diffuse left breast edema and skin thickening. Biopsy showed IDC, grade 3 in the left breast and bilateral axillas, HER-2 negative (1+), ER+ 100%, PR- 0%, Ki67 30%.    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, a 71 y.o. female, returns for routine follow-up of her left breast cancer. Crystal Vega was last seen on 03/17/2021.   Today she reports feeling well. She is still currently residing at [facility]. She reports her appetite is pretty good. She reports pain in her left hip and leg which is resolved with dilaudid. She is able to sit upright, but she is not currently walking. She reports fatigue and poor sleep. She denies fevers. She reports an episode of constipation for 5 days last week which has since resolved. She is taking Miralax BID. She reports a sore on her right buttock.   REVIEW OF SYSTEMS:  Review of Systems  Constitutional:  Positive for fatigue. Negative for appetite change and fever.  Gastrointestinal:  Negative for constipation (resolved).  Musculoskeletal:  Positive for arthralgias (L hip and leg).  Psychiatric/Behavioral:  Positive for sleep  disturbance.   All other systems reviewed and are negative.  PAST MEDICAL/SURGICAL HISTORY:  Past Medical History:  Diagnosis Date   Anemia    Arthritis    per patient " left knee"   DVT (deep venous thrombosis) (HCC)    left leg   Dyspnea    Essential hypertension, benign    Family history of cancer of female genital organ    Family history of GI tract cancer    Metastatic breast cancer ([place])    left breast   Port-A-Cath in place 12/25/2019   Type 2  diabetes mellitus [facility])    Past Surgical History:  Procedure Laterality Date   BIOPSY  11/27/2020   Procedure: BIOPSY;  Surgeon: Daneil Dolin, MD;  [facility];  Service: Endoscopy;;   CATARACT EXTRACTION W/ INTRAOCULAR LENS IMPLANT Right    COLONOSCOPY WITH PROPOFOL N/A 11/27/2020   Procedure: COLONOSCOPY WITH PROPOFOL;  Surgeon: Daneil Dolin, MD;  [facility];  Service: Endoscopy;  Laterality: N/A;  9:00am   DILATION AND CURETTAGE OF UTERUS N/A 05/20/2021   Procedure: DILATATION AND CURETTAGE;  Surgeon: Mora Bellman, MD;  Location: [facility];  Service: Gynecology;  Laterality: N/A;   HYSTEROSCOPY WITH D & C N/A 06/05/2014   Procedure: DILATATION AND CURETTAGE /HYSTEROSCOPY;  Surgeon: Florian Buff, MD;  Location: [facility];  Service: Gynecology;  Laterality: N/A;   IR REMOVAL TUN ACCESS W/ PORT W/O FL MOD SED  05/20/2021   POLYPECTOMY N/A 06/05/2014   Procedure: ENDOMETRIAL POLYPECTOMY;  Surgeon: Florian Buff, MD;  Location: [facility];  Service: Gynecology;  Laterality: N/A;   PORTACATH PLACEMENT N/A 12/14/2019   Procedure: INSERTION PORT-A-CATH WITH ULTRASOUND GUIDANCE;  Surgeon: Donnie Mesa, MD;  Location: [facility];  Service: General;  Laterality: N/A;   TUBAL LIGATION      SOCIAL HISTORY:  Social History   Socioeconomic History   Marital status: Married    Spouse name: Herbie [place]   Number of children: 2   Years of education: Not on file   Highest education level: Not on file  Occupational History   Occupation: retired  Tobacco Use   Smoking status: Former    Types: Cigarettes   Smokeless tobacco: Never   Tobacco comments:    quit about 40+ years ago  Vaping Use   Vaping Use: Never used  Substance and Sexual Activity   Alcohol use: Never   Drug use: No   Sexual activity: Not on file  Other Topics Concern   Not on file  Social History Narrative   Not on file   Social Determinants of Health   Financial Resource Strain: Not on file  Food Insecurity:  Not on file  Transportation Needs: Not on file  Physical Activity: Not on file  Stress: Not on file  Social Connections: Not on file  Intimate Partner Violence: Not on file    FAMILY HISTORY:  Family History  Problem Relation Age of Onset   Diabetes Mellitus II Father    Congestive Heart Failure Father    Colon cancer Mother 64       patient not sure if colon vs stomach   Stroke Maternal Grandmother    Cancer Cousin        female reproductive cancer, dx. 50s/60s    CURRENT MEDICATIONS:  Current Outpatient Medications  Medication Sig Dispense Refill   acetaminophen (TYLENOL) 325 MG tablet Take 2 tablets (650 mg total) by mouth every 6 (six) hours as  needed for mild pain (or Fever >/= 101). 12 tablet 0   apixaban (ELIQUIS) 5 MG TABS tablet Take 1 tablet (5 mg total) by mouth 2 (two) times daily. 60 tablet 3   Ascorbic Acid (VITAMIN C) 100 MG tablet Take 100 mg by mouth in the morning and at bedtime.     Ensure Max Protein (ENSURE MAX PROTEIN) LIQD Take 330 mLs (11 oz total) by mouth daily. 330 mL 2   fentaNYL (DURAGESIC) 50 MCG/HR Place 1 patch onto the skin every 3 (three) days. 5 patch 0   ferrous sulfate 325 (65 FE) MG tablet Take 325 mg by mouth daily with breakfast.     folic acid (FOLVITE) 1 MG tablet TAKE 1 TABLET BY MOUTH EVERY DAY (Patient taking differently: Take 1 mg by mouth daily.) 90 tablet 1   gabapentin (NEURONTIN) 300 MG capsule Take 1 capsule (300 mg total) by mouth 2 (two) times daily. 90 capsule 3   HYDROcodone-acetaminophen (NORCO/VICODIN) 5-325 MG tablet PLEASE SEE ATTACHED FOR DETAILED DIRECTIONS     HYDROmorphone (DILAUDID) 2 MG tablet Take 1 tablet (2 mg total) by mouth every 4 (four) hours as needed for severe pain. 30 tablet 0   KLOR-CON M10 10 MEQ tablet Take 10 mEq by mouth daily.     lidocaine (LIDODERM) 5 % Place 1 patch onto the skin daily. Remove & Discard patch within 12 hours or as directed by MD 30 patch 0   magnesium oxide (MAG-OX) 400 (240 Mg) MG  tablet Take 2 tablets (800 mg total) by mouth 2 (two) times daily.     megestrol (MEGACE) 40 MG tablet Take 2 tablets (80 mg total) by mouth every 8 (eight) hours.     metFORMIN (GLUCOPHAGE) 500 MG tablet Take 500 mg by mouth 2 (two) times daily with a meal.     metoprolol succinate (TOPROL XL) 25 MG 24 hr tablet Take 1 tablet (25 mg total) by mouth daily. 30 tablet 11   Multiple Vitamin (MULTIVITAMIN WITH MINERALS) TABS tablet Take 1 tablet by mouth daily.     nutrition supplement, JUVEN, (JUVEN) PACK Take 1 packet by mouth 2 (two) times daily between meals. 30 packet 2   pantoprazole (PROTONIX) 40 MG tablet Take 1 tablet (40 mg total) by mouth daily.     polyethylene glycol (MIRALAX / GLYCOLAX) 17 g packet Take 17 g by mouth 2 (two) times daily. 60 each 2   pravastatin (PRAVACHOL) 10 MG tablet Take 10 mg by mouth at bedtime.     senna-docusate (SENOKOT-S) 8.6-50 MG tablet Take 2 tablets by mouth at bedtime. 60 tablet 1   lidocaine-prilocaine (EMLA) cream Apply 1 application topically as needed. (Patient not taking: Reported on 06/03/2021) 30 g 3   ondansetron (ZOFRAN) 4 MG tablet Take 1 tablet (4 mg total) by mouth daily as needed for nausea or vomiting. (Patient not taking: Reported on 06/03/2021) 30 tablet 1   No current facility-administered medications for this visit.    ALLERGIES:  No Known Allergies  PHYSICAL EXAM:  Performance status (ECOG): 1 - Symptomatic but completely ambulatory  Vitals:   06/03/21 1321  BP: 98/69  Pulse: 96  Resp: 18  Temp: (!) 96.7 F (35.9 C)  SpO2: 100%   Wt Readings from Last 3 Encounters:  05/25/21 267 lb 13.7 oz (121.5 kg)  04/30/21 260 lb 12.9 oz (118.3 kg)  03/31/21 256 lb 6.4 oz (116.3 kg)   Physical Exam Vitals reviewed.  Constitutional:  Appearance: Normal appearance.  Cardiovascular:     Rate and Rhythm: Normal rate and regular rhythm.     Pulses: Normal pulses.     Heart sounds: Normal heart sounds.  Pulmonary:     Effort:  Pulmonary effort is normal.     Breath sounds: Normal breath sounds.  Skin:    Findings: Lesion (5 cm on center of right buttock) present.  Neurological:     General: No focal deficit present.     Mental Status: She is alert and oriented to person, place, and time.  Psychiatric:        Mood and Affect: Mood normal.        Behavior: Behavior normal.     LABORATORY DATA:  I have reviewed the labs as listed.  CBC Latest Ref Rng & Units 05/25/2021 05/23/2021 05/22/2021  WBC 4.0 - 10.5 K/uL 7.8 7.8 8.2  Hemoglobin 12.0 - 15.0 g/dL 8.9(L) 9.7(L) 8.9(L)  Hematocrit 36.0 - 46.0 % 28.0(L) 30.6(L) 27.9(L)  Platelets 150 - 400 K/uL PLATELET CLUMPS NOTED ON SMEAR, UNABLE TO ESTIMATE 173 133(L)   CMP Latest Ref Rng & Units 05/25/2021 05/23/2021 05/22/2021  Glucose 70 - 99 mg/dL 86 94 89  BUN 8 - 23 mg/dL $Remove'10 12 13  'JXtRVCp$ Creatinine 0.44 - 1.00 mg/dL 0.67 0.53 0.62  Sodium 135 - 145 mmol/L 136 131(L) 133(L)  Potassium 3.5 - 5.1 mmol/L 4.2 4.3 4.1  Chloride 98 - 111 mmol/L 104 99 103  CO2 22 - 32 mmol/L $RemoveB'25 27 25  'lmzqdzgx$ Calcium 8.9 - 10.3 mg/dL 7.9(L) 7.8(L) 7.7(L)  Total Protein 6.5 - 8.1 g/dL - - -  Total Bilirubin 0.3 - 1.2 mg/dL - - -  Alkaline Phos 38 - 126 U/L - - -  AST 15 - 41 U/L - - -  ALT 0 - 44 U/L - - -    DIAGNOSTIC IMAGING:  I have independently reviewed the scans and discussed with the patient. IR REMOVAL TUN ACCESS W/ PORT W/O FL MOD SED  Result Date: 05/20/2021 CLINICAL DATA:  71 year old female with history of breast cancer and recent bacteremia. Port removal requested. EXAM: REMOVAL OF IMPLANTED TUNNELED PORT-A-CATH MEDICATIONS: None. ANESTHESIA/SEDATION: Moderate (conscious) sedation was employed during this procedure. A total of Versed 2 mg and Fentanyl 50 mcg was administered intravenously. Moderate Sedation Time: 20 minutes. The patient's level of consciousness and vital signs were monitored continuously by radiology nursing throughout the procedure under my direct supervision.  FLUOROSCOPY TIME:  None PROCEDURE: Informed written consent was obtained from the patient after a discussion of the risk, benefits and alternatives to the procedure. The patient was positioned supine on the fluoroscopy table and the right chest Port-A-Cath site was prepped with chlorhexidine. A sterile gown and gloves were worn during the procedure. Local anesthesia was provided with 1% lidocaine with epinephrine. A timeout was performed prior to the initiation of the procedure. An incision was made overlying the Port-A-Cath with a #15 scalpel. Utilizing sharp and blunt dissection, the Port-A-Cath was removed completely. The pocked was irrigated with sterile saline. Wound closure was performed with deep dermal 2-0 and Dermabond. A dressing was placed. The patient tolerated the procedure well without immediate post procedural complication. FINDINGS: Successful removal of implant Port-A-Cath without immediate post procedural complication. IMPRESSION: Successful removal of implanted Port-A-Cath. Ruthann Cancer, MD [facility] Electronically Signed   By: Ruthann Cancer M.D.   On: 05/20/2021 13:56   DG Chest Port 1 View  Result Date:  05/16/2021 CLINICAL DATA:  Short of breath.  COVID-19. EXAM: PORTABLE CHEST 1 VIEW COMPARISON:  04/29/2021 and older exams. FINDINGS: Cardiac silhouette normal in size.  No mediastinal or hilar masses. Stable elevation of the right hemidiaphragm. Lungs are clear. No convincing pleural effusion and no pneumothorax. Right internal jugular Port-A-Cath is also stable, tip projecting in the right atrium. IMPRESSION: 1. No acute cardiopulmonary disease. Electronically Signed   By: Lajean Manes M.D.   On: 05/16/2021 09:11   DG Knee Left Port  Result Date: 05/21/2021 CLINICAL DATA:  Swollen knee EXAM: PORTABLE LEFT KNEE - 1-2 VIEW COMPARISON:  None. FINDINGS: Patient not able to fully cooperate with positioning. Suboptimal  projections. 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 No acute fracture or mass lesion.  No joint effusion IMPRESSION: Tricompartmental degenerative change.  No acute abnormality. Electronically Signed   By: Franchot Gallo M.D.   On: 05/21/2021 11:12   DG HIP UNILAT WITH PELVIS 2-3 VIEWS LEFT  Result Date: 05/18/2021 CLINICAL DATA:  Chronic left hip pain. EXAM: DG HIP (WITH OR WITHOUT PELVIS) 2-3V LEFT COMPARISON:  None. FINDINGS: There is no evidence of an acute hip fracture or dislocation. Marked severity chronic and degenerative changes are seen in the form of joint space narrowing, acetabular sclerosis and subchondral cyst formation. Lobulated, calcified uterine fibroids are suspected. IMPRESSION: Marked severity chronic and degenerative changes in the left hip. Electronically Signed   By: Virgina Norfolk M.D.   On: 05/18/2021 20:46     ASSESSMENT:  1.  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  Right breast upper outer quadrant biopsy was also positive for malignancy. -MRI of the breast on 12/10/2019 shows left breast malignancy involving all 4 quadrants and skin of the left breast including 0.5 cm biopsy-proven malignancy within the far posterior outer right breast, abnormal highly suspicious nonmuscle like enhancement within all 4 quadrants of the left breast and diffuse skin thickening.  This abnormal non-mass-like enhancement and skin thickening extends across the midline to the  medial right breast.  At least 7 abnormal left axillary lymph nodes and 1 abnormal right axillary lymph node compatible with metastatic disease. -CT CAP on 12/12/2019 shows bulky lymphadenopathy in the left subpectoral and axillary regions with largest lymph node measuring 3.3 cm.  Mild mediastinal  adenopathy in the right paratracheal prevascular and lateral aortic regions, largest lymph node in the lateral aortic region measuring 2.4 cm.  Lymphadenopathy in the inferior jugular and supraclavicular regions, left side greater than right.  Mild lymphadenopathy in the right axilla. -Multiple small hypervascular lesions seen in the right and left lobes consistent with liver metastasis. -Bone scan on 12/12/2019 did not show any bone metastasis. -CT CAP on 12/29/2019 showed possible lytic lesion in the T4 vertebral body. -Liver biopsy on 12/28/2019 consistent with metastatic breast cancer. -Paclitaxel weekly 3 weeks on 1 week off started on 12/31/2019. -CT CAP on 03/20/2020 shows mild decrease in the left axillary, subpectoral, supraclavicular, mediastinal and lower jugular lymphadenopathy.  Stable small low-attenuation liver lesion.  No new progressive metastatic disease.  Stable lesion in the left sternal manubrium and T4 vertebral body.  Stable short segment concentric wall thickening of the right colon with subcentimeter pericolonic lymph nodes.  We will monitor this finding as she has metastatic breast cancer.  She is asymptomatic. -CT CAP  on 06/18/1998 6:22 cycles of chemotherapy showed no new or progressive metastatic disease. Similar size of the index and nonindex left axillary subpectoral, supraclavicular and lower jugular lymphadenopathy. - 3 cycles of Doxil from 10/02/2020 through 12/02/2020.   2.  Right colonic mass: -CT scan showed constricting mass involving the ascending colon measuring 3.6 x 3.5 cm with a small 1 cm right pericolonic lymph node. -She never had a colonoscopy.  Denies any bleeding per rectum or melena.   3.  Left leg DVT: -Doppler on 03/03/2020 shows left femoral vein, popliteal vein and posterior tibial vein deep vein thrombosis.   PLAN:  1.  Metastatic left breast cancer to the liver: - She has completed 3 cycles of carboplatin and paclitaxel on 04/02/2021. - She had  restaging scan on 04/09/2021 which showed mild progression. - We have also reviewed CT scan from 04/27/2021 which showed stable disease when compared to 04/09/2021. - Unfortunately she was hospitalized with E. coli infection.  Port had to be removed.  She completed antibiotics last week.  She is currently at a rehab facility at [facility].  I have reviewed all her hospitalization records. - She denies any fevers or chills. - She had right buttock decub ulcer measuring about 5 to 6 cm with clean base and pink granulation tissue. - She is in good positive spirits.  We discussed options including best supportive care in the form of hospice versus active therapy.  She wants to receive active therapy for her cancer. - I do not believe she is a candidate for any aggressive chemotherapy at this time.  She is pending most of the day in the bed and has been sitting up in the chair lately.  They are planning to ambulate her in a wheelchair and subsequently make her walk at the rehab center.  She is also motivated. - We discussed options including CDK 4 inhibitor/antiestrogen therapy versus Enhertu in the setting of her to low disease.  Her initial HER2 testing showed 1+ reactivity. - Upon further discussion, we have opted to start on Enhertu.  Her most recent echocardiogram reviewed by me showed normal ejection fraction. - We discussed side effects including but not limited to diarrhea, rare chance of interstitial lung disease, alopecia, skin rash, decreased blood counts among others.  We also discussed rare chance of CHF.  We will continue monitoring of ejection fraction periodically. - We will ask for port placement and initiate Enhertu after the port is placed.   2.  Right colonic adenocarcinoma: - She has right colon mass and adenopathy. - Surgical intervention was held off due to advanced nature of breast cancer.   3.  Opioid-induced constipation: - She is currently taking docusate 2 tablets  daily and MiraLAX twice daily.  Still she is having a lot of constipation. - Recommend starting Movantik 25 mg daily for opioid-induced constipation.   4.  Left leg DVT: - She is continuing Eliquis twice daily.  No bleeding episodes reported.   5.  Postmenopausal bleeding: - She had endometrial biopsy during recent hospitalization.  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  6.  Left arm pain: - She feels some pressure but denies any pain. - However she has left hip and knee pain from arthritis. - She is on fentanyl and Dilaudid as needed.   Orders placed this encounter:  No orders of the defined types were placed in this encounter.    Doreatha Massed, MD [facility] [phone]  I, Thana Ates, am acting as a Education administrator for Dr. [name].  I, [name] MD, have reviewed the above documentation for accuracy and completeness, and I agree with the above.

## 2021-06-03 ENCOUNTER — Other Ambulatory Visit: Payer: Self-pay

## 2021-06-03 ENCOUNTER — Inpatient Hospital Stay (HOSPITAL_COMMUNITY): Payer: Medicare HMO | Attending: Hematology | Admitting: Hematology

## 2021-06-03 VITALS — BP 98/69 | HR 96 | Temp 96.7°F | Resp 18

## 2021-06-03 DIAGNOSIS — Z7901 Long term (current) use of anticoagulants: Secondary | ICD-10-CM | POA: Diagnosis not present

## 2021-06-03 DIAGNOSIS — Z87891 Personal history of nicotine dependence: Secondary | ICD-10-CM | POA: Insufficient documentation

## 2021-06-03 DIAGNOSIS — Z5112 Encounter for antineoplastic immunotherapy: Secondary | ICD-10-CM | POA: Diagnosis not present

## 2021-06-03 DIAGNOSIS — Z86718 Personal history of other venous thrombosis and embolism: Secondary | ICD-10-CM | POA: Insufficient documentation

## 2021-06-03 DIAGNOSIS — C50611 Malignant neoplasm of axillary tail of right female breast: Secondary | ICD-10-CM | POA: Insufficient documentation

## 2021-06-03 DIAGNOSIS — N871 Moderate cervical dysplasia: Secondary | ICD-10-CM | POA: Insufficient documentation

## 2021-06-03 DIAGNOSIS — M25552 Pain in left hip: Secondary | ICD-10-CM | POA: Diagnosis not present

## 2021-06-03 DIAGNOSIS — Z9221 Personal history of antineoplastic chemotherapy: Secondary | ICD-10-CM | POA: Insufficient documentation

## 2021-06-03 DIAGNOSIS — Z170 Estrogen receptor positive status [ER+]: Secondary | ICD-10-CM | POA: Insufficient documentation

## 2021-06-03 DIAGNOSIS — Z79899 Other long term (current) drug therapy: Secondary | ICD-10-CM | POA: Insufficient documentation

## 2021-06-03 DIAGNOSIS — C50812 Malignant neoplasm of overlapping sites of left female breast: Secondary | ICD-10-CM | POA: Insufficient documentation

## 2021-06-03 DIAGNOSIS — Z5111 Encounter for antineoplastic chemotherapy: Secondary | ICD-10-CM | POA: Diagnosis present

## 2021-06-03 NOTE — Progress Notes (Signed)
ON PATHWAY REGIMEN - Breast  No Change  Continue With Treatment as Ordered.  Original Decision Date/Time: 01/15/2021 18:52     Administer weekly:     Carboplatin   **Always confirm dose/schedule in your pharmacy ordering system**  Patient Characteristics: Distant Metastases or Locoregional Recurrent Disease - Unresected or Locally Advanced Unresectable Disease Progressing after Neoadjuvant and Local Therapies, HER2 Negative/Unknown/Equivocal, ER Positive, Chemotherapy, Third Line and Beyond, Prior or  Contraindicated Anthracycline and Prior Eribulin Therapeutic Status: Distant Metastases ER Status: Positive (+) HER2 Status: Negative (-) PR Status: Negative (-) Therapy Approach Indicated: Standard Chemotherapy/Endocrine Therapy Line of Therapy: Third Line and Beyond Intent of Therapy: Non-Curative / Palliative Intent, Discussed with Patient

## 2021-06-03 NOTE — Progress Notes (Signed)
DISCONTINUE ON PATHWAY REGIMEN - Breast     Administer weekly:     Carboplatin   **Always confirm dose/schedule in your pharmacy ordering system**  REASON: Disease Progression PRIOR TREATMENT: [id number]: Carboplatin AUC=2 Weekly TREATMENT RESPONSE: Progressive Disease (PD)  START OFF PATHWAY REGIMEN - Breast   [id number]:Fam-trastuzumab deruxtecan-nxki 5.4 mg/kg IV D1 q21 Days:   A cycle is every 21 days:     Fam-trastuzumab deruxtecan-nxki   **Always confirm dose/schedule in your pharmacy ordering system**  Patient Characteristics: Distant Metastases or Locoregional Recurrent Disease - Unresected or Locally Advanced Unresectable Disease Progressing after Neoadjuvant and Local Therapies, HER2 Low/Negative/Unknown, ER Positive, Chemotherapy, HER2 Low, Third Line and Beyond, Prior or  Contraindicated Anthracycline and Prior Eribulin Therapeutic Status: Distant Metastases HER2 Status: Low ER Status: Positive (+) PR Status: Negative (-) Therapy Approach Indicated: Standard Chemotherapy/Endocrine Therapy Line of Therapy: Third Line and Beyond Intent of Therapy: Non-Curative / Palliative Intent, Discussed with Patient

## 2021-06-03 NOTE — Patient Instructions (Addendum)
[place] at [facility] Discharge Instructions   You were seen and examined today by Dr. Delton Coombes.  He discussed different treatment options with you - we will start treatment for you on Enhertu, which is an infusion that you will receive in the clinic every 3 weeks.    We will place you on a pill you take daily to help with constipation.  This pill is called Movantik. You may hold this pill if you should develop diarrhea.   Return as scheduled after port placement to start treatment.      Thank you for choosing [place] at [facility] to provide your oncology and hematology care.  To afford each patient quality time with our provider, please arrive at least 15 minutes before your scheduled appointment time.   If you have a lab appointment with the [facility] please come in thru the Main Entrance and check in at the main information desk.  You need to re-schedule your appointment should you arrive 10 or more minutes late.  We strive to give you quality time with our providers, and arriving late affects you and other patients whose appointments are after yours.  Also, if you no show three or more times for appointments you may be dismissed from the clinic at the providers discretion.     Again, thank you for choosing [facility].  Our hope is that these requests will decrease the amount of time that you wait before being seen by our physicians.       _____________________________________________________________  Should you have questions after your visit to [facility], please contact our office at [phone] and follow the prompts.  Our office hours are 8:00 a.m. and 4:30 p.m. Monday - Friday.  Please note that voicemails left after 4:00 p.m. may not be returned until the following business day.  We are closed weekends and major holidays.  You do have access to a nurse 24-7, just call the main number to the  clinic [phone] and do not press any options, hold on the line and a nurse will answer the phone.    For prescription refill requests, have your pharmacy contact our office and allow 72 hours.    Due to Covid, you will need to wear a mask upon entering the hospital. If you do not have a mask, a mask will be given to you at the Main Entrance upon arrival. For doctor visits, patients may have 1 support person age 13 or older with them. For treatment visits, patients can not have anyone with them due to social distancing guidelines and our immunocompromised population.

## 2021-06-12 ENCOUNTER — Other Ambulatory Visit: Payer: Self-pay | Admitting: Radiology

## 2021-06-12 ENCOUNTER — Other Ambulatory Visit (HOSPITAL_COMMUNITY): Payer: Self-pay | Admitting: Physician Assistant

## 2021-06-12 NOTE — Progress Notes (Signed)
Chaplain tried to engage in a follow-up visit with Mrs. Crystal Vega by phone.  She did not answer but her husband did.  Chaplain learned of Crystal Vega's current healthcare state from her husband and how she has been doing through her healthcare changes.  Crystal Vega provided Mrs. Crystal Vega's current location and stated that Mrs. Crystal Vega would be happy to see a visitor.    Chaplain offered listening and support and will personally follow-up with Mrs. Crystal Vega.     06/12/21 1000  Clinical Encounter Type  Visited With Family  Visit Type Spiritual support;Follow-up;Initial

## 2021-06-15 ENCOUNTER — Inpatient Hospital Stay (HOSPITAL_COMMUNITY): Admission: RE | Admit: 2021-06-15 | Payer: Medicare HMO | Source: Ambulatory Visit

## 2021-06-15 ENCOUNTER — Ambulatory Visit (HOSPITAL_COMMUNITY): Payer: Medicare HMO

## 2021-06-16 ENCOUNTER — Other Ambulatory Visit: Payer: Self-pay

## 2021-06-16 ENCOUNTER — Encounter (HOSPITAL_COMMUNITY): Payer: Self-pay | Admitting: Hematology

## 2021-06-16 ENCOUNTER — Other Ambulatory Visit (HOSPITAL_COMMUNITY): Payer: Medicare HMO

## 2021-06-16 ENCOUNTER — Inpatient Hospital Stay (HOSPITAL_COMMUNITY): Payer: Medicare HMO

## 2021-06-16 ENCOUNTER — Inpatient Hospital Stay (HOSPITAL_BASED_OUTPATIENT_CLINIC_OR_DEPARTMENT_OTHER): Payer: Medicare HMO | Admitting: Hematology

## 2021-06-16 VITALS — HR 100 | Temp 98.0°F | Resp 17

## 2021-06-16 VITALS — BP 101/62 | HR 67 | Temp 98.2°F | Resp 18

## 2021-06-16 DIAGNOSIS — C50812 Malignant neoplasm of overlapping sites of left female breast: Secondary | ICD-10-CM | POA: Diagnosis not present

## 2021-06-16 DIAGNOSIS — Z170 Estrogen receptor positive status [ER+]: Secondary | ICD-10-CM | POA: Diagnosis not present

## 2021-06-16 DIAGNOSIS — Z5112 Encounter for antineoplastic immunotherapy: Secondary | ICD-10-CM | POA: Diagnosis not present

## 2021-06-16 DIAGNOSIS — C9000 Multiple myeloma not having achieved remission: Secondary | ICD-10-CM

## 2021-06-16 DIAGNOSIS — Z95828 Presence of other vascular implants and grafts: Secondary | ICD-10-CM

## 2021-06-16 LAB — CBC WITH DIFFERENTIAL/PLATELET
Abs Immature Granulocytes: 0.06 10*3/uL (ref 0.00–0.07)
Basophils Absolute: 0 10*3/uL (ref 0.0–0.1)
Basophils Relative: 0 %
Eosinophils Absolute: 0.1 10*3/uL (ref 0.0–0.5)
Eosinophils Relative: 1 %
HCT: 26.5 % — ABNORMAL LOW (ref 36.0–46.0)
Hemoglobin: 8.2 g/dL — ABNORMAL LOW (ref 12.0–15.0)
Immature Granulocytes: 1 %
Lymphocytes Relative: 19 %
Lymphs Abs: 2 10*3/uL (ref 0.7–4.0)
MCH: 28.5 pg (ref 26.0–34.0)
MCHC: 30.9 g/dL (ref 30.0–36.0)
MCV: 92 fL (ref 80.0–100.0)
Monocytes Absolute: 0.9 10*3/uL (ref 0.1–1.0)
Monocytes Relative: 8 %
Neutro Abs: 7.5 10*3/uL (ref 1.7–7.7)
Neutrophils Relative %: 71 %
Platelets: 247 10*3/uL (ref 150–400)
RBC: 2.88 MIL/uL — ABNORMAL LOW (ref 3.87–5.11)
RDW: 16.3 % — ABNORMAL HIGH (ref 11.5–15.5)
WBC: 10.6 10*3/uL — ABNORMAL HIGH (ref 4.0–10.5)
nRBC: 0 % (ref 0.0–0.2)

## 2021-06-16 LAB — COMPREHENSIVE METABOLIC PANEL
ALT: 12 U/L (ref 0–44)
AST: 29 U/L (ref 15–41)
Albumin: 2.2 g/dL — ABNORMAL LOW (ref 3.5–5.0)
Alkaline Phosphatase: 112 U/L (ref 38–126)
Anion gap: 7 (ref 5–15)
BUN: 15 mg/dL (ref 8–23)
CO2: 25 mmol/L (ref 22–32)
Calcium: 7.9 mg/dL — ABNORMAL LOW (ref 8.9–10.3)
Chloride: 103 mmol/L (ref 98–111)
Creatinine, Ser: 0.68 mg/dL (ref 0.44–1.00)
GFR, Estimated: >60 mL/min (ref >60)
Glucose, Bld: 98 mg/dL (ref 70–99)
Potassium: 4.1 mmol/L (ref 3.5–5.1)
Sodium: 135 mmol/L (ref 135–145)
Total Bilirubin: 0.6 mg/dL (ref 0.3–1.2)
Total Protein: 6.5 g/dL (ref 6.5–8.1)

## 2021-06-16 LAB — MAGNESIUM: Magnesium: 1.6 mg/dL — ABNORMAL LOW (ref 1.7–2.4)

## 2021-06-16 MED ORDER — SODIUM CHLORIDE 0.9 % IV SOLN
10.0000 mg | Freq: Once | INTRAVENOUS | Status: AC
  Administered 2021-06-16: 10 mg via INTRAVENOUS
  Filled 2021-06-16: qty 10

## 2021-06-16 MED ORDER — MAGNESIUM SULFATE 2 GM/50ML IV SOLN: 2.0000 g | Freq: Once | INTRAVENOUS | Status: DC

## 2021-06-16 MED ORDER — FAM-TRASTUZUMAB DERUXTECAN-NXKI CHEMO 100 MG IV SOLR
4.9400 mg/kg | Freq: Once | INTRAVENOUS | Status: AC
  Administered 2021-06-16: 600 mg via INTRAVENOUS
  Filled 2021-06-16: qty 30

## 2021-06-16 MED ORDER — DEXTROSE 5 % IV SOLN: Freq: Once | INTRAVENOUS | Status: AC

## 2021-06-16 MED ORDER — ACETAMINOPHEN 325 MG PO TABS
650.0000 mg | ORAL_TABLET | Freq: Once | ORAL | Status: AC
  Administered 2021-06-16: 650 mg via ORAL
  Filled 2021-06-16: qty 2

## 2021-06-16 MED ORDER — PALONOSETRON HCL INJECTION 0.25 MG/5ML
0.2500 mg | Freq: Once | INTRAVENOUS | Status: AC
  Administered 2021-06-16: 0.25 mg via INTRAVENOUS
  Filled 2021-06-16: qty 5

## 2021-06-16 MED ORDER — MAGNESIUM SULFATE 2 GM/50ML IV SOLN
2.0000 g | Freq: Once | INTRAVENOUS | Status: AC
  Administered 2021-06-16: 2 g via INTRAVENOUS
  Filled 2021-06-16: qty 50

## 2021-06-16 MED ORDER — DIPHENHYDRAMINE HCL 25 MG PO CAPS
50.0000 mg | ORAL_CAPSULE | Freq: Once | ORAL | Status: AC
  Administered 2021-06-16: 50 mg via ORAL
  Filled 2021-06-16: qty 2

## 2021-06-16 NOTE — Progress Notes (Signed)
Patient has been examined by Dr. Katragadda, and vital signs and labs have been reviewed. ANC, Creatinine, LFTs, hemoglobin, and platelets are within treatment parameters per M.D. - pt may proceed with treatment.    °

## 2021-06-16 NOTE — Patient Instructions (Signed)
[place] at [facility] Discharge Instructions   You were seen and examined today by Dr. Delton Coombes.  He reviewed your lab work with you - results are normal/stable.  We will proceed with your first treatment of Enhertu today.  Port placement as scheduled on 06/30/2021.  Return as scheduled for lab work, office visit, and treatment.    Thank you for choosing [place] at [facility] to provide your oncology and hematology care.  To afford each patient quality time with our provider, please arrive at least 15 minutes before your scheduled appointment time.   If you have a lab appointment with the [facility] please come in thru the Main Entrance and check in at the main information desk.  You need to re-schedule your appointment should you arrive 10 or more minutes late.  We strive to give you quality time with our providers, and arriving late affects you and other patients whose appointments are after yours.  Also, if you no show three or more times for appointments you may be dismissed from the clinic at the providers discretion.     Again, thank you for choosing [facility].  Our hope is that these requests will decrease the amount of time that you wait before being seen by our physicians.       _____________________________________________________________  Should you have questions after your visit to [facility], please contact our office at [phone] and follow the prompts.  Our office hours are 8:00 a.m. and 4:30 p.m. Monday - Friday.  Please note that voicemails left after 4:00 p.m. may not be returned until the following business day.  We are closed weekends and major holidays.  You do have access to a nurse 24-7, just call the main number to the clinic [phone] and do not press any options, hold on the line and a nurse will answer the phone.    For prescription refill requests, have your pharmacy  contact our office and allow 72 hours.    Due to Covid, you will need to wear a mask upon entering the hospital. If you do not have a mask, a mask will be given to you at the Main Entrance upon arrival. For doctor visits, patients may have 1 support person age 13 or older with them. For treatment visits, patients can not have anyone with them due to social distancing guidelines and our immunocompromised population.

## 2021-06-16 NOTE — Patient Instructions (Signed)
[facility]  Discharge Instructions: Thank you for choosing [place] to provide your oncology and hematology care.  If you have a lab appointment with the [facility], please come in thru the Main Entrance and check in at the main information desk.  Wear comfortable clothing and clothing appropriate for easy access to any Portacath or PICC line.   We strive to give you quality time with your provider. You may need to reschedule your appointment if you arrive late (15 or more minutes).  Arriving late affects you and other patients whose appointments are after yours.  Also, if you miss three or more appointments without notifying the office, you may be dismissed from the clinic at the providers discretion.      For prescription refill requests, have your pharmacy contact our office and allow 72 hours for refills to be completed.    Today you received the following chemotherapy and/or immunotherapy agents Enhertu      To help prevent nausea and vomiting after your treatment, we encourage you to take your nausea medication as directed.  BELOW ARE SYMPTOMS THAT SHOULD BE REPORTED IMMEDIATELY: *FEVER GREATER THAN 100.4 F (38 C) OR HIGHER *CHILLS OR SWEATING *NAUSEA AND VOMITING THAT IS NOT CONTROLLED WITH YOUR NAUSEA MEDICATION *UNUSUAL SHORTNESS OF BREATH *UNUSUAL BRUISING OR BLEEDING *URINARY PROBLEMS (pain or burning when urinating, or frequent urination) *BOWEL PROBLEMS (unusual diarrhea, constipation, pain near the anus) TENDERNESS IN MOUTH AND THROAT WITH OR WITHOUT PRESENCE OF ULCERS (sore throat, sores in mouth, or a toothache) UNUSUAL RASH, SWELLING OR PAIN  UNUSUAL VAGINAL DISCHARGE OR ITCHING   Items with * indicate a potential emergency and should be followed up as soon as possible or go to the Emergency Department if any problems should occur.  Please show the CHEMOTHERAPY ALERT CARD or IMMUNOTHERAPY ALERT CARD at check-in to the Emergency  Department and triage nurse.  Should you have questions after your visit or need to cancel or reschedule your appointment, please contact [facility] [phone]  and follow the prompts.  Office hours are 8:00 a.m. to 4:30 p.m. Monday - Friday. Please note that voicemails left after 4:00 p.m. may not be returned until the following business day.  We are closed weekends and major holidays. You have access to a nurse at all times for urgent questions. Please call the main number to the clinic [phone] and follow the prompts.  For any non-urgent questions, you may also contact your provider using MyChart. We now offer e-Visits for anyone 49 and older to request care online for non-urgent symptoms. For details visit [email or web address].   Also download the MyChart app! Go to the app store, search "MyChart", open the app, select [place], and log in with your MyChart username and password.  Due to Covid, a mask is required upon entering the hospital/clinic. If you do not have a mask, one will be given to you upon arrival. For doctor visits, patients may have 1 support person aged 48 or older with them. For treatment visits, patients cannot have anyone with them due to current Covid guidelines and our immunocompromised population.

## 2021-06-16 NOTE — Progress Notes (Signed)
Received ok to round dose to vial size.  Patient unable to stand so unable to obtain current weight for today.  Ok to proceed per Dr Delton Coombes with 600 mg dose.  T.O. Dr Verlon Au    Pharmacist Chemotherapy Monitoring - Initial Assessment    Anticipated start date: 06/16/21   The following has been reviewed per standard work regarding the patient's treatment regimen: The patient's diagnosis, treatment plan and drug doses, and organ/hematologic function Lab orders and baseline tests specific to treatment regimen  The treatment plan start date, drug sequencing, and pre-medications Prior authorization status  Patient's documented medication list, including drug-drug interaction screen and prescriptions for anti-emetics and supportive care specific to the treatment regimen The drug concentrations, fluid compatibility, administration routes, and timing of the medications to be used The patient's access for treatment and lifetime cumulative dose history, if applicable  The patient's medication allergies and previous infusion related reactions, if applicable   Changes made to treatment plan:  N/A  Follow up needed:  N/A   Wynona Neat, [facility], 06/16/2021  10:50 AM

## 2021-06-16 NOTE — Progress Notes (Signed)
Patient presents today for Enhertu D1C1 per providers order.  Vital signs and labs reviewed by Dr. Delton Coombes.  Message received from Anastasio Champion RN/Dr. Delton Coombes patient okay for treatment.  Peripheral IV started and blood return noted pre and post infusion.  Enhertu infusion given today per MD orders.   Stable during administration.  Vital signs stable.  No complaints at this time.  Discharge from clinic via EMS in stable condition.  Alert and oriented X 3.  Follow up with [facility] as scheduled.

## 2021-06-16 NOTE — Progress Notes (Signed)
Okay to proceed with treatment today per Dr.K

## 2021-06-16 NOTE — Progress Notes (Signed)
Crystal Vega, [place] [address]   CLINIC:  Medical Oncology/Hematology  PCP:  Lemmie Evens, MD [place]. / [place] [address] [phone]   REASON FOR VISIT:  Follow-up for left breast cancer  PRIOR THERAPY:  1. Paclitaxel x 6 cycles from 12/31/2019 to 06/02/2020. 2. Xeloda through 09/17/2020.  NGS Results:  ER/HER-2 positive, PR negative, Ki-67 30%  CURRENT THERAPY: Carboplatin & Taxol D1,8,15 every 4 weeks x 6 cycles  BRIEF ONCOLOGIC HISTORY:  Oncology History  Malignant neoplasm of overlapping sites of left breast in female, estrogen receptor positive ([place])  11/16/2019 Cancer Staging   Staging form: Breast, AJCC 8th Edition - Clinical stage from 11/16/2019: Stage IV (cT4b, cN1, cM1, G3, ER+, PR-, HER2-) - Signed by [name], MD on 12/17/2019    11/28/2019 Initial Diagnosis   Patient noted intermittent left breast pain and swelling for several months. Mammogram showed a 6.2cm mass in the left breast axillary tail with an adjacent 0.9cm mass, 2 abnormal left axillary lymph nodes, 1 abnormal right axillary lymph node, and diffuse left breast edema and skin thickening. Biopsy showed IDC, grade 3 in the left breast and bilateral axillas, HER-2 negative (1+), ER+ 100%, PR- 0%, Ki67 30%.    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 ([place])  11/16/2019 Cancer Staging   Staging form: Breast, AJCC 8th Edition - Clinical stage from 11/16/2019: Stage IIB (cT0, cN1, cM0, G3, ER+, PR-, HER2-) - Signed by [name], NP  on 11/28/2019    11/28/2019 Initial Diagnosis   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 ([place])   02/01/2021 Genetic Testing          CANCER STAGING:  Cancer Staging  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 female, returns for routine follow-up and consideration for next cycle of chemotherapy. Crystal Vega was last seen on 06/03/2021.  Due for cycle #1 of Carboplatin & Taxol today.   Overall, she tells me she has been feeling pretty well. The pain in her left leg has improved although she is not yet able to stand on it. Her constipation has improved but now she reports diarrhea. Her appetite is good, and she denies nausea and vomiting. She denies fevers. She reports slight hair regrowth.   Overall, she feels ready for next cycle of chemo today.   REVIEW OF SYSTEMS:  Review of Systems  Constitutional:  Negative for appetite change, fatigue and fever.  Respiratory:  Positive for  shortness of breath (with exertion).   Cardiovascular:  Positive for leg swelling (L leg).  Gastrointestinal:  Positive for diarrhea. Negative for constipation, nausea and vomiting.  Genitourinary:  Positive for dysuria.   Musculoskeletal:  Positive for arthralgias (3/10 L leg).  Neurological:  Positive for numbness.  Psychiatric/Behavioral:  Positive for sleep disturbance.   All other systems reviewed and are negative.  PAST MEDICAL/SURGICAL HISTORY:  Past Medical History:  Diagnosis Date    Anemia    Arthritis    per patient " left knee"   DVT (deep venous thrombosis) (HCC)    left leg   Dyspnea    Essential hypertension, benign    Family history of cancer of female genital organ    Family history of GI tract cancer    Metastatic breast cancer ([place])    left breast   Port-A-Cath in place 12/25/2019   Type 2 diabetes mellitus [facility])    Past Surgical History:  Procedure Laterality Date   BIOPSY  11/27/2020   Procedure: BIOPSY;  Surgeon: Daneil Dolin, MD;  [facility];  Service: Endoscopy;;   CATARACT EXTRACTION W/ INTRAOCULAR LENS IMPLANT Right    COLONOSCOPY WITH PROPOFOL N/A 11/27/2020   Procedure: COLONOSCOPY WITH PROPOFOL;  Surgeon: Daneil Dolin, MD;  [facility];  Service: Endoscopy;  Laterality: N/A;  9:00am   DILATION AND CURETTAGE OF UTERUS N/A 05/20/2021   Procedure: DILATATION AND CURETTAGE;  Surgeon: Mora Bellman, MD;  Location: [facility];  Service: Gynecology;  Laterality: N/A;   HYSTEROSCOPY WITH D & C N/A 06/05/2014   Procedure: DILATATION AND CURETTAGE /HYSTEROSCOPY;  Surgeon: Florian Buff, MD;  Location: [facility];  Service: Gynecology;  Laterality: N/A;   IR REMOVAL TUN ACCESS W/ PORT W/O FL MOD SED  05/20/2021   POLYPECTOMY N/A 06/05/2014   Procedure: ENDOMETRIAL POLYPECTOMY;  Surgeon: Florian Buff, MD;  Location: [facility];  Service: Gynecology;  Laterality: N/A;   PORTACATH PLACEMENT N/A 12/14/2019   Procedure: INSERTION PORT-A-CATH WITH ULTRASOUND GUIDANCE;  Surgeon: Donnie Mesa, MD;  Location: [facility];  Service: General;  Laterality: N/A;   TUBAL LIGATION      SOCIAL HISTORY:  Social History   Socioeconomic History   Marital status: Married    Spouse name: Crystal Vega   Number of children: 2   Years of education: Not on file   Highest education level: Not on file  Occupational History   Occupation: retired  Tobacco Use   Smoking status: Former    Types: Cigarettes   Smokeless tobacco: Never   Tobacco comments:    quit  about 40+ years ago  Vaping Use   Vaping Use: Never used  Substance and Sexual Activity   Alcohol use: Never   Drug use: No   Sexual activity: Not on file  Other Topics Concern   Not on file  Social History Narrative   Not on file   Social Determinants of Health   Financial Resource Strain: Not on file  Food Insecurity: Not on file  Transportation Needs: Not on file  Physical Activity: Not on file  Stress: Not on file  Social Connections: Not on file  Intimate Partner Violence: Not on file    FAMILY HISTORY:  Family History  Problem Relation Age of Onset   Diabetes Mellitus II Father    Congestive Heart Failure Father    Colon cancer Mother 26       patient not sure if colon vs stomach  Stroke Maternal Grandmother    Cancer Cousin        female reproductive cancer, dx. 50s/60s    CURRENT MEDICATIONS:  Current Outpatient Medications  Medication Sig Dispense Refill   acetaminophen (TYLENOL) 325 MG tablet Take 2 tablets (650 mg total) by mouth every 6 (six) hours as needed for mild pain (or Fever >/= 101). 12 tablet 0   apixaban (ELIQUIS) 5 MG TABS tablet Take 1 tablet (5 mg total) by mouth 2 (two) times daily. 60 tablet 3   Ascorbic Acid (VITAMIN C) 100 MG tablet Take 100 mg by mouth in the morning and at bedtime.     Ensure Max Protein (ENSURE MAX PROTEIN) LIQD Take 330 mLs (11 oz total) by mouth daily. 330 mL 2   fentaNYL (DURAGESIC) 50 MCG/HR Place 1 patch onto the skin every 3 (three) days. 5 patch 0   ferrous sulfate 325 (65 FE) MG tablet Take 325 mg by mouth daily with breakfast.     folic acid (FOLVITE) 1 MG tablet TAKE 1 TABLET BY MOUTH EVERY DAY (Patient taking differently: Take 1 mg by mouth daily.) 90 tablet 1   gabapentin (NEURONTIN) 300 MG capsule Take 1 capsule (300 mg total) by mouth 2 (two) times daily. 90 capsule 3   HYDROcodone-acetaminophen (NORCO/VICODIN) 5-325 MG tablet PLEASE SEE ATTACHED FOR DETAILED DIRECTIONS     HYDROmorphone (DILAUDID) 2 MG  tablet Take 1 tablet (2 mg total) by mouth every 4 (four) hours as needed for severe pain. 30 tablet 0   KLOR-CON M10 10 MEQ tablet Take 10 mEq by mouth daily.     lidocaine (LIDODERM) 5 % Place 1 patch onto the skin daily. Remove & Discard patch within 12 hours or as directed by MD 30 patch 0   magnesium oxide (MAG-OX) 400 (240 Mg) MG tablet Take 2 tablets (800 mg total) by mouth 2 (two) times daily.     megestrol (MEGACE) 40 MG tablet Take 2 tablets (80 mg total) by mouth every 8 (eight) hours.     metFORMIN (GLUCOPHAGE) 500 MG tablet Take 500 mg by mouth 2 (two) times daily with a meal.     metoprolol succinate (TOPROL XL) 25 MG 24 hr tablet Take 1 tablet (25 mg total) by mouth daily. 30 tablet 11   Multiple Vitamin (MULTIVITAMIN WITH MINERALS) TABS tablet Take 1 tablet by mouth daily.     nutrition supplement, JUVEN, (JUVEN) PACK Take 1 packet by mouth 2 (two) times daily between meals. 30 packet 2   pantoprazole (PROTONIX) 40 MG tablet Take 1 tablet (40 mg total) by mouth daily.     polyethylene glycol (MIRALAX / GLYCOLAX) 17 g packet Take 17 g by mouth 2 (two) times daily. 60 each 2   pravastatin (PRAVACHOL) 10 MG tablet Take 10 mg by mouth at bedtime.     senna-docusate (SENOKOT-S) 8.6-50 MG tablet Take 2 tablets by mouth at bedtime. 60 tablet 1   lidocaine-prilocaine (EMLA) cream Apply 1 application topically as needed. (Patient not taking: Reported on 06/03/2021) 30 g 3   ondansetron (ZOFRAN) 4 MG tablet Take 1 tablet (4 mg total) by mouth daily as needed for nausea or vomiting. (Patient not taking: Reported on 06/03/2021) 30 tablet 1   No current facility-administered medications for this visit.    ALLERGIES:  No Known Allergies  PHYSICAL EXAM:  Performance status (ECOG): 1 - Symptomatic but completely ambulatory  Vitals:   06/16/21 0917  Pulse: 100  Resp: 17  Temp: 98  F (36.7 C)  SpO2: 100%   Wt Readings from Last 3 Encounters:  05/25/21 267 lb 13.7 oz (121.5 kg)  04/30/21  260 lb 12.9 oz (118.3 kg)  03/31/21 256 lb 6.4 oz (116.3 kg)   Physical Exam Vitals reviewed.  Constitutional:      Appearance: Normal appearance.  Cardiovascular:     Rate and Rhythm: Normal rate and regular rhythm.     Pulses: Normal pulses.     Heart sounds: Normal heart sounds.  Pulmonary:     Effort: Pulmonary effort is normal.     Breath sounds: Normal breath sounds.  Neurological:     General: No focal deficit present.     Mental Status: She is alert and oriented to person, place, and time.  Psychiatric:        Mood and Affect: Mood normal.        Behavior: Behavior normal.    LABORATORY DATA:  I have reviewed the labs as listed.  CBC Latest Ref Rng & Units 06/16/2021 05/25/2021 05/23/2021  WBC 4.0 - 10.5 K/uL 10.6(H) 7.8 7.8  Hemoglobin 12.0 - 15.0 g/dL 8.2(L) 8.9(L) 9.7(L)  Hematocrit 36.0 - 46.0 % 26.5(L) 28.0(L) 30.6(L)  Platelets 150 - 400 K/uL 247 PLATELET CLUMPS NOTED ON SMEAR, UNABLE TO ESTIMATE 173   CMP Latest Ref Rng & Units 05/25/2021 05/23/2021 05/22/2021  Glucose 70 - 99 mg/dL 86 94 89  BUN 8 - 23 mg/dL _0 Creatinine 0.44 - 1.00 mg/dL 0.67 0.53 0.62  Sodium 135 - 145 mmol/L 136 131(L) 133(L)  Potassium 3.5 - 5.1 mmol/L 4.2 4.3 4.1  Chloride 98 - 111 mmol/L 104 99 103  CO2 22 - 32 mmol/L _1 Calcium 8.9 - 10.3 mg/dL 7.9(L) 7.8(L) 7.7(L)  Total Protein 6.5 - 8.1 g/dL - - -  Total Bilirubin 0.3 - 1.2 mg/dL - - -  Alkaline Phos 38 - 126 U/L - - -  AST 15 - 41 U/L - - -  ALT 0 - 44 U/L - - -    DIAGNOSTIC IMAGING:  I have independently reviewed the scans and discussed with the patient. IR REMOVAL TUN ACCESS W/ PORT W/O FL MOD SED  Result Date: 05/20/2021 CLINICAL DATA:  71 year old female with history of breast cancer and recent bacteremia. Port removal requested. EXAM: REMOVAL OF IMPLANTED TUNNELED PORT-A-CATH MEDICATIONS: None. ANESTHESIA/SEDATION: Moderate (conscious) sedation was employed during this procedure. A total of Versed 2 mg  and Fentanyl 50 mcg was administered intravenously. Moderate Sedation Time: 20 minutes. The patient's level of consciousness and vital signs were monitored continuously by radiology nursing throughout the procedure under my direct supervision. FLUOROSCOPY TIME:  None PROCEDURE: Informed written consent was obtained from the patient after a discussion of the risk, benefits and alternatives to the procedure. The patient was positioned supine on the fluoroscopy table and the right chest Port-A-Cath site was prepped with chlorhexidine. A sterile gown and gloves were worn during the procedure. Local anesthesia was provided with 1% lidocaine with epinephrine. A timeout was performed prior to the initiation of the procedure. An incision was made overlying the Port-A-Cath with a #15 scalpel. Utilizing sharp and blunt dissection, the Port-A-Cath was removed completely. The pocked was irrigated with sterile saline. Wound closure was performed with deep dermal 2-0 and Dermabond. A dressing was placed. The patient tolerated the procedure well without immediate post procedural complication. FINDINGS: Successful removal of implant Port-A-Cath without immediate post procedural complication. IMPRESSION: Successful removal of implanted  Port-A-Cath. Ruthann Cancer, MD [facility] Radiology Electronically Signed   By: Ruthann Cancer M.D.   On: 05/20/2021 13:56   DG Knee Left Port  Result Date: 05/21/2021 CLINICAL DATA:  Swollen knee EXAM: PORTABLE LEFT KNEE - 1-2 VIEW COMPARISON:  None. FINDINGS: Patient not able to fully cooperate with positioning. Suboptimal projections. 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 No acute fracture or mass lesion.  No joint effusion IMPRESSION: Tricompartmental degenerative change.  No acute abnormality. Electronically Signed   By: Franchot Gallo M.D.   On: 05/21/2021  11:12   DG HIP UNILAT WITH PELVIS 2-3 VIEWS LEFT  Result Date: 05/18/2021 CLINICAL DATA:  Chronic left hip pain. EXAM: DG HIP (WITH OR WITHOUT PELVIS) 2-3V LEFT COMPARISON:  None. FINDINGS: There is no evidence of an acute hip fracture or dislocation. Marked severity chronic and degenerative changes are seen in the form of joint space narrowing, acetabular sclerosis and subchondral cyst formation. Lobulated, calcified uterine fibroids are suspected. IMPRESSION: Marked severity chronic and degenerative changes in the left hip. Electronically Signed   By: Virgina Norfolk M.D.   On: 05/18/2021 20:46     ASSESSMENT:  1.  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  Right breast upper outer quadrant biopsy was also positive for malignancy. -MRI of the breast on 12/10/2019 shows left breast malignancy involving all 4 quadrants and skin of the left breast including 0.5 cm biopsy-proven malignancy within the far posterior outer right breast, abnormal highly suspicious nonmuscle like enhancement within all 4 quadrants of the left breast and diffuse skin thickening.  This abnormal non-mass-like enhancement and skin thickening extends across the midline to the  medial right breast.  At least 7 abnormal left axillary lymph nodes and 1 abnormal right axillary lymph node compatible with metastatic disease. -CT CAP on 12/12/2019 shows bulky lymphadenopathy in the left subpectoral and axillary regions with largest lymph node measuring 3.3 cm.  Mild mediastinal adenopathy in the right paratracheal prevascular and lateral aortic regions, largest lymph node in the lateral aortic region measuring 2.4 cm.  Lymphadenopathy in the inferior jugular and supraclavicular regions, left side greater than right.  Mild lymphadenopathy in the right axilla. -Multiple small hypervascular lesions seen in the  right and left lobes consistent with liver metastasis. -Bone scan on 12/12/2019 did not show any bone metastasis. -CT CAP on 12/29/2019 showed possible lytic lesion in the T4 vertebral body. -Liver biopsy on 12/28/2019 consistent with metastatic breast cancer. -Paclitaxel weekly 3 weeks on 1 week off started on 12/31/2019. -CT CAP on 03/20/2020 shows mild decrease in the left axillary, subpectoral, supraclavicular, mediastinal and lower jugular lymphadenopathy.  Stable small low-attenuation liver lesion.  No new progressive metastatic disease.  Stable lesion in the left sternal manubrium and T4 vertebral body.  Stable short segment concentric wall thickening of the right colon with subcentimeter pericolonic lymph nodes.  We will monitor this finding as she has metastatic breast cancer.  She is asymptomatic. -CT CAP on 06/18/1998 6:22 cycles of chemotherapy showed no new or progressive metastatic disease. Similar size of the index and nonindex left axillary subpectoral, supraclavicular and lower jugular lymphadenopathy. - 3 cycles of Doxil from 10/02/2020 through 12/02/2020. - 3 cycles of carboplatin and paclitaxel completed on 04/02/2021 with restaging scan on 04/09/2021 which showed mild progression.  2.  Right colonic mass: -CT scan showed constricting mass involving the ascending colon measuring 3.6 x 3.5 cm with a small 1 cm right pericolonic lymph node. -She never had a colonoscopy.  Denies any bleeding per rectum or melena.   3.  Left leg DVT: -Doppler on 03/03/2020 shows left femoral vein, popliteal vein and posterior tibial vein deep vein thrombosis.   PLAN:  1.  Metastatic left breast cancer to the liver: - She had recent progression on carboplatin and paclitaxel. - We talked about starting her on Enhertu every 3 weeks for at least 3-4 cycles prior to restaging scans. - Reviewed her labs today which showed normal LFTs.  CBC was grossly normal. - Last echocardiogram on 04/30/2021 with ejection  fraction 60-65%. - She will have port placed on 06/30/2021. - In the rehab, she is able to sit by the bedside.  They are planning to make her sit in a wheelchair and likely make her walk in the next few days. - She will proceed with her first dose of Enhertu.  We discussed side effects in detail. - RTC 3 weeks for follow-up with repeat labs and cycle 2.    2.  Right colonic adenocarcinoma: - She has right colon mass and adenopathy.  Surgical intervention was held off due to advanced breast cancer.   3.  Opioid-induced constipation: - Constipation has resolved.  Continue docusate and MiraLAX as needed.   4.  Left leg DVT: - Continue Eliquis twice daily.  No bleeding episodes.   5.  Postmenopausal bleeding: - She had endometrial biopsy during recent hospitalization.  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  6.  Left hip pain: - Continue fentanyl patch and Dilaudid as needed.   Orders placed this encounter:  No orders of the defined types were placed in this encounter.    [name], MD [facility] [phone]   I, Thana Ates, am acting as a scribe for Dr. [name].  I, [name] MD, have reviewed the above documentation for accuracy and completeness, and I agree with the above.

## 2021-06-17 ENCOUNTER — Telehealth: Payer: Self-pay

## 2021-06-17 LAB — CANCER ANTIGEN 27.29: CA 27.29: 136 U/mL — ABNORMAL HIGH (ref 0.0–38.6)

## 2021-06-17 LAB — CANCER ANTIGEN 15-3: CA 15-3: 154 U/mL — ABNORMAL HIGH (ref 0.0–25.0)

## 2021-06-17 NOTE — Telephone Encounter (Signed)
Tried to call patient at her skilled nursing room, no answer at this time. 24 hour follow up call attempted.

## 2021-06-28 ENCOUNTER — Other Ambulatory Visit: Payer: Self-pay

## 2021-06-28 ENCOUNTER — Emergency Department (HOSPITAL_COMMUNITY): Payer: Medicare HMO

## 2021-06-28 ENCOUNTER — Inpatient Hospital Stay (HOSPITAL_COMMUNITY): Payer: Medicare HMO

## 2021-06-28 ENCOUNTER — Other Ambulatory Visit: Payer: Self-pay | Admitting: Radiology

## 2021-06-28 ENCOUNTER — Inpatient Hospital Stay (HOSPITAL_COMMUNITY)
Admission: EM | Admit: 2021-06-28 | Discharge: 2021-07-01 | DRG: 808 | Disposition: E | Payer: Medicare HMO | Source: Skilled Nursing Facility | Attending: Family Medicine | Admitting: Family Medicine

## 2021-06-28 ENCOUNTER — Encounter (HOSPITAL_COMMUNITY): Payer: Self-pay | Admitting: *Deleted

## 2021-06-28 DIAGNOSIS — C50912 Malignant neoplasm of unspecified site of left female breast: Secondary | ICD-10-CM | POA: Diagnosis present

## 2021-06-28 DIAGNOSIS — C771 Secondary and unspecified malignant neoplasm of intrathoracic lymph nodes: Secondary | ICD-10-CM | POA: Diagnosis present

## 2021-06-28 DIAGNOSIS — G9349 Other encephalopathy: Secondary | ICD-10-CM | POA: Diagnosis present

## 2021-06-28 DIAGNOSIS — R5081 Fever presenting with conditions classified elsewhere: Secondary | ICD-10-CM

## 2021-06-28 DIAGNOSIS — Z66 Do not resuscitate: Secondary | ICD-10-CM | POA: Diagnosis present

## 2021-06-28 DIAGNOSIS — C182 Malignant neoplasm of ascending colon: Secondary | ICD-10-CM | POA: Diagnosis present

## 2021-06-28 DIAGNOSIS — E222 Syndrome of inappropriate secretion of antidiuretic hormone: Secondary | ICD-10-CM | POA: Diagnosis present

## 2021-06-28 DIAGNOSIS — I4891 Unspecified atrial fibrillation: Secondary | ICD-10-CM | POA: Diagnosis present

## 2021-06-28 DIAGNOSIS — C50411 Malignant neoplasm of upper-outer quadrant of right female breast: Secondary | ICD-10-CM | POA: Diagnosis present

## 2021-06-28 DIAGNOSIS — Z87891 Personal history of nicotine dependence: Secondary | ICD-10-CM

## 2021-06-28 DIAGNOSIS — E860 Dehydration: Secondary | ICD-10-CM | POA: Diagnosis present

## 2021-06-28 DIAGNOSIS — Z8249 Family history of ischemic heart disease and other diseases of the circulatory system: Secondary | ICD-10-CM

## 2021-06-28 DIAGNOSIS — Z6841 Body Mass Index (BMI) 40.0 and over, adult: Secondary | ICD-10-CM | POA: Diagnosis not present

## 2021-06-28 DIAGNOSIS — D701 Agranulocytosis secondary to cancer chemotherapy: Principal | ICD-10-CM | POA: Diagnosis present

## 2021-06-28 DIAGNOSIS — Z833 Family history of diabetes mellitus: Secondary | ICD-10-CM

## 2021-06-28 DIAGNOSIS — E876 Hypokalemia: Secondary | ICD-10-CM | POA: Diagnosis present

## 2021-06-28 DIAGNOSIS — Z7984 Long term (current) use of oral hypoglycemic drugs: Secondary | ICD-10-CM

## 2021-06-28 DIAGNOSIS — Z20822 Contact with and (suspected) exposure to covid-19: Secondary | ICD-10-CM | POA: Diagnosis present

## 2021-06-28 DIAGNOSIS — I471 Supraventricular tachycardia: Secondary | ICD-10-CM | POA: Diagnosis not present

## 2021-06-28 DIAGNOSIS — Z823 Family history of stroke: Secondary | ICD-10-CM

## 2021-06-28 DIAGNOSIS — D709 Neutropenia, unspecified: Secondary | ICD-10-CM

## 2021-06-28 DIAGNOSIS — A419 Sepsis, unspecified organism: Secondary | ICD-10-CM

## 2021-06-28 DIAGNOSIS — Z452 Encounter for adjustment and management of vascular access device: Secondary | ICD-10-CM

## 2021-06-28 DIAGNOSIS — N179 Acute kidney failure, unspecified: Secondary | ICD-10-CM | POA: Diagnosis present

## 2021-06-28 DIAGNOSIS — M47816 Spondylosis without myelopathy or radiculopathy, lumbar region: Secondary | ICD-10-CM | POA: Diagnosis present

## 2021-06-28 DIAGNOSIS — L89153 Pressure ulcer of sacral region, stage 3: Secondary | ICD-10-CM | POA: Diagnosis present

## 2021-06-28 DIAGNOSIS — L89313 Pressure ulcer of right buttock, stage 3: Secondary | ICD-10-CM | POA: Diagnosis present

## 2021-06-28 DIAGNOSIS — Z800 Family history of malignant neoplasm of digestive organs: Secondary | ICD-10-CM

## 2021-06-28 DIAGNOSIS — Z86718 Personal history of other venous thrombosis and embolism: Secondary | ICD-10-CM | POA: Diagnosis not present

## 2021-06-28 DIAGNOSIS — Z7901 Long term (current) use of anticoagulants: Secondary | ICD-10-CM | POA: Diagnosis not present

## 2021-06-28 DIAGNOSIS — C787 Secondary malignant neoplasm of liver and intrahepatic bile duct: Secondary | ICD-10-CM | POA: Diagnosis present

## 2021-06-28 DIAGNOSIS — E119 Type 2 diabetes mellitus without complications: Secondary | ICD-10-CM | POA: Diagnosis present

## 2021-06-28 DIAGNOSIS — T451X5A Adverse effect of antineoplastic and immunosuppressive drugs, initial encounter: Secondary | ICD-10-CM | POA: Diagnosis present

## 2021-06-28 DIAGNOSIS — R29714 NIHSS score 14: Secondary | ICD-10-CM | POA: Diagnosis present

## 2021-06-28 DIAGNOSIS — R29810 Facial weakness: Secondary | ICD-10-CM | POA: Diagnosis present

## 2021-06-28 DIAGNOSIS — I10 Essential (primary) hypertension: Secondary | ICD-10-CM | POA: Diagnosis present

## 2021-06-28 DIAGNOSIS — R509 Fever, unspecified: Secondary | ICD-10-CM | POA: Diagnosis present

## 2021-06-28 DIAGNOSIS — D61810 Antineoplastic chemotherapy induced pancytopenia: Secondary | ICD-10-CM | POA: Diagnosis present

## 2021-06-28 DIAGNOSIS — E785 Hyperlipidemia, unspecified: Secondary | ICD-10-CM | POA: Diagnosis present

## 2021-06-28 DIAGNOSIS — M5136 Other intervertebral disc degeneration, lumbar region: Secondary | ICD-10-CM | POA: Diagnosis present

## 2021-06-28 DIAGNOSIS — Z79899 Other long term (current) drug therapy: Secondary | ICD-10-CM

## 2021-06-28 HISTORY — DX: Unspecified atrial flutter: I48.92

## 2021-06-28 LAB — LACTIC ACID, PLASMA
Lactic Acid, Venous: 2.9 mmol/L (ref 0.5–1.9)
Lactic Acid, Venous: 3.7 mmol/L (ref 0.5–1.9)

## 2021-06-28 LAB — COMPREHENSIVE METABOLIC PANEL
ALT: 14 U/L (ref 0–44)
AST: 30 U/L (ref 15–41)
Albumin: 2.2 g/dL — ABNORMAL LOW (ref 3.5–5.0)
Alkaline Phosphatase: 98 U/L (ref 38–126)
Anion gap: 12 (ref 5–15)
BUN: 28 mg/dL — ABNORMAL HIGH (ref 8–23)
CO2: 20 mmol/L — ABNORMAL LOW (ref 22–32)
Calcium: 7.5 mg/dL — ABNORMAL LOW (ref 8.9–10.3)
Chloride: 95 mmol/L — ABNORMAL LOW (ref 98–111)
Creatinine, Ser: 1.21 mg/dL — ABNORMAL HIGH (ref 0.44–1.00)
GFR, Estimated: 49 mL/min — ABNORMAL LOW (ref >60)
Glucose, Bld: 175 mg/dL — ABNORMAL HIGH (ref 70–99)
Potassium: 2.8 mmol/L — ABNORMAL LOW (ref 3.5–5.1)
Sodium: 127 mmol/L — ABNORMAL LOW (ref 135–145)
Total Bilirubin: 1 mg/dL (ref 0.3–1.2)
Total Protein: 6.7 g/dL (ref 6.5–8.1)

## 2021-06-28 LAB — BRAIN NATRIURETIC PEPTIDE: B Natriuretic Peptide: 81 pg/mL (ref 0.0–100.0)

## 2021-06-28 LAB — CBC WITH DIFFERENTIAL/PLATELET
Band Neutrophils: 6 %
Basophils Absolute: 0 10*3/uL (ref 0.0–0.1)
Basophils Relative: 0 %
Eosinophils Absolute: 0 10*3/uL (ref 0.0–0.5)
Eosinophils Relative: 0 %
HCT: 23.6 % — ABNORMAL LOW (ref 36.0–46.0)
Hemoglobin: 7.7 g/dL — ABNORMAL LOW (ref 12.0–15.0)
Lymphocytes Relative: 26 %
Lymphs Abs: 0.2 10*3/uL — ABNORMAL LOW (ref 0.7–4.0)
MCH: 28.4 pg (ref 26.0–34.0)
MCHC: 32.6 g/dL (ref 30.0–36.0)
MCV: 87.1 fL (ref 80.0–100.0)
Metamyelocytes Relative: 3 %
Monocytes Absolute: 0.2 10*3/uL (ref 0.1–1.0)
Monocytes Relative: 18 %
Myelocytes: 2 %
Neutro Abs: 0.5 10*3/uL — ABNORMAL LOW (ref 1.7–7.7)
Neutrophils Relative %: 45 %
Platelets: 99 10*3/uL — ABNORMAL LOW (ref 150–400)
RBC: 2.71 MIL/uL — ABNORMAL LOW (ref 3.87–5.11)
RDW: 15.4 % (ref 11.5–15.5)
WBC: 0.9 10*3/uL — CL (ref 4.0–10.5)
nRBC: 7.1 % — ABNORMAL HIGH (ref 0.0–0.2)

## 2021-06-28 LAB — URINALYSIS, ROUTINE W REFLEX MICROSCOPIC
Glucose, UA: NEGATIVE mg/dL
Ketones, ur: NEGATIVE mg/dL
Nitrite: NEGATIVE
Protein, ur: 100 mg/dL — AB
Specific Gravity, Urine: 1.02 (ref 1.005–1.030)
pH: 7 (ref 5.0–8.0)

## 2021-06-28 LAB — BLOOD GAS, VENOUS
Acid-base deficit: 2.8 mmol/L — ABNORMAL HIGH (ref 0.0–2.0)
Bicarbonate: 22.3 mmol/L (ref 20.0–28.0)
Drawn by: 27160
FIO2: 21
O2 Saturation: 88.7 %
Patient temperature: 36.9
pCO2, Ven: 28.1 mmHg — ABNORMAL LOW (ref 44.0–60.0)
pH, Ven: 7.473 — ABNORMAL HIGH (ref 7.250–7.430)
pO2, Ven: 59.6 mmHg — ABNORMAL HIGH (ref 32.0–45.0)

## 2021-06-28 LAB — TROPONIN I (HIGH SENSITIVITY)
Troponin I (High Sensitivity): 36 ng/L — ABNORMAL HIGH (ref <18)
Troponin I (High Sensitivity): 37 ng/L — ABNORMAL HIGH (ref <18)

## 2021-06-28 LAB — URINALYSIS, MICROSCOPIC (REFLEX)

## 2021-06-28 LAB — RESP PANEL BY RT-PCR (FLU A&B, COVID) ARPGX2
Influenza A by PCR: NEGATIVE
Influenza B by PCR: NEGATIVE
SARS Coronavirus 2 by RT PCR: NEGATIVE

## 2021-06-28 LAB — APTT: aPTT: 46 seconds — ABNORMAL HIGH (ref 24–36)

## 2021-06-28 LAB — MAGNESIUM: Magnesium: 1.3 mg/dL — ABNORMAL LOW (ref 1.7–2.4)

## 2021-06-28 LAB — PROTIME-INR
INR: 1.9 — ABNORMAL HIGH (ref 0.8–1.2)
Prothrombin Time: 22.1 seconds — ABNORMAL HIGH (ref 11.4–15.2)

## 2021-06-28 LAB — CBG MONITORING, ED
Glucose-Capillary: 182 mg/dL — ABNORMAL HIGH (ref 70–99)
Glucose-Capillary: 178 mg/dL — ABNORMAL HIGH (ref 70–99)
Glucose-Capillary: 174 mg/dL — ABNORMAL HIGH (ref 70–99)

## 2021-06-28 MED ORDER — ADENOSINE 6 MG/2ML IV SOLN
6.0000 mg | Freq: Once | INTRAVENOUS | Status: AC
  Administered 2021-06-28: 6 mg via INTRAVENOUS
  Filled 2021-06-28: qty 2

## 2021-06-28 MED ORDER — POTASSIUM CHLORIDE IN NACL 40-0.9 MEQ/L-% IV SOLN
INTRAVENOUS | Status: DC
  Filled 2021-06-28 (×3): qty 1000

## 2021-06-28 MED ORDER — SODIUM CHLORIDE 0.9% FLUSH: 3.0000 mL | INTRAVENOUS | Status: DC | PRN

## 2021-06-28 MED ORDER — POLYETHYLENE GLYCOL 3350 17 G PO PACK: 17.0000 g | PACK | Freq: Every day | ORAL | Status: DC | PRN

## 2021-06-28 MED ORDER — APIXABAN 5 MG PO TABS
5.0000 mg | ORAL_TABLET | Freq: Two times a day (BID) | ORAL | Status: DC
  Administered 2021-06-29 (×2): 5 mg via ORAL
  Filled 2021-06-28 (×2): qty 1

## 2021-06-28 MED ORDER — ADENOSINE 6 MG/2ML IV SOLN
12.0000 mg | Freq: Once | INTRAVENOUS | Status: AC
  Administered 2021-06-28: 12 mg via INTRAVENOUS

## 2021-06-28 MED ORDER — DILTIAZEM HCL 25 MG/5ML IV SOLN
20.0000 mg | Freq: Once | INTRAVENOUS | Status: AC
  Administered 2021-06-28: 20 mg via INTRAVENOUS
  Filled 2021-06-28: qty 5

## 2021-06-28 MED ORDER — GABAPENTIN 300 MG PO CAPS
300.0000 mg | ORAL_CAPSULE | Freq: Two times a day (BID) | ORAL | Status: DC
Start: 2021-06-28 — End: 2021-06-29
  Administered 2021-06-29 (×2): 300 mg via ORAL
  Filled 2021-06-28 (×2): qty 1

## 2021-06-28 MED ORDER — FOLIC ACID 1 MG PO TABS
1.0000 mg | ORAL_TABLET | Freq: Every day | ORAL | Status: DC
  Administered 2021-06-29: 1 mg via ORAL
  Filled 2021-06-28: qty 1

## 2021-06-28 MED ORDER — POTASSIUM CHLORIDE CRYS ER 20 MEQ PO TBCR
10.0000 meq | EXTENDED_RELEASE_TABLET | Freq: Every day | ORAL | Status: DC
  Administered 2021-06-29: 10 meq via ORAL
  Filled 2021-06-28: qty 1

## 2021-06-28 MED ORDER — LACTATED RINGERS IV BOLUS (SEPSIS)
2000.0000 mL | Freq: Once | INTRAVENOUS | Status: AC
  Administered 2021-06-28: 2000 mL via INTRAVENOUS

## 2021-06-28 MED ORDER — SODIUM CHLORIDE 0.9 % IV BOLUS
1000.0000 mL | Freq: Once | INTRAVENOUS | Status: AC
  Administered 2021-06-28: 1000 mL via INTRAVENOUS

## 2021-06-28 MED ORDER — SODIUM CHLORIDE 0.9 % IV SOLN
250.0000 mL | INTRAVENOUS | Status: DC | PRN
  Administered 2021-06-29: 250 mL via INTRAVENOUS

## 2021-06-28 MED ORDER — ADENOSINE 6 MG/2ML IV SOLN
INTRAVENOUS | Status: AC
  Filled 2021-06-28: qty 4

## 2021-06-28 MED ORDER — SODIUM CHLORIDE 0.9 % IV SOLN
2.0000 g | INTRAVENOUS | Status: DC
  Administered 2021-06-29: 2 g via INTRAVENOUS
  Filled 2021-06-28: qty 20

## 2021-06-28 MED ORDER — ONDANSETRON HCL 4 MG/2ML IJ SOLN
4.0000 mg | Freq: Four times a day (QID) | INTRAMUSCULAR | Status: DC | PRN
  Administered 2021-06-29: 4 mg via INTRAVENOUS
  Filled 2021-06-28: qty 2

## 2021-06-28 MED ORDER — POLYETHYLENE GLYCOL 3350 17 G PO PACK
17.0000 g | PACK | Freq: Two times a day (BID) | ORAL | Status: DC
  Administered 2021-06-29: 17 g via ORAL
  Filled 2021-06-28: qty 1

## 2021-06-28 MED ORDER — MAGNESIUM OXIDE -MG SUPPLEMENT 400 (240 MG) MG PO TABS
800.0000 mg | ORAL_TABLET | Freq: Two times a day (BID) | ORAL | Status: DC
  Administered 2021-06-29 (×2): 800 mg via ORAL
  Filled 2021-06-28 (×3): qty 2

## 2021-06-28 MED ORDER — POTASSIUM CHLORIDE CRYS ER 20 MEQ PO TBCR
40.0000 meq | EXTENDED_RELEASE_TABLET | ORAL | Status: AC
  Administered 2021-06-29: 40 meq via ORAL
  Filled 2021-06-28 (×2): qty 2

## 2021-06-28 MED ORDER — HYDROCODONE-ACETAMINOPHEN 5-325 MG PO TABS
1.0000 | ORAL_TABLET | ORAL | Status: DC | PRN
  Administered 2021-06-29: 1 via ORAL
  Filled 2021-06-28: qty 1

## 2021-06-28 MED ORDER — STROKE: EARLY STAGES OF RECOVERY BOOK
Freq: Once | Status: DC
  Filled 2021-06-28: qty 1

## 2021-06-28 MED ORDER — ONDANSETRON HCL 4 MG PO TABS: 4.0000 mg | ORAL_TABLET | Freq: Every day | ORAL | Status: DC | PRN

## 2021-06-28 MED ORDER — MAGNESIUM SULFATE 4 GM/100ML IV SOLN
4.0000 g | Freq: Once | INTRAVENOUS | Status: AC
  Administered 2021-06-28: 4 g via INTRAVENOUS
  Filled 2021-06-28: qty 100

## 2021-06-28 MED ORDER — FENTANYL 50 MCG/HR TD PT72
1.0000 | MEDICATED_PATCH | TRANSDERMAL | Status: DC
  Administered 2021-06-29: 1 via TRANSDERMAL
  Filled 2021-06-28: qty 1

## 2021-06-28 MED ORDER — ACETAMINOPHEN 650 MG RE SUPP
650.0000 mg | Freq: Once | RECTAL | Status: AC
  Administered 2021-06-28: 650 mg via RECTAL
  Filled 2021-06-28: qty 1

## 2021-06-28 MED ORDER — SODIUM CHLORIDE 0.9% FLUSH: 3.0000 mL | Freq: Two times a day (BID) | INTRAVENOUS | Status: DC

## 2021-06-28 MED ORDER — TRAZODONE HCL 50 MG PO TABS
50.0000 mg | ORAL_TABLET | Freq: Every evening | ORAL | Status: DC | PRN
  Filled 2021-06-28: qty 1

## 2021-06-28 MED ORDER — ONDANSETRON HCL 4 MG PO TABS: 4.0000 mg | ORAL_TABLET | Freq: Four times a day (QID) | ORAL | Status: DC | PRN

## 2021-06-28 MED ORDER — INSULIN ASPART 100 UNIT/ML IJ SOLN
0.0000 [IU] | Freq: Three times a day (TID) | INTRAMUSCULAR | Status: DC
  Administered 2021-06-28 – 2021-06-29 (×2): 2 [IU] via SUBCUTANEOUS

## 2021-06-28 MED ORDER — VITAMIN C 250 MG PO TABS
125.0000 mg | ORAL_TABLET | Freq: Every day | ORAL | Status: DC
  Filled 2021-06-28 (×3): qty 1

## 2021-06-28 MED ORDER — ADULT MULTIVITAMIN W/MINERALS CH: 1.0000 | ORAL_TABLET | Freq: Every day | ORAL | Status: DC

## 2021-06-28 MED ORDER — VANCOMYCIN HCL IN DEXTROSE 1-5 GM/200ML-% IV SOLN
1000.0000 mg | Freq: Once | INTRAVENOUS | Status: DC
  Filled 2021-06-28: qty 200

## 2021-06-28 MED ORDER — DILTIAZEM HCL-DEXTROSE 125-5 MG/125ML-% IV SOLN (PREMIX)
5.0000 mg/h | INTRAVENOUS | Status: DC
  Administered 2021-06-28: 5 mg/h via INTRAVENOUS
  Administered 2021-06-29: 7.5 mg/h via INTRAVENOUS
  Filled 2021-06-28 (×2): qty 125

## 2021-06-28 MED ORDER — METOPROLOL SUCCINATE ER 25 MG PO TB24
25.0000 mg | ORAL_TABLET | Freq: Every day | ORAL | Status: DC
  Administered 2021-06-28 – 2021-06-29 (×2): 25 mg via ORAL
  Filled 2021-06-28 (×2): qty 1

## 2021-06-28 MED ORDER — IOHEXOL 350 MG/ML SOLN
60.0000 mL | Freq: Once | INTRAVENOUS | Status: AC | PRN
  Administered 2021-06-28: 75 mL via INTRAVENOUS

## 2021-06-28 MED ORDER — LACTATED RINGERS IV SOLN: INTRAVENOUS | Status: DC

## 2021-06-28 MED ORDER — ACETAMINOPHEN 325 MG PO TABS: 650.0000 mg | ORAL_TABLET | Freq: Four times a day (QID) | ORAL | Status: DC | PRN

## 2021-06-28 MED ORDER — FERROUS SULFATE 325 (65 FE) MG PO TABS
325.0000 mg | ORAL_TABLET | Freq: Every day | ORAL | Status: DC
  Administered 2021-06-29: 325 mg via ORAL
  Filled 2021-06-28: qty 1

## 2021-06-28 MED ORDER — SODIUM CHLORIDE 0.9 % IV SOLN
2.0000 g | Freq: Once | INTRAVENOUS | Status: AC
  Administered 2021-06-28: 2 g via INTRAVENOUS
  Filled 2021-06-28: qty 20

## 2021-06-28 MED ORDER — SODIUM CHLORIDE 0.9 % IV BOLUS
1000.0000 mL | Freq: Once | INTRAVENOUS | Status: AC
Start: 2021-06-28 — End: 2021-06-28
  Administered 2021-06-28: 1000 mL via INTRAVENOUS

## 2021-06-28 MED ORDER — PANTOPRAZOLE SODIUM 40 MG PO TBEC
40.0000 mg | DELAYED_RELEASE_TABLET | Freq: Every day | ORAL | Status: DC
  Administered 2021-06-29: 40 mg via ORAL
  Filled 2021-06-28: qty 1

## 2021-06-28 MED ORDER — SODIUM CHLORIDE 0.9% FLUSH
3.0000 mL | Freq: Two times a day (BID) | INTRAVENOUS | Status: DC
  Administered 2021-06-29: 3 mL via INTRAVENOUS

## 2021-06-28 MED ORDER — VANCOMYCIN HCL 1250 MG/250ML IV SOLN
1250.0000 mg | INTRAVENOUS | Status: DC
  Filled 2021-06-28: qty 250

## 2021-06-28 MED ORDER — ACETAMINOPHEN 650 MG RE SUPP: 650.0000 mg | Freq: Four times a day (QID) | RECTAL | Status: DC | PRN

## 2021-06-28 MED ORDER — ENSURE ENLIVE PO LIQD
237.0000 mL | Freq: Every day | ORAL | Status: DC
  Administered 2021-06-29: 237 mL via ORAL
  Filled 2021-06-28: qty 237

## 2021-06-28 MED ORDER — BISACODYL 10 MG RE SUPP: 10.0000 mg | Freq: Every day | RECTAL | Status: DC | PRN

## 2021-06-28 MED ORDER — CHLORHEXIDINE GLUCONATE CLOTH 2 % EX PADS
6.0000 | MEDICATED_PAD | Freq: Every day | CUTANEOUS | Status: DC
  Administered 2021-06-28: 6 via TOPICAL

## 2021-06-28 MED ORDER — SENNOSIDES-DOCUSATE SODIUM 8.6-50 MG PO TABS: 2.0000 | ORAL_TABLET | Freq: Every day | ORAL | Status: DC

## 2021-06-28 MED ORDER — INSULIN ASPART 100 UNIT/ML IJ SOLN: 0.0000 [IU] | Freq: Every day | INTRAMUSCULAR | Status: DC

## 2021-06-28 MED ORDER — ADULT MULTIVITAMIN W/MINERALS CH
1.0000 | ORAL_TABLET | Freq: Every day | ORAL | Status: DC
  Administered 2021-06-29: 1 via ORAL
  Filled 2021-06-28: qty 1

## 2021-06-28 MED ORDER — POTASSIUM CHLORIDE 10 MEQ/100ML IV SOLN
10.0000 meq | INTRAVENOUS | Status: AC
  Administered 2021-06-28 – 2021-06-29 (×4): 10 meq via INTRAVENOUS
  Filled 2021-06-28 (×4): qty 100

## 2021-06-28 MED ORDER — MEGESTROL ACETATE 40 MG PO TABS
80.0000 mg | ORAL_TABLET | Freq: Three times a day (TID) | ORAL | Status: DC
  Administered 2021-06-29: 80 mg via ORAL
  Filled 2021-06-28 (×9): qty 2

## 2021-06-28 MED ORDER — VANCOMYCIN HCL 2000 MG/400ML IV SOLN
2000.0000 mg | Freq: Once | INTRAVENOUS | Status: AC
  Administered 2021-06-28: 2000 mg via INTRAVENOUS
  Filled 2021-06-28: qty 400

## 2021-06-28 MED ORDER — JUVEN PO PACK
1.0000 | PACK | Freq: Two times a day (BID) | ORAL | Status: DC
  Administered 2021-06-29: 1 via ORAL
  Filled 2021-06-28 (×4): qty 1

## 2021-06-28 NOTE — ED Notes (Signed)
Shiloh paged @ 69.

## 2021-06-28 NOTE — H&P (Addendum)
Patient Demographics:    Malee Grays, is a 71 y.o. female  MRN: [id number]   DOB - [birth year]  Admit Date - 06/27/2021  Outpatient Primary MD for the patient is Lemmie Evens, MD   Assessment & Plan:    Principal Problem:   Neutropenic fever ([place]) Active Problems:   SVT (supraventricular tachycardia) ([place])   Patient is being admitted to the hospital due to SVT requiring IV Cardizem for rate control, pancytopenia with neutropenic fevers/SIRS requiring IV antibiotics pending culture data -Dehydration and electrolyte derangements requiring IV replacements   A/p  1)Paroxysmal SVT/Atrial tachycardia - pt with h/o same  -Arrhythmia in the setting of hypomagnesemia, and hypokalemia -EKG with SVT, post adenosine EKG showed sinus tachy - -Episode of  SVT-- ?? either AVNRT or atrial tachycardia.   -SVT with heart rate up to 170s--- patient received IV adenosine and IV fluids in the ED, tachycardia initially improved but then reoccurred--so patient was started on IV Cardizem drip C/n Metoprolol -Echo from 04/30/2021 with preserved EF (60 to 65%) with LVH and no significant concerns --BNP 81, troponin 36 repeat 37 Pt is already on eliquis for h/o DVT  2)Pancytopenia and Neutropenic Fevers/SIRS- related to underlying malignancy and Recent chemo treatment  WBC 0.9, Hgb 7.7, platelets 99 -Lactic acid 2.9 repeat 3.7 - Last chemorx 06/16/21 Iv Vanc/Rocephin pending cultures ?? 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 -Patient is unable to ambulate and complains of severe pain patient is unable to ambulate  -Pain control-c/n topical Duragesic patch and hydrocodone -??  Metastatic disease related C/n -Gabapentin 300 3 times daily  -Lumbar spine x-rays demonstrate DDD and degenerative facet disease without other acute findings    6)Left leg DVT: -Doppler on 03/03/2020 shows left femoral vein, popliteal vein and posterior tibial vein deep vein thrombosis. 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  Right breast upper outer quadrant biopsy was also positive for malignancy. -Pain regimen as above #1 - she is being treated with chemotherapy by Dr. Flonnie Overman chemoRx 06/16/21   8)Type 2 diabetes mellitus  Hold metformin Use Novolog/Humalog Sliding scale insulin with Accu-Cheks/Fingersticks as ordered   9)Right colon adenocarcinoma -Previously Discussed with Dr. Delton Coombes, patient is not a candidate for surgical on aggressive treatment for her colon malignancy due to metastatic breast cancer -Patient with episodes of  recurrent bloody stool suspect bleeding from colon malignancy   10)Social/Ethics--- patient with multiple comorbidities, overall prognosis is poor -Patient is a DNR/DNI   11) sacral and buttock decubitus ulcers--- please see photos in epic, wound care consult requested   12)Generalized Weakness and deconditioning/ambulatory dysfunction--patient will return to SNF for rehab after treatment  13)h/o - High-grade squamous intraepithelial lesion, CIN-2  (high-grade  Dysplasia (endometrial biopsy 05/20/21)--- evaluated by gynecologist December 2022  Disposition/Need for in-Hospital Stay- patient unable to be discharged at this time due to -SVT requiring IV Cardizem for rate control, pancytopenia with neutropenic fevers requiring IV antibiotics pending culture data -Dehydration and electrolyte derangements requiring IV replacements  Status is: Inpatient  Remains inpatient appropriate because:   Dispo: The patient is from: SNF              Anticipated d/c is to: SNF              Anticipated d/c date is: > 3 days              Patient currently is not medically stable to d/c. Barriers: Not Clinically Stable-    With History of - Reviewed by me  Past Medical History:  Diagnosis Date   Anemia    Arthritis    per patient " left knee"   Atrial flutter (H[facility])    DVT (deep venous thrombosis) (H[facility])    left leg   Dyspnea    Essential hypertension, benign    Family history of cancer of female genital organ    Family history of GI tract cancer    Metastatic breast cancer ([place])    left breast   Port-A-Cath in place 12/25/2019   Type 2 diabetes mellitus ([place])       Past Surgical History:  Procedure Laterality Date   BIOPSY  11/27/2020   Procedure: BIOPSY;  Surgeon: Daneil Dolin, MD;  [facility];  Service: Endoscopy;;   CATARACT EXTRACTION W/ INTRAOCULAR LENS IMPLANT Right    COLONOSCOPY WITH PROPOFOL N/A 11/27/2020   Procedure: COLONOSCOPY WITH PROPOFOL;  Surgeon: Daneil Dolin, MD;  [facility];  Service: Endoscopy;  Laterality: N/A;  9:00am   DILATION AND CURETTAGE OF UTERUS N/A 05/20/2021   Procedure: DILATATION AND CURETTAGE;  Surgeon: Mora Bellman, MD;  Location: [facility];  Service: Gynecology;  Laterality: N/A;   HYSTEROSCOPY WITH D & C N/A 06/05/2014   Procedure: DILATATION AND CURETTAGE /HYSTEROSCOPY;  Surgeon: Florian Buff, MD;  Location: [facility];  Service: Gynecology;  Laterality: N/A;   IR REMOVAL  TUN ACCESS W/ PORT W/O FL MOD SED  05/20/2021   POLYPECTOMY N/A 06/05/2014   Procedure: ENDOMETRIAL POLYPECTOMY;  Surgeon: Florian Buff, MD;  Location: [facility];  Service: Gynecology;  Laterality: N/A;   PORTACATH PLACEMENT N/A 12/14/2019   Procedure: INSERTION PORT-A-CATH WITH ULTRASOUND GUIDANCE;  Surgeon: Donnie Mesa, MD;  Location: [facility];  Service: General;  Laterality: N/A;   TUBAL LIGATION      Chief Complaint  Patient presents with   Tachycardia      HPI:    Catia Todorov  is a 71 y.o. female with h/o  advanced breast cancer  with mets  undergoing chemotherapy, history of presumed colon malignancy, h/o - High-grade squamous intraepithelial lesion, CIN-2 (high-grade  Dysplasia (endometrial biopsy 05/20/21),  DM2, HTN, colon mass, history of SVT/ tachyarrhythmias, iron deficiency anemia, h/o DVT who presents from [facility] with persistent tachycardia generalized weakness and poor appetite  for over a day - In the ED patient is found to have a temp of 100.7, SVT with heart rate up to 170s--- patient received IV adenosine and IV fluids in the ED, tachycardia initially improved but then reoccurred--so patient was started on IV Cardizem drip -Nausea but no emesis, no abd pain  -Patient has chronic sacral wounds, -UA is inconclusive for possible UTI -Chest x-ray without acute findings  Lactic acid 2.9 repeat 3.7  -BNP 81, troponin 36 repeat 37 WBC 0.9, Hgb 7.7, platelets 99 Na- 127, K+ 2.8, mag 1.3 creatinine 1.21 (baseline 0.6 to 0.7) Flu and covid neg EKG with SVT, post adenosine EKG showed sinus tachy   Review of systems:    In addition to the HPI above,   A full Review of  Systems was done, all other systems reviewed are negative except as noted above in HPI , .    Social History:  Reviewed by me    Social History   Tobacco Use   Smoking status: Former    Types: Cigarettes   Smokeless tobacco: Never   Tobacco comments:    quit about 40+ years ago  Substance Use  Topics   Alcohol use: Never       Family History :  Reviewed by me    Family History  Problem Relation Age of Onset   Diabetes Mellitus II Father    Congestive Heart Failure Father    Colon cancer Mother 4       patient not sure if colon vs stomach   Stroke Maternal Grandmother    Cancer Cousin        female reproductive cancer, dx. 50s/60s     Home Medications:   Prior to Admission medications   Medication Sig Start Date End Date Taking? Authorizing Provider  acetaminophen (TYLENOL) 325 MG tablet Take 2 tablets (650 mg total) by mouth every 6 (six) hours as needed for mild pain (or Fever >/= 101). 05/05/21  Yes Roxan Hockey, MD  apixaban (ELIQUIS) 5 MG TABS tablet Take 1 tablet (5 mg total) by mouth 2 (two) times daily. 01/30/21  Yes [name], MD  Ascorbic Acid (VITAMIN C) 100 MG tablet Take 100 mg by mouth in the morning and at bedtime.   Yes [provider]  fentaNYL (DURAGESIC) 50 MCG/HR Place 1 patch onto the skin every 3 (three) days. 05/25/21  Yes Bonnielee Haff, MD  ferrous sulfate 325 (65 FE) MG tablet Take 325 mg by mouth daily with breakfast.   Yes [provider]  folic acid (FOLVITE) 1 MG tablet TAKE 1 TABLET BY MOUTH EVERY DAY Patient taking differently: Take 1 mg by mouth daily. 02/09/21  Yes [name], MD  gabapentin (NEURONTIN) 300 MG capsule Take 1 capsule (300 mg total) by mouth 2 (two) times daily. 05/25/21  Yes Bonnielee Haff, MD  HYDROmorphone (DILAUDID) 2 MG tablet Take 1 tablet (2 mg total) by mouth every 4 (four) hours as needed for severe pain. 05/25/21  Yes Bonnielee Haff, MD  KLOR-CON M10 10 MEQ tablet Take 10 mEq by mouth daily. 12/30/20  Yes [provider]  lidocaine (LIDODERM) 5 % Place 1 patch onto the skin daily. Remove & Discard patch within 12 hours or as directed by MD 05/06/21  Yes Roxan Hockey, MD  lidocaine-prilocaine (EMLA) cream Apply 1 application topically as needed. 04/02/21  Yes  [name], MD  magnesium oxide (MAG-OX) 400 (240 Mg) MG tablet Take 2 tablets (800 mg total) by mouth  2 (two) times daily. 05/25/21  Yes Bonnielee Haff, MD  megestrol (MEGACE) 40 MG tablet Take 2 tablets (80 mg total) by mouth every 8 (eight) hours. 05/25/21  Yes Bonnielee Haff, MD  metFORMIN (GLUCOPHAGE) 500 MG tablet Take 500 mg by mouth 2 (two) times daily with a meal.   Yes [provider]  metoprolol succinate (TOPROL XL) 25 MG 24 hr tablet Take 1 tablet (25 mg total) by mouth daily. 05/05/21 05/05/22 Yes Evelina Lore, MD  Multiple Vitamin (MULTIVITAMIN WITH MINERALS) TABS tablet Take 1 tablet by mouth daily. 05/26/21  Yes Bonnielee Haff, MD  pantoprazole (PROTONIX) 40 MG tablet Take 1 tablet (40 mg total) by mouth daily. 05/25/21  Yes Bonnielee Haff, MD  polyethylene glycol (MIRALAX / GLYCOLAX) 17 g packet Take 17 g by mouth 2 (two) times daily. 05/05/21  Yes Eleana Tocco, MD  rosuvastatin (CRESTOR) 5 MG tablet Take 5 mg by mouth daily.   Yes [provider]  senna-docusate (SENOKOT-S) 8.6-50 MG tablet Take 2 tablets by mouth at bedtime. 05/05/21 05/05/22 Yes Tysheka Fanguy, MD  Ensure Max Protein (ENSURE MAX PROTEIN) LIQD Take 330 mLs (11 oz total) by mouth daily. Patient not taking: Reported on 06/03/2021 05/06/21   Roxan Hockey, MD  nutrition supplement, JUVEN, (JUVEN) PACK Take 1 packet by mouth 2 (two) times daily between meals. Patient not taking: Reported on 06/08/2021 05/06/21   Roxan Hockey, MD  ondansetron (ZOFRAN) 4 MG tablet Take 1 tablet (4 mg total) by mouth daily as needed for nausea or vomiting. Patient not taking: Reported on 06/25/2021 05/05/21 05/05/22  Roxan Hockey, MD     Allergies:    No Known Allergies   Physical Exam:   Vitals  Blood pressure 97/64, pulse (!) 135, temperature 98.7 F (37.1 C), temperature source Oral, resp. rate 16, height 5' 3"  (1.6 m), weight 114.8 kg, SpO2 98 %.  Physical Examination: General  appearance - alert,  and in no distress  Mental status - alert, oriented to person, place, and time,  Eyes - sclera anicteric Neck - supple, no JVD elevation , Chest - clear  to auscultation bilaterally, symmetrical air movement,  Heart -S1 and S2 normal, tachycardic,  Abdomen - soft, nontender, nondistended, increased truncal adiposity Neurological - screening mental status exam normal, neck supple without rigidity, cranial nerves II through XII intact, patient with generalized weakness , chronic left-sided weakness  (patient is left-hand dominant )and deconditioning without new focal deficits per se Extremities - no pedal edema noted, intact peripheral pulses  Skin - warm, dry, sacral and buttock wounds please see photos in epic     Data Review:    CBC Recent Labs  Lab 06/26/2021 1009  WBC 0.9*  HGB 7.7*  HCT 23.6*  PLT 99*  MCV 87.1  MCH 28.4  MCHC 32.6  RDW 15.4  LYMPHSABS 0.2*  MONOABS 0.2  EOSABS 0.0  BASOSABS 0.0     Chemistries  Recent Labs  Lab 06/24/2021 1009  NA 127*  K 2.8*  CL 95*  CO2 20*  GLUCOSE 175*  BUN 28*  CREATININE 1.21*  CALCIUM 7.5*  MG 1.3*  AST 30  ALT 14  ALKPHOS 98  BILITOT 1.0   ------------------------------------------------------------------------------------------------------------------ estimated creatinine clearance is 53.6 mL/min (A) (by C-G formula based on SCr of 1.21 mg/dL (H)). ------------------------------------------------------------------------------------------------------------------ No results for input(s): TSH, T4TOTAL, T3FREE, THYROIDAB in the last 72 hours.  Invalid input(s): FREET3   Coagulation profile Recent Labs  Lab 06/20/2021 1009  INR 1.9*   -------------------------------------------------------------------------------------------------------------------  No results for input(s): DDIMER in the last 72  hours. -------------------------------------------------------------------------------------------------------------------  Cardiac Enzymes No results for input(s): CKMB, TROPONINI, MYOGLOBIN in the last 168 hours.  Invalid input(s): CK ------------------------------------------------------------------------------------------------------------------    Component Value Date/Time   BNP 81.0 06/04/2021 1010   Urinalysis    Component Value Date/Time   COLORURINE AMBER (A) 06/16/2021 1009   APPEARANCEUR CLOUDY (A) 06/02/2021 1009   LABSPEC 1.020 06/14/2021 1009   PHURINE 7.0 06/01/2021 1009   GLUCOSEU NEGATIVE 05/31/2021 1009   HGBUR SMALL (A) 06/19/2021 1009   BILIRUBINUR SMALL (A) 06/19/2021 1009   KETONESUR NEGATIVE 06/02/2021 1009   PROTEINUR 100 (A) 06/10/2021 1009   UROBILINOGEN 0.2 05/29/2014 1022   NITRITE NEGATIVE 06/07/2021 1009   LEUKOCYTESUR MODERATE (A) 06/09/2021 1009    ----------------------------------------------------------------------------------------------------------------   Imaging Results:    DG Chest Port 1 View  Result Date: 06/03/2021 CLINICAL DATA:  Questionable sepsis. EXAM: PORTABLE CHEST 1 VIEW COMPARISON:  May 16, 2021 FINDINGS: The heart size and mediastinal contours are within normal limits. Elevation of the right hemidiaphragm. Low lung volumes with bibasilar atelectasis. No visible pleural effusion or pneumothorax. The visualized skeletal structures are unremarkable. IMPRESSION: Low lung volumes with bibasilar atelectasis. Electronically Signed   By: Dahlia Bailiff M.D.   On: 06/17/2021 10:36    Radiological Exams on Admission: DG Chest Port 1 View  Result Date: 06/01/2021 CLINICAL DATA:  Questionable sepsis. EXAM: PORTABLE CHEST 1 VIEW COMPARISON:  May 16, 2021 FINDINGS: The heart size and mediastinal contours are within normal limits. Elevation of the right hemidiaphragm. Low lung volumes with bibasilar atelectasis. No visible  pleural effusion or pneumothorax. The visualized skeletal structures are unremarkable. IMPRESSION: Low lung volumes with bibasilar atelectasis. Electronically Signed   By: Dahlia Bailiff M.D.   On: 06/04/2021 10:36    DVT Prophylaxis -SCD/eliquis AM Labs Ordered, also please review Full Orders  Family Communication: Admission, patients condition and plan of care including tests being ordered have been discussed with the patient and husband who indicate understanding and agree with the plan   Code Status - DNR Likely DC to  to SNF rehab  Condition   stable  Roxan Hockey M.D on 06/08/2021 at 6:56 PM Go to [email or web address] -  for contact info  [facility] - Office  [phone]

## 2021-06-28 NOTE — ED Notes (Signed)
Call placed to Daughter [name] to give update of plan for admission

## 2021-06-28 NOTE — ED Notes (Signed)
Pt ate small amount of pears.  No nausea/vomiting

## 2021-06-28 NOTE — ED Notes (Signed)
Nursing unable to obtain PIV. Dr. Dina Rich attempting US guided IV at this time.

## 2021-06-28 NOTE — Progress Notes (Addendum)
Pharmacy Antibiotic Note  Crystal Vega a 71 y.o. female admitted on 06/10/2021 with cellulitis.  Pharmacy has been consulted for vancomycin dosing.  Plan: Vancomycin 1250mg  IV every 24 hours.  Goal trough 10-15 mcg/mL.  Medical History: Past Medical History:  Diagnosis Date   Anemia    Arthritis    per patient " left knee"   Atrial flutter ([place])    DVT (deep venous thrombosis) (HCC)    left leg   Dyspnea    Essential hypertension, benign    Family history of cancer of female genital organ    Family history of GI tract cancer    Metastatic breast cancer ([place])    left breast   Port-A-Cath in place 12/25/2019   Type 2 diabetes mellitus (H[facility])     Allergies:  No Known Allergies  Filed Weights   06/19/2021 0945  Weight: 114.8 kg (253 lb)    CBC Latest Ref Rng & Units 06/23/2021 06/16/2021 05/25/2021  WBC 4.0 - 10.5 K/uL 0.9(LL) 10.6(H) 7.8  Hemoglobin 12.0 - 15.0 g/dL 7.7(L) 8.2(L) 8.9(L)  Hematocrit 36.0 - 46.0 % 23.6(L) 26.5(L) 28.0(L)  Platelets 150 - 400 K/uL 99(L) 247 PLATELET CLUMPS NOTED ON SMEAR, UNABLE TO ESTIMATE     Estimated Creatinine Clearance: 53.6 mL/min (A) (by C-G formula based on SCr of 1.21 mg/dL (H)).  Antibiotics Given (last 72 hours)     Date/Time Action Medication Dose Rate   06/05/2021 1151 New Bag/Given   cefTRIAXone (ROCEPHIN) 2 g in sodium chloride 0.9 % 100 mL IVPB 2 g 200 mL/hr       Antimicrobials this admission: vancomycin 06/04/2021  >>  Ceftriaxone 06/10/2021 x 1  Microbiology results: 06/27/2021  BCx: sent 06/10/2021  UCx: sent 05/31/2021  Resp Panel: sent  06/18/2021  MRSA PCR: sent  Thank you for allowing pharmacy to be a part of this patients care.  Thomasenia Sales, PharmD Clinical Pharmacist

## 2021-06-28 NOTE — ED Notes (Signed)
Fentanyl patch in place on arrival dated 06/26/21

## 2021-06-28 NOTE — Consult Note (Signed)
[facility] TeleNeurology Consult Services   Patient Name:   Crystal Vega, Crystal Vega Date of Birth:   Sep 15, 1950 Identification Number:   MRN - [id number] Date of Service:   06/07/2021 20:09:52  Diagnosis:       G93.49 - Encephalopathy Multifactorial       I63.412 - Cerebrovascular accident (CVA) due to embolism of left middle cerebral artery (HCCC)  Impression:      71 year old woman, brought in from facility for weakness and SVT. While she was here, she was noted to have a facial droop which is new. L sided weakness, difficulty walking is not new. Not a candidate for thrombolytics, we tried to obtain CTA head/neck, not possible due to no lines possible and central line can not have contrast. admit for infectious/metabolic workup, tele, stroke work up. Neurology following along.  Metrics: Last Known Well: Unknown [facility] Notification Time: 06/30/2021 20:09:52 Stamp Time: 06/23/2021 20:09:52 Initial Response Time: 06/11/2021 20:17:55 Symptoms: Facial droop. NIHSS Start Assessment Time: 06/26/2021 20:23:53 Patient is not a candidate for Thrombolytic. Thrombolytic Medical Decision: 06/27/2021 20:23:19 Patient was not deemed candidate for Thrombolytic because of following reasons: Last Well Known Above 4.5 Hours.  CT head showed no acute hemorrhage or acute core infarct.  ED Physician notified of diagnostic impression and management plan on 06/20/2021 20:40:30  Advanced Imaging: Advanced Imaging Not Completed because:  CTA not possible She is a complicated stick, unable to get a line on her, ED doctor got a central line but CT states it is not possible to get a CTA through that.   Our recommendations are outlined below.  Recommendations:        Stroke/Telemetry Floor       Neuro Checks       Bedside Swallow Eval       DVT Prophylaxis       IV Fluids, Normal Saline       Head of Bed 30 Degrees       Euglycemia and Avoid Hyperthermia (PRN Acetaminophen)        Antihypertensives PRN if Blood pressure is greater than 220/120 or there is a concern for End organ damage/contraindications for permissive HTN. If blood pressure is greater than 220/120 give labetalol PO or IV or Vasotec IV with a goal of 15% reduction in BP during the first 24 hours.  Routine Consultation with [facility] for Follow up Care  Sign Out:       Discussed with Emergency Department Provider       Discussed with Rapid Response Team    ------------------------------------------------------------------------------  History of Present Illness: Patient is a 71 year old Female.  71 year old woman was noted to have L sided facial droop. She comes here for SVT, fevers, and has a wound as well on her sacrum. She is on eloquis, A flutter and DVT. Not a candidate for thrombolytics given unclear time from onset and eloquis use. She has breast cancer, last chemo 1/17. CT head, no abnormality.   Past Medical History:      Hypertension      Hyperlipidemia      Atrial Fibrillation  Medications:  No Anticoagulant use  No Antiplatelet use Reviewed EMR for current medications  Allergies:  Reviewed  Social History: Smoking: No Alcohol Use: No Drug Use: No  Family History:  There Is Family History [id number] There is no family history of premature cerebrovascular disease pertinent to this consultation  ROS : 14 Points Review of Systems was performed and was negative  except mentioned in HPI.  Past Surgical History: There Is No Surgical History Contributory To Todays Visit    Examination: BP(88/71), Pulse(141), 1A: Level of Consciousness - Alert; keenly responsive + 0 1B: Ask Month and Age - Could Not Answer Either Question Correctly + 2 1C: Blink Eyes & Squeeze Hands - Performs Both Tasks + 0 2: Test Horizontal Extraocular Movements - Normal + 0 3: Test Visual Fields - No Visual Loss + 0 4: Test Facial Palsy (Use Grimace if Obtunded) - Partial paralysis (lower  face) + 2 5A: Test Left Arm Motor Drift - Drift, but doesn't hit bed + 1 5B: Test Right Arm Motor Drift - Drift, but doesn't hit bed + 1 6A: Test Left Leg Motor Drift - No Effort Against Gravity + 3 6B: Test Right Leg Motor Drift - No Effort Against Gravity + 3 7: Test Limb Ataxia (FNF/Heel-Shin) - No Ataxia + 0 8: Test Sensation - Mild-Moderate Loss: Can Sense Being Touched + 1 9: Test Language/Aphasia - Normal; No aphasia + 0 10: Test Dysarthria - Mild-Moderate Dysarthria: Slurring but can be understood + 1 11: Test Extinction/Inattention - No abnormality + 0  NIHSS Score: 14   Pre-Morbid Modified Rankin Scale: 4 Points = Moderately severe disability; unable to walk and attend to bodily needs without assistance   Patient/Family was informed the Neurology Consult would occur via TeleHealth consult by way of interactive audio and video telecommunications and consented to receiving care in this manner.   Patient is being evaluated for possible acute neurologic impairment and high probability of imminent or life-threatening deterioration. I spent total of 45 minutes providing care to this patient, including time for face to face visit via telemedicine, review of medical records, imaging studies and discussion of findings with providers, the patient and/or family.   Dr Marry Guan   [facility] [phone]   Case [id number]

## 2021-06-28 NOTE — ED Notes (Signed)
Packaging on CVC  states that it is power injectable. Gave to Rad tech who will follow up with their head .

## 2021-06-28 NOTE — ED Notes (Signed)
RN spoke with Pts daughter [name]. RN was informed the Pts spouse is mentally distraut at this time and unable to make medical decisions regarding treatment decisions of his spouse. There is no Medical POA listed in Pts chart.

## 2021-06-28 NOTE — ED Notes (Signed)
Primary RN to room at  1910 to first assessment. Pt has left side droop. Hospitalist to bedside at 1925

## 2021-06-28 NOTE — Sepsis Progress Note (Signed)
Sepsis protocol is being followed by eLink. 

## 2021-06-28 NOTE — ED Notes (Signed)
Unable to get secund IV via ultrasound

## 2021-06-28 NOTE — ED Notes (Signed)
Primary RN, Agricultural consultant and EDP spoke with Pts daughter [name] and Pt. All parties agreeable to proceed with establish Central Line IV Access for IV infusions.

## 2021-06-28 NOTE — Significant Event (Signed)
Patient's nurse notified me at 7:30 PM on June 28, 2021 with patient noticed to have left facial droop.  On exam at bedside patient have left facial droop slurred speech.  Has mild weakness of the left upper and lower extremity about 4 x 5 able to move right upper and lower extremity 5 x 5.  Pupils are equal and reacting to light.  Tongue is midline.  I discussed with admitting doctor Dr. Joesph Fillers.  Reviewed patient's labs medications.  Given the symptoms we have called code stroke and teleneurology consult.  Patient's husband Mr. Tommy Minichiello updated.  Gean Birchwood

## 2021-06-28 NOTE — ED Provider Notes (Signed)
Patient has very poor access and needs IV access for multiple medications including Cardizem, potassium, fluids.  She was called a code stroke while and admitted patient in the emergency department.  Unfortunately, she only has 1 arm to use and previous ultrasound IV has blown and there is no good access.  Daughter verbally gives consent for central line if needed.  Patient is okay with this as well.  Central line placed as below.  Continue management per hospitalist.  10:12 PM I was now informed by nursing/radiology tech that the central line placed is power injectable and can do a CT angiography.  Thus will order CTA head and neck as neuro had originally recommended.  Linus Salmons Line  Date/Time: 06/18/2021 9:32 PM Performed by: Sherwood Gambler, MD Authorized by: Sherwood Gambler, MD   Consent:    Consent obtained:  Verbal   Consent given by:  Patient   Risks, benefits, and alternatives were discussed: yes   Universal protocol:    Patient identity confirmed:  Verbally with patient Pre-procedure details:    Indication(s): insufficient peripheral access     Hand hygiene: Hand hygiene performed prior to insertion     Sterile barrier technique: All elements of maximal sterile technique followed     Skin preparation:  Chlorhexidine   Skin preparation agent: Skin preparation agent completely dried prior to procedure   Sedation:    Sedation type:  None Anesthesia:    Anesthesia method:  Local infiltration   Local anesthetic:  Lidocaine 1% w/o epi Procedure details:    Location:  R internal jugular   Patient position:  Trendelenburg   Procedural supplies:  Triple lumen   Landmarks identified: yes     Ultrasound guidance: yes     Ultrasound guidance timing: real time     Sterile ultrasound techniques: Sterile gel and sterile probe covers were used     Number of attempts:  1   Successful placement: yes   Post-procedure details:    Post-procedure:  Dressing applied   Assessment:  Blood  return through all ports, free fluid flow, no pneumothorax on x-ray and placement verified by x-ray   Procedure completion:  Tolerated well, no immediate complications .Critical Care Performed by: Sherwood Gambler, MD Authorized by: Sherwood Gambler, MD   Critical care provider statement:    Critical care time (minutes):  30   Critical care time was exclusive of:  Separately billable procedures and treating other patients   Critical care was necessary to treat or prevent imminent or life-threatening deterioration of the following conditions:  CNS failure or compromise   Critical care was time spent personally by me on the following activities:  Development of treatment plan with patient or surrogate, discussions with consultants, evaluation of patient's response to treatment, examination of patient, ordering and review of laboratory studies, ordering and review of radiographic studies, ordering and performing treatments and interventions, pulse oximetry, re-evaluation of patient's condition and review of old charts    Sherwood Gambler, MD 06/12/2021 2300

## 2021-06-28 NOTE — ED Notes (Signed)
Pt incontinent of moderate amount loose stool.  Peri care provided.  2cm open area noted to right buttock, no dressing in place on arrival.  DSD placed to keep area clean and dry, recommend wound care consult.  Purewick placed

## 2021-06-28 NOTE — Sepsis Progress Note (Signed)
Notified provider of need to order repeat lactic acid since the 2nd LA was higher than the first. Order placed.

## 2021-06-28 NOTE — ED Triage Notes (Signed)
Pt brought in by [facility] from [place] home with c/o tachycardia, weakness and decreased appetite for unknown amount of time. Upon EMS arrival, pt's HR was found to be 180, SVT, fever of 100.7, CBG 203. Pt has sacral wound.

## 2021-06-28 NOTE — Sepsis Progress Note (Signed)
Secure chat with bedside nurse to f/u on lactic acid.

## 2021-06-28 NOTE — Sepsis Progress Note (Signed)
Notified bedside nurse of need to draw lactic acid. It has been drawn and waiting on lab to pick it up.

## 2021-06-28 NOTE — ED Provider Notes (Signed)
[facility] Provider Note   CSN: [id number] Arrival date & time: 06/06/2021  0941     History  Chief Complaint  Patient presents with   Tachycardia    Crystal Vega is a 71 y.o. female.  HPI  71 year old female past medical history of obesity, HTN, HLD, DM, metastatic breast cancer, previous DVT and a flutter anticoagulated on Eliquis presents to the emergency department from [facility] with concern for tachycardia and weakness.  History limited secondary to acuity.  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  Patient at this time relates no acute complaints but appears very fatigued.  Home Medications Prior to Admission medications   Medication Sig Start Date End Date Taking? Authorizing Provider  acetaminophen (TYLENOL) 325 MG tablet Take 2 tablets (650 mg total) by mouth every 6 (six) hours as needed for mild pain (or Fever >/= 101). Patient not taking: Reported on 06/16/2021 05/05/21   Roxan Hockey, MD  apixaban (ELIQUIS) 5 MG TABS tablet Take 1 tablet (5 mg total) by mouth 2 (two) times daily. 01/30/21   [name], MD  Ascorbic Acid (VITAMIN C) 100 MG tablet Take 100 mg by mouth in the morning and at bedtime.    [provider]  Ensure Max Protein (ENSURE MAX PROTEIN) LIQD Take 330 mLs (11 oz total) by mouth daily. 05/06/21   Roxan Hockey, MD  fentaNYL (DURAGESIC) 50 MCG/HR Place 1 patch onto the skin every 3 (three) days. 05/25/21   Bonnielee Haff, MD  ferrous sulfate 325 (65 FE) MG tablet Take 325 mg by mouth daily with breakfast.    [provider]  folic acid (FOLVITE) 1 MG tablet TAKE 1 TABLET BY MOUTH EVERY DAY Patient taking differently: Take 1 mg by mouth daily. 02/09/21   [name], MD  gabapentin (NEURONTIN) 300 MG capsule Take 1 capsule (300 mg total) by mouth 2 (two) times daily. 05/25/21   Bonnielee Haff, MD  HYDROcodone-acetaminophen (NORCO/VICODIN) 5-325 MG  tablet PLEASE SEE ATTACHED FOR DETAILED DIRECTIONS 05/22/21   [provider]  HYDROmorphone (DILAUDID) 2 MG tablet Take 1 tablet (2 mg total) by mouth every 4 (four) hours as needed for severe pain. Patient not taking: Reported on 06/16/2021 05/25/21   Bonnielee Haff, MD  KLOR-CON M10 10 MEQ tablet Take 10 mEq by mouth daily. 12/30/20   [provider]  lidocaine (LIDODERM) 5 % Place 1 patch onto the skin daily. Remove & Discard patch within 12 hours or as directed by MD 05/06/21   Roxan Hockey, MD  lidocaine-prilocaine (EMLA) cream Apply 1 application topically as needed. 04/02/21   [name], MD  magnesium oxide (MAG-OX) 400 (240 Mg) MG tablet Take 2 tablets (800 mg total) by mouth 2 (two) times daily. 05/25/21   Bonnielee Haff, MD  megestrol (MEGACE) 40 MG tablet Take 2 tablets (80 mg total) by mouth every 8 (eight) hours. 05/25/21   Bonnielee Haff, MD  metFORMIN (GLUCOPHAGE) 500 MG tablet Take 500 mg by mouth 2 (two) times daily with a meal.    [provider]  metoprolol succinate (TOPROL XL) 25 MG 24 hr tablet Take 1 tablet (25 mg total) by mouth daily. 05/05/21 05/05/22  Roxan Hockey, MD  Multiple Vitamin (MULTIVITAMIN WITH MINERALS) TABS tablet Take 1 tablet by mouth daily. 05/26/21   Bonnielee Haff, MD  nutrition supplement, JUVEN, (JUVEN) PACK Take 1 packet by mouth 2 (two) times daily between meals. 05/06/21   Emokpae,  Courage, MD  ondansetron (ZOFRAN) 4 MG tablet Take 1 tablet (4 mg total) by mouth daily as needed for nausea or vomiting. 05/05/21 05/05/22  Roxan Hockey, MD  pantoprazole (PROTONIX) 40 MG tablet Take 1 tablet (40 mg total) by mouth daily. 05/25/21   Bonnielee Haff, MD  polyethylene glycol (MIRALAX / GLYCOLAX) 17 g packet Take 17 g by mouth 2 (two) times daily. 05/05/21   Roxan Hockey, MD  pravastatin (PRAVACHOL) 10 MG tablet Take 10 mg by mouth at bedtime.    [provider]  senna-docusate (SENOKOT-S) 8.6-50 MG  tablet Take 2 tablets by mouth at bedtime. 05/05/21 05/05/22  Roxan Hockey, MD      Allergies    Patient has no known allergies.    Review of Systems   Review of Systems  Unable to perform ROS: Acuity of condition   Physical Exam Updated Vital Signs BP 109/73    Pulse (!) 169    Temp (!) 100.4 F (38 C) (Oral)    Resp 19    Ht 5\' 3"  (1.6 m)    Wt 114.8 kg    SpO2 97%    BMI 44.82 kg/m  Physical Exam Vitals and nursing note reviewed.  Constitutional:      Appearance: Normal appearance. She is obese. She is ill-appearing. She is not diaphoretic.  HENT:     Head: Normocephalic.     Mouth/Throat:     Mouth: Mucous membranes are moist.  Eyes:     Comments: Left cloudy pupil  Cardiovascular:     Rate and Rhythm: Tachycardia present.  Pulmonary:     Effort: Pulmonary effort is normal. No respiratory distress.     Breath sounds: No wheezing.  Abdominal:     Palpations: Abdomen is soft.     Tenderness: There is no abdominal tenderness.  Skin:    General: Skin is warm.  Neurological:     Mental Status: She is alert and oriented to person, place, and time.    ED Results / Procedures / Treatments   Labs (all labs ordered are listed, but only abnormal results are displayed) Labs Reviewed  PROTIME-INR - Abnormal; Notable for the following components:      Result Value   Prothrombin Time 22.1 (*)    INR 1.9 (*)    All other components within normal limits  APTT - Abnormal; Notable for the following components:   aPTT 46 (*)    All other components within normal limits  RESP PANEL BY RT-PCR (FLU A&B, COVID) ARPGX2  CULTURE, BLOOD (ROUTINE X 2)  CULTURE, BLOOD (ROUTINE X 2)  URINE CULTURE  BRAIN NATRIURETIC PEPTIDE  LACTIC ACID, PLASMA  LACTIC ACID, PLASMA  COMPREHENSIVE METABOLIC PANEL  CBC WITH DIFFERENTIAL/PLATELET  URINALYSIS, ROUTINE W REFLEX MICROSCOPIC  MAGNESIUM  BLOOD GAS, VENOUS  TROPONIN I (HIGH SENSITIVITY)    EKG None  Radiology No results  found.  Procedures Ultrasound ED Peripheral IV (Provider)  Date/Time: 06/11/2021 10:42 AM Performed by: Lorelle Gibbs, DO Authorized by: Lorelle Gibbs, DO   Procedure details:    Indications: multiple failed IV attempts and poor IV access     Skin Prep: chlorhexidine gluconate     Location:  Right AC   Angiocath:  20 G   Bedside Ultrasound Guided: Yes     Images: not archived     Patient tolerated procedure without complications: Yes     Dressing applied: Yes   .Critical Care Performed by: Lorelle Gibbs, DO  Authorized by: Lorelle Gibbs, DO   Critical care provider statement:    Critical care time (minutes):  75   Critical care time was exclusive of:  Separately billable procedures and treating other patients   Critical care was necessary to treat or prevent imminent or life-threatening deterioration of the following conditions:  Cardiac failure, circulatory failure, sepsis and shock   Critical care was time spent personally by me on the following activities:  Development of treatment plan with patient or surrogate, discussions with consultants, evaluation of patient's response to treatment, examination of patient, ordering and review of laboratory studies, ordering and review of radiographic studies, ordering and performing treatments and interventions, pulse oximetry, re-evaluation of patient's condition and review of old charts   I assumed direction of critical care for this patient from another provider in my specialty: no     Care discussed with: admitting provider      Medications Ordered in ED Medications  lactated ringers infusion (has no administration in time range)  lactated ringers bolus 2,000 mL (has no administration in time range)  adenosine (ADENOCARD) 6 MG/2ML injection (has no administration in time range)  diltiazem (CARDIZEM) injection 20 mg (has no administration in time range)  acetaminophen (TYLENOL) suppository 650 mg (has no administration  in time range)  adenosine (ADENOCARD) 6 MG/2ML injection 6 mg (6 mg Intravenous Given by Other 06/18/2021 1025)  adenosine (ADENOCARD) 6 MG/2ML injection 12 mg (12 mg Intravenous Given by Other 06/22/2021 1031)    ED Course/ Medical Decision Making/ A&P                           Medical Decision Making Amount and/or Complexity of Data Reviewed Labs: ordered. Radiology: ordered. ECG/medicine tests: ordered.  Risk OTC drugs. Prescription drug management.   This patient presents to the ED for concern of fever and tachycardia, this involves an extensive number of treatment options, and is a complaint that carries with it a high risk of complications and morbidity.  The differential diagnosis includes sepsis, infection, arrhythmia/SVT   Additional history obtained: -Additional history obtained from EMS, facility notes -External records from outside source obtained and reviewed including: Chart review including previous notes, labs, imaging, consultation notes   Lab Tests: -I ordered, reviewed, and interpreted labs.  The pertinent results include: Leukopenia, thrombocytopenia, elevated lactic, mild dehydration, mildly elevated troponin   EKG -Initially showed SVT transitioning to sinus tachycardia   Imaging Studies ordered: -I ordered imaging studies including chest x-ray -I independently visualized and interpreted imaging which showed no acute finding -I agree with the radiologist interpretation   Medicines ordered and prescription drug management: -I ordered medication including IV fluids, antibiotics, adenosine, Cardizem for symptoms -Reevaluation of the patient after these medicines showed that the patient improved -I have reviewed the patients home medicines and have made adjustments as needed   Consultations Obtained: I requested consultation with the ICU,  and discussed lab and imaging findings as well as pertinent plan - they recommend: Medical admission, continue to  titrate Cardizem with IV hydration antibiotics   ED Course: 71 year old female presents emergency department concern for fever and tachycardia.  Patient is febrile on arrival.  Has a sacral wound that has a foul odor, most likely source of infection.  Flu and COVID are negative, chest is clear, we are still pending urinalysis.  She has leukopenia/thrombocytopenia and initial lactic is 2.9.  First troponin is slightly elevated.  When given adenosine patient  had a break in her rhythm.  No flutter waves noted, she quickly went back into SVT.  Requiring Cardizem drip, slowly titrating due to blood pressure tolerance.  Patient was given antibiotics and septic protocol IV hydration.  Hemodynamics have significantly improved.  Given her complicated picture ICU was consulted and they encourage hospitalist admission.  Patient has been on the drip and is transitioning more to a sinus rhythm and tachycardia has decreased to the low 100s, blood pressure remained stable.   Critical Interventions: IV fluids, adenosine, IV antiarrhythmics, antibiotics for sepsis, ICU consult   Cardiac Monitoring: The patient was maintained on a cardiac monitor.  I personally viewed and interpreted the cardiac monitored which showed an underlying rhythm of: Tachycardia   Reevaluation: After the interventions noted above, I reevaluated the patient and found that they have :improved   Dispostion: Patients evaluation and results requires admission for further treatment and care.  Spoke with hospitalist, reviewed patient's ED course and they accept admission.  Patient agrees with admission plan, offers no new complaints and is stable/unchanged at time of admit.        Final Clinical Impression(s) / ED Diagnoses Final diagnoses:  None    Rx / DC Orders ED Discharge Orders     None         Lorelle Gibbs, DO 06/05/2021 1434

## 2021-06-28 NOTE — ED Notes (Signed)
Daughter [name] updated on care

## 2021-06-29 ENCOUNTER — Inpatient Hospital Stay (HOSPITAL_COMMUNITY): Payer: Medicare HMO

## 2021-06-29 DIAGNOSIS — D709 Neutropenia, unspecified: Secondary | ICD-10-CM | POA: Diagnosis not present

## 2021-06-29 DIAGNOSIS — R5081 Fever presenting with conditions classified elsewhere: Secondary | ICD-10-CM | POA: Diagnosis not present

## 2021-06-29 DIAGNOSIS — I471 Supraventricular tachycardia: Secondary | ICD-10-CM | POA: Diagnosis not present

## 2021-06-29 LAB — RENAL FUNCTION PANEL
Albumin: 1.8 g/dL — ABNORMAL LOW (ref 3.5–5.0)
Anion gap: 11 (ref 5–15)
BUN: 33 mg/dL — ABNORMAL HIGH (ref 8–23)
CO2: 16 mmol/L — ABNORMAL LOW (ref 22–32)
Calcium: 7 mg/dL — ABNORMAL LOW (ref 8.9–10.3)
Chloride: 100 mmol/L (ref 98–111)
Creatinine, Ser: 1.42 mg/dL — ABNORMAL HIGH (ref 0.44–1.00)
GFR, Estimated: 40 mL/min — ABNORMAL LOW (ref >60)
Glucose, Bld: 238 mg/dL — ABNORMAL HIGH (ref 70–99)
Phosphorus: 3.3 mg/dL (ref 2.5–4.6)
Potassium: 3.5 mmol/L (ref 3.5–5.1)
Sodium: 127 mmol/L — ABNORMAL LOW (ref 135–145)

## 2021-06-29 LAB — CBC
HCT: 21.8 % — ABNORMAL LOW (ref 36.0–46.0)
Hemoglobin: 6.6 g/dL — CL (ref 12.0–15.0)
MCH: 26.8 pg (ref 26.0–34.0)
MCHC: 30.3 g/dL (ref 30.0–36.0)
MCV: 88.6 fL (ref 80.0–100.0)
Platelets: 96 10*3/uL — ABNORMAL LOW (ref 150–400)
RBC: 2.46 MIL/uL — ABNORMAL LOW (ref 3.87–5.11)
RDW: 15.7 % — ABNORMAL HIGH (ref 11.5–15.5)
WBC: 1.6 10*3/uL — ABNORMAL LOW (ref 4.0–10.5)
nRBC: 8.2 % — ABNORMAL HIGH (ref 0.0–0.2)

## 2021-06-29 LAB — HEMOGLOBIN AND HEMATOCRIT, BLOOD
HCT: 22 % — ABNORMAL LOW (ref 36.0–46.0)
Hemoglobin: 7 g/dL — ABNORMAL LOW (ref 12.0–15.0)

## 2021-06-29 LAB — ECHOCARDIOGRAM COMPLETE
Height: 63 in
S' Lateral: 1.8 cm
Weight: 4077.63 oz

## 2021-06-29 LAB — LIPID PANEL
Cholesterol: 66 mg/dL (ref 0–200)
HDL: 14 mg/dL — ABNORMAL LOW (ref >40)
LDL Cholesterol: 30 mg/dL (ref 0–99)
Total CHOL/HDL Ratio: 4.7 RATIO
Triglycerides: 110 mg/dL (ref <150)
VLDL: 22 mg/dL (ref 0–40)

## 2021-06-29 LAB — MRSA NEXT GEN BY PCR, NASAL: MRSA by PCR Next Gen: DETECTED — AB

## 2021-06-29 LAB — GLUCOSE, CAPILLARY: Glucose-Capillary: 187 mg/dL — ABNORMAL HIGH (ref 70–99)

## 2021-06-29 LAB — LACTIC ACID, PLASMA
Lactic Acid, Venous: 4.1 mmol/L (ref 0.5–1.9)
Lactic Acid, Venous: 5 mmol/L (ref 0.5–1.9)

## 2021-06-29 LAB — HEMOGLOBIN A1C
Hgb A1c MFr Bld: 5.4 % (ref 4.8–5.6)
Mean Plasma Glucose: 108.28 mg/dL

## 2021-06-29 MED ORDER — MUPIROCIN 2 % EX OINT
1.0000 "application " | TOPICAL_OINTMENT | Freq: Two times a day (BID) | CUTANEOUS | Status: DC
  Administered 2021-06-29 (×2): 1 via NASAL
  Filled 2021-06-29: qty 22

## 2021-06-29 MED ORDER — ALUM & MAG HYDROXIDE-SIMETH 200-200-20 MG/5ML PO SUSP
30.0000 mL | ORAL | Status: DC | PRN
  Administered 2021-06-29: 30 mL via ORAL

## 2021-06-29 MED ORDER — ASCORBIC ACID 500 MG PO TABS
250.0000 mg | ORAL_TABLET | Freq: Every day | ORAL | Status: DC
  Administered 2021-06-29: 250 mg via ORAL
  Filled 2021-06-29: qty 1

## 2021-06-30 ENCOUNTER — Inpatient Hospital Stay (HOSPITAL_COMMUNITY): Admission: RE | Admit: 2021-06-30 | Payer: Medicare HMO | Source: Ambulatory Visit

## 2021-06-30 ENCOUNTER — Ambulatory Visit (HOSPITAL_COMMUNITY): Payer: Medicare HMO

## 2021-07-01 LAB — URINE CULTURE: Culture: >=100000 — AB

## 2021-07-01 NOTE — Evaluation (Signed)
Physical Therapy Evaluation Patient Details Name: Crystal Vega MRN: [id number] DOB: [birth year] Today's Date: 07/03/21  History of Present Illness  71 year old female past medical history of obesity, HTN, HLD, DM, metastatic breast cancer, previous DVT and a flutter anticoagulated on Eliquis presents to the emergency department from [facility] with concern for tachycardia and weakness.  History limited secondary to acuity.  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   Clinical Impression  Patient demonstrates slow labored movement for sitting up at bedside requiring Max assist with limited use of LUE due to baseline weakness, once seated tend to lean to the left, able to keep trunk in midline after a few minutes, and limited for sitting up mostly due to c/o dizziness - RN notified.  Patient put back to bed after therapy.  Patient will benefit from continued skilled physical therapy in hospital and recommended venue below to increase strength, balance, endurance for safe ADLs and gait.         Recommendations for follow up therapy are one component of a multi-disciplinary discharge planning process, led by the attending physician.  Recommendations may be updated based on patient status, additional functional criteria and insurance authorization.  Follow Up Recommendations Skilled nursing-short term rehab (<3 hours/day)    Assistance Recommended at Discharge Intermittent Supervision/Assistance  Patient can return home with the following  A lot of help with bathing/dressing/bathroom;Two people to help with bathing/dressing/bathroom;Two people to help with walking and/or transfers;Help with stairs or ramp for entrance    Equipment Recommendations None recommended by PT  Recommendations for Other Services       Functional Status Assessment Patient has had a recent decline in their functional status and demonstrates the ability to make significant  improvements in function in a reasonable and predictable amount of time.     Precautions / Restrictions Precautions Precautions: Fall Restrictions Weight Bearing Restrictions: No      Mobility  Bed Mobility Overal bed mobility: Needs Assistance Bed Mobility: Supine to Sit, Sit to Supine     Supine to sit: Max assist, +2 for physical assistance, +2 for safety/equipment, HOB elevated Sit to supine: Max assist, +2 for physical assistance, +2 for safety/equipment   General bed mobility comments: slow labored movement with limited use of LUE due to baseline weakness    Transfers                        Ambulation/Gait                  Stairs            Wheelchair Mobility    Modified Rankin (Stroke Patients Only)       Balance Overall balance assessment: Needs assistance Sitting-balance support: Feet supported, No upper extremity supported Sitting balance-Leahy Scale: Fair Sitting balance - Comments: fair/poor seated at EOB Postural control: Left lateral lean                                   Pertinent Vitals/Pain Pain Assessment Pain Assessment: No/denies pain    Home Living Family/patient expects to be discharged to:: Private residence Living Arrangements: Spouse/significant other Available Help at Discharge: Family;Available PRN/intermittently Type of Home: House Home Access: Stairs to enter Entrance Stairs-Rails: None Entrance Stairs-Number of Steps: 1-2   Home Layout: One level Home Equipment: Conservation officer, nature (2 wheels);Cane - single  point;Grab bars - tub/shower      Prior Function Prior Level of Function : Needs assist  Cognitive Assist : ADLs (cognitive)   ADLs (Cognitive): Set up cues Physical Assist : Mobility (physical);ADLs (physical) Mobility (physical): Bed mobility;Transfers;Gait ADLs (physical): Bathing;Dressing;Toileting;Grooming;IADLs;Feeding Mobility Comments: Was ambulatory before last admission,  since assisted for sitting up at bedside ADLs Comments: Has been receiving assistance with all ADL tasks since her recent decline in the past few months where she was admitted to SNF for rehab.     Hand Dominance   Dominant Hand: Right    Extremity/Trunk Assessment   Upper Extremity Assessment Upper Extremity Assessment: Defer to OT evaluation LUE Deficits / Details: Unable to use her left arm functionally in the past 3 years. 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 She is able to slightly abducted her left shoulder when attempting to push herself to the left side of the bed. Able to demonstrate active elbow flexion and extension to a functional range. No use of her hand or wrist. Hand remains in a fisted postion and wrist is fully flexed at rest. Able to passibly move hand and wrist. LUE Sensation: WNL LUE Coordination: decreased fine motor;decreased gross motor    Lower Extremity Assessment Lower Extremity Assessment: Generalized weakness    Cervical / Trunk Assessment Cervical / Trunk Assessment: Normal  Communication   Communication: No difficulties;Other (comment)  Cognition Arousal/Alertness: Awake/alert Behavior During Therapy: WFL for tasks assessed/performed Overall Cognitive Status: Within Functional Limits for tasks assessed                                          General Comments      Exercises     Assessment/Plan    PT Assessment Patient needs continued PT services  PT Problem List Decreased strength;Decreased activity tolerance;Decreased balance;Decreased mobility       PT Treatment Interventions DME instruction;Gait training;Functional mobility training;Therapeutic activities;Manual techniques;Balance training;Patient/family education    PT Goals (Current goals can be found in the Care Plan section)  Acute Rehab PT Goals Patient Stated Goal: return  home PT Goal Formulation: With  patient Time For Goal Achievement: 07/13/21 Potential to Achieve Goals: Fair    Frequency Min 3X/week     Co-evaluation PT/OT/SLP Co-Evaluation/Treatment: Yes Reason for Co-Treatment: Complexity of the patient's impairments (multi-system involvement);To address functional/ADL transfers PT goals addressed during session: Mobility/safety with mobility;Balance;Proper use of DME OT goals addressed during session: Strengthening/ROM       AM-PAC PT "6 Clicks" Mobility  Outcome Measure Help needed turning from your back to your side while in a flat bed without using bedrails?: A Lot Help needed moving from lying on your back to sitting on the side of a flat bed without using bedrails?: A Lot Help needed moving to and from a bed to a chair (including a wheelchair)?: Total Help needed standing up from a chair using your arms (e.g., wheelchair or bedside chair)?: Total Help needed to walk in hospital room?: Total Help needed climbing 3-5 steps with a railing? : Total 6 Click Score: 8    End of Session   Activity Tolerance: Patient tolerated treatment well;Patient limited by fatigue Patient left: in bed;with call bell/phone within reach Nurse Communication: Mobility status PT Visit Diagnosis: Unsteadiness on feet (R26.81);Other abnormalities of gait and mobility (R26.89);Muscle weakness (generalized) (M62.81)  Time: 4854-6270 PT Time Calculation (min) (ACUTE ONLY): 24 min   Charges:   PT Evaluation $PT Eval Moderate Complexity: 1 Mod PT Treatments $Therapeutic Activity: 23-37 mins        2:06 PM, July 14, 2021 Lonell Grandchild, MPT Physical Therapist with [facility] [phone] office [phone] mobile phone

## 2021-07-01 NOTE — Progress Notes (Signed)
°  Please see full H&P from 06/17/2021  - Patient was admitted on 05/31/2021 due to SVT requiring IV Cardizem for rate control, pancytopenia with neutropenic fevers/SIRS requiring IV antibiotics pending culture data -Dehydration and electrolyte derangements requiring IV replacements  -- After 7 PM on 06/12/2021 patient apparently became more weak with concerns of worsening left-sided weakness and possible stroke and speech concerns,  - Dr. Hal Hope ordered code stroke and neurology consult yesterday evening - -CT head was negative, PTA patient was on Eliquis -IV fluids were continued due to concern for sepsis and persistently elevated lactic acid levels as well as episodes of hypotension especially while on Cardizem drip - - Patient was seen and evaluated by me this AM with physical therapy and Occupational Therapy at bedside with me -Please see separate documentation from physical and Occupational Therapy providers who worked with patient while I was in the room - MRI brain was scheduled but patient told me and the RN this morning that she did not want MRI brain for further stroke work-up- - Patient confirmed her DNR status and indicated that she did not want heroic measures - -Patient also confirmed to me and the OT and PT providers at bedside that her left-sided weakness is not new, patient indicated that she is left-hand dominant, but has not been able to use her left upper extremity for over a year - Patient confirmed that her level of functioning at SNF facility was ability to sit on the side of bed with assist otherwise she was not really able to do much from a mobility standpoint - I got paged stat to patient bedside because RN was concerned that patient had apnea and bradycardia down to 40s, iv cardizem drip was dced  -I Arrived quickly at the bedside to find patient apneic and in asystole -Phone call to patient's daughter from bedside-daughter verbalizes understanding of patient's  wishes for DNR status and no heroic measures  Time of death 10:42 am  Total care time 52 minutes  Roxan Hockey, MD

## 2021-07-01 NOTE — Progress Notes (Incomplete)
*  PRELIMINARY RESULTS* Echocardiogram 2D Echocardiogram has been performed.  Crystal Vega 07/13/21, 9:22 AM

## 2021-07-01 NOTE — Progress Notes (Signed)
Lactic Acid trending up to 5, previously 3.7  Dr. Josephine Cables notified.  LA orders already in place to continue trending.   Pt received 2 L in ED prior to coming to unit.

## 2021-07-01 NOTE — Progress Notes (Signed)
Patient noted to be bradycardic with heart rate ranging lower 50s to 40s. This RN and another RN to bedside to assess and immediately stop cardizem drip. At that time, patient was noted to be apneic with HR still decreasing. Dr. Denton Brick called to bedside. Time of death called at 1042. Family notified.

## 2021-07-01 NOTE — NC FL2 (Deleted)
[place] LEVEL OF CARE SCREENING TOOL     IDENTIFICATION  Patient Name: Crystal Vega Birthdate: February 02, 1951 Sex: female Admission Date (Current Location): 06/27/2021  [facility] and [place] Number:  [employer] and Address:  [facility] [address] [place]      Provider Number: [id number]  Attending Physician Name and Address:  Roxan Hockey, MD  Relative Name and Phone Number:       Current Level of Care: Hospital Recommended Level of Care: [place] Prior Approval Number:    Date Approved/Denied:   PASRR Number: [id number]  Discharge Plan: SNF    Current Diagnoses: Patient Active Problem List   Diagnosis Date Noted   Neutropenic fever ([place]) 06/07/2021   SVT (supraventricular tachycardia) ([place]) 06/09/2021   Hip pain    Acute pain of left knee    Severe sepsis with septic shock (HCC)    Postmenopausal vaginal bleeding    Hypovolemic shock ([place]) 05/16/2021   COVID-19    Symptomatic anemia    Vaginal bleeding    AKI (acute kidney injury) ([place])    Hyperglycemia due to diabetes mellitus ([place])    Gastrointestinal hemorrhage with melena    Circulatory failure ([place]) 05/15/2021   E coli bacteremia 05/04/2021   Pressure injury of skin 04/28/2021   Proteus Mirabilis and Klebsiella UTI (urinary tract infection) 04/28/2021   Fall 04/27/2021   Tachycardia 04/27/2021   Thrombocytopenia ([place]) 04/27/2021   Chronic anemia 04/27/2021   Drug-induced neutropenia (HCC) 02/03/2021   Iron deficiency anemia 02/03/2021   Nausea without vomiting 11/12/2020   Rectal bleeding 08/06/2020   Left leg DVT ([place]) 03/03/2020   Family history of GI tract cancer    Family history of cancer of female genital organ    Port-A-Cath in place 12/25/2019   Colonic mass 12/19/2019   Goals of care, counseling/discussion 12/17/2019   Malignant neoplasm of overlapping sites of left breast in female, estrogen receptor positive  ([place]) 11/28/2019   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 ([place]) 11/28/2019   Heme positive stool 02/27/2018   FH: colon cancer 02/27/2018   Constipation 02/27/2018   Simple endometrial 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 04/16/2014   Atypical chest pain 10/10/2013   Essential hypertension, benign 10/10/2013   Morbid obesity ([place]) 10/10/2013   Type 2 diabetes mellitus ([place]) 10/10/2013    Orientation RESPIRATION BLADDER Height & Weight     Self, Time, Situation, Place    Incontinent, External catheter Weight: 254 lb 13.6 oz (115.6 kg) Height:  5\' 3"  (160 cm)  BEHAVIORAL SYMPTOMS/MOOD NEUROLOGICAL BOWEL NUTRITION STATUS      Continent Diet  AMBULATORY STATUS COMMUNICATION OF NEEDS Skin   Extensive Assist Verbally Other (Comment) (Sacrum left stage 3, buttocks left stage 3, buttocks right stage 3, heel right deep tissue pressure injury.)                       Personal Care Assistance Level of Assistance  Bathing, Feeding, Dressing, Total care Bathing Assistance: Maximum assistance Feeding assistance: Independent Dressing Assistance: Maximum assistance Total Care Assistance: Maximum assistance   Functional Limitations Info  Sight, Hearing, Speech Sight Info: Adequate Hearing Info: Adequate Speech Info: Adequate    SPECIAL CARE FACTORS FREQUENCY  PT (By licensed PT), OT (By licensed OT)     PT Frequency: 5 times weekly OT Frequency: 5 times weekly            Contractures Contractures Info:  Not present    Additional Factors Info  Code Status, Allergies Code Status Info: DNR Allergies Info: NKA           Current Medications (10-Jul-2021):  This is the current hospital active medication list Current Facility-Administered Medications  Medication Dose Route Frequency Provider Last Rate Last Admin    stroke: mapping our early stages of recovery book   Does not apply Once Rise Patience, MD       0.9 %  sodium chloride infusion  250 mL Intravenous PRN  Roxan Hockey, MD   Stopped at 2021/07/10 0014   0.9 % NaCl with KCl 40 mEq / L  infusion   Intravenous Continuous Emokpae, Courage, MD 100 mL/hr at July 10, 2021 1028 Infusion Verify at 07-10-2021 1028   acetaminophen (TYLENOL) tablet 650 mg  650 mg Oral Q6H PRN Roxan Hockey, MD       Or   acetaminophen (TYLENOL) suppository 650 mg  650 mg Rectal Q6H PRN Emokpae, Courage, MD       alum & mag hydroxide-simeth (MAALOX/MYLANTA) 200-200-20 MG/5ML suspension 30 mL  30 mL Oral Q4H PRN Emokpae, Courage, MD   30 mL at 07/10/21 0407   apixaban (ELIQUIS) tablet 5 mg  5 mg Oral BID Emokpae, Courage, MD   5 mg at 2021/07/10 0258   ascorbic acid (VITAMIN C) tablet 250 mg  250 mg Oral Daily Emokpae, Courage, MD   250 mg at 07/10/2021 0956   bisacodyl (DULCOLAX) suppository 10 mg  10 mg Rectal Daily PRN Emokpae, Courage, MD       cefTRIAXone (ROCEPHIN) 2 g in sodium chloride 0.9 % 100 mL IVPB  2 g Intravenous Q24H Emokpae, Courage, MD   Stopped at Jul 10, 2021 5277   Chlorhexidine Gluconate Cloth 2 % PADS 6 each  6 each Topical Q0600 Adefeso, Oladapo, DO   6 each at 06/22/2021 2317   diltiazem (CARDIZEM) 125 mg in dextrose 5% 125 mL (1 mg/mL) infusion  5-15 mg/hr Intravenous Continuous Emokpae, Courage, MD 7.5 mL/hr at 10-Jul-2021 1028 7.5 mg/hr at 07-10-21 1028   feeding supplement (ENSURE ENLIVE / ENSURE PLUS) liquid 237 mL  237 mL Oral Daily Emokpae, Courage, MD   237 mL at 07-10-2021 0958   fentaNYL (DURAGESIC) 50 MCG/HR 1 patch  1 patch Transdermal Q72H Emokpae, Courage, MD   1 patch at July 10, 2021 0958   ferrous sulfate tablet 325 mg  325 mg Oral Q breakfast Emokpae, Courage, MD   325 mg at 82/42/35 3614   folic acid (FOLVITE) tablet 1 mg  1 mg Oral Daily Emokpae, Courage, MD   1 mg at 07-10-2021 0955   gabapentin (NEURONTIN) capsule 300 mg  300 mg Oral BID Emokpae, Courage, MD   300 mg at 07/10/2021 0955   HYDROcodone-acetaminophen (NORCO/VICODIN) 5-325 MG per tablet 1 tablet  1 tablet Oral Q4H PRN Roxan Hockey, MD   1 tablet  at 07-10-21 0955   insulin aspart (novoLOG) injection 0-5 Units  0-5 Units Subcutaneous QHS Emokpae, Courage, MD       insulin aspart (novoLOG) injection 0-9 Units  0-9 Units Subcutaneous TID WC Emokpae, Courage, MD   2 Units at 2021-07-10 0957   magnesium oxide (MAG-OX) tablet 800 mg  800 mg Oral BID Emokpae, Courage, MD   800 mg at 07-10-2021 0956   megestrol (MEGACE) tablet 80 mg  80 mg Oral Q8H Emokpae, Courage, MD   80 mg at 07/10/21 0545   metoprolol succinate (TOPROL-XL) 24 hr tablet 25 mg  25  mg Oral Daily Roxan Hockey, MD   25 mg at 2021-07-05 0956   multivitamin with minerals tablet 1 tablet  1 tablet Oral Daily Roxan Hockey, MD   1 tablet at 07/05/2021 0956   mupirocin ointment (BACTROBAN) 2 % 1 application  1 application Nasal BID Adefeso, Oladapo, DO   1 application at 16/60/63 0957   nutrition supplement (JUVEN) (JUVEN) powder packet 1 packet  1 packet Oral BID BM Emokpae, Courage, MD   1 packet at 07/05/21 0957   ondansetron (ZOFRAN) tablet 4 mg  4 mg Oral Q6H PRN Roxan Hockey, MD       Or   ondansetron (ZOFRAN) injection 4 mg  4 mg Intravenous Q6H PRN Emokpae, Courage, MD   4 mg at 07-05-2021 0101   pantoprazole (PROTONIX) EC tablet 40 mg  40 mg Oral Daily Emokpae, Courage, MD   40 mg at 07/05/2021 0955   polyethylene glycol (MIRALAX / GLYCOLAX) packet 17 g  17 g Oral BID Emokpae, Courage, MD   17 g at Jul 05, 2021 0957   polyethylene glycol (MIRALAX / GLYCOLAX) packet 17 g  17 g Oral Daily PRN Emokpae, Courage, MD       potassium chloride SA (KLOR-CON M) CR tablet 10 mEq  10 mEq Oral Daily Emokpae, Courage, MD   10 mEq at 07-05-2021 0954   senna-docusate (Senokot-S) tablet 2 tablet  2 tablet Oral QHS Emokpae, Courage, MD       sodium chloride flush (NS) 0.9 % injection 3 mL  3 mL Intravenous Q12H Emokpae, Courage, MD       sodium chloride flush (NS) 0.9 % injection 3 mL  3 mL Intravenous Q12H Emokpae, Courage, MD   3 mL at 2021-07-05 0958   sodium chloride flush (NS) 0.9 % injection 3 mL   3 mL Intravenous PRN Emokpae, Courage, MD       traZODone (DESYREL) tablet 50 mg  50 mg Oral QHS PRN Emokpae, Courage, MD       vancomycin (VANCOREADY) IVPB 1250 mg/250 mL  1,250 mg Intravenous Q24H Roxan Hockey, MD         Discharge Medications: Please see discharge summary for a list of discharge medications.  Relevant Imaging Results:  Relevant Lab Results:   Additional Information SSN: [id number] [place]

## 2021-07-01 NOTE — Discharge Summary (Signed)
°  Patient was admitted on 06/22/2021 due to SVT requiring IV Cardizem for rate control, pancytopenia with neutropenic fevers/SIRS requiring IV antibiotics pending culture data -Dehydration and electrolyte derangements requiring IV replacements   Please see death summary  Roxan Hockey, MD

## 2021-07-01 NOTE — Progress Notes (Signed)
Oncology Discharge Planning Admission Note  [facility] Address: [address] [place], [place] [address] Hours of Operation:  8am - 5pm, Monday - Friday  Clinic Contact Information:  [phone]  Oncology Care Team: Medical Oncologist:  [name]  Dr. Delton Coombes is aware of this hospital admission dated 06/17/2021.  The cancer center will follow Crystal Vega inpatient care to assist with discharge planning as indicated by the oncologist.     Disclaimer:  This [place] note does not imply a formal consult request has been made by the admitting attending for this admission or there will be an inpatient consult completed by oncology.  Please request oncology consults as per standard process as indicated.

## 2021-07-01 NOTE — Consult Note (Signed)
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 Partial and full thickness skin loss to left and right medial buttocks.  Photos of these wounds were provided on admission. See the Media Tab of the EMR for supplemental information. Wound type:Pressure Pressure Injury POA: Yes Measurements: Per Bedside RN on admission Sacrum, Stage 3:  3cm x 2.5cm x 0.3cm Right buttock, Stage 3:  6.5cm x 3cm x 0.5cm Left ischial tuberosity: 4cm x 0.5cm with thin layer of yellow slough over pink tissue. There are full and partial thickness areas of skin loss on buttocks. This is suspect to be related to adhesives securing the other buttock dressings. (Red, moist) Wound bed: Red, moist.  Small piece of nonviable tissue in the wound bed at right buttock. Drainage (amount, consistency, odor) Moderate serous Periwound: intact Dressing procedure/placement/frequency: I will provide the patient with a mattress replacement with low air loss feature and a pressure redistribution chair cushion for when she is OOB to a chair.  This is to accompany patient upon discharge for continued pressure redistribution therapy. Topical care will be with a silver hydrofiber (Aquacel Ag+ Advantage) applied daily after cleansing. This is to be secured with silicone foam dressings which will provide atraumatic dressing changes.   Side to side positioning while in bed and feet placed into Prevalon Boots for pressure redistribution are also indicated.  [place] nursing team will not follow, but will remain available to this patient, the nursing and medical teams.  Please re-consult if needed. Thanks, Maudie Flakes, MSN, RN, [place], Arther Abbott  Pager# [phone]

## 2021-07-01 NOTE — Progress Notes (Signed)
Recived call from Nurse that patient was actively dying. Arrived and found patient had passed with no family present. Chaplain provided prayer bedside with ICU team present. Chaplain also provided support to nurse and staff. Family decided not to view patient. Chaplain will remain available in order to provide spiritual support and to assess for spiritual need.   Rev. Bennie Pierini, M.Div. Chaplain  [phone]

## 2021-07-01 NOTE — Evaluation (Signed)
Occupational Therapy Evaluation Patient Details Name: Crystal Vega MRN: [id number] DOB: 1950-09-08 Today's Date: 07-11-21   History of Present Illness 71 year old female past medical history of obesity, HTN, HLD, DM, metastatic breast cancer, previous DVT and a flutter anticoagulated on Eliquis presents to the emergency department from [facility] with concern for tachycardia and weakness.  History limited secondary to acuity.  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   Clinical Impression   Patient in bed upon therapy arrival and hesitant regarding what she'll be able to complete with therapy for evaluation. Was able to transition patient to sitting on EOB with 2 person assist for safety and line management. Patient experienced dizziness upon sitting and alertness began to decrease. She was transitioned back to supine in bed which helped her become more alert and she was able to communicate with therapy and nursing staff. Nurse was called in and she assessed patient. Pt was repositioned in bed prior to therapy leaving. Recommend returning to SNF to continue with rehab. Patient has been bedbound for an extended amount of time and make need to work on transitioning from supine to sit more gradually to decrease any dizziness that may occur. OT will follow patient acutely.     Recommendations for follow up therapy are one component of a multi-disciplinary discharge planning process, led by the attending physician.  Recommendations may be updated based on patient status, additional functional criteria and insurance authorization.   Follow Up Recommendations  Skilled nursing-short term rehab (<3 hours/day)    Assistance Recommended at Discharge Frequent or constant Supervision/Assistance  Patient can return home with the following Two people to help with bathing/dressing/bathroom;Two people to help with walking and/or transfers;Assist for  transportation;Assistance with feeding;Assistance with cooking/housework;Direct supervision/assist for financial management;Direct supervision/assist for medications management    Functional Status Assessment  Patient has had a recent decline in their functional status and demonstrates the ability to make significant improvements in function in a reasonable and predictable amount of time.  Equipment Recommendations  None recommended by OT       Precautions / Restrictions Precautions Precautions: Fall Restrictions Weight Bearing Restrictions: No      Mobility Bed Mobility Overal bed mobility: Needs Assistance Bed Mobility: Supine to Sit, Sit to Supine     Supine to sit: Max assist, +2 for physical assistance, +2 for safety/equipment, HOB elevated Sit to supine: Max assist, +2 for physical assistance, +2 for safety/equipment        Transfers       General transfer comment: Unable to transfer out of bed this date.      Balance Overall balance assessment: Needs assistance   Sitting balance-Leahy Scale: Poor   Postural control: Left lateral lean Standing balance support: Single extremity supported, During functional activity     ADL either performed or assessed with clinical judgement   ADL Overall ADL's : Needs assistance/impaired Eating/Feeding: Maximal assistance;Bed level   Grooming: Wash/dry hands;Wash/dry face;Oral care;Applying deodorant;Brushing hair;Total assistance;Bed level   Upper Body Bathing: Maximal assistance;Bed level   Lower Body Bathing: Total assistance;Bed level   Upper Body Dressing : Maximal assistance;Bed level   Lower Body Dressing: Total assistance;Bed level     Toilet Transfer Details (indicate cue type and reason): Unable to transfer out of bed safely during evaluation. Toileting- Clothing Manipulation and Hygiene: Total assistance;Bed level       Functional mobility during ADLs: Maximal assistance;+2 for physical assistance;+2  for safety/equipment  Vision Baseline Vision/History: 0 No visual deficits Ability to See in Adequate Light:  (not tested) Patient Visual Report: No change from baseline              Pertinent Vitals/Pain Pain Assessment Pain Assessment: No/denies pain     Hand Dominance Right (Pt reports that she originally is left handed. Is unable to use her left hand/LUE in the past 3 years and is forced to use her right now.)   Extremity/Trunk Assessment Upper Extremity Assessment Upper Extremity Assessment: Generalized weakness;LUE deficits/detail LUE Deficits / Details: Unable to use her left arm functionally in the past 3 years. 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 She is able to slightly abducted her left shoulder when attempting to push herself to the left side of the bed. Able to demonstrate active elbow flexion and extension to a functional range. No use of her hand or wrist. Hand remains in a fisted postion and wrist is fully flexed at rest. Able to passibly move hand and wrist. LUE Sensation: WNL LUE Coordination: decreased fine motor;decreased gross motor   Lower Extremity Assessment Lower Extremity Assessment: Defer to PT evaluation       Communication Communication Communication: No difficulties;Other (comment) (Quiet voice at times and needs to repeat herself at times.)   Cognition Arousal/Alertness: Awake/alert Behavior During Therapy: Flat affect Overall Cognitive Status: Within Functional Limits for tasks assessed                    Home Living Family/patient expects to be discharged to:: Skilled nursing facility          Prior Functioning/Environment Prior Level of Function : Needs assist  Cognitive Assist : ADLs (cognitive)     Physical Assist : Mobility (physical);ADLs (physical)   ADLs (physical): Bathing;Dressing;Toileting;Grooming;IADLs;Feeding Mobility Comments: patient is from SNF where she was  discharged to last month for rehab. She has only been able to transition from supine in bed to seated on EOB wit therapy. ADLs Comments: Has been receiving assistance with all ADL tasks since her recent decline in the past few months where she was admitted to SNF for rehab.        OT Problem List: Decreased strength;Decreased coordination;Decreased range of motion;Decreased activity tolerance;Impaired balance (sitting and/or standing)      OT Treatment/Interventions: Self-care/ADL training;Therapeutic exercise;Therapeutic activities;Neuromuscular education;Energy conservation;DME and/or AE instruction;Patient/family education;Modalities;Balance training;Manual therapy    OT Goals(Current goals can be found in the care plan section) Acute Rehab OT Goals Patient Stated Goal: to get stronger OT Goal Formulation: With patient Time For Goal Achievement: 07/13/21 Potential to Achieve Goals: Fair  OT Frequency: Min 2X/week    Co-evaluation PT/OT/SLP Co-Evaluation/Treatment: Yes Reason for Co-Treatment: For patient/therapist safety;To address functional/ADL transfers;Complexity of the patient's impairments (multi-system involvement)   OT goals addressed during session: Strengthening/ROM      AM-PAC OT "6 Clicks" Daily Activity     Outcome Measure Help from another person eating meals?: A Lot Help from another person taking care of personal grooming?: A Lot Help from another person toileting, which includes using toliet, bedpan, or urinal?: Total Help from another person bathing (including washing, rinsing, drying)?: A Lot Help from another person to put on and taking off regular upper body clothing?: A Lot Help from another person to put on and taking off regular lower body clothing?: Total 6 Click Score: 10   End of Session Nurse Communication: Mobility status;Other (comment) (patient's response to sitting  on EOB = dizziness)  Activity Tolerance:   Patient left: in bed;with call  bell/phone within reach;with nursing/sitter in room  OT Visit Diagnosis: Muscle weakness (generalized) (M62.81)                Time: 2567-2091 OT Time Calculation (min): 16 min Charges:  OT General Charges $OT Visit: 1 Visit OT Evaluation $OT Eval High Complexity: 1 High  [facility], OTR/L,CBIS  [phone]   Iley Deignan, Clarene Duke July 10, 2021, 10:33 AM

## 2021-07-01 NOTE — Plan of Care (Signed)
°  Problem: Acute Rehab OT Goals (only OT should resolve) Goal: Pt. Will Perform Eating Flowsheets (Taken 07/11/21 1037) Pt Will Perform Eating:  with mod assist  sitting Goal: Pt. Will Perform Grooming Flowsheets (Taken July 11, 2021 1037) Pt Will Perform Grooming:  with mod assist  bed level Goal: Pt. Will Perform Upper Body Bathing Flowsheets (Taken July 11, 2021 1037) Pt Will Perform Upper Body Bathing:  with mod assist  sitting Goal: Pt. Will Perform Upper Body Dressing Flowsheets (Taken 11-Jul-2021 1037) Pt Will Perform Upper Body Dressing:  with mod assist  sitting Goal: Pt/Caregiver Will Perform Home Exercise Program Flowsheets (Taken 07-11-2021 1037) Pt/caregiver will Perform Home Exercise Program:  Increased strength  Right Upper extremity  With minimal assist  With written HEP provided Goal: OT Additional ADL Goal #1 Flowsheets (Taken July 11, 2021 1037) Additional ADL Goal #1: Patient will increase her static sitting tolerance to 10 minutes while completing a self care task when seated on the EOB with Min assist as needed for balance.

## 2021-07-01 NOTE — Progress Notes (Signed)
Patient's lactic acid now 4.1 from 5 on last check. Primary RN and MD made aware.

## 2021-07-01 NOTE — Death Summary Note (Signed)
DEATH SUMMARY   Patient Details  Name: Crystal Vega MRN: [id number] DOB: 1951-04-09  Admission/Discharge Information   Admit Date:  07-17-21  Date of Death: Date of Death: 2021/07/18  Time of Death: Time of Death: 09-01-1040  Length of Stay: 1  Referring Physician: Lemmie Evens, MD   Reason(s) for Hospitalization  Patient was admitted on Jul 17, 2021 due to SVT requiring IV Cardizem for rate control, pancytopenia with neutropenic fevers/SIRS requiring IV antibiotics pending culture data -Dehydration and electrolyte derangements requiring IV replacements  Diagnoses  Preliminary cause of death:  Secondary Diagnoses (including complications and co-morbidities):  Principal Problem:   Neutropenic fever ([place]) Active Problems:   SVT (supraventricular tachycardia) [facility])   Brief Hospital Course (including significant findings, care, treatment, and services provided and events leading to death)  Crystal Vega is a 71 y.o. 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),  DM2, HTN, colon mass, history of SVT/ tachyarrhythmias, iron deficiency anemia, h/o DVT who presents from [facility] with persistent tachycardia generalized weakness and poor appetite     Patient was admitted on Jul 17, 2021 due to SVT requiring IV Cardizem for rate control, pancytopenia with neutropenic fevers/SIRS requiring IV antibiotics pending culture data -Dehydration and electrolyte derangements requiring IV replacements  - -Please see full H&P from 07/17/2021 - After 7 PM on July 17, 2021 patient apparently became more weak with concerns of worsening left-sided weakness and possible stroke and speech concerns,  - Dr. Hal Vega ordered code stroke and neurology consult yesterday evening - -CT head was negative, PTA patient was on  Eliquis -IV fluids were continued due to concern for sepsis and persistently elevated lactic acid levels as well as episodes of hypotension especially while on Cardizem drip - Patient was seen and evaluated by me this AM with physical therapy and Occupational Therapy at bedside with me -Please see separate documentation from physical and Occupational Therapy providers who worked with patient while I was in the room - MRI brain was scheduled but patient told me and the RN this morning that she did not want MRI brain for further stroke work-up- - Patient confirmed her DNR status and indicated that she did not want heroic measures - -Patient also confirmed to me and the OT and PT providers at bedside that her left-sided weakness is not new, patient indicated that she is left-hand dominant, but has not been able to use her left upper extremity for over a year - Patient confirmed that her level of functioning at SNF facility was ability to sit on the side of bed with assist otherwise she was not really able to do much from a mobility standpoint - I got paged stat to patient bedside because RN was concerned that patient had apnea and bradycardia down to 40s, iv cardizem drip was dced  -I Arrived quickly at the bedside to find patient apneic and in asystole -Phone call to patient's daughter from bedside-daughter verbalizes understanding of patient's wishes for DNR status and no heroic measures   Time of death 10:42 am   Pertinent Labs and Studies  Significant Diagnostic Studies DG Chest Port 1 View  Result Date: July 17, 2021 CLINICAL DATA:  Questionable sepsis. EXAM: PORTABLE CHEST 1 VIEW COMPARISON:  May 16, 2021 FINDINGS: The heart size and mediastinal contours are within normal limits. Elevation of the right hemidiaphragm. Low lung volumes with bibasilar atelectasis. No  visible pleural effusion or pneumothorax. The visualized skeletal structures are unremarkable. IMPRESSION: Low lung volumes  with bibasilar atelectasis. Electronically Signed   By: Dahlia Bailiff M.D.   On: 05/31/2021 10:36   DG Chest Port 1V same Day  Result Date: 06/07/2021 CLINICAL DATA:  confirm central line placement. Hx of afib, HTN, breast cancer, diabetes, portacath, and former smoker. Negative covid-19 test today EXAM: PORTABLE CHEST 1 VIEW COMPARISON:  Chest x-ray 06/03/2021 10:15 a.m. FINDINGS: Interval placement of a right internal jugular central venous catheter with tip overlying the expected region of the distal superior vena cava. Cardiac paddles overlie the left chest. The heart and mediastinal contours are within normal limits. Low lung volumes. No focal consolidation. No pulmonary edema. No pleural effusion. No pneumothorax. No acute osseous abnormality. IMPRESSION: 1. Right internal jugular central venous catheter with tip overlying the expected region of the distal superior vena cava. 2. Low lung volumes. Electronically Signed   By: Iven Finn M.D.   On: 06/04/2021 21:54   ECHOCARDIOGRAM COMPLETE  Result Date: 2021/07/16    ECHOCARDIOGRAM REPORT   Patient Name:   ALAENA Vega Date of Exam: 16-Jul-2021 Medical Rec #:  [id number]       Height:       63.0 in Accession #:    [id number]      Weight:       254.9 lb Date of Birth:  09/12/1950       BSA:          2.143 m Patient Age:    32 years        BP:           120/54 mmHg Patient Gender: F               HR:           107 bpm. Exam Location:  [facility] Procedure: 2D Echo, Cardiac Doppler and Color Doppler Indications:    Stroke  History:        Patient has prior history of Echocardiogram examinations, most                 recent 04/30/2021. Stroke, Arrythmias:Tachycardia; Risk                 Factors:Hypertension and Diabetes. Breast CA.  Sonographer:    Wenda Low Referring Phys: [place]  Sonographer Comments: Patient is morbidly obese, Technically challenging study due to limited acoustic windows and no apical window. IMPRESSIONS  1.  Nondiagnostic study due to very poor visualization and limited acoustic windows.  2. Based on very limited views, the left ventricular ejection fraction appears normal at 60-65%. Left ventricular endocardial border not optimally defined to evaluate regional wall motion. There is mild-to-moderate concentric left ventricular hypertrophy. Left ventricular diastolic function could not be evaluated due to lack of apical windows.  3. Right ventricular systolic function was not well visualized. The right ventricular size is not well visualized. There is mildly elevated pulmonary artery systolic pressure.  4. The mitral valve was not well visualized.  5. The aortic valve is tricuspid. There is mild calcification of the aortic valve. There is mild thickening of the aortic valve. Aortic valve regurgitation is not visualized. Aortic valve sclerosis is present, with no evidence of aortic valve stenosis.  6. If clinically indicated to rule out cardiac source of embolism, consider TEE as current study is nondiagnostic.If Comparison(s): Compared to prior TTE in 04/2021, grossly there is no significant change  however current study limited due to poor echo windows. Conclusion(s)/Recommendation(s): No intracardiac source of embolism detected on this transthoracic study. Consider a transesophageal echocardiogram to exclude cardiac source of embolism if clinically indicated. FINDINGS  Left Ventricle: Left ventricular ejection fraction, by estimation, is 60 to 65%. The left ventricle has normal function. Left ventricular endocardial border not optimally defined to evaluate regional wall motion. The left ventricular internal cavity size was normal in size. There is mild concentric left ventricular hypertrophy. Left ventricular diastolic function could not be evaluated. Right Ventricle: The right ventricular size is not well visualized. Right vetricular wall thickness was not well visualized. Right ventricular systolic function was not  well visualized. There is mildly elevated pulmonary artery systolic pressure. The tricuspid regurgitant velocity is 2.77 m/s, and with an assumed right atrial pressure of 8 mmHg, the estimated right ventricular systolic pressure is 35.5 mmHg. Left Atrium: Left atrial size was not well visualized. Right Atrium: Right atrial size was not well visualized. Pericardium: The pericardium was not well visualized. Mitral Valve: The mitral valve was not well visualized. Tricuspid Valve: The tricuspid valve is normal in structure. Tricuspid valve regurgitation is trivial. Aortic Valve: The aortic valve is tricuspid. There is mild calcification of the aortic valve. There is mild thickening of the aortic valve. Aortic valve regurgitation is not visualized. Aortic valve sclerosis is present, with no evidence of aortic valve stenosis. Pulmonic Valve: The pulmonic valve was not well visualized. Pulmonic valve regurgitation is trivial. Aorta: The aortic root is normal in size and structure. IAS/Shunts: The interatrial septum was not well visualized.  LEFT VENTRICLE PLAX 2D LVIDd:         3.00 cm LVIDs:         1.80 cm LV PW:         0.90 cm LV IVS:        1.10 cm LVOT diam:     1.90 cm LVOT Area:     2.84 cm  LEFT ATRIUM         Index LA diam:    3.20 cm 1.49 cm/m                        PULMONIC VALVE AORTA                 PV Vmax:       1.00 m/s Ao Root diam: 3.00 cm PV Peak grad:  4.0 mmHg  TRICUSPID VALVE TR Peak grad:   30.7 mmHg TR Vmax:        277.00 cm/s  SHUNTS Systemic Diam: 1.90 cm Gwyndolyn Kaufman MD Electronically signed by Gwyndolyn Kaufman MD Signature Date/Time: 16-Jul-2021/10:21:39 AM    Final    CT HEAD CODE STROKE WO CONTRAST`  Result Date: 06/27/2021 CLINICAL DATA:  Code stroke.  Left-sided facial droop EXAM: CT HEAD WITHOUT CONTRAST TECHNIQUE: Contiguous axial images were obtained from the base of the skull through the vertex without intravenous contrast. RADIATION DOSE REDUCTION: This exam was performed  according to the departmental dose-optimization program which includes automated exposure control, adjustment of the mA and/or kV according to patient size and/or use of iterative reconstruction technique. COMPARISON:  None. FINDINGS: Brain: There is no mass, hemorrhage or extra-axial collection. The size and configuration of the ventricles and extra-axial CSF spaces are normal. The brain parenchyma is normal, without evidence of acute or chronic infarction. Vascular: No abnormal hyperdensity of the major intracranial arteries or dural venous sinuses. No intracranial atherosclerosis.  Skull: The visualized skull base, calvarium and extracranial soft tissues are normal. Sinuses/Orbits: No fluid levels or advanced mucosal thickening of the visualized paranasal sinuses. No mastoid or middle ear effusion. The orbits are normal. ASPECTS [facility] Stroke Program Early CT Score) - Ganglionic level infarction (caudate, lentiform nuclei, internal capsule, insula, M1-M3 cortex): 7 - Supraganglionic infarction (M4-M6 cortex): 3 Total score (0-10 with 10 being normal): 10 IMPRESSION: 1. No acute intracranial abnormality. 2. ASPECTS is 10. These results were called by telephone at the time of interpretation on 06/16/2021 at 8:12 pm to Dr. Regenia Skeeter, Who verbally acknowledged these results. Electronically Signed   By: Ulyses Jarred M.D.   On: 06/05/2021 20:12   CT ANGIO HEAD NECK W WO CM (CODE STROKE)  Result Date: 06/13/2021 CLINICAL DATA:  Left facial droop EXAM: CT ANGIOGRAPHY HEAD AND NECK TECHNIQUE: Multidetector CT imaging of the head and neck was performed using the standard protocol during bolus administration of intravenous contrast. Multiplanar CT image reconstructions and MIPs were obtained to evaluate the vascular anatomy. Carotid stenosis measurements (when applicable) are obtained utilizing NASCET criteria, using the distal internal carotid diameter as the denominator. RADIATION DOSE REDUCTION: This exam was  performed according to the departmental dose-optimization program which includes automated exposure control, adjustment of the mA and/or kV according to patient size and/or use of iterative reconstruction technique. CONTRAST:  5mL OMNIPAQUE IOHEXOL 350 MG/ML SOLN COMPARISON:  06/06/2021 head CT FINDINGS: CTA NECK FINDINGS SKELETON: There is no bony spinal canal stenosis. No lytic or blastic lesion. OTHER NECK: Normal pharynx, larynx and major salivary glands. No cervical lymphadenopathy. Unremarkable thyroid gland. UPPER CHEST: No pneumothorax or pleural effusion. No nodules or masses. AORTIC ARCH: There is no calcific atherosclerosis of the aortic arch. There is no aneurysm, dissection or hemodynamically significant stenosis of the visualized portion of the aorta. Conventional 3 vessel aortic branching pattern. The visualized proximal subclavian arteries are widely patent. RIGHT CAROTID SYSTEM: Normal without aneurysm, dissection or stenosis. LEFT CAROTID SYSTEM: Normal without aneurysm, dissection or stenosis. VERTEBRAL ARTERIES: Left dominant configuration. Both origins are clearly patent. There is no dissection, occlusion or flow-limiting stenosis to the skull base (V1-V3 segments). CTA HEAD FINDINGS POSTERIOR CIRCULATION: --Vertebral arteries: Normal V4 segments. --Inferior cerebellar arteries: Normal. --Basilar artery: Normal. --Superior cerebellar arteries: Normal. --Posterior cerebral arteries (PCA): Normal. ANTERIOR CIRCULATION: --Intracranial internal carotid arteries: Normal. --Anterior cerebral arteries (ACA): Normal. Both A1 segments are present. Patent anterior communicating artery (a-comm). --Middle cerebral arteries (MCA): Normal. VENOUS SINUSES: As permitted by contrast timing, patent. ANATOMIC VARIANTS: Fetal origin of the right posterior cerebral artery. Patent left P-comm. Review of the MIP images confirms the above findings. IMPRESSION: No emergent large vessel occlusion or high-grade stenosis  of the intracranial or cervical arteries. Electronically Signed   By: Ulyses Jarred M.D.   On: 06/03/2021 23:10    Microbiology Recent Results (from the past 240 hour(s))  Blood Culture (routine x 2)     Status: None (Preliminary result)   Collection Time: 06/15/2021 10:09 AM   Specimen: Right Antecubital; Blood  Result Value Ref Range Status   Specimen Description RIGHT ANTECUBITAL  Final   Special Requests   Final    BOTTLES DRAWN AEROBIC AND ANAEROBIC Blood Culture adequate volume Performed at [facility], [address], [place], [place] [address]    Culture PENDING  Incomplete   Report Status PENDING  Incomplete  Resp Panel by RT-PCR (Flu A&B, Covid) Nasopharyngeal Swab     Status: None   Collection Time:  06/17/2021 11:31 AM   Specimen: Nasopharyngeal Swab; Nasopharyngeal(NP) swabs in vial transport medium  Result Value Ref Range Status   SARS Coronavirus 2 by RT PCR NEGATIVE NEGATIVE Final    Comment: (NOTE) SARS-CoV-2 target nucleic acids are NOT DETECTED.  The SARS-CoV-2 RNA is generally detectable in upper respiratory specimens during the acute phase of infection. The lowest concentration of SARS-CoV-2 viral copies this assay can detect is 138 copies/mL. A negative result does not preclude SARS-Cov-2 infection and should not be used as the sole basis for treatment or other patient management decisions. A negative result may occur with  improper specimen collection/handling, submission of specimen other than nasopharyngeal swab, presence of viral mutation(s) within the areas targeted by this assay, and inadequate number of viral copies(<138 copies/mL). A negative result must be combined with clinical observations, patient history, and epidemiological information. The expected result is Negative.  Fact Sheet for Patients:  EntrepreneurPulse.com.au  Fact Sheet for Healthcare Providers:  IncredibleEmployment.be  This test is no t yet  approved or cleared by the [place] FDA and  has been authorized for detection and/or diagnosis of SARS-CoV-2 by FDA under an Emergency Use Authorization (EUA). This EUA will remain  in effect (meaning this test can be used) for the duration of the COVID-19 declaration under Section 564(b)(1) of the Act, 21 U.S.C.section 360bbb-3(b)(1), unless the authorization is terminated  or revoked sooner.       Influenza A by PCR NEGATIVE NEGATIVE Final   Influenza B by PCR NEGATIVE NEGATIVE Final    Comment: (NOTE) The Xpert Xpress SARS-CoV-2/FLU/RSV plus assay is intended as an aid in the diagnosis of influenza from Nasopharyngeal swab specimens and should not be used as a sole basis for treatment. Nasal washings and aspirates are unacceptable for Xpert Xpress SARS-CoV-2/FLU/RSV testing.  Fact Sheet for Patients: EntrepreneurPulse.com.au  Fact Sheet for Healthcare Providers: IncredibleEmployment.be  This test is not yet approved or cleared by the [place] FDA and has been authorized for detection and/or diagnosis of SARS-CoV-2 by FDA under an Emergency Use Authorization (EUA). This EUA will remain in effect (meaning this test can be used) for the duration of the COVID-19 declaration under Section 564(b)(1) of the Act, 21 U.S.C. section 360bbb-3(b)(1), unless the authorization is terminated or revoked.  Performed at [facility], [address] [place], [place] [address]   Blood Culture (routine x 2)     Status: None (Preliminary result)   Collection Time: 06/18/2021 12:55 PM   Specimen: BLOOD RIGHT HAND  Result Value Ref Range Status   Specimen Description BLOOD RIGHT HAND  Final   Special Requests   Final    BOTTLES DRAWN AEROBIC AND ANAEROBIC Blood Culture results may not be optimal due to an excessive volume of blood received in culture bottles Performed at [facility], [address] [place], [place] [address]    Culture PENDING   Incomplete   Report Status PENDING  Incomplete  MRSA Next Gen by PCR, Nasal     Status: Abnormal   Collection Time: 06/08/2021 11:13 PM   Specimen: Nasal Mucosa; Nasal Swab  Result Value Ref Range Status   MRSA by PCR Next Gen DETECTED (A) NOT DETECTED Final    Comment: RESULT CALLED TO, READ BACK BY AND VERIFIED WITH: MAYNARD,R @ 0208 ON July 07, 2021 BY JUW (NOTE) The GeneXpert MRSA Assay (FDA approved for NASAL specimens only), is one component of a comprehensive MRSA colonization surveillance program. It is not intended to diagnose MRSA infection nor to  guide or monitor treatment for MRSA infections. Test performance is not FDA approved in patients less than 9 years old. Performed at [facility], [address], [place], [place] [address]    Lab Basic Metabolic Panel: Recent Labs  Lab 06/09/2021 1009 07-15-2021 0300  NA 127* 127*  K 2.8* 3.5  CL 95* 100  CO2 20* 16*  GLUCOSE 175* 238*  BUN 28* 33*  CREATININE 1.21* 1.42*  CALCIUM 7.5* 7.0*  MG 1.3*  --   PHOS  --  3.3   Liver Function Tests: Recent Labs  Lab 06/02/2021 1009 07-15-21 0300  AST 30  --   ALT 14  --   ALKPHOS 98  --   BILITOT 1.0  --   PROT 6.7  --   ALBUMIN 2.2* 1.8*   No results for input(s): LIPASE, AMYLASE in the last 168 hours. No results for input(s): AMMONIA in the last 168 hours. CBC: Recent Labs  Lab 06/26/2021 1009 Jul 15, 2021 0300 07-15-2021 0539  WBC 0.9* 1.6*  --   NEUTROABS 0.5*  --   --   HGB 7.7* 6.6* 7.0*  HCT 23.6* 21.8* 22.0*  MCV 87.1 88.6  --   PLT 99* 96*  --    Cardiac Enzymes: No results for input(s): CKTOTAL, CKMB, CKMBINDEX, TROPONINI in the last 168 hours. Sepsis Labs: Recent Labs  Lab 06/03/2021 1009 06/04/2021 1255 06/10/2021 1433 06/24/2021 2333 07/15/21 0300  WBC 0.9*  --   --   --  1.6*  LATICACIDVEN  --  2.9* 3.7* 5.0* 4.1*   Procedures/Operations   Rt IJ central line  Samatha Anspach 07-15-2021, 1:08 PM

## 2021-07-01 DEATH — deceased

## 2021-07-02 ENCOUNTER — Encounter: Payer: Medicare HMO | Admitting: Obstetrics & Gynecology

## 2021-07-03 LAB — CULTURE, BLOOD (ROUTINE X 2)
Culture: NO GROWTH
Culture: NO GROWTH
Special Requests: ADEQUATE

## 2021-07-07 ENCOUNTER — Other Ambulatory Visit (HOSPITAL_COMMUNITY): Payer: Medicare HMO

## 2021-07-07 ENCOUNTER — Ambulatory Visit (HOSPITAL_COMMUNITY): Payer: Medicare HMO | Admitting: Hematology

## 2021-07-07 ENCOUNTER — Ambulatory Visit (HOSPITAL_COMMUNITY): Payer: Medicare HMO

## 2021-07-28 ENCOUNTER — Ambulatory Visit (HOSPITAL_COMMUNITY): Payer: Medicare HMO

## 2021-07-28 ENCOUNTER — Other Ambulatory Visit (HOSPITAL_COMMUNITY): Payer: Medicare HMO

## 2021-07-28 ENCOUNTER — Ambulatory Visit (HOSPITAL_COMMUNITY): Payer: Medicare HMO | Admitting: Hematology

## 2021-08-18 ENCOUNTER — Other Ambulatory Visit (HOSPITAL_COMMUNITY): Payer: Medicare HMO

## 2021-08-18 ENCOUNTER — Ambulatory Visit (HOSPITAL_COMMUNITY): Payer: Medicare HMO

## 2021-08-18 ENCOUNTER — Ambulatory Visit (HOSPITAL_COMMUNITY): Payer: Medicare HMO | Admitting: Hematology

## 2021-09-08 ENCOUNTER — Other Ambulatory Visit (HOSPITAL_COMMUNITY): Payer: Medicare HMO

## 2021-09-08 ENCOUNTER — Ambulatory Visit (HOSPITAL_COMMUNITY): Payer: Medicare HMO | Admitting: Hematology

## 2021-09-08 ENCOUNTER — Ambulatory Visit (HOSPITAL_COMMUNITY): Payer: Medicare HMO
